# Patient Record
Sex: Female | Born: 1945
Health system: Southern US, Community
[De-identification: ages and names within clinical notes are randomized; demographics above are authoritative.]

## PROBLEM LIST (undated history)

## (undated) DIAGNOSIS — G8929 Other chronic pain: Secondary | ICD-10-CM

## (undated) DIAGNOSIS — R351 Nocturia: Secondary | ICD-10-CM

## (undated) DIAGNOSIS — J189 Pneumonia, unspecified organism: Secondary | ICD-10-CM

## (undated) DIAGNOSIS — I10 Essential (primary) hypertension: Secondary | ICD-10-CM

## (undated) DIAGNOSIS — Z862 Personal history of diseases of the blood and blood-forming organs and certain disorders involving the immune mechanism: Secondary | ICD-10-CM

## (undated) DIAGNOSIS — M549 Dorsalgia, unspecified: Secondary | ICD-10-CM

## (undated) DIAGNOSIS — R238 Other skin changes: Secondary | ICD-10-CM

## (undated) DIAGNOSIS — R011 Cardiac murmur, unspecified: Secondary | ICD-10-CM

## (undated) DIAGNOSIS — Z9889 Other specified postprocedural states: Secondary | ICD-10-CM

## (undated) DIAGNOSIS — R233 Spontaneous ecchymoses: Secondary | ICD-10-CM

## (undated) DIAGNOSIS — G4700 Insomnia, unspecified: Secondary | ICD-10-CM

## (undated) DIAGNOSIS — C801 Malignant (primary) neoplasm, unspecified: Secondary | ICD-10-CM

## (undated) DIAGNOSIS — F32A Depression, unspecified: Secondary | ICD-10-CM

## (undated) DIAGNOSIS — N189 Chronic kidney disease, unspecified: Secondary | ICD-10-CM

## (undated) DIAGNOSIS — R350 Frequency of micturition: Secondary | ICD-10-CM

## (undated) DIAGNOSIS — J329 Chronic sinusitis, unspecified: Secondary | ICD-10-CM

## (undated) DIAGNOSIS — E039 Hypothyroidism, unspecified: Secondary | ICD-10-CM

## (undated) DIAGNOSIS — M2540 Effusion, unspecified joint: Secondary | ICD-10-CM

## (undated) DIAGNOSIS — R112 Nausea with vomiting, unspecified: Secondary | ICD-10-CM

## (undated) DIAGNOSIS — E785 Hyperlipidemia, unspecified: Secondary | ICD-10-CM

## (undated) DIAGNOSIS — M1990 Unspecified osteoarthritis, unspecified site: Secondary | ICD-10-CM

## (undated) DIAGNOSIS — I959 Hypotension, unspecified: Secondary | ICD-10-CM

## (undated) DIAGNOSIS — M2550 Pain in unspecified joint: Secondary | ICD-10-CM

## (undated) HISTORY — DX: Hypotension, unspecified: I95.9

## (undated) HISTORY — PX: ABDOMINAL HYSTERECTOMY: SHX81

## (undated) HISTORY — PX: KIDNEY TRANSPLANT: SHX239

## (undated) HISTORY — PX: COLONOSCOPY: SHX174

## (undated) HISTORY — DX: Malignant (primary) neoplasm, unspecified: C80.1

## (undated) HISTORY — PX: BREAST BIOPSY: SHX20

## (undated) HISTORY — PX: OTHER SURGICAL HISTORY: SHX169

## (undated) HISTORY — PX: BACK SURGERY: SHX140

---

## 1998-10-02 ENCOUNTER — Encounter: Admission: RE | Admit: 1998-10-02 | Discharge: 1998-12-31 | Payer: Self-pay

## 1999-05-30 ENCOUNTER — Other Ambulatory Visit: Admission: RE | Admit: 1999-05-30 | Discharge: 1999-05-30 | Payer: Self-pay | Admitting: Family Medicine

## 2001-03-17 ENCOUNTER — Other Ambulatory Visit: Admission: RE | Admit: 2001-03-17 | Discharge: 2001-03-17 | Payer: Self-pay | Admitting: Family Medicine

## 2002-11-14 ENCOUNTER — Encounter: Admission: RE | Admit: 2002-11-14 | Discharge: 2002-11-14 | Payer: Self-pay | Admitting: Rheumatology

## 2002-11-14 ENCOUNTER — Encounter: Payer: Self-pay | Admitting: Rheumatology

## 2003-01-06 ENCOUNTER — Encounter: Admission: RE | Admit: 2003-01-06 | Discharge: 2003-04-06 | Payer: Self-pay | Admitting: Rheumatology

## 2003-04-03 ENCOUNTER — Other Ambulatory Visit: Admission: RE | Admit: 2003-04-03 | Discharge: 2003-04-03 | Payer: Self-pay | Admitting: *Deleted

## 2003-06-02 ENCOUNTER — Encounter: Admission: RE | Admit: 2003-06-02 | Discharge: 2003-06-02 | Payer: Self-pay | Admitting: Neurosurgery

## 2003-06-02 ENCOUNTER — Encounter: Payer: Self-pay | Admitting: Diagnostic Radiology

## 2003-06-02 ENCOUNTER — Encounter: Payer: Self-pay | Admitting: Neurosurgery

## 2003-06-20 ENCOUNTER — Encounter: Admission: RE | Admit: 2003-06-20 | Discharge: 2003-06-20 | Payer: Self-pay | Admitting: Neurosurgery

## 2003-06-20 ENCOUNTER — Encounter: Payer: Self-pay | Admitting: Neurosurgery

## 2003-07-07 ENCOUNTER — Encounter: Admission: RE | Admit: 2003-07-07 | Discharge: 2003-07-07 | Payer: Self-pay | Admitting: Neurosurgery

## 2003-07-07 ENCOUNTER — Encounter: Payer: Self-pay | Admitting: Neurosurgery

## 2003-11-17 ENCOUNTER — Inpatient Hospital Stay (HOSPITAL_COMMUNITY): Admission: RE | Admit: 2003-11-17 | Discharge: 2003-11-20 | Payer: Self-pay | Admitting: Neurosurgery

## 2004-07-02 ENCOUNTER — Other Ambulatory Visit: Admission: RE | Admit: 2004-07-02 | Discharge: 2004-07-02 | Payer: Self-pay | Admitting: Family Medicine

## 2005-05-20 ENCOUNTER — Other Ambulatory Visit: Admission: RE | Admit: 2005-05-20 | Discharge: 2005-05-20 | Payer: Self-pay | Admitting: Family Medicine

## 2006-04-22 ENCOUNTER — Inpatient Hospital Stay (HOSPITAL_COMMUNITY): Admission: RE | Admit: 2006-04-22 | Discharge: 2006-04-25 | Payer: Self-pay | Admitting: Orthopedic Surgery

## 2006-04-23 ENCOUNTER — Ambulatory Visit: Payer: Self-pay | Admitting: Physical Medicine & Rehabilitation

## 2006-04-25 ENCOUNTER — Inpatient Hospital Stay (HOSPITAL_COMMUNITY)
Admission: AD | Admit: 2006-04-25 | Discharge: 2006-04-28 | Payer: Self-pay | Admitting: Physical Medicine & Rehabilitation

## 2006-05-18 ENCOUNTER — Encounter: Admission: RE | Admit: 2006-05-18 | Discharge: 2006-07-02 | Payer: Self-pay | Admitting: Orthopedic Surgery

## 2006-07-03 ENCOUNTER — Other Ambulatory Visit: Admission: RE | Admit: 2006-07-03 | Discharge: 2006-07-03 | Payer: Self-pay | Admitting: Family Medicine

## 2006-10-17 ENCOUNTER — Emergency Department (HOSPITAL_COMMUNITY): Admission: EM | Admit: 2006-10-17 | Discharge: 2006-10-17 | Payer: Self-pay | Admitting: Emergency Medicine

## 2012-06-11 ENCOUNTER — Other Ambulatory Visit: Payer: Self-pay | Admitting: Family Medicine

## 2012-06-11 DIAGNOSIS — G629 Polyneuropathy, unspecified: Secondary | ICD-10-CM

## 2012-06-16 ENCOUNTER — Other Ambulatory Visit: Payer: Self-pay

## 2012-06-16 ENCOUNTER — Ambulatory Visit
Admission: RE | Admit: 2012-06-16 | Discharge: 2012-06-16 | Disposition: A | Discharge status: Home / Self Care | Payer: Medicare HMO | Source: Ambulatory Visit | Attending: Family Medicine | Admitting: Family Medicine

## 2012-06-16 DIAGNOSIS — G629 Polyneuropathy, unspecified: Secondary | ICD-10-CM

## 2012-08-04 ENCOUNTER — Other Ambulatory Visit: Payer: Self-pay | Admitting: Neurosurgery

## 2012-08-05 ENCOUNTER — Encounter (HOSPITAL_COMMUNITY): Payer: Self-pay

## 2012-08-09 NOTE — Pre-Procedure Instructions (Signed)
[place]  08/09/2012   Your procedure is scheduled on:  Fri, Nov 22 @ 3:39 PM  Report to [facility] at 12:30 PM.  Call this number if you have problems the morning of surgery: [phone]   Remember:   Do not eat food:After Midnight.    Take these medicines the morning of surgery with A SIP OF WATER: Levothyroxine(Synthroid),Metoprolol(Toprol),and Eye Drops(if needed)   Do not wear jewelry, make-up or nail polish.  Do not wear lotions, powders, or perfumes. You may wear deodorant.  Do not shave 48 hours prior to surgery.  Do not bring valuables to the hospital.  Contacts, dentures or bridgework may not be worn into surgery.  Leave suitcase in the car. After surgery it may be brought to your room.  For patients admitted to the hospital, checkout time is 11:00 AM the day of discharge.   Patients discharged the day of surgery will not be allowed to drive home.    Special Instructions: Shower using CHG 2 nights before surgery and the night before surgery.  If you shower the day of surgery use CHG.  Use special wash - you have one bottle of CHG for all showers.  You should use approximately 1/3 of the bottle for each shower.   Please read over the following fact sheets that you were given: Pain Booklet, Coughing and Deep Breathing, MRSA Information and Surgical Site Infection Prevention

## 2012-08-10 ENCOUNTER — Encounter (HOSPITAL_COMMUNITY)
Admission: RE | Admit: 2012-08-10 | Discharge: 2012-08-10 | Disposition: A | Discharge status: Home / Self Care | Payer: Medicare PPO | Source: Ambulatory Visit | Attending: Neurosurgery | Admitting: Neurosurgery

## 2012-08-10 ENCOUNTER — Encounter (HOSPITAL_COMMUNITY)
Admission: RE | Admit: 2012-08-10 | Discharge: 2012-08-10 | Disposition: A | Discharge status: Home / Self Care | Payer: Medicare PPO | Source: Ambulatory Visit | Attending: Anesthesiology | Admitting: Anesthesiology

## 2012-08-10 ENCOUNTER — Encounter (HOSPITAL_COMMUNITY): Payer: Self-pay

## 2012-08-10 HISTORY — DX: Frequency of micturition: R35.0

## 2012-08-10 HISTORY — DX: Chronic sinusitis, unspecified: J32.9

## 2012-08-10 HISTORY — DX: Essential (primary) hypertension: I10

## 2012-08-10 HISTORY — DX: Hyperlipidemia, unspecified: E78.5

## 2012-08-10 HISTORY — DX: Pain in unspecified joint: M25.50

## 2012-08-10 HISTORY — DX: Unspecified osteoarthritis, unspecified site: M19.90

## 2012-08-10 HISTORY — DX: Hypothyroidism, unspecified: E03.9

## 2012-08-10 HISTORY — DX: Other chronic pain: G89.29

## 2012-08-10 HISTORY — DX: Nausea with vomiting, unspecified: R11.2

## 2012-08-10 HISTORY — DX: Nocturia: R35.1

## 2012-08-10 HISTORY — DX: Dorsalgia, unspecified: M54.9

## 2012-08-10 HISTORY — DX: Other specified postprocedural states: Z98.890

## 2012-08-10 HISTORY — DX: Spontaneous ecchymoses: R23.3

## 2012-08-10 HISTORY — DX: Other skin changes: R23.8

## 2012-08-10 HISTORY — DX: Effusion, unspecified joint: M25.40

## 2012-08-10 HISTORY — DX: Insomnia, unspecified: G47.00

## 2012-08-10 LAB — BASIC METABOLIC PANEL
CO2: 30 mEq/L (ref 19–32)
Glucose, Bld: 89 mg/dL (ref 70–99)
Potassium: 3.9 mEq/L (ref 3.5–5.1)
Sodium: 134 mEq/L — ABNORMAL LOW (ref 135–145)

## 2012-08-10 LAB — CBC
Hemoglobin: 13.9 g/dL (ref 12.0–15.0)
MCH: 29.8 pg (ref 26.0–34.0)
MCV: 86.3 fL (ref 78.0–100.0)
RBC: 4.67 MIL/uL (ref 3.87–5.11)

## 2012-08-10 NOTE — Progress Notes (Signed)
Pt doesn't have a cardiologist  Denies ever having an echo/heart cath/stress test  MEdical MD is with [place] NP   Denies CXR within past yr  Unsure if ekg done in dec12-to request from [employer]

## 2012-08-12 MED ORDER — CEFAZOLIN SODIUM-DEXTROSE 2-3 GM-% IV SOLR
2.0000 g | INTRAVENOUS | Status: DC
  Filled 2012-08-12: qty 50

## 2012-08-12 NOTE — H&P (Signed)
NEUROSURGICAL CONSULTATION   Jourden Hopkins  [id number]  DOB:  May 10, 1946  August 04, 2012   HISTORY:     I previously did surgery on Mrs. Pribble who underwent L3-4 and L4-5 TLIF fusion in February of 2005.  She now comes in retired but with a severe low back pain and left hip pain. She notes left lower quadrant pain radiating to her left buttock.  She says this began 04/24/2012.  She says it initially was severe and after two weeks of rest and now is a bit better.  She is walking at the [facility].  She said she picked something up and felt a "wham".  She does feel she is a bit better although she does note some weakness.  She has a history of hypertension, hypothyroidism, elevated cholesterol, history of skin cancer.    REVIEW OF SYSTEMS:   A detailed Review of Systems sheet was reviewed with the patient.  Pertinent positives include: Ears, nose, throat, mouth-she notes ringing in her hears, nasal congestion/drainage, sinus problems, sinus headaches; Cardiovascular-high blood pressure, high cholesterol;  Musculoskeletal-back pain; Integumentary-skin cancer; Endocrine-thyroid disease.  All other systems are negative; this includes Constitutional symptoms, Respiratory, Gastrointestinal, Genitourinary, Musculoskeletal, Breast, Neurologic, Psychiatric, Hematologic/Lymphatic, and Allergic/Immunologic.    PAST MEDICAL HISTORY:          Current Medical Conditions:    As previously described.           Prior Operations and Hospitalizations:   As previously described.        Medications and Allergies:   She is intolerant of strong pain medicine which she says she does not tolerate well.  She is taking Losartan/Hydrochlorothiazide 100-12.5 q.d., Metoprolol 100 mg. q.d., Synthroid 88 mg. q.d., Pravastatin 40 mg. q.d. and Amitriptyline 50 mg. q.h.s.         Height and Weight:     5' 6.5" tall, 155 pounds.    FAMILY HISTORY:    Mother is 1 with high blood pressure.       SOCIAL HISTORY:    She denies smoking,  alcoholic beverages or substance abuse.    DIAGNOSTIC STUDIES:   An MRI was done of her lumbar spine which was done through [facility] on 06/16/2012 which shows a prominent impingement of the L1-2, moderate impingement at L2-3, and mild impingement of L5-S1 levels.  At the L1-2 level, there is severe left  subarticular lateral recess stenosis along with a paracentral disc protrusion with facet hypertrophy causing significant left-sided nerve root compression.    PHYSICAL EXAMINATION:          General Appearance:   On examination today, Mrs. Asebedo is a pleasant and cooperative woman in no acute distress.           Blood Pressure, Pulse:     142/82, heart rate 70 and regular, respirations 18.        HEENT - normocephalic, atraumatic.  The pupils are equal, round and reactive to light.  The extraocular muscles are intact.  Sclerae - white.  Conjunctiva - pink.  Oropharynx benign.  Uvula midline.        Neck - there are no masses, meningismus, deformities, tracheal deviation, jugular vein distention or carotid bruits.  There is normal cervical range of motion.  Spurlings' test is negative without reproducible radicular pain turning the patient's head to either side.  Lhermitte's sign is not present with axial compression.         Respiratory - there is normal respiratory  effort with good intercostal function.  Lungs are clear to auscultation.  There are no rales, rhonchi or wheezes.         Cardiovascular - the heart has regular rate and rhythm to auscultation.  No murmurs are appreciated.  There is no extremity edema, cyanosis or clubbing.  There are palpable pedal pulses.         Abdomen - soft, nontender, no hepatosplenomegaly appreciated or masses.  There are active bowel sounds.  No guarding or rebound.         Musculoskeletal Examination - she has left sciatic notch discomfort to palpation.  She has positive reverse straight leg raising on the left.  She is able to touch her  toes and stand on her heels and toes.  She has a decreased ability to squat on the left lower extremity.    NEUROLOGICAL EXAMINATION: The patient is oriented to time, person and place and has good recall of both recent and remote memory with normal attention span and concentration.  The patient speaks with clear and fluent speech and exhibits normal language function and appropriate fund of knowledge.         Cranial Nerve Examination - pupils are equal, round and reactive to light.  Extraocular movements are full.  Visual fields are full to confrontational testing.  Facial sensation and facial movement are symmetric and intact.  Hearing is intact to finger rub.  Palate is upgoing.  Shoulder shrug is symmetric.  Tongue protrudes in the midline.         Motor Examination - motor strength is 5/5 in the bilateral deltoids, biceps, triceps, handgrips, wrist extensors, interosseous.  In the lower extremities motor strength is 5/5 in hip flexion, extension,   Gowri Goodin  [id number]  DOB:  [birth year]   August 04, 2012 Page 3   quadriceps, hamstrings, plantar flexion, dorsiflexion and extensor hallucis longus. She has decreased hip flexion at 4/5 on the left.         Sensory Examination - she has decreased pin sensation left L1 distribution.         Deep Tendon Reflexes - 2 in the biceps, triceps, and brachioradialis, 2 in the right knee and at the left knee, 2 in the ankles.  The great toes are downgoing to plantar stimulation.  No pathologic reflexes.          Cerebellar Examination - normal coordination in upper and lower extremities and normal rapid alternating movements.  Romberg test is negative.    IMPRESSION AND RECOMMENDATIONS: Lynnmarie Razzano is a 66 year old woman with low back and left leg pain with left L2 radiculopathy with significant hip flexor weakness.  She has a large disc herniation at L1-2 on the left.  I have recommended she undergo surgery. She has done well with her lumbar  fusion and appears to be well healed from that surgery.  Radiographs show well positioned interbody grafts and pedicle screw fixation without complicating features.    Overall I think she is doing well from the standpoint of her lumbar fusion but she has significant problem with L2 radiculopathy on the left.  I have recommended she undergo left L1-2 microdiskectomy.  We will set this up on an expedited basis.  Risks and benefits were discussed with the patient.  She wishes to proceed.  I reviewed the studies with the patient and went over her physical examination.  I reviewed surgical models and discussed the typical hospital course and operative and postoperative course  and the potential risks and benefits of surgery.  The risks of surgery were discussed in detail and include, but are not limited to, the risks of anesthesia, blood loss and the possibility of hemorrhage, infection, damage to nerves, damage to blood vessels, injury to the lumbar nerve root causing either temporary or permanent leg pain, numbness, weakness.  There is potential for spinal fluid leak from dural tear.  There is the potential for post-laminectomy spondylolisthesis, recurrent disc ruptured quoted at approximately 10%, failure to relieve pain, worsening of pain, need for further surgery.      [facility]       Marchia Meiers. Vertell Limber, M.D.   JDS:gde   cc: Mercie Eon, NP

## 2012-08-13 ENCOUNTER — Inpatient Hospital Stay (HOSPITAL_COMMUNITY): Payer: Medicare PPO

## 2012-08-13 ENCOUNTER — Encounter (HOSPITAL_COMMUNITY): Payer: Self-pay | Admitting: Anesthesiology

## 2012-08-13 ENCOUNTER — Inpatient Hospital Stay (HOSPITAL_COMMUNITY): Payer: Medicare PPO | Admitting: Anesthesiology

## 2012-08-13 ENCOUNTER — Observation Stay (HOSPITAL_COMMUNITY)
Admission: RE | Admit: 2012-08-13 | Discharge: 2012-08-14 | Disposition: A | Discharge status: Home / Self Care | Payer: Medicare PPO | Source: Ambulatory Visit | Attending: Neurosurgery | Admitting: Neurosurgery

## 2012-08-13 ENCOUNTER — Encounter (HOSPITAL_COMMUNITY): Payer: Self-pay | Admitting: *Deleted

## 2012-08-13 ENCOUNTER — Encounter (HOSPITAL_COMMUNITY)
Admission: RE | Disposition: A | Discharge status: Home / Self Care | Payer: Self-pay | Source: Ambulatory Visit | Attending: Neurosurgery

## 2012-08-13 DIAGNOSIS — M5137 Other intervertebral disc degeneration, lumbosacral region: Secondary | ICD-10-CM | POA: Insufficient documentation

## 2012-08-13 DIAGNOSIS — M51379 Other intervertebral disc degeneration, lumbosacral region without mention of lumbar back pain or lower extremity pain: Secondary | ICD-10-CM | POA: Insufficient documentation

## 2012-08-13 DIAGNOSIS — I10 Essential (primary) hypertension: Secondary | ICD-10-CM | POA: Insufficient documentation

## 2012-08-13 DIAGNOSIS — E039 Hypothyroidism, unspecified: Secondary | ICD-10-CM | POA: Insufficient documentation

## 2012-08-13 DIAGNOSIS — M5126 Other intervertebral disc displacement, lumbar region: Principal | ICD-10-CM | POA: Insufficient documentation

## 2012-08-13 DIAGNOSIS — Z9889 Other specified postprocedural states: Secondary | ICD-10-CM

## 2012-08-13 HISTORY — PX: LUMBAR LAMINECTOMY/DECOMPRESSION MICRODISCECTOMY: SHX5026

## 2012-08-13 SURGERY — LUMBAR LAMINECTOMY/DECOMPRESSION MICRODISCECTOMY 1 LEVEL
Anesthesia: General | Site: Back | Laterality: Left | Wound class: Clean

## 2012-08-13 MED ORDER — HEMOSTATIC AGENTS (NO CHARGE) OPTIME
TOPICAL | Status: DC | PRN
  Administered 2012-08-13: 1 via TOPICAL

## 2012-08-13 MED ORDER — LIDOCAINE-EPINEPHRINE 1 %-1:100000 IJ SOLN
INTRAMUSCULAR | Status: DC | PRN
  Administered 2012-08-13: 3 mL

## 2012-08-13 MED ORDER — SODIUM CHLORIDE 0.9 % IJ SOLN: 3.0000 mL | INTRAMUSCULAR | Status: DC | PRN

## 2012-08-13 MED ORDER — PROMETHAZINE HCL 25 MG/ML IJ SOLN: 6.2500 mg | INTRAMUSCULAR | Status: DC | PRN

## 2012-08-13 MED ORDER — FENTANYL CITRATE 0.05 MG/ML IJ SOLN
INTRAMUSCULAR | Status: AC
  Filled 2012-08-13: qty 2

## 2012-08-13 MED ORDER — LOSARTAN POTASSIUM 50 MG PO TABS
100.0000 mg | ORAL_TABLET | Freq: Every day | ORAL | Status: DC
  Administered 2012-08-13: 100 mg via ORAL
  Filled 2012-08-13 (×2): qty 2

## 2012-08-13 MED ORDER — SALINE SPRAY 0.65 % NA SOLN
1.0000 | Freq: Every day | NASAL | Status: DC | PRN
  Filled 2012-08-13: qty 44

## 2012-08-13 MED ORDER — POLYETHYLENE GLYCOL 3350 17 G PO PACK
17.0000 g | PACK | Freq: Every day | ORAL | Status: DC | PRN
  Filled 2012-08-13: qty 1

## 2012-08-13 MED ORDER — DIAZEPAM 5 MG PO TABS: 5.0000 mg | ORAL_TABLET | Freq: Four times a day (QID) | ORAL | Status: DC | PRN

## 2012-08-13 MED ORDER — LUTEIN 20 MG PO CAPS: 1.0000 | ORAL_CAPSULE | Freq: Every day | ORAL | Status: DC

## 2012-08-13 MED ORDER — HYDROCODONE-ACETAMINOPHEN 5-325 MG PO TABS
1.0000 | ORAL_TABLET | ORAL | Status: DC | PRN
  Administered 2012-08-13 – 2012-08-14 (×2): 1 via ORAL
  Filled 2012-08-13 (×2): qty 1

## 2012-08-13 MED ORDER — SODIUM CHLORIDE 0.9 % IV SOLN
INTRAVENOUS | Status: AC
  Filled 2012-08-13: qty 500

## 2012-08-13 MED ORDER — CEFAZOLIN SODIUM-DEXTROSE 2-3 GM-% IV SOLR
INTRAVENOUS | Status: DC | PRN
  Administered 2012-08-13: 2 g via INTRAVENOUS

## 2012-08-13 MED ORDER — DOCUSATE SODIUM 100 MG PO CAPS
100.0000 mg | ORAL_CAPSULE | Freq: Two times a day (BID) | ORAL | Status: DC
  Administered 2012-08-13: 100 mg via ORAL
  Filled 2012-08-13: qty 1

## 2012-08-13 MED ORDER — SALINE 0.9 % NA AERS: 1.0000 | INHALATION_SPRAY | Freq: Every day | NASAL | Status: DC | PRN

## 2012-08-13 MED ORDER — FENTANYL CITRATE 0.05 MG/ML IJ SOLN
INTRAMUSCULAR | Status: DC | PRN
  Administered 2012-08-13: 50 ug via INTRAVENOUS
  Administered 2012-08-13 (×2): 100 ug via INTRAVENOUS

## 2012-08-13 MED ORDER — HYDROCHLOROTHIAZIDE 12.5 MG PO CAPS
12.5000 mg | ORAL_CAPSULE | Freq: Every day | ORAL | Status: DC
  Administered 2012-08-13: 12.5 mg via ORAL
  Filled 2012-08-13 (×2): qty 1

## 2012-08-13 MED ORDER — LACTATED RINGERS IV SOLN
INTRAVENOUS | Status: DC | PRN
  Administered 2012-08-13 (×3): via INTRAVENOUS

## 2012-08-13 MED ORDER — PROPOFOL 10 MG/ML IV BOLUS
INTRAVENOUS | Status: DC | PRN
  Administered 2012-08-13: 150 mg via INTRAVENOUS

## 2012-08-13 MED ORDER — SIMVASTATIN 20 MG PO TABS
20.0000 mg | ORAL_TABLET | Freq: Every day | ORAL | Status: DC
  Filled 2012-08-13: qty 1

## 2012-08-13 MED ORDER — ZOLPIDEM TARTRATE 5 MG PO TABS: 5.0000 mg | ORAL_TABLET | Freq: Every evening | ORAL | Status: DC | PRN

## 2012-08-13 MED ORDER — NAPROXEN SODIUM 220 MG PO TABS: 220.0000 mg | ORAL_TABLET | Freq: Two times a day (BID) | ORAL | Status: DC | PRN

## 2012-08-13 MED ORDER — BISACODYL 10 MG RE SUPP: 10.0000 mg | Freq: Every day | RECTAL | Status: DC | PRN

## 2012-08-13 MED ORDER — SODIUM CHLORIDE 0.9 % IR SOLN
Status: DC | PRN
  Administered 2012-08-13: 16:00:00

## 2012-08-13 MED ORDER — MORPHINE SULFATE 2 MG/ML IJ SOLN: 1.0000 mg | INTRAMUSCULAR | Status: DC | PRN

## 2012-08-13 MED ORDER — METHYLPREDNISOLONE ACETATE 80 MG/ML IJ SUSP
INTRAMUSCULAR | Status: DC | PRN
  Administered 2012-08-13: 80 mg

## 2012-08-13 MED ORDER — MIDAZOLAM HCL 5 MG/5ML IJ SOLN
INTRAMUSCULAR | Status: DC | PRN
  Administered 2012-08-13: 2 mg via INTRAVENOUS

## 2012-08-13 MED ORDER — ONDANSETRON HCL 4 MG/2ML IJ SOLN: 4.0000 mg | INTRAMUSCULAR | Status: DC | PRN

## 2012-08-13 MED ORDER — FLEET ENEMA 7-19 GM/118ML RE ENEM
1.0000 | ENEMA | Freq: Once | RECTAL | Status: AC | PRN
  Filled 2012-08-13: qty 1

## 2012-08-13 MED ORDER — HYDROMORPHONE HCL PF 1 MG/ML IJ SOLN: 0.2500 mg | INTRAMUSCULAR | Status: DC | PRN

## 2012-08-13 MED ORDER — FENTANYL CITRATE 0.05 MG/ML IJ SOLN
INTRAMUSCULAR | Status: DC | PRN
  Administered 2012-08-13: 100 ug via INTRAVENOUS

## 2012-08-13 MED ORDER — GARLIC 2000 MG PO CAPS: 1.0000 | ORAL_CAPSULE | Freq: Every day | ORAL | Status: DC

## 2012-08-13 MED ORDER — SENNA 8.6 MG PO TABS
1.0000 | ORAL_TABLET | Freq: Two times a day (BID) | ORAL | Status: DC
  Administered 2012-08-13: 8.6 mg via ORAL
  Filled 2012-08-13 (×3): qty 1

## 2012-08-13 MED ORDER — MUPIROCIN 2 % EX OINT
TOPICAL_OINTMENT | Freq: Two times a day (BID) | CUTANEOUS | Status: DC
  Administered 2012-08-13: 22:00:00 via NASAL

## 2012-08-13 MED ORDER — ONDANSETRON HCL 4 MG/2ML IJ SOLN
INTRAMUSCULAR | Status: DC | PRN
  Administered 2012-08-13: 4 mg via INTRAVENOUS

## 2012-08-13 MED ORDER — LIDOCAINE HCL (CARDIAC) 20 MG/ML IV SOLN
INTRAVENOUS | Status: DC | PRN
  Administered 2012-08-13: 40 mg via INTRAVENOUS

## 2012-08-13 MED ORDER — LYSINE 500 MG PO CAPS: 1.0000 | ORAL_CAPSULE | Freq: Every day | ORAL | Status: DC

## 2012-08-13 MED ORDER — ROCURONIUM BROMIDE 100 MG/10ML IV SOLN
INTRAVENOUS | Status: DC | PRN
  Administered 2012-08-13: 50 mg via INTRAVENOUS

## 2012-08-13 MED ORDER — CEFAZOLIN SODIUM 1-5 GM-% IV SOLN
1.0000 g | Freq: Three times a day (TID) | INTRAVENOUS | Status: AC
  Administered 2012-08-13 – 2012-08-14 (×2): 1 g via INTRAVENOUS
  Filled 2012-08-13 (×2): qty 50

## 2012-08-13 MED ORDER — ASPIRIN EC 81 MG PO TBEC
81.0000 mg | DELAYED_RELEASE_TABLET | Freq: Every day | ORAL | Status: DC
  Administered 2012-08-13: 81 mg via ORAL
  Filled 2012-08-13 (×2): qty 1

## 2012-08-13 MED ORDER — LEVOTHYROXINE SODIUM 88 MCG PO TABS
88.0000 ug | ORAL_TABLET | Freq: Every day | ORAL | Status: DC
  Administered 2012-08-14: 88 ug via ORAL
  Filled 2012-08-13 (×2): qty 1

## 2012-08-13 MED ORDER — OXYCODONE HCL 5 MG/5ML PO SOLN: 5.0000 mg | Freq: Once | ORAL | Status: DC | PRN

## 2012-08-13 MED ORDER — THROMBIN 5000 UNITS EX KIT
PACK | CUTANEOUS | Status: DC | PRN
  Administered 2012-08-13 (×2): 5000 [IU] via TOPICAL

## 2012-08-13 MED ORDER — AMITRIPTYLINE HCL 50 MG PO TABS
50.0000 mg | ORAL_TABLET | Freq: Every day | ORAL | Status: DC
  Administered 2012-08-13: 50 mg via ORAL
  Filled 2012-08-13 (×2): qty 1

## 2012-08-13 MED ORDER — ACETAMINOPHEN 650 MG RE SUPP: 650.0000 mg | RECTAL | Status: DC | PRN

## 2012-08-13 MED ORDER — MEPERIDINE HCL 25 MG/ML IJ SOLN: 6.2500 mg | INTRAMUSCULAR | Status: DC | PRN

## 2012-08-13 MED ORDER — KCL IN DEXTROSE-NACL 20-5-0.45 MEQ/L-%-% IV SOLN
INTRAVENOUS | Status: DC
  Filled 2012-08-13 (×3): qty 1000

## 2012-08-13 MED ORDER — ACETAMINOPHEN 325 MG PO TABS: 650.0000 mg | ORAL_TABLET | ORAL | Status: DC | PRN

## 2012-08-13 MED ORDER — NAPROXEN 250 MG PO TABS
250.0000 mg | ORAL_TABLET | Freq: Two times a day (BID) | ORAL | Status: DC | PRN
  Filled 2012-08-13: qty 2

## 2012-08-13 MED ORDER — SODIUM CHLORIDE 0.9 % IJ SOLN
3.0000 mL | Freq: Two times a day (BID) | INTRAMUSCULAR | Status: DC
  Administered 2012-08-13: 3 mL via INTRAVENOUS

## 2012-08-13 MED ORDER — METOPROLOL SUCCINATE ER 100 MG PO TB24
100.0000 mg | ORAL_TABLET | Freq: Every day | ORAL | Status: DC
  Filled 2012-08-13: qty 1

## 2012-08-13 MED ORDER — BACITRACIN 50000 UNITS IM SOLR
INTRAMUSCULAR | Status: AC
  Filled 2012-08-13: qty 1

## 2012-08-13 MED ORDER — OXYCODONE HCL 5 MG PO TABS: 5.0000 mg | ORAL_TABLET | Freq: Once | ORAL | Status: DC | PRN

## 2012-08-13 MED ORDER — MIDAZOLAM HCL 2 MG/2ML IJ SOLN: 0.5000 mg | Freq: Once | INTRAMUSCULAR | Status: DC | PRN

## 2012-08-13 MED ORDER — BUPIVACAINE HCL (PF) 0.5 % IJ SOLN
INTRAMUSCULAR | Status: DC | PRN
  Administered 2012-08-13: 3 mL

## 2012-08-13 MED ORDER — DEXAMETHASONE SODIUM PHOSPHATE 4 MG/ML IJ SOLN
INTRAMUSCULAR | Status: DC | PRN
  Administered 2012-08-13: 4 mg via INTRAVENOUS

## 2012-08-13 MED ORDER — MENTHOL 3 MG MT LOZG: 1.0000 | LOZENGE | OROMUCOSAL | Status: DC | PRN

## 2012-08-13 MED ORDER — PHENOL 1.4 % MT LIQD: 1.0000 | OROMUCOSAL | Status: DC | PRN

## 2012-08-13 MED ORDER — CO Q-10 200 MG PO CAPS: 1.0000 | ORAL_CAPSULE | Freq: Every day | ORAL | Status: DC

## 2012-08-13 MED ORDER — LOSARTAN POTASSIUM-HCTZ 100-12.5 MG PO TABS: 1.0000 | ORAL_TABLET | Freq: Every day | ORAL | Status: DC

## 2012-08-13 SURGICAL SUPPLY — 53 items
BAG DECANTER FOR FLEXI CONT (MISCELLANEOUS) ×2 IMPLANT
BENZOIN TINCTURE PRP APPL 2/3 (GAUZE/BANDAGES/DRESSINGS) ×2 IMPLANT
BIT DRILL NEURO 2X3.1 SFT TUCH (MISCELLANEOUS) ×1 IMPLANT
BLADE SURG ROTATE 9660 (MISCELLANEOUS) IMPLANT
BUR ROUND FLUTED 5 RND (BURR) ×2 IMPLANT
CANISTER SUCTION 2500CC (MISCELLANEOUS) ×2 IMPLANT
CLOTH BEACON ORANGE TIMEOUT ST (SAFETY) ×2 IMPLANT
CONT SPEC 4OZ CLIKSEAL STRL BL (MISCELLANEOUS) ×2 IMPLANT
DERMABOND ADVANCED (GAUZE/BANDAGES/DRESSINGS) ×1
DERMABOND ADVANCED .7 DNX12 (GAUZE/BANDAGES/DRESSINGS) ×1 IMPLANT
DRAPE LAPAROTOMY 100X72X124 (DRAPES) ×2 IMPLANT
DRAPE MICROSCOPE LEICA (MISCELLANEOUS) ×2 IMPLANT
DRAPE POUCH INSTRU U-SHP 10X18 (DRAPES) ×2 IMPLANT
DRAPE SURG 17X23 STRL (DRAPES) ×2 IMPLANT
DRESSING TELFA 8X3 (GAUZE/BANDAGES/DRESSINGS) IMPLANT
DRILL NEURO 2X3.1 SOFT TOUCH (MISCELLANEOUS) ×2
DURAPREP 26ML APPLICATOR (WOUND CARE) ×2 IMPLANT
ELECT REM PT RETURN 9FT ADLT (ELECTROSURGICAL) ×2
ELECTRODE REM PT RTRN 9FT ADLT (ELECTROSURGICAL) ×1 IMPLANT
GAUZE SPONGE 4X4 16PLY XRAY LF (GAUZE/BANDAGES/DRESSINGS) IMPLANT
GLOVE BIO SURGEON STRL SZ8 (GLOVE) ×4 IMPLANT
GLOVE BIOGEL PI IND STRL 8 (GLOVE) ×1 IMPLANT
GLOVE BIOGEL PI IND STRL 8.5 (GLOVE) ×1 IMPLANT
GLOVE BIOGEL PI INDICATOR 8 (GLOVE) ×1
GLOVE BIOGEL PI INDICATOR 8.5 (GLOVE) ×1
GLOVE ECLIPSE 8.0 STRL XLNG CF (GLOVE) ×2 IMPLANT
GLOVE EXAM NITRILE LRG STRL (GLOVE) IMPLANT
GLOVE EXAM NITRILE MD LF STRL (GLOVE) IMPLANT
GLOVE EXAM NITRILE XL STR (GLOVE) IMPLANT
GLOVE EXAM NITRILE XS STR PU (GLOVE) IMPLANT
GOWN BRE IMP SLV AUR LG STRL (GOWN DISPOSABLE) ×2 IMPLANT
GOWN BRE IMP SLV AUR XL STRL (GOWN DISPOSABLE) ×4 IMPLANT
GOWN STRL REIN 2XL LVL4 (GOWN DISPOSABLE) ×2 IMPLANT
KIT BASIN OR (CUSTOM PROCEDURE TRAY) ×2 IMPLANT
KIT ROOM TURNOVER OR (KITS) ×2 IMPLANT
NEEDLE HYPO 18GX1.5 BLUNT FILL (NEEDLE) ×2 IMPLANT
NEEDLE HYPO 25X1 1.5 SAFETY (NEEDLE) ×2 IMPLANT
NS IRRIG 1000ML POUR BTL (IV SOLUTION) ×2 IMPLANT
PACK LAMINECTOMY NEURO (CUSTOM PROCEDURE TRAY) ×2 IMPLANT
PAD ARMBOARD 7.5X6 YLW CONV (MISCELLANEOUS) ×6 IMPLANT
RUBBERBAND STERILE (MISCELLANEOUS) ×4 IMPLANT
SPONGE GAUZE 4X4 12PLY (GAUZE/BANDAGES/DRESSINGS) IMPLANT
SPONGE SURGIFOAM ABS GEL SZ50 (HEMOSTASIS) ×2 IMPLANT
STRIP CLOSURE SKIN 1/2X4 (GAUZE/BANDAGES/DRESSINGS) ×2 IMPLANT
SUT VIC AB 0 CT1 18XCR BRD8 (SUTURE) ×1 IMPLANT
SUT VIC AB 0 CT1 8-18 (SUTURE) ×1
SUT VIC AB 2-0 CT1 18 (SUTURE) ×2 IMPLANT
SUT VIC AB 3-0 SH 8-18 (SUTURE) ×2 IMPLANT
SYR 20ML ECCENTRIC (SYRINGE) ×2 IMPLANT
SYR 5ML LL (SYRINGE) ×2 IMPLANT
TOWEL OR 17X24 6PK STRL BLUE (TOWEL DISPOSABLE) ×2 IMPLANT
TOWEL OR 17X26 10 PK STRL BLUE (TOWEL DISPOSABLE) ×2 IMPLANT
WATER STERILE IRR 1000ML POUR (IV SOLUTION) ×2 IMPLANT

## 2012-08-13 NOTE — Anesthesia Postprocedure Evaluation (Signed)
   Anesthesia Post-op Note  Patient: Jasmine White  Procedure(s) Performed: Procedure(s) (LRB) with comments: LUMBAR LAMINECTOMY/DECOMPRESSION MICRODISCECTOMY 1 LEVEL (Left) - Left lumbar one-two Microdiskectomy  Patient Location: PACU  Anesthesia Type:General  Level of Consciousness: awake, alert , oriented and patient cooperative  Airway and Oxygen Therapy: Patient Spontanous Breathing and Patient connected to nasal cannula oxygen  Post-op Pain: none  Post-op Assessment: Post-op Vital signs reviewed, Patient's Cardiovascular Status Stable, Respiratory Function Stable, Patent Airway, No signs of Nausea or vomiting and Pain level controlled  Post-op Vital Signs: Reviewed and stable  Complications: No apparent anesthesia complications

## 2012-08-13 NOTE — Transfer of Care (Signed)
Immediate Anesthesia Transfer of Care Note  Patient: Jasmine White  Procedure(s) Performed: Procedure(s) (LRB) with comments: LUMBAR LAMINECTOMY/DECOMPRESSION MICRODISCECTOMY 1 LEVEL (Left) - Left lumbar one-two Microdiskectomy  Patient Location: PACU  Anesthesia Type:General  Level of Consciousness: sedated  Airway & Oxygen Therapy: Patient Spontanous Breathing and Patient connected to nasal cannula oxygen  Post-op Assessment: Report given to PACU RN and Post -op Vital signs reviewed and stable  Post vital signs: Reviewed and stable  Complications: No apparent anesthesia complications

## 2012-08-13 NOTE — Brief Op Note (Signed)
08/13/2012  4:43 PM  PATIENT:  Jasmine White  66 y.o. female  PRE-OPERATIVE DIAGNOSIS:  Lumbar herniated nucleus pulosus without myelopathy L 1/2 Left, Lumbar spondylosis, Lumbar stenosis, Lumbar radiculopathy  POST-OPERATIVE DIAGNOSIS:  Lumbar herniated nucleus pulosus without myelopathy L 1/2 Left, Lumbar spondylosis, Lumbar stenosis, Lumbar radiculopathy  PROCEDURE:  Procedure(s) (LRB) with comments: LUMBAR LAMINECTOMY/DECOMPRESSION MICRODISCECTOMY 1 LEVEL (Left) - Left lumbar one-two Microdiskectomy with microdissection  SURGEON:  Surgeon(s) and Role:    * Erline Levine, MD - Primary    * Eustace Moore, MD - Assisting  PHYSICIAN ASSISTANT:   ASSISTANTS: Poteat, RN   ANESTHESIA:   general  EBL:  Total I/O In: 2000 [I.V.:2000] Out: 30 [Blood:30]  BLOOD ADMINISTERED:none  DRAINS: none   LOCAL MEDICATIONS USED:  LIDOCAINE   SPECIMEN:  No Specimen  DISPOSITION OF SPECIMEN:  N/A  COUNTS:  YES  TOURNIQUET:  * No tourniquets in log *  DICTATION: Patient has a large L1/2 disc rupture on the left with significant left leg weakness. It was elected to take her to surgery for left L 1/2 microdiscectomy.  Procedure: Patient was brought to the operating room and following the smooth and uncomplicated induction of general endotracheal anesthesia she was placed in a prone position on the Wilson frame. Low back was prepped and draped in the usual sterile fashion with DuraPrep. An 18 gauge needle was placed and level confirmed on preoperative X ray. Area of planned incision was infiltrated with local lidocaine. Incision was made in the midline and carried to the lumbodorsal fascia which was incised on the left side of midline. Subperiosteal dissection was performed exposing what was felt to be L12 level. Intraoperative x-ray demonstrated marker probe at L12.  A hemi-semi-laminectomy of L1 was performed a high-speed drill and completed with Kerrison rongeurs and a generous foraminotomy  was performed overlying the superior aspect of the L2 lamina. Ligamentum flavum was detached and removed in a piecemeal fashion and the L2 nerve root was decompressed laterally with removal of the superior aspect of the facet and ligamentum causing nerve root compression. This was done with the operating microscope. The thecal sac was mobilized medially, exposing a free fragment of herniated disc material. This exposed a large amount of soft disc material and a free fragment of herniated disc material. Multiple fragments were removed and these extended into the interspace which appeared to be quite soft with a disrupted annulus overlying the interspace. As a result it was elected to further decompress the interspace and remove loose disc material and this was done with a variety Epstein curettes and pituitary rongeurs. The redundant annulus was also removed with 2 mm Kerrison rongeur. At this point it was felt that all neural elements were well decompressed and there was no evidence of residual loose disc material within the interspace. Hemostasis was assured with bipolar electrocautery and the interspace was irrigated with Depo-Medrol and fentanyl. The lumbodorsal fascia was closed with 0 Vicryl sutures the subcutaneous tissues reapproximated 2-0 Vicryl inverted sutures and the skin edges were reapproximated with 3-0 Vicryl subcuticular stitch. The wound was dressed with Dermabond. Patient was extubated in the operating room and taken to recovery in stable and satisfactory condition having tolerated his operation well counts were correct at the end of the case.  PLAN OF CARE: Admit for overnight observation  PATIENT DISPOSITION:  PACU - hemodynamically stable.   Delay start of Pharmacological VTE agent (>24hrs) due to surgical blood loss or risk of bleeding: yes

## 2012-08-13 NOTE — Op Note (Signed)
08/13/2012  4:43 PM  PATIENT:  Jasmine White  66 y.o. female  PRE-OPERATIVE DIAGNOSIS:  Lumbar herniated nucleus pulosus without myelopathy L 1/2 Left, Lumbar spondylosis, Lumbar stenosis, Lumbar radiculopathy  POST-OPERATIVE DIAGNOSIS:  Lumbar herniated nucleus pulosus without myelopathy L 1/2 Left, Lumbar spondylosis, Lumbar stenosis, Lumbar radiculopathy  PROCEDURE:  Procedure(s) (LRB) with comments: LUMBAR LAMINECTOMY/DECOMPRESSION MICRODISCECTOMY 1 LEVEL (Left) - Left lumbar one-two Microdiskectomy with microdissection  SURGEON:  Surgeon(s) and Role:    * Erline Levine, MD - Primary    * Eustace Moore, MD - Assisting  PHYSICIAN ASSISTANT:   ASSISTANTS: Poteat, RN   ANESTHESIA:   general  EBL:  Total I/O In: 2000 [I.V.:2000] Out: 30 [Blood:30]  BLOOD ADMINISTERED:none  DRAINS: none   LOCAL MEDICATIONS USED:  LIDOCAINE   SPECIMEN:  No Specimen  DISPOSITION OF SPECIMEN:  N/A  COUNTS:  YES  TOURNIQUET:  * No tourniquets in log *  DICTATION: Patient has a large L1/2 disc rupture on the left with significant left leg weakness. It was elected to take her to surgery for left L 1/2 microdiscectomy.  Procedure: Patient was brought to the operating room and following the smooth and uncomplicated induction of general endotracheal anesthesia she was placed in a prone position on the Wilson frame. Low back was prepped and draped in the usual sterile fashion with DuraPrep. An 18 gauge needle was placed and level confirmed on preoperative X ray. Area of planned incision was infiltrated with local lidocaine. Incision was made in the midline and carried to the lumbodorsal fascia which was incised on the left side of midline. Subperiosteal dissection was performed exposing what was felt to be L12 level. Intraoperative x-ray demonstrated marker probe at L12.  A hemi-semi-laminectomy of L1 was performed a high-speed drill and completed with Kerrison rongeurs and a generous foraminotomy  was performed overlying the superior aspect of the L2 lamina. Ligamentum flavum was detached and removed in a piecemeal fashion and the L2 nerve root was decompressed laterally with removal of the superior aspect of the facet and ligamentum causing nerve root compression. This was done with the operating microscope. The thecal sac was mobilized medially, exposing a free fragment of herniated disc material. This exposed a large amount of soft disc material and a free fragment of herniated disc material. Multiple fragments were removed and these extended into the interspace which appeared to be quite soft with a disrupted annulus overlying the interspace. As a result it was elected to further decompress the interspace and remove loose disc material and this was done with a variety Epstein curettes and pituitary rongeurs. The redundant annulus was also removed with 2 mm Kerrison rongeur. At this point it was felt that all neural elements were well decompressed and there was no evidence of residual loose disc material within the interspace. Hemostasis was assured with bipolar electrocautery and the interspace was irrigated with Depo-Medrol and fentanyl. The lumbodorsal fascia was closed with 0 Vicryl sutures the subcutaneous tissues reapproximated 2-0 Vicryl inverted sutures and the skin edges were reapproximated with 3-0 Vicryl subcuticular stitch. The wound was dressed with Dermabond. Patient was extubated in the operating room and taken to recovery in stable and satisfactory condition having tolerated his operation well counts were correct at the end of the case.  PLAN OF CARE: Admit for overnight observation  PATIENT DISPOSITION:  PACU - hemodynamically stable.   Delay start of Pharmacological VTE agent (>24hrs) due to surgical blood loss or risk of bleeding: yes

## 2012-08-13 NOTE — Progress Notes (Signed)
Notified Dr. Glennon Mac of pt's chest xray. She stated she will let Dr.Stern know.

## 2012-08-13 NOTE — Anesthesia Procedure Notes (Signed)
Procedure Name: Intubation Date/Time: 08/13/2012 3:12 PM Performed by: Marinda Elk A Pre-anesthesia Checklist: Patient identified, Timeout performed, Emergency Drugs available, Suction available and Patient being monitored Patient Re-evaluated:Patient Re-evaluated prior to inductionOxygen Delivery Method: Circle system utilized Preoxygenation: Pre-oxygenation with 100% oxygen Intubation Type: IV induction Ventilation: Mask ventilation without difficulty Laryngoscope Size: Mac and 3 Grade View: Grade II Tube type: Oral Tube size: 7.5 mm Number of attempts: 1 Airway Equipment and Method: Stylet Placement Confirmation: ETT inserted through vocal cords under direct vision,  positive ETCO2 and breath sounds checked- equal and bilateral Secured at: 21 cm Tube secured with: Tape Dental Injury: Teeth and Oropharynx as per pre-operative assessment

## 2012-08-13 NOTE — Anesthesia Preprocedure Evaluation (Addendum)
Anesthesia Evaluation  Patient identified by MRN, date of birth, ID band Patient awake    Reviewed: Allergy & Precautions, H&P , NPO status , Patient's Chart, lab work & pertinent test results, reviewed documented beta blocker date and time   History of Anesthesia Complications (+) PONV  Airway Mallampati: II TM Distance: >3 FB Neck ROM: Full    Dental No notable dental hx. (+) Teeth Intact and Dental Advisory Given   Pulmonary Recent URI , Resolved,  breath sounds clear to auscultation  Pulmonary exam normal       Cardiovascular hypertension, Pt. on medications and Pt. on home beta blockers Rhythm:Regular Rate:Normal     Neuro/Psych Chronic back pain    GI/Hepatic negative GI ROS, Neg liver ROS,   Endo/Other  Hypothyroidism (on replacement)   Renal/GU negative Renal ROS     Musculoskeletal   Abdominal   Peds  Hematology   Anesthesia Other Findings   Reproductive/Obstetrics                           Anesthesia Physical Anesthesia Plan  ASA: II  Anesthesia Plan: General   Post-op Pain Management:    Induction: Intravenous  Airway Management Planned: Oral ETT  Additional Equipment:   Intra-op Plan:   Post-operative Plan: Extubation in OR  Informed Consent: I have reviewed the patients History and Physical, chart, labs and discussed the procedure including the risks, benefits and alternatives for the proposed anesthesia with the patient or authorized representative who has indicated his/her understanding and acceptance.   Dental advisory given  Plan Discussed with: CRNA and Surgeon  Anesthesia Plan Comments: (Plan routine monitors, GETA)        Anesthesia Quick Evaluation

## 2012-08-13 NOTE — Progress Notes (Signed)
Awake, alert, conversant.  Full strength in HF's.  No numbness.  Doing well.

## 2012-08-13 NOTE — Plan of Care (Signed)
Problem: Consults Goal: Diagnosis - Spinal Surgery Outcome: Completed/Met Date Met:  08/13/12 Microdiscectomy

## 2012-08-14 MED ORDER — HYDROCODONE-ACETAMINOPHEN 5-325 MG PO TABS: 1.0000 | ORAL_TABLET | ORAL | Status: DC | PRN

## 2012-08-14 NOTE — Discharge Summary (Signed)
Physician Discharge Summary  Patient ID: Jasmine White MRN: [id number] DOB/AGE: Dec 29, 1945 66 y.o.  Admit date: 08/13/2012 Discharge date: 08/14/2012  Admission Diagnoses: Lumbar HNP    Discharge Diagnoses: same   Discharged Condition: good  Hospital Course: The patient was admitted on 08/13/2012 and taken to the operating room where the patient underwent microdiskectomy. The patient tolerated the procedure well and was taken to the recovery room and then to the floor in stable condition. The hospital course was routine. There were no complications. The wound remained clean dry and intact. Pt had appropriate back soreness. No complaints of leg pain or new N/T/W. The patient remained afebrile with stable vital signs, and tolerated a regular diet. The patient continued to increase activities, and pain was well controlled with oral pain medications.   Consults: None  Significant Diagnostic Studies:  Results for orders placed during the hospital encounter of [id number]  BASIC METABOLIC PANEL      Component Value Range   Sodium 134 (*) 135 - 145 mEq/L   Potassium 3.9  3.5 - 5.1 mEq/L   Chloride 95 (*) 96 - 112 mEq/L   CO2 30  19 - 32 mEq/L   Glucose, Bld 89  70 - 99 mg/dL   BUN 11  6 - 23 mg/dL   Creatinine, Ser 0.65  0.50 - 1.10 mg/dL   Calcium 10.1  8.4 - 10.5 mg/dL   GFR calc non Af Amer 90 (*) >90 mL/min   GFR calc Af Amer >90  >90 mL/min  CBC      Component Value Range   WBC 5.7  4.0 - 10.5 K/uL   RBC 4.67  3.87 - 5.11 MIL/uL   Hemoglobin 13.9  12.0 - 15.0 g/dL   HCT 40.3  36.0 - 46.0 %   MCV 86.3  78.0 - 100.0 fL   MCH 29.8  26.0 - 34.0 pg   MCHC 34.5  30.0 - 36.0 g/dL   RDW 13.0  11.5 - 15.5 %   Platelets 259  150 - 400 K/uL  SURGICAL PCR SCREEN      Component Value Range   MRSA, PCR NEGATIVE  NEGATIVE   Staphylococcus aureus POSITIVE (*) NEGATIVE    Dg Chest 2 View  08/10/2012  *RADIOLOGY REPORT*  Clinical Data: Hypertension.  CHEST - 2 VIEW  Comparison: None.   Findings: Cardiomediastinal silhouette appears normal.  No acute pulmonary disease is noted. Probable nodular density seen in lateral portion of right lung apex.  Bony thorax is intact. Probable biopsy clip seen in right breast.  IMPRESSION: Nodular density seen laterally in right lung apex; further evaluation with apical lordotic view of the chest or chest CT is recommended.   Original Report Authenticated By: Marijo Conception.,  M.D.    Dg Lumbar Spine 2-3 Views  08/13/2012  *RADIOLOGY REPORT*  Clinical Data: L1-L2 microdiskectomy.  LUMBAR SPINE - 2-3 VIEW  Comparison: 08/04/2012.  Findings: 2 the lateral views.  The first, labeled 1530 hours, demonstrates prior L3-L5 trans pedicle screw fixation.  The second image, labeled 1540 hours, demonstrates a surgical device projecting posterior to the superior endplate of L2.  There is mild L1 vertebral body height loss.  This is unchanged since 08/04/2012.  IMPRESSION: Intraoperative localization of the superior endplate of L2.   Original Report Authenticated By: Abigail Miyamoto, M.D.     Antibiotics:  Anti-infectives     Start     Dose/Rate Route Frequency Ordered Stop   08/13/12 2300  ceFAZolin (ANCEF) IVPB 1 g/50 mL premix        1 g 100 mL/hr over 30 Minutes Intravenous Every 8 hours 08/13/12 1724 08/14/12 0712   08/13/12 1546   bacitracin 50,000 Units in sodium chloride irrigation 0.9 % 500 mL irrigation  Status:  Discontinued          As needed 08/13/12 1547 08/13/12 1644   08/13/12 1424   bacitracin 50000 UNITS injection     Comments: FREEZE, PATRICIA: cabinet override         08/13/12 1424 08/14/12 0229   08/13/12 0600   ceFAZolin (ANCEF) IVPB 2 g/50 mL premix  Status:  Discontinued        2 g 100 mL/hr over 30 Minutes Intravenous On call to O.R. 08/12/12 1433 08/13/12 1739          Discharge Exam: Blood pressure 124/70, pulse 64, temperature 97.5 F (36.4 C), temperature source Oral, resp. rate 18, SpO2 96.00%. Neurologic: Grossly  normal Incision ok  Discharge Medications:     Medication List     As of 08/14/2012  8:30 AM    TAKE these medications         ALEVE 220 MG tablet   Generic drug: naproxen sodium   Take 220-440 mg by mouth 2 (two) times daily as needed. For pain      amitriptyline 50 MG tablet   Commonly known as: ELAVIL   Take 50 mg by mouth at bedtime.      aspirin EC 81 MG tablet   Take 81 mg by mouth daily.      Co Q-10 200 MG Caps   Take 1 capsule by mouth daily.      Fish Oil 1200 MG Caps   Take by mouth.      Garlic [id number] MG Caps   Take 1 capsule by mouth daily.      HYDROcodone-acetaminophen 5-325 MG per tablet   Commonly known as: NORCO/VICODIN   Take 1-2 tablets by mouth every 4 (four) hours as needed for pain.      levothyroxine 88 MCG tablet   Commonly known as: SYNTHROID, LEVOTHROID   Take 88 mcg by mouth daily.      losartan-hydrochlorothiazide 100-12.5 MG per tablet   Commonly known as: HYZAAR   Take 1 tablet by mouth daily.      Lutein 20 MG Caps   Take 1 capsule by mouth daily.      Lysine 500 MG Caps   Take 1 capsule by mouth daily.      metoprolol succinate 100 MG 24 hr tablet   Commonly known as: TOPROL-XL   Take 100 mg by mouth daily. Take with or immediately following a meal.      pravastatin 40 MG tablet   Commonly known as: PRAVACHOL   Take 40 mg by mouth daily.      SIMPLY SALINE 0.9 % Aers   Generic drug: Saline   Place 1 spray into the nose daily as needed. For sinuses        Disposition: home  Final Dx: microdiskectomy      Discharge Orders    Future Orders Please Complete By Expires   Diet - low sodium heart healthy      Increase activity slowly      Discharge instructions      Comments:   No bending, twisting, or lifting   Call MD for:  temperature >100.4      Call MD for:  persistant nausea and vomiting      Call MD for:  severe uncontrolled pain      Call MD for:  redness, tenderness, or signs of infection (pain, swelling,  redness, odor or green/yellow discharge around incision site)      Call MD for:  difficulty breathing, headache or visual disturbances      Call MD for:  hives      Remove dressing in 48 hours      Driving Restrictions      Comments:   1 week      Follow-up Information    Follow up with STERN,JOSEPH D, MD. Schedule an appointment as soon as possible for a visit in 2 weeks.   Contact information:   [address] [place] [address] [place] [address] [phone]           Signed: Eustace Moore 08/14/2012, 8:30 AM

## 2012-08-16 ENCOUNTER — Encounter (HOSPITAL_COMMUNITY): Payer: Self-pay | Admitting: Neurosurgery

## 2012-08-17 ENCOUNTER — Encounter (HOSPITAL_COMMUNITY): Payer: Self-pay

## 2012-12-21 ENCOUNTER — Encounter: Payer: Self-pay | Admitting: Nurse Practitioner

## 2012-12-21 ENCOUNTER — Ambulatory Visit (INDEPENDENT_AMBULATORY_CARE_PROVIDER_SITE_OTHER): Payer: Medicare PPO | Admitting: Nurse Practitioner

## 2012-12-21 ENCOUNTER — Other Ambulatory Visit: Payer: Self-pay | Admitting: Family Medicine

## 2012-12-21 ENCOUNTER — Encounter: Payer: Self-pay | Admitting: Family Medicine

## 2012-12-21 VITALS — BP 161/93 | HR 67 | Temp 97.0°F | Ht 66.5 in | Wt 164.0 lb

## 2012-12-21 DIAGNOSIS — E039 Hypothyroidism, unspecified: Secondary | ICD-10-CM | POA: Insufficient documentation

## 2012-12-21 DIAGNOSIS — F32A Depression, unspecified: Secondary | ICD-10-CM

## 2012-12-21 DIAGNOSIS — G4700 Insomnia, unspecified: Secondary | ICD-10-CM

## 2012-12-21 DIAGNOSIS — Z01419 Encounter for gynecological examination (general) (routine) without abnormal findings: Secondary | ICD-10-CM

## 2012-12-21 DIAGNOSIS — I10 Essential (primary) hypertension: Secondary | ICD-10-CM

## 2012-12-21 DIAGNOSIS — E785 Hyperlipidemia, unspecified: Secondary | ICD-10-CM

## 2012-12-21 DIAGNOSIS — Z0000 Encounter for general adult medical examination without abnormal findings: Secondary | ICD-10-CM

## 2012-12-21 DIAGNOSIS — F329 Major depressive disorder, single episode, unspecified: Secondary | ICD-10-CM

## 2012-12-21 DIAGNOSIS — Z23 Encounter for immunization: Secondary | ICD-10-CM

## 2012-12-21 DIAGNOSIS — F3289 Other specified depressive episodes: Secondary | ICD-10-CM

## 2012-12-21 LAB — POCT CBC
Granulocyte percent: 75.9 %G (ref 37–80)
HCT, POC: 37.8 % (ref 37.7–47.9)
Hemoglobin: 13.7 g/dL (ref 12.2–16.2)
MCV: 87.6 fL (ref 80–97)
POC Granulocyte: 4.8 (ref 2–6.9)

## 2012-12-21 LAB — POCT URINALYSIS DIPSTICK
Blood, UA: NEGATIVE
Ketones, UA: NEGATIVE
Protein, UA: NEGATIVE
Spec Grav, UA: <=1.005

## 2012-12-21 LAB — POCT UA - MICROSCOPIC ONLY
Crystals, Ur, HPF, POC: NEGATIVE
Mucus, UA: NEGATIVE
RBC, urine, microscopic: NEGATIVE
WBC, Ur, HPF, POC: NEGATIVE
Yeast, UA: NEGATIVE

## 2012-12-21 MED ORDER — AMITRIPTYLINE HCL 50 MG PO TABS: 50.0000 mg | ORAL_TABLET | Freq: Every day | ORAL | Status: DC

## 2012-12-21 MED ORDER — PRAVASTATIN SODIUM 40 MG PO TABS: 40.0000 mg | ORAL_TABLET | Freq: Every day | ORAL | Status: DC

## 2012-12-21 MED ORDER — LOSARTAN POTASSIUM-HCTZ 100-12.5 MG PO TABS: 1.0000 | ORAL_TABLET | Freq: Every day | ORAL | Status: DC

## 2012-12-21 MED ORDER — SYNTHROID 88 MCG PO TABS: 88.0000 ug | ORAL_TABLET | Freq: Every day | ORAL | Status: DC

## 2012-12-21 MED ORDER — METOPROLOL SUCCINATE ER 100 MG PO TB24: 100.0000 mg | ORAL_TABLET | Freq: Every day | ORAL | Status: DC

## 2012-12-21 NOTE — Progress Notes (Signed)
Subjective:    Patient ID: Jasmine White, female    DOB: 08-03-1946, 67 y.o.   MRN: [id number]  Hypertension This is a chronic problem. The current episode started more than 1 year ago. The problem is unchanged (always high when comes to dr. office. Always good at home.). The problem is controlled. Pertinent negatives include no chest pain, malaise/fatigue, palpitations, peripheral edema or shortness of breath. There are no associated agents to hypertension. Risk factors for coronary artery disease include dyslipidemia. Past treatments include angiotensin blockers and beta blockers. The current treatment provides significant improvement. There are no compliance problems.  Hypertensive end-organ damage includes a thyroid problem.  Hyperlipidemia This is a chronic problem. The current episode started more than 1 year ago. The problem is controlled. Recent lipid tests were reviewed and are low. There are no known factors aggravating her hyperlipidemia. Pertinent negatives include no chest pain, focal weakness, leg pain, myalgias or shortness of breath. Current antihyperlipidemic treatment includes statins. The current treatment provides significant improvement of lipids. There are no compliance problems.  Risk factors for coronary artery disease include hypertension and post-menopausal.  Hypothyroidism This is a chronic problem. Doing well. No complaint of fatigue Depression This is a chronic problem. Doing well with current meds. No Suicidal thoughts.  Review of Systems  Constitutional: Negative for malaise/fatigue.  Respiratory: Negative for shortness of breath.   Cardiovascular: Negative for chest pain and palpitations.  Musculoskeletal: Negative for myalgias.  Neurological: Negative for focal weakness.   Allergies  Allergen Reactions  . Percocet (Oxycodone-Acetaminophen) Nausea Only    Outpatient Encounter Prescriptions as of 12/21/2012  Medication Sig Dispense Refill  . amitriptyline  (ELAVIL) 50 MG tablet Take 50 mg by mouth at bedtime.      Marland Kitchen aspirin EC 81 MG tablet Take 81 mg by mouth daily.      . Coenzyme Q10 (CO Q-10) 200 MG CAPS Take 1 capsule by mouth daily.      . Garlic [id number] MG CAPS Take 1 capsule by mouth daily.      Marland Kitchen HYDROcodone-acetaminophen (NORCO/VICODIN) 5-325 MG per tablet Take 1-2 tablets by mouth every 4 (four) hours as needed for pain.  60 tablet  1  . levothyroxine (SYNTHROID, LEVOTHROID) 88 MCG tablet Take 88 mcg by mouth daily.      Marland Kitchen losartan-hydrochlorothiazide (HYZAAR) 100-12.5 MG per tablet Take 1 tablet by mouth daily.      . Lutein 20 MG CAPS Take 1 capsule by mouth daily.      Marland Kitchen Lysine 500 MG CAPS Take 1 capsule by mouth daily.      . metoprolol succinate (TOPROL-XL) 100 MG 24 hr tablet Take 100 mg by mouth daily. Take with or immediately following a meal.      . naproxen sodium (ALEVE) 220 MG tablet Take 220-440 mg by mouth 2 (two) times daily as needed. For pain      . Omega-3 Fatty Acids (FISH OIL) 1200 MG CAPS Take by mouth.      . pravastatin (PRAVACHOL) 40 MG tablet Take 40 mg by mouth daily.      . Saline (SIMPLY SALINE) 0.9 % AERS Place 1 spray into the nose daily as needed. For sinuses       No facility-administered encounter medications on file as of 12/21/2012.    Past Medical History  Diagnosis Date  . PONV (postoperative nausea and vomiting)     with knee replacement b/p dropped  . Hypertension     takes Hyzaar  and toprol daily  . Hyperlipidemia     takes Pravastatin daily  . Sinusitis     finished zpak yesterday  . Arthritis   . Joint pain   . Joint swelling   . Chronic back pain     HNP/stenosis and radiculopathy  . Bruises easily   . Urinary frequency   . Nocturia   . Hypothyroidism     takes Synthroid daily  . Insomnia     takes Elevil nightly    Past Surgical History  Procedure Laterality Date  . Abdominal hysterectomy    . Back surgery    . Bil knee replacements    . Basal cell skin cancer    . Breast  biopsy    . Lumbar laminectomy/decompression microdiscectomy  08/13/2012    Procedure: LUMBAR LAMINECTOMY/DECOMPRESSION MICRODISCECTOMY 1 LEVEL;  Surgeon: Erline Levine, MD;  Location: [facility];  Service: Neurosurgery;  Laterality: Left;  Left lumbar one-two Microdiskectomy    History   Social History  . Marital Status: Married    Spouse Name: N/A    Number of Children: N/A  . Years of Education: N/A   Occupational History  . Not on file.   Social History Main Topics  . Smoking status: Never Smoker   . Smokeless tobacco: Not on file  . Alcohol Use: No  . Drug Use: No  . Sexually Active: Not Currently   Other Topics Concern  . Not on file   Social History Narrative  . No narrative on file         Objective:   Physical Exam  Constitutional: She is oriented to person, place, and time. She appears well-developed and well-nourished.  HENT:  Head: Normocephalic.  Right Ear: Hearing, tympanic membrane, external ear and ear canal normal.  Left Ear: Hearing, tympanic membrane, external ear and ear canal normal.  Nose: Nose normal.  Mouth/Throat: Uvula is midline and oropharynx is clear and moist.  Eyes: Conjunctivae and EOM are normal. Pupils are equal, White, and reactive to light.  Neck: Normal range of motion and full passive range of motion without pain. Neck supple. No JVD present. Carotid bruit is not present. No mass and no thyromegaly present.  Cardiovascular: Normal rate, normal heart sounds and intact distal pulses.   No murmur heard. Pulmonary/Chest: Effort normal and breath sounds normal. Right breast exhibits no inverted nipple, no mass, no nipple discharge, no skin change and no tenderness. Left breast exhibits no inverted nipple, no mass, no nipple discharge, no skin change and no tenderness.  Abdominal: Soft. Bowel sounds are normal. She exhibits no mass. There is no tenderness.  Genitourinary: Vagina normal. No breast swelling, tenderness, discharge or  bleeding.  bimanual exam-No adnexal masses or tenderness.  Small cystocele and rectocele  Musculoskeletal: Normal range of motion.  Lymphadenopathy:    She has no cervical adenopathy.  Neurological: She is alert and oriented to person, place, and time.  Skin: Skin is warm and dry.  Psychiatric: She has a normal mood and affect. Her behavior is normal. Judgment and thought content normal.   BP 161/93  Pulse 67  Temp(Src) 97 F (36.1 C) (Oral)  Ht 5' 6.5" (1.689 m)  Wt 164 lb (74.39 kg)  BMI 26.08 kg/m2         Assessment & Plan:  1. Annual physical exam -POCT urinalysis dipstick - POCT UA - Microscopic Only - POCT CBC - Pap IG w/ reflex to HPV when ASC-U  2. Essential hypertension, benign  Low Na+ diet - COMPLETE METABOLIC PANEL WITH GFR - metoprolol succinate (TOPROL-XL) 100 MG 24 hr tablet; Take 1 tablet (100 mg total) by mouth daily. Take with or immediately following a meal.  Dispense: 90 tablet; Refill: 1 - losartan-hydrochlorothiazide (HYZAAR) 100-12.5 MG per tablet; Take 1 tablet by mouth daily.  Dispense: 90 tablet; Refill: 1  3. Hyperlipidemia LDL goal < 130 Low fat diet and exercise encouraged - NMR Lipoprofile with Lipids - pravastatin (PRAVACHOL) 40 MG tablet; Take 1 tablet (40 mg total) by mouth daily.  Dispense: 90 tablet; Refill: 1  4. Unspecified hypothyroidism - Thyroid Panel With TSH - SYNTHROID 88 MCG tablet; Take 1 tablet (88 mcg total) by mouth daily before breakfast.  Dispense: 901 tablet; Refill: 1  5. Depression Stress management  6. Insomnia Bedtime ritual - amitriptyline (ELAVIL) 50 MG tablet; Take 1 tablet (50 mg total) by mouth at bedtime.  Dispense: 90 tablet; Refill: Millen, FNP

## 2012-12-21 NOTE — Patient Instructions (Signed)

## 2012-12-21 NOTE — Addendum Note (Signed)
Addended by: Chevis Pretty on: 12/21/2012 03:26 PM   Modules accepted: Orders

## 2012-12-22 LAB — COMPLETE METABOLIC PANEL WITH GFR
Albumin: 4.7 g/dL (ref 3.5–5.2)
CO2: 32 mEq/L (ref 19–32)
Calcium: 9.9 mg/dL (ref 8.4–10.5)
GFR, Est African American: 83 mL/min
GFR, Est Non African American: 72 mL/min
Glucose, Bld: 98 mg/dL (ref 70–99)
Potassium: 4 mEq/L (ref 3.5–5.3)
Sodium: 131 mEq/L — ABNORMAL LOW (ref 135–145)
Total Protein: 7.5 g/dL (ref 6.0–8.3)

## 2012-12-22 LAB — NMR LIPOPROFILE WITH LIPIDS
HDL Size: 9.7 nm (ref >=9.2)
LDL Size: 20.3 nm — ABNORMAL LOW (ref >20.5)
Large HDL-P: 9.5 umol/L (ref >=4.8)
Large VLDL-P: 1.5 nmol/L (ref <=2.7)
Small LDL Particle Number: 568 nmol/L — ABNORMAL HIGH (ref <=527)

## 2012-12-22 LAB — THYROID PANEL WITH TSH: T3 Uptake: 29 % (ref 22.5–37.0)

## 2012-12-24 LAB — PAP IG W/ RFLX HPV ASCU

## 2012-12-28 ENCOUNTER — Telehealth: Payer: Self-pay | Admitting: *Deleted

## 2012-12-28 NOTE — Telephone Encounter (Signed)
Message copied by Zannie Cove on Tue Dec 28, 2012  2:05 PM ------      Message from: Chevis Pretty      Created: Tue Dec 28, 2012  8:47 AM       All labs look good. Continue all meds recheck in 3-6 months.      Dont have PAP results yet, I am checking on that. ------

## 2012-12-29 ENCOUNTER — Telehealth: Payer: Self-pay | Admitting: Nurse Practitioner

## 2012-12-29 NOTE — Telephone Encounter (Signed)
Pt aware of results 

## 2013-05-06 ENCOUNTER — Telehealth: Payer: Self-pay | Admitting: Nurse Practitioner

## 2013-05-18 ENCOUNTER — Encounter: Payer: Self-pay | Admitting: *Deleted

## 2013-06-29 ENCOUNTER — Ambulatory Visit (INDEPENDENT_AMBULATORY_CARE_PROVIDER_SITE_OTHER): Payer: Medicare PPO | Admitting: Nurse Practitioner

## 2013-06-29 ENCOUNTER — Other Ambulatory Visit: Payer: Medicare PPO

## 2013-06-29 ENCOUNTER — Encounter: Payer: Self-pay | Admitting: Nurse Practitioner

## 2013-06-29 ENCOUNTER — Encounter (INDEPENDENT_AMBULATORY_CARE_PROVIDER_SITE_OTHER): Payer: Self-pay

## 2013-06-29 VITALS — BP 140/89 | HR 86 | Temp 97.2°F | Ht 66.5 in | Wt 155.5 lb

## 2013-06-29 DIAGNOSIS — Z23 Encounter for immunization: Secondary | ICD-10-CM

## 2013-06-29 DIAGNOSIS — G4700 Insomnia, unspecified: Secondary | ICD-10-CM

## 2013-06-29 DIAGNOSIS — I10 Essential (primary) hypertension: Secondary | ICD-10-CM

## 2013-06-29 DIAGNOSIS — E785 Hyperlipidemia, unspecified: Secondary | ICD-10-CM

## 2013-06-29 DIAGNOSIS — E039 Hypothyroidism, unspecified: Secondary | ICD-10-CM

## 2013-06-29 MED ORDER — AMITRIPTYLINE HCL 50 MG PO TABS: 50.0000 mg | ORAL_TABLET | Freq: Every day | ORAL | Status: DC

## 2013-06-29 MED ORDER — METOPROLOL SUCCINATE ER 100 MG PO TB24: 100.0000 mg | ORAL_TABLET | Freq: Every day | ORAL | Status: DC

## 2013-06-29 MED ORDER — LOSARTAN POTASSIUM-HCTZ 100-12.5 MG PO TABS: 1.0000 | ORAL_TABLET | Freq: Every day | ORAL | Status: DC

## 2013-06-29 MED ORDER — SYNTHROID 88 MCG PO TABS: 88.0000 ug | ORAL_TABLET | Freq: Every day | ORAL | Status: DC

## 2013-06-29 MED ORDER — PRAVASTATIN SODIUM 40 MG PO TABS: 40.0000 mg | ORAL_TABLET | Freq: Every day | ORAL | Status: DC

## 2013-06-29 NOTE — Progress Notes (Signed)
Subjective:    Patient ID: Jasmine White, female    DOB: 02/09/1946, 67 y.o.   MRN: [id number]  Hypertension This is a chronic problem. The current episode started more than 1 year ago. The problem is unchanged (always high when comes to dr. office. Always good at home.). The problem is controlled. Pertinent negatives include no chest pain, malaise/fatigue, palpitations, peripheral edema or shortness of breath. There are no associated agents to hypertension. Risk factors for coronary artery disease include dyslipidemia. Past treatments include angiotensin blockers and beta blockers. The current treatment provides significant improvement. There are no compliance problems.  Hypertensive end-organ damage includes a thyroid problem.  Hyperlipidemia This is a chronic problem. The current episode started more than 1 year ago. The problem is controlled. Recent lipid tests were reviewed and are low. There are no known factors aggravating her hyperlipidemia. Pertinent negatives include no chest pain, focal weakness, leg pain, myalgias or shortness of breath. Current antihyperlipidemic treatment includes statins. The current treatment provides significant improvement of lipids. There are no compliance problems.  Risk factors for coronary artery disease include hypertension and post-menopausal.  Hypothyroidism This is a chronic problem. Doing well. No complaint of fatigue Depression This is a chronic problem. Doing well with current meds. No Suicidal thoughts.  Review of Systems  Constitutional: Negative for malaise/fatigue.  Respiratory: Negative for shortness of breath.   Cardiovascular: Negative for chest pain and palpitations.  Musculoskeletal: Negative for myalgias.  Neurological: Negative for focal weakness.       Objective:   Physical Exam  Constitutional: She is oriented to person, place, and time. She appears well-developed and well-nourished.  HENT:  Head: Normocephalic.  Right Ear:  Hearing, tympanic membrane, external ear and ear canal normal.  Left Ear: Hearing, tympanic membrane, external ear and ear canal normal.  Nose: Nose normal.  Mouth/Throat: Uvula is midline and oropharynx is clear and moist.  Eyes: Conjunctivae and EOM are normal. Pupils are equal, White, and reactive to light.  Neck: Trachea normal, normal range of motion and full passive range of motion without pain. Neck supple. No JVD present. Carotid bruit is not present. No mass and no thyromegaly present.  Cardiovascular: Normal rate, regular rhythm, normal heart sounds and intact distal pulses.  Exam reveals no gallop and no friction rub.   No murmur heard. Pulmonary/Chest: Effort normal and breath sounds normal. Right breast exhibits no inverted nipple, no mass, no nipple discharge, no skin change and no tenderness. Left breast exhibits no inverted nipple, no mass, no nipple discharge, no skin change and no tenderness.  Abdominal: Soft. Bowel sounds are normal. She exhibits no distension and no mass. There is no tenderness.  Genitourinary: No breast swelling, tenderness, discharge or bleeding.  Musculoskeletal: Normal range of motion.  Lymphadenopathy:    She has no cervical adenopathy.  Neurological: She is alert and oriented to person, place, and time. She has normal reflexes.  Skin: Skin is warm and dry.  Psychiatric: She has a normal mood and affect. Her behavior is normal. Judgment and thought content normal.    BP 140/89  Pulse 86  Temp(Src) 97.2 F (36.2 C) (Oral)  Ht 5' 6.5" (1.689 m)  Wt 155 lb 8 oz (70.534 kg)  BMI 24.73 kg/m2        Assessment & Plan:   1. Unspecified hypothyroidism   2. Insomnia   3. Hyperlipidemia LDL goal < 130   4. Essential hypertension, benign    Orders Placed This Encounter  Procedures  .  CMP14+EGFR  . NMR, lipoprofile  . Thyroid Panel With TSH   Meds ordered this encounter  Medications  . SYNTHROID 88 MCG tablet    Sig: Take 1 tablet (88 mcg  total) by mouth daily before breakfast.    Dispense:  90 tablet    Refill:  1    Order Specific Question:  Supervising Provider    Answer:  Chipper Herb [[id number]]  . amitriptyline (ELAVIL) 50 MG tablet    Sig: Take 1 tablet (50 mg total) by mouth at bedtime.    Dispense:  90 tablet    Refill:  1    Order Specific Question:  Supervising Provider    Answer:  Chipper Herb [[id number]]  . pravastatin (PRAVACHOL) 40 MG tablet    Sig: Take 1 tablet (40 mg total) by mouth daily.    Dispense:  90 tablet    Refill:  1    Order Specific Question:  Supervising Provider    Answer:  Chipper Herb [[id number]]  . metoprolol succinate (TOPROL-XL) 100 MG 24 hr tablet    Sig: Take 1 tablet (100 mg total) by mouth daily. Take with or immediately following a meal.    Dispense:  90 tablet    Refill:  1    Order Specific Question:  Supervising Provider    Answer:  Chipper Herb [[id number]]  . losartan-hydrochlorothiazide (HYZAAR) 100-12.5 MG per tablet    Sig: Take 1 tablet by mouth daily.    Dispense:  90 tablet    Refill:  1    Order Specific Question:  Supervising Provider    Answer:  Jasmine White    Continue all meds Labs pending Diet and exercise encouraged Health maintenance reviewed Follow up in 3 months Refuses colonoscopy hemocult cards given to patient  Jasmine Hassell Done, FNP

## 2013-06-29 NOTE — Patient Instructions (Signed)

## 2013-07-01 ENCOUNTER — Other Ambulatory Visit: Payer: Medicare PPO

## 2013-07-01 ENCOUNTER — Ambulatory Visit: Payer: Medicare PPO | Admitting: Nurse Practitioner

## 2013-07-01 LAB — THYROID PANEL WITH TSH
T3 Uptake Ratio: 28 % (ref 24–39)
T4, Total: 11.1 ug/dL (ref 4.5–12.0)

## 2013-07-01 LAB — NMR, LIPOPROFILE
LDL Particle Number: 1173 nmol/L — ABNORMAL HIGH (ref <1000)
LDL Size: 21 nm (ref >20.5)
LP-IR Score: <25 (ref <=45)
Triglycerides by NMR: 79 mg/dL (ref <150)

## 2013-07-01 LAB — CMP14+EGFR
ALT: 21 IU/L (ref 0–32)
Alkaline Phosphatase: 76 IU/L (ref 39–117)
Chloride: 94 mmol/L — ABNORMAL LOW (ref 97–108)
GFR calc Af Amer: 81 mL/min/{1.73_m2} (ref >59)
Glucose: 107 mg/dL — ABNORMAL HIGH (ref 65–99)
Potassium: 4.8 mmol/L (ref 3.5–5.2)
Total Bilirubin: 0.6 mg/dL (ref 0.0–1.2)
Total Protein: 7.7 g/dL (ref 6.0–8.5)

## 2013-07-15 ENCOUNTER — Ambulatory Visit: Payer: Medicare PPO | Admitting: Nurse Practitioner

## 2013-12-30 ENCOUNTER — Encounter: Payer: Self-pay | Admitting: Nurse Practitioner

## 2013-12-30 ENCOUNTER — Ambulatory Visit (INDEPENDENT_AMBULATORY_CARE_PROVIDER_SITE_OTHER): Payer: Medicare PPO | Admitting: Nurse Practitioner

## 2013-12-30 VITALS — BP 159/92 | HR 75 | Temp 97.5°F | Ht 66.5 in | Wt 163.4 lb

## 2013-12-30 DIAGNOSIS — G4700 Insomnia, unspecified: Secondary | ICD-10-CM

## 2013-12-30 DIAGNOSIS — F32A Depression, unspecified: Secondary | ICD-10-CM

## 2013-12-30 DIAGNOSIS — M79641 Pain in right hand: Secondary | ICD-10-CM

## 2013-12-30 DIAGNOSIS — E785 Hyperlipidemia, unspecified: Secondary | ICD-10-CM

## 2013-12-30 DIAGNOSIS — E039 Hypothyroidism, unspecified: Secondary | ICD-10-CM

## 2013-12-30 DIAGNOSIS — F329 Major depressive disorder, single episode, unspecified: Secondary | ICD-10-CM

## 2013-12-30 DIAGNOSIS — M79609 Pain in unspecified limb: Secondary | ICD-10-CM

## 2013-12-30 DIAGNOSIS — F3289 Other specified depressive episodes: Secondary | ICD-10-CM

## 2013-12-30 DIAGNOSIS — I10 Essential (primary) hypertension: Secondary | ICD-10-CM

## 2013-12-30 MED ORDER — LOSARTAN POTASSIUM-HCTZ 100-12.5 MG PO TABS: 1.0000 | ORAL_TABLET | Freq: Every day | ORAL | Status: DC

## 2013-12-30 MED ORDER — SYNTHROID 88 MCG PO TABS: 88.0000 ug | ORAL_TABLET | Freq: Every day | ORAL | Status: DC

## 2013-12-30 MED ORDER — AMITRIPTYLINE HCL 50 MG PO TABS: 50.0000 mg | ORAL_TABLET | Freq: Every day | ORAL | Status: DC

## 2013-12-30 MED ORDER — METOPROLOL SUCCINATE ER 100 MG PO TB24: 100.0000 mg | ORAL_TABLET | Freq: Every day | ORAL | Status: DC

## 2013-12-30 MED ORDER — PRAVASTATIN SODIUM 40 MG PO TABS: 40.0000 mg | ORAL_TABLET | Freq: Every day | ORAL | Status: DC

## 2013-12-30 NOTE — Patient Instructions (Signed)

## 2013-12-30 NOTE — Progress Notes (Signed)
Subjective:    Patient ID: Jasmine White, female    DOB: 18-Nov-1945, 68 y.o.   MRN: [id number]  Patient here today for follow up of chronic medical problems.  Hypertension This is a chronic problem. The current episode started more than 1 year ago. The problem is unchanged (always high when comes to dr. office. Always good at home.). The problem is controlled. Pertinent negatives include no chest pain, malaise/fatigue, palpitations, peripheral edema or shortness of breath. There are no associated agents to hypertension. Risk factors for coronary artery disease include dyslipidemia. Past treatments include angiotensin blockers and beta blockers. The current treatment provides significant improvement. There are no compliance problems.  Hypertensive end-organ damage includes a thyroid problem.  Hyperlipidemia This is a chronic problem. The current episode started more than 1 year ago. The problem is controlled. Recent lipid tests were reviewed and are low. There are no known factors aggravating her hyperlipidemia. Pertinent negatives include no chest pain, focal weakness, leg pain, myalgias or shortness of breath. Current antihyperlipidemic treatment includes statins. The current treatment provides significant improvement of lipids. There are no compliance problems.  Risk factors for coronary artery disease include hypertension and post-menopausal.  Hypothyroidism This is a chronic problem. Doing well. No complaint of fatigue Depression This is a chronic problem. Doing well with current meds. No Suicidal thoughts.  * knots in palm of right hand that have gotten bigger and are causing problems with her fingers   Review of Systems  Constitutional: Negative for malaise/fatigue.  Respiratory: Negative for shortness of breath.   Cardiovascular: Negative for chest pain and palpitations.  Musculoskeletal: Negative for myalgias.  Neurological: Negative for focal weakness.       Objective:   Physical Exam  Constitutional: She is oriented to person, place, and time. She appears well-developed and well-nourished.  HENT:  Head: Normocephalic.  Right Ear: Hearing, tympanic membrane, external ear and ear canal normal.  Left Ear: Hearing, tympanic membrane, external ear and ear canal normal.  Nose: Nose normal.  Mouth/Throat: Uvula is midline and oropharynx is clear and moist.  Eyes: Conjunctivae and EOM are normal. Pupils are equal, White, and reactive to light.  Neck: Trachea normal, normal range of motion and full passive range of motion without pain. Neck supple. No JVD present. Carotid bruit is not present. No mass and no thyromegaly present.  Cardiovascular: Normal rate, regular rhythm, normal heart sounds and intact distal pulses.  Exam reveals no gallop and no friction rub.   No murmur heard. Pulmonary/Chest: Effort normal and breath sounds normal. Right breast exhibits no inverted nipple, no mass, no nipple discharge, no skin change and no tenderness. Left breast exhibits no inverted nipple, no mass, no nipple discharge, no skin change and no tenderness.  Abdominal: Soft. Bowel sounds are normal. She exhibits no distension and no mass. There is no tenderness.  Genitourinary: No breast swelling, tenderness, discharge or bleeding.  Musculoskeletal: Normal range of motion.  3 2cm nontender nodules in palm of right hand  Lymphadenopathy:    She has no cervical adenopathy.  Neurological: She is alert and oriented to person, place, and time. She has normal reflexes.  Skin: Skin is warm and dry.  Psychiatric: She has a normal mood and affect. Her behavior is normal. Judgment and thought content normal.    BP 159/92  Pulse 75  Temp(Src) 97.5 F (36.4 C) (Oral)  Ht 5' 6.5" (1.689 m)  Wt 163 lb 6.4 oz (74.118 kg)  BMI 25.98 kg/m2  Assessment & Plan:   1. Unspecified hypothyroidism   2. Hyperlipidemia LDL goal < 130   3. Essential hypertension, benign   4.  Depression   5. Insomnia   6. nodules palm right hand  Orders Placed This Encounter  Procedures  . CMP14+EGFR  . NMR, lipoprofile  . Thyroid Panel With TSH  . Ambulatory referral to Orthopedic Surgery    Referral Priority:  Routine    Referral Type:  Surgical    Referral Reason:  Specialty Services Required    Requested Specialty:  Orthopedic Surgery    Number of Visits Requested:  1    Meds ordered this encounter  Medications  . losartan-hydrochlorothiazide (HYZAAR) 100-12.5 MG per tablet    Sig: Take 1 tablet by mouth daily.    Dispense:  90 tablet    Refill:  1    Order Specific Question:  Supervising Provider    Answer:  Chipper Herb [[id number]]  . metoprolol succinate (TOPROL-XL) 100 MG 24 hr tablet    Sig: Take 1 tablet (100 mg total) by mouth daily. Take with or immediately following a meal.    Dispense:  90 tablet    Refill:  1    Order Specific Question:  Supervising Provider    Answer:  Chipper Herb [[id number]]  . pravastatin (PRAVACHOL) 40 MG tablet    Sig: Take 1 tablet (40 mg total) by mouth daily.    Dispense:  90 tablet    Refill:  1    Order Specific Question:  Supervising Provider    Answer:  Chipper Herb [[id number]]  . amitriptyline (ELAVIL) 50 MG tablet    Sig: Take 1 tablet (50 mg total) by mouth at bedtime.    Dispense:  90 tablet    Refill:  1    Order Specific Question:  Supervising Provider    Answer:  Chipper Herb [[id number]]  . SYNTHROID 88 MCG tablet    Sig: Take 1 tablet (88 mcg total) by mouth daily before breakfast.    Dispense:  90 tablet    Refill:  1    Order Specific Question:  Supervising Provider    Answer:  Chipper Herb [[id number]]  hemoccult cards given to patient with directions Refuses colonoscoy Labs pending Health maintenance reviewed Diet and exercise encouraged Continue all meds Follow up  In 6 months   [place], FNP

## 2014-01-01 LAB — CMP14+EGFR
A/G RATIO: 2 (ref 1.1–2.5)
ALBUMIN: 4.7 g/dL (ref 3.6–4.8)
ALT: 15 IU/L (ref 0–32)
AST: 38 IU/L (ref 0–40)
Alkaline Phosphatase: 83 IU/L (ref 39–117)
BILIRUBIN TOTAL: 0.6 mg/dL (ref 0.0–1.2)
BUN/Creatinine Ratio: 19 (ref 11–26)
BUN: 14 mg/dL (ref 8–27)
CALCIUM: 9.9 mg/dL (ref 8.7–10.3)
CO2: 27 mmol/L (ref 18–29)
CREATININE: 0.73 mg/dL (ref 0.57–1.00)
Chloride: 94 mmol/L — ABNORMAL LOW (ref 97–108)
GFR, EST AFRICAN AMERICAN: 98 mL/min/{1.73_m2} (ref >59)
GFR, EST NON AFRICAN AMERICAN: 85 mL/min/{1.73_m2} (ref >59)
GLOBULIN, TOTAL: 2.4 g/dL (ref 1.5–4.5)
GLUCOSE: 99 mg/dL (ref 65–99)
Potassium: 4.1 mmol/L (ref 3.5–5.2)
Sodium: 138 mmol/L (ref 134–144)
TOTAL PROTEIN: 7.1 g/dL (ref 6.0–8.5)

## 2014-01-01 LAB — NMR, LIPOPROFILE
CHOLESTEROL: 181 mg/dL (ref <200)
HDL CHOLESTEROL BY NMR: 71 mg/dL (ref >=40)
HDL PARTICLE NUMBER: 39.7 umol/L (ref >=30.5)
LDL Particle Number: 1447 nmol/L — ABNORMAL HIGH (ref <1000)
LDL SIZE: 20.9 nm (ref >20.5)
LDLC SERPL CALC-MCNC: 93 mg/dL (ref <100)
LP-IR Score: <25 (ref <=45)
SMALL LDL PARTICLE NUMBER: 460 nmol/L (ref <=527)
TRIGLYCERIDES BY NMR: 83 mg/dL (ref <150)

## 2014-01-01 LAB — THYROID PANEL WITH TSH
Free Thyroxine Index: 3.4 (ref 1.2–4.9)
T3 Uptake Ratio: 29 % (ref 24–39)
T4 TOTAL: 11.7 ug/dL (ref 4.5–12.0)
TSH: 0.275 u[IU]/mL — ABNORMAL LOW (ref 0.450–4.500)

## 2014-01-23 ENCOUNTER — Encounter: Payer: Self-pay | Admitting: *Deleted

## 2014-07-06 ENCOUNTER — Ambulatory Visit (INDEPENDENT_AMBULATORY_CARE_PROVIDER_SITE_OTHER): Payer: Medicare PPO | Admitting: Nurse Practitioner

## 2014-07-06 ENCOUNTER — Encounter (INDEPENDENT_AMBULATORY_CARE_PROVIDER_SITE_OTHER): Payer: Self-pay

## 2014-07-06 ENCOUNTER — Encounter: Payer: Self-pay | Admitting: Nurse Practitioner

## 2014-07-06 VITALS — BP 136/87 | HR 85 | Temp 97.4°F | Ht 66.5 in | Wt 164.2 lb

## 2014-07-06 DIAGNOSIS — E785 Hyperlipidemia, unspecified: Secondary | ICD-10-CM

## 2014-07-06 DIAGNOSIS — E034 Atrophy of thyroid (acquired): Secondary | ICD-10-CM

## 2014-07-06 DIAGNOSIS — G4700 Insomnia, unspecified: Secondary | ICD-10-CM

## 2014-07-06 DIAGNOSIS — Z23 Encounter for immunization: Secondary | ICD-10-CM

## 2014-07-06 DIAGNOSIS — E038 Other specified hypothyroidism: Secondary | ICD-10-CM

## 2014-07-06 DIAGNOSIS — I10 Essential (primary) hypertension: Secondary | ICD-10-CM

## 2014-07-06 DIAGNOSIS — M549 Dorsalgia, unspecified: Secondary | ICD-10-CM

## 2014-07-06 DIAGNOSIS — F32A Depression, unspecified: Secondary | ICD-10-CM

## 2014-07-06 DIAGNOSIS — F329 Major depressive disorder, single episode, unspecified: Secondary | ICD-10-CM

## 2014-07-06 DIAGNOSIS — G8929 Other chronic pain: Secondary | ICD-10-CM

## 2014-07-06 DIAGNOSIS — Z13820 Encounter for screening for osteoporosis: Secondary | ICD-10-CM

## 2014-07-06 MED ORDER — AMITRIPTYLINE HCL 50 MG PO TABS: 50.0000 mg | ORAL_TABLET | Freq: Every day | ORAL | Status: DC

## 2014-07-06 MED ORDER — PRAVASTATIN SODIUM 40 MG PO TABS: 40.0000 mg | ORAL_TABLET | Freq: Every day | ORAL | Status: DC

## 2014-07-06 MED ORDER — LOSARTAN POTASSIUM-HCTZ 100-12.5 MG PO TABS: 1.0000 | ORAL_TABLET | Freq: Every day | ORAL | Status: DC

## 2014-07-06 MED ORDER — HYDROCODONE-ACETAMINOPHEN 5-325 MG PO TABS: 1.0000 | ORAL_TABLET | Freq: Four times a day (QID) | ORAL | Status: DC | PRN

## 2014-07-06 MED ORDER — SYNTHROID 88 MCG PO TABS: 88.0000 ug | ORAL_TABLET | Freq: Every day | ORAL | Status: DC

## 2014-07-06 MED ORDER — METOPROLOL SUCCINATE ER 100 MG PO TB24: 100.0000 mg | ORAL_TABLET | Freq: Every day | ORAL | Status: DC

## 2014-07-06 NOTE — Patient Instructions (Signed)

## 2014-07-06 NOTE — Progress Notes (Signed)
Subjective:    Patient ID: Jasmine White, female    DOB: Jun 25, 1946, 68 y.o.   MRN: [id number]  Patient here today for follow up of chronic medical problems.  Hypertension This is a chronic problem. The current episode started more than 1 year ago. The problem is unchanged (always high when comes to dr. office. Always good at home.). The problem is controlled. Pertinent negatives include no chest pain, malaise/fatigue, palpitations, peripheral edema or shortness of breath. There are no associated agents to hypertension. Risk factors for coronary artery disease include dyslipidemia. Past treatments include angiotensin blockers and beta blockers. The current treatment provides significant improvement. There are no compliance problems.  Hypertensive end-organ damage includes a thyroid problem.  Hyperlipidemia This is a chronic problem. The current episode started more than 1 year ago. The problem is controlled. Recent lipid tests were reviewed and are low. There are no known factors aggravating her hyperlipidemia. Pertinent negatives include no chest pain, focal weakness, leg pain, myalgias or shortness of breath. Current antihyperlipidemic treatment includes statins. The current treatment provides significant improvement of lipids. There are no compliance problems.  Risk factors for coronary artery disease include hypertension and post-menopausal.  Hypothyroidism This is a chronic problem. Doing well. No complaint of fatigue Depression This is a chronic problem. Doing well with current meds. No Suicidal thoughts. Chronic back pain  Has had 2 back surgeries by dr. Vertell Limber- she takes hydrocodone on occasion when pain is bad. She is about to run out of hydrocodone.  Review of Systems  Constitutional: Negative for malaise/fatigue.  Respiratory: Negative for shortness of breath.   Cardiovascular: Negative for chest pain and palpitations.  Musculoskeletal: Negative for myalgias.  Neurological: Negative  for focal weakness.       Objective:   Physical Exam  Constitutional: She is oriented to person, place, and time. She appears well-developed and well-nourished.  HENT:  Head: Normocephalic.  Right Ear: Hearing, tympanic membrane, external ear and ear canal normal.  Left Ear: Hearing, tympanic membrane, external ear and ear canal normal.  Nose: Nose normal.  Mouth/Throat: Uvula is midline and oropharynx is clear and moist.  Eyes: Conjunctivae and EOM are normal. Pupils are equal, White, and reactive to light.  Neck: Trachea normal, normal range of motion and full passive range of motion without pain. Neck supple. No JVD present. Carotid bruit is not present. No mass and no thyromegaly present.  Cardiovascular: Normal rate, regular rhythm, normal heart sounds and intact distal pulses.  Exam reveals no gallop and no friction rub.   No murmur heard. Pulmonary/Chest: Effort normal and breath sounds normal. Right breast exhibits no inverted nipple, no mass, no nipple discharge, no skin change and no tenderness. Left breast exhibits no inverted nipple, no mass, no nipple discharge, no skin change and no tenderness.  Abdominal: Soft. Bowel sounds are normal. She exhibits no distension and no mass. There is no tenderness.  Genitourinary: No breast swelling, tenderness, discharge or bleeding.  Musculoskeletal: Normal range of motion.  3 2cm nontender nodules in palm of right hand  Lymphadenopathy:    She has no cervical adenopathy.  Neurological: She is alert and oriented to person, place, and time. She has normal reflexes.  Skin: Skin is warm and dry.  Psychiatric: She has a normal mood and affect. Her behavior is normal. Judgment and thought content normal.    BP 136/87  Pulse 85  Temp(Src) 97.4 F (36.3 C) (Oral)  Ht 5' 6.5" (1.689 m)  Wt 164 lb  3.2 oz (74.481 kg)  BMI 26.11 kg/m2         Assessment & Plan:    1. Hypothyroidism due to acquired atrophy of thyroid - SYNTHROID 88  MCG tablet; Take 1 tablet (88 mcg total) by mouth daily before breakfast.  Dispense: 90 tablet; Refill: 1 - Thyroid Panel With TSH  2. Hyperlipidemia with target LDL less than 130 - pravastatin (PRAVACHOL) 40 MG tablet; Take 1 tablet (40 mg total) by mouth daily.  Dispense: 90 tablet; Refill: 1 - NMR, lipoprofile  3. Essential hypertension, benign - metoprolol succinate (TOPROL-XL) 100 MG 24 hr tablet; Take 1 tablet (100 mg total) by mouth daily. Take with or immediately following a meal.  Dispense: 90 tablet; Refill: 1 - losartan-hydrochlorothiazide (HYZAAR) 100-12.5 MG per tablet; Take 1 tablet by mouth daily.  Dispense: 90 tablet; Refill: 1 - CMP14+EGFR  4. Depression Stress management  5. Screening for osteoporosis - DG Bone Density; Future  6. Insomnia - amitriptyline (ELAVIL) 50 MG tablet; Take 1 tablet (50 mg total) by mouth at bedtime.  Dispense: 90 tablet; Refill: 1  7. Chronic back pain - HYDROcodone-acetaminophen (NORCO/VICODIN) 5-325 MG per tablet; Take 1 tablet by mouth every 6 (six) hours as needed for moderate pain.  Dispense: 40 tablet; Refill: 0   Encouraged to do hemocult cards Labs pending Health maintenance reviewed Diet and exercise encouraged Continue all meds Follow up  In 3 months   [place], FNP  \

## 2014-07-07 ENCOUNTER — Ambulatory Visit: Payer: Medicare PPO | Admitting: Nurse Practitioner

## 2014-07-07 LAB — NMR, LIPOPROFILE
CHOLESTEROL: 198 mg/dL (ref 100–199)
HDL Cholesterol by NMR: 70 mg/dL (ref >39)
HDL PARTICLE NUMBER: 45.4 umol/L (ref >=30.5)
LDL PARTICLE NUMBER: 1371 nmol/L — AB (ref <1000)
LDL Size: 20.7 nm (ref >20.5)
LDLC SERPL CALC-MCNC: 102 mg/dL — AB (ref 0–99)
LP-IR Score: 41 (ref <=45)
Small LDL Particle Number: 403 nmol/L (ref <=527)
TRIGLYCERIDES BY NMR: 130 mg/dL (ref 0–149)

## 2014-07-07 LAB — CMP14+EGFR
ALBUMIN: 5 g/dL — AB (ref 3.6–4.8)
ALT: 16 IU/L (ref 0–32)
AST: 35 IU/L (ref 0–40)
Albumin/Globulin Ratio: 2 (ref 1.1–2.5)
Alkaline Phosphatase: 86 IU/L (ref 39–117)
BILIRUBIN TOTAL: 0.5 mg/dL (ref 0.0–1.2)
BUN/Creatinine Ratio: 19 (ref 11–26)
BUN: 15 mg/dL (ref 8–27)
CHLORIDE: 95 mmol/L — AB (ref 97–108)
CO2: 25 mmol/L (ref 18–29)
CREATININE: 0.78 mg/dL (ref 0.57–1.00)
Calcium: 10 mg/dL (ref 8.7–10.3)
GFR calc non Af Amer: 78 mL/min/{1.73_m2} (ref >59)
GFR, EST AFRICAN AMERICAN: 90 mL/min/{1.73_m2} (ref >59)
GLOBULIN, TOTAL: 2.5 g/dL (ref 1.5–4.5)
GLUCOSE: 112 mg/dL — AB (ref 65–99)
Potassium: 4.4 mmol/L (ref 3.5–5.2)
Sodium: 136 mmol/L (ref 134–144)
TOTAL PROTEIN: 7.5 g/dL (ref 6.0–8.5)

## 2014-07-07 LAB — THYROID PANEL WITH TSH
Free Thyroxine Index: 3.1 (ref 1.2–4.9)
T3 UPTAKE RATIO: 29 % (ref 24–39)
T4 TOTAL: 10.7 ug/dL (ref 4.5–12.0)
TSH: 0.546 u[IU]/mL (ref 0.450–4.500)

## 2014-10-04 ENCOUNTER — Other Ambulatory Visit: Payer: Medicare PPO

## 2014-10-04 ENCOUNTER — Ambulatory Visit: Payer: Medicare PPO

## 2014-10-17 ENCOUNTER — Encounter: Payer: Self-pay | Admitting: *Deleted

## 2014-11-01 ENCOUNTER — Ambulatory Visit (INDEPENDENT_AMBULATORY_CARE_PROVIDER_SITE_OTHER): Payer: Medicare PPO | Admitting: Pharmacist

## 2014-11-01 ENCOUNTER — Encounter: Payer: Self-pay | Admitting: Pharmacist

## 2014-11-01 ENCOUNTER — Ambulatory Visit (INDEPENDENT_AMBULATORY_CARE_PROVIDER_SITE_OTHER): Payer: Medicare PPO

## 2014-11-01 VITALS — Ht 67.0 in | Wt 162.0 lb

## 2014-11-01 DIAGNOSIS — E034 Atrophy of thyroid (acquired): Secondary | ICD-10-CM

## 2014-11-01 DIAGNOSIS — Z13820 Encounter for screening for osteoporosis: Secondary | ICD-10-CM

## 2014-11-01 DIAGNOSIS — E038 Other specified hypothyroidism: Secondary | ICD-10-CM

## 2014-11-01 NOTE — Patient Instructions (Signed)
Exercise for Strong Bones  Exercise is important to build and maintain strong bones / bone density.  There are 2 types of exercises that are important to building and maintaining strong bones:  Weight- bearing and muscle-stregthening.  Weight-bearing Exercises  These exercises include activities that make you move against gravity while staying upright. Weight-bearing exercises can be high-impact or low-impact.  High-impact weight-bearing exercises help build bones and keep them strong. If you have broken a bone due to osteoporosis or are at risk of breaking a bone, you may need to avoid high-impact exercises. If you're not sure, you should check with your healthcare provider.  Examples of high-impact weight-bearing exercises are: Dancing  Doing high-impact aerobics  Hiking  Jogging/running  Jumping Rope  Stair climbing  Tennis  Low-impact weight-bearing exercises can also help keep bones strong and are a safe alternative if you cannot do high-impact exercises.   Examples of low-impact weight-bearing exercises are: Using elliptical training machines  Doing low-impact aerobics  Using stair-step machines  Fast walking on a treadmill or outside   Muscle-Strengthening Exercises These exercises include activities where you move your body, a weight or some other resistance against gravity. They are also known as resistance exercises and include: Lifting weights  Using elastic exercise bands  Using weight machines  Lifting your own body weight  Functional movements, such as standing and rising up on your toes  Yoga and Pilates can also improve strength, balance and flexibility. However, certain positions may not be safe for people with osteoporosis or those at increased risk of broken bones. For example, exercises that have you bend forward may increase the chance of breaking a bone in the spine.   Non-Impact Exercises There are other types of exercises that can help  prevent falls.  Non-impact exercises can help you to improve balance, posture and how well you move in everyday activities. Some of these exercises include: Balance exercises that strengthen your legs and test your balance, such as Tai Chi, can decrease your risk of falls.  Posture exercises that improve your posture and reduce rounded or "sloping" shoulders can help you decrease the chance of breaking a bone, especially in the spine.  Functional exercises that improve how well you move can help you with everyday activities and decrease your chance of falling and breaking a bone. For example, if you have trouble getting up from a chair or climbing stairs, you should do these activities as exercises.   **A physical therapist can teach you balance, posture and functional exercises. He/she can also help you learn which exercises are safe and appropriate for you.  [facility] has a physical therapy office in [place] in front of our office and referrals can be made for assessments and treatment as needed and strength and balance training.  If you would like to have an assessment with [facility] and our physical therapy team please let a nurse or provider know.

## 2014-11-01 NOTE — Progress Notes (Signed)
Patient ID: Jasmine White, female   DOB: [birth year], 69 y.o.   MRN: [id number]  [facility] Current Height:        Max Lifetime Height:  5\' 7"  Current Weight:         Ethnicity:Caucasian    HPI: Does pt already have a diagnosis of:  Osteopenia?  No Osteoporosis?  No  Back Pain?  Yes - history of back surgery  Kyphosis?  No Prior fracture?  No Med(s) for Osteoporosis/Osteopenia:  none Med(s) previously tried for Osteoporosis/Osteopenia:  none                                                             PMH: HRT? No Steroid Use?  No Thyroid med?  Yes History of cancer?  No History of digestive disorders (ie Crohn's)?  No Current or previous eating disorders?  No Last Vitamin D Result:  No checked in last 3 years Last GFR Result:  78 (07/06/2014)   FH/SH: Family history of osteoporosis?  No Parent with history of hip fracture?  No Family history of breast cancer?  No Exercise?  Yes   Smoking?  No Alcohol?  no    Calcium Assessment Calcium Intake  # of servings/day  Calcium mg  Milk (8 oz) 0  x  300  = 0  Yogurt (4 oz) 1 x  200 = 200mg   Cheese (1 oz) 1 x  200 = 200mg   Other Calcium sources   250mg   Ca supplement 0 = 0   Estimated calcium intake per day 650mg     DEXA Results Date of Test T-Score for AP Spine L1-L4 T-Score for Total Left Hip T-Score for Total Right Hip  11/01/2014 2.5 0.3 0.5  09/27/2008 2.3 0.5 0.6  07/27/2006 0.6 0.3 0.4  05/01/2003 1.3 1.1 --    Assessment: Normal BMD - risk factors for checking BMD include therapy for hypothyroidism  Recommendations: 1.  Reviewed BMD results and discussed estimated fracture risk 2.  recommend calcium 1200mg  daily through supplementation or diet.  3.  continue weight bearing exercise - 30 minutes at least 4 days per week.   4.  Counseled and educated about fall risk and prevention.  Recheck DEXA:  2 years  Time spent counseling patient:  20 minutes   Cherre Robins, PharmD,  CPP

## 2014-11-02 ENCOUNTER — Telehealth: Payer: Self-pay

## 2014-11-02 DIAGNOSIS — R590 Localized enlarged lymph nodes: Secondary | ICD-10-CM

## 2014-11-02 NOTE — Telephone Encounter (Signed)
Patient said you were suppose to put in for knot on knot  Ultrasound

## 2014-11-14 ENCOUNTER — Ambulatory Visit (HOSPITAL_COMMUNITY): Payer: Medicare PPO

## 2014-11-15 ENCOUNTER — Ambulatory Visit (HOSPITAL_COMMUNITY)
Admission: RE | Admit: 2014-11-15 | Discharge: 2014-11-15 | Disposition: A | Discharge status: Home / Self Care | Payer: Medicare PPO | Source: Ambulatory Visit | Attending: Nurse Practitioner | Admitting: Nurse Practitioner

## 2014-11-15 DIAGNOSIS — R590 Localized enlarged lymph nodes: Secondary | ICD-10-CM

## 2014-11-15 DIAGNOSIS — R599 Enlarged lymph nodes, unspecified: Secondary | ICD-10-CM | POA: Insufficient documentation

## 2014-12-26 ENCOUNTER — Ambulatory Visit (INDEPENDENT_AMBULATORY_CARE_PROVIDER_SITE_OTHER): Payer: Medicare PPO | Admitting: Nurse Practitioner

## 2014-12-26 ENCOUNTER — Ambulatory Visit (INDEPENDENT_AMBULATORY_CARE_PROVIDER_SITE_OTHER): Payer: Medicare PPO

## 2014-12-26 ENCOUNTER — Encounter: Payer: Self-pay | Admitting: Nurse Practitioner

## 2014-12-26 VITALS — BP 144/82 | HR 87 | Temp 97.9°F | Ht 67.0 in | Wt 168.8 lb

## 2014-12-26 DIAGNOSIS — E039 Hypothyroidism, unspecified: Secondary | ICD-10-CM | POA: Diagnosis not present

## 2014-12-26 DIAGNOSIS — F329 Major depressive disorder, single episode, unspecified: Secondary | ICD-10-CM

## 2014-12-26 DIAGNOSIS — E038 Other specified hypothyroidism: Secondary | ICD-10-CM

## 2014-12-26 DIAGNOSIS — G4700 Insomnia, unspecified: Secondary | ICD-10-CM | POA: Diagnosis not present

## 2014-12-26 DIAGNOSIS — Z01419 Encounter for gynecological examination (general) (routine) without abnormal findings: Secondary | ICD-10-CM | POA: Diagnosis not present

## 2014-12-26 DIAGNOSIS — G8929 Other chronic pain: Secondary | ICD-10-CM

## 2014-12-26 DIAGNOSIS — Z1211 Encounter for screening for malignant neoplasm of colon: Secondary | ICD-10-CM

## 2014-12-26 DIAGNOSIS — E034 Atrophy of thyroid (acquired): Secondary | ICD-10-CM

## 2014-12-26 DIAGNOSIS — I10 Essential (primary) hypertension: Secondary | ICD-10-CM | POA: Diagnosis not present

## 2014-12-26 DIAGNOSIS — M549 Dorsalgia, unspecified: Secondary | ICD-10-CM | POA: Diagnosis not present

## 2014-12-26 DIAGNOSIS — E0789 Other specified disorders of thyroid: Secondary | ICD-10-CM | POA: Diagnosis not present

## 2014-12-26 DIAGNOSIS — E559 Vitamin D deficiency, unspecified: Secondary | ICD-10-CM | POA: Diagnosis not present

## 2014-12-26 DIAGNOSIS — Z0000 Encounter for general adult medical examination without abnormal findings: Secondary | ICD-10-CM | POA: Diagnosis not present

## 2014-12-26 DIAGNOSIS — E785 Hyperlipidemia, unspecified: Secondary | ICD-10-CM | POA: Diagnosis not present

## 2014-12-26 DIAGNOSIS — F32A Depression, unspecified: Secondary | ICD-10-CM

## 2014-12-26 MED ORDER — LOSARTAN POTASSIUM-HCTZ 100-12.5 MG PO TABS: 1.0000 | ORAL_TABLET | Freq: Every day | ORAL | Status: DC

## 2014-12-26 MED ORDER — HYDROCODONE-ACETAMINOPHEN 5-325 MG PO TABS: 1.0000 | ORAL_TABLET | Freq: Four times a day (QID) | ORAL | Status: DC | PRN

## 2014-12-26 MED ORDER — METOPROLOL SUCCINATE ER 100 MG PO TB24: 100.0000 mg | ORAL_TABLET | Freq: Every day | ORAL | Status: DC

## 2014-12-26 MED ORDER — PRAVASTATIN SODIUM 40 MG PO TABS: 40.0000 mg | ORAL_TABLET | Freq: Every day | ORAL | Status: DC

## 2014-12-26 MED ORDER — AMITRIPTYLINE HCL 50 MG PO TABS: 50.0000 mg | ORAL_TABLET | Freq: Every day | ORAL | Status: DC

## 2014-12-26 MED ORDER — SYNTHROID 88 MCG PO TABS: 88.0000 ug | ORAL_TABLET | Freq: Every day | ORAL | Status: DC

## 2014-12-26 NOTE — Patient Instructions (Addendum)
Pap Test Information  A Pap test is a medical test done to evaluate cells that are on the surface of the cervix. The cervix is the lower portion of the uterus and upper portion of the vagina. This test is done in a clinic office and can detect certain infections, abnormal cervical cells, or cervical cancer. For some women, the cervical region has the potential to form cancer. With consistent evaluations, cervical cancer is a rare and preventable kind of cancer.   DIFFERENT WAYS TO PERFORM A PAP TEST  There are 2 ways a Pap test may be done. The main difference between the 2 ways has to do with what instruments are used to get a cell sample and how the cells are tested. In both tests, a warm metal or plastic instrument (speculum) is placed in the vagina. The speculum allows the caregiver to see the inside of the vagina and to look at the cervix. The caregiver will use either a wooden spatula or plastic brush to collect cervical cells. If a wooden spatula was used, the cervical cells are placed on a glass slide and sent to the lab to be examined. If a plastic brush was used, the cervical cells are retrieved and both the plastic brush and cells are placed in a container with fluid, and the cells are examined. In addition, the fluid in the container can also be checked for human papillomavirus (HPV) and other infections, such as gonorrhea and chlamydia.  RISK FACTORS FOR CERVICAL CANCER   Risk factors for cervical cancer are things that increase your chance of getting cervical cancer. These things include:  · Having had an abnormal Pap test or cancer of the vagina or vulva and avoiding proper medical follow up and care.  · Becoming sexually active before age 18.  · Smoking.  · Having a weakened immune system. HIV or other immunodeficiency disorders weaken the immune system.    · Having had a sexually transmitted infection (STI), such as chlamydia, gonorrhea, or HPV.    · Having a sexual partner who has a history of  direct or indirect contact with cervical cancer.    · Not using condoms with new sexual partners.    · Being the daughter of a woman who took diethylstilbestrol (DES) during pregnancy.  HOW TO PREPARE FOR A PAP TEST  · Schedule your Pap test for a time when you are not likely to be on your period, or ask if it is okay to be on your period during the Pap test. Depending on the method of Pap test collection, your caregiver may not be able to perform the test if you are on your period or if your period is very heavy.  · Avoid douching before the Pap test. This may lessen the number of cells that are being evaluated.  · Refrain from intercourse the day before your Pap test, or as directed by your caregiver.  If you have never had a Pap test, ask your caregiver how it is done. He or she may take extra time to show and explain the speculum and collecting system used during the test.   HOW OFTEN SHOULD YOU HAVE PAP TESTS?  How often you should get a Pap test depends on your age and health history. Talk to your caregiver about how often you should get tested.   PAP RECOMMENDATIONS BASED ON AGE  The recommendations below are the same for women who have or have not gotten the vaccine for HPV. Some women may need   screenings more often if they are at high risk for cervical cancer.   · The first Pap test should be done at age 21.  · Between ages 21 and 29, have a Pap test every 2 or 3 years.  · If you are 70 years old and your last 3 Pap tests have been normal, Pap tests are needed no more often than every 3 years. This should be approved by your caregiver.  · If you are 65 years old or older, ask your caregiver if you can stop having Pap tests.  PAP TEST RECOMMENDATIONS BASED ON HEALTH HISTORY  Confirm the recommendations below with your caregiver. It is recommended that:  · You have your first Pap test before the age of 21 if you are sexually active or are immunocompromised (HIV, lupus, or taking transplant medicines).  · You  have more frequent Pap tests if you have a weakened immune system due to HIV, lupus, or you are taking transplant medicines; if your mother was exposed to DES while pregnant with you; or if you have cervical intraepithelial neoplasia (CIN) 2 or 3.  · You have a Pap test and screenings for cancer for at least 20 years after treatment for cervical cancer or a condition that could lead to cancer. Talk to your caregiver if a new problem develops soon after your last Pap test.  YOU DO NOT NEED REGULAR PAP TESTS IF:   · You had a hysterectomy for a problem that was not a cancer or a condition that could lead to cancer. However, even if you no longer need a Pap test, a regular exam is a good idea to make sure other serious problems are not starting.  · If you are between ages 65 and 70, and you have had normal Pap tests for 10 years, you no longer need Pap tests. However, even if you no longer need a Pap test, a regular exam is a good idea to make sure other serious problems are not starting.  If Pap tests have been discontinued, risk factors (such as a new sexual partner) need to be re-evaluated to determine if screening should be resumed.  RESULTS OF YOUR PAP TEST  A healthy Pap test shows no abnormal cells or evidence of infection. However, inflammation of the cervix (cervicitis) is a common Pap test result but is of no significance.   The presence of abnormally growing cells on the surface of the cervix may be reported as an abnormal Pap test. If a Pap test is abnormal, it is most often a result of a previous exposure to HPV. HPV may infect the cells of the cervix and cause dysplasia. Dysplasia is where the cells no longer look normal. If a person has been diagnosed with high-grade dysplasia or severe dysplasia, they are at higher risk of developing cervical cancer. A woman diagnosed with low-grade dysplasia should still be seen by her caregiver. Low-grade dysplasia still has a small risk of developing into cancer.  Your caregiver will determine what follow-up care is needed or when you should have your next Pap test.    If you have had an abnormal Pap test:    · You may be asked to have a colposcopy. This is a test in which the cervix is viewed with a lighted microscope.    · A cervical tissue sample (biopsy) may also be needed. This involves taking a small tissue sample from the cervix.  Document Released: 12/01/2011 Document Reviewed: 12/01/2011  [employer]® Patient Information ©2015 [employer].

## 2014-12-26 NOTE — Progress Notes (Signed)
Subjective:    Patient ID: Jasmine White, female    DOB: 01-09-1946, 69 y.o.   MRN: [id number]  Patient here today for CPE with pap. No acute complaints.   Hypertension This is a chronic problem. The current episode started more than 1 year ago. The problem is controlled. Pertinent negatives include no anxiety, chest pain, palpitations or shortness of breath. There are no associated agents to hypertension. Risk factors for coronary artery disease include dyslipidemia and post-menopausal state. Past treatments include diuretics and beta blockers. The current treatment provides moderate improvement. There are no compliance problems.   Hyperlipidemia This is a chronic problem. The current episode started more than 1 year ago. The problem is controlled. There are no known factors aggravating her hyperlipidemia. Pertinent negatives include no chest pain, myalgias or shortness of breath. Current antihyperlipidemic treatment includes statins. The current treatment provides no improvement of lipids. There are no compliance problems.  Risk factors for coronary artery disease include dyslipidemia, hypertension and post-menopausal.  Hypothyroidism This is a chronic problem. Doing well. No complaint of fatigue Depression This is a chronic problem. Doing well with current meds. No Suicidal thoughts. Chronic back pain  Has had 2 back surgeries by dr. Vertell Limber- she takes hydrocodone on occasion when pain is bad. She is about to run out of hydrocodone.  Review of Systems  Respiratory: Negative for shortness of breath.   Cardiovascular: Negative for chest pain and palpitations.  Musculoskeletal: Negative for myalgias.       Objective:   Physical Exam  Constitutional: She is oriented to person, place, and time. She appears well-developed and well-nourished.  HENT:  Head: Normocephalic.  Right Ear: Hearing, tympanic membrane, external ear and ear canal normal.  Left Ear: Hearing, tympanic membrane,  external ear and ear canal normal.  Nose: Nose normal.  Mouth/Throat: Uvula is midline and oropharynx is clear and moist.  Eyes: Conjunctivae and EOM are normal. Pupils are equal, White, and reactive to light.  Neck: Trachea normal, normal range of motion and full passive range of motion without pain. Neck supple. No JVD present. Carotid bruit is not present. No thyroid mass and no thyromegaly present.  Cardiovascular: Normal rate, regular rhythm, normal heart sounds and intact distal pulses.  Exam reveals no gallop and no friction rub.   No murmur heard. Pulmonary/Chest: Effort normal and breath sounds normal. Right breast exhibits no inverted nipple, no mass, no nipple discharge, no skin change and no tenderness. Left breast exhibits no inverted nipple, no mass, no nipple discharge, no skin change and no tenderness.  Abdominal: Soft. Bowel sounds are normal. She exhibits no distension and no mass. There is no tenderness.  Genitourinary: Vagina normal and uterus normal. No breast swelling, tenderness, discharge or bleeding.  bimanual exam-No adnexal masses or tenderness. Vaginal cuff intact  Musculoskeletal: Normal range of motion.  Lymphadenopathy:    She has no cervical adenopathy.  Neurological: She is alert and oriented to person, place, and time. She has normal reflexes.  Skin: Skin is warm and dry.  Psychiatric: She has a normal mood and affect. Her behavior is normal. Judgment and thought content normal.    BP 144/82 mmHg  Pulse 87  Temp(Src) 97.9 F (36.6 C) (Oral)  Ht '5\' 7"'  (1.702 m)  Wt 168 lb 12.8 oz (76.567 kg)  BMI 26.43 kg/m2  EKG- NSR-[name] Hassell Done, FNP   Chest x ray- prominent lung markings-Preliminary reading by Ronnald Collum, FNP  [facility]  Assessment & Plan:    1. Encounter for routine gynecological examination - Pap IG (Image Guided)  2. Encounter for annual physical exam - CBC with Differential/Platelet - Vit D  25 hydroxy (rtn osteoporosis  monitoring)  3. Essential hypertension, benign Do not add salt to diet - NMR, lipoprofile - losartan-hydrochlorothiazide (HYZAAR) 100-12.5 MG per tablet; Take 1 tablet by mouth daily.  Dispense: 90 tablet; Refill: 1 - metoprolol succinate (TOPROL-XL) 100 MG 24 hr tablet; Take 1 tablet (100 mg total) by mouth daily. Take with or immediately following a meal.  Dispense: 90 tablet; Refill: 1 - DG Chest 2 View; Future - EKG 12-Lead  4. Depression Stress management  5. Chronic back pain - HYDROcodone-acetaminophen (NORCO/VICODIN) 5-325 MG per tablet; Take 1 tablet by mouth every 6 (six) hours as needed for moderate pain.  Dispense: 40 tablet; Refill: 0  6. Hyperlipidemia with target LDL less than 130 Low fat diet - CMP14+EGFR - pravastatin (PRAVACHOL) 40 MG tablet; Take 1 tablet (40 mg total) by mouth daily.  Dispense: 90 tablet; Refill: 1  7. Hypothyroidism due to acquired atrophy of thyroid - Thyroid Panel With TSH - SYNTHROID 88 MCG tablet; Take 1 tablet (88 mcg total) by mouth daily before breakfast.  Dispense: 90 tablet; Refill: 1  8. Encounter for screening colonoscopy - Ambulatory referral to Gastroenterology  9. Insomnia Bedtime ritual - amitriptyline (ELAVIL) 50 MG tablet; Take 1 tablet (50 mg total) by mouth at bedtime.  Dispense: 90 tablet; Refill: 1    Labs pending Health maintenance reviewed Diet and exercise encouraged Continue all meds Follow up  In 6 months   [place], FNP

## 2014-12-27 ENCOUNTER — Encounter: Payer: Self-pay | Admitting: Internal Medicine

## 2014-12-27 LAB — THYROID PANEL WITH TSH
FREE THYROXINE INDEX: 2.6 (ref 1.2–4.9)
T3 Uptake Ratio: 28 % (ref 24–39)
T4, Total: 9.4 ug/dL (ref 4.5–12.0)
TSH: 0.799 u[IU]/mL (ref 0.450–4.500)

## 2014-12-27 LAB — NMR, LIPOPROFILE
CHOLESTEROL: 190 mg/dL (ref 100–199)
HDL CHOLESTEROL BY NMR: 75 mg/dL (ref >39)
HDL Particle Number: 45.9 umol/L (ref >=30.5)
LDL Particle Number: 1022 nmol/L — ABNORMAL HIGH (ref <1000)
LDL SIZE: 20.5 nm (ref >20.5)
LDL-C: 96 mg/dL (ref 0–99)
LP-IR Score: 33 (ref <=45)
SMALL LDL PARTICLE NUMBER: 377 nmol/L (ref <=527)
Triglycerides by NMR: 97 mg/dL (ref 0–149)

## 2014-12-27 LAB — CMP14+EGFR
A/G RATIO: 2 (ref 1.1–2.5)
ALBUMIN: 4.8 g/dL (ref 3.6–4.8)
ALT: 13 IU/L (ref 0–32)
AST: 37 IU/L (ref 0–40)
Alkaline Phosphatase: 100 IU/L (ref 39–117)
BILIRUBIN TOTAL: 0.4 mg/dL (ref 0.0–1.2)
BUN / CREAT RATIO: 21 (ref 11–26)
BUN: 15 mg/dL (ref 8–27)
CO2: 28 mmol/L (ref 18–29)
Calcium: 10 mg/dL (ref 8.7–10.3)
Chloride: 95 mmol/L — ABNORMAL LOW (ref 97–108)
Creatinine, Ser: 0.71 mg/dL (ref 0.57–1.00)
GFR, EST AFRICAN AMERICAN: 100 mL/min/{1.73_m2} (ref >59)
GFR, EST NON AFRICAN AMERICAN: 87 mL/min/{1.73_m2} (ref >59)
GLOBULIN, TOTAL: 2.4 g/dL (ref 1.5–4.5)
Glucose: 103 mg/dL — ABNORMAL HIGH (ref 65–99)
Potassium: 4.7 mmol/L (ref 3.5–5.2)
Sodium: 138 mmol/L (ref 134–144)
Total Protein: 7.2 g/dL (ref 6.0–8.5)

## 2014-12-27 LAB — CBC WITH DIFFERENTIAL/PLATELET
BASOS ABS: 0.1 10*3/uL (ref 0.0–0.2)
Basos: 1 %
Eos: 4 %
Eosinophils Absolute: 0.2 10*3/uL (ref 0.0–0.4)
HEMATOCRIT: 40.4 % (ref 34.0–46.6)
Hemoglobin: 13.5 g/dL (ref 11.1–15.9)
Immature Grans (Abs): 0 10*3/uL (ref 0.0–0.1)
Immature Granulocytes: 0 %
LYMPHS ABS: 1.5 10*3/uL (ref 0.7–3.1)
Lymphs: 23 %
MCH: 29.3 pg (ref 26.6–33.0)
MCHC: 33.4 g/dL (ref 31.5–35.7)
MCV: 88 fL (ref 79–97)
MONOS ABS: 0.5 10*3/uL (ref 0.1–0.9)
Monocytes: 8 %
NEUTROS ABS: 4 10*3/uL (ref 1.4–7.0)
Neutrophils Relative %: 64 %
Platelets: 267 10*3/uL (ref 150–379)
RBC: 4.6 x10E6/uL (ref 3.77–5.28)
RDW: 13.3 % (ref 12.3–15.4)
WBC: 6.3 10*3/uL (ref 3.4–10.8)

## 2014-12-27 LAB — VITAMIN D 25 HYDROXY (VIT D DEFICIENCY, FRACTURES): VIT D 25 HYDROXY: 47.1 ng/mL (ref 30.0–100.0)

## 2014-12-28 LAB — PAP IG (IMAGE GUIDED): PAP Smear Comment: 0

## 2015-02-02 ENCOUNTER — Encounter: Payer: Self-pay | Admitting: Internal Medicine

## 2015-03-06 ENCOUNTER — Encounter: Payer: Medicare PPO | Admitting: Internal Medicine

## 2015-04-03 ENCOUNTER — Ambulatory Visit (AMBULATORY_SURGERY_CENTER): Payer: Medicare PPO

## 2015-04-03 VITALS — Ht 66.0 in | Wt 172.8 lb

## 2015-04-03 DIAGNOSIS — Z1211 Encounter for screening for malignant neoplasm of colon: Secondary | ICD-10-CM

## 2015-04-03 NOTE — Progress Notes (Signed)
Per pt, no allergies to soy or egg products.Pt not taking any weight loss meds or using  O2 at home. 

## 2015-04-17 ENCOUNTER — Encounter: Payer: Self-pay | Admitting: Internal Medicine

## 2015-04-17 ENCOUNTER — Ambulatory Visit (AMBULATORY_SURGERY_CENTER): Payer: Medicare PPO | Admitting: Internal Medicine

## 2015-04-17 VITALS — BP 139/85 | HR 64 | Temp 96.6°F | Resp 19 | Ht 66.0 in | Wt 172.0 lb

## 2015-04-17 DIAGNOSIS — I10 Essential (primary) hypertension: Secondary | ICD-10-CM | POA: Diagnosis not present

## 2015-04-17 DIAGNOSIS — Z1211 Encounter for screening for malignant neoplasm of colon: Secondary | ICD-10-CM

## 2015-04-17 MED ORDER — SODIUM CHLORIDE 0.9 % IV SOLN: 500.0000 mL | INTRAVENOUS | Status: DC

## 2015-04-17 NOTE — Op Note (Signed)
[place]  [address] [place], [id number]   COLONOSCOPY PROCEDURE REPORT  PATIENT: Jasmine, White  MR#: [id number] BIRTHDATE: [birth year] , 26  yrs. old GENDER: female ENDOSCOPIST: Gatha Mayer, MD, [facility] PROCEDURE DATE:  04/17/2015 PROCEDURE:   Colonoscopy, screening First Screening Colonoscopy - Avg.  risk and is 50 yrs.  old or older Yes.  Prior Negative Screening - Now for repeat screening. N/A  History of Adenoma - Now for follow-up colonoscopy & has been > or = to 3 yrs.  N/A  Polyps removed today? No Recommend repeat exam, <10 yrs? No ASA CLASS:   Class II INDICATIONS:Screening for colonic neoplasia and Colorectal Neoplasm Risk Assessment for this procedure is average risk. MEDICATIONS: Propofol 200 mg IV and Monitored anesthesia care  DESCRIPTION OF PROCEDURE:   After the risks benefits and alternatives of the procedure were thoroughly explained, informed consent was obtained.  The digital rectal exam revealed no abnormalities of the rectum.   The LB [id number]  endoscope was introduced through the anus and advanced to the cecum, which was identified by both the appendix and ileocecal valve. No adverse events experienced.   The quality of the prep was good.  (MiraLax was used)  The instrument was then slowly withdrawn as the colon was fully examined. Estimated blood loss is zero unless otherwise noted in this procedure report.      COLON FINDINGS: A normal appearing cecum, ileocecal valve, and appendiceal orifice were identified.  The ascending, transverse, descending, sigmoid colon, and rectum appeared unremarkable. Retroflexed views revealed no abnormalities. The time to cecum = 3.9 Withdrawal time = 11.4   The scope was withdrawn and the procedure completed. COMPLICATIONS: There were no immediate complications.  ENDOSCOPIC IMPRESSION: Normal colonoscopy  RECOMMENDATIONS: Routine repeat colonoscopy screening not necessary.  See  me/GI as needed.  eSigned:  Gatha Mayer, MD, [facility] 04/17/2015 10:14 AM   cc: Chevis Pretty, NP and The Patient

## 2015-04-17 NOTE — Patient Instructions (Addendum)
Your colonoscopy was normal with a good prep!  I do not think you need another routine colonoscopy.  I appreciate the opportunity to care for you. Gatha Mayer, MD, FACG  YOU HAD AN ENDOSCOPIC PROCEDURE TODAY AT [facility]:   Refer to the procedure report that was given to you for any specific questions about what was found during the examination.  If the procedure report does not answer your questions, please call your gastroenterologist to clarify.  If you requested that your care partner not be given the details of your procedure findings, then the procedure report has been included in a sealed envelope for you to review at your convenience later.  YOU SHOULD EXPECT: Some feelings of bloating in the abdomen. Passage of more gas than usual.  Walking can help get rid of the air that was put into your GI tract during the procedure and reduce the bloating. If you had a lower endoscopy (such as a colonoscopy or flexible sigmoidoscopy) you may notice spotting of blood in your stool or on the toilet paper. If you underwent a bowel prep for your procedure, you may not have a normal bowel movement for a few days.  Please Note:  You might notice some irritation and congestion in your nose or some drainage.  This is from the oxygen used during your procedure.  There is no need for concern and it should clear up in a day or so.  SYMPTOMS TO REPORT IMMEDIATELY:   Following lower endoscopy (colonoscopy or flexible sigmoidoscopy):  Excessive amounts of blood in the stool  Significant tenderness or worsening of abdominal pains  Swelling of the abdomen that is new, acute  Fever of 100F or higher  For urgent or emergent issues, a gastroenterologist can be reached at any hour by calling [phone].   DIET: Your first meal following the procedure should be a small meal and then it is ok to progress to your normal diet. Heavy or fried foods are harder to digest and may make  you feel nauseous or bloated.  Likewise, meals heavy in dairy and vegetables can increase bloating.  Drink plenty of fluids but you should avoid alcoholic beverages for 24 hours.  ACTIVITY:  You should plan to take it easy for the rest of today and you should NOT DRIVE or use heavy machinery until tomorrow (because of the sedation medicines used during the test).    FOLLOW UP: Our staff will call the number listed on your records the next business day following your procedure to check on you and address any questions or concerns that you may have regarding the information given to you following your procedure. If we do not reach you, we will leave a message.  However, if you are feeling well and you are not experiencing any problems, there is no need to return our call.  We will assume that you have returned to your regular daily activities without incident.  If any biopsies were taken you will be contacted by phone or by letter within the next 1-3 weeks.  Please call us at [phone] if you have not heard about the biopsies in 3 weeks.    SIGNATURES/CONFIDENTIALITY: You and/or your care partner have signed paperwork which will be entered into your electronic medical record.  These signatures attest to the fact that that the information above on your After Visit Summary has been reviewed and is understood.  Full responsibility of the confidentiality of this  discharge information lies with you and/or your care-partner.

## 2015-04-17 NOTE — Progress Notes (Signed)
Transferred to recovery room. A/O x3, pleased with MAC.  VSS.  Report to April, RN. 

## 2015-04-18 ENCOUNTER — Telehealth: Payer: Self-pay | Admitting: *Deleted

## 2015-04-18 NOTE — Telephone Encounter (Signed)
  Follow up Call-  Call back number 04/17/2015  Post procedure Call Back phone  # [phone]  Permission to leave phone message Yes     Patient questions:  Do you have a fever, pain , or abdominal swelling? No. Pain Score  0 *  Have you tolerated food without any problems? Yes.    Have you been able to return to your normal activities? Yes.    Do you have any questions about your discharge instructions: Diet   No. Medications  No. Follow up visit  No.  Do you have questions or concerns about your Care? No.  Actions: * If pain score is 4 or above: No action needed, pain <4.

## 2015-06-29 ENCOUNTER — Encounter: Payer: Self-pay | Admitting: Nurse Practitioner

## 2015-06-29 ENCOUNTER — Ambulatory Visit (INDEPENDENT_AMBULATORY_CARE_PROVIDER_SITE_OTHER): Payer: Medicare PPO | Admitting: Nurse Practitioner

## 2015-06-29 VITALS — BP 132/79 | HR 80 | Temp 97.3°F | Ht 66.0 in | Wt 172.0 lb

## 2015-06-29 DIAGNOSIS — G4700 Insomnia, unspecified: Secondary | ICD-10-CM | POA: Diagnosis not present

## 2015-06-29 DIAGNOSIS — F329 Major depressive disorder, single episode, unspecified: Secondary | ICD-10-CM

## 2015-06-29 DIAGNOSIS — M549 Dorsalgia, unspecified: Secondary | ICD-10-CM

## 2015-06-29 DIAGNOSIS — F32A Depression, unspecified: Secondary | ICD-10-CM

## 2015-06-29 DIAGNOSIS — E785 Hyperlipidemia, unspecified: Secondary | ICD-10-CM

## 2015-06-29 DIAGNOSIS — E038 Other specified hypothyroidism: Secondary | ICD-10-CM | POA: Diagnosis not present

## 2015-06-29 DIAGNOSIS — G8929 Other chronic pain: Secondary | ICD-10-CM

## 2015-06-29 DIAGNOSIS — Z6827 Body mass index (BMI) 27.0-27.9, adult: Secondary | ICD-10-CM

## 2015-06-29 DIAGNOSIS — Z23 Encounter for immunization: Secondary | ICD-10-CM | POA: Diagnosis not present

## 2015-06-29 DIAGNOSIS — I10 Essential (primary) hypertension: Secondary | ICD-10-CM | POA: Diagnosis not present

## 2015-06-29 DIAGNOSIS — E034 Atrophy of thyroid (acquired): Secondary | ICD-10-CM | POA: Diagnosis not present

## 2015-06-29 MED ORDER — PRAVASTATIN SODIUM 40 MG PO TABS: 40.0000 mg | ORAL_TABLET | Freq: Every day | ORAL | Status: DC

## 2015-06-29 MED ORDER — AMITRIPTYLINE HCL 50 MG PO TABS: 50.0000 mg | ORAL_TABLET | Freq: Every day | ORAL | Status: DC

## 2015-06-29 MED ORDER — LOSARTAN POTASSIUM-HCTZ 100-12.5 MG PO TABS: 1.0000 | ORAL_TABLET | Freq: Every day | ORAL | Status: DC

## 2015-06-29 MED ORDER — HYDROCODONE-ACETAMINOPHEN 5-325 MG PO TABS: 1.0000 | ORAL_TABLET | Freq: Four times a day (QID) | ORAL | Status: DC | PRN

## 2015-06-29 MED ORDER — SYNTHROID 88 MCG PO TABS: 88.0000 ug | ORAL_TABLET | Freq: Every day | ORAL | Status: DC

## 2015-06-29 MED ORDER — METOPROLOL SUCCINATE ER 100 MG PO TB24: 100.0000 mg | ORAL_TABLET | Freq: Every day | ORAL | Status: DC

## 2015-06-29 NOTE — Progress Notes (Signed)
Subjective:    Patient ID: Jasmine White, female    DOB: 31-Jan-1946, 69 y.o.   MRN: [id number]  Patient here today for follow up of chronic medical problems.   Hypertension This is a chronic problem. The current episode started more than 1 year ago. The problem is controlled. Pertinent negatives include no anxiety, chest pain, palpitations or shortness of breath. There are no associated agents to hypertension. Risk factors for coronary artery disease include dyslipidemia and post-menopausal state. Past treatments include diuretics and beta blockers. The current treatment provides moderate improvement. There are no compliance problems.   Hyperlipidemia This is a chronic problem. The current episode started more than 1 year ago. The problem is controlled. There are no known factors aggravating her hyperlipidemia. Pertinent negatives include no chest pain, myalgias or shortness of breath. Current antihyperlipidemic treatment includes statins. The current treatment provides no improvement of lipids. There are no compliance problems.  Risk factors for coronary artery disease include dyslipidemia, hypertension and post-menopausal.  Hypothyroidism This is a chronic problem. Doing well. No complaint of fatigue Depression This is a chronic problem. Doing well with current meds. No Suicidal thoughts. Chronic back pain  Has had 2 back surgeries by dr. Vertell Limber- she takes hydrocodone on occasion when pain is bad. She is about to run out of hydrocodone.  Review of Systems  Respiratory: Negative for shortness of breath.   Cardiovascular: Negative for chest pain and palpitations.  Musculoskeletal: Negative for myalgias.       Objective:   Physical Exam  Constitutional: She is oriented to person, place, and time. She appears well-developed and well-nourished.  HENT:  Head: Normocephalic.  Right Ear: Hearing, tympanic membrane, external ear and ear canal normal.  Left Ear: Hearing, tympanic membrane,  external ear and ear canal normal.  Nose: Nose normal.  Mouth/Throat: Uvula is midline and oropharynx is clear and moist.  Eyes: Conjunctivae and EOM are normal. Pupils are equal, White, and reactive to light.  Neck: Trachea normal, normal range of motion and full passive range of motion without pain. Neck supple. No JVD present. Carotid bruit is not present. No thyroid mass and no thyromegaly present.  Cardiovascular: Normal rate, regular rhythm, normal heart sounds and intact distal pulses.  Exam reveals no gallop and no friction rub.   No murmur heard. Pulmonary/Chest: Effort normal and breath sounds normal. Right breast exhibits no inverted nipple, no mass, no nipple discharge, no skin change and no tenderness. Left breast exhibits no inverted nipple, no mass, no nipple discharge, no skin change and no tenderness.  Abdominal: Soft. Bowel sounds are normal. She exhibits no distension and no mass. There is no tenderness.  Genitourinary: No breast swelling, tenderness, discharge or bleeding.  Musculoskeletal: Normal range of motion.  Lymphadenopathy:    She has no cervical adenopathy.  Neurological: She is alert and oriented to person, place, and time. She has normal reflexes.  Skin: Skin is warm and dry.  Psychiatric: She has a normal mood and affect. Her behavior is normal. Judgment and thought content normal.    BP 132/79 mmHg  Pulse 80  Temp(Src) 97.3 F (36.3 C) (Oral)  Ht '5\' 6"'  (1.676 m)  Wt 172 lb (78.019 kg)  BMI 27.77 kg/m2        Assessment & Plan:   1. Essential hypertension, benign Do not add salt to diet - metoprolol succinate (TOPROL-XL) 100 MG 24 hr tablet; Take 1 tablet (100 mg total) by mouth daily. Take with or immediately following  a meal.  Dispense: 90 tablet; Refill: 1 - losartan-hydrochlorothiazide (HYZAAR) 100-12.5 MG tablet; Take 1 tablet by mouth daily.  Dispense: 90 tablet; Refill: 1 - CMP14+EGFR  2. Hypothyroidism due to acquired atrophy of thyroid -  SYNTHROID 88 MCG tablet; Take 1 tablet (88 mcg total) by mouth daily before breakfast.  Dispense: 90 tablet; Refill: 1 - Thyroid Panel With TSH  3. Depression Stress management  4. Hyperlipidemia with target LDL less than 130 Low fat diet - pravastatin (PRAVACHOL) 40 MG tablet; Take 1 tablet (40 mg total) by mouth daily.  Dispense: 90 tablet; Refill: 1 - Lipid panel  5. Chronic back pain Moist heat Continue to exercise  6. BMI 27.0-27.9,adult Discussed diet and exercise for person with BMI >25 Will recheck weight in 3-6 months  7. Insomnia Bedtime ritual - amitriptyline (ELAVIL) 50 MG tablet; Take 1 tablet (50 mg total) by mouth at bedtime.  Dispense: 90 tablet; Refill: 1   prevnar 13 today Labs pending Health maintenance reviewed Diet and exercise encouraged Continue all meds Follow up  In 3 months   [place], FNP

## 2015-06-29 NOTE — Addendum Note (Signed)
Addended by: Rolena Infante on: 06/29/2015 10:36 AM   Modules accepted: Orders

## 2015-06-29 NOTE — Patient Instructions (Signed)
Health Maintenance, Female Adopting a healthy lifestyle and getting preventive care can go a long way to promote health and wellness. Talk with your health care provider about what schedule of regular examinations is right for you. This is a good chance for you to check in with your provider about disease prevention and staying healthy. In between checkups, there are plenty of things you can do on your own. Experts have done a lot of research about which lifestyle changes and preventive measures are most likely to keep you healthy. Ask your health care provider for more information. WEIGHT AND DIET  Eat a healthy diet  Be sure to include plenty of vegetables, fruits, low-fat dairy products, and lean protein.  Do not eat a lot of foods high in solid fats, added sugars, or salt.  Get regular exercise. This is one of the most important things you can do for your health.  Most adults should exercise for at least 150 minutes each week. The exercise should increase your heart rate and make you sweat (moderate-intensity exercise).  Most adults should also do strengthening exercises at least twice a week. This is in addition to the moderate-intensity exercise.  Maintain a healthy weight  Body mass index (BMI) is a measurement that can be used to identify possible weight problems. It estimates body fat based on height and weight. Your health care provider can help determine your BMI and help you achieve or maintain a healthy weight.  For females 20 years of age and older:   A BMI below 18.5 is considered underweight.  A BMI of 18.5 to 24.9 is normal.  A BMI of 25 to 29.9 is considered overweight.  A BMI of 30 and above is considered obese.  Watch levels of cholesterol and blood lipids  You should start having your blood tested for lipids and cholesterol at 70 years of age, then have this test every 5 years.  You may need to have your cholesterol levels checked more often if:  Your lipid  or cholesterol levels are high.  You are older than 70 years of age.  You are at high risk for heart disease.  CANCER SCREENING   Lung Cancer  Lung cancer screening is recommended for adults 55-80 years old who are at high risk for lung cancer because of a history of smoking.  A yearly low-dose CT scan of the lungs is recommended for people who:  Currently smoke.  Have quit within the past 15 years.  Have at least a 30-pack-year history of smoking. A pack year is smoking an average of one pack of cigarettes a day for 1 year.  Yearly screening should continue until it has been 15 years since you quit.  Yearly screening should stop if you develop a health problem that would prevent you from having lung cancer treatment.  Breast Cancer  Practice breast self-awareness. This means understanding how your breasts normally appear and feel.  It also means doing regular breast self-exams. Let your health care provider know about any changes, no matter how small.  If you are in your 20s or 30s, you should have a clinical breast exam (CBE) by a health care provider every 1-3 years as part of a regular health exam.  If you are 40 or older, have a CBE every year. Also consider having a breast X-ray (mammogram) every year.  If you have a family history of breast cancer, talk to your health care provider about genetic screening.  If you   are at high risk for breast cancer, talk to your health care provider about having an MRI and a mammogram every year.  Breast cancer gene (BRCA) assessment is recommended for women who have family members with BRCA-related cancers. BRCA-related cancers include:  Breast.  Ovarian.  Tubal.  Peritoneal cancers.  Results of the assessment will determine the need for genetic counseling and BRCA1 and BRCA2 testing. Cervical Cancer Your health care provider may recommend that you be screened regularly for cancer of the pelvic organs (ovaries, uterus, and  vagina). This screening involves a pelvic examination, including checking for microscopic changes to the surface of your cervix (Pap test). You may be encouraged to have this screening done every 3 years, beginning at age 21.  For women ages 30-65, health care providers may recommend pelvic exams and Pap testing every 3 years, or they may recommend the Pap and pelvic exam, combined with testing for human papilloma virus (HPV), every 5 years. Some types of HPV increase your risk of cervical cancer. Testing for HPV may also be done on women of any age with unclear Pap test results.  Other health care providers may not recommend any screening for nonpregnant women who are considered low risk for pelvic cancer and who do not have symptoms. Ask your health care provider if a screening pelvic exam is right for you.  If you have had past treatment for cervical cancer or a condition that could lead to cancer, you need Pap tests and screening for cancer for at least 20 years after your treatment. If Pap tests have been discontinued, your risk factors (such as having a new sexual partner) need to be reassessed to determine if screening should resume. Some women have medical problems that increase the chance of getting cervical cancer. In these cases, your health care provider may recommend more frequent screening and Pap tests. Colorectal Cancer  This type of cancer can be detected and often prevented.  Routine colorectal cancer screening usually begins at 70 years of age and continues through 70 years of age.  Your health care provider may recommend screening at an earlier age if you have risk factors for colon cancer.  Your health care provider may also recommend using home test kits to check for hidden blood in the stool.  A small camera at the end of a tube can be used to examine your colon directly (sigmoidoscopy or colonoscopy). This is done to check for the earliest forms of colorectal  cancer.  Routine screening usually begins at age 50.  Direct examination of the colon should be repeated every 5-10 years through 70 years of age. However, you may need to be screened more often if early forms of precancerous polyps or small growths are found. Skin Cancer  Check your skin from head to toe regularly.  Tell your health care provider about any new moles or changes in moles, especially if there is a change in a mole's shape or color.  Also tell your health care provider if you have a mole that is larger than the size of a pencil eraser.  Always use sunscreen. Apply sunscreen liberally and repeatedly throughout the day.  Protect yourself by wearing long sleeves, pants, a wide-brimmed hat, and sunglasses whenever you are outside. HEART DISEASE, DIABETES, AND HIGH BLOOD PRESSURE   High blood pressure causes heart disease and increases the risk of stroke. High blood pressure is more likely to develop in:  People who have blood pressure in the high end   of the normal range (130-139/85-89 mm Hg).  People who are overweight or obese.  People who are African American.  If you are 38-23 years of age, have your blood pressure checked every 3-5 years. If you are 61 years of age or older, have your blood pressure checked every year. You should have your blood pressure measured twice--once when you are at a hospital or clinic, and once when you are not at a hospital or clinic. Record the average of the two measurements. To check your blood pressure when you are not at a hospital or clinic, you can use:  An automated blood pressure machine at a pharmacy.  A home blood pressure monitor.  If you are between 45 years and 39 years old, ask your health care provider if you should take aspirin to prevent strokes.  Have regular diabetes screenings. This involves taking a blood sample to check your fasting blood sugar level.  If you are at a normal weight and have a low risk for diabetes,  have this test once every three years after 70 years of age.  If you are overweight and have a high risk for diabetes, consider being tested at a younger age or more often. PREVENTING INFECTION  Hepatitis B  If you have a higher risk for hepatitis B, you should be screened for this virus. You are considered at high risk for hepatitis B if:  You were born in a country where hepatitis B is common. Ask your health care provider which countries are considered high risk.  Your parents were born in a high-risk country, and you have not been immunized against hepatitis B (hepatitis B vaccine).  You have HIV or AIDS.  You use needles to inject street drugs.  You live with someone who has hepatitis B.  You have had sex with someone who has hepatitis B.  You get hemodialysis treatment.  You take certain medicines for conditions, including cancer, organ transplantation, and autoimmune conditions. Hepatitis C  Blood testing is recommended for:  Everyone born from [birth year] through 1966.  Anyone with known risk factors for hepatitis C. Sexually transmitted infections (STIs)  You should be screened for sexually transmitted infections (STIs) including gonorrhea and chlamydia if:  You are sexually active and are younger than 70 years of age.  You are older than 70 years of age and your health care provider tells you that you are at risk for this type of infection.  Your sexual activity has changed since you were last screened and you are at an increased risk for chlamydia or gonorrhea. Ask your health care provider if you are at risk.  If you do not have HIV, but are at risk, it may be recommended that you take a prescription medicine daily to prevent HIV infection. This is called pre-exposure prophylaxis (PrEP). You are considered at risk if:  You are sexually active and do not regularly use condoms or know the HIV status of your partner(s).  You take drugs by injection.  You are sexually  active with a partner who has HIV. Talk with your health care provider about whether you are at high risk of being infected with HIV. If you choose to begin PrEP, you should first be tested for HIV. You should then be tested every 3 months for as long as you are taking PrEP.  PREGNANCY   If you are premenopausal and you may become pregnant, ask your health care provider about preconception counseling.  If you may  become pregnant, take 400 to 800 micrograms (mcg) of folic acid every day.  If you want to prevent pregnancy, talk to your health care provider about birth control (contraception). OSTEOPOROSIS AND MENOPAUSE   Osteoporosis is a disease in which the bones lose minerals and strength with aging. This can result in serious bone fractures. Your risk for osteoporosis can be identified using a bone density scan.  If you are 61 years of age or older, or if you are at risk for osteoporosis and fractures, ask your health care provider if you should be screened.  Ask your health care provider whether you should take a calcium or vitamin D supplement to lower your risk for osteoporosis.  Menopause may have certain physical symptoms and risks.  Hormone replacement therapy may reduce some of these symptoms and risks. Talk to your health care provider about whether hormone replacement therapy is right for you.  HOME CARE INSTRUCTIONS   Schedule regular health, dental, and eye exams.  Stay current with your immunizations.   Do not use any tobacco products including cigarettes, chewing tobacco, or electronic cigarettes.  If you are pregnant, do not drink alcohol.  If you are breastfeeding, limit how much and how often you drink alcohol.  Limit alcohol intake to no more than 1 drink per day for nonpregnant women. One drink equals 12 ounces of beer, 5 ounces of wine, or 1 ounces of hard liquor.  Do not use street drugs.  Do not share needles.  Ask your health care provider for help if  you need support or information about quitting drugs.  Tell your health care provider if you often feel depressed.  Tell your health care provider if you have ever been abused or do not feel safe at home.   This information is not intended to replace advice given to you by your health care provider. Make sure you discuss any questions you have with your health care provider.   Document Released: 03/24/2011 Document Revised: 09/29/2014 Document Reviewed: 08/10/2013 Elsevier Interactive Patient Education [employer].

## 2015-06-29 NOTE — Addendum Note (Signed)
Addended by: Chevis Pretty on: 06/29/2015 10:27 AM   Modules accepted: Orders

## 2015-06-30 LAB — CMP14+EGFR
ALBUMIN: 4.9 g/dL — AB (ref 3.5–4.8)
ALK PHOS: 88 IU/L (ref 39–117)
ALT: 19 IU/L (ref 0–32)
AST: 34 IU/L (ref 0–40)
Albumin/Globulin Ratio: 1.8 (ref 1.1–2.5)
BUN / CREAT RATIO: 18 (ref 11–26)
BUN: 14 mg/dL (ref 8–27)
Bilirubin Total: 0.7 mg/dL (ref 0.0–1.2)
CALCIUM: 10.1 mg/dL (ref 8.7–10.3)
CO2: 27 mmol/L (ref 18–29)
CREATININE: 0.77 mg/dL (ref 0.57–1.00)
Chloride: 93 mmol/L — ABNORMAL LOW (ref 97–108)
GFR calc Af Amer: 90 mL/min/{1.73_m2} (ref >59)
GFR, EST NON AFRICAN AMERICAN: 78 mL/min/{1.73_m2} (ref >59)
GLOBULIN, TOTAL: 2.8 g/dL (ref 1.5–4.5)
GLUCOSE: 107 mg/dL — AB (ref 65–99)
Potassium: 4.5 mmol/L (ref 3.5–5.2)
SODIUM: 136 mmol/L (ref 134–144)
TOTAL PROTEIN: 7.7 g/dL (ref 6.0–8.5)

## 2015-06-30 LAB — LIPID PANEL
CHOL/HDL RATIO: 3.1 ratio (ref 0.0–4.4)
CHOLESTEROL TOTAL: 197 mg/dL (ref 100–199)
HDL: 63 mg/dL (ref >39)
LDL CALC: 106 mg/dL — AB (ref 0–99)
TRIGLYCERIDES: 140 mg/dL (ref 0–149)
VLDL CHOLESTEROL CAL: 28 mg/dL (ref 5–40)

## 2015-06-30 LAB — THYROID PANEL WITH TSH
Free Thyroxine Index: 2.9 (ref 1.2–4.9)
T3 UPTAKE RATIO: 27 % (ref 24–39)
T4 TOTAL: 10.6 ug/dL (ref 4.5–12.0)
TSH: 0.65 u[IU]/mL (ref 0.450–4.500)

## 2015-07-13 ENCOUNTER — Encounter: Payer: Self-pay | Admitting: Pharmacist

## 2015-07-13 ENCOUNTER — Ambulatory Visit (INDEPENDENT_AMBULATORY_CARE_PROVIDER_SITE_OTHER): Payer: Medicare PPO | Admitting: Pharmacist

## 2015-07-13 VITALS — BP 138/74 | HR 76 | Ht 66.0 in | Wt 174.8 lb

## 2015-07-13 DIAGNOSIS — Z0000 Encounter for general adult medical examination without abnormal findings: Secondary | ICD-10-CM

## 2015-07-13 NOTE — Patient Instructions (Signed)
Jasmine White , Thank you for taking time to come for your Medicare Wellness Visit. I appreciate your ongoing commitment to your health goals. Please review the following plan we discussed and let me know if I can assist you in the future.   These are the goals we discussed: Goals    None      This is a list of the screening recommended for you and due dates:  Health Maintenance  Topic Date Due  .  Hepatitis C: One time screening is recommended by Center for Disease Control  (CDC) for  adults born from [birth year] through 1966.   12/28/2015*  . Stool Blood Test  04/16/2016  . Mammogram  04/17/2016  . Flu Shot  04/22/2016  . Pap Smear  12/25/2016  . Tetanus Vaccine  12/02/2022  . Colon Cancer Screening  04/16/2025  . DEXA scan (bone density measurement)  Completed  . Shingles Vaccine  Completed  . Pneumonia vaccines  Completed  *Topic was postponed. The date shown is not the original due date.      Kegel Exercises The goal of Kegel exercises is to isolate and exercise your pelvic floor muscles. These muscles act as a hammock that supports the rectum, vagina, small intestine, and uterus. As the muscles weaken, the hammock sags and these organs are displaced from their normal positions. Kegel exercises can strengthen your pelvic floor muscles and help you to improve bladder and bowel control, improve sexual response, and help reduce many problems and some discomfort during pregnancy. Kegel exercises can be done anywhere and at any time. HOW TO PERFORM [place] your pelvic floor muscles. To do this, squeeze (contract) the muscles that you use when you try to stop the flow of urine. You will feel a tightness in the vaginal area (women) and a tight lift in the rectal area (men and women).  When you begin, contract your pelvic muscles tight for 2-5 seconds, then relax them for 2-5 seconds. This is one set. Do 4-5 sets with a short pause in between.  Contract your pelvic muscles for  8-10 seconds, then relax them for 8-10 seconds. Do 4-5 sets. If you cannot contract your pelvic muscles for 8-10 seconds, try 5-7 seconds and work your way up to 8-10 seconds. Your goal is 4-5 sets of 10 contractions each day. Keep your stomach, buttocks, and legs relaxed during the exercises. Perform sets of both short and long contractions. Vary your positions. Perform these contractions 3-4 times per day. Perform sets while you are:   Lying in bed in the morning.  Standing at lunch.  Sitting in the late afternoon.  Lying in bed at night. You should do 40-50 contractions per day. Do not perform more Kegel exercises per day than recommended. Overexercising can cause muscle fatigue. Continue these exercises for for at least 15-20 weeks or as directed by your caregiver.   This information is not intended to replace advice given to you by your health care provider. Make sure you discuss any questions you have with your health care provider.   Document Released: 08/25/2012 Document Revised: 09/29/2014 Document Reviewed: 08/25/2012 Elsevier Interactive Patient Education 2016 [place] Maintenance, Female Adopting a healthy lifestyle and getting preventive care can go a long way to promote health and wellness. Talk with your health care provider about what schedule of regular examinations is right for you. This is a good chance for you to check in with your provider about disease prevention  and staying healthy. In between checkups, there are plenty of things you can do on your own. Experts have done a lot of research about which lifestyle changes and preventive measures are most likely to keep you healthy. Ask your health care provider for more information. WEIGHT AND DIET  Eat a healthy diet  Be sure to include plenty of vegetables, fruits, low-fat dairy products, and lean protein.  Do not eat a lot of foods high in solid fats, added sugars, or salt.  Get regular exercise. This  is one of the most important things you can do for your health.  Most adults should exercise for at least 150 minutes each week. The exercise should increase your heart rate and make you sweat (moderate-intensity exercise).  Most adults should also do strengthening exercises at least twice a week. This is in addition to the moderate-intensity exercise.  Maintain a healthy weight  Body mass index (BMI) is a measurement that can be used to identify possible weight problems. It estimates body fat based on height and weight. Your health care provider can help determine your BMI and help you achieve or maintain a healthy weight.  For females 43 years of age and older:   A BMI below 18.5 is considered underweight.  A BMI of 18.5 to 24.9 is normal.  A BMI of 25 to 29.9 is considered overweight.  A BMI of 30 and above is considered obese.  Watch levels of cholesterol and blood lipids  You should start having your blood tested for lipids and cholesterol at 70 years of age, then have this test every 5 years.  You may need to have your cholesterol levels checked more often if:  Your lipid or cholesterol levels are high.  You are older than 70 years of age.  You are at high risk for heart disease.  CANCER SCREENING   Lung Cancer  Lung cancer screening is recommended for adults 57-47 years old who are at high risk for lung cancer because of a history of smoking.  A yearly low-dose CT scan of the lungs is recommended for people who:  Currently smoke.  Have quit within the past 15 years.  Have at least a 30-pack-year history of smoking. A pack year is smoking an average of one pack of cigarettes a day for 1 year.  Yearly screening should continue until it has been 15 years since you quit.  Yearly screening should stop if you develop a health problem that would prevent you from having lung cancer treatment.  Breast Cancer  Practice breast self-awareness. This means understanding  how your breasts normally appear and feel.  It also means doing regular breast self-exams. Let your health care provider know about any changes, no matter how small.  If you are in your 20s or 30s, you should have a clinical breast exam (CBE) by a health care provider every 1-3 years as part of a regular health exam.  If you are 7 or older, have a CBE every year. Also consider having a breast X-ray (mammogram) every year.  If you have a family history of breast cancer, talk to your health care provider about genetic screening.  If you are at high risk for breast cancer, talk to your health care provider about having an MRI and a mammogram every year.  Breast cancer gene (BRCA) assessment is recommended for women who have family members with BRCA-related cancers. BRCA-related cancers include:  Breast.  Ovarian.  Tubal.  Peritoneal cancers.  Results of the assessment will determine the need for genetic counseling and BRCA1 and BRCA2 testing. Cervical Cancer Your health care provider may recommend that you be screened regularly for cancer of the pelvic organs (ovaries, uterus, and vagina). This screening involves a pelvic examination, including checking for microscopic changes to the surface of your cervix (Pap test). You may be encouraged to have this screening done every 3 years, beginning at age 94.  For women ages 21-65, health care providers may recommend pelvic exams and Pap testing every 3 years, or they may recommend the Pap and pelvic exam, combined with testing for human papilloma virus (HPV), every 5 years. Some types of HPV increase your risk of cervical cancer. Testing for HPV may also be done on women of any age with unclear Pap test results.  Other health care providers may not recommend any screening for nonpregnant women who are considered low risk for pelvic cancer and who do not have symptoms. Ask your health care provider if a screening pelvic exam is right for  you.  If you have had past treatment for cervical cancer or a condition that could lead to cancer, you need Pap tests and screening for cancer for at least 20 years after your treatment. If Pap tests have been discontinued, your risk factors (such as having a new sexual partner) need to be reassessed to determine if screening should resume. Some women have medical problems that increase the chance of getting cervical cancer. In these cases, your health care provider may recommend more frequent screening and Pap tests. Colorectal Cancer  This type of cancer can be detected and often prevented.  Routine colorectal cancer screening usually begins at 70 years of age and continues through 70 years of age.  Your health care provider may recommend screening at an earlier age if you have risk factors for colon cancer.  Your health care provider may also recommend using home test kits to check for hidden blood in the stool.  A small camera at the end of a tube can be used to examine your colon directly (sigmoidoscopy or colonoscopy). This is done to check for the earliest forms of colorectal cancer.  Routine screening usually begins at age 54.  Direct examination of the colon should be repeated every 5-10 years through 70 years of age. However, you may need to be screened more often if early forms of precancerous polyps or small growths are found. Skin Cancer  Check your skin from head to toe regularly.  Tell your health care provider about any new moles or changes in moles, especially if there is a change in a mole's shape or color.  Also tell your health care provider if you have a mole that is larger than the size of a pencil eraser.  Always use sunscreen. Apply sunscreen liberally and repeatedly throughout the day.  Protect yourself by wearing long sleeves, pants, a wide-brimmed hat, and sunglasses whenever you are outside. HEART DISEASE, DIABETES, AND HIGH BLOOD PRESSURE   High blood  pressure causes heart disease and increases the risk of stroke. High blood pressure is more likely to develop in:  People who have blood pressure in the high end of the normal range (130-139/85-89 mm Hg).  People who are overweight or obese.  People who are African American.  If you are 52-71 years of age, have your blood pressure checked every 3-5 years. If you are 57 years of age or older, have your blood pressure checked every year.  You should have your blood pressure measured twice--once when you are at a hospital or clinic, and once when you are not at a hospital or clinic. Record the average of the two measurements. To check your blood pressure when you are not at a hospital or clinic, you can use:  An automated blood pressure machine at a pharmacy.  A home blood pressure monitor.  If you are between 33 years and 59 years old, ask your health care provider if you should take aspirin to prevent strokes.  Have regular diabetes screenings. This involves taking a blood sample to check your fasting blood sugar level.  If you are at a normal weight and have a low risk for diabetes, have this test once every three years after 70 years of age.  If you are overweight and have a high risk for diabetes, consider being tested at a younger age or more often. PREVENTING INFECTION  Hepatitis B  If you have a higher risk for hepatitis B, you should be screened for this virus. You are considered at high risk for hepatitis B if:  You were born in a country where hepatitis B is common. Ask your health care provider which countries are considered high risk.  Your parents were born in a high-risk country, and you have not been immunized against hepatitis B (hepatitis B vaccine).  You have HIV or AIDS.  You use needles to inject street drugs.  You live with someone who has hepatitis B.  You have had sex with someone who has hepatitis B.  You get hemodialysis treatment.  You take certain  medicines for conditions, including cancer, organ transplantation, and autoimmune conditions. Hepatitis C  Blood testing is recommended for:  Everyone born from [birth year] through 1966.  Anyone with known risk factors for hepatitis C. Sexually transmitted infections (STIs)  You should be screened for sexually transmitted infections (STIs) including gonorrhea and chlamydia if:  You are sexually active and are younger than 70 years of age.  You are older than 70 years of age and your health care provider tells you that you are at risk for this type of infection.  Your sexual activity has changed since you were last screened and you are at an increased risk for chlamydia or gonorrhea. Ask your health care provider if you are at risk.  If you do not have HIV, but are at risk, it may be recommended that you take a prescription medicine daily to prevent HIV infection. This is called pre-exposure prophylaxis (PrEP). You are considered at risk if:  You are sexually active and do not regularly use condoms or know the HIV status of your partner(s).  You take drugs by injection.  You are sexually active with a partner who has HIV. Talk with your health care provider about whether you are at high risk of being infected with HIV. If you choose to begin PrEP, you should first be tested for HIV. You should then be tested every 3 months for as long as you are taking PrEP.  PREGNANCY   If you are premenopausal and you may become pregnant, ask your health care provider about preconception counseling.  If you may become pregnant, take 400 to 800 micrograms (mcg) of folic acid every day.  If you want to prevent pregnancy, talk to your health care provider about birth control (contraception). OSTEOPOROSIS AND MENOPAUSE   Osteoporosis is a disease in which the bones lose minerals and strength with aging. This can result in  serious bone fractures. Your risk for osteoporosis can be identified using a bone  density scan.  If you are 16 years of age or older, or if you are at risk for osteoporosis and fractures, ask your health care provider if you should be screened.  Ask your health care provider whether you should take a calcium or vitamin D supplement to lower your risk for osteoporosis.  Menopause may have certain physical symptoms and risks.  Hormone replacement therapy may reduce some of these symptoms and risks. Talk to your health care provider about whether hormone replacement therapy is right for you.  HOME CARE INSTRUCTIONS   Schedule regular health, dental, and eye exams.  Stay current with your immunizations.   Do not use any tobacco products including cigarettes, chewing tobacco, or electronic cigarettes.  If you are pregnant, do not drink alcohol.  If you are breastfeeding, limit how much and how often you drink alcohol.  Limit alcohol intake to no more than 1 drink per day for nonpregnant women. One drink equals 12 ounces of beer, 5 ounces of wine, or 1 ounces of hard liquor.  Do not use street drugs.  Do not share needles.  Ask your health care provider for help if you need support or information about quitting drugs.  Tell your health care provider if you often feel depressed.  Tell your health care provider if you have ever been abused or do not feel safe at home.   This information is not intended to replace advice given to you by your health care provider. Make sure you discuss any questions you have with your health care provider.   Document Released: 03/24/2011 Document Revised: 09/29/2014 Document Reviewed: 08/10/2013 Elsevier Interactive Patient Education [employer].

## 2015-07-13 NOTE — Progress Notes (Signed)
Subjective:   Jasmine White is a 69 y.o. white, female who presents for an Initial Medicare Annual Wellness Visit.  Jasmine White is married.  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   She remains very active physically and socially and reports no health concerns today.  Current Medications (verified) Outpatient Encounter Prescriptions as of 07/13/2015  Medication Sig  . amitriptyline (ELAVIL) 50 MG tablet Take 1 tablet (50 mg total) by mouth at bedtime.  Marland Kitchen aspirin EC 81 MG tablet Take 81 mg by mouth daily.  . Coenzyme Q10 (CO Q-10) 200 MG CAPS Take 1 capsule by mouth daily.  . Garlic [id number] MG CAPS Take 1 capsule by mouth daily. Take 1000 mg daily  . HYDROcodone-acetaminophen (NORCO/VICODIN) 5-325 MG tablet Take 1 tablet by mouth every 6 (six) hours as needed for moderate pain.  Marland Kitchen losartan-hydrochlorothiazide (HYZAAR) 100-12.5 MG tablet Take 1 tablet by mouth daily.  . Lutein 20 MG CAPS Take 1 capsule by mouth daily.  . metoprolol succinate (TOPROL-XL) 100 MG 24 hr tablet Take 1 tablet (100 mg total) by mouth daily. Take with or immediately following a meal.  . Omega-3 Fatty Acids (FISH OIL) 1200 MG CAPS Take by mouth.  . pravastatin (PRAVACHOL) 40 MG tablet Take 1 tablet (40 mg total) by mouth daily.  . Saline (SIMPLY SALINE) 0.9 % AERS Place 1 spray into the nose daily as needed. For sinuses  . SYNTHROID 88 MCG tablet Take 1 tablet (88 mcg total) by mouth daily before breakfast.   No facility-administered encounter medications on file as of 07/13/2015.    Allergies (verified) Percocet and Ultram   History: Past Medical History  Diagnosis Date  . PONV (postoperative nausea and vomiting)     with knee replacement b/p dropped  . Hypertension     takes Hyzaar and toprol daily  . Hyperlipidemia     takes Pravastatin daily  . Sinusitis     finished zpak yesterday  . Arthritis   . Joint pain   . Joint swelling   . Chronic back  pain     HNP/stenosis and radiculopathy  . Bruises easily   . Urinary frequency   . Nocturia   . Hypothyroidism     takes Synthroid daily  . Insomnia     takes Elevil nightly  . Low BP     past some sedation  . Cancer ([place])     basal skin cancer   Past Surgical History  Procedure Laterality Date  . Back surgery    . Bil knee replacements    . Basal cell skin cancer      on face and forehead  . Breast biopsy      left breast/benign  . Lumbar laminectomy/decompression microdiscectomy  08/13/2012    Procedure: LUMBAR LAMINECTOMY/DECOMPRESSION MICRODISCECTOMY 1 LEVEL;  Surgeon: Erline Levine, MD;  Location: [facility];  Service: Neurosurgery;  Laterality: Left;  Left lumbar one-two Microdiskectomy  . Abdominal hysterectomy     Family History  Problem Relation Age of Onset  . Hypertension Mother   . Heart disease Mother   . Stroke Father   . Heart disease Father   . Alcohol abuse Father   . Arthritis Brother   . Cancer Brother 1    prostate  . Arthritis Brother    Social History   Occupational History  . Not on file.   Social History Main Topics  . Smoking  status: Never Smoker   . Smokeless tobacco: Never Used  . Alcohol Use: No  . Drug Use: No  . Sexual Activity: Not Currently    Do you feel safe at home?  Yes  Dietary issues and exercise activities:  Exercise:  Patient exercises 3 times per week at [facility] for 60 minutes each times - total of 18minutes per week.   Current Dietary habits:  Follows fat limiting diet.  Eats a variety of foods - fruits and vegetables.   Objective:    Today's Vitals   07/13/15 1034  BP: 138/74  Pulse: 76  Height: 5\' 6"  (1.676 m)  Weight: 174 lb 12 oz (79.266 kg)  PainSc: 2   PainLoc: Back   Body mass index is 28.22 kg/(m^2).  Activities of Daily Living In your present state of health, do you have any difficulty performing the following activities: 07/13/2015  Hearing? Y  Vision? N  Difficulty concentrating or making  decisions? N  Walking or climbing stairs? N  Dressing or bathing? N  Doing errands, shopping? N   Patient denies constipation or loose stools.  She has occasional bladder leakage when doing exercise but not daily.  Are there smokers in your home (other than you)? No     Depression Screen PHQ 2/9 Scores 07/13/2015 06/29/2015 12/26/2014 07/06/2014  PHQ - 2 Score 0 0 0 0  PHQ- 9 Score - - - -    Fall Risk Fall Risk  07/13/2015 06/29/2015 07/06/2014 12/30/2013 06/29/2013  Falls in the past year? No No No No No    Cognitive Function: MMSE - Mini Mental State Exam 07/13/2015  Orientation to time 5  Orientation to Place 5  Registration 3  Attention/ Calculation 5  Recall 3  Language- name 2 objects 2  Language- repeat 1  Language- follow 3 step command 3  Language- read & follow direction 1  Write a sentence 1  Copy design 1  Total score 30    Immunizations and Health Maintenance Immunization History  Administered Date(s) Administered  . Influenza,inj,Quad PF,36+ Mos 06/29/2013, 07/06/2014, 06/22/2015  . Pneumococcal Conjugate-13 06/29/2015  . Pneumococcal Polysaccharide-23 12/21/2012  . Tdap 12/19/2010   There are no preventive care reminders to display for this patient.  Patient Care Team: Chevis Pretty, FNP as PCP - General (Nurse Practitioner)  Indicate any recent Medical Services you may have received from other than [facility] providers in the past year (date may be approximate).    Assessment:    Annual Wellness Visit    Screening Tests Health Maintenance  Topic Date Due  . Hepatitis C Screening  12/28/2015 (Originally 07/06/1946)  . COLON CANCER SCREENING ANNUAL FOBT  04/16/2016  . MAMMOGRAM  04/17/2016  . INFLUENZA VACCINE  04/22/2016  . PAP SMEAR  12/25/2016  . TETANUS/TDAP  12/02/2022  . COLONOSCOPY  04/16/2025  . DEXA SCAN  Completed  . ZOSTAVAX  Completed  . PNA vac Low Risk Adult  Completed        Plan:   During the course of the visit  Angenette was educated and counseled about the following appropriate screening and preventive services:   Vaccines UTD  Colorectal cancer screening - UTD  Cardiovascular disease screening - LDL and Tg at goal when last checked.  BP at goal  Diabetes screening - UTD  Bone Denisty / Osteoporosis Screening - UTD  Mammogram - has appt November 2016  PAP - UTD  Glaucoma screening / Eye Exam - UTD  Nutrition counseling- continue  to eat a variety of foods and limit fat intake  Advanced Directives - information given in office today  Discussed [place] exercises and information given to patient  Continue with regular physical activity  Patient Instructions (the written plan) were given to the patient.   Cherre Robins, [facility]   07/13/2015

## 2015-07-26 DIAGNOSIS — G44219 Episodic tension-type headache, not intractable: Secondary | ICD-10-CM | POA: Diagnosis not present

## 2015-09-25 DIAGNOSIS — Z1231 Encounter for screening mammogram for malignant neoplasm of breast: Secondary | ICD-10-CM | POA: Diagnosis not present

## 2015-09-25 LAB — HM MAMMOGRAPHY

## 2015-09-28 ENCOUNTER — Encounter: Payer: Self-pay | Admitting: *Deleted

## 2015-12-28 ENCOUNTER — Ambulatory Visit (INDEPENDENT_AMBULATORY_CARE_PROVIDER_SITE_OTHER): Payer: Medicare PPO | Admitting: Nurse Practitioner

## 2015-12-28 ENCOUNTER — Encounter: Payer: Self-pay | Admitting: Nurse Practitioner

## 2015-12-28 ENCOUNTER — Encounter (INDEPENDENT_AMBULATORY_CARE_PROVIDER_SITE_OTHER): Payer: Self-pay

## 2015-12-28 ENCOUNTER — Encounter: Payer: Self-pay | Admitting: *Deleted

## 2015-12-28 VITALS — BP 141/91 | HR 79 | Temp 97.0°F | Ht 66.0 in | Wt 171.0 lb

## 2015-12-28 DIAGNOSIS — Z0000 Encounter for general adult medical examination without abnormal findings: Secondary | ICD-10-CM | POA: Diagnosis not present

## 2015-12-28 DIAGNOSIS — F32A Depression, unspecified: Secondary | ICD-10-CM

## 2015-12-28 DIAGNOSIS — M549 Dorsalgia, unspecified: Secondary | ICD-10-CM

## 2015-12-28 DIAGNOSIS — G4700 Insomnia, unspecified: Secondary | ICD-10-CM

## 2015-12-28 DIAGNOSIS — Z01419 Encounter for gynecological examination (general) (routine) without abnormal findings: Secondary | ICD-10-CM | POA: Diagnosis not present

## 2015-12-28 DIAGNOSIS — E034 Atrophy of thyroid (acquired): Secondary | ICD-10-CM

## 2015-12-28 DIAGNOSIS — G8929 Other chronic pain: Secondary | ICD-10-CM

## 2015-12-28 DIAGNOSIS — Z6827 Body mass index (BMI) 27.0-27.9, adult: Secondary | ICD-10-CM

## 2015-12-28 DIAGNOSIS — Z1159 Encounter for screening for other viral diseases: Secondary | ICD-10-CM

## 2015-12-28 DIAGNOSIS — I10 Essential (primary) hypertension: Secondary | ICD-10-CM | POA: Diagnosis not present

## 2015-12-28 DIAGNOSIS — F329 Major depressive disorder, single episode, unspecified: Secondary | ICD-10-CM | POA: Diagnosis not present

## 2015-12-28 DIAGNOSIS — E038 Other specified hypothyroidism: Secondary | ICD-10-CM | POA: Diagnosis not present

## 2015-12-28 DIAGNOSIS — E785 Hyperlipidemia, unspecified: Secondary | ICD-10-CM | POA: Diagnosis not present

## 2015-12-28 LAB — URINALYSIS, COMPLETE
Bilirubin, UA: NEGATIVE
Glucose, UA: NEGATIVE
KETONES UA: NEGATIVE
Nitrite, UA: NEGATIVE
Protein, UA: NEGATIVE
SPEC GRAV UA: 1.015 (ref 1.005–1.030)
Urobilinogen, Ur: 1 mg/dL (ref 0.2–1.0)
pH, UA: 7 (ref 5.0–7.5)

## 2015-12-28 LAB — MICROSCOPIC EXAMINATION: Epithelial Cells (non renal): >10 /hpf — AB (ref 0–10)

## 2015-12-28 MED ORDER — SYNTHROID 88 MCG PO TABS: 88.0000 ug | ORAL_TABLET | Freq: Every day | ORAL | Status: DC

## 2015-12-28 MED ORDER — HYDROCODONE-ACETAMINOPHEN 5-325 MG PO TABS: 1.0000 | ORAL_TABLET | Freq: Four times a day (QID) | ORAL | Status: DC | PRN

## 2015-12-28 MED ORDER — METOPROLOL SUCCINATE ER 100 MG PO TB24: 100.0000 mg | ORAL_TABLET | Freq: Every day | ORAL | Status: DC

## 2015-12-28 MED ORDER — PRAVASTATIN SODIUM 40 MG PO TABS: 40.0000 mg | ORAL_TABLET | Freq: Every day | ORAL | Status: DC

## 2015-12-28 MED ORDER — LOSARTAN POTASSIUM-HCTZ 100-12.5 MG PO TABS: 1.0000 | ORAL_TABLET | Freq: Every day | ORAL | Status: DC

## 2015-12-28 MED ORDER — AMITRIPTYLINE HCL 50 MG PO TABS: 50.0000 mg | ORAL_TABLET | Freq: Every day | ORAL | Status: DC

## 2015-12-28 NOTE — Progress Notes (Signed)
Subjective:    Patient ID: Jasmine White, female    DOB: 25-Aug-1946, 70 y.o.   MRN: [id number]  Patient here today for PAP and  follow up of chronic medical problems.doing well today without complaints- no changes since last visit.  Outpatient Encounter Prescriptions as of 12/28/2015  Medication Sig  . amitriptyline (ELAVIL) 50 MG tablet Take 1 tablet (50 mg total) by mouth at bedtime.  Marland Kitchen aspirin EC 81 MG tablet Take 81 mg by mouth daily.  . Coenzyme Q10 (CO Q-10) 200 MG CAPS Take 1 capsule by mouth daily.  . Garlic 3009 MG CAPS Take 1 capsule by mouth daily. Take 1000 mg daily  . HYDROcodone-acetaminophen (NORCO/VICODIN) 5-325 MG tablet Take 1 tablet by mouth every 6 (six) hours as needed for moderate pain.  Marland Kitchen losartan-hydrochlorothiazide (HYZAAR) 100-12.5 MG tablet Take 1 tablet by mouth daily.  . Lutein 20 MG CAPS Take 1 capsule by mouth daily.  . metoprolol succinate (TOPROL-XL) 100 MG 24 hr tablet Take 1 tablet (100 mg total) by mouth daily. Take with or immediately following a meal.  . Omega-3 Fatty Acids (FISH OIL) 1200 MG CAPS Take by mouth.  . pravastatin (PRAVACHOL) 40 MG tablet Take 1 tablet (40 mg total) by mouth daily.  . Saline (SIMPLY SALINE) 0.9 % AERS Place 1 spray into the nose daily as needed. For sinuses  . SYNTHROID 88 MCG tablet Take 1 tablet (88 mcg total) by mouth daily before breakfast.   No facility-administered encounter medications on file as of 12/28/2015.     Hypertension This is a chronic problem. The current episode started more than 1 year ago. The problem is controlled. Pertinent negatives include no anxiety, chest pain, palpitations or shortness of breath. There are no associated agents to hypertension. Risk factors for coronary artery disease include dyslipidemia and post-menopausal state. Past treatments include diuretics and beta blockers. The current treatment provides moderate improvement. There are no compliance problems.   Hyperlipidemia This is a  chronic problem. The current episode started more than 1 year ago. The problem is controlled. There are no known factors aggravating her hyperlipidemia. Pertinent negatives include no chest pain, myalgias or shortness of breath. Current antihyperlipidemic treatment includes statins. The current treatment provides no improvement of lipids. There are no compliance problems.  Risk factors for coronary artery disease include dyslipidemia, hypertension and post-menopausal.  Hypothyroidism This is a chronic problem. Doing well. No complaint of fatigue Depression This is a chronic problem. Doing well with current meds. No Suicidal thoughts. Chronic back pain  Has had 2 back surgeries by dr. Vertell Limber- she takes hydrocodone on occasion when pain is bad. She is about to run out of hydrocodone.  Review of Systems  Respiratory: Negative for shortness of breath.   Cardiovascular: Negative for chest pain and palpitations.  Musculoskeletal: Negative for myalgias.       Objective:   Physical Exam  Constitutional: She is oriented to person, place, and time. She appears well-developed and well-nourished.  HENT:  Head: Normocephalic.  Right Ear: Hearing, tympanic membrane, external ear and ear canal normal.  Left Ear: Hearing, tympanic membrane, external ear and ear canal normal.  Nose: Nose normal.  Mouth/Throat: Uvula is midline and oropharynx is clear and moist.  Eyes: Conjunctivae and EOM are normal. Pupils are equal, White, and reactive to light.  Neck: Trachea normal, normal range of motion and full passive range of motion without pain. Neck supple. No JVD present. Carotid bruit is not present. No thyroid mass  and no thyromegaly present.  Cardiovascular: Normal rate, regular rhythm, normal heart sounds and intact distal pulses.  Exam reveals no gallop and no friction rub.   No murmur heard. Pulmonary/Chest: Effort normal and breath sounds normal. Right breast exhibits no inverted nipple, no mass, no  nipple discharge, no skin change and no tenderness. Left breast exhibits no inverted nipple, no mass, no nipple discharge, no skin change and no tenderness.  Abdominal: Soft. Bowel sounds are normal. She exhibits no distension and no mass. There is no tenderness.  Genitourinary: No breast swelling, tenderness, discharge or bleeding.  Musculoskeletal: Normal range of motion.  Lymphadenopathy:    She has no cervical adenopathy.  Neurological: She is alert and oriented to person, place, and time. She has normal reflexes.  Skin: Skin is warm and dry.  Psychiatric: She has a normal mood and affect. Her behavior is normal. Judgment and thought content normal.    BP 141/91 mmHg  Pulse 79  Temp(Src) 97 F (36.1 C) (Oral)  Ht '5\' 6"'  (1.676 m)  Wt 171 lb (77.565 kg)  BMI 27.61 kg/m2        Assessment & Plan:  1. Annual physical exam - Urinalysis, Complete - Pap IG (Image Guided)  2. Essential hypertension, benign Do not add salt to diet - CMP14+EGFR - losartan-hydrochlorothiazide (HYZAAR) 100-12.5 MG tablet; Take 1 tablet by mouth daily.  Dispense: 90 tablet; Refill: 1 - metoprolol succinate (TOPROL-XL) 100 MG 24 hr tablet; Take 1 tablet (100 mg total) by mouth daily. Take with or immediately following a meal.  Dispense: 90 tablet; Refill: 1  3. Hypothyroidism due to acquired atrophy of thyroid - Thyroid Panel With TSH - SYNTHROID 88 MCG tablet; Take 1 tablet (88 mcg total) by mouth daily before breakfast.  Dispense: 90 tablet; Refill: 1  4. Depression Stress management  5. Hyperlipidemia with target LDL less than 130 Low fat diet - Lipid panel - pravastatin (PRAVACHOL) 40 MG tablet; Take 1 tablet (40 mg total) by mouth daily.  Dispense: 90 tablet; Refill: 1  6. Chronic back pain Back exercises  7. BMI 27.0-27.9,adult Discussed diet and exercise for person with BMI >25 Will recheck weight in 3-6 months  8. Need for hepatitis C screening test - Hepatitis C antibody  9.  Insomnia Bedtime ritual - amitriptyline (ELAVIL) 50 MG tablet; Take 1 tablet (50 mg total) by mouth at bedtime.  Dispense: 90 tablet; Refill: 1    Labs pending Health maintenance reviewed Diet and exercise encouraged Continue all meds Follow up  In 6 months   [place], FNP

## 2015-12-28 NOTE — Patient Instructions (Signed)

## 2015-12-28 NOTE — Addendum Note (Signed)
Addended by: Chevis Pretty on: 12/28/2015 09:44 AM   Modules accepted: Orders, SmartSet

## 2015-12-29 LAB — CMP14+EGFR
A/G RATIO: 1.9 (ref 1.2–2.2)
ALBUMIN: 4.8 g/dL (ref 3.5–4.8)
ALT: 18 IU/L (ref 0–32)
AST: 36 IU/L (ref 0–40)
Alkaline Phosphatase: 91 IU/L (ref 39–117)
BUN / CREAT RATIO: 21 (ref 12–28)
BUN: 16 mg/dL (ref 8–27)
Bilirubin Total: 0.5 mg/dL (ref 0.0–1.2)
CALCIUM: 10 mg/dL (ref 8.7–10.3)
CO2: 25 mmol/L (ref 18–29)
Chloride: 94 mmol/L — ABNORMAL LOW (ref 96–106)
Creatinine, Ser: 0.78 mg/dL (ref 0.57–1.00)
GFR, EST AFRICAN AMERICAN: 89 mL/min/{1.73_m2} (ref >59)
GFR, EST NON AFRICAN AMERICAN: 77 mL/min/{1.73_m2} (ref >59)
GLOBULIN, TOTAL: 2.5 g/dL (ref 1.5–4.5)
Glucose: 103 mg/dL — ABNORMAL HIGH (ref 65–99)
POTASSIUM: 4.2 mmol/L (ref 3.5–5.2)
SODIUM: 138 mmol/L (ref 134–144)
Total Protein: 7.3 g/dL (ref 6.0–8.5)

## 2015-12-29 LAB — LIPID PANEL
CHOL/HDL RATIO: 3.1 ratio (ref 0.0–4.4)
Cholesterol, Total: 205 mg/dL — ABNORMAL HIGH (ref 100–199)
HDL: 67 mg/dL (ref >39)
LDL CALC: 106 mg/dL — AB (ref 0–99)
TRIGLYCERIDES: 158 mg/dL — AB (ref 0–149)
VLDL Cholesterol Cal: 32 mg/dL (ref 5–40)

## 2015-12-29 LAB — THYROID PANEL WITH TSH
Free Thyroxine Index: 3.1 (ref 1.2–4.9)
T3 UPTAKE RATIO: 28 % (ref 24–39)
T4 TOTAL: 11.1 ug/dL (ref 4.5–12.0)
TSH: 0.836 u[IU]/mL (ref 0.450–4.500)

## 2015-12-29 LAB — HEPATITIS C ANTIBODY: Hep C Virus Ab: <0.1 s/co ratio (ref 0.0–0.9)

## 2016-01-02 LAB — PAP IG (IMAGE GUIDED): PAP SMEAR COMMENT: 0

## 2016-04-30 DIAGNOSIS — D225 Melanocytic nevi of trunk: Secondary | ICD-10-CM | POA: Diagnosis not present

## 2016-04-30 DIAGNOSIS — L723 Sebaceous cyst: Secondary | ICD-10-CM | POA: Diagnosis not present

## 2016-04-30 DIAGNOSIS — D2239 Melanocytic nevi of other parts of face: Secondary | ICD-10-CM | POA: Diagnosis not present

## 2016-04-30 DIAGNOSIS — D485 Neoplasm of uncertain behavior of skin: Secondary | ICD-10-CM | POA: Diagnosis not present

## 2016-04-30 DIAGNOSIS — Z85828 Personal history of other malignant neoplasm of skin: Secondary | ICD-10-CM | POA: Diagnosis not present

## 2016-04-30 DIAGNOSIS — C44719 Basal cell carcinoma of skin of left lower limb, including hip: Secondary | ICD-10-CM | POA: Diagnosis not present

## 2016-04-30 DIAGNOSIS — L565 Disseminated superficial actinic porokeratosis (DSAP): Secondary | ICD-10-CM | POA: Diagnosis not present

## 2016-04-30 DIAGNOSIS — L570 Actinic keratosis: Secondary | ICD-10-CM | POA: Diagnosis not present

## 2016-07-11 ENCOUNTER — Ambulatory Visit (INDEPENDENT_AMBULATORY_CARE_PROVIDER_SITE_OTHER): Payer: Medicare PPO | Admitting: Nurse Practitioner

## 2016-07-11 ENCOUNTER — Encounter: Payer: Self-pay | Admitting: Nurse Practitioner

## 2016-07-11 VITALS — BP 132/81 | HR 77 | Temp 97.0°F | Ht 66.0 in | Wt 171.0 lb

## 2016-07-11 DIAGNOSIS — E785 Hyperlipidemia, unspecified: Secondary | ICD-10-CM

## 2016-07-11 DIAGNOSIS — M545 Low back pain, unspecified: Secondary | ICD-10-CM

## 2016-07-11 DIAGNOSIS — E034 Atrophy of thyroid (acquired): Secondary | ICD-10-CM | POA: Diagnosis not present

## 2016-07-11 DIAGNOSIS — F3342 Major depressive disorder, recurrent, in full remission: Secondary | ICD-10-CM | POA: Diagnosis not present

## 2016-07-11 DIAGNOSIS — G8929 Other chronic pain: Secondary | ICD-10-CM | POA: Diagnosis not present

## 2016-07-11 DIAGNOSIS — Z6827 Body mass index (BMI) 27.0-27.9, adult: Secondary | ICD-10-CM | POA: Diagnosis not present

## 2016-07-11 DIAGNOSIS — F5101 Primary insomnia: Secondary | ICD-10-CM | POA: Diagnosis not present

## 2016-07-11 DIAGNOSIS — I10 Essential (primary) hypertension: Secondary | ICD-10-CM | POA: Diagnosis not present

## 2016-07-11 DIAGNOSIS — Z1212 Encounter for screening for malignant neoplasm of rectum: Secondary | ICD-10-CM | POA: Diagnosis not present

## 2016-07-11 MED ORDER — SYNTHROID 88 MCG PO TABS: 88.0000 ug | ORAL_TABLET | Freq: Every day | ORAL | 1 refills | Status: DC

## 2016-07-11 MED ORDER — HYDROCODONE-ACETAMINOPHEN 5-325 MG PO TABS: 1.0000 | ORAL_TABLET | Freq: Four times a day (QID) | ORAL | 0 refills | Status: DC | PRN

## 2016-07-11 MED ORDER — PRAVASTATIN SODIUM 40 MG PO TABS: 40.0000 mg | ORAL_TABLET | Freq: Every day | ORAL | 1 refills | Status: DC

## 2016-07-11 MED ORDER — METOPROLOL SUCCINATE ER 100 MG PO TB24: 100.0000 mg | ORAL_TABLET | Freq: Every day | ORAL | 1 refills | Status: DC

## 2016-07-11 MED ORDER — AMITRIPTYLINE HCL 50 MG PO TABS: 50.0000 mg | ORAL_TABLET | Freq: Every day | ORAL | 1 refills | Status: DC

## 2016-07-11 MED ORDER — LOSARTAN POTASSIUM-HCTZ 100-12.5 MG PO TABS: 1.0000 | ORAL_TABLET | Freq: Every day | ORAL | 1 refills | Status: DC

## 2016-07-11 NOTE — Patient Instructions (Signed)
Stress and Stress Management Stress is a normal reaction to life events. It is what you feel when life demands more than you are used to or more than you can handle. Some stress can be useful. For example, the stress reaction can help you catch the last bus of the day, study for a test, or meet a deadline at work. But stress that occurs too often or for too long can cause problems. It can affect your emotional health and interfere with relationships and normal daily activities. Too much stress can weaken your immune system and increase your risk for physical illness. If you already have a medical problem, stress can make it worse. CAUSES  All sorts of life events may cause stress. An event that causes stress for one person may not be stressful for another person. Major life events commonly cause stress. These may be positive or negative. Examples include losing your job, moving into a new home, getting married, having a baby, or losing a loved one. Less obvious life events may also cause stress, especially if they occur day after day or in combination. Examples include working long hours, driving in traffic, caring for children, being in debt, or being in a difficult relationship. SIGNS AND SYMPTOMS Stress may cause emotional symptoms including, the following:  Anxiety. This is feeling worried, afraid, on edge, overwhelmed, or out of control.  Anger. This is feeling irritated or impatient.  Depression. This is feeling sad, down, helpless, or guilty.  Difficulty focusing, remembering, or making decisions. Stress may cause physical symptoms, including the following:   Aches and pains. These may affect your head, neck, back, stomach, or other areas of your body.  Tight muscles or clenched jaw.  Low energy or trouble sleeping. Stress may cause unhealthy behaviors, including the following:   Eating to feel better (overeating) or skipping meals.  Sleeping too little, too much, or both.  Working  too much or putting off tasks (procrastination).  Smoking, drinking alcohol, or using drugs to feel better. DIAGNOSIS  Stress is diagnosed through an assessment by your health care provider. Your health care provider will ask questions about your symptoms and any stressful life events.Your health care provider will also ask about your medical history and may order blood tests or other tests. Certain medical conditions and medicine can cause physical symptoms similar to stress. Mental illness can cause emotional symptoms and unhealthy behaviors similar to stress. Your health care provider may refer you to a mental health professional for further evaluation.  TREATMENT  Stress management is the recommended treatment for stress.The goals of stress management are reducing stressful life events and coping with stress in healthy ways.  Techniques for reducing stressful life events include the following:  Stress identification. Self-monitor for stress and identify what causes stress for you. These skills may help you to avoid some stressful events.  Time management. Set your priorities, keep a calendar of events, and learn to say "no." These tools can help you avoid making too many commitments. Techniques for coping with stress include the following:  Rethinking the problem. Try to think realistically about stressful events rather than ignoring them or overreacting. Try to find the positives in a stressful situation rather than focusing on the negatives.  Exercise. Physical exercise can release both physical and emotional tension. The key is to find a form of exercise you enjoy and do it regularly.  Relaxation techniques. These relax the body and mind. Examples include yoga, meditation, tai chi, biofeedback, deep  breathing, progressive muscle relaxation, listening to music, being out in nature, journaling, and other hobbies. Again, the key is to find one or more that you enjoy and can do  regularly.  Healthy lifestyle. Eat a balanced diet, get plenty of sleep, and do not smoke. Avoid using alcohol or drugs to relax.  Strong support network. Spend time with family, friends, or other people you enjoy being around.Express your feelings and talk things over with someone you trust. Counseling or talktherapy with a mental health professional may be helpful if you are having difficulty managing stress on your own. Medicine is typically not recommended for the treatment of stress.Talk to your health care provider if you think you need medicine for symptoms of stress. HOME CARE INSTRUCTIONS  Keep all follow-up visits as directed by your health care provider.  Take all medicines as directed by your health care provider. SEEK MEDICAL CARE IF:  Your symptoms get worse or you start having new symptoms.  You feel overwhelmed by your problems and can no longer manage them on your own. SEEK IMMEDIATE MEDICAL CARE IF:  You feel like hurting yourself or someone else.   This information is not intended to replace advice given to you by your health care provider. Make sure you discuss any questions you have with your health care provider.   Document Released: 03/04/2001 Document Revised: 09/29/2014 Document Reviewed: 05/03/2013 Elsevier Interactive Patient Education 2016 [employer]  

## 2016-07-11 NOTE — Progress Notes (Signed)
Subjective:    Patient ID: Jasmine White, female    DOB: Aug 10, 1946, 70 y.o.   MRN: [id number]  Patient here today for follow up of chronic medical problems. No chnages since last visit. No complaints today.  Outpatient Encounter Prescriptions as of 07/11/2016  Medication Sig  . amitriptyline (ELAVIL) 50 MG tablet Take 1 tablet (50 mg total) by mouth at bedtime.  Marland Kitchen aspirin EC 81 MG tablet Take 81 mg by mouth daily.  . Coenzyme Q10 (CO Q-10) 200 MG CAPS Take 1 capsule by mouth daily.  . Garlic 9179 MG CAPS Take 1 capsule by mouth daily. Take 1000 mg daily  . HYDROcodone-acetaminophen (NORCO/VICODIN) 5-325 MG tablet Take 1 tablet by mouth every 6 (six) hours as needed for moderate pain.  Marland Kitchen losartan-hydrochlorothiazide (HYZAAR) 100-12.5 MG tablet Take 1 tablet by mouth daily.  . Lutein 20 MG CAPS Take 1 capsule by mouth daily.  . metoprolol succinate (TOPROL-XL) 100 MG 24 hr tablet Take 1 tablet (100 mg total) by mouth daily. Take with or immediately following a meal.  . Omega-3 Fatty Acids (FISH OIL) 1200 MG CAPS Take by mouth.  . pravastatin (PRAVACHOL) 40 MG tablet Take 1 tablet (40 mg total) by mouth daily.  . Saline (SIMPLY SALINE) 0.9 % AERS Place 1 spray into the nose daily as needed. For sinuses  . SYNTHROID 88 MCG tablet Take 1 tablet (88 mcg total) by mouth daily before breakfast.   No facility-administered encounter medications on file as of 07/11/2016.     Hypertension  This is a chronic problem. The current episode started more than 1 year ago. The problem is controlled. Pertinent negatives include no anxiety, chest pain, palpitations or shortness of breath. There are no associated agents to hypertension. Risk factors for coronary artery disease include dyslipidemia and post-menopausal state. Past treatments include diuretics and beta blockers. The current treatment provides moderate improvement. There are no compliance problems.   Hyperlipidemia  This is a chronic problem. The  current episode started more than 1 year ago. The problem is controlled. There are no known factors aggravating her hyperlipidemia. Pertinent negatives include no chest pain, myalgias or shortness of breath. Current antihyperlipidemic treatment includes statins. The current treatment provides no improvement of lipids. There are no compliance problems.  Risk factors for coronary artery disease include dyslipidemia, hypertension and post-menopausal.  Hypothyroidism This is a chronic problem. Doing well. No complaint of fatigue Depression This is a chronic problem. Doing well with current meds. No Suicidal thoughts. Chronic back pain  Has had 2 back surgeries by dr. Vertell Limber- she takes hydrocodone on occasion when pain is bad. She is out of hydrocodone.  Review of Systems  Respiratory: Negative for shortness of breath.   Cardiovascular: Negative for chest pain and palpitations.  Musculoskeletal: Negative for myalgias.       Objective:   Physical Exam  Constitutional: She is oriented to person, place, and time. She appears well-developed and well-nourished.  HENT:  Head: Normocephalic.  Right Ear: Hearing, tympanic membrane, external ear and ear canal normal.  Left Ear: Hearing, tympanic membrane, external ear and ear canal normal.  Nose: Nose normal.  Mouth/Throat: Uvula is midline and oropharynx is clear and moist.  Eyes: Conjunctivae and EOM are normal. Pupils are equal, White, and reactive to light.  Neck: Trachea normal, normal range of motion and full passive range of motion without pain. Neck supple. No JVD present. Carotid bruit is not present. No thyroid mass and no thyromegaly present.  Cardiovascular: Normal rate, regular rhythm, normal heart sounds and intact distal pulses.  Exam reveals no gallop and no friction rub.   No murmur heard. Pulmonary/Chest: Effort normal and breath sounds normal. Right breast exhibits no inverted nipple, no mass, no nipple discharge, no skin change and  no tenderness. Left breast exhibits no inverted nipple, no mass, no nipple discharge, no skin change and no tenderness.  Abdominal: Soft. Bowel sounds are normal. She exhibits no distension and no mass. There is no tenderness.  Genitourinary: No breast swelling, tenderness, discharge or bleeding.  Musculoskeletal: Normal range of motion.  Lymphadenopathy:    She has no cervical adenopathy.  Neurological: She is alert and oriented to person, place, and time. She has normal reflexes.  Skin: Skin is warm and dry.  Psychiatric: She has a normal mood and affect. Her behavior is normal. Judgment and thought content normal.    BP 132/81   Pulse 77   Temp 97 F (36.1 C) (Oral)   Ht '5\' 6"'  (1.676 m)   Wt 171 lb (77.6 kg)   BMI 27.60 kg/m         Assessment & Plan:  1. Essential hypertension, benign Do not add salt to diet - metoprolol succinate (TOPROL-XL) 100 MG 24 hr tablet; Take 1 tablet (100 mg total) by mouth daily. Take with or immediately following a meal.  Dispense: 90 tablet; Refill: 1 - losartan-hydrochlorothiazide (HYZAAR) 100-12.5 MG tablet; Take 1 tablet by mouth daily.  Dispense: 90 tablet; Refill: 1 - CMP14+EGFR  2. Hypothyroidism due to acquired atrophy of thyroid - SYNTHROID 88 MCG tablet; Take 1 tablet (88 mcg total) by mouth daily before breakfast.  Dispense: 90 tablet; Refill: 1 - Thyroid Panel With TSH  3. BMI 27.0-27.9,adult Discussed diet and exercise for person with BMI >25 Will recheck weight in 3-6 months   4. Chronic midline low back pain without sciatica Only takes pain meds rarely - HYDROcodone-acetaminophen (NORCO/VICODIN) 5-325 MG tablet; Take 1 tablet by mouth every 6 (six) hours as needed for moderate pain.  Dispense: 40 tablet; Refill: 0  5. Recurrent major depressive disorder, in full remission [facility]) Stress management  6. Hyperlipidemia with target LDL less than 130 Low fat diet - pravastatin (PRAVACHOL) 40 MG tablet; Take 1 tablet (40 mg  total) by mouth daily.  Dispense: 90 tablet; Refill: 1 - Lipid panel  7. Primary insomnia Bedtime routine - amitriptyline (ELAVIL) 50 MG tablet; Take 1 tablet (50 mg total) by mouth at bedtime.  Dispense: 90 tablet; Refill: 1  8. Screening for malignant neoplasm of the rectum - Fecal occult blood, imunochemical; Future    Labs pending Health maintenance reviewed Diet and exercise encouraged Continue all meds Follow up  In 6 months   [place], FNP

## 2016-07-12 LAB — CMP14+EGFR
A/G RATIO: 1.8 (ref 1.2–2.2)
ALT: 16 IU/L (ref 0–32)
AST: 37 IU/L (ref 0–40)
Albumin: 4.9 g/dL — ABNORMAL HIGH (ref 3.5–4.8)
Alkaline Phosphatase: 80 IU/L (ref 39–117)
BILIRUBIN TOTAL: 0.6 mg/dL (ref 0.0–1.2)
BUN/Creatinine Ratio: 28 (ref 12–28)
BUN: 22 mg/dL (ref 8–27)
CALCIUM: 10.3 mg/dL (ref 8.7–10.3)
CHLORIDE: 91 mmol/L — AB (ref 96–106)
CO2: 28 mmol/L (ref 18–29)
Creatinine, Ser: 0.79 mg/dL (ref 0.57–1.00)
GFR, EST AFRICAN AMERICAN: 87 mL/min/{1.73_m2} (ref >59)
GFR, EST NON AFRICAN AMERICAN: 76 mL/min/{1.73_m2} (ref >59)
GLOBULIN, TOTAL: 2.7 g/dL (ref 1.5–4.5)
Glucose: 104 mg/dL — ABNORMAL HIGH (ref 65–99)
POTASSIUM: 4.1 mmol/L (ref 3.5–5.2)
SODIUM: 137 mmol/L (ref 134–144)
TOTAL PROTEIN: 7.6 g/dL (ref 6.0–8.5)

## 2016-07-12 LAB — LIPID PANEL
CHOL/HDL RATIO: 3.2 ratio (ref 0.0–4.4)
Cholesterol, Total: 194 mg/dL (ref 100–199)
HDL: 61 mg/dL (ref >39)
LDL Calculated: 105 mg/dL — ABNORMAL HIGH (ref 0–99)
Triglycerides: 138 mg/dL (ref 0–149)
VLDL Cholesterol Cal: 28 mg/dL (ref 5–40)

## 2016-07-12 LAB — THYROID PANEL WITH TSH
FREE THYROXINE INDEX: 3.2 (ref 1.2–4.9)
T3 UPTAKE RATIO: 29 % (ref 24–39)
T4, Total: 11.2 ug/dL (ref 4.5–12.0)
TSH: 1.36 u[IU]/mL (ref 0.450–4.500)

## 2016-07-24 DIAGNOSIS — G44219 Episodic tension-type headache, not intractable: Secondary | ICD-10-CM | POA: Diagnosis not present

## 2016-11-18 ENCOUNTER — Telehealth: Payer: Self-pay | Admitting: Nurse Practitioner

## 2016-11-21 ENCOUNTER — Other Ambulatory Visit: Payer: Self-pay | Admitting: *Deleted

## 2016-11-21 DIAGNOSIS — F5101 Primary insomnia: Secondary | ICD-10-CM

## 2016-11-21 DIAGNOSIS — E034 Atrophy of thyroid (acquired): Secondary | ICD-10-CM

## 2016-11-21 MED ORDER — AMITRIPTYLINE HCL 50 MG PO TABS: 50.0000 mg | ORAL_TABLET | Freq: Every day | ORAL | 0 refills | Status: DC

## 2016-11-21 MED ORDER — SYNTHROID 88 MCG PO TABS: 88.0000 ug | ORAL_TABLET | Freq: Every day | ORAL | 1 refills | Status: DC

## 2016-12-01 ENCOUNTER — Encounter: Payer: Medicare PPO | Admitting: *Deleted

## 2016-12-11 DIAGNOSIS — Z1231 Encounter for screening mammogram for malignant neoplasm of breast: Secondary | ICD-10-CM | POA: Diagnosis not present

## 2017-01-09 ENCOUNTER — Encounter: Payer: Self-pay | Admitting: Nurse Practitioner

## 2017-01-09 ENCOUNTER — Other Ambulatory Visit: Payer: Self-pay | Admitting: Nurse Practitioner

## 2017-01-09 ENCOUNTER — Ambulatory Visit (INDEPENDENT_AMBULATORY_CARE_PROVIDER_SITE_OTHER): Payer: Medicare PPO | Admitting: Nurse Practitioner

## 2017-01-09 VITALS — BP 148/84 | HR 64 | Temp 96.9°F | Ht 66.0 in | Wt 166.0 lb

## 2017-01-09 DIAGNOSIS — F5101 Primary insomnia: Secondary | ICD-10-CM

## 2017-01-09 DIAGNOSIS — Z0000 Encounter for general adult medical examination without abnormal findings: Secondary | ICD-10-CM | POA: Diagnosis not present

## 2017-01-09 DIAGNOSIS — M545 Low back pain, unspecified: Secondary | ICD-10-CM

## 2017-01-09 DIAGNOSIS — I10 Essential (primary) hypertension: Secondary | ICD-10-CM

## 2017-01-09 DIAGNOSIS — Z01419 Encounter for gynecological examination (general) (routine) without abnormal findings: Secondary | ICD-10-CM

## 2017-01-09 DIAGNOSIS — E785 Hyperlipidemia, unspecified: Secondary | ICD-10-CM | POA: Diagnosis not present

## 2017-01-09 DIAGNOSIS — E034 Atrophy of thyroid (acquired): Secondary | ICD-10-CM

## 2017-01-09 DIAGNOSIS — Z6827 Body mass index (BMI) 27.0-27.9, adult: Secondary | ICD-10-CM

## 2017-01-09 DIAGNOSIS — F3342 Major depressive disorder, recurrent, in full remission: Secondary | ICD-10-CM

## 2017-01-09 DIAGNOSIS — G8929 Other chronic pain: Secondary | ICD-10-CM

## 2017-01-09 LAB — MICROSCOPIC EXAMINATION

## 2017-01-09 LAB — URINALYSIS, COMPLETE
BILIRUBIN UA: NEGATIVE
Glucose, UA: NEGATIVE
KETONES UA: NEGATIVE
Nitrite, UA: NEGATIVE
PH UA: 7.5 (ref 5.0–7.5)
Protein, UA: NEGATIVE
RBC UA: NEGATIVE
SPEC GRAV UA: 1.01 (ref 1.005–1.030)
UUROB: 0.2 mg/dL (ref 0.2–1.0)

## 2017-01-09 MED ORDER — PRAVASTATIN SODIUM 40 MG PO TABS: 40.0000 mg | ORAL_TABLET | Freq: Every day | ORAL | 1 refills | Status: DC

## 2017-01-09 MED ORDER — METOPROLOL SUCCINATE ER 100 MG PO TB24: 100.0000 mg | ORAL_TABLET | Freq: Every day | ORAL | 1 refills | Status: DC

## 2017-01-09 MED ORDER — LOSARTAN POTASSIUM-HCTZ 100-12.5 MG PO TABS: 1.0000 | ORAL_TABLET | Freq: Every day | ORAL | 1 refills | Status: DC

## 2017-01-09 MED ORDER — AMITRIPTYLINE HCL 50 MG PO TABS: 50.0000 mg | ORAL_TABLET | Freq: Every day | ORAL | 0 refills | Status: DC

## 2017-01-09 MED ORDER — HYDROCODONE-ACETAMINOPHEN 5-325 MG PO TABS: 1.0000 | ORAL_TABLET | Freq: Four times a day (QID) | ORAL | 0 refills | Status: DC | PRN

## 2017-01-09 MED ORDER — SYNTHROID 88 MCG PO TABS: 88.0000 ug | ORAL_TABLET | Freq: Every day | ORAL | 1 refills | Status: DC

## 2017-01-09 NOTE — Progress Notes (Signed)
 Subjective:    Patient ID: Jasmine White, female    DOB: 06/02/1946, 71 y.o.   MRN: [id number]  HPI  Graylyn M Maul is here today for annual physical exam, PAP and follow up of chronic medical problem.  Outpatient Encounter Prescriptions as of 01/09/2017  Medication Sig  . amitriptyline (ELAVIL) 50 MG tablet Take 1 tablet (50 mg total) by mouth at bedtime.  . aspirin EC 81 MG tablet Take 81 mg by mouth daily.  . Coenzyme Q10 (CO Q-10) 200 MG CAPS Take 1 capsule by mouth daily.  . Garlic 2000 MG CAPS Take 1 capsule by mouth daily. Take 1000 mg daily  . HYDROcodone-acetaminophen (NORCO/VICODIN) 5-325 MG tablet Take 1 tablet by mouth every 6 (six) hours as needed for moderate pain.  . losartan-hydrochlorothiazide (HYZAAR) 100-12.5 MG tablet Take 1 tablet by mouth daily.  . Lutein 20 MG CAPS Take 1 capsule by mouth daily.  . metoprolol succinate (TOPROL-XL) 100 MG 24 hr tablet Take 1 tablet (100 mg total) by mouth daily. Take with or immediately following a meal.  . Omega-3 Fatty Acids (FISH OIL) 1200 MG CAPS Take by mouth.  . pravastatin (PRAVACHOL) 40 MG tablet Take 1 tablet (40 mg total) by mouth daily.  . Saline (SIMPLY SALINE) 0.9 % AERS Place 1 spray into the nose daily as needed. For sinuses  . SYNTHROID 88 MCG tablet Take 1 tablet (88 mcg total) by mouth daily before breakfast.   No facility-administered encounter medications on file as of 01/09/2017.     1. Annual physical exam  Currently has no complaints  2. Hypothyroidism due to acquired atrophy of thyroid  No problems that she is aware of  3. Essential hypertension, benign  NO c/o chest pain, SOB or HA- does not check blood pressures at home  4. BMI 27.0-27.9,adult  No recent weight gain or weight loss  5. Recurrent major depressive disorder, in full remission (HCC)  Seems to be doing well right now. Depression screen PHQ 2/9 01/09/2017 07/11/2016 12/28/2015 07/13/2015 06/29/2015  Decreased Interest 0 0 0 0 0  Down,  Depressed, Hopeless 0 0 0 0 0  PHQ - 2 Score 0 0 0 0 0  Altered sleeping - - - - -  Tired, decreased energy - - - - -  Change in appetite - - - - -  Feeling bad or failure about yourself  - - - - -  Trouble concentrating - - - - -  Moving slowly or fidgety/restless - - - - -  Suicidal thoughts - - - - -  PHQ-9 Score - - - - -     6. Hyperlipidemia with target LDL less than 130  Not very good at watching diet- currently on statin without c/o myalgia    New complaints: None today     Review of Systems  Constitutional: Negative for diaphoresis.  Eyes: Negative for pain.  Respiratory: Negative for shortness of breath.   Cardiovascular: Negative for chest pain, palpitations and leg swelling.  Gastrointestinal: Negative for abdominal pain.  Endocrine: Negative for polydipsia.  Skin: Negative for rash.  Neurological: Negative for dizziness, weakness and headaches.  Hematological: Does not bruise/bleed easily.  All other systems reviewed and are negative.      Objective:   Physical Exam  Constitutional: She is oriented to person, place, and time. She appears well-developed and well-nourished.  HENT:  Head: Normocephalic.  Right Ear: Hearing, tympanic membrane, external ear and ear canal normal.    Left Ear: Hearing, tympanic membrane, external ear and ear canal normal.  Nose: Nose normal.  Mouth/Throat: Uvula is midline and oropharynx is clear and moist.  Eyes: Conjunctivae and EOM are normal. Pupils are equal, round, and reactive to light.  Neck: Normal range of motion and full passive range of motion without pain. Neck supple. No JVD present. Carotid bruit is not present. No thyroid mass and no thyromegaly present.  Cardiovascular: Normal rate, normal heart sounds and intact distal pulses.   No murmur heard. Pulmonary/Chest: Effort normal and breath sounds normal. Right breast exhibits no inverted nipple, no mass, no nipple discharge, no skin change and no tenderness. Left  breast exhibits no inverted nipple, no mass, no nipple discharge, no skin change and no tenderness.  Abdominal: Soft. Bowel sounds are normal. She exhibits no mass. There is no tenderness.  Genitourinary: Vagina normal and uterus normal. No breast swelling, tenderness, discharge or bleeding.  Genitourinary Comments: bimanual exam-No adnexal masses or tenderness. Vaginal cuff intact Large cystocle and rectocele  Musculoskeletal: Normal range of motion.  Lymphadenopathy:    She has no cervical adenopathy.  Neurological: She is alert and oriented to person, place, and time.  Skin: Skin is warm and dry.  Psychiatric: She has a normal mood and affect. Her behavior is normal. Judgment and thought content normal.   BP (!) 148/84   Pulse 64   Temp (!) 96.9 F (36.1 C) (Oral)   Ht 5' 6" (1.676 m)   Wt 166 lb (75.3 kg)   BMI 26.79 kg/m        Assessment & Plan:  1. Annual physical exam - Urinalysis, Complete - CBC with Differential/Platelet  2. Hypothyroidism due to acquired atrophy of thyroid - SYNTHROID 88 MCG tablet; Take 1 tablet (88 mcg total) by mouth daily before breakfast.  Dispense: 90 tablet; Refill: 1 - Thyroid Panel With TSH  3. Essential hypertension, benign Low sodium diet - metoprolol succinate (TOPROL-XL) 100 MG 24 hr tablet; Take 1 tablet (100 mg total) by mouth daily. Take with or immediately following a meal.  Dispense: 90 tablet; Refill: 1 - losartan-hydrochlorothiazide (HYZAAR) 100-12.5 MG tablet; Take 1 tablet by mouth daily.  Dispense: 90 tablet; Refill: 1 - CMP14+EGFR  4. BMI 27.0-27.9,adult Discussed diet and exercise for person with BMI >25 Will recheck weight in 3-6 months  5. Recurrent major depressive disorder, in full remission (HCC) Stress management  6. Hyperlipidemia with target LDL less than 130 Low fat diet - pravastatin (PRAVACHOL) 40 MG tablet; Take 1 tablet (40 mg total) by mouth daily.  Dispense: 90 tablet; Refill: 1 - Lipid panel  7.  Primary insomnia Bedtime routine - amitriptyline (ELAVIL) 50 MG tablet; Take 1 tablet (50 mg total) by mouth at bedtime.  Dispense: 90 tablet; Refill: 0  8. Gynecologic exam normal - IGP, Aptima HPV, rfx 16/18,45    Labs pending Health maintenance reviewed Diet and exercise encouraged Continue all meds Follow up  In 6 months   [name] Martin, FNP   

## 2017-01-09 NOTE — Patient Instructions (Signed)
Bone Health Bones protect organs, store calcium, and anchor muscles. Good health habits, such as eating nutritious foods and exercising regularly, are important for maintaining healthy bones. They can also help to prevent a condition that causes bones to lose density and become weak and brittle (osteoporosis). Why is bone mass important? Bone mass refers to the amount of bone tissue that you have. The higher your bone mass, the stronger your bones. An important step toward having healthy bones throughout life is to have strong and dense bones during childhood. A young adult who has a high bone mass is more likely to have a high bone mass later in life. Bone mass at its greatest it is called peak bone mass. A large decline in bone mass occurs in older adults. In women, it occurs about the time of menopause. During this time, it is important to practice good health habits, because if more bone is lost than what is replaced, the bones will become less healthy and more likely to break (fracture). If you find that you have a low bone mass, you may be able to prevent osteoporosis or further bone loss by changing your diet and lifestyle. How can I find out if my bone mass is low? Bone mass can be measured with an X-ray test that is called a bone mineral density (BMD) test. This test is recommended for all women who are age 65 or older. It may also be recommended for men who are age 70 or older, or for people who are more likely to develop osteoporosis due to:  Having bones that break easily.  Having a long-term disease that weakens bones, such as kidney disease or rheumatoid arthritis.  Having menopause earlier than normal.  Taking medicine that weakens bones, such as steroids, thyroid hormones, or hormone treatment for breast cancer or prostate cancer.  Smoking.  Drinking three or more alcoholic drinks each day. What are the nutritional recommendations for healthy bones? To have healthy bones, you need  to get enough of the right minerals and vitamins. Most nutrition experts recommend getting these nutrients from the foods that you eat. Nutritional recommendations vary from person to person. Ask your health care provider what is healthy for you. Here are some general guidelines. Calcium Recommendations  Calcium is the most important (essential) mineral for bone health. Most people can get enough calcium from their diet, but supplements may be recommended for people who are at risk for osteoporosis. Good sources of calcium include:  Dairy products, such as low-fat or nonfat milk, cheese, and yogurt.  Dark green leafy vegetables, such as bok choy and broccoli.  Calcium-fortified foods, such as orange juice, cereal, bread, soy beverages, and tofu products.  Nuts, such as almonds. Follow these recommended amounts for daily calcium intake:  Children, age 1?3: 700 mg.  Children, age 4?8: 1,000 mg.  Children, age 9?13: 1,300 mg.  Teens, age 14?18: 1,300 mg.  Adults, age 19?50: 1,000 mg.  Adults, age 51?70:  Men: 1,000 mg.  Women: 1,200 mg.  Adults, age 71 or older: 1,200 mg.  Pregnant and breastfeeding females:  Teens: 1,300 mg.  Adults: 1,000 mg. Vitamin D Recommendations  Vitamin D is the most essential vitamin for bone health. It helps the body to absorb calcium. Sunlight stimulates the skin to make vitamin D, so be sure to get enough sunlight. If you live in a cold climate or you do not get outside often, your health care provider may recommend that you take vitamin D   supplements. Good sources of vitamin D in your diet include:  Egg yolks.  Saltwater fish.  Milk and cereal fortified with vitamin D. Follow these recommended amounts for daily vitamin D intake:  Children and teens, age 1?18: 600 international units.  Adults, age 50 or younger: 400-800 international units.  Adults, age 51 or older: 800-1,000 international units. Other Nutrients  Other nutrients for bone  health include:  Phosphorus. This mineral is found in meat, poultry, dairy foods, nuts, and legumes. The recommended daily intake for adult men and adult women is 700 mg.  Magnesium. This mineral is found in seeds, nuts, dark green vegetables, and legumes. The recommended daily intake for adult men is 400?420 mg. For adult women, it is 310?320 mg.  Vitamin K. This vitamin is found in green leafy vegetables. The recommended daily intake is 120 mg for adult men and 90 mg for adult women. What type of physical activity is best for building and maintaining healthy bones? Weight-bearing and strength-building activities are important for building and maintaining peak bone mass. Weight-bearing activities cause muscles and bones to work against gravity. Strength-building activities increases muscle strength that supports bones. Weight-bearing and muscle-building activities include:  Walking and hiking.  Jogging and running.  Dancing.  Gym exercises.  Lifting weights.  Tennis and racquetball.  Climbing stairs.  Aerobics. Adults should get at least 30 minutes of moderate physical activity on most days. Children should get at least 60 minutes of moderate physical activity on most days. Ask your health care provide what type of exercise is best for you. Where can I find more information? For more information, check out the following websites:  National Osteoporosis Foundation: http://nof.org/learn/basics  National Institutes of Health: http://www.niams.nih.gov/Health_Info/Bone/Bone_Health/bone_health_for_life.asp This information is not intended to replace advice given to you by your health care provider. Make sure you discuss any questions you have with your health care provider. Document Released: 11/29/2003 Document Revised: 03/28/2016 Document Reviewed: 09/13/2014 Elsevier Interactive Patient Education  2017 [employer]vier Inc.  

## 2017-01-10 LAB — LIPID PANEL
Chol/HDL Ratio: 2.6 ratio (ref 0.0–4.4)
Cholesterol, Total: 187 mg/dL (ref 100–199)
HDL: 72 mg/dL (ref >39)
LDL Calculated: 96 mg/dL (ref 0–99)
TRIGLYCERIDES: 95 mg/dL (ref 0–149)
VLDL CHOLESTEROL CAL: 19 mg/dL (ref 5–40)

## 2017-01-10 LAB — CMP14+EGFR
A/G RATIO: 1.6 (ref 1.2–2.2)
ALT: 20 IU/L (ref 0–32)
AST: 44 IU/L — ABNORMAL HIGH (ref 0–40)
Albumin: 4.6 g/dL (ref 3.5–4.8)
Alkaline Phosphatase: 106 IU/L (ref 39–117)
BILIRUBIN TOTAL: 0.5 mg/dL (ref 0.0–1.2)
BUN/Creatinine Ratio: 15 (ref 12–28)
BUN: 11 mg/dL (ref 8–27)
CALCIUM: 9.9 mg/dL (ref 8.7–10.3)
CHLORIDE: 91 mmol/L — AB (ref 96–106)
CO2: 27 mmol/L (ref 18–29)
Creatinine, Ser: 0.74 mg/dL (ref 0.57–1.00)
GFR calc Af Amer: 94 mL/min/{1.73_m2} (ref >59)
GFR calc non Af Amer: 82 mL/min/{1.73_m2} (ref >59)
GLUCOSE: 99 mg/dL (ref 65–99)
Globulin, Total: 2.9 g/dL (ref 1.5–4.5)
POTASSIUM: 4.2 mmol/L (ref 3.5–5.2)
Sodium: 133 mmol/L — ABNORMAL LOW (ref 134–144)
Total Protein: 7.5 g/dL (ref 6.0–8.5)

## 2017-01-10 LAB — CBC WITH DIFFERENTIAL/PLATELET
BASOS ABS: 0.1 10*3/uL (ref 0.0–0.2)
Basos: 1 %
EOS (ABSOLUTE): 0.2 10*3/uL (ref 0.0–0.4)
Eos: 3 %
Hematocrit: 42.5 % (ref 34.0–46.6)
Hemoglobin: 14.4 g/dL (ref 11.1–15.9)
IMMATURE GRANULOCYTES: 0 %
Immature Grans (Abs): 0 10*3/uL (ref 0.0–0.1)
Lymphocytes Absolute: 1.8 10*3/uL (ref 0.7–3.1)
Lymphs: 25 %
MCH: 29.1 pg (ref 26.6–33.0)
MCHC: 33.9 g/dL (ref 31.5–35.7)
MCV: 86 fL (ref 79–97)
MONOS ABS: 0.6 10*3/uL (ref 0.1–0.9)
Monocytes: 8 %
NEUTROS PCT: 63 %
Neutrophils Absolute: 4.7 10*3/uL (ref 1.4–7.0)
PLATELETS: 260 10*3/uL (ref 150–379)
RBC: 4.95 x10E6/uL (ref 3.77–5.28)
RDW: 13.4 % (ref 12.3–15.4)
WBC: 7.4 10*3/uL (ref 3.4–10.8)

## 2017-01-10 LAB — THYROID PANEL WITH TSH
Free Thyroxine Index: 2.6 (ref 1.2–4.9)
T3 UPTAKE RATIO: 27 % (ref 24–39)
T4, Total: 9.5 ug/dL (ref 4.5–12.0)
TSH: 1.24 u[IU]/mL (ref 0.450–4.500)

## 2017-01-13 LAB — IGP, APTIMA HPV, RFX 16/18,45
HPV APTIMA: NEGATIVE
PAP SMEAR COMMENT: 0

## 2017-01-15 NOTE — Progress Notes (Signed)
Patient aware to return stool card

## 2017-04-30 DIAGNOSIS — D485 Neoplasm of uncertain behavior of skin: Secondary | ICD-10-CM | POA: Diagnosis not present

## 2017-04-30 DIAGNOSIS — L814 Other melanin hyperpigmentation: Secondary | ICD-10-CM | POA: Diagnosis not present

## 2017-04-30 DIAGNOSIS — L570 Actinic keratosis: Secondary | ICD-10-CM | POA: Diagnosis not present

## 2017-04-30 DIAGNOSIS — L821 Other seborrheic keratosis: Secondary | ICD-10-CM | POA: Diagnosis not present

## 2017-04-30 DIAGNOSIS — C44719 Basal cell carcinoma of skin of left lower limb, including hip: Secondary | ICD-10-CM | POA: Diagnosis not present

## 2017-04-30 DIAGNOSIS — Z85828 Personal history of other malignant neoplasm of skin: Secondary | ICD-10-CM | POA: Diagnosis not present

## 2017-04-30 DIAGNOSIS — D225 Melanocytic nevi of trunk: Secondary | ICD-10-CM | POA: Diagnosis not present

## 2017-04-30 DIAGNOSIS — L720 Epidermal cyst: Secondary | ICD-10-CM | POA: Diagnosis not present

## 2017-07-17 ENCOUNTER — Ambulatory Visit (INDEPENDENT_AMBULATORY_CARE_PROVIDER_SITE_OTHER): Payer: Medicare PPO | Admitting: Nurse Practitioner

## 2017-07-17 ENCOUNTER — Encounter: Payer: Self-pay | Admitting: Nurse Practitioner

## 2017-07-17 VITALS — BP 127/77 | HR 77 | Temp 97.1°F | Ht 66.0 in | Wt 170.0 lb

## 2017-07-17 DIAGNOSIS — M545 Low back pain, unspecified: Secondary | ICD-10-CM

## 2017-07-17 DIAGNOSIS — F5101 Primary insomnia: Secondary | ICD-10-CM

## 2017-07-17 DIAGNOSIS — E034 Atrophy of thyroid (acquired): Secondary | ICD-10-CM | POA: Diagnosis not present

## 2017-07-17 DIAGNOSIS — F3342 Major depressive disorder, recurrent, in full remission: Secondary | ICD-10-CM | POA: Diagnosis not present

## 2017-07-17 DIAGNOSIS — G8929 Other chronic pain: Secondary | ICD-10-CM | POA: Diagnosis not present

## 2017-07-17 DIAGNOSIS — I10 Essential (primary) hypertension: Secondary | ICD-10-CM

## 2017-07-17 DIAGNOSIS — E785 Hyperlipidemia, unspecified: Secondary | ICD-10-CM

## 2017-07-17 DIAGNOSIS — Z6827 Body mass index (BMI) 27.0-27.9, adult: Secondary | ICD-10-CM | POA: Diagnosis not present

## 2017-07-17 MED ORDER — SYNTHROID 88 MCG PO TABS: 88.0000 ug | ORAL_TABLET | Freq: Every day | ORAL | 1 refills | Status: DC

## 2017-07-17 MED ORDER — METOPROLOL SUCCINATE ER 100 MG PO TB24: 100.0000 mg | ORAL_TABLET | Freq: Every day | ORAL | 1 refills | Status: DC

## 2017-07-17 MED ORDER — AMITRIPTYLINE HCL 50 MG PO TABS: 50.0000 mg | ORAL_TABLET | Freq: Every day | ORAL | 1 refills | Status: DC

## 2017-07-17 MED ORDER — HYDROCODONE-ACETAMINOPHEN 5-325 MG PO TABS: 1.0000 | ORAL_TABLET | Freq: Four times a day (QID) | ORAL | 0 refills | Status: DC | PRN

## 2017-07-17 MED ORDER — LOSARTAN POTASSIUM-HCTZ 100-12.5 MG PO TABS: 1.0000 | ORAL_TABLET | Freq: Every day | ORAL | 1 refills | Status: DC

## 2017-07-17 MED ORDER — PRAVASTATIN SODIUM 40 MG PO TABS: 40.0000 mg | ORAL_TABLET | Freq: Every day | ORAL | 1 refills | Status: DC

## 2017-07-17 NOTE — Progress Notes (Addendum)
Subjective:    Patient ID: Jasmine White, female    DOB: 1946/01/11, 71 y.o.   MRN: [id number]  HPI  Jasmine White is here today for follow up of chronic medical problem.  Outpatient Encounter Prescriptions as of 07/17/2017  Medication Sig  . amitriptyline (ELAVIL) 50 MG tablet Take 1 tablet (50 mg total) by mouth at bedtime.  Marland Kitchen aspirin EC 81 MG tablet Take 81 mg by mouth daily.  . Coenzyme Q10 (CO Q-10) 200 MG CAPS Take 1 capsule by mouth daily.  . Garlic 2103 MG CAPS Take 1 capsule by mouth daily. Take 1000 mg daily  . HYDROcodone-acetaminophen (NORCO/VICODIN) 5-325 MG tablet Take 1 tablet by mouth every 6 (six) hours as needed for moderate pain.  Marland Kitchen losartan-hydrochlorothiazide (HYZAAR) 100-12.5 MG tablet Take 1 tablet by mouth daily.  . Lutein 20 MG CAPS Take 1 capsule by mouth daily.  . metoprolol succinate (TOPROL-XL) 100 MG 24 hr tablet Take 1 tablet (100 mg total) by mouth daily. Take with or immediately following a meal.  . Omega-3 Fatty Acids (FISH OIL) 1200 MG CAPS Take by mouth.  . pravastatin (PRAVACHOL) 40 MG tablet Take 1 tablet (40 mg total) by mouth daily.  . Saline (SIMPLY SALINE) 0.9 % AERS Place 1 spray into the nose daily as needed. For sinuses  . SYNTHROID 88 MCG tablet Take 1 tablet (88 mcg total) by mouth daily before breakfast.   No facility-administered encounter medications on file as of 07/17/2017.     1. Essential hypertension, benign  No c/o chest pain , sob or headache. She occasionally checks her blood pressure at home and it always is running around 128'F systolic. BP Readings from Last 3 Encounters:  01/09/17 (!) 148/84  07/11/16 132/81  12/28/15 (!) 141/91     2. Hypothyroidism due to acquired atrophy of thyroid  No problems that she is aware of  3. Hyperlipidemia with target LDL less than 130  She tries to watch fats in diet- does avoid fried foods  4. Recurrent major depressive disorder, in full remission [facility])  she is doing well right  now and is not taking any medication. Depression screen [facility] 2/9 07/17/2017 01/09/2017 07/11/2016 12/28/2015 07/13/2015  Decreased Interest 0 0 0 0 0  Down, Depressed, Hopeless 0 0 0 0 0  PHQ - 2 Score 0 0 0 0 0  Altered sleeping - - - - -  Tired, decreased energy - - - - -  Change in appetite - - - - -  Feeling bad or failure about yourself  - - - - -  Trouble concentrating - - - - -  Moving slowly or fidgety/restless - - - - -  Suicidal thoughts - - - - -  PHQ-9 Score - - - - -     5. Chronic midline low back pain without sciatica  Has pain daily but only takes pain meds when it is unbearable.Marland Kitchen Has not had norco filled since April of 2018.  6. BMI 27.0-27.9,adult  No recent weight changes  7.     insomnia         amitriptylline works well to help hr sleep   New complaints: None today  Social history: Takes care of her ,other    Review of Systems  Constitutional: Negative for activity change and appetite change.  HENT: Negative.   Eyes: Negative for pain.  Respiratory: Negative for shortness of breath.   Cardiovascular: Negative for chest pain, palpitations and  leg swelling.  Gastrointestinal: Negative for abdominal pain.  Endocrine: Negative for polydipsia.  Genitourinary: Negative.   Skin: Negative for rash.  Neurological: Negative for dizziness, weakness and headaches.  Hematological: Does not bruise/bleed easily.  Psychiatric/Behavioral: Negative.   All other systems reviewed and are negative.      Objective:   Physical Exam  Constitutional: She is oriented to person, place, and time. She appears well-developed and well-nourished.  HENT:  Nose: Nose normal.  Mouth/Throat: Oropharynx is clear and moist.  Eyes: EOM are normal.  Neck: Trachea normal, normal range of motion and full passive range of motion without pain. Neck supple. No JVD present. Carotid bruit is not present. No thyromegaly present.  Cardiovascular: Normal rate, regular rhythm, normal heart  sounds and intact distal pulses.  Exam reveals no gallop and no friction rub.   No murmur heard. Pulmonary/Chest: Effort normal and breath sounds normal.  Abdominal: Soft. Bowel sounds are normal. She exhibits no distension and no mass. There is no tenderness.  Musculoskeletal: Normal range of motion.  Lymphadenopathy:    She has no cervical adenopathy.  Neurological: She is alert and oriented to person, place, and time. She has normal reflexes.  Skin: Skin is warm and dry.  Psychiatric: She has a normal mood and affect. Her behavior is normal. Judgment and thought content normal.    BP 127/77   Pulse 77   Temp (!) 97.1 F (36.2 C) (Oral)   Ht '5\' 6"'  (1.676 m)   Wt 170 lb (77.1 kg)   BMI 27.44 kg/m       Assessment & Plan:  1. Essential hypertension, benign Low sodium diet - metoprolol succinate (TOPROL-XL) 100 MG 24 hr tablet; Take 1 tablet (100 mg total) by mouth daily. Take with or immediately following a meal.  Dispense: 90 tablet; Refill: 1 - losartan-hydrochlorothiazide (HYZAAR) 100-12.5 MG tablet; Take 1 tablet by mouth daily.  Dispense: 90 tablet; Refill: 1  2. Hypothyroidism due to acquired atrophy of thyroid - SYNTHROID 88 MCG tablet; Take 1 tablet (88 mcg total) by mouth daily before breakfast.  Dispense: 90 tablet; Refill: 1  3. Hyperlipidemia with target LDL less than 130 Low fat diet - pravastatin (PRAVACHOL) 40 MG tablet; Take 1 tablet (40 mg total) by mouth daily.  Dispense: 90 tablet; Refill: 1  4. Recurrent major depressive disorder, in full remission ([place]) Stress management  5. Chronic midline low back pain without sciatica Back stretches - HYDROcodone-acetaminophen (NORCO/VICODIN) 5-325 MG tablet; Take 1 tablet by mouth every 6 (six) hours as needed for moderate pain.  Dispense: 40 tablet; Refill: 0  6. BMI 27.0-27.9,adult Discussed diet and exercise for person with BMI >25 Will recheck weight in 3-6 months   7. Primary insomnia Bedtime routine -  amitriptyline (ELAVIL) 50 MG tablet; Take 1 tablet (50 mg total) by mouth at bedtime.  Dispense: 90 tablet; Refill: 1  Orders Placed This Encounter  Procedures  . CMP14+EGFR  . Lipid panel     Labs pending Health maintenance reviewed Diet and exercise encouraged Continue all meds Follow up  In 6 months   Harper, FNP

## 2017-07-17 NOTE — Patient Instructions (Signed)
DASH Eating Plan DASH stands for "Dietary Approaches to Stop Hypertension." The DASH eating plan is a healthy eating plan that has been shown to reduce high blood pressure (hypertension). It may also reduce your risk for type 2 diabetes, heart disease, and stroke. The DASH eating plan may also help with weight loss. What are tips for following this plan? General guidelines  Avoid eating more than 2,300 mg (milligrams) of salt (sodium) a day. If you have hypertension, you may need to reduce your sodium intake to 1,500 mg a day.  Limit alcohol intake to no more than 1 drink a day for nonpregnant women and 2 drinks a day for men. One drink equals 12 oz of beer, 5 oz of wine, or 1 oz of hard liquor.  Work with your health care provider to maintain a healthy body weight or to lose weight. Ask what an ideal weight is for you.  Get at least 30 minutes of exercise that causes your heart to beat faster (aerobic exercise) most days of the week. Activities may include walking, swimming, or biking.  Work with your health care provider or diet and nutrition specialist (dietitian) to adjust your eating plan to your individual calorie needs. Reading food labels  Check food labels for the amount of sodium per serving. Choose foods with less than 5 percent of the Daily Value of sodium. Generally, foods with less than 300 mg of sodium per serving fit into this eating plan.  To find whole grains, look for the word "whole" as the first word in the ingredient list. Shopping  Buy products labeled as "low-sodium" or "no salt added."  Buy fresh foods. Avoid canned foods and premade or frozen meals. Cooking  Avoid adding salt when cooking. Use salt-free seasonings or herbs instead of table salt or sea salt. Check with your health care provider or pharmacist before using salt substitutes.  Do not fry foods. Cook foods using healthy methods such as baking, boiling, grilling, and broiling instead.  Cook with  heart-healthy oils, such as olive, canola, soybean, or sunflower oil. Meal planning   Eat a balanced diet that includes: ? 5 or more servings of fruits and vegetables each day. At each meal, try to fill half of your plate with fruits and vegetables. ? Up to 6-8 servings of whole grains each day. ? Less than 6 oz of lean meat, poultry, or fish each day. A 3-oz serving of meat is about the same size as a deck of cards. One egg equals 1 oz. ? 2 servings of low-fat dairy each day. ? A serving of nuts, seeds, or beans 5 times each week. ? Heart-healthy fats. Healthy fats called Omega-3 fatty acids are found in foods such as flaxseeds and coldwater fish, like sardines, salmon, and mackerel.  Limit how much you eat of the following: ? Canned or prepackaged foods. ? Food that is high in trans fat, such as fried foods. ? Food that is high in saturated fat, such as fatty meat. ? Sweets, desserts, sugary drinks, and other foods with added sugar. ? Full-fat dairy products.  Do not salt foods before eating.  Try to eat at least 2 vegetarian meals each week.  Eat more home-cooked food and less restaurant, buffet, and fast food.  When eating at a restaurant, ask that your food be prepared with less salt or no salt, if possible. What foods are recommended? The items listed may not be a complete list. Talk with your dietitian about what   dietary choices are best for you. Grains Whole-grain or whole-wheat bread. Whole-grain or whole-wheat pasta. Brown rice. Oatmeal. Quinoa. Bulgur. Whole-grain and low-sodium cereals. Pita bread. Low-fat, low-sodium crackers. Whole-wheat flour tortillas. Vegetables Fresh or frozen vegetables (raw, steamed, roasted, or grilled). Low-sodium or reduced-sodium tomato and vegetable juice. Low-sodium or reduced-sodium tomato sauce and tomato paste. Low-sodium or reduced-sodium canned vegetables. Fruits All fresh, dried, or frozen fruit. Canned fruit in natural juice (without  added sugar). Meat and other protein foods Skinless chicken or turkey. Ground chicken or turkey. Pork with fat trimmed off. Fish and seafood. Egg whites. Dried beans, peas, or lentils. Unsalted nuts, nut butters, and seeds. Unsalted canned beans. Lean cuts of beef with fat trimmed off. Low-sodium, lean deli meat. Dairy Low-fat (1%) or fat-free (skim) milk. Fat-free, low-fat, or reduced-fat cheeses. Nonfat, low-sodium ricotta or cottage cheese. Low-fat or nonfat yogurt. Low-fat, low-sodium cheese. Fats and oils Soft margarine without trans fats. Vegetable oil. Low-fat, reduced-fat, or light mayonnaise and salad dressings (reduced-sodium). Canola, safflower, olive, soybean, and sunflower oils. Avocado. Seasoning and other foods Herbs. Spices. Seasoning mixes without salt. Unsalted popcorn and pretzels. Fat-free sweets. What foods are not recommended? The items listed may not be a complete list. Talk with your dietitian about what dietary choices are best for you. Grains Baked goods made with fat, such as croissants, muffins, or some breads. Dry pasta or rice meal packs. Vegetables Creamed or fried vegetables. Vegetables in a cheese sauce. Regular canned vegetables (not low-sodium or reduced-sodium). Regular canned tomato sauce and paste (not low-sodium or reduced-sodium). Regular tomato and vegetable juice (not low-sodium or reduced-sodium). Pickles. Olives. Fruits Canned fruit in a light or heavy syrup. Fried fruit. Fruit in cream or butter sauce. Meat and other protein foods Fatty cuts of meat. Ribs. Fried meat. Bacon. Sausage. Bologna and other processed lunch meats. Salami. Fatback. Hotdogs. Bratwurst. Salted nuts and seeds. Canned beans with added salt. Canned or smoked fish. Whole eggs or egg yolks. Chicken or turkey with skin. Dairy Whole or 2% milk, cream, and half-and-half. Whole or full-fat cream cheese. Whole-fat or sweetened yogurt. Full-fat cheese. Nondairy creamers. Whipped toppings.  Processed cheese and cheese spreads. Fats and oils Butter. Stick margarine. Lard. Shortening. Ghee. Bacon fat. Tropical oils, such as coconut, palm kernel, or palm oil. Seasoning and other foods Salted popcorn and pretzels. Onion salt, garlic salt, seasoned salt, table salt, and sea salt. Worcestershire sauce. Tartar sauce. Barbecue sauce. Teriyaki sauce. Soy sauce, including reduced-sodium. Steak sauce. Canned and packaged gravies. Fish sauce. Oyster sauce. Cocktail sauce. Horseradish that you find on the shelf. Ketchup. Mustard. Meat flavorings and tenderizers. Bouillon cubes. Hot sauce and Tabasco sauce. Premade or packaged marinades. Premade or packaged taco seasonings. Relishes. Regular salad dressings. Where to find more information:  National Heart, Lung, and Blood Institute: www.nhlbi.nih.gov  American Heart Association: www.heart.org Summary  The DASH eating plan is a healthy eating plan that has been shown to reduce high blood pressure (hypertension). It may also reduce your risk for type 2 diabetes, heart disease, and stroke.  With the DASH eating plan, you should limit salt (sodium) intake to 2,300 mg a day. If you have hypertension, you may need to reduce your sodium intake to 1,500 mg a day.  When on the DASH eating plan, aim to eat more fresh fruits and vegetables, whole grains, lean proteins, low-fat dairy, and heart-healthy fats.  Work with your health care provider or diet and nutrition specialist (dietitian) to adjust your eating plan to your individual   calorie needs. This information is not intended to replace advice given to you by your health care provider. Make sure you discuss any questions you have with your health care provider. Document Released: 08/28/2011 Document Revised: 09/01/2016 Document Reviewed: 09/01/2016 Elsevier Interactive Patient Education  2017 [employer]vier Inc.  

## 2017-07-17 NOTE — Addendum Note (Signed)
Addended by: Chevis Pretty on: 07/17/2017 08:30 AM   Modules accepted: Orders

## 2017-07-18 LAB — CMP14+EGFR
ALBUMIN: 4.6 g/dL (ref 3.5–4.8)
ALK PHOS: 90 IU/L (ref 39–117)
ALT: 15 IU/L (ref 0–32)
AST: 33 IU/L (ref 0–40)
Albumin/Globulin Ratio: 1.8 (ref 1.2–2.2)
BILIRUBIN TOTAL: 0.4 mg/dL (ref 0.0–1.2)
BUN / CREAT RATIO: 32 — AB (ref 12–28)
BUN: 26 mg/dL (ref 8–27)
CHLORIDE: 92 mmol/L — AB (ref 96–106)
CO2: 24 mmol/L (ref 20–29)
CREATININE: 0.81 mg/dL (ref 0.57–1.00)
Calcium: 9.7 mg/dL (ref 8.7–10.3)
GFR calc Af Amer: 84 mL/min/{1.73_m2} (ref >59)
GFR calc non Af Amer: 73 mL/min/{1.73_m2} (ref >59)
GLOBULIN, TOTAL: 2.6 g/dL (ref 1.5–4.5)
Glucose: 100 mg/dL — ABNORMAL HIGH (ref 65–99)
Potassium: 3.8 mmol/L (ref 3.5–5.2)
SODIUM: 134 mmol/L (ref 134–144)
Total Protein: 7.2 g/dL (ref 6.0–8.5)

## 2017-07-18 LAB — LIPID PANEL
CHOL/HDL RATIO: 3.1 ratio (ref 0.0–4.4)
Cholesterol, Total: 173 mg/dL (ref 100–199)
HDL: 55 mg/dL (ref >39)
LDL CALC: 97 mg/dL (ref 0–99)
Triglycerides: 106 mg/dL (ref 0–149)
VLDL CHOLESTEROL CAL: 21 mg/dL (ref 5–40)

## 2017-07-21 ENCOUNTER — Ambulatory Visit (INDEPENDENT_AMBULATORY_CARE_PROVIDER_SITE_OTHER): Payer: Medicare PPO | Admitting: *Deleted

## 2017-07-21 VITALS — BP 129/78 | HR 74 | Temp 97.8°F | Ht 66.0 in | Wt 171.2 lb

## 2017-07-21 DIAGNOSIS — Z0000 Encounter for general adult medical examination without abnormal findings: Secondary | ICD-10-CM

## 2017-07-21 NOTE — Progress Notes (Signed)
Subjective:   Jasmine White is a 71 y.o. female who presents for Medicare Annual (Subsequent) preventive examination.  Jasmine [facility] is retired from [employer] of [employer] years.  She lives at home with her husband.  Jasmine White takes care of her grandchildren and mother.  Jasmine White currently exercises 3-4 times weekly for 2 hours.  She only eats two meals daily.  Overall Jasmine White feels that her health is about the same as it was a year ago       Objective:     Vitals: BP 129/78   Pulse 74   Temp 97.8 F (36.6 C) (Oral)   Ht 5\' 6"  (1.676 m)   Wt 171 lb 3.2 oz (77.7 kg)   BMI 27.63 kg/m   Body mass index is 27.63 kg/m.   Tobacco History  Smoking Status  . Never Smoker  Smokeless Tobacco  . Never Used     No Tobacco Use  Past Medical History:  Diagnosis Date  . Arthritis   . Bruises easily   . Cancer ([place])    basal skin cancer  . Chronic back pain    HNP/stenosis and radiculopathy  . Hyperlipidemia    takes Pravastatin daily  . Hypertension    takes Hyzaar and toprol daily  . Hypothyroidism    takes Synthroid daily  . Insomnia    takes Elevil nightly  . Joint pain   . Joint swelling   . Low BP    past some sedation  . Nocturia   . PONV (postoperative nausea and vomiting)    with knee replacement b/p dropped  . Sinusitis    finished zpak yesterday  . Urinary frequency    Past Surgical History:  Procedure Laterality Date  . ABDOMINAL HYSTERECTOMY    . BACK SURGERY    . basal cell skin cancer     on face and forehead  . bil knee replacements    . BREAST BIOPSY     left breast/benign  . LUMBAR LAMINECTOMY/DECOMPRESSION MICRODISCECTOMY  08/13/2012   Procedure: LUMBAR LAMINECTOMY/DECOMPRESSION MICRODISCECTOMY 1 LEVEL;  Surgeon: Jasmine Levine, MD;  Location: [facility];  Service: Neurosurgery;  Laterality: Left;  Left lumbar one-two Microdiskectomy   Family History  Problem Relation Age of Onset  . Hypertension Mother   . Heart disease Mother    . Stroke Father   . Heart disease Father   . Alcohol abuse Father   . Arthritis Brother   . Cancer Brother 37       prostate  . Arthritis Brother    History  Sexual Activity  . Sexual activity: Not Currently    Outpatient Encounter Prescriptions as of 07/21/2017  Medication Sig  . amitriptyline (ELAVIL) 50 MG tablet Take 1 tablet (50 mg total) by mouth at bedtime.  . Coenzyme Q10 (CO Q-10) 200 MG CAPS Take 1 capsule by mouth daily.  . Garlic 8182 MG CAPS Take 1 capsule by mouth daily. Take 1000 mg daily  . HYDROcodone-acetaminophen (NORCO/VICODIN) 5-325 MG tablet Take 1 tablet by mouth every 6 (six) hours as needed for moderate pain.  Marland Kitchen losartan-hydrochlorothiazide (HYZAAR) 100-12.5 MG tablet Take 1 tablet by mouth daily.  . Lutein 20 MG CAPS Take 1 capsule by mouth daily.  . metoprolol succinate (TOPROL-XL) 100 MG 24 hr tablet Take 1 tablet (100 mg total) by mouth daily. Take with or immediately following a meal.  . Omega-3 Fatty Acids (FISH OIL) 1200 MG CAPS Take by mouth.  Marland Kitchen  pravastatin (PRAVACHOL) 40 MG tablet Take 1 tablet (40 mg total) by mouth daily.  . Saline (SIMPLY SALINE) 0.9 % AERS Place 1 spray into the nose daily as needed. For sinuses  . SYNTHROID 88 MCG tablet Take 1 tablet (88 mcg total) by mouth daily before breakfast.  . FLUZONE HIGH-DOSE 0.5 ML injection TO BE ADMINISTERED BY PHARMACIST FOR IMMUNIZATION   No facility-administered encounter medications on file as of 07/21/2017.     Activities of Daily Living In your present state of health, do you have any difficulty performing the following activities: 07/21/2017  Hearing? N  Vision? N  Difficulty concentrating or making decisions? N  Walking or climbing stairs? N  Dressing or bathing? N  Doing errands, shopping? N  Some recent data might be hidden  No difficulties with ADLs  Patient Care Team: [facility], FNP as PCP - General (Nurse Practitioner) Jasmine Arabian, MD as Consulting Physician  (Orthopedic Surgery) Jasmine Levine, MD as Consulting Physician (Neurosurgery)    Assessment:   Exercise Activities and Dietary recommendations Current Exercise Habits: Structured exercise class, Type of exercise: treadmill;walking, Time (Minutes): > 60, Frequency (Times/Week): 4, Weekly Exercise (Minutes/Week): 0, Intensity: Mild  Goals    . Plan meals          Continue to eat healthy meals daily Increase fruits and veggies Increase fluid intake      Fall Risk Fall Risk  07/21/2017 07/17/2017 01/09/2017 07/11/2016 12/28/2015  Falls in the past year? No No No No No   Depression Screen PHQ 2/9 Scores 07/21/2017 07/17/2017 01/09/2017 07/11/2016  PHQ - 2 Score 0 0 0 0  PHQ- 9 Score - - - -     Cognitive Function MMSE - Mini Mental State Exam 07/21/2017 07/13/2015  Orientation to time 5 5  Orientation to Place 5 5  Registration 3 3  Attention/ Calculation 5 5  Recall 3 3  Language- name 2 objects 2 2  Language- repeat 1 1  Language- follow 3 step command 3 3  Language- read & follow direction 1 1  Write a sentence 1 1  Copy design 1 1  Total score 30 30        Immunization History  Administered Date(s) Administered  . Influenza, High Dose Seasonal PF 06/26/2017  . Influenza,inj,Quad PF,6+ Mos 06/29/2013, 07/06/2014, 06/22/2015  . Pneumococcal Conjugate-13 06/29/2015  . Pneumococcal Polysaccharide-23 12/21/2012  . Tdap 12/19/2010   Screening Tests Health Maintenance  Topic Date Due  . MAMMOGRAM  12/12/2018  . PAP SMEAR  01/10/2019  . TETANUS/TDAP  12/02/2022  . COLONOSCOPY  04/16/2025  . INFLUENZA VACCINE  Completed  . DEXA SCAN  Completed  . Hepatitis C Screening  Completed  . PNA vac Low Risk Adult  Completed      Plan:   Eat 3 meals daily that consist of lean proteins, fruits and vegetables.  Continue to exercise weekly.  Declined shingrix at this time.  Keep Follow up appointment with [facility] Done, FNP.   I have personally reviewed and noted the  following in the patient's chart:   . Medical and social history . Use of alcohol, tobacco or illicit drugs  . Current medications and supplements . Functional ability and status . Nutritional status . Physical activity . Advanced directives . List of other physicians . Hospitalizations, surgeries, and ER visits in previous 12 months . Vitals . Screenings to include cognitive, depression, and falls . Referrals and appointments  In addition, I have reviewed and discussed with  patient certain preventive protocols, quality metrics, and best practice recommendations. A written personalized care plan for preventive services as well as general preventive health recommendations were provided to patient.     Wardell Heath, LPN  [id number]  I have reviewed and agree with the above AWV documentation.   [facility] Done, FNP

## 2017-07-21 NOTE — Patient Instructions (Signed)
Keep Follow up appt with MMM on 01/18/2017   Ms. Sarate , Thank you for taking time to come for your Medicare Wellness Visit. I appreciate your ongoing commitment to your health goals. Please review the following plan we discussed and let me know if I can assist you in the future.   These are the goals we discussed: Goals    . Plan meals          Continue to eat healthy meals daily Increase fruits and veggies Increase fluid intake       This is a list of the screening recommended for you and due dates:  Health Maintenance  Topic Date Due  . Mammogram  12/12/2018  . Pap Smear  01/10/2019  . Tetanus Vaccine  12/02/2022  . Colon Cancer Screening  04/16/2025  . Flu Shot  Completed  . DEXA scan (bone density measurement)  Completed  .  Hepatitis C: One time screening is recommended by Center for Disease Control  (CDC) for  adults born from [birth year] through 1966.   Completed  . Pneumonia vaccines  Completed

## 2017-07-23 DIAGNOSIS — H40033 Anatomical narrow angle, bilateral: Secondary | ICD-10-CM | POA: Diagnosis not present

## 2017-07-23 DIAGNOSIS — H2513 Age-related nuclear cataract, bilateral: Secondary | ICD-10-CM | POA: Diagnosis not present

## 2017-10-06 ENCOUNTER — Telehealth: Payer: Self-pay | Admitting: Nurse Practitioner

## 2017-10-06 NOTE — Telephone Encounter (Signed)
Have patient call her pharmacy and make sure hers has been recalled- is only one particular brand that has been recalled

## 2017-10-06 NOTE — Telephone Encounter (Signed)
Spoke with patient, since calling here she had contacted her pharmacy.   Her Losartan is not under a recall so she is fine with continuing this medication.

## 2017-12-21 ENCOUNTER — Encounter: Payer: Self-pay | Admitting: Nurse Practitioner

## 2017-12-21 DIAGNOSIS — Z1231 Encounter for screening mammogram for malignant neoplasm of breast: Secondary | ICD-10-CM | POA: Diagnosis not present

## 2018-01-01 DIAGNOSIS — Z96653 Presence of artificial knee joint, bilateral: Secondary | ICD-10-CM | POA: Diagnosis not present

## 2018-01-01 DIAGNOSIS — Z471 Aftercare following joint replacement surgery: Secondary | ICD-10-CM | POA: Diagnosis not present

## 2018-01-01 DIAGNOSIS — M170 Bilateral primary osteoarthritis of knee: Secondary | ICD-10-CM | POA: Diagnosis not present

## 2018-01-18 ENCOUNTER — Encounter: Payer: Self-pay | Admitting: Nurse Practitioner

## 2018-01-18 ENCOUNTER — Ambulatory Visit (INDEPENDENT_AMBULATORY_CARE_PROVIDER_SITE_OTHER): Payer: Medicare PPO | Admitting: Nurse Practitioner

## 2018-01-18 VITALS — BP 133/82 | HR 75 | Temp 97.3°F | Ht 66.0 in | Wt 165.0 lb

## 2018-01-18 DIAGNOSIS — I10 Essential (primary) hypertension: Secondary | ICD-10-CM | POA: Diagnosis not present

## 2018-01-18 DIAGNOSIS — M545 Low back pain, unspecified: Secondary | ICD-10-CM

## 2018-01-18 DIAGNOSIS — Z0000 Encounter for general adult medical examination without abnormal findings: Secondary | ICD-10-CM

## 2018-01-18 DIAGNOSIS — E034 Atrophy of thyroid (acquired): Secondary | ICD-10-CM

## 2018-01-18 DIAGNOSIS — E785 Hyperlipidemia, unspecified: Secondary | ICD-10-CM

## 2018-01-18 DIAGNOSIS — G8929 Other chronic pain: Secondary | ICD-10-CM

## 2018-01-18 DIAGNOSIS — Z01419 Encounter for gynecological examination (general) (routine) without abnormal findings: Secondary | ICD-10-CM

## 2018-01-18 DIAGNOSIS — F5101 Primary insomnia: Secondary | ICD-10-CM

## 2018-01-18 DIAGNOSIS — F3342 Major depressive disorder, recurrent, in full remission: Secondary | ICD-10-CM

## 2018-01-18 DIAGNOSIS — N3000 Acute cystitis without hematuria: Secondary | ICD-10-CM

## 2018-01-18 DIAGNOSIS — Z6827 Body mass index (BMI) 27.0-27.9, adult: Secondary | ICD-10-CM

## 2018-01-18 LAB — MICROSCOPIC EXAMINATION: RENAL EPITHEL UA: NONE SEEN /HPF

## 2018-01-18 LAB — URINALYSIS, COMPLETE
BILIRUBIN UA: NEGATIVE
Glucose, UA: NEGATIVE
KETONES UA: NEGATIVE
Nitrite, UA: POSITIVE — AB
PH UA: 7 (ref 5.0–7.5)
PROTEIN UA: NEGATIVE
SPEC GRAV UA: 1.01 (ref 1.005–1.030)
UUROB: 0.2 mg/dL (ref 0.2–1.0)

## 2018-01-18 MED ORDER — AMITRIPTYLINE HCL 50 MG PO TABS: 50.0000 mg | ORAL_TABLET | Freq: Every day | ORAL | 1 refills | Status: DC

## 2018-01-18 MED ORDER — LOSARTAN POTASSIUM-HCTZ 100-12.5 MG PO TABS: 1.0000 | ORAL_TABLET | Freq: Every day | ORAL | 1 refills | Status: DC

## 2018-01-18 MED ORDER — HYDROCODONE-ACETAMINOPHEN 5-325 MG PO TABS: 1.0000 | ORAL_TABLET | Freq: Four times a day (QID) | ORAL | 0 refills | Status: DC | PRN

## 2018-01-18 MED ORDER — CIPROFLOXACIN HCL 500 MG PO TABS: 500.0000 mg | ORAL_TABLET | Freq: Two times a day (BID) | ORAL | 0 refills | Status: DC

## 2018-01-18 MED ORDER — METOPROLOL SUCCINATE ER 100 MG PO TB24: 100.0000 mg | ORAL_TABLET | Freq: Every day | ORAL | 1 refills | Status: DC

## 2018-01-18 MED ORDER — PRAVASTATIN SODIUM 40 MG PO TABS: 40.0000 mg | ORAL_TABLET | Freq: Every day | ORAL | 1 refills | Status: DC

## 2018-01-18 MED ORDER — SYNTHROID 88 MCG PO TABS: 88.0000 ug | ORAL_TABLET | Freq: Every day | ORAL | 1 refills | Status: DC

## 2018-01-18 NOTE — Addendum Note (Signed)
Addended by: Chevis Pretty on: 01/18/2018 04:11 PM   Modules accepted: Orders

## 2018-01-18 NOTE — Progress Notes (Addendum)
Subjective:    Patient ID: Jasmine White, female    DOB: Jun 08, 1946, 72 y.o.   MRN: [id number]  HPI  [facility] is here today for annual physical exam, PAP and follow up of chronic medical problem.  Outpatient Encounter Medications as of 01/18/2018  Medication Sig  . amitriptyline (ELAVIL) 50 MG tablet Take 1 tablet (50 mg total) by mouth at bedtime.  . Coenzyme Q10 (CO Q-10) 200 MG CAPS Take 1 capsule by mouth daily.  . Garlic 4920 MG CAPS Take 1 capsule by mouth daily. Take 1000 mg daily  . HYDROcodone-acetaminophen (NORCO/VICODIN) 5-325 MG tablet Take 1 tablet by mouth every 6 (six) hours as needed for moderate pain.  Marland Kitchen losartan-hydrochlorothiazide (HYZAAR) 100-12.5 MG tablet Take 1 tablet by mouth daily.  . Lutein 20 MG CAPS Take 1 capsule by mouth daily.  . metoprolol succinate (TOPROL-XL) 100 MG 24 hr tablet Take 1 tablet (100 mg total) by mouth daily. Take with or immediately following a meal.  . Omega-3 Fatty Acids (FISH OIL) 1200 MG CAPS Take by mouth.  . pravastatin (PRAVACHOL) 40 MG tablet Take 1 tablet (40 mg total) by mouth daily.  . Saline (SIMPLY SALINE) 0.9 % AERS Place 1 spray into the nose daily as needed. For sinuses  . SYNTHROID 88 MCG tablet Take 1 tablet (88 mcg total) by mouth daily before breakfast.     1. Annual physical exam   2. Essential hypertension, benign  No c/o chest pain, sob or headache. Does not check blood pressure at home. BP Readings from Last 3 Encounters:  07/21/17 129/78  07/17/17 127/77  01/09/17 (!) 148/84     3. Hypothyroidism due to acquired atrophy of thyroid  No problems that she is aware of.  4. Hyperlipidemia with target LDL less than 130  Watches diet and exercises 2-3 x a week  5. Recurrent major depressive disorder, in full remission ([place])  Says that she is doing well. Is currently on no medication. Depression screen [facility] 2/9 01/18/2018 07/21/2017 07/17/2017  Decreased Interest 0 0 0  Down, Depressed, Hopeless 0 0 0    PHQ - 2 Score 0 0 0  Altered sleeping - - -  Tired, decreased energy - - -  Change in appetite - - -  Feeling bad or failure about yourself  - - -  Trouble concentrating - - -  Moving slowly or fidgety/restless - - -  Suicidal thoughts - - -  PHQ-9 Score - - -     6. Chronic midline low back pain without sciatica  Takes an occasional. Takes hydrocodone occasionally.  7. BMI 27.0-27.9,adult  No recent weight chnages    New complaints: None today  Social history: Is retired. Takes care of her grand babies alot    Review of Systems  Constitutional: Negative for activity change and appetite change.  HENT: Negative.   Eyes: Negative for pain.  Respiratory: Negative for shortness of breath.   Cardiovascular: Negative for chest pain, palpitations and leg swelling.  Gastrointestinal: Negative for abdominal pain.  Endocrine: Negative for polydipsia.  Genitourinary: Negative.   Skin: Negative for rash.  Neurological: Negative for dizziness, weakness and headaches.  Hematological: Does not bruise/bleed easily.  Psychiatric/Behavioral: Negative.   All other systems reviewed and are negative.      Objective:   Physical Exam  Constitutional: She is oriented to person, place, and time.  HENT:  Head: Normocephalic.  Nose: Nose normal.  Mouth/Throat: Oropharynx is clear and  moist.  Eyes: Pupils are equal, White, and reactive to light. EOM are normal.  Neck: Normal range of motion. Neck supple. No JVD present. Carotid bruit is not present.  Cardiovascular: Normal rate, regular rhythm, normal heart sounds and intact distal pulses.  Pulmonary/Chest: Effort normal and breath sounds normal. No respiratory distress. She has no wheezes. She has no rales. She exhibits no tenderness.  Abdominal: Soft. Normal appearance, normal aorta and bowel sounds are normal. She exhibits no distension, no abdominal bruit, no pulsatile midline mass and no mass. There is no splenomegaly or hepatomegaly.  There is no tenderness.  Genitourinary: No vaginal discharge found.  Genitourinary Comments: Vaginal cuff intact No palpable adnexal tenderness or masses  Musculoskeletal: Normal range of motion. She exhibits no edema.  Lymphadenopathy:    She has no cervical adenopathy.  Neurological: She is alert and oriented to person, place, and time. She has normal reflexes.  Skin: Skin is warm and dry.  Psychiatric: Judgment normal.   BP 133/82   Pulse 75   Temp (!) 97.3 F (36.3 C) (Oral)   Ht '5\' 6"'  (1.676 m)   Wt 165 lb (74.8 kg)   BMI 26.63 kg/m       Assessment & Plan:  1. Annual physical exam - Urinalysis, Complete - CBC with Differential/Platelet  2. Essential hypertension, benign Low sodium diet - CMP14+EGFR - metoprolol succinate (TOPROL-XL) 100 MG 24 hr tablet; Take 1 tablet (100 mg total) by mouth daily. Take with or immediately following a meal.  Dispense: 90 tablet; Refill: 1 - losartan-hydrochlorothiazide (HYZAAR) 100-12.5 MG tablet; Take 1 tablet by mouth daily.  Dispense: 90 tablet; Refill: 1  3. Hypothyroidism due to acquired atrophy of thyroid - Thyroid Panel With TSH - SYNTHROID 88 MCG tablet; Take 1 tablet (88 mcg total) by mouth daily before breakfast.  Dispense: 90 tablet; Refill: 1  4. Hyperlipidemia with target LDL less than 130 Low fat diet - Lipid panel - pravastatin (PRAVACHOL) 40 MG tablet; Take 1 tablet (40 mg total) by mouth daily.  Dispense: 90 tablet; Refill: 1  5. Recurrent major depressive disorder, in full remission [facility]) Stress management  6. Chronic midline low back pain without sciatica Back stretches  7. BMI 27.0-27.9,adult Discussed diet and exercise for person with BMI >25 Will recheck weight in 3-6 months  8. Gynecologic exam normal - Pap IG (Image Guided)  9. Primary insomnia Bedtime routine - amitriptyline (ELAVIL) 50 MG tablet; Take 1 tablet (50 mg total) by mouth at bedtime.  Dispense: 90 tablet; Refill: 1  10. UTI -cipro  523m 1 po BID  Labs pending Health maintenance reviewed Diet and exercise encouraged Continue all meds Follow up  In 6 months   MGoodrich FNP

## 2018-01-18 NOTE — Patient Instructions (Signed)

## 2018-01-19 LAB — LIPID PANEL
CHOLESTEROL TOTAL: 216 mg/dL — AB (ref 100–199)
Chol/HDL Ratio: 3.3 ratio (ref 0.0–4.4)
HDL: 65 mg/dL (ref >39)
LDL Calculated: 131 mg/dL — ABNORMAL HIGH (ref 0–99)
TRIGLYCERIDES: 101 mg/dL (ref 0–149)
VLDL CHOLESTEROL CAL: 20 mg/dL (ref 5–40)

## 2018-01-19 LAB — CMP14+EGFR
A/G RATIO: 1.7 (ref 1.2–2.2)
ALT: 15 IU/L (ref 0–32)
AST: 33 IU/L (ref 0–40)
Albumin: 4.7 g/dL (ref 3.5–4.8)
Alkaline Phosphatase: 90 IU/L (ref 39–117)
BUN/Creatinine Ratio: 17 (ref 12–28)
BUN: 13 mg/dL (ref 8–27)
Bilirubin Total: 0.5 mg/dL (ref 0.0–1.2)
CHLORIDE: 94 mmol/L — AB (ref 96–106)
CO2: 26 mmol/L (ref 20–29)
Calcium: 9.9 mg/dL (ref 8.7–10.3)
Creatinine, Ser: 0.76 mg/dL (ref 0.57–1.00)
GFR calc non Af Amer: 79 mL/min/{1.73_m2} (ref >59)
GFR, EST AFRICAN AMERICAN: 91 mL/min/{1.73_m2} (ref >59)
GLUCOSE: 104 mg/dL — AB (ref 65–99)
Globulin, Total: 2.8 g/dL (ref 1.5–4.5)
POTASSIUM: 4.2 mmol/L (ref 3.5–5.2)
Sodium: 135 mmol/L (ref 134–144)
TOTAL PROTEIN: 7.5 g/dL (ref 6.0–8.5)

## 2018-01-19 LAB — THYROID PANEL WITH TSH
Free Thyroxine Index: 2.6 (ref 1.2–4.9)
T3 Uptake Ratio: 27 % (ref 24–39)
T4 TOTAL: 9.7 ug/dL (ref 4.5–12.0)
TSH: 0.859 u[IU]/mL (ref 0.450–4.500)

## 2018-01-19 LAB — CBC WITH DIFFERENTIAL/PLATELET
BASOS ABS: 0.1 10*3/uL (ref 0.0–0.2)
Basos: 1 %
EOS (ABSOLUTE): 0.1 10*3/uL (ref 0.0–0.4)
Eos: 1 %
Hematocrit: 42.4 % (ref 34.0–46.6)
Hemoglobin: 14.1 g/dL (ref 11.1–15.9)
IMMATURE GRANS (ABS): 0 10*3/uL (ref 0.0–0.1)
IMMATURE GRANULOCYTES: 0 %
LYMPHS: 22 %
Lymphocytes Absolute: 1.8 10*3/uL (ref 0.7–3.1)
MCH: 28.9 pg (ref 26.6–33.0)
MCHC: 33.3 g/dL (ref 31.5–35.7)
MCV: 87 fL (ref 79–97)
MONOCYTES: 7 %
Monocytes Absolute: 0.6 10*3/uL (ref 0.1–0.9)
NEUTROS ABS: 5.6 10*3/uL (ref 1.4–7.0)
NEUTROS PCT: 69 %
PLATELETS: 295 10*3/uL (ref 150–379)
RBC: 4.88 x10E6/uL (ref 3.77–5.28)
RDW: 13.9 % (ref 12.3–15.4)
WBC: 8.1 10*3/uL (ref 3.4–10.8)

## 2018-01-19 LAB — PAP IG (IMAGE GUIDED): PAP Smear Comment: 0

## 2018-01-20 LAB — URINE CULTURE

## 2018-05-03 DIAGNOSIS — D0462 Carcinoma in situ of skin of left upper limb, including shoulder: Secondary | ICD-10-CM | POA: Diagnosis not present

## 2018-05-03 DIAGNOSIS — D485 Neoplasm of uncertain behavior of skin: Secondary | ICD-10-CM | POA: Diagnosis not present

## 2018-05-03 DIAGNOSIS — L821 Other seborrheic keratosis: Secondary | ICD-10-CM | POA: Diagnosis not present

## 2018-05-03 DIAGNOSIS — Z85828 Personal history of other malignant neoplasm of skin: Secondary | ICD-10-CM | POA: Diagnosis not present

## 2018-05-03 DIAGNOSIS — D225 Melanocytic nevi of trunk: Secondary | ICD-10-CM | POA: Diagnosis not present

## 2018-05-03 DIAGNOSIS — L720 Epidermal cyst: Secondary | ICD-10-CM | POA: Diagnosis not present

## 2018-05-03 DIAGNOSIS — D2239 Melanocytic nevi of other parts of face: Secondary | ICD-10-CM | POA: Diagnosis not present

## 2018-05-03 DIAGNOSIS — H61002 Unspecified perichondritis of left external ear: Secondary | ICD-10-CM | POA: Diagnosis not present

## 2018-05-03 DIAGNOSIS — L570 Actinic keratosis: Secondary | ICD-10-CM | POA: Diagnosis not present

## 2018-05-26 DIAGNOSIS — L720 Epidermal cyst: Secondary | ICD-10-CM | POA: Diagnosis not present

## 2018-05-26 DIAGNOSIS — Z85828 Personal history of other malignant neoplasm of skin: Secondary | ICD-10-CM | POA: Diagnosis not present

## 2018-06-14 DIAGNOSIS — Z85828 Personal history of other malignant neoplasm of skin: Secondary | ICD-10-CM | POA: Diagnosis not present

## 2018-06-14 DIAGNOSIS — L720 Epidermal cyst: Secondary | ICD-10-CM | POA: Diagnosis not present

## 2018-06-30 DIAGNOSIS — Z23 Encounter for immunization: Secondary | ICD-10-CM | POA: Diagnosis not present

## 2018-07-20 ENCOUNTER — Ambulatory Visit: Payer: Medicare PPO | Admitting: Nurse Practitioner

## 2018-07-20 ENCOUNTER — Encounter: Payer: Self-pay | Admitting: Nurse Practitioner

## 2018-07-20 VITALS — BP 147/80 | HR 75 | Temp 97.3°F | Ht 66.0 in | Wt 165.0 lb

## 2018-07-20 DIAGNOSIS — Z6827 Body mass index (BMI) 27.0-27.9, adult: Secondary | ICD-10-CM

## 2018-07-20 DIAGNOSIS — G8929 Other chronic pain: Secondary | ICD-10-CM

## 2018-07-20 DIAGNOSIS — F5101 Primary insomnia: Secondary | ICD-10-CM | POA: Diagnosis not present

## 2018-07-20 DIAGNOSIS — F3342 Major depressive disorder, recurrent, in full remission: Secondary | ICD-10-CM

## 2018-07-20 DIAGNOSIS — E034 Atrophy of thyroid (acquired): Secondary | ICD-10-CM

## 2018-07-20 DIAGNOSIS — M545 Low back pain, unspecified: Secondary | ICD-10-CM

## 2018-07-20 DIAGNOSIS — I10 Essential (primary) hypertension: Secondary | ICD-10-CM | POA: Diagnosis not present

## 2018-07-20 DIAGNOSIS — E785 Hyperlipidemia, unspecified: Secondary | ICD-10-CM

## 2018-07-20 MED ORDER — AMITRIPTYLINE HCL 50 MG PO TABS: 50.0000 mg | ORAL_TABLET | Freq: Every day | ORAL | 1 refills | Status: DC

## 2018-07-20 MED ORDER — LOSARTAN POTASSIUM-HCTZ 100-12.5 MG PO TABS: 1.0000 | ORAL_TABLET | Freq: Every day | ORAL | 1 refills | Status: DC

## 2018-07-20 MED ORDER — HYDROCODONE-ACETAMINOPHEN 5-325 MG PO TABS: 1.0000 | ORAL_TABLET | Freq: Four times a day (QID) | ORAL | 0 refills | Status: DC | PRN

## 2018-07-20 MED ORDER — METOPROLOL SUCCINATE ER 100 MG PO TB24: 100.0000 mg | ORAL_TABLET | Freq: Every day | ORAL | 1 refills | Status: DC

## 2018-07-20 MED ORDER — PRAVASTATIN SODIUM 40 MG PO TABS: 40.0000 mg | ORAL_TABLET | Freq: Every day | ORAL | 1 refills | Status: DC

## 2018-07-20 MED ORDER — SYNTHROID 88 MCG PO TABS: 88.0000 ug | ORAL_TABLET | Freq: Every day | ORAL | 1 refills | Status: DC

## 2018-07-20 NOTE — Progress Notes (Addendum)
Subjective:    Patient ID: [facility], female    DOB: April 14, 1946, 72 y.o.   MRN: [id number]   Chief Complaint: Medical management of chronic issues  HPI:  1. Essential hypertension, benign  -does not check BP at home -does not watch salt intake in diet -no c/o HA/CP/SOB BP Readings from Last 3 Encounters:  07/20/18 (!) 147/80  01/18/18 133/82  07/21/17 129/78     2. Hypothyroidism due to acquired atrophy of thyroid  -no issues she is aware of  3. Recurrent major depressive disorder, in full remission ([place])  -feels her depressive symptoms are controlled, not currently taking medication -Elavil initially for sleep/back pain and has helped her depression   Office Visit from 12/30/2013 in [place]  PHQ-9 Total Score  2      4. Hyperlipidemia with target LDL less than 130  -tries to watch her fat intake in diet -every once in awhile has myalgias in legs  5. Chronic midline low back pain without sciatica  -pain rating is 5/10 at worst -worse w/ activity and traveling in car -last Norco Rx filled 01/18/18 -pain is daily but only takes pain meds when it is unbearable  6. BMI 27.0-27.9,adult  -does exercise frequently (Zumba, elliptical and weights) -no recent weight changes, but down 5lbs from last visit in 06/2017    Outpatient Encounter Medications as of 07/20/2018  Medication Sig  . amitriptyline (ELAVIL) 50 MG tablet Take 1 tablet (50 mg total) by mouth at bedtime.  . ciprofloxacin (CIPRO) 500 MG tablet Take 1 tablet (500 mg total) by mouth 2 (two) times daily.  . Coenzyme Q10 (CO Q-10) 200 MG CAPS Take 1 capsule by mouth daily.  . Garlic 2876 MG CAPS Take 1 capsule by mouth daily. Take 1000 mg daily  . HYDROcodone-acetaminophen (NORCO/VICODIN) 5-325 MG tablet Take 1 tablet by mouth every 6 (six) hours as needed for moderate pain.  Marland Kitchen losartan-hydrochlorothiazide (HYZAAR) 100-12.5 MG tablet Take 1 tablet by mouth daily.  . Lutein 20 MG CAPS  Take 1 capsule by mouth daily.  . metoprolol succinate (TOPROL-XL) 100 MG 24 hr tablet Take 1 tablet (100 mg total) by mouth daily. Take with or immediately following a meal.  . Omega-3 Fatty Acids (FISH OIL) 1200 MG CAPS Take by mouth.  . pravastatin (PRAVACHOL) 40 MG tablet Take 1 tablet (40 mg total) by mouth daily.  . Saline (SIMPLY SALINE) 0.9 % AERS Place 1 spray into the nose daily as needed. For sinuses  . SYNTHROID 88 MCG tablet Take 1 tablet (88 mcg total) by mouth daily before breakfast.   No facility-administered encounter medications on file as of 07/20/2018.      New complaints: None today, had a recent cyst rupture and excision on left neck by dermatologist-no issues since  Social history: Retired from [employer], married (husband #3), children (2), 4 grandkids   Review of Systems  Constitutional: Negative for activity change, appetite change, chills, fatigue, fever and unexpected weight change.  HENT: Negative for congestion, ear pain, rhinorrhea, sinus pressure, sinus pain and sore throat.   Eyes: Negative for pain, redness and visual disturbance.  Respiratory: Negative for cough, chest tightness, shortness of breath and wheezing.   Cardiovascular: Negative for chest pain, palpitations and leg swelling.  Gastrointestinal: Negative for abdominal pain, constipation, diarrhea, nausea and vomiting.  Endocrine: Negative for cold intolerance, heat intolerance, polydipsia, polyphagia and polyuria.  Genitourinary: Negative for difficulty urinating, dysuria and urgency.  Musculoskeletal:  Negative for arthralgias, gait problem, joint swelling and myalgias.  Skin: Negative for rash and wound.  Allergic/Immunologic: Negative for environmental allergies and food allergies.  Neurological: Negative for dizziness, tremors, weakness and numbness.  Hematological: Does not bruise/bleed easily.  Psychiatric/Behavioral: Negative for behavioral problems, confusion, decreased  concentration, sleep disturbance and suicidal ideas. The patient is not nervous/anxious.        Objective:   Physical Exam  Constitutional: She is oriented to person, place, and time. She appears well-developed and well-nourished.  HENT:  Head: Normocephalic and atraumatic.  Right Ear: External ear normal.  Left Ear: External ear normal.  Nose: Nose normal.  Mouth/Throat: Oropharynx is clear and moist. No oropharyngeal exudate.  Eyes: Pupils are equal, White, and reactive to light. Conjunctivae and EOM are normal.  Neck: Normal range of motion. Neck supple. No thyromegaly present.  Cardiovascular: Normal rate, regular rhythm, normal heart sounds and intact distal pulses.  Pulmonary/Chest: Effort normal and breath sounds normal.  Abdominal: Soft. Bowel sounds are normal.  Musculoskeletal: Normal range of motion.  Neurological: She is alert and oriented to person, place, and time. She displays normal reflexes. No cranial nerve deficit.  Skin: Skin is warm and dry.  Psychiatric: She has a normal mood and affect. Her behavior is normal. Judgment and thought content normal.  Nursing note and vitals reviewed.    BP (!) 147/80   Pulse 75   Temp (!) 97.3 F (36.3 C) (Oral)   Ht '5\' 6"'  (1.676 m)   Wt 165 lb (74.8 kg)   BMI 26.63 kg/m      Assessment & Plan:  [facility] comes in today with chief complaint of Medical Management of Chronic Issues   Diagnosis and orders addressed:  1. Essential hypertension, benign Low sodium diet - CMP14+EGFR - metoprolol succinate (TOPROL-XL) 100 MG 24 hr tablet; Take 1 tablet (100 mg total) by mouth daily. Take with or immediately following a meal.  Dispense: 90 tablet; Refill: 1 - losartan-hydrochlorothiazide (HYZAAR) 100-12.5 MG tablet; Take 1 tablet by mouth daily.  Dispense: 90 tablet; Refill: 1  2. Hypothyroidism due to acquired atrophy of thyroid - Thyroid Panel With TSH - SYNTHROID 88 MCG tablet; Take 1 tablet (88 mcg total) by  mouth daily before breakfast.  Dispense: 90 tablet; Refill: 1  3. Recurrent major depressive disorder, in full remission ([place]) Stress management  4. Hyperlipidemia with target LDL less than 130 Low fat diet - Lipid panel - pravastatin (PRAVACHOL) 40 MG tablet; Take 1 tablet (40 mg total) by mouth daily.  Dispense: 90 tablet; Refill: 1  5. Chronic midline low back pain without sciatica Back stretches - HYDROcodone-acetaminophen (NORCO/VICODIN) 5-325 MG tablet; Take 1 tablet by mouth every 6 (six) hours as needed for moderate pain.  Dispense: 40 tablet; Refill: 0  6. BMI 27.0-27.9,adult Discussed diet and exercise for person with BMI >25 Will recheck weight in 3-6 months  7. Primary insomnia Bedtime routine - amitriptyline (ELAVIL) 50 MG tablet; Take 1 tablet (50 mg total) by mouth at bedtime.  Dispense: 90 tablet; Refill: 1   Labs pending Health Maintenance reviewed Diet and exercise encouraged  Follow up plan: 6 months   [name] Hassell Done, FNP

## 2018-07-20 NOTE — Patient Instructions (Signed)

## 2018-07-20 NOTE — Addendum Note (Signed)
Addended by: Chevis Pretty on: 07/20/2018 08:44 AM   Modules accepted: Orders

## 2018-07-21 LAB — CMP14+EGFR
A/G RATIO: 1.5 (ref 1.2–2.2)
ALK PHOS: 92 IU/L (ref 39–117)
ALT: 17 IU/L (ref 0–32)
AST: 35 IU/L (ref 0–40)
Albumin: 4.5 g/dL (ref 3.5–4.8)
BILIRUBIN TOTAL: 0.5 mg/dL (ref 0.0–1.2)
BUN/Creatinine Ratio: 17 (ref 12–28)
BUN: 14 mg/dL (ref 8–27)
CHLORIDE: 95 mmol/L — AB (ref 96–106)
CO2: 25 mmol/L (ref 20–29)
Calcium: 10 mg/dL (ref 8.7–10.3)
Creatinine, Ser: 0.82 mg/dL (ref 0.57–1.00)
GFR calc Af Amer: 82 mL/min/{1.73_m2} (ref >59)
GFR calc non Af Amer: 71 mL/min/{1.73_m2} (ref >59)
GLUCOSE: 96 mg/dL (ref 65–99)
Globulin, Total: 3 g/dL (ref 1.5–4.5)
POTASSIUM: 4.1 mmol/L (ref 3.5–5.2)
Sodium: 138 mmol/L (ref 134–144)
Total Protein: 7.5 g/dL (ref 6.0–8.5)

## 2018-07-21 LAB — LIPID PANEL
CHOLESTEROL TOTAL: 251 mg/dL — AB (ref 100–199)
Chol/HDL Ratio: 3.9 ratio (ref 0.0–4.4)
HDL: 65 mg/dL (ref >39)
LDL Calculated: 157 mg/dL — ABNORMAL HIGH (ref 0–99)
TRIGLYCERIDES: 147 mg/dL (ref 0–149)
VLDL CHOLESTEROL CAL: 29 mg/dL (ref 5–40)

## 2018-07-21 LAB — THYROID PANEL WITH TSH
Free Thyroxine Index: 2.5 (ref 1.2–4.9)
T3 Uptake Ratio: 27 % (ref 24–39)
T4, Total: 9.3 ug/dL (ref 4.5–12.0)
TSH: 1.04 u[IU]/mL (ref 0.450–4.500)

## 2018-07-22 DIAGNOSIS — H2513 Age-related nuclear cataract, bilateral: Secondary | ICD-10-CM | POA: Diagnosis not present

## 2018-07-22 DIAGNOSIS — H40033 Anatomical narrow angle, bilateral: Secondary | ICD-10-CM | POA: Diagnosis not present

## 2018-07-22 MED ORDER — SIMVASTATIN 40 MG PO TABS: 40.0000 mg | ORAL_TABLET | Freq: Every day | ORAL | 1 refills | Status: DC

## 2018-07-22 NOTE — Addendum Note (Signed)
Addended by: Chevis Pretty on: 07/22/2018 01:15 PM   Modules accepted: Orders

## 2018-11-03 DIAGNOSIS — L821 Other seborrheic keratosis: Secondary | ICD-10-CM | POA: Diagnosis not present

## 2018-11-03 DIAGNOSIS — Z85828 Personal history of other malignant neoplasm of skin: Secondary | ICD-10-CM | POA: Diagnosis not present

## 2018-11-03 DIAGNOSIS — L720 Epidermal cyst: Secondary | ICD-10-CM | POA: Diagnosis not present

## 2018-11-03 DIAGNOSIS — D225 Melanocytic nevi of trunk: Secondary | ICD-10-CM | POA: Diagnosis not present

## 2018-11-03 DIAGNOSIS — L570 Actinic keratosis: Secondary | ICD-10-CM | POA: Diagnosis not present

## 2019-01-12 ENCOUNTER — Other Ambulatory Visit: Payer: Self-pay | Admitting: Nurse Practitioner

## 2019-01-12 DIAGNOSIS — E034 Atrophy of thyroid (acquired): Secondary | ICD-10-CM

## 2019-01-20 ENCOUNTER — Encounter: Payer: Self-pay | Admitting: Nurse Practitioner

## 2019-01-20 ENCOUNTER — Ambulatory Visit (INDEPENDENT_AMBULATORY_CARE_PROVIDER_SITE_OTHER): Payer: Medicare PPO | Admitting: Nurse Practitioner

## 2019-01-20 ENCOUNTER — Other Ambulatory Visit: Payer: Self-pay

## 2019-01-20 DIAGNOSIS — E034 Atrophy of thyroid (acquired): Secondary | ICD-10-CM | POA: Diagnosis not present

## 2019-01-20 DIAGNOSIS — E785 Hyperlipidemia, unspecified: Secondary | ICD-10-CM

## 2019-01-20 DIAGNOSIS — F5101 Primary insomnia: Secondary | ICD-10-CM | POA: Insufficient documentation

## 2019-01-20 DIAGNOSIS — M545 Low back pain, unspecified: Secondary | ICD-10-CM

## 2019-01-20 DIAGNOSIS — Z6827 Body mass index (BMI) 27.0-27.9, adult: Secondary | ICD-10-CM | POA: Diagnosis not present

## 2019-01-20 DIAGNOSIS — I10 Essential (primary) hypertension: Secondary | ICD-10-CM | POA: Diagnosis not present

## 2019-01-20 DIAGNOSIS — F3342 Major depressive disorder, recurrent, in full remission: Secondary | ICD-10-CM | POA: Diagnosis not present

## 2019-01-20 DIAGNOSIS — G8929 Other chronic pain: Secondary | ICD-10-CM

## 2019-01-20 MED ORDER — HYDROCODONE-ACETAMINOPHEN 5-325 MG PO TABS: 1.0000 | ORAL_TABLET | Freq: Four times a day (QID) | ORAL | 0 refills | Status: DC | PRN

## 2019-01-20 MED ORDER — LOSARTAN POTASSIUM-HCTZ 100-12.5 MG PO TABS: 1.0000 | ORAL_TABLET | Freq: Every day | ORAL | 1 refills | Status: DC

## 2019-01-20 MED ORDER — SYNTHROID 88 MCG PO TABS: ORAL_TABLET | ORAL | 1 refills | Status: DC

## 2019-01-20 MED ORDER — METOPROLOL SUCCINATE ER 100 MG PO TB24: 100.0000 mg | ORAL_TABLET | Freq: Every day | ORAL | 1 refills | Status: DC

## 2019-01-20 MED ORDER — PRAVASTATIN SODIUM 40 MG PO TABS: 40.0000 mg | ORAL_TABLET | Freq: Every day | ORAL | 1 refills | Status: DC

## 2019-01-20 MED ORDER — SIMVASTATIN 40 MG PO TABS: 40.0000 mg | ORAL_TABLET | Freq: Every day | ORAL | 1 refills | Status: DC

## 2019-01-20 MED ORDER — AMITRIPTYLINE HCL 50 MG PO TABS: 50.0000 mg | ORAL_TABLET | Freq: Every day | ORAL | 1 refills | Status: DC

## 2019-01-20 NOTE — Progress Notes (Signed)
Patient ID: Jasmine White, female   DOB: 08-20-1946, 73 y.o.   MRN: [id number]     Virtual Visit via telephone Note  I connected with Jasmine White on 01/20/19 at 9:25 AM by telephone and verified that I am speaking with the correct person using two identifiers. Jasmine White is currently located at home and her husband is currently with her during visit. The provider, [name] Hassell Done, FNP is located in their office at time of visit.  I discussed the limitations, risks, security and privacy concerns of performing an evaluation and management service by telephone and the availability of in person appointments. I also discussed with the patient that there may be a patient responsible charge related to this service. The patient expressed understanding and agreed to proceed.   History and Present Illness:   Chief Complaint: medical management of chronic issues   HPI:  1. Essential hypertension, benign No c/o chest pain, sob or headache. Does not check blood pressure at home. BP Readings from Last 3 Encounters:  07/20/18 (!) 147/80  01/18/18 133/82  07/21/17 129/78     2. Hyperlipidemia with target LDL less than 130 Watches diet and exercises.  3. Hypothyroidism due to acquired atrophy of thyroid No problems that she is aware of.  4. Recurrent major depressive disorder, in full remission [facility]) Says she is doing okay. When she gets down she just goes outside and walks around.  5. BMI 27.0-27.9,adult No recent weight changes  6. insomnia Takes elavil and works well  Outpatient Encounter Medications as of 01/20/2019  Medication Sig  . amitriptyline (ELAVIL) 50 MG tablet Take 1 tablet (50 mg total) by mouth at bedtime.  . Coenzyme Q10 (CO Q-10) 200 MG CAPS Take 1 capsule by mouth daily.  . Garlic 8466 MG CAPS Take 1 capsule by mouth daily. Take 1000 mg daily  . HYDROcodone-acetaminophen (NORCO/VICODIN) 5-325 MG tablet Take 1 tablet by mouth every 6 (six) hours as needed  for moderate pain.  Marland Kitchen losartan-hydrochlorothiazide (HYZAAR) 100-12.5 MG tablet Take 1 tablet by mouth daily.  . Lutein 20 MG CAPS Take 1 capsule by mouth daily.  . metoprolol succinate (TOPROL-XL) 100 MG 24 hr tablet Take 1 tablet (100 mg total) by mouth daily. Take with or immediately following a meal.  . Omega-3 Fatty Acids (FISH OIL) 1200 MG CAPS Take by mouth.  . pravastatin (PRAVACHOL) 40 MG tablet Take 1 tablet (40 mg total) by mouth daily.  . Saline (SIMPLY SALINE) 0.9 % AERS Place 1 spray into the nose daily as needed. For sinuses  . simvastatin (ZOCOR) 40 MG tablet Take 1 tablet (40 mg total) by mouth at bedtime.  Marland Kitchen SYNTHROID 88 MCG tablet TAKE 1 TABLET BY MOUTH ONCE DAILY BEFORE BREAKFAST      New complaints: None today  Social history: Lives with husband- she has not been out to town in over a month.       Review of Systems  Constitutional: Negative for diaphoresis and weight loss.  Eyes: Negative for blurred vision, double vision and pain.  Respiratory: Negative for shortness of breath.   Cardiovascular: Negative for chest pain, palpitations, orthopnea and leg swelling.  Gastrointestinal: Negative for abdominal pain.  Skin: Negative for rash.  Neurological: Negative for dizziness, sensory change, loss of consciousness, weakness and headaches.  Endo/Heme/Allergies: Negative for polydipsia. Does not bruise/bleed easily.  Psychiatric/Behavioral: Negative for memory loss. The patient does not have insomnia.   All other systems reviewed and are negative.  Observations/Objective: Alert and oriented No distress noted  Assessment and Plan: Jasmine White comes in today with chief complaint of Medical Management of Chronic Issues   Diagnosis and orders addressed:  1. Essential hypertension, benign Low sodium diet - metoprolol succinate (TOPROL-XL) 100 MG 24 hr tablet; Take 1 tablet (100 mg total) by mouth daily. Take with or immediately following a meal.   Dispense: 90 tablet; Refill: 1 - losartan-hydrochlorothiazide (HYZAAR) 100-12.5 MG tablet; Take 1 tablet by mouth daily.  Dispense: 90 tablet; Refill: 1  2. Hyperlipidemia with target LDL less than 130 Low fat diet - simvastatin (ZOCOR) 40 MG tablet; Take 1 tablet (40 mg total) by mouth at bedtime.  Dispense: 90 tablet; Refill: 1 - pravastatin (PRAVACHOL) 40 MG tablet; Take 1 tablet (40 mg total) by mouth daily.  Dispense: 90 tablet; Refill: 1  3. Hypothyroidism due to acquired atrophy of thyroid - SYNTHROID 88 MCG tablet; TAKE 1 TABLET BY MOUTH ONCE DAILY BEFORE BREAKFAST  Dispense: 90 tablet; Refill: 1  4. Recurrent major depressive disorder, in full remission Jasmine White) Stress management discussed  5. BMI 27.0-27.9,adult Discussed diet and exercise for person with BMI >25 Will recheck weight in 3-6 months  6. Chronic midline low back pain without sciatica - HYDROcodone-acetaminophen (NORCO/VICODIN) 5-325 MG tablet; Take 1 tablet by mouth every 6 (six) hours as needed for moderate pain.  Dispense: 40 tablet; Refill: 0  7. Primary insomnia Bedtime routine - amitriptyline (ELAVIL) 50 MG tablet; Take 1 tablet (50 mg total) by mouth at bedtime.  Dispense: 90 tablet; Refill: 1   Previous labs results reviewed Health Maintenance reviewed Diet and exercise encouraged  Follow up plan: Has physical scheduled for JUne     I discussed the assessment and treatment plan with the patient. The patient was provided an opportunity to ask questions and all were answered. The patient agreed with the plan and demonstrated an understanding of the instructions.   The patient was advised to call back or seek an in-person evaluation if the symptoms worsen or if the condition fails to improve as anticipated.  The above assessment and management plan was discussed with the patient. The patient verbalized understanding of and has agreed to the management plan. Patient is aware to call the clinic if symptoms  persist or worsen. Patient is aware when to return to the clinic for a follow-up visit. Patient educated on when it is appropriate to go to the emergency department.    I provided 15 minutes of non-face-to-face time during this encounter.    [name] Hassell Done, FNP

## 2019-01-31 ENCOUNTER — Telehealth: Payer: Self-pay | Admitting: Nurse Practitioner

## 2019-01-31 MED ORDER — ROSUVASTATIN CALCIUM 10 MG PO TABS: 10.0000 mg | ORAL_TABLET | Freq: Every day | ORAL | 1 refills | Status: DC

## 2019-01-31 NOTE — Telephone Encounter (Signed)
Per pt, she can not take Simvastatin or Pravastatin Simvastatin causes abd pain, Pravastatin causes muscle pain Crestor sent into [facility] per MMM Pt and pharmacy notified of change

## 2019-02-25 ENCOUNTER — Encounter: Payer: Medicare PPO | Admitting: Nurse Practitioner

## 2019-03-03 ENCOUNTER — Other Ambulatory Visit: Payer: Medicare PPO

## 2019-03-03 ENCOUNTER — Ambulatory Visit (INDEPENDENT_AMBULATORY_CARE_PROVIDER_SITE_OTHER): Payer: Medicare PPO | Admitting: Nurse Practitioner

## 2019-03-03 ENCOUNTER — Encounter: Payer: Self-pay | Admitting: Nurse Practitioner

## 2019-03-03 ENCOUNTER — Other Ambulatory Visit: Payer: Self-pay

## 2019-03-03 VITALS — BP 138/79 | HR 63 | Temp 96.9°F | Ht 66.0 in | Wt 149.0 lb

## 2019-03-03 DIAGNOSIS — F5101 Primary insomnia: Secondary | ICD-10-CM | POA: Diagnosis not present

## 2019-03-03 DIAGNOSIS — Z0001 Encounter for general adult medical examination with abnormal findings: Secondary | ICD-10-CM | POA: Diagnosis not present

## 2019-03-03 DIAGNOSIS — Z0000 Encounter for general adult medical examination without abnormal findings: Secondary | ICD-10-CM

## 2019-03-03 DIAGNOSIS — E785 Hyperlipidemia, unspecified: Secondary | ICD-10-CM

## 2019-03-03 DIAGNOSIS — E034 Atrophy of thyroid (acquired): Secondary | ICD-10-CM | POA: Diagnosis not present

## 2019-03-03 DIAGNOSIS — Z6827 Body mass index (BMI) 27.0-27.9, adult: Secondary | ICD-10-CM

## 2019-03-03 DIAGNOSIS — F3342 Major depressive disorder, recurrent, in full remission: Secondary | ICD-10-CM | POA: Diagnosis not present

## 2019-03-03 DIAGNOSIS — I10 Essential (primary) hypertension: Secondary | ICD-10-CM | POA: Diagnosis not present

## 2019-03-03 LAB — URINALYSIS, COMPLETE
Bilirubin, UA: NEGATIVE
Glucose, UA: NEGATIVE
Ketones, UA: NEGATIVE
Nitrite, UA: NEGATIVE
Protein,UA: NEGATIVE
Specific Gravity, UA: 1.015 (ref 1.005–1.030)
Urobilinogen, Ur: 0.2 mg/dL (ref 0.2–1.0)
pH, UA: 7.5 (ref 5.0–7.5)

## 2019-03-03 LAB — MICROSCOPIC EXAMINATION: Renal Epithel, UA: NONE SEEN /hpf

## 2019-03-03 LAB — LIPID PANEL

## 2019-03-03 NOTE — Progress Notes (Signed)
Subjective:    Patient ID: Jasmine White, female    DOB: [birth year], 73 y.o.   MRN: [id number]   Chief Complaint: Annual Exam    HPI:  1. Annual physical exam Last pap was 01/18/18 and was normal. Patient likes to have done yearly.  2. Essential hypertension, benign No c/o chest pain, sob or headache. Does not check blood pressure at home. BP Readings from Last 3 Encounters:  03/03/19 138/79  07/20/18 (!) 147/80  01/18/18 133/82     3. Hyperlipidemia with target LDL less than 130 Does watch diet and exercise.  4. Hypothyroidism due to acquired atrophy of thyroid No problems that aware of.  5. Recurrent major depressive disorder, in full remission ([facility]) Is on no depression meds. Says she is doing well. Depression screen [facility] 2/9 03/03/2019 01/20/2019 07/20/2018  Decreased Interest 0 0 0  Down, Depressed, Hopeless 0 0 0  PHQ - 2 Score 0 0 0  Altered sleeping - - -  Tired, decreased energy - - -  Change in appetite - - -  Feeling bad or failure about yourself  - - -  Trouble concentrating - - -  Moving slowly or fidgety/restless - - -  Suicidal thoughts - - -  PHQ-9 Score - - -     6. Primary insomnia Takes elavil to sleep at night and works well for her.  7. BMI 27.0-27.9,adult No recent weight changes    Outpatient Encounter Medications as of 03/03/2019  Medication Sig  . amitriptyline (ELAVIL) 50 MG tablet Take 1 tablet (50 mg total) by mouth at bedtime.  . Coenzyme Q10 (CO Q-10) 200 MG CAPS Take 1 capsule by mouth daily.  . Garlic 2376 MG CAPS Take 1 capsule by mouth daily. Take 1000 mg daily  . HYDROcodone-acetaminophen (NORCO/VICODIN) 5-325 MG tablet Take 1 tablet by mouth every 6 (six) hours as needed for moderate pain.  Marland Kitchen losartan-hydrochlorothiazide (HYZAAR) 100-12.5 MG tablet Take 1 tablet by mouth daily.  . Lutein 20 MG CAPS Take 1 capsule by mouth daily.  . metoprolol succinate (TOPROL-XL) 100 MG 24 hr tablet Take 1 tablet (100 mg total) by mouth  daily. Take with or immediately following a meal.  . Omega-3 Fatty Acids (FISH OIL) 1200 MG CAPS Take by mouth.  . pravastatin (PRAVACHOL) 40 MG tablet Take 1 tablet (40 mg total) by mouth daily.  . rosuvastatin (CRESTOR) 10 MG tablet Take 1 tablet (10 mg total) by mouth daily.  . Saline (SIMPLY SALINE) 0.9 % AERS Place 1 spray into the nose daily as needed. For sinuses  . simvastatin (ZOCOR) 40 MG tablet Take 1 tablet (40 mg total) by mouth at bedtime.  Marland Kitchen SYNTHROID 88 MCG tablet TAKE 1 TABLET BY MOUTH ONCE DAILY BEFORE BREAKFAST     Past Surgical History:  Procedure Laterality Date  . ABDOMINAL HYSTERECTOMY    . BACK SURGERY    . basal cell skin cancer     on face and forehead  . bil knee replacements    . BREAST BIOPSY     left breast/benign  . LUMBAR LAMINECTOMY/DECOMPRESSION MICRODISCECTOMY  08/13/2012   Procedure: LUMBAR LAMINECTOMY/DECOMPRESSION MICRODISCECTOMY 1 LEVEL;  Surgeon: Erline Levine, MD;  Location: [place] [facility];  Service: Neurosurgery;  Laterality: Left;  Left lumbar one-two Microdiskectomy    Family History  Problem Relation Age of Onset  . Hypertension Mother   . Heart disease Mother   . Stroke Father   . Heart disease Father   .  Alcohol abuse Father   . Arthritis Brother   . Cancer Brother 74       prostate  . Arthritis Brother     New complaints: None today  Social history: Helps take care of her mom and also takes care of her grandchildren.     Review of Systems  Constitutional: Negative for activity change and appetite change.  HENT: Negative.   Eyes: Negative for pain.  Respiratory: Negative for shortness of breath.   Cardiovascular: Negative for chest pain, palpitations and leg swelling.  Gastrointestinal: Negative for abdominal pain.  Endocrine: Negative for polydipsia.  Genitourinary: Negative.   Skin: Negative for rash.  Neurological: Negative for dizziness, weakness and headaches.  Hematological: Does not bruise/bleed easily.   Psychiatric/Behavioral: Negative.   All other systems reviewed and are negative.      Objective:   Physical Exam Vitals signs and nursing note reviewed.  Constitutional:      General: She is not in acute distress.    Appearance: Normal appearance. She is well-developed.  HENT:     Head: Normocephalic.     Nose: Nose normal.  Eyes:     Pupils: Pupils are equal, White, and reactive to light.  Neck:     Musculoskeletal: Normal range of motion and neck supple.     Vascular: No carotid bruit or JVD.  Cardiovascular:     Rate and Rhythm: Normal rate and regular rhythm.     Heart sounds: Normal heart sounds.  Pulmonary:     Effort: Pulmonary effort is normal. No respiratory distress.     Breath sounds: Normal breath sounds. No wheezing or rales.  Chest:     Chest wall: No tenderness.  Abdominal:     General: Bowel sounds are normal. There is no distension or abdominal bruit.     Palpations: Abdomen is soft. There is no hepatomegaly, splenomegaly, mass or pulsatile mass.     Tenderness: There is no abdominal tenderness.  Genitourinary:    General: Normal vulva.     Vagina: No vaginal discharge.     Rectum: Normal.     Comments: Vaginal cuff intact No adnexal mass or tenderness. Musculoskeletal: Normal range of motion.  Lymphadenopathy:     Cervical: No cervical adenopathy.  Skin:    General: Skin is warm and dry.  Neurological:     Mental Status: She is alert and oriented to person, place, and time.     Deep Tendon Reflexes: Reflexes are normal and symmetric.  Psychiatric:        Behavior: Behavior normal.        Thought Content: Thought content normal.        Judgment: Judgment normal.    BP 138/79   Pulse 63   Temp (!) 96.9 F (36.1 C) (Oral)   Ht _0  (1.676 m)   Wt 149 lb (67.6 kg)   BMI 24.05 kg/m   EKG- NSR      Assessment & Plan:  KEYSHIA ORWICK comes in today with chief complaint of Annual Exam   Diagnosis and orders addressed:  1. Annual  physical exam Told patient does not need pap for 3 years - Urinalysis, Complete - CBC with Differential/Platelet  2. Essential hypertension, benign Low sodium diet - CMP14+EGFR - EKG 12-Lead  3. Hyperlipidemia with target LDL less than 130 Low fat diet - Lipid panel  4. Hypothyroidism due to acquired atrophy of thyroid - Thyroid Panel With TSH  5. Recurrent major depressive disorder,  in full remission [facility]) Stress management reviewed  6. Primary insomnia Bedtime routine  7. BMI 27.0-27.9,adult Discussed diet and exercise for person with BMI >25 Will recheck weight in 3-6 months   Labs pending Health Maintenance reviewed Diet and exercise encouraged  Follow up plan: 6 months   [name] Hassell Done, FNP

## 2019-03-03 NOTE — Patient Instructions (Signed)
Fat and Cholesterol Restricted Eating Plan Eating a diet that limits fat and cholesterol may help lower your risk for heart disease and other conditions. Your body needs fat and cholesterol for basic functions, but eating too much of these things can be harmful to your health. Your health care provider may order lab tests to check your blood fat (lipid) and cholesterol levels. This helps your health care provider understand your risk for certain conditions and whether you need to make diet changes. Work with your health care provider or dietitian to make an eating plan that is right for you. Your plan includes:  Limit your fat intake to ______% or less of your total calories a day.  Limit your saturated fat intake to ______% or less of your total calories a day.  Limit the amount of cholesterol in your diet to less than _________mg a day.  Eat ___________ g of fiber a day. What are tips for following this plan? General guidelines   If you are overweight, work with your health care provider to lose weight safely. Losing just 5-10% of your body weight can improve your overall health and help prevent diseases such as diabetes and heart disease.  Avoid: ? Foods with added sugar. ? Fried foods. ? Foods that contain partially hydrogenated oils, including stick margarine, some tub margarines, cookies, crackers, and other baked goods.  Limit alcohol intake to no more than 1 drink a day for nonpregnant women and 2 drinks a day for men. One drink equals 12 oz of beer, 5 oz of wine, or 1 oz of hard liquor. Reading food labels  Check food labels for: ? Trans fats, partially hydrogenated oils, or high amounts of saturated fat. Avoid foods that contain saturated fat and trans fat. ? The amount of cholesterol in each serving. Try to eat no more than 200 mg of cholesterol each day. ? The amount of fiber in each serving. Try to eat at least 20-30 g of fiber each day.  Choose foods with healthy fats,  such as: ? Monounsaturated and polyunsaturated fats. These include olive and canola oil, flaxseeds, walnuts, almonds, and seeds. ? Omega-3 fats. These are found in foods such as salmon, mackerel, sardines, tuna, flaxseed oil, and ground flaxseeds.  Choose grain products that have whole grains. Look for the word "whole" as the first word in the ingredient list. Cooking  Cook foods using methods other than frying. Baking, boiling, grilling, and broiling are some healthy options.  Eat more home-cooked food and less restaurant, buffet, and fast food.  Avoid cooking using saturated fats. ? Animal sources of saturated fats include meats, butter, and cream. ? Plant sources of saturated fats include palm oil, palm kernel oil, and coconut oil. Meal planning   At meals, imagine dividing your plate into fourths: ? Fill one-half of your plate with vegetables and green salads. ? Fill one-fourth of your plate with whole grains. ? Fill one-fourth of your plate with lean protein foods.  Eat fish that is high in omega-3 fats at least two times a week.  Eat more foods that contain fiber, such as whole grains, beans, apples, broccoli, carrots, peas, and barley. These foods help promote healthy cholesterol levels in the blood. Recommended foods Grains  Whole grains, such as whole wheat or whole grain breads, crackers, cereals, and pasta. Unsweetened oatmeal, bulgur, barley, quinoa, or brown rice. Corn or whole wheat flour tortillas. Vegetables  Fresh or frozen vegetables (raw, steamed, roasted, or grilled). Green salads. Fruits  All fresh, canned (in natural juice), or frozen fruits. Meats and other protein foods  Ground beef (85% or leaner), grass-fed beef, or beef trimmed of fat. Skinless chicken or [place]. Ground chicken or [place]. Pork trimmed of fat. All fish and seafood. Egg whites. Dried beans, peas, or lentils. Unsalted nuts or seeds. Unsalted canned beans. Natural nut butters without added  sugar and oil. Dairy  Low-fat or nonfat dairy products, such as skim or 1% milk, 2% or reduced-fat cheeses, low-fat and fat-free ricotta or cottage cheese, or plain low-fat and nonfat yogurt. Fats and oils  Tub margarine without trans fats. Light or reduced-fat mayonnaise and salad dressings. Avocado. Olive, canola, sesame, or safflower oils. The items listed above may not be a complete list of recommended foods or beverages. Contact your dietitian for more options. Foods to avoid Grains  White bread. White pasta. White rice. Cornbread. Bagels, pastries, and croissants. Crackers and snack foods that contain trans fat and hydrogenated oils. Vegetables  Vegetables cooked in cheese, cream, or butter sauce. Fried vegetables. Fruits  Canned fruit in heavy syrup. Fruit in cream or butter sauce. Fried fruit. Meats and other protein foods  Fatty cuts of meat. Ribs, chicken wings, bacon, sausage, bologna, salami, chitterlings, fatback, hot dogs, bratwurst, and packaged lunch meats. Liver and organ meats. Whole eggs and egg yolks. Chicken and [place] with skin. Fried meat. Dairy  Whole or 2% milk, cream, half-and-half, and cream cheese. Whole milk cheeses. Whole-fat or sweetened yogurt. Full-fat cheeses. Nondairy creamers and whipped toppings. Processed cheese, cheese spreads, and cheese curds. Beverages  Alcohol. Sugar-sweetened drinks such as sodas, lemonade, and fruit drinks. Fats and oils  Butter, stick margarine, lard, shortening, ghee, or bacon fat. Coconut, palm kernel, and palm oils. Sweets and desserts  Corn syrup, sugars, honey, and molasses. Candy. Jam and jelly. Syrup. Sweetened cereals. Cookies, pies, cakes, donuts, muffins, and ice cream. The items listed above may not be a complete list of foods and beverages to avoid. Contact your dietitian for more information. Summary  Your body needs fat and cholesterol for basic functions. However, eating too much of these things can be  harmful to your health.  Work with your health care provider and dietitian to follow a diet low in fat and cholesterol. Doing this may help lower your risk for heart disease and other conditions.  Choose healthy fats, such as monounsaturated and polyunsaturated fats, and foods high in omega-3 fatty acids.  Eat fiber-rich foods, such as whole grains, beans, peas, fruits, and vegetables.  Limit or avoid alcohol, fried foods, and foods high in saturated fats, partially hydrogenated oils, and sugar. This information is not intended to replace advice given to you by your health care provider. Make sure you discuss any questions you have with your health care provider. Document Released: 09/08/2005 Document Revised: 05/26/2017 Document Reviewed: 05/26/2017 Elsevier Interactive Patient Education  2019 [employer].

## 2019-03-04 DIAGNOSIS — Z0000 Encounter for general adult medical examination without abnormal findings: Secondary | ICD-10-CM | POA: Diagnosis not present

## 2019-03-04 LAB — CBC WITH DIFFERENTIAL/PLATELET
Basophils Absolute: 0.1 10*3/uL (ref 0.0–0.2)
Basos: 1 %
EOS (ABSOLUTE): 0.2 10*3/uL (ref 0.0–0.4)
Eos: 3 %
Hematocrit: 39.6 % (ref 34.0–46.6)
Hemoglobin: 13.2 g/dL (ref 11.1–15.9)
Immature Grans (Abs): 0 10*3/uL (ref 0.0–0.1)
Immature Granulocytes: 0 %
Lymphocytes Absolute: 1.6 10*3/uL (ref 0.7–3.1)
Lymphs: 22 %
MCH: 29.5 pg (ref 26.6–33.0)
MCHC: 33.3 g/dL (ref 31.5–35.7)
MCV: 88 fL (ref 79–97)
Monocytes Absolute: 0.5 10*3/uL (ref 0.1–0.9)
Monocytes: 7 %
Neutrophils Absolute: 4.7 10*3/uL (ref 1.4–7.0)
Neutrophils: 67 %
Platelets: 257 10*3/uL (ref 150–450)
RBC: 4.48 x10E6/uL (ref 3.77–5.28)
RDW: 13 % (ref 11.7–15.4)
WBC: 7 10*3/uL (ref 3.4–10.8)

## 2019-03-04 LAB — CMP14+EGFR
ALT: 21 IU/L (ref 0–32)
AST: 45 IU/L — ABNORMAL HIGH (ref 0–40)
Albumin/Globulin Ratio: 2 (ref 1.2–2.2)
Albumin: 4.6 g/dL (ref 3.7–4.7)
Alkaline Phosphatase: 78 IU/L (ref 39–117)
BUN/Creatinine Ratio: 17 (ref 12–28)
BUN: 12 mg/dL (ref 8–27)
Bilirubin Total: 0.6 mg/dL (ref 0.0–1.2)
CO2: 26 mmol/L (ref 20–29)
Calcium: 9.6 mg/dL (ref 8.7–10.3)
Chloride: 92 mmol/L — ABNORMAL LOW (ref 96–106)
Creatinine, Ser: 0.7 mg/dL (ref 0.57–1.00)
GFR calc Af Amer: 99 mL/min/{1.73_m2} (ref >59)
GFR calc non Af Amer: 86 mL/min/{1.73_m2} (ref >59)
Globulin, Total: 2.3 g/dL (ref 1.5–4.5)
Glucose: 108 mg/dL — ABNORMAL HIGH (ref 65–99)
Potassium: 3.7 mmol/L (ref 3.5–5.2)
Sodium: 131 mmol/L — ABNORMAL LOW (ref 134–144)
Total Protein: 6.9 g/dL (ref 6.0–8.5)

## 2019-03-04 LAB — LIPID PANEL
Chol/HDL Ratio: 3.1 ratio (ref 0.0–4.4)
Cholesterol, Total: 185 mg/dL (ref 100–199)
HDL: 60 mg/dL (ref >39)
LDL Calculated: 106 mg/dL — ABNORMAL HIGH (ref 0–99)
Triglycerides: 97 mg/dL (ref 0–149)
VLDL Cholesterol Cal: 19 mg/dL (ref 5–40)

## 2019-03-04 LAB — THYROID PANEL WITH TSH
Free Thyroxine Index: 2.9 (ref 1.2–4.9)
T3 Uptake Ratio: 28 % (ref 24–39)
T4, Total: 10.3 ug/dL (ref 4.5–12.0)
TSH: 0.531 u[IU]/mL (ref 0.450–4.500)

## 2019-03-09 LAB — PAP IG (IMAGE GUIDED)

## 2019-03-22 ENCOUNTER — Other Ambulatory Visit: Payer: Self-pay | Admitting: *Deleted

## 2019-03-22 MED ORDER — ROSUVASTATIN CALCIUM 10 MG PO TABS: 10.0000 mg | ORAL_TABLET | Freq: Every day | ORAL | 3 refills | Status: DC

## 2019-03-28 ENCOUNTER — Telehealth: Payer: Self-pay

## 2019-03-28 NOTE — Telephone Encounter (Signed)
Covid-19 screening questions   Do you now or have you had a fever in the last 14 days? no  Do you have any respiratory symptoms of shortness of breath or cough now or in the last 14 days? no  Do you have any family members or close contacts with diagnosed or suspected Covid-19 in the past 14 days? no  Have you been tested for Covid-19 and found to be positive? no   These answers are for both the patient and also her daughter [name] who will be bringing her.

## 2019-03-29 ENCOUNTER — Ambulatory Visit: Payer: Medicare PPO | Admitting: Internal Medicine

## 2019-03-29 ENCOUNTER — Encounter: Payer: Self-pay | Admitting: Internal Medicine

## 2019-03-29 ENCOUNTER — Other Ambulatory Visit: Payer: Self-pay

## 2019-03-29 VITALS — BP 118/78 | HR 96 | Temp 97.8°F | Ht 66.0 in | Wt 146.5 lb

## 2019-03-29 DIAGNOSIS — R198 Other specified symptoms and signs involving the digestive system and abdomen: Secondary | ICD-10-CM | POA: Diagnosis not present

## 2019-03-29 DIAGNOSIS — K921 Melena: Secondary | ICD-10-CM | POA: Diagnosis not present

## 2019-03-29 DIAGNOSIS — R1032 Left lower quadrant pain: Secondary | ICD-10-CM | POA: Diagnosis not present

## 2019-03-29 DIAGNOSIS — R194 Change in bowel habit: Secondary | ICD-10-CM

## 2019-03-29 NOTE — Progress Notes (Signed)
Jasmine White 73 y.o. [birth year] [id number]  Assessment & Plan:   Encounter Diagnoses  Name Primary?   Blood in stool Yes   Change in bowel habits    LLQ pain    Rectal symptom     Main differential here I think is ischemic colitis versus inflammatory bowel disease.  She did not have obvious diverticulosis on colonoscopy in 2016 but that could be a problem.  Colorectal neoplasm/malignancy in the differential but seems less likely.  Evaluate with colonoscopy.  Continue daily MiraLAX.  Consider rectal exam while awake because of the rectal symptoms of vague numbness she has, that may be related to her exercise change.  The risks and benefits as well as alternatives of endoscopic procedure(s) have been discussed and reviewed. All questions answered. The patient agrees to proceed. She understands the possibility of contracting COVID-19 illness through healthcare exposure.  I appreciate the opportunity to care for this patient. GE:XBMWUX, [name], FNP  Subjective:   Chief Complaint: Blood in stools  HPI This 73 year old married white woman is here with her daughter because of blood in stools.  Around Mother's Day she awakened at 3 AM with severe left lower quadrant pain after a period of constipation have been ongoing, and the pain "rolled across my abdomen like to cats fighting in there".  She has been having this off and on and she has been having some mucus and blood mixed in the stools.  She saw Chevis Pretty, FNP on June 11 where a CBC was normal she was recommended to use MiraLAX to try to regulate alternating bowel habits.  She said her bowels went from rocks to pebbles to cream potato consistency now.  The last bleeding which is not much in bright red but with some mucus and it was about a week ago.  She is not had problems like this before.  She had a negative screening colonoscopy in June 2016.  She is noticed some deadness or numbness like feeling in her  anal and rectal area at times when she first sits but her exercise regimen changed quite a bit to where she is sitting and using a "imaginary bicycle", it is a pedal machine where she sits on the floor, and she has been doing different stretches.  [facility] says she could not do her regular exercise.  CBC and TSH were normal on June 11, her sodium was slightly low at 131 and chloride 92   Allergies  Allergen Reactions   Percocet [Oxycodone-Acetaminophen] Nausea Only   Ultram [Tramadol]     Makes you crazy and nauseated   Current Meds  Medication Sig   amitriptyline (ELAVIL) 50 MG tablet Take 1 tablet (50 mg total) by mouth at bedtime.   Coenzyme Q10 (CO Q-10) 200 MG CAPS Take 1 capsule by mouth daily.   Garlic 3244 MG CAPS Take 1 capsule by mouth daily. Take 1000 mg daily   HYDROcodone-acetaminophen (NORCO/VICODIN) 5-325 MG tablet Take 1 tablet by mouth every 6 (six) hours as needed for moderate pain.   losartan-hydrochlorothiazide (HYZAAR) 100-12.5 MG tablet Take 1 tablet by mouth daily.   Lutein 20 MG CAPS Take 1 capsule by mouth daily.   metoprolol succinate (TOPROL-XL) 100 MG 24 hr tablet Take 1 tablet (100 mg total) by mouth daily. Take with or immediately following a meal.   Omega-3 Fatty Acids (FISH OIL) 1200 MG CAPS Take by mouth.   Saline (SIMPLY SALINE) 0.9 %  AERS Place 1 spray into the nose daily as needed. For sinuses   SYNTHROID 88 MCG tablet TAKE 1 TABLET BY MOUTH ONCE DAILY BEFORE BREAKFAST   Past Medical History:  Diagnosis Date   Arthritis    Bruises easily    Cancer ([place])    basal skin cancer   Chronic back pain    HNP/stenosis and radiculopathy   Hyperlipidemia    takes Pravastatin daily   Hypertension    takes Hyzaar and toprol daily   Hypothyroidism    takes Synthroid daily   Insomnia    takes Elevil nightly   Joint pain    Joint swelling    Low BP    past some sedation   Nocturia    PONV  (postoperative nausea and vomiting)    with knee replacement b/p dropped   Sinusitis    finished zpak yesterday   Urinary frequency    Past Surgical History:  Procedure Laterality Date   ABDOMINAL HYSTERECTOMY     BACK SURGERY     basal cell skin cancer     on face and forehead   bil knee replacements     BREAST BIOPSY     left breast/benign   LUMBAR LAMINECTOMY/DECOMPRESSION MICRODISCECTOMY  08/13/2012   Procedure: LUMBAR LAMINECTOMY/DECOMPRESSION MICRODISCECTOMY 1 LEVEL;  Surgeon: Erline Levine, MD;  Location: [facility];  Service: Neurosurgery;  Laterality: Left;  Left lumbar one-two Microdiskectomy   Social History   Social History Narrative   Not on file   family history includes Alcohol abuse in her father; Arthritis in her brother and brother; Cancer (age of onset: 72) in her brother; Heart disease in her father and mother; Hypertension in her mother; Stroke in her father.   Review of Systems All other review of systems negative  Objective:   Physical Exam @BP  118/78 (BP Location: Right Arm, Patient Position: Sitting, Cuff Size: Normal)    Pulse 96    Temp 97.8 F (36.6 C) (Other (Comment)) Comment (Src): thermascan   Ht 5\' 6"  (1.676 m)    Wt 146 lb 8 oz (66.5 kg)    SpO2 97%    BMI 23.65 kg/m @  General:  Vibrant elderly woman looking younger than stated age, well-developed, well-nourished and in no acute distress Eyes:  anicteric.  Lungs: Clear to auscultation bilaterally. Heart:   S1S2, no rubs, murmurs, gallops. Abdomen:  soft, non-tender, no hepatosplenomegaly, hernia, or mass and BS+.  Rectal: Deferred until colonoscopy Lymph:  no cervical or supraclavicular adenopathy. Extremities:   no edema, cyanosis or clubbing Skin   no rash. Neuro:  A&O x 3.  Psych:  appropriate mood and  Affect.   Data Reviewed: Primary care notes previous colonoscopy including images, labs in the EMR from this year

## 2019-03-29 NOTE — Patient Instructions (Signed)
You have been scheduled for a colonoscopy. Please follow written instructions given to you at your visit today.  Please pick up your prep supplies at the pharmacy within the next 1-3 days. If you use inhalers (even only as needed), please bring them with you on the day of your procedure.   I appreciate the opportunity to care for you. Carl Gessner, MD, FACG 

## 2019-04-01 ENCOUNTER — Telehealth: Payer: Self-pay | Admitting: Internal Medicine

## 2019-04-01 NOTE — Telephone Encounter (Signed)

## 2019-04-01 NOTE — Telephone Encounter (Signed)
Pt responded "no" to all screening questions °

## 2019-04-02 ENCOUNTER — Encounter: Payer: Self-pay | Admitting: Internal Medicine

## 2019-04-02 ENCOUNTER — Ambulatory Visit (AMBULATORY_SURGERY_CENTER): Payer: Medicare PPO | Admitting: Internal Medicine

## 2019-04-02 ENCOUNTER — Other Ambulatory Visit: Payer: Self-pay

## 2019-04-02 VITALS — BP 123/72 | HR 61 | Temp 98.8°F | Resp 20 | Ht 66.0 in | Wt 146.0 lb

## 2019-04-02 DIAGNOSIS — K648 Other hemorrhoids: Secondary | ICD-10-CM | POA: Diagnosis not present

## 2019-04-02 DIAGNOSIS — D123 Benign neoplasm of transverse colon: Secondary | ICD-10-CM

## 2019-04-02 DIAGNOSIS — K635 Polyp of colon: Secondary | ICD-10-CM

## 2019-04-02 DIAGNOSIS — R194 Change in bowel habit: Secondary | ICD-10-CM | POA: Diagnosis not present

## 2019-04-02 DIAGNOSIS — K921 Melena: Secondary | ICD-10-CM | POA: Diagnosis not present

## 2019-04-02 MED ORDER — SODIUM CHLORIDE 0.9 % IV SOLN: 500.0000 mL | Freq: Once | INTRAVENOUS | Status: DC

## 2019-04-02 MED ORDER — FLEET ENEMA 7-19 GM/118ML RE ENEM
1.0000 | ENEMA | Freq: Once | RECTAL | Status: AC
  Administered 2019-04-02: 1 via RECTAL

## 2019-04-02 NOTE — Patient Instructions (Addendum)
I found and removed one polyp. I think you most likely bled from enlarged hemorrhoids. There was an area of possible colitis (inflammation in colon) but that may have just been irritation from the preparation. You also have a condition called diverticulosis - common and not usually a problem but can be related to changes in bowels. Please read the handout provided.  I will let you know pathology results and plans soon.  I appreciate the opportunity to care for you. Gatha Mayer, MD, [facility]   Discharge instructions given. Handouts on polyps and hemorrhoids. Resume previous medications. YOU HAD AN ENDOSCOPIC PROCEDURE TODAY AT [facility]:   Refer to the procedure report that was given to you for any specific questions about what was found during the examination.  If the procedure report does not answer your questions, please call your gastroenterologist to clarify.  If you requested that your care partner not be given the details of your procedure findings, then the procedure report has been included in a sealed envelope for you to review at your convenience later.  YOU SHOULD EXPECT: Some feelings of bloating in the abdomen. Passage of more gas than usual.  Walking can help get rid of the air that was put into your GI tract during the procedure and reduce the bloating. If you had a lower endoscopy (such as a colonoscopy or flexible sigmoidoscopy) you may notice spotting of blood in your stool or on the toilet paper. If you underwent a bowel prep for your procedure, you may not have a normal bowel movement for a few days.  Please Note:  You might notice some irritation and congestion in your nose or some drainage.  This is from the oxygen used during your procedure.  There is no need for concern and it should clear up in a day or so.  SYMPTOMS TO REPORT IMMEDIATELY:    Following upper endoscopy (EGD)  Vomiting of blood or coffee ground material  New chest pain or  pain under the shoulder blades  Painful or persistently difficult swallowing  New shortness of breath  Fever of 100F or higher  Black, tarry-looking stools  For urgent or emergent issues, a gastroenterologist can be reached at any hour by calling [phone].   DIET:  We do recommend a small meal at first, but then you may proceed to your regular diet.  Drink plenty of fluids but you should avoid alcoholic beverages for 24 hours.  ACTIVITY:  You should plan to take it easy for the rest of today and you should NOT DRIVE or use heavy machinery until tomorrow (because of the sedation medicines used during the test).    FOLLOW UP: Our staff will call the number listed on your records 48-72 hours following your procedure to check on you and address any questions or concerns that you may have regarding the information given to you following your procedure. If we do not reach you, we will leave a message.  We will attempt to reach you two times.  During this call, we will ask if you have developed any symptoms of COVID 19. If you develop any symptoms (ie: fever, flu-like symptoms, shortness of breath, cough etc.) before then, please call [phone].  If you test positive for Covid 19 in the 2 weeks post procedure, please call and report this information to [place].    If any biopsies were taken you will be contacted by phone or by letter within the next 1-3  weeks.  Please call us at [phone] if you have not heard about the biopsies in 3 weeks.    SIGNATURES/CONFIDENTIALITY: You and/or your care partner have signed paperwork which will be entered into your electronic medical record.  These signatures attest to the fact that that the information above on your After Visit Summary has been reviewed and is understood.  Full responsibility of the confidentiality of this discharge information lies with you and/or your care-partner.

## 2019-04-02 NOTE — Progress Notes (Signed)
To PACU, VSS. Report to [email or web address] 

## 2019-04-02 NOTE — Progress Notes (Signed)
Pt's states no medical or surgical changes since previsit or office visit.  Vitals by [place]

## 2019-04-02 NOTE — Progress Notes (Signed)
Called to room to assist during endoscopic procedure.  Patient ID and intended procedure confirmed with present staff. Received instructions for my participation in the procedure from the performing physician.  

## 2019-04-02 NOTE — Op Note (Signed)
[facility] Patient Name: Jasmine White Procedure Date: 04/02/2019 8:26 AM MRN: [id number] Endoscopist: Gatha Mayer , MD Age: 73 Referring MD:  Date of Birth: 11-16-1945 Gender: Female Account #: [id number] Procedure:                Colonoscopy Indications:              Hematochezia, Change in bowel habits Medicines:                Propofol per Anesthesia Procedure:                Pre-Anesthesia Assessment:                           - Prior to the procedure, a History and Physical                            was performed, and patient medications and                            allergies were reviewed. The patient's tolerance of                            previous anesthesia was also reviewed. The risks                            and benefits of the procedure and the sedation                            options and risks were discussed with the patient.                            All questions were answered, and informed consent                            was obtained. Prior Anticoagulants: The patient has                            taken no previous anticoagulant or antiplatelet                            agents. ASA Grade Assessment: II - A patient with                            mild systemic disease. After reviewing the risks                            and benefits, the patient was deemed in                            satisfactory condition to undergo the procedure.                           After obtaining informed consent, the colonoscope  was passed under direct vision. Throughout the                            procedure, the patient's blood pressure, pulse, and                            oxygen saturations were monitored continuously. The                            Colonoscope was introduced through the anus and                            advanced to the the cecum, identified by                            appendiceal orifice and ileocecal  valve. The                            colonoscopy was performed without difficulty. The                            patient tolerated the procedure well. The quality                            of the bowel preparation was adequate. The bowel                            preparation used was Miralax via split dose                            instruction. The ileocecal valve, appendiceal                            orifice, and rectum were photographed. Scope In: 8:40:59 AM Scope Out: 9:12:52 AM Scope Withdrawal Time: 0 hours 26 minutes 26 seconds  Total Procedure Duration: 0 hours 31 minutes 53 seconds  Findings:                 The perianal and digital rectal examinations were                            normal.                           A 7 mm polyp was found in the transverse colon. The                            polyp was flat. The polyp was removed with a cold                            snare. Resection and retrieval were complete.                            Verification of patient identification for the  specimen was done. Estimated blood loss was minimal.                           A patchy area of mildly erythematous, eroded and                            inflamed mucosa was found in the distal sigmoid                            colon. Biopsies were taken with a cold forceps for                            histology. Verification of patient identification                            for the specimen was done. Estimated blood loss was                            minimal.                           Internal hemorrhoids were found during retroflexion.                           The exam was otherwise without abnormality on                            direct and retroflexion views. Complications:            No immediate complications. Estimated Blood Loss:     Estimated blood loss was minimal. Impression:               BLEEDING COULD HAVE BEEN FROM HEMORRHOIDS -                             ALTHOUGH SIGMOID CHANGES COULD BE RESOLVING                            ISCHEMIA SUSPECT PREP EFFECTS MORE LIKELY                           - One 7 mm polyp in the transverse colon, removed                            with a cold snare. Resected and retrieved.                           - Erythematous, eroded and inflamed mucosa in the                            distal sigmoid colon. Biopsied.                           - Internal hemorrhoids.                           -  The examination was otherwise normal on direct                            and retroflexion views. Recommendation:           - Patient has a contact number available for                            emergencies. The signs and symptoms of potential                            delayed complications were discussed with the                            patient. Return to normal activities tomorrow.                            Written discharge instructions were provided to the                            patient.                           - Resume previous diet.                           - Continue present medications. miralax daily                           - Await pathology results.                           - No recommendation at this time regarding repeat                            colonoscopy due to age. Gatha Mayer, MD 04/02/2019 9:24:16 AM This report has been signed electronically.

## 2019-04-05 ENCOUNTER — Telehealth: Payer: Self-pay

## 2019-04-05 NOTE — Telephone Encounter (Signed)
  Follow up Call-  Call back number 04/02/2019  Post procedure Call Back phone  # [phone]  Permission to leave phone message Yes  Some recent data might be hidden     Patient questions:  Do you have a fever, pain , or abdominal swelling? No. Pain Score  0 *  Have you tolerated food without any problems? Yes.    Have you been able to return to your normal activities? Yes.    Do you have any questions about your discharge instructions: Diet   No. Medications  No. Follow up visit  No.  Do you have questions or concerns about your Care? No.  Actions: * If pain score is 4 or above: No action needed, pain <4.  1. Have you developed a fever since your procedure? no  2.   Have you had an respiratory symptoms (SOB or cough) since your procedure? no  3.   Have you tested positive for COVID 19 since your procedure no  4.   Have you had any family members/close contacts diagnosed with the COVID 19 since your procedure?  no   If yes to any of these questions please route to Joylene John, RN and Alphonsa Gin, Therapist, sports.

## 2019-04-11 ENCOUNTER — Encounter: Payer: Self-pay | Admitting: Internal Medicine

## 2019-04-11 NOTE — Progress Notes (Signed)
My Chart letter No recall bxs ok, polyp was not retrieved

## 2019-04-29 ENCOUNTER — Other Ambulatory Visit: Payer: Self-pay | Admitting: Nurse Practitioner

## 2019-04-29 DIAGNOSIS — Z1231 Encounter for screening mammogram for malignant neoplasm of breast: Secondary | ICD-10-CM

## 2019-06-14 ENCOUNTER — Other Ambulatory Visit: Payer: Self-pay

## 2019-06-14 ENCOUNTER — Ambulatory Visit
Admission: RE | Admit: 2019-06-14 | Discharge: 2019-06-14 | Disposition: A | Discharge status: Home / Self Care | Payer: Medicare PPO | Source: Ambulatory Visit | Attending: Nurse Practitioner | Admitting: Nurse Practitioner

## 2019-06-14 DIAGNOSIS — Z1231 Encounter for screening mammogram for malignant neoplasm of breast: Secondary | ICD-10-CM

## 2019-07-21 DIAGNOSIS — H40033 Anatomical narrow angle, bilateral: Secondary | ICD-10-CM | POA: Diagnosis not present

## 2019-07-21 DIAGNOSIS — H43393 Other vitreous opacities, bilateral: Secondary | ICD-10-CM | POA: Diagnosis not present

## 2019-09-01 ENCOUNTER — Other Ambulatory Visit: Payer: Self-pay

## 2019-09-02 ENCOUNTER — Ambulatory Visit (INDEPENDENT_AMBULATORY_CARE_PROVIDER_SITE_OTHER): Payer: Medicare PPO | Admitting: Nurse Practitioner

## 2019-09-02 ENCOUNTER — Encounter: Payer: Self-pay | Admitting: Nurse Practitioner

## 2019-09-02 VITALS — BP 120/72 | HR 78 | Temp 97.1°F | Resp 20 | Ht 66.0 in | Wt 133.0 lb

## 2019-09-02 DIAGNOSIS — E034 Atrophy of thyroid (acquired): Secondary | ICD-10-CM

## 2019-09-02 DIAGNOSIS — Z6827 Body mass index (BMI) 27.0-27.9, adult: Secondary | ICD-10-CM | POA: Diagnosis not present

## 2019-09-02 DIAGNOSIS — E785 Hyperlipidemia, unspecified: Secondary | ICD-10-CM

## 2019-09-02 DIAGNOSIS — F5101 Primary insomnia: Secondary | ICD-10-CM

## 2019-09-02 DIAGNOSIS — I10 Essential (primary) hypertension: Secondary | ICD-10-CM

## 2019-09-02 DIAGNOSIS — F3342 Major depressive disorder, recurrent, in full remission: Secondary | ICD-10-CM | POA: Diagnosis not present

## 2019-09-02 DIAGNOSIS — R5383 Other fatigue: Secondary | ICD-10-CM | POA: Diagnosis not present

## 2019-09-02 MED ORDER — SYNTHROID 88 MCG PO TABS: ORAL_TABLET | ORAL | 1 refills | Status: DC

## 2019-09-02 MED ORDER — AMITRIPTYLINE HCL 50 MG PO TABS: 50.0000 mg | ORAL_TABLET | Freq: Every day | ORAL | 1 refills | Status: DC

## 2019-09-02 MED ORDER — METOPROLOL SUCCINATE ER 100 MG PO TB24: 100.0000 mg | ORAL_TABLET | Freq: Every day | ORAL | 1 refills | Status: DC

## 2019-09-02 MED ORDER — LOSARTAN POTASSIUM-HCTZ 100-12.5 MG PO TABS: 1.0000 | ORAL_TABLET | Freq: Every day | ORAL | 1 refills | Status: DC

## 2019-09-02 NOTE — Patient Instructions (Signed)

## 2019-09-02 NOTE — Progress Notes (Signed)
 Subjective:    Patient ID: Abra M Shearman, female    DOB: 02/12/1946, 73 y.o.   MRN: [id number]   Chief Complaint: Medical Management of Chronic Issues    HPI:  1. Essential hypertension, benign No c/o chest pain, sob or headache. Does not check blood pressure at home. BP Readings from Last 3 Encounters:  09/02/19 120/72  04/02/19 123/72  03/29/19 118/78     2. Hyperlipidemia with target LDL less than 130 Watches diet daily and stays very active. Lab Results  Component Value Date   CHOL 185 03/03/2019   HDL 60 03/03/2019   LDLCALC 106 (H) 03/03/2019   TRIG 97 03/03/2019   CHOLHDL 3.1 03/03/2019     3. Hypothyroidism due to acquired atrophy of thyroid No problems that aware of Lab Results  Component Value Date   TSH 0.531 03/03/2019     4. Recurrent major depressive disorder, in full remission (HCC) Is dong well. Is on no medications. Depression screen PHQ 2/9 09/02/2019 03/03/2019 01/20/2019  Decreased Interest 0 0 0  Down, Depressed, Hopeless 0 0 0  PHQ - 2 Score 0 0 0  Altered sleeping - - -  Tired, decreased energy - - -  Change in appetite - - -  Feeling bad or failure about yourself  - - -  Trouble concentrating - - -  Moving slowly or fidgety/restless - - -  Suicidal thoughts - - -  PHQ-9 Score - - -     5. Primary insomnia Takes elavil at night to sleep. says that she rests well.  6. BMI 27.0-27.9,adult Weight is down 13lbs Wt Readings from Last 3 Encounters:  09/02/19 133 lb (60.3 kg)  04/02/19 146 lb (66.2 kg)  03/29/19 146 lb 8 oz (66.5 kg)   BMI Readings from Last 3 Encounters:  09/02/19 21.47 kg/m  04/02/19 23.57 kg/m  03/29/19 23.65 kg/m      Outpatient Encounter Medications as of 09/02/2019  Medication Sig  . amitriptyline (ELAVIL) 50 MG tablet Take 1 tablet (50 mg total) by mouth at bedtime.  . Coenzyme Q10 (CO Q-10) 200 MG CAPS Take 1 capsule by mouth daily.  . Garlic 2000 MG CAPS Take 1 capsule by mouth daily. Take  1000 mg daily  . losartan-hydrochlorothiazide (HYZAAR) 100-12.5 MG tablet Take 1 tablet by mouth daily.  . Lutein 20 MG CAPS Take 1 capsule by mouth daily.  . metoprolol succinate (TOPROL-XL) 100 MG 24 hr tablet Take 1 tablet (100 mg total) by mouth daily. Take with or immediately following a meal.  . Omega-3 Fatty Acids (FISH OIL) 1200 MG CAPS Take by mouth.  . Saline (SIMPLY SALINE) 0.9 % AERS Place 1 spray into the nose daily as needed. For sinuses  . SYNTHROID 88 MCG tablet TAKE 1 TABLET BY MOUTH ONCE DAILY BEFORE BREAKFAST  . HYDROcodone-acetaminophen (NORCO/VICODIN) 5-325 MG tablet Take 1 tablet by mouth every 6 (six) hours as needed for moderate pain. (Patient not taking: Reported on 09/02/2019)     Past Surgical History:  Procedure Laterality Date  . ABDOMINAL HYSTERECTOMY    . BACK SURGERY    . basal cell skin cancer     on face and forehead  . bil knee replacements    . BREAST BIOPSY     left breast/benign  . COLONOSCOPY    . LUMBAR LAMINECTOMY/DECOMPRESSION MICRODISCECTOMY  08/13/2012   Procedure: LUMBAR LAMINECTOMY/DECOMPRESSION MICRODISCECTOMY 1 LEVEL;  Surgeon: Joseph Stern, MD;  Location: [facility];  Service: Neurosurgery;    Laterality: Left;  Left lumbar one-two Microdiskectomy    Family History  Problem Relation Age of Onset  . Hypertension Mother   . Heart disease Mother   . Stroke Father   . Heart disease Father   . Alcohol abuse Father   . Arthritis Brother   . Cancer Brother 68       prostate  . Arthritis Brother   . Colon cancer Neg Hx   . Colon polyps Neg Hx   . Stomach cancer Neg Hx   . Rectal cancer Neg Hx     New complaints: Patient had sinus issues in October and body aches. She was not tested for covid. Has had lots of fatigue since then and while she was sick she lost 13lbs. She says she still has no energy.  Social history: Lives with her husband who has RA  Controlled substance contract: n/a    Review of Systems  Constitutional:  Negative for diaphoresis.  Eyes: Negative for pain.  Respiratory: Negative for shortness of breath.   Cardiovascular: Negative for chest pain, palpitations and leg swelling.  Gastrointestinal: Negative for abdominal pain.  Endocrine: Negative for polydipsia.  Skin: Negative for rash.  Neurological: Negative for dizziness, weakness and headaches.  Hematological: Does not bruise/bleed easily.  All other systems reviewed and are negative.      Objective:   Physical Exam Vitals and nursing note reviewed.  Constitutional:      General: She is not in acute distress.    Appearance: Normal appearance. She is well-developed.  HENT:     Head: Normocephalic.     Nose: Nose normal.  Eyes:     Pupils: Pupils are equal, round, and reactive to light.  Neck:     Vascular: No carotid bruit or JVD.  Cardiovascular:     Rate and Rhythm: Normal rate and regular rhythm.     Heart sounds: Normal heart sounds.  Pulmonary:     Effort: Pulmonary effort is normal. No respiratory distress.     Breath sounds: Normal breath sounds. No wheezing or rales.  Chest:     Chest wall: No tenderness.  Abdominal:     General: Bowel sounds are normal. There is no distension or abdominal bruit.     Palpations: Abdomen is soft. There is no hepatomegaly, splenomegaly, mass or pulsatile mass.     Tenderness: There is no abdominal tenderness.  Musculoskeletal:        General: Normal range of motion.     Cervical back: Normal range of motion and neck supple.  Lymphadenopathy:     Cervical: No cervical adenopathy.  Skin:    General: Skin is warm and dry.  Neurological:     Mental Status: She is alert and oriented to person, place, and time.     Deep Tendon Reflexes: Reflexes are normal and symmetric.  Psychiatric:        Behavior: Behavior normal.        Thought Content: Thought content normal.        Judgment: Judgment normal.     BP 120/72   Pulse 78   Temp (!) 97.1 F (36.2 C) (Temporal)   Resp 20    Ht 5' 6" (1.676 m)   Wt 133 lb (60.3 kg)   SpO2 99%   BMI 21.47 kg/m        Assessment & Plan:  Maryn M Whiston comes in today with chief complaint of Medical Management of Chronic Issues   Diagnosis and   orders addressed:  1. Essential hypertension, benign Low sodium diet - metoprolol succinate (TOPROL-XL) 100 MG 24 hr tablet; Take 1 tablet (100 mg total) by mouth daily. Take with or immediately following a meal.  Dispense: 90 tablet; Refill: 1 - losartan-hydrochlorothiazide (HYZAAR) 100-12.5 MG tablet; Take 1 tablet by mouth daily.  Dispense: 90 tablet; Refill: 1 - CMP14+EGFR  2. Hyperlipidemia with target LDL less than 130 Low fat diet - Lipid panel  3. Hypothyroidism due to acquired atrophy of thyroid - SYNTHROID 88 MCG tablet; TAKE 1 TABLET BY MOUTH ONCE DAILY BEFORE BREAKFAST  Dispense: 90 tablet; Refill: 1  4. Recurrent major depressive disorder, in full remission ([place]) Stress management  5. Primary insomnia beditme routine - amitriptyline (ELAVIL) 50 MG tablet; Take 1 tablet (50 mg total) by mouth at bedtime.  Dispense: 90 tablet; Refill: 1  6. BMI 27.0-27.9,adult Discussed diet and exercise for person with BMI >25 Will recheck weight in 3-6 months  7. Fatigue, unspecified type labs pending - CBC with Differential/Platelet - Thyroid Panel With TSH   Labs pending Health Maintenance reviewed Diet and exercise encouraged  Follow up plan: 6 months   [place], FNP

## 2019-09-03 LAB — CBC WITH DIFFERENTIAL/PLATELET
Basophils Absolute: 0.1 10*3/uL (ref 0.0–0.2)
Basos: 1 %
EOS (ABSOLUTE): 0.1 10*3/uL (ref 0.0–0.4)
Eos: 1 %
Hematocrit: 32.4 % — ABNORMAL LOW (ref 34.0–46.6)
Hemoglobin: 10.9 g/dL — ABNORMAL LOW (ref 11.1–15.9)
Immature Grans (Abs): 0.1 10*3/uL (ref 0.0–0.1)
Immature Granulocytes: 1 %
Lymphocytes Absolute: 1.2 10*3/uL (ref 0.7–3.1)
Lymphs: 9 %
MCH: 27.4 pg (ref 26.6–33.0)
MCHC: 33.6 g/dL (ref 31.5–35.7)
MCV: 81 fL (ref 79–97)
Monocytes Absolute: 0.7 10*3/uL (ref 0.1–0.9)
Monocytes: 5 %
Neutrophils Absolute: 10.6 10*3/uL — ABNORMAL HIGH (ref 1.4–7.0)
Neutrophils: 83 %
Platelets: 487 10*3/uL — ABNORMAL HIGH (ref 150–450)
RBC: 3.98 x10E6/uL (ref 3.77–5.28)
RDW: 12.2 % (ref 11.7–15.4)
WBC: 12.8 10*3/uL — ABNORMAL HIGH (ref 3.4–10.8)

## 2019-09-03 LAB — CMP14+EGFR
ALT: 11 IU/L (ref 0–32)
AST: 27 IU/L (ref 0–40)
Albumin/Globulin Ratio: 1.2 (ref 1.2–2.2)
Albumin: 3.7 g/dL (ref 3.7–4.7)
Alkaline Phosphatase: 95 IU/L (ref 39–117)
BUN/Creatinine Ratio: 28 (ref 12–28)
BUN: 22 mg/dL (ref 8–27)
Bilirubin Total: 0.5 mg/dL (ref 0.0–1.2)
CO2: 28 mmol/L (ref 20–29)
Calcium: 9.7 mg/dL (ref 8.7–10.3)
Chloride: 89 mmol/L — ABNORMAL LOW (ref 96–106)
Creatinine, Ser: 0.78 mg/dL (ref 0.57–1.00)
GFR calc Af Amer: 87 mL/min/{1.73_m2} (ref >59)
GFR calc non Af Amer: 75 mL/min/{1.73_m2} (ref >59)
Globulin, Total: 3.2 g/dL (ref 1.5–4.5)
Glucose: 116 mg/dL — ABNORMAL HIGH (ref 65–99)
Potassium: 3.6 mmol/L (ref 3.5–5.2)
Sodium: 132 mmol/L — ABNORMAL LOW (ref 134–144)
Total Protein: 6.9 g/dL (ref 6.0–8.5)

## 2019-09-03 LAB — LIPID PANEL
Chol/HDL Ratio: 3 ratio (ref 0.0–4.4)
Cholesterol, Total: 159 mg/dL (ref 100–199)
HDL: 53 mg/dL (ref >39)
LDL Chol Calc (NIH): 91 mg/dL (ref 0–99)
Triglycerides: 80 mg/dL (ref 0–149)
VLDL Cholesterol Cal: 15 mg/dL (ref 5–40)

## 2019-09-03 LAB — THYROID PANEL WITH TSH
Free Thyroxine Index: 3.1 (ref 1.2–4.9)
T3 Uptake Ratio: 32 % (ref 24–39)
T4, Total: 9.8 ug/dL (ref 4.5–12.0)
TSH: 0.811 u[IU]/mL (ref 0.450–4.500)

## 2019-09-05 ENCOUNTER — Telehealth: Payer: Self-pay | Admitting: Nurse Practitioner

## 2019-09-05 NOTE — Telephone Encounter (Signed)
Refer to lab encounter

## 2019-09-06 ENCOUNTER — Telehealth: Payer: Self-pay | Admitting: *Deleted

## 2019-09-06 NOTE — Telephone Encounter (Addendum)
Prior Auth for Amitriptyline HCL 50mg  tab-APPROVED til 09/21/20  Key: [id number] -   PA Case ID: [id number]  Pharmacy notified

## 2019-12-27 ENCOUNTER — Encounter: Payer: Self-pay | Admitting: *Deleted

## 2020-03-01 ENCOUNTER — Other Ambulatory Visit: Payer: Self-pay

## 2020-03-01 ENCOUNTER — Ambulatory Visit: Payer: Medicare PPO | Admitting: Nurse Practitioner

## 2020-03-01 ENCOUNTER — Encounter: Payer: Self-pay | Admitting: Nurse Practitioner

## 2020-03-01 VITALS — BP 133/76 | HR 70 | Temp 97.1°F | Resp 20 | Ht 66.0 in | Wt 136.0 lb

## 2020-03-01 DIAGNOSIS — M545 Low back pain, unspecified: Secondary | ICD-10-CM

## 2020-03-01 DIAGNOSIS — E785 Hyperlipidemia, unspecified: Secondary | ICD-10-CM | POA: Diagnosis not present

## 2020-03-01 DIAGNOSIS — F3342 Major depressive disorder, recurrent, in full remission: Secondary | ICD-10-CM | POA: Diagnosis not present

## 2020-03-01 DIAGNOSIS — G8929 Other chronic pain: Secondary | ICD-10-CM

## 2020-03-01 DIAGNOSIS — F5101 Primary insomnia: Secondary | ICD-10-CM

## 2020-03-01 DIAGNOSIS — E034 Atrophy of thyroid (acquired): Secondary | ICD-10-CM | POA: Diagnosis not present

## 2020-03-01 DIAGNOSIS — Z6827 Body mass index (BMI) 27.0-27.9, adult: Secondary | ICD-10-CM | POA: Diagnosis not present

## 2020-03-01 DIAGNOSIS — I10 Essential (primary) hypertension: Secondary | ICD-10-CM | POA: Diagnosis not present

## 2020-03-01 MED ORDER — HYDROCODONE-ACETAMINOPHEN 5-325 MG PO TABS: 1.0000 | ORAL_TABLET | Freq: Four times a day (QID) | ORAL | 0 refills | Status: DC | PRN

## 2020-03-01 MED ORDER — METOPROLOL SUCCINATE ER 100 MG PO TB24: 100.0000 mg | ORAL_TABLET | Freq: Every day | ORAL | 1 refills | Status: DC

## 2020-03-01 MED ORDER — SYNTHROID 88 MCG PO TABS: ORAL_TABLET | ORAL | 1 refills | Status: AC

## 2020-03-01 MED ORDER — LOSARTAN POTASSIUM-HCTZ 100-12.5 MG PO TABS: 1.0000 | ORAL_TABLET | Freq: Every day | ORAL | 1 refills | Status: DC

## 2020-03-01 NOTE — Patient Instructions (Signed)
Stress, Adult Stress is a normal reaction to life events. Stress is what you feel when life demands more than you are used to, or more than you think you can handle. Some stress can be useful, such as studying for a test or meeting a deadline at work. Stress that occurs too often or for too long can cause problems. It can affect your emotional health and interfere with relationships and normal daily activities. Too much stress can weaken your body's defense system (immune system) and increase your risk for physical illness. If you already have a medical problem, stress can make it worse. What are the causes? All sorts of life events can cause stress. An event that causes stress for one person may not be stressful for another person. Major life events, whether positive or negative, commonly cause stress. Examples include:  Losing a job or starting a new job.  Losing a loved one.  Moving to a new town or home.  Getting married or divorced.  Having a baby.  Getting injured or sick. Less obvious life events can also cause stress, especially if they occur day after day or in combination with each other. Examples include:  Working long hours.  Driving in traffic.  Caring for children.  Being in debt.  Being in a difficult relationship. What are the signs or symptoms? Stress can cause emotional symptoms, including:  Anxiety. This is feeling worried, afraid, on edge, overwhelmed, or out of control.  Anger, including irritation or impatience.  Depression. This is feeling sad, down, helpless, or guilty.  Trouble focusing, remembering, or making decisions. Stress can cause physical symptoms, including:  Aches and pains. These may affect your head, neck, back, stomach, or other areas of your body.  Tight muscles or a clenched jaw.  Low energy.  Trouble sleeping. Stress can cause unhealthy behaviors, including:  Eating to feel better (overeating) or skipping meals.  Working too  much or putting off tasks.  Smoking, drinking alcohol, or using drugs to feel better. How is this diagnosed? Stress is diagnosed through an assessment by your health care provider. He or she may diagnose this condition based on:  Your symptoms and any stressful life events.  Your medical history.  Tests to rule out other causes of your symptoms. Depending on your condition, your health care provider may refer you to a specialist for further evaluation. How is this treated?  Stress management techniques are the recommended treatment for stress. Medicine is not typically recommended for the treatment of stress. Techniques to reduce your reaction to stressful life events include:  Stress identification. Monitor yourself for symptoms of stress and identify what causes stress for you. These skills may help you to avoid or prepare for stressful events.  Time management. Set your priorities, keep a calendar of events, and learn to say no. Taking these actions can help you avoid making too many commitments. Techniques for coping with stress include:  Rethinking the problem. Try to think realistically about stressful events rather than ignoring them or overreacting. Try to find the positives in a stressful situation rather than focusing on the negatives.  Exercise. Physical exercise can release both physical and emotional tension. The key is to find a form of exercise that you enjoy and do it regularly.  Relaxation techniques. These relax the body and mind. The key is to find one or more that you enjoy and use the techniques regularly. Examples include: ? Meditation, deep breathing, or progressive relaxation techniques. ? Yoga or   tai chi. ? Biofeedback, mindfulness techniques, or journaling. ? Listening to music, being out in nature, or participating in other hobbies.  Practicing a healthy lifestyle. Eat a balanced diet, drink plenty of water, limit or avoid caffeine, and get plenty of  sleep.  Having a strong support network. Spend time with family, friends, or other people you enjoy being around. Express your feelings and talk things over with someone you trust. Counseling or talk therapy with a mental health professional may be helpful if you are having trouble managing stress on your own. Follow these instructions at home: Lifestyle   Avoid drugs.  Do not use any products that contain nicotine or tobacco, such as cigarettes, e-cigarettes, and chewing tobacco. If you need help quitting, ask your health care provider.  Limit alcohol intake to no more than 1 drink a day for nonpregnant women and 2 drinks a day for men. One drink equals 12 oz of beer, 5 oz of wine, or 1 oz of hard liquor  Do not use alcohol or drugs to relax.  Eat a balanced diet that includes fresh fruits and vegetables, whole grains, lean meats, fish, eggs, and beans, and low-fat dairy. Avoid processed foods and foods high in added fat, sugar, and salt.  Exercise at least 30 minutes on 5 or more days each week.  Get 7-8 hours of sleep each night. General instructions   Practice stress management techniques as discussed with your health care provider.  Drink enough fluid to keep your urine clear or pale yellow.  Take over-the-counter and prescription medicines only as told by your health care provider.  Keep all follow-up visits as told by your health care provider. This is important. Contact a health care provider if:  Your symptoms get worse.  You have new symptoms.  You feel overwhelmed by your problems and can no longer manage them on your own. Get help right away if:  You have thoughts of hurting yourself or others. If you ever feel like you may hurt yourself or others, or have thoughts about taking your own life, get help right away. You can go to your nearest emergency department or call:  Your local emergency services (911 in the [place]  A suicide crisis helpline, such as the  [place] at (316) 250-6172. This is open 24 hours a day. Summary  Stress is a normal reaction to life events. It can cause problems if it happens too often or for too long.  Practicing stress management techniques is the best way to treat stress.  Counseling or talk therapy with a mental health professional may be helpful if you are having trouble managing stress on your own. This information is not intended to replace advice given to you by your health care provider. Make sure you discuss any questions you have with your health care provider. Document Revised: 04/08/2019 Document Reviewed: 10/29/2016 Elsevier Patient Education  [place].

## 2020-03-01 NOTE — Progress Notes (Signed)
Subjective:    Patient ID: Jasmine White, female    DOB: 1945-10-20, 74 y.o.   MRN: [id number]   Chief Complaint: Medical Management of Chronic Issues    HPI:  1. Essential hypertension, benign No c/o chest pain, sob or headache. Does not check blood pressure at home. BP Readings from Last 3 Encounters:  03/01/20 133/76  09/02/19 120/72  04/02/19 123/72     2. Hyperlipidemia with target LDL less than 130 Doe watch diet and stays very active. Lab Results  Component Value Date   CHOL 159 09/02/2019   HDL 53 09/02/2019   LDLCALC 91 09/02/2019   TRIG 80 09/02/2019   CHOLHDL 3.0 09/02/2019     3. Hypothyroidism due to acquired atrophy of thyroid No problem that aware of  4. Primary insomnia Take elavil which work well to help her sleep  5. Recurrent major depressive disorder, in full remission [facility]) is going through a rough time taking care of her husband and her mother. Depression screen [facility] 2/9 03/01/2020 09/02/2019 03/03/2019  Decreased Interest 1 0 0  Down, Depressed, Hopeless 1 0 0  PHQ - 2 Score 2 0 0  Altered sleeping 2 - -  Tired, decreased energy 2 - -  Change in appetite 1 - -  Feeling bad or failure about yourself  1 - -  Trouble concentrating 0 - -  Moving slowly or fidgety/restless 0 - -  Suicidal thoughts 1 - -  PHQ-9 Score 9 - -     6. Chronic midline low back pain without sciatica Pain assessment: Cause of pain- DDD Pain location- low back Pain on scale of 1-10- 5/10 currently Frequency- not daily What increases pain-to much activity What makes pain Better-ret Effects on ADL - none Any change in general medical condition-none  Current opioids rx- norco 5/325 # meds rx- 40 Effectiveness of current meds-help when needed Adverse reactions form pain meds-none Morphine equivalent- 20MEDD  Pill count performed-No Last drug screen - none ( high risk q46m moderate risk q637mlow risk yearly ) Urine drug screen today- No- patient only take  prn [place]  If yes were their any concerning findings? - no   Overdose risk: 1    Pain contract signed on: not signed only take prn   7. BMI 27.0-27.9,adult No recent weight chnages Wt Readings from Last 3 Encounters:  03/01/20 136 lb (61.7 kg)  09/02/19 133 lb (60.3 kg)  04/02/19 146 lb (66.2 kg)   BMI Readings from Last 3 Encounters:  03/01/20 21.95 kg/m  09/02/19 21.47 kg/m  04/02/19 23.57 kg/m       Outpatient Encounter Medications as of 03/01/2020  Medication Sig  . amitriptyline (ELAVIL) 50 MG tablet Take 1 tablet (50 mg total) by mouth at bedtime.  . Coenzyme Q10 (CO Q-10) 200 MG CAPS Take 1 capsule by mouth daily.  . Garlic 204268G CAPS Take 1 capsule by mouth daily. Take 1000 mg daily  . HYDROcodone-acetaminophen (NORCO/VICODIN) 5-325 MG tablet Take 1 tablet by mouth every 6 (six) hours as needed for moderate pain.  . Marland Kitchenosartan-hydrochlorothiazide (HYZAAR) 100-12.5 MG tablet Take 1 tablet by mouth daily.  . Lutein 20 MG CAPS Take 1 capsule by mouth daily.  . metoprolol succinate (TOPROL-XL) 100 MG 24 hr tablet Take 1 tablet (100 mg total) by mouth daily. Take with or immediately following a meal.  . Omega-3 Fatty Acids (FISH OIL) 1200 MG CAPS Take by mouth.  . Saline (  SIMPLY SALINE) 0.9 % AERS Place 1 spray into the nose daily as needed. For sinuses  . SYNTHROID 88 MCG tablet TAKE 1 TABLET BY MOUTH ONCE DAILY BEFORE BREAKFAST    Past Surgical History:  Procedure Laterality Date  . ABDOMINAL HYSTERECTOMY    . BACK SURGERY    . basal cell skin cancer     on face and forehead  . bil knee replacements    . BREAST BIOPSY     left breast/benign  . COLONOSCOPY    . LUMBAR LAMINECTOMY/DECOMPRESSION MICRODISCECTOMY  08/13/2012   Procedure: LUMBAR LAMINECTOMY/DECOMPRESSION MICRODISCECTOMY 1 LEVEL;  Surgeon: Erline Levine, MD;  Location: [facility];  Service: Neurosurgery;  Laterality: Left;  Left lumbar one-two Microdiskectomy    Family  History  Problem Relation Age of Onset  . Hypertension Mother   . Heart disease Mother   . Stroke Father   . Heart disease Father   . Alcohol abuse Father   . Arthritis Brother   . Cancer Brother 9       prostate  . Arthritis Brother   . Colon cancer Neg Hx   . Colon polyps Neg Hx   . Stomach cancer Neg Hx   . Rectal cancer Neg Hx     New complaints: None today  Social history: Trying to put her mom in assisted living     Review of Systems  Constitutional: Negative for diaphoresis.  Eyes: Negative for pain.  Respiratory: Negative for shortness of breath.   Cardiovascular: Negative for chest pain, palpitations and leg swelling.  Gastrointestinal: Negative for abdominal pain.  Endocrine: Negative for polydipsia.  Musculoskeletal: Positive for back pain (intermittent).  Skin: Negative for rash.  Neurological: Negative for dizziness, weakness and headaches.  Hematological: Does not bruise/bleed easily.  All other systems reviewed and are negative.      Objective:   Physical Exam Vitals and nursing note reviewed.  Constitutional:      General: She is not in acute distress.    Appearance: Normal appearance. She is well-developed.  HENT:     Head: Normocephalic.     Nose: Nose normal.  Eyes:     Pupils: Pupils are equal, White, and reactive to light.  Neck:     Vascular: No carotid bruit or JVD.  Cardiovascular:     Rate and Rhythm: Normal rate and regular rhythm.     Heart sounds: Normal heart sounds.  Pulmonary:     Effort: Pulmonary effort is normal. No respiratory distress.     Breath sounds: Normal breath sounds. No wheezing or rales.  Chest:     Chest wall: No tenderness.  Abdominal:     General: Bowel sounds are normal. There is no distension or abdominal bruit.     Palpations: Abdomen is soft. There is no hepatomegaly, splenomegaly, mass or pulsatile mass.     Tenderness: There is no abdominal tenderness.  Musculoskeletal:        General: Normal  range of motion.     Cervical back: Normal range of motion and neck supple.  Lymphadenopathy:     Cervical: No cervical adenopathy.  Skin:    General: Skin is warm and dry.  Neurological:     Mental Status: She is alert and oriented to person, place, and time.     Deep Tendon Reflexes: Reflexes are normal and symmetric.  Psychiatric:        Behavior: Behavior normal.        Thought Content: Thought content  normal.        Judgment: Judgment normal.    BP 133/76   Pulse 70   Temp (!) 97.1 F (36.2 C) (Temporal)   Resp 20   Ht _0  (1.676 m)   Wt 136 lb (61.7 kg)   SpO2 100%   BMI 21.95 kg/m         Assessment & Plan:  JISEL FLEET comes in today with chief complaint of Medical Management of Chronic Issues   Diagnosis and orders addressed:  1. Essential hypertension, benign Low sodium diet - metoprolol succinate (TOPROL-XL) 100 MG 24 hr tablet; Take 1 tablet (100 mg total) by mouth daily. Take with or immediately following a meal.  Dispense: 90 tablet; Refill: 1 - losartan-hydrochlorothiazide (HYZAAR) 100-12.5 MG tablet; Take 1 tablet by mouth daily.  Dispense: 90 tablet; Refill: 1  2. Hyperlipidemia with target LDL less than 130 Low fat diet  3. Hypothyroidism due to acquired atrophy of thyroid Labs pending - SYNTHROID 88 MCG tablet; TAKE 1 TABLET BY MOUTH ONCE DAILY BEFORE BREAKFAST  Dispense: 90 tablet; Refill: 1  4. Primary insomnia Bedtime routine  5. Recurrent major depressive disorder, in full remission ([place]) stress management  6. Chronic midline low back pain without sciatica Back stretches - HYDROcodone-acetaminophen (NORCO/VICODIN) 5-325 MG tablet; Take 1 tablet by mouth every 6 (six) hours as needed for moderate pain.  Dispense: 40 tablet; Refill: 0  7. BMI 27.0-27.9,adult Discussed diet and exercise for person with BMI >25 Will recheck weight in 3-6 months    Labs pending Health Maintenance reviewed Diet and exercise encouraged  Follow  up plan: 6 month   [place], FNP

## 2020-03-01 NOTE — Addendum Note (Signed)
Addended by: Chevis Pretty on: 03/01/2020 09:23 AM   Modules accepted: Orders

## 2020-03-02 ENCOUNTER — Ambulatory Visit: Payer: Self-pay | Admitting: Nurse Practitioner

## 2020-03-02 LAB — LIPID PANEL
Chol/HDL Ratio: 2.6 ratio (ref 0.0–4.4)
Cholesterol, Total: 173 mg/dL (ref 100–199)
HDL: 67 mg/dL (ref >39)
LDL Chol Calc (NIH): 94 mg/dL (ref 0–99)
Triglycerides: 59 mg/dL (ref 0–149)
VLDL Cholesterol Cal: 12 mg/dL (ref 5–40)

## 2020-03-02 LAB — CBC WITH DIFFERENTIAL/PLATELET
Basophils Absolute: 0.1 10*3/uL (ref 0.0–0.2)
Basos: 1 %
EOS (ABSOLUTE): 0.2 10*3/uL (ref 0.0–0.4)
Eos: 2 %
Hematocrit: 28.8 % — ABNORMAL LOW (ref 34.0–46.6)
Hemoglobin: 9.7 g/dL — ABNORMAL LOW (ref 11.1–15.9)
Immature Grans (Abs): 0 10*3/uL (ref 0.0–0.1)
Immature Granulocytes: 0 %
Lymphocytes Absolute: 1.3 10*3/uL (ref 0.7–3.1)
Lymphs: 10 %
MCH: 26.7 pg (ref 26.6–33.0)
MCHC: 33.7 g/dL (ref 31.5–35.7)
MCV: 79 fL (ref 79–97)
Monocytes Absolute: 0.9 10*3/uL (ref 0.1–0.9)
Monocytes: 7 %
Neutrophils Absolute: 10 10*3/uL — ABNORMAL HIGH (ref 1.4–7.0)
Neutrophils: 80 %
Platelets: 397 10*3/uL (ref 150–450)
RBC: 3.63 x10E6/uL — ABNORMAL LOW (ref 3.77–5.28)
RDW: 14.7 % (ref 11.7–15.4)
WBC: 12.6 10*3/uL — ABNORMAL HIGH (ref 3.4–10.8)

## 2020-03-02 LAB — CMP14+EGFR
ALT: 10 IU/L (ref 0–32)
AST: 25 IU/L (ref 0–40)
Albumin/Globulin Ratio: 1.1 — ABNORMAL LOW (ref 1.2–2.2)
Albumin: 3.9 g/dL (ref 3.7–4.7)
Alkaline Phosphatase: 90 IU/L (ref 48–121)
BUN/Creatinine Ratio: 29 — ABNORMAL HIGH (ref 12–28)
BUN: 67 mg/dL — ABNORMAL HIGH (ref 8–27)
Bilirubin Total: 0.3 mg/dL (ref 0.0–1.2)
CO2: 24 mmol/L (ref 20–29)
Calcium: 9.6 mg/dL (ref 8.7–10.3)
Chloride: 85 mmol/L — ABNORMAL LOW (ref 96–106)
Creatinine, Ser: 2.28 mg/dL — ABNORMAL HIGH (ref 0.57–1.00)
GFR calc Af Amer: 24 mL/min/{1.73_m2} — ABNORMAL LOW (ref >59)
GFR calc non Af Amer: 21 mL/min/{1.73_m2} — ABNORMAL LOW (ref >59)
Globulin, Total: 3.5 g/dL (ref 1.5–4.5)
Glucose: 90 mg/dL (ref 65–99)
Potassium: 3.7 mmol/L (ref 3.5–5.2)
Sodium: 128 mmol/L — ABNORMAL LOW (ref 134–144)
Total Protein: 7.4 g/dL (ref 6.0–8.5)

## 2020-03-02 LAB — THYROID PANEL WITH TSH
Free Thyroxine Index: 3.9 (ref 1.2–4.9)
T3 Uptake Ratio: 36 % (ref 24–39)
T4, Total: 10.9 ug/dL (ref 4.5–12.0)
TSH: 0.573 u[IU]/mL (ref 0.450–4.500)

## 2020-03-05 ENCOUNTER — Telehealth: Payer: Self-pay | Admitting: Nurse Practitioner

## 2020-03-05 DIAGNOSIS — R7989 Other specified abnormal findings of blood chemistry: Secondary | ICD-10-CM

## 2020-03-05 DIAGNOSIS — E871 Hypo-osmolality and hyponatremia: Secondary | ICD-10-CM

## 2020-03-05 NOTE — Telephone Encounter (Signed)
Aware of results.  Urgent referral placed to nephrology

## 2020-03-08 ENCOUNTER — Telehealth: Payer: Self-pay | Admitting: Nurse Practitioner

## 2020-03-08 NOTE — Telephone Encounter (Signed)
Pt aware of lab work & referral to Nephrology

## 2020-03-17 ENCOUNTER — Encounter (HOSPITAL_BASED_OUTPATIENT_CLINIC_OR_DEPARTMENT_OTHER): Payer: Self-pay | Admitting: Emergency Medicine

## 2020-03-17 ENCOUNTER — Other Ambulatory Visit: Payer: Self-pay

## 2020-03-17 ENCOUNTER — Emergency Department (HOSPITAL_BASED_OUTPATIENT_CLINIC_OR_DEPARTMENT_OTHER): Payer: Medicare PPO

## 2020-03-17 ENCOUNTER — Inpatient Hospital Stay (HOSPITAL_BASED_OUTPATIENT_CLINIC_OR_DEPARTMENT_OTHER)
Admission: EM | Admit: 2020-03-17 | Discharge: 2020-04-10 | DRG: 673 | Disposition: A | Discharge status: Home Health | Payer: Medicare PPO | Attending: Critical Care Medicine | Admitting: Critical Care Medicine

## 2020-03-17 DIAGNOSIS — N390 Urinary tract infection, site not specified: Secondary | ICD-10-CM | POA: Diagnosis present

## 2020-03-17 DIAGNOSIS — R1110 Vomiting, unspecified: Secondary | ICD-10-CM

## 2020-03-17 DIAGNOSIS — K2970 Gastritis, unspecified, without bleeding: Secondary | ICD-10-CM | POA: Diagnosis present

## 2020-03-17 DIAGNOSIS — D631 Anemia in chronic kidney disease: Secondary | ICD-10-CM | POA: Diagnosis present

## 2020-03-17 DIAGNOSIS — Z9289 Personal history of other medical treatment: Secondary | ICD-10-CM

## 2020-03-17 DIAGNOSIS — Z85828 Personal history of other malignant neoplasm of skin: Secondary | ICD-10-CM

## 2020-03-17 DIAGNOSIS — Z0189 Encounter for other specified special examinations: Secondary | ICD-10-CM | POA: Diagnosis not present

## 2020-03-17 DIAGNOSIS — R0602 Shortness of breath: Secondary | ICD-10-CM

## 2020-03-17 DIAGNOSIS — Z931 Gastrostomy status: Secondary | ICD-10-CM | POA: Diagnosis not present

## 2020-03-17 DIAGNOSIS — K5900 Constipation, unspecified: Secondary | ICD-10-CM | POA: Diagnosis present

## 2020-03-17 DIAGNOSIS — E039 Hypothyroidism, unspecified: Secondary | ICD-10-CM | POA: Diagnosis present

## 2020-03-17 DIAGNOSIS — K828 Other specified diseases of gallbladder: Secondary | ICD-10-CM | POA: Diagnosis not present

## 2020-03-17 DIAGNOSIS — R918 Other nonspecific abnormal finding of lung field: Secondary | ICD-10-CM | POA: Diagnosis not present

## 2020-03-17 DIAGNOSIS — I776 Arteritis, unspecified: Secondary | ICD-10-CM | POA: Diagnosis not present

## 2020-03-17 DIAGNOSIS — M549 Dorsalgia, unspecified: Secondary | ICD-10-CM | POA: Diagnosis present

## 2020-03-17 DIAGNOSIS — Z885 Allergy status to narcotic agent status: Secondary | ICD-10-CM

## 2020-03-17 DIAGNOSIS — D696 Thrombocytopenia, unspecified: Secondary | ICD-10-CM | POA: Diagnosis not present

## 2020-03-17 DIAGNOSIS — N179 Acute kidney failure, unspecified: Secondary | ICD-10-CM

## 2020-03-17 DIAGNOSIS — I288 Other diseases of pulmonary vessels: Secondary | ICD-10-CM | POA: Diagnosis present

## 2020-03-17 DIAGNOSIS — N170 Acute kidney failure with tubular necrosis: Secondary | ICD-10-CM | POA: Diagnosis not present

## 2020-03-17 DIAGNOSIS — G92 Toxic encephalopathy: Secondary | ICD-10-CM | POA: Diagnosis not present

## 2020-03-17 DIAGNOSIS — K8020 Calculus of gallbladder without cholecystitis without obstruction: Secondary | ICD-10-CM | POA: Diagnosis not present

## 2020-03-17 DIAGNOSIS — D849 Immunodeficiency, unspecified: Secondary | ICD-10-CM | POA: Diagnosis not present

## 2020-03-17 DIAGNOSIS — Z5181 Encounter for therapeutic drug level monitoring: Secondary | ICD-10-CM | POA: Diagnosis not present

## 2020-03-17 DIAGNOSIS — E785 Hyperlipidemia, unspecified: Secondary | ICD-10-CM | POA: Diagnosis present

## 2020-03-17 DIAGNOSIS — Z90710 Acquired absence of both cervix and uterus: Secondary | ICD-10-CM

## 2020-03-17 DIAGNOSIS — R7989 Other specified abnormal findings of blood chemistry: Secondary | ICD-10-CM

## 2020-03-17 DIAGNOSIS — J9690 Respiratory failure, unspecified, unspecified whether with hypoxia or hypercapnia: Secondary | ICD-10-CM

## 2020-03-17 DIAGNOSIS — R739 Hyperglycemia, unspecified: Secondary | ICD-10-CM | POA: Diagnosis not present

## 2020-03-17 DIAGNOSIS — N3289 Other specified disorders of bladder: Secondary | ICD-10-CM | POA: Diagnosis not present

## 2020-03-17 DIAGNOSIS — E861 Hypovolemia: Secondary | ICD-10-CM | POA: Diagnosis present

## 2020-03-17 DIAGNOSIS — I120 Hypertensive chronic kidney disease with stage 5 chronic kidney disease or end stage renal disease: Secondary | ICD-10-CM | POA: Diagnosis present

## 2020-03-17 DIAGNOSIS — E872 Acidosis: Secondary | ICD-10-CM | POA: Diagnosis not present

## 2020-03-17 DIAGNOSIS — J9601 Acute respiratory failure with hypoxia: Secondary | ICD-10-CM | POA: Diagnosis not present

## 2020-03-17 DIAGNOSIS — Z79890 Hormone replacement therapy (postmenopausal): Secondary | ICD-10-CM

## 2020-03-17 DIAGNOSIS — N019 Rapidly progressive nephritic syndrome with unspecified morphologic changes: Secondary | ICD-10-CM | POA: Diagnosis not present

## 2020-03-17 DIAGNOSIS — D509 Iron deficiency anemia, unspecified: Secondary | ICD-10-CM | POA: Diagnosis present

## 2020-03-17 DIAGNOSIS — R0489 Hemorrhage from other sites in respiratory passages: Secondary | ICD-10-CM | POA: Diagnosis not present

## 2020-03-17 DIAGNOSIS — Z992 Dependence on renal dialysis: Secondary | ICD-10-CM

## 2020-03-17 DIAGNOSIS — Z8249 Family history of ischemic heart disease and other diseases of the circulatory system: Secondary | ICD-10-CM

## 2020-03-17 DIAGNOSIS — G4700 Insomnia, unspecified: Secondary | ICD-10-CM | POA: Diagnosis present

## 2020-03-17 DIAGNOSIS — R351 Nocturia: Secondary | ICD-10-CM | POA: Diagnosis present

## 2020-03-17 DIAGNOSIS — R112 Nausea with vomiting, unspecified: Secondary | ICD-10-CM

## 2020-03-17 DIAGNOSIS — J811 Chronic pulmonary edema: Secondary | ICD-10-CM | POA: Diagnosis not present

## 2020-03-17 DIAGNOSIS — Z20822 Contact with and (suspected) exposure to covid-19: Secondary | ICD-10-CM | POA: Diagnosis not present

## 2020-03-17 DIAGNOSIS — Z8616 Personal history of COVID-19: Secondary | ICD-10-CM | POA: Diagnosis not present

## 2020-03-17 DIAGNOSIS — Z8261 Family history of arthritis: Secondary | ICD-10-CM

## 2020-03-17 DIAGNOSIS — N186 End stage renal disease: Secondary | ICD-10-CM | POA: Diagnosis not present

## 2020-03-17 DIAGNOSIS — D649 Anemia, unspecified: Secondary | ICD-10-CM | POA: Diagnosis not present

## 2020-03-17 DIAGNOSIS — N171 Acute kidney failure with acute cortical necrosis: Secondary | ICD-10-CM | POA: Diagnosis not present

## 2020-03-17 DIAGNOSIS — R001 Bradycardia, unspecified: Secondary | ICD-10-CM | POA: Diagnosis not present

## 2020-03-17 DIAGNOSIS — R188 Other ascites: Secondary | ICD-10-CM | POA: Diagnosis not present

## 2020-03-17 DIAGNOSIS — M1990 Unspecified osteoarthritis, unspecified site: Secondary | ICD-10-CM | POA: Diagnosis present

## 2020-03-17 DIAGNOSIS — D6489 Other specified anemias: Secondary | ICD-10-CM | POA: Diagnosis present

## 2020-03-17 DIAGNOSIS — Z4901 Encounter for fitting and adjustment of extracorporeal dialysis catheter: Secondary | ICD-10-CM | POA: Diagnosis not present

## 2020-03-17 DIAGNOSIS — K429 Umbilical hernia without obstruction or gangrene: Secondary | ICD-10-CM | POA: Diagnosis not present

## 2020-03-17 DIAGNOSIS — J90 Pleural effusion, not elsewhere classified: Secondary | ICD-10-CM

## 2020-03-17 DIAGNOSIS — Z79899 Other long term (current) drug therapy: Secondary | ICD-10-CM

## 2020-03-17 DIAGNOSIS — I700 Atherosclerosis of aorta: Secondary | ICD-10-CM | POA: Diagnosis not present

## 2020-03-17 DIAGNOSIS — I5023 Acute on chronic systolic (congestive) heart failure: Secondary | ICD-10-CM | POA: Diagnosis not present

## 2020-03-17 DIAGNOSIS — J439 Emphysema, unspecified: Secondary | ICD-10-CM | POA: Diagnosis not present

## 2020-03-17 DIAGNOSIS — E222 Syndrome of inappropriate secretion of antidiuretic hormone: Secondary | ICD-10-CM | POA: Diagnosis not present

## 2020-03-17 DIAGNOSIS — G8929 Other chronic pain: Secondary | ICD-10-CM | POA: Diagnosis present

## 2020-03-17 DIAGNOSIS — E871 Hypo-osmolality and hyponatremia: Secondary | ICD-10-CM

## 2020-03-17 DIAGNOSIS — R350 Frequency of micturition: Secondary | ICD-10-CM | POA: Diagnosis present

## 2020-03-17 DIAGNOSIS — Z01810 Encounter for preprocedural cardiovascular examination: Secondary | ICD-10-CM | POA: Diagnosis not present

## 2020-03-17 DIAGNOSIS — T380X5A Adverse effect of glucocorticoids and synthetic analogues, initial encounter: Secondary | ICD-10-CM | POA: Diagnosis not present

## 2020-03-17 DIAGNOSIS — N289 Disorder of kidney and ureter, unspecified: Secondary | ICD-10-CM | POA: Diagnosis not present

## 2020-03-17 DIAGNOSIS — N19 Unspecified kidney failure: Secondary | ICD-10-CM

## 2020-03-17 DIAGNOSIS — N184 Chronic kidney disease, stage 4 (severe): Secondary | ICD-10-CM | POA: Diagnosis not present

## 2020-03-17 DIAGNOSIS — N185 Chronic kidney disease, stage 5: Secondary | ICD-10-CM | POA: Diagnosis not present

## 2020-03-17 DIAGNOSIS — Z978 Presence of other specified devices: Secondary | ICD-10-CM

## 2020-03-17 DIAGNOSIS — J9811 Atelectasis: Secondary | ICD-10-CM | POA: Diagnosis not present

## 2020-03-17 DIAGNOSIS — E860 Dehydration: Secondary | ICD-10-CM | POA: Diagnosis present

## 2020-03-17 DIAGNOSIS — I129 Hypertensive chronic kidney disease with stage 1 through stage 4 chronic kidney disease, or unspecified chronic kidney disease: Secondary | ICD-10-CM | POA: Diagnosis not present

## 2020-03-17 DIAGNOSIS — I7789 Other specified disorders of arteries and arterioles: Secondary | ICD-10-CM | POA: Diagnosis present

## 2020-03-17 DIAGNOSIS — Z96653 Presence of artificial knee joint, bilateral: Secondary | ICD-10-CM | POA: Diagnosis present

## 2020-03-17 DIAGNOSIS — R911 Solitary pulmonary nodule: Secondary | ICD-10-CM | POA: Diagnosis not present

## 2020-03-17 DIAGNOSIS — R1310 Dysphagia, unspecified: Secondary | ICD-10-CM | POA: Diagnosis present

## 2020-03-17 LAB — COMPREHENSIVE METABOLIC PANEL
ALT: 13 U/L (ref 0–44)
AST: 19 U/L (ref 15–41)
Albumin: 3.2 g/dL — ABNORMAL LOW (ref 3.5–5.0)
Alkaline Phosphatase: 71 U/L (ref 38–126)
Anion gap: 20 — ABNORMAL HIGH (ref 5–15)
BUN: 118 mg/dL — ABNORMAL HIGH (ref 8–23)
CO2: 21 mmol/L — ABNORMAL LOW (ref 22–32)
Calcium: 8.7 mg/dL — ABNORMAL LOW (ref 8.9–10.3)
Chloride: 68 mmol/L — ABNORMAL LOW (ref 98–111)
Creatinine, Ser: 7.12 mg/dL — ABNORMAL HIGH (ref 0.44–1.00)
GFR calc Af Amer: 6 mL/min — ABNORMAL LOW (ref >60)
GFR calc non Af Amer: 5 mL/min — ABNORMAL LOW (ref >60)
Glucose, Bld: 102 mg/dL — ABNORMAL HIGH (ref 70–99)
Potassium: 3.5 mmol/L (ref 3.5–5.1)
Sodium: 109 mmol/L — CL (ref 135–145)
Total Bilirubin: 0.7 mg/dL (ref 0.3–1.2)
Total Protein: 7.8 g/dL (ref 6.5–8.1)

## 2020-03-17 LAB — URINALYSIS, ROUTINE W REFLEX MICROSCOPIC
Bilirubin Urine: NEGATIVE
Glucose, UA: NEGATIVE mg/dL
Ketones, ur: NEGATIVE mg/dL
Nitrite: NEGATIVE
Protein, ur: 100 mg/dL — AB
Specific Gravity, Urine: 1.015 (ref 1.005–1.030)
pH: 5.5 (ref 5.0–8.0)

## 2020-03-17 LAB — BASIC METABOLIC PANEL
Anion gap: 19 — ABNORMAL HIGH (ref 5–15)
BUN: 116 mg/dL — ABNORMAL HIGH (ref 8–23)
CO2: 20 mmol/L — ABNORMAL LOW (ref 22–32)
Calcium: 8.1 mg/dL — ABNORMAL LOW (ref 8.9–10.3)
Chloride: 73 mmol/L — ABNORMAL LOW (ref 98–111)
Creatinine, Ser: 7.25 mg/dL — ABNORMAL HIGH (ref 0.44–1.00)
GFR calc Af Amer: 6 mL/min — ABNORMAL LOW (ref >60)
GFR calc non Af Amer: 5 mL/min — ABNORMAL LOW (ref >60)
Glucose, Bld: 95 mg/dL (ref 70–99)
Potassium: 3.4 mmol/L — ABNORMAL LOW (ref 3.5–5.1)
Sodium: 112 mmol/L — CL (ref 135–145)

## 2020-03-17 LAB — URINALYSIS, MICROSCOPIC (REFLEX)

## 2020-03-17 LAB — CBC
HCT: 26.8 % — ABNORMAL LOW (ref 36.0–46.0)
Hemoglobin: 9.5 g/dL — ABNORMAL LOW (ref 12.0–15.0)
MCH: 26.8 pg (ref 26.0–34.0)
MCHC: 35.4 g/dL (ref 30.0–36.0)
MCV: 75.7 fL — ABNORMAL LOW (ref 80.0–100.0)
Platelets: 429 K/uL — ABNORMAL HIGH (ref 150–400)
RBC: 3.54 MIL/uL — ABNORMAL LOW (ref 3.87–5.11)
RDW: 13.1 % (ref 11.5–15.5)
WBC: 12.5 K/uL — ABNORMAL HIGH (ref 4.0–10.5)
nRBC: 0 % (ref 0.0–0.2)

## 2020-03-17 LAB — TSH: TSH: 1.209 u[IU]/mL (ref 0.350–4.500)

## 2020-03-17 LAB — LIPASE, BLOOD: Lipase: 94 U/L — ABNORMAL HIGH (ref 11–51)

## 2020-03-17 LAB — OSMOLALITY, URINE: Osmolality, Ur: 273 mosm/kg — ABNORMAL LOW (ref 300–900)

## 2020-03-17 LAB — CREATININE, URINE, RANDOM: Creatinine, Urine: 44.13 mg/dL

## 2020-03-17 LAB — OSMOLALITY: Osmolality: 275 mOsm/kg (ref 275–295)

## 2020-03-17 LAB — SARS CORONAVIRUS 2 BY RT PCR (HOSPITAL ORDER, PERFORMED IN ~~LOC~~ HOSPITAL LAB): SARS Coronavirus 2: NEGATIVE

## 2020-03-17 LAB — SODIUM, URINE, RANDOM: Sodium, Ur: 24 mmol/L

## 2020-03-17 MED ORDER — SODIUM CHLORIDE 0.9 % IV BOLUS
1000.0000 mL | Freq: Once | INTRAVENOUS | Status: AC
  Administered 2020-03-17: 1000 mL via INTRAVENOUS

## 2020-03-17 MED ORDER — SODIUM CHLORIDE 0.9 % IV SOLN: INTRAVENOUS | Status: DC

## 2020-03-17 MED ORDER — ONDANSETRON 4 MG PO TBDP
4.0000 mg | ORAL_TABLET | Freq: Once | ORAL | Status: AC | PRN
  Administered 2020-03-17: 4 mg via ORAL
  Filled 2020-03-17: qty 1

## 2020-03-17 NOTE — ED Triage Notes (Addendum)
Vomiting x 1 week. Denies pain. Also had some lab work done 1 week ago found to have low sodium, elevated BUN and CRT

## 2020-03-17 NOTE — ED Provider Notes (Signed)
[facility] EMERGENCY DEPARTMENT Provider Note   CSN: [id number] Arrival date & time: 03/17/20  1803     History Chief Complaint  Patient presents with  . Emesis    Jasmine White is a 74 y.o. female.  74 year old female with past medical history below including chronic back pain, hypertension, hyperlipidemia, hypothyroidism who presents with vomiting.  Earlier this month, patient had lab work that showed elevated BUN and creatinine.  She was told that she would need referral to a nephrologist but she has not had any follow-up yet.  9 days ago, she began having intermittent vomiting that has worsened to the point that she cannot keep anything down including liquids.  She denies any associated abdominal pain.  The last bowel movement was yesterday.  She is passing flatus.  She denies any headache or vision changes.  She reports severe fatigue and dizziness described as lightheadedness.  She denies any fevers or URI symptoms.  No chest pain or shortness of breath.  The history is provided by the patient.  Emesis      Past Medical History:  Diagnosis Date  . Arthritis   . Bruises easily   . Cancer ([place])    basal skin cancer  . Chronic back pain    HNP/stenosis and radiculopathy  . Hyperlipidemia    takes Pravastatin daily  . Hypertension    takes Hyzaar and toprol daily  . Hypothyroidism    takes Synthroid daily  . Insomnia    takes Elevil nightly  . Joint pain   . Joint swelling   . Low BP    past some sedation  . Nocturia   . PONV (postoperative nausea and vomiting)    with knee replacement b/p dropped  . Sinusitis    finished zpak yesterday  . Urinary frequency     Patient Active Problem List   Diagnosis Date Noted  . Primary insomnia 01/20/2019  . BMI 27.0-27.9,adult 06/29/2015  . Chronic back pain 07/06/2014  . Essential hypertension, benign 12/21/2012  . Hyperlipidemia with target LDL less than 130 12/21/2012  . Hypothyroidism 12/21/2012  .  Depression 12/21/2012    Past Surgical History:  Procedure Laterality Date  . ABDOMINAL HYSTERECTOMY    . BACK SURGERY    . basal cell skin cancer     on face and forehead  . bil knee replacements    . BREAST BIOPSY     left breast/benign  . COLONOSCOPY    . LUMBAR LAMINECTOMY/DECOMPRESSION MICRODISCECTOMY  08/13/2012   Procedure: LUMBAR LAMINECTOMY/DECOMPRESSION MICRODISCECTOMY 1 LEVEL;  Surgeon: Erline Levine, MD;  Location: [facility];  Service: Neurosurgery;  Laterality: Left;  Left lumbar one-two Microdiskectomy     OB History   No obstetric history on file.     Family History  Problem Relation Age of Onset  . Hypertension Mother   . Heart disease Mother   . Stroke Father   . Heart disease Father   . Alcohol abuse Father   . Arthritis Brother   . Cancer Brother 33       prostate  . Arthritis Brother   . Colon cancer Neg Hx   . Colon polyps Neg Hx   . Stomach cancer Neg Hx   . Rectal cancer Neg Hx     Social History   Tobacco Use  . Smoking status: Never Smoker  . Smokeless tobacco: Never Used  Vaping Use  . Vaping Use: Never used  Substance Use Topics  .  Alcohol use: No    Alcohol/week: 0.0 standard drinks  . Drug use: No    Home Medications Prior to Admission medications   Medication Sig Start Date End Date Taking? Authorizing Provider  amitriptyline (ELAVIL) 50 MG tablet Take 1 tablet (50 mg total) by mouth at bedtime. 09/02/19   Hassell Done, [name], FNP  Coenzyme Q10 (CO Q-10) 200 MG CAPS Take 1 capsule by mouth daily.    [provider]  Garlic 9163 MG CAPS Take 1 capsule by mouth daily. Take 1000 mg daily    [provider]  HYDROcodone-acetaminophen (NORCO/VICODIN) 5-325 MG tablet Take 1 tablet by mouth every 6 (six) hours as needed for moderate pain. 03/01/20   Hassell Done, [name], FNP  losartan-hydrochlorothiazide (HYZAAR) 100-12.5 MG tablet Take 1 tablet by mouth daily. 03/01/20   Hassell Done, [name], FNP  Lutein 20 MG  CAPS Take 1 capsule by mouth daily.    [provider]  metoprolol succinate (TOPROL-XL) 100 MG 24 hr tablet Take 1 tablet (100 mg total) by mouth daily. Take with or immediately following a meal. 03/01/20   Hassell Done, [name], FNP  Omega-3 Fatty Acids (FISH OIL) 1200 MG CAPS Take by mouth.    [provider]  Saline (SIMPLY SALINE) 0.9 % AERS Place 1 spray into the nose daily as needed. For sinuses    [provider]  SYNTHROID 88 MCG tablet TAKE 1 TABLET BY MOUTH ONCE DAILY BEFORE BREAKFAST 03/01/20   Chevis Pretty, FNP    Allergies    Percocet [oxycodone-acetaminophen] and Ultram [tramadol]  Review of Systems   Review of Systems  Gastrointestinal: Positive for vomiting.   All other systems reviewed and are negative except that which was mentioned in HPI  Physical Exam Updated Vital Signs BP 137/90 (BP Location: Right Arm)   Pulse 96   Temp 98.4 F (36.9 C) (Oral)   Resp 14   Ht 5\' 4"  (1.626 m)   Wt 61.2 kg   SpO2 100%   BMI 23.17 kg/m   Physical Exam Vitals and nursing note reviewed.  Constitutional:      General: She is not in acute distress.    Appearance: She is well-developed.  HENT:     Head: Normocephalic and atraumatic.     Mouth/Throat:     Mouth: Mucous membranes are dry.  Eyes:     Conjunctiva/sclera: Conjunctivae normal.     Pupils: Pupils are equal, round, and reactive to light.  Cardiovascular:     Rate and Rhythm: Regular rhythm. Bradycardia present.     Heart sounds: Normal heart sounds. No murmur heard.   Pulmonary:     Effort: Pulmonary effort is normal.     Breath sounds: Normal breath sounds.  Abdominal:     General: Bowel sounds are normal. There is no distension.     Palpations: Abdomen is soft.     Tenderness: There is no abdominal tenderness.  Musculoskeletal:     Cervical back: Neck supple.  Skin:    General: Skin is warm and dry.  Neurological:     Mental Status: She is alert and oriented to  person, place, and time.     Comments: Fluent speech  Psychiatric:        Judgment: Judgment normal.     ED Results / Procedures / Treatments   Labs (all labs ordered are listed, but only abnormal results are displayed) Labs Reviewed  LIPASE, BLOOD - Abnormal; Notable for the following components:      Result  Value   Lipase 94 (*)    All other components within normal limits  COMPREHENSIVE METABOLIC PANEL - Abnormal; Notable for the following components:   Sodium 109 (*)    Chloride 68 (*)    CO2 21 (*)    Glucose, Bld 102 (*)    BUN 118 (*)    Creatinine, Ser 7.12 (*)    Calcium 8.7 (*)    Albumin 3.2 (*)    GFR calc non Af Amer 5 (*)    GFR calc Af Amer 6 (*)    Anion gap 20 (*)    All other components within normal limits  CBC - Abnormal; Notable for the following components:   WBC 12.5 (*)    RBC 3.54 (*)    Hemoglobin 9.5 (*)    HCT 26.8 (*)    MCV 75.7 (*)    Platelets 429 (*)    All other components within normal limits  SARS CORONAVIRUS 2 BY RT PCR (HOSPITAL ORDER, [facility])  URINALYSIS, ROUTINE W REFLEX MICROSCOPIC  SODIUM, URINE, RANDOM  CREATININE, URINE, RANDOM  OSMOLALITY  TSH  OSMOLALITY, URINE    EKG EKG Interpretation  Date/Time:  Saturday March 17 2020 20:01:04 EDT Ventricular Rate:  66 PR Interval:    QRS Duration: 97 QT Interval:  434 QTC Calculation: 455 R Axis:   -9 Text Interpretation: Sinus rhythm Prolonged PR interval Low voltage, precordial leads Nonspecific T abnrm, anterolateral leads No significant change since last tracing Confirmed by Theotis Burrow [phone]) on 03/17/2020 8:06:29 PM   Radiology CT Abdomen Pelvis Wo Contrast  Result Date: 03/17/2020 CLINICAL DATA:  Vomiting for 1 week, low sella diam, elevated BUN and creatinine EXAM: CT ABDOMEN AND PELVIS WITHOUT CONTRAST TECHNIQUE: Multidetector CT imaging of the abdomen and pelvis was performed following the standard protocol without IV contrast.  COMPARISON:  Chest radiograph 12/26/2014, lumbar radiograph 08/13/2012, MR lumbar spine 06/16/2012 FINDINGS: Lower chest: Scarring and reticular changes noted in the lung bases, left greater than right. No consolidative opacity or pleural effusion. Normal heart size. No pericardial effusion. Tiny benign appearing macrocalcification versus fiducial in the right breast. Hepatobiliary: No visible focal liver lesions. Smooth liver surface contour. Normal hepatic attenuation. Gallbladder is moderately distended. Some focal thickening is noted towards the gallbladder fundus, possible adenomyomatosis. A partially calcified gallstone noted layering dependently as well. No pericholecystic fluid or inflammation. Normal caliber biliary tree without visible calcified intraductal gallstones. Pancreas: Moderate pancreatic atrophy. No inflammation or ductal dilatation. No discernible pancreatic lesions. Spleen: Normal in size without focal abnormality. Adrenals/Urinary Tract: No concerning adrenal lesions. No visible or contour deforming renal lesions. No urolithiasis or hydronephrosis. Urinary bladder is moderately distended with layering air-fluid level. No significant pericholecystic inflammation or urinary bladder wall thickening. Stomach/Bowel: Question some mild thickening versus underdistention of the stomach. Duodenum takes a normal course across the midline abdomen. No small bowel thickening or dilatation. Large volume of inspissated fecal material noted throughout the colon. Question some mild distal colonic thickening versus underdistention of the sigmoid. No adjacent inflammation, free fluid or evidence of perforation. No high-grade bowel obstruction. Vascular/Lymphatic: Atherosclerotic calcifications throughout the abdominal aorta and branch vessels. No aneurysm or ectasia. No enlarged abdominopelvic lymph nodes. Reproductive: Uterus is surgically absent. No concerning adnexal lesions. Other: No abdominopelvic free  fluid or free gas. No bowel containing hernias. Small fat containing umbilical hernia. Musculoskeletal: Prior L3-L5 posterior spinal fusion. No evidence of hardware failure or acute complication is seen. Mild anterior  wedging and Schmorl's node at the L1 superior endplate is similar to comparison radiographs. Diffuse discogenic and facet degenerative changes noted throughout the lumbar spine at both instrumented in non instrumented levels. Additional degenerative changes in the hips and pelvis. The osseous structures appear diffusely demineralized which may limit detection of small or nondisplaced fractures. No acute or worrisome osseous lesions. IMPRESSION: 1. Large volume of inspissated fecal material noted throughout the colon. Mild distal colonic thickening versus underdistention of the sigmoid without evidence of inflammation. Correlate for features of constipation. Furthermore, could consider correlation with most recent colonoscopy or outpatient visualization if not recently performed. 2. Similar thickening versus underdistention of the stomach, could reflect gastritis or secondary change given patient's prolonged emesis. 3. Moderately distended urinary bladder with layering air-fluid level. Could reflect recent instrumentation/catheterization, however if urinary symptoms are present, recommend further evaluation with urinalysis to exclude cystitis. 4. Cholelithiasis without evidence of acute cholecystitis. Mild focal thickening at the gallbladder fundus is suspected to reflect adenomyomatosis but could correlate with outpatient right upper quadrant ultrasound. Could perform on acute basis if biliary symptoms are present. 5. Aortic Atherosclerosis (ICD10-I70.0). Electronically Signed   By: Lovena Le M.D.   On: 03/17/2020 19:37    Procedures .Critical Care Performed by: Sharlett Iles, MD Authorized by: Sharlett Iles, MD   Critical care provider statement:    Critical care time  (minutes):  30   Critical care was necessary to treat or prevent imminent or life-threatening deterioration of the following conditions:  Renal failure   Critical care was time spent personally by me on the following activities:  Development of treatment plan with patient or surrogate, discussions with consultants, evaluation of patient's response to treatment, examination of patient, obtaining history from patient or surrogate, ordering and performing treatments and interventions, ordering and review of laboratory studies, ordering and review of radiographic studies, re-evaluation of patient's condition and review of old charts   (including critical care time)  Medications Ordered in ED Medications  ondansetron (ZOFRAN-ODT) disintegrating tablet 4 mg (4 mg Oral Given 03/17/20 1814)  sodium chloride 0.9 % bolus 1,000 mL (1,000 mLs Intravenous New Bag/Given 03/17/20 1917)    ED Course  I have reviewed the triage vital signs and the nursing notes.  Pertinent labs & imaging results that were available during my care of the patient were reviewed by me and considered in my medical decision making (see chart for details).    MDM Rules/Calculators/A&P                          Patient alert, oriented, nontoxic on exam. initial vital signs notable for hypertension.  No focal abdominal tenderness.  Ordered 1 L IV fluid bolus.  Lab work notable for sodium 109, potassium 3.5, chloride 68, CO2 21, glucose 102, BUN 118, creatinine 7.12, calcium 8.7, normal LFTs, anion gap 20, lipase 94, WBC 12.5, hemoglobin 9.5.  Obtain CT of abdomen pelvis which shows constipation, possible gastritis, bladder distention, cholelithiasis.  Patient was not able to urinate therefore placed Foley.   Patient's AKI seems multifactorial, with some component of volume depletion due to all the vomiting and another component of injury from continuing her antihypertensive medication.  I discussed with nephrologist on-call, Dr. Hollie Salk;  they will see the patient in consultation when she arrives at [facility].  Will hold at 1 L fluid bolus for now to avoid over rapid correction of sodium.  Discussed admission with [facility] hospitalist, Dr.  Mansy. Pt will be transferred to [facility].  Final Clinical Impression(s) / ED Diagnoses Final diagnoses:  Hyponatremia  Acute renal failure, unspecified acute renal failure type (HCC)  Intractable vomiting with nausea, unspecified vomiting type    Rx / DC Orders ED Discharge Orders    None       Ricahrd Schwager, Wenda Overland, MD 03/17/20 2026

## 2020-03-17 NOTE — ED Notes (Signed)
Foley inserted by adrienne peel, RN. I assisted with insertion

## 2020-03-17 NOTE — ED Notes (Signed)
Consult called to [facility]

## 2020-03-17 NOTE — ED Notes (Signed)
Report attempted x 1. Left message and phone number with NS.

## 2020-03-17 NOTE — ED Notes (Signed)
[facility] on site to transport.

## 2020-03-18 ENCOUNTER — Inpatient Hospital Stay (HOSPITAL_COMMUNITY): Payer: Medicare PPO

## 2020-03-18 DIAGNOSIS — N184 Chronic kidney disease, stage 4 (severe): Secondary | ICD-10-CM

## 2020-03-18 DIAGNOSIS — N390 Urinary tract infection, site not specified: Secondary | ICD-10-CM

## 2020-03-18 DIAGNOSIS — R112 Nausea with vomiting, unspecified: Secondary | ICD-10-CM

## 2020-03-18 DIAGNOSIS — R0602 Shortness of breath: Secondary | ICD-10-CM

## 2020-03-18 LAB — RENAL FUNCTION PANEL
Albumin: 2.7 g/dL — ABNORMAL LOW (ref 3.5–5.0)
Albumin: 2.4 g/dL — ABNORMAL LOW (ref 3.5–5.0)
Anion gap: 21 — ABNORMAL HIGH (ref 5–15)
Anion gap: 18 — ABNORMAL HIGH (ref 5–15)
BUN: 121 mg/dL — ABNORMAL HIGH (ref 8–23)
BUN: 121 mg/dL — ABNORMAL HIGH (ref 8–23)
CO2: 18 mmol/L — ABNORMAL LOW (ref 22–32)
CO2: 19 mmol/L — ABNORMAL LOW (ref 22–32)
Calcium: 8.6 mg/dL — ABNORMAL LOW (ref 8.9–10.3)
Calcium: 8.2 mg/dL — ABNORMAL LOW (ref 8.9–10.3)
Chloride: 74 mmol/L — ABNORMAL LOW (ref 98–111)
Chloride: 75 mmol/L — ABNORMAL LOW (ref 98–111)
Creatinine, Ser: 7.81 mg/dL — ABNORMAL HIGH (ref 0.44–1.00)
Creatinine, Ser: 7.87 mg/dL — ABNORMAL HIGH (ref 0.44–1.00)
GFR calc Af Amer: 5 mL/min — ABNORMAL LOW (ref >60)
GFR calc Af Amer: 5 mL/min — ABNORMAL LOW (ref >60)
GFR calc non Af Amer: 5 mL/min — ABNORMAL LOW (ref >60)
GFR calc non Af Amer: 5 mL/min — ABNORMAL LOW (ref >60)
Glucose, Bld: 87 mg/dL (ref 70–99)
Glucose, Bld: 125 mg/dL — ABNORMAL HIGH (ref 70–99)
Phosphorus: 10.6 mg/dL — ABNORMAL HIGH (ref 2.5–4.6)
Phosphorus: 10.7 mg/dL — ABNORMAL HIGH (ref 2.5–4.6)
Potassium: 4.4 mmol/L (ref 3.5–5.1)
Potassium: 3.3 mmol/L — ABNORMAL LOW (ref 3.5–5.1)
Sodium: 113 mmol/L — CL (ref 135–145)
Sodium: 112 mmol/L — CL (ref 135–145)

## 2020-03-18 LAB — URINALYSIS, ROUTINE W REFLEX MICROSCOPIC
Bilirubin Urine: NEGATIVE
Glucose, UA: NEGATIVE mg/dL
Ketones, ur: NEGATIVE mg/dL
Nitrite: NEGATIVE
Protein, ur: 100 mg/dL — AB
RBC / HPF: >50 RBC/hpf — ABNORMAL HIGH (ref 0–5)
Specific Gravity, Urine: 1.008 (ref 1.005–1.030)
pH: 6 (ref 5.0–8.0)

## 2020-03-18 LAB — IRON AND TIBC
Iron: 87 ug/dL (ref 28–170)
Saturation Ratios: 34 % — ABNORMAL HIGH (ref 10.4–31.8)
TIBC: 256 ug/dL (ref 250–450)
UIBC: 169 ug/dL

## 2020-03-18 LAB — BASIC METABOLIC PANEL
Anion gap: 17 — ABNORMAL HIGH (ref 5–15)
Anion gap: 18 — ABNORMAL HIGH (ref 5–15)
Anion gap: 19 — ABNORMAL HIGH (ref 5–15)
BUN: 117 mg/dL — ABNORMAL HIGH (ref 8–23)
BUN: 120 mg/dL — ABNORMAL HIGH (ref 8–23)
BUN: 123 mg/dL — ABNORMAL HIGH (ref 8–23)
CO2: 21 mmol/L — ABNORMAL LOW (ref 22–32)
CO2: 19 mmol/L — ABNORMAL LOW (ref 22–32)
CO2: 18 mmol/L — ABNORMAL LOW (ref 22–32)
Calcium: 8.4 mg/dL — ABNORMAL LOW (ref 8.9–10.3)
Calcium: 8.3 mg/dL — ABNORMAL LOW (ref 8.9–10.3)
Calcium: 8.4 mg/dL — ABNORMAL LOW (ref 8.9–10.3)
Chloride: 74 mmol/L — ABNORMAL LOW (ref 98–111)
Chloride: 75 mmol/L — ABNORMAL LOW (ref 98–111)
Chloride: 76 mmol/L — ABNORMAL LOW (ref 98–111)
Creatinine, Ser: 7.59 mg/dL — ABNORMAL HIGH (ref 0.44–1.00)
Creatinine, Ser: 7.61 mg/dL — ABNORMAL HIGH (ref 0.44–1.00)
Creatinine, Ser: 8.1 mg/dL — ABNORMAL HIGH (ref 0.44–1.00)
GFR calc Af Amer: 6 mL/min — ABNORMAL LOW (ref >60)
GFR calc Af Amer: 6 mL/min — ABNORMAL LOW (ref >60)
GFR calc Af Amer: 5 mL/min — ABNORMAL LOW (ref >60)
GFR calc non Af Amer: 5 mL/min — ABNORMAL LOW (ref >60)
GFR calc non Af Amer: 5 mL/min — ABNORMAL LOW (ref >60)
GFR calc non Af Amer: 4 mL/min — ABNORMAL LOW (ref >60)
Glucose, Bld: 89 mg/dL (ref 70–99)
Glucose, Bld: 88 mg/dL (ref 70–99)
Glucose, Bld: 111 mg/dL — ABNORMAL HIGH (ref 70–99)
Potassium: 3.3 mmol/L — ABNORMAL LOW (ref 3.5–5.1)
Potassium: 3.5 mmol/L (ref 3.5–5.1)
Potassium: 3.5 mmol/L (ref 3.5–5.1)
Sodium: 112 mmol/L — CL (ref 135–145)
Sodium: 112 mmol/L — CL (ref 135–145)
Sodium: 113 mmol/L — CL (ref 135–145)

## 2020-03-18 LAB — OSMOLALITY: Osmolality: 281 mOsm/kg (ref 275–295)

## 2020-03-18 LAB — OSMOLALITY, URINE: Osmolality, Ur: 254 mOsm/kg — ABNORMAL LOW (ref 300–900)

## 2020-03-18 LAB — CBC
HCT: 24.4 % — ABNORMAL LOW (ref 36.0–46.0)
HCT: 23.5 % — ABNORMAL LOW (ref 36.0–46.0)
Hemoglobin: 8.8 g/dL — ABNORMAL LOW (ref 12.0–15.0)
Hemoglobin: 8.4 g/dL — ABNORMAL LOW (ref 12.0–15.0)
MCH: 27.2 pg (ref 26.0–34.0)
MCH: 27 pg (ref 26.0–34.0)
MCHC: 36.1 g/dL — ABNORMAL HIGH (ref 30.0–36.0)
MCHC: 35.7 g/dL (ref 30.0–36.0)
MCV: 75.5 fL — ABNORMAL LOW (ref 80.0–100.0)
MCV: 75.6 fL — ABNORMAL LOW (ref 80.0–100.0)
Platelets: 358 10*3/uL (ref 150–400)
Platelets: 292 10*3/uL (ref 150–400)
RBC: 3.23 MIL/uL — ABNORMAL LOW (ref 3.87–5.11)
RBC: 3.11 MIL/uL — ABNORMAL LOW (ref 3.87–5.11)
RDW: 13.2 % (ref 11.5–15.5)
RDW: 13.1 % (ref 11.5–15.5)
WBC: 12.4 10*3/uL — ABNORMAL HIGH (ref 4.0–10.5)
WBC: 12 10*3/uL — ABNORMAL HIGH (ref 4.0–10.5)
nRBC: 0 % (ref 0.0–0.2)
nRBC: 0 % (ref 0.0–0.2)

## 2020-03-18 LAB — FERRITIN: Ferritin: 634 ng/mL — ABNORMAL HIGH (ref 11–307)

## 2020-03-18 LAB — PROCALCITONIN: Procalcitonin: 0.17 ng/mL

## 2020-03-18 LAB — HIV ANTIBODY (ROUTINE TESTING W REFLEX): HIV Screen 4th Generation wRfx: NONREACTIVE

## 2020-03-18 LAB — D-DIMER, QUANTITATIVE: D-Dimer, Quant: 3.07 ug/mL-FEU — ABNORMAL HIGH (ref 0.00–0.50)

## 2020-03-18 LAB — TSH: TSH: 1.093 u[IU]/mL (ref 0.350–4.500)

## 2020-03-18 LAB — RETICULOCYTES
Immature Retic Fract: 3.9 % (ref 2.3–15.9)
RBC.: 3.34 MIL/uL — ABNORMAL LOW (ref 3.87–5.11)
Retic Count, Absolute: 31.7 10*3/uL (ref 19.0–186.0)
Retic Ct Pct: 1 % (ref 0.4–3.1)

## 2020-03-18 LAB — VITAMIN B12: Vitamin B-12: 2209 pg/mL — ABNORMAL HIGH (ref 180–914)

## 2020-03-18 LAB — C-REACTIVE PROTEIN: CRP: 9 mg/dL — ABNORMAL HIGH (ref <1.0)

## 2020-03-18 LAB — SEDIMENTATION RATE: Sed Rate: 135 mm/hr — ABNORMAL HIGH (ref 0–22)

## 2020-03-18 LAB — SODIUM, URINE, RANDOM: Sodium, Ur: 35 mmol/L

## 2020-03-18 LAB — HEPATITIS B SURFACE ANTIGEN: Hepatitis B Surface Ag: NONREACTIVE

## 2020-03-18 LAB — MRSA PCR SCREENING: MRSA by PCR: NEGATIVE

## 2020-03-18 LAB — FOLATE: Folate: 60 ng/mL (ref >5.9)

## 2020-03-18 LAB — HEPATITIS C ANTIBODY: HCV Ab: NONREACTIVE

## 2020-03-18 LAB — BRAIN NATRIURETIC PEPTIDE: B Natriuretic Peptide: 530.8 pg/mL — ABNORMAL HIGH (ref 0.0–100.0)

## 2020-03-18 LAB — URIC ACID: Uric Acid, Serum: 8.9 mg/dL — ABNORMAL HIGH (ref 2.5–7.1)

## 2020-03-18 MED ORDER — SODIUM CHLORIDE 0.9 % IV SOLN: INTRAVENOUS | Status: DC

## 2020-03-18 MED ORDER — ACETAMINOPHEN 650 MG RE SUPP: 650.0000 mg | Freq: Four times a day (QID) | RECTAL | Status: DC | PRN

## 2020-03-18 MED ORDER — ALBUTEROL SULFATE (2.5 MG/3ML) 0.083% IN NEBU
2.5000 mg | INHALATION_SOLUTION | RESPIRATORY_TRACT | Status: DC | PRN
  Administered 2020-03-20 – 2020-04-05 (×3): 2.5 mg via RESPIRATORY_TRACT
  Filled 2020-03-18 (×3): qty 3

## 2020-03-18 MED ORDER — HYDRALAZINE HCL 25 MG PO TABS
25.0000 mg | ORAL_TABLET | Freq: Three times a day (TID) | ORAL | Status: DC
  Administered 2020-03-18 – 2020-03-19 (×5): 25 mg via ORAL
  Filled 2020-03-18 (×4): qty 1

## 2020-03-18 MED ORDER — PANTOPRAZOLE SODIUM 40 MG PO TBEC
40.0000 mg | DELAYED_RELEASE_TABLET | Freq: Every day | ORAL | Status: DC
  Administered 2020-03-18 – 2020-04-10 (×21): 40 mg via ORAL
  Filled 2020-03-18 (×21): qty 1

## 2020-03-18 MED ORDER — SENNOSIDES-DOCUSATE SODIUM 8.6-50 MG PO TABS: 1.0000 | ORAL_TABLET | Freq: Every evening | ORAL | Status: DC | PRN

## 2020-03-18 MED ORDER — POTASSIUM CHLORIDE CRYS ER 20 MEQ PO TBCR
20.0000 meq | EXTENDED_RELEASE_TABLET | Freq: Once | ORAL | Status: AC
  Administered 2020-03-18: 20 meq via ORAL
  Filled 2020-03-18: qty 1

## 2020-03-18 MED ORDER — SENNOSIDES-DOCUSATE SODIUM 8.6-50 MG PO TABS
1.0000 | ORAL_TABLET | Freq: Two times a day (BID) | ORAL | Status: AC
  Administered 2020-03-18 (×2): 1 via ORAL
  Filled 2020-03-18 (×2): qty 1

## 2020-03-18 MED ORDER — HEPARIN SODIUM (PORCINE) 5000 UNIT/ML IJ SOLN
5000.0000 [IU] | Freq: Three times a day (TID) | INTRAMUSCULAR | Status: DC
  Administered 2020-03-18 – 2020-03-20 (×7): 5000 [IU] via SUBCUTANEOUS
  Filled 2020-03-18 (×7): qty 1

## 2020-03-18 MED ORDER — DOCUSATE SODIUM 100 MG PO CAPS
200.0000 mg | ORAL_CAPSULE | Freq: Two times a day (BID) | ORAL | Status: DC
  Administered 2020-03-18 – 2020-04-05 (×25): 200 mg via ORAL
  Filled 2020-03-18 (×30): qty 2

## 2020-03-18 MED ORDER — POLYETHYLENE GLYCOL 3350 17 G PO PACK
17.0000 g | PACK | Freq: Two times a day (BID) | ORAL | Status: DC
  Administered 2020-03-18 – 2020-04-07 (×21): 17 g via ORAL
  Filled 2020-03-18 (×29): qty 1

## 2020-03-18 MED ORDER — METOPROLOL TARTRATE 50 MG PO TABS
50.0000 mg | ORAL_TABLET | Freq: Two times a day (BID) | ORAL | Status: DC
  Administered 2020-03-18 – 2020-03-21 (×7): 50 mg via ORAL
  Filled 2020-03-18 (×7): qty 1

## 2020-03-18 MED ORDER — ACETAMINOPHEN 325 MG PO TABS
650.0000 mg | ORAL_TABLET | Freq: Four times a day (QID) | ORAL | Status: DC | PRN
  Administered 2020-03-18 – 2020-03-19 (×2): 650 mg via ORAL
  Filled 2020-03-18 (×2): qty 2

## 2020-03-18 MED ORDER — ONDANSETRON HCL 4 MG/2ML IJ SOLN
4.0000 mg | Freq: Four times a day (QID) | INTRAMUSCULAR | Status: DC | PRN
  Administered 2020-03-18 – 2020-03-19 (×2): 4 mg via INTRAVENOUS
  Filled 2020-03-18 (×2): qty 2

## 2020-03-18 MED ORDER — CHLORHEXIDINE GLUCONATE CLOTH 2 % EX PADS
6.0000 | MEDICATED_PAD | Freq: Every day | CUTANEOUS | Status: DC
  Administered 2020-03-18 – 2020-03-20 (×3): 6 via TOPICAL

## 2020-03-18 MED ORDER — SODIUM CHLORIDE 0.9 % IV SOLN
1.0000 g | Freq: Once | INTRAVENOUS | Status: AC
  Administered 2020-03-18: 1 g via INTRAVENOUS
  Filled 2020-03-18: qty 10

## 2020-03-18 MED ORDER — SODIUM CHLORIDE 0.9 % IV SOLN
1.0000 g | INTRAVENOUS | Status: AC
  Administered 2020-03-19 – 2020-03-20 (×3): 1 g via INTRAVENOUS
  Filled 2020-03-18 (×3): qty 10

## 2020-03-18 MED ORDER — MAGNESIUM HYDROXIDE 400 MG/5ML PO SUSP
30.0000 mL | Freq: Two times a day (BID) | ORAL | Status: DC
  Filled 2020-03-18: qty 30

## 2020-03-18 MED ORDER — ONDANSETRON HCL 4 MG PO TABS: 4.0000 mg | ORAL_TABLET | Freq: Four times a day (QID) | ORAL | Status: DC | PRN

## 2020-03-18 MED ORDER — LEVOTHYROXINE SODIUM 88 MCG PO TABS
88.0000 ug | ORAL_TABLET | Freq: Every day | ORAL | Status: DC
  Administered 2020-03-18 – 2020-04-07 (×19): 88 ug via ORAL
  Filled 2020-03-18 (×19): qty 1

## 2020-03-18 NOTE — Plan of Care (Signed)
  Problem: Clinical Measurements: Goal: Diagnostic test results will improve Outcome: Progressing   

## 2020-03-18 NOTE — Consult Note (Signed)
[facility]  HISTORY AND PHYSICAL  Jasmine White is an 74 y.o. female.    Chief Complaint:  N/v, renal failure, and hyponatremia  HPI: Pt is a 62F with a PMH sig for HTN, HLD, hypothyroidism, COVID in November who is now seen in consultation at the request of Dr. Candiss Norse for eval and recs re: AKI, hyponatremia.  Pt has had COVID in November and since then hasn't felt right.  Having burning every night in her legs for which she was taking Advil nightly.  Takes hyzaar for HTN.  Previously Cr was 0.78 in Dec 2020.  Went to PCP 03/01/20 and was 2.34 then.  Has had one week of n/v.  Has still been taking her Advil and her hyzaar.  Went to [facility] last night where she was found to have Cr of > 7, BUN 119, and severe hyponatremia at 109. CT scan showed large amount of inspissated stool and possibly thickened sigmoid colon and stomach/ GB.  Was given 1L NS, started on 50 mL/ hr NS, and transferred to [facility] for [facility].    Pt states she's very thirsty right now.  No dizziness/ falls/ seizure like activity.  She has been hypertensive here.  Urine lytes show isosthenuria with urine Na 24.  (? If before or after IV NS bolus).  Na 112 this AM, no real movement in BUN/ Cr.  No HA, f/c, SOB, rashes, nosebleeds, red/ itchy eyes, nasal crusting, coughing up blood, blood in urine/ stool.  Says some foamy urine.     PMH: Past Medical History:  Diagnosis Date  . Arthritis   . Bruises easily   . Cancer ([place])    basal skin cancer  . Chronic back pain    HNP/stenosis and radiculopathy  . Hyperlipidemia    takes Pravastatin daily  . Hypertension    takes Hyzaar and toprol daily  . Hypothyroidism    takes Synthroid daily  . Insomnia    takes Elevil nightly  . Joint pain   . Joint swelling   . Low BP    past some sedation  . Nocturia   . PONV (postoperative nausea and vomiting)    with knee replacement b/p dropped  . Sinusitis    finished zpak yesterday  . Urinary frequency     PSH: Past Surgical History:  Procedure Laterality Date  . ABDOMINAL HYSTERECTOMY    . BACK SURGERY    . basal cell skin cancer     on face and forehead  . bil knee replacements    . BREAST BIOPSY     left breast/benign  . COLONOSCOPY    . LUMBAR LAMINECTOMY/DECOMPRESSION MICRODISCECTOMY  08/13/2012   Procedure: LUMBAR LAMINECTOMY/DECOMPRESSION MICRODISCECTOMY 1 LEVEL;  Surgeon: Erline Levine, MD;  Location: [facility];  Service: Neurosurgery;  Laterality: Left;  Left lumbar one-two Microdiskectomy    Past Medical History:  Diagnosis Date  . Arthritis   . Bruises easily   . Cancer ([place])    basal skin cancer  . Chronic back pain    HNP/stenosis and radiculopathy  . Hyperlipidemia    takes Pravastatin daily  . Hypertension    takes Hyzaar and toprol daily  . Hypothyroidism    takes Synthroid daily  . Insomnia    takes Elevil nightly  . Joint pain   . Joint swelling   . Low BP    past some sedation  . Nocturia   . PONV (postoperative nausea and vomiting)  with knee replacement b/p dropped  . Sinusitis    finished zpak yesterday  . Urinary frequency     Medications:   Scheduled: . Chlorhexidine Gluconate Cloth  6 each Topical Daily  . docusate sodium  200 mg Oral BID  . heparin  5,000 Units Subcutaneous Q8H  . hydrALAZINE  25 mg Oral TID  . levothyroxine  88 mcg Oral Q0600  . metoprolol tartrate  50 mg Oral BID  . pantoprazole  40 mg Oral Daily  . polyethylene glycol  17 g Oral BID  . senna-docusate  1 tablet Oral BID    Medications Prior to Admission  Medication Sig Dispense Refill  . amitriptyline (ELAVIL) 50 MG tablet Take 1 tablet (50 mg total) by mouth at bedtime. 90 tablet 1  . Coenzyme Q10 (CO Q-10) 200 MG CAPS Take 1 capsule by mouth daily.    . Garlic 7425 MG CAPS Take 1 capsule by mouth daily. Take 1000 mg daily    . HYDROcodone-acetaminophen (NORCO/VICODIN) 5-325 MG tablet Take 1 tablet by mouth every 6 (six) hours as needed for moderate  pain. 40 tablet 0  . losartan-hydrochlorothiazide (HYZAAR) 100-12.5 MG tablet Take 1 tablet by mouth daily. 90 tablet 1  . Lutein 20 MG CAPS Take 1 capsule by mouth daily.    . metoprolol succinate (TOPROL-XL) 100 MG 24 hr tablet Take 1 tablet (100 mg total) by mouth daily. Take with or immediately following a meal. 90 tablet 1  . Omega-3 Fatty Acids (FISH OIL) 1200 MG CAPS Take by mouth.    . Saline (SIMPLY SALINE) 0.9 % AERS Place 1 spray into the nose daily as needed. For sinuses    . SYNTHROID 88 MCG tablet TAKE 1 TABLET BY MOUTH ONCE DAILY BEFORE BREAKFAST 90 tablet 1    ALLERGIES:   Allergies  Allergen Reactions  . Percocet [Oxycodone-Acetaminophen] Nausea Only  . Ultram [Tramadol]     Makes you crazy and nauseated    FAM HX: Family History  Problem Relation Age of Onset  . Hypertension Mother   . Heart disease Mother   . Stroke Father   . Heart disease Father   . Alcohol abuse Father   . Arthritis Brother   . Cancer Brother 93       prostate  . Arthritis Brother   . Colon cancer Neg Hx   . Colon polyps Neg Hx   . Stomach cancer Neg Hx   . Rectal cancer Neg Hx     Social History:   reports that she has never smoked. She has never used smokeless tobacco. She reports that she does not drink alcohol and does not use drugs.  ROS: ROS: all other systems reviewed and are negative except as per HPI  Blood pressure (!) 162/70, pulse 67, temperature 98 F (36.7 C), temperature source Oral, resp. rate 18, height 5\' 6"  (1.676 m), weight 63.5 kg, SpO2 99 %. PHYSICAL EXAM: Physical Exam  GEN NAD, sitting in bed, AAO x 3 HEENT EOMI PERRL NECK flat neck veins PULM clear bilaterally CV RRR ABD soft, nontender EXT no LE edema NEURO AAO x 3 no asterixis SKIN poor turgor   Results for orders placed or performed during the hospital encounter of 03/17/20 (from the past 48 hour(s))  Lipase, blood     Status: Abnormal   Collection Time: 03/17/20  6:21 PM  Result Value Ref  Range   Lipase 94 (H) 11 - 51 U/L    Comment: Performed at  [facility], [address] [place], [place] [address]  Comprehensive metabolic panel     Status: Abnormal   Collection Time: 03/17/20  6:21 PM  Result Value Ref Range   Sodium 109 (LL) 135 - 145 mmol/L    Comment: CRITICAL RESULT CALLED TO, READ BACK BY AND VERIFIED WITH: Danford Bad RN @1941  03/17/2020 OLSONM    Potassium 3.5 3.5 - 5.1 mmol/L   Chloride 68 (L) 98 - 111 mmol/L   CO2 21 (L) 22 - 32 mmol/L   Glucose, Bld 102 (H) 70 - 99 mg/dL    Comment: Glucose reference range applies only to samples taken after fasting for at least 8 hours.   BUN 118 (H) 8 - 23 mg/dL    Comment: RESULTS CONFIRMED BY MANUAL DILUTION   Creatinine, Ser 7.12 (H) 0.44 - 1.00 mg/dL   Calcium 8.7 (L) 8.9 - 10.3 mg/dL   Total Protein 7.8 6.5 - 8.1 g/dL   Albumin 3.2 (L) 3.5 - 5.0 g/dL   AST 19 15 - 41 U/L   ALT 13 0 - 44 U/L   Alkaline Phosphatase 71 38 - 126 U/L   Total Bilirubin 0.7 0.3 - 1.2 mg/dL   GFR calc non Af Amer 5 (L) >60 mL/min   GFR calc Af Amer 6 (L) >60 mL/min   Anion gap 20 (H) 5 - 15    Comment: Performed at [facility], [address] [place], [place] [address]  CBC     Status: Abnormal   Collection Time: 03/17/20  6:21 PM  Result Value Ref Range   WBC 12.5 (H) 4.0 - 10.5 K/uL   RBC 3.54 (L) 3.87 - 5.11 MIL/uL   Hemoglobin 9.5 (L) 12.0 - 15.0 g/dL   HCT 26.8 (L) 36 - 46 %   MCV 75.7 (L) 80.0 - 100.0 fL   MCH 26.8 26.0 - 34.0 pg   MCHC 35.4 30.0 - 36.0 g/dL   RDW 13.1 11.5 - 15.5 %   Platelets 429 (H) 150 - 400 K/uL   nRBC 0.0 0.0 - 0.2 %    Comment: Performed at [facility], [address] [place], [place] [address]  SARS Coronavirus 2 by RT PCR (hospital order, performed in [facility] hospital lab) Nasopharyngeal Nasopharyngeal Swab     Status: None   Collection Time: 03/17/20  7:18 PM   Specimen: Nasopharyngeal Swab  Result Value Ref Range   SARS Coronavirus 2 NEGATIVE  NEGATIVE    Comment: (NOTE) SARS-CoV-2 target nucleic acids are NOT DETECTED.  The SARS-CoV-2 RNA is generally detectable in upper and lower respiratory specimens during the acute phase of infection. The lowest concentration of SARS-CoV-2 viral copies this assay can detect is 250 copies / mL. A negative result does not preclude SARS-CoV-2 infection and should not be used as the sole basis for treatment or other patient management decisions.  A negative result may occur with improper specimen collection / handling, submission of specimen other than nasopharyngeal swab, presence of viral mutation(s) within the areas targeted by this assay, and inadequate number of viral copies (<250 copies / mL). A negative result must be combined with clinical observations, patient history, and epidemiological information.  Fact Sheet for Patients:   StrictlyIdeas.no  Fact Sheet for Healthcare Providers: BankingDealers.co.za  This test is not yet approved or  cleared by the [place] FDA and has been authorized for detection and/or diagnosis of SARS-CoV-2 by FDA under  an Emergency Use Authorization (EUA).  This EUA will remain in effect (meaning this test can be used) for the duration of the COVID-19 declaration under Section 564(b)(1) of the Act, 21 U.S.C. section 360bbb-3(b)(1), unless the authorization is terminated or revoked sooner.  Performed at [facility], [address] [place], [place] [address]   Urinalysis, Routine w reflex microscopic     Status: Abnormal   Collection Time: 03/17/20  8:10 PM  Result Value Ref Range   Color, Urine YELLOW YELLOW   APPearance CLOUDY (A) CLEAR   Specific Gravity, Urine 1.015 1.005 - 1.030   pH 5.5 5.0 - 8.0   Glucose, UA NEGATIVE NEGATIVE mg/dL   Hgb urine dipstick LARGE (A) NEGATIVE   Bilirubin Urine NEGATIVE NEGATIVE   Ketones, ur NEGATIVE NEGATIVE mg/dL   Protein, ur 100 (A) NEGATIVE  mg/dL   Nitrite NEGATIVE NEGATIVE   Leukocytes,Ua LARGE (A) NEGATIVE    Comment: Performed at [facility], [address] [place], [place] [address]  Sodium, urine, random     Status: None   Collection Time: 03/17/20  8:10 PM  Result Value Ref Range   Sodium, Ur 24 mmol/L    Comment: Performed at [facility] [address], [place], [place] [address]  Creatinine, urine, random     Status: None   Collection Time: 03/17/20  8:10 PM  Result Value Ref Range   Creatinine, Urine 44.13 mg/dL    Comment: Performed at [facility] [address] [place], [place] [address]  Osmolality, urine     Status: Abnormal   Collection Time: 03/17/20  8:10 PM  Result Value Ref Range   Osmolality, Ur 273 (L) 300 - 900 mOsm/kg    Comment: PERFORMED AT [facility] Performed at [facility], [place] [address] [place], [place] [address]   Urinalysis, Microscopic (reflex)     Status: Abnormal   Collection Time: 03/17/20  8:10 PM  Result Value Ref Range   RBC / HPF 21-50 0 - 5 RBC/hpf   WBC, UA 21-50 0 - 5 WBC/hpf   Bacteria, UA MANY (A) NONE SEEN   Squamous Epithelial / LPF 0-5 0 - 5    Comment: Performed at [facility], [address] [place], [place] [address]  Osmolality     Status: None   Collection Time: 03/17/20  8:43 PM  Result Value Ref Range   Osmolality 275 275 - 295 mOsm/kg    Comment: PERFORMED AT [facility] Performed at [facility], [place] [address], [place], [place] [address]   TSH     Status: None   Collection Time: 03/17/20  8:43 PM  Result Value Ref Range   TSH 1.209 0.350 - 4.500 uIU/mL    Comment: Performed by a 3rd Generation assay with a functional sensitivity of <=0.01 uIU/mL. Performed at [facility], [place] [address], [place],  [address]   Basic metabolic panel     Status: Abnormal   Collection Time: 03/17/20  8:43 PM  Result Value Ref Range   Sodium 112 (LL) 135 - 145 mmol/L    Comment:  CRITICAL RESULT CALLED TO, READ BACK BY AND VERIFIED WITH: Nadara Eaton, RN @ 2153 03/17/20 BY GWYN,P    Potassium 3.4 (L) 3.5 - 5.1 mmol/L   Chloride 73 (L) 98 - 111 mmol/L   CO2 20 (L) 22 - 32 mmol/L  Glucose, Bld 95 70 - 99 mg/dL    Comment: Glucose reference range applies only to samples taken after fasting for at least 8 hours.   BUN 116 (H) 8 - 23 mg/dL    Comment: RESULTS CONFIRMED BY MANUAL DILUTION   Creatinine, Ser 7.25 (H) 0.44 - 1.00 mg/dL   Calcium 8.1 (L) 8.9 - 10.3 mg/dL   GFR calc non Af Amer 5 (L) >60 mL/min   GFR calc Af Amer 6 (L) >60 mL/min   Anion gap 19 (H) 5 - 15    Comment: Performed at [facility], [address] [place], [place] [address]  MRSA PCR Screening     Status: None   Collection Time: 03/17/20 11:47 PM   Specimen: Nasal Mucosa; Nasopharyngeal  Result Value Ref Range   MRSA by PCR NEGATIVE NEGATIVE    Comment:        The GeneXpert MRSA Assay (FDA approved for NASAL specimens only), is one component of a comprehensive MRSA colonization surveillance program. It is not intended to diagnose MRSA infection nor to guide or monitor treatment for MRSA infections. Performed at [facility], [place] [address] [place], [place] [address]   CBC     Status: Abnormal   Collection Time: 03/18/20  1:16 AM  Result Value Ref Range   WBC 12.4 (H) 4.0 - 10.5 K/uL   RBC 3.23 (L) 3.87 - 5.11 MIL/uL   Hemoglobin 8.8 (L) 12.0 - 15.0 g/dL    Comment: Reticulocyte Hemoglobin testing may be clinically indicated, consider ordering this additional test [id number]    HCT 24.4 (L) 36 - 46 %   MCV 75.5 (L) 80.0 - 100.0 fL   MCH 27.2 26.0 - 34.0 pg   MCHC 36.1 (H) 30.0 - 36.0 g/dL   RDW 13.2 11.5 - 15.5 %   Platelets 358 150 - 400 K/uL   nRBC 0.0 0.0 - 0.2 %    Comment: Performed at [facility], [place] [address], [place], [place] [address]  Basic metabolic panel     Status: Abnormal   Collection Time: 03/18/20  1:16 AM  Result Value Ref Range    Sodium 112 (LL) 135 - 145 mmol/L    Comment: CRITICAL RESULT CALLED TO, READ BACK BY AND VERIFIED WITH: Lerry Paterson RN [phone] M GARRETT    Potassium 3.3 (L) 3.5 - 5.1 mmol/L   Chloride 74 (L) 98 - 111 mmol/L   CO2 21 (L) 22 - 32 mmol/L   Glucose, Bld 89 70 - 99 mg/dL    Comment: Glucose reference range applies only to samples taken after fasting for at least 8 hours.   BUN 117 (H) 8 - 23 mg/dL   Creatinine, Ser 7.59 (H) 0.44 - 1.00 mg/dL   Calcium 8.4 (L) 8.9 - 10.3 mg/dL   GFR calc non Af Amer 5 (L) >60 mL/min   GFR calc Af Amer 6 (L) >60 mL/min   Anion gap 17 (H) 5 - 15    Comment: Performed at [facility] [address] [place], [place] [address]  TSH     Status: None   Collection Time: 03/18/20  1:16 AM  Result Value Ref Range   TSH 1.093 0.350 - 4.500 uIU/mL    Comment: Performed by a 3rd Generation assay with a functional sensitivity of <=0.01 uIU/mL. Performed at [facility], [place] [address], [place], [place] [address]   Basic metabolic panel  Status: Abnormal   Collection Time: 03/18/20  6:05 AM  Result Value Ref Range   Sodium 112 (LL) 135 - 145 mmol/L    Comment: CRITICAL RESULT CALLED TO, READ BACK BY AND VERIFIED WITH: Vear Clock RN [phone] M GARRETT    Potassium 3.5 3.5 - 5.1 mmol/L   Chloride 75 (L) 98 - 111 mmol/L   CO2 19 (L) 22 - 32 mmol/L   Glucose, Bld 88 70 - 99 mg/dL    Comment: Glucose reference range applies only to samples taken after fasting for at least 8 hours.   BUN 120 (H) 8 - 23 mg/dL   Creatinine, Ser 7.61 (H) 0.44 - 1.00 mg/dL   Calcium 8.3 (L) 8.9 - 10.3 mg/dL   GFR calc non Af Amer 5 (L) >60 mL/min   GFR calc Af Amer 6 (L) >60 mL/min   Anion gap 18 (H) 5 - 15    Comment: Performed at [facility] [address], [place], [place] [address]  CBC     Status: Abnormal   Collection Time: 03/18/20  6:05 AM  Result Value Ref Range   WBC 12.0 (H) 4.0 - 10.5 K/uL   RBC 3.11 (L) 3.87 - 5.11 MIL/uL    Hemoglobin 8.4 (L) 12.0 - 15.0 g/dL    Comment: Reticulocyte Hemoglobin testing may be clinically indicated, consider ordering this additional test [id number]    HCT 23.5 (L) 36 - 46 %   MCV 75.6 (L) 80.0 - 100.0 fL   MCH 27.0 26.0 - 34.0 pg   MCHC 35.7 30.0 - 36.0 g/dL   RDW 13.1 11.5 - 15.5 %   Platelets 292 150 - 400 K/uL   nRBC 0.0 0.0 - 0.2 %    Comment: Performed at [facility], [place] [address] [place], [place] [address]  Uric acid     Status: Abnormal   Collection Time: 03/18/20  7:45 AM  Result Value Ref Range   Uric Acid, Serum 8.9 (H) 2.5 - 7.1 mg/dL    Comment: Performed at [facility] [address], [place], [place] [address]  Procalcitonin - Baseline     Status: None   Collection Time: 03/18/20  7:45 AM  Result Value Ref Range   Procalcitonin 0.17 ng/mL    Comment:        Interpretation: PCT (Procalcitonin) <= 0.5 ng/mL: Systemic infection (sepsis) is not likely. Local bacterial infection is possible. (NOTE)       Sepsis PCT Algorithm           Lower Respiratory Tract                                      Infection PCT Algorithm    ----------------------------     ----------------------------         PCT < 0.25 ng/mL                PCT < 0.10 ng/mL          Strongly encourage             Strongly discourage   discontinuation of antibiotics    initiation of antibiotics    ----------------------------     -----------------------------       PCT 0.25 - 0.50 ng/mL            PCT 0.10 - 0.25 ng/mL  OR       >80% decrease in PCT            Discourage initiation of                                            antibiotics      Encourage discontinuation           of antibiotics    ----------------------------     -----------------------------         PCT >= 0.50 ng/mL              PCT 0.26 - 0.50 ng/mL               AND        <80% decrease in PCT             Encourage initiation of                                             antibiotics        Encourage continuation           of antibiotics    ----------------------------     -----------------------------        PCT >= 0.50 ng/mL                  PCT > 0.50 ng/mL               AND         increase in PCT                  Strongly encourage                                      initiation of antibiotics    Strongly encourage escalation           of antibiotics                                     -----------------------------                                           PCT <= 0.25 ng/mL                                                 OR                                        > 80% decrease in PCT                                      Discontinue / Do not initiate  antibiotics  Performed at [facility], [place] [address] [place], [place] [address]   C-reactive protein     Status: Abnormal   Collection Time: 03/18/20  7:45 AM  Result Value Ref Range   CRP 9.0 (H) <1.0 mg/dL    Comment: Performed at [facility] [address], [place], [place] [address]  Brain natriuretic peptide     Status: Abnormal   Collection Time: 03/18/20  7:48 AM  Result Value Ref Range   B Natriuretic Peptide 530.8 (H) 0.0 - 100.0 pg/mL    Comment: Performed at [facility] [address] [place], [place] [address]    CT Abdomen Pelvis Wo Contrast  Result Date: 03/17/2020 CLINICAL DATA:  Vomiting for 1 week, low sella diam, elevated BUN and creatinine EXAM: CT ABDOMEN AND PELVIS WITHOUT CONTRAST TECHNIQUE: Multidetector CT imaging of the abdomen and pelvis was performed following the standard protocol without IV contrast. COMPARISON:  Chest radiograph 12/26/2014, lumbar radiograph 08/13/2012, MR lumbar spine 06/16/2012 FINDINGS: Lower chest: Scarring and reticular changes noted in the lung bases, left greater than right. No consolidative opacity or pleural effusion. Normal heart size. No pericardial effusion. Tiny benign appearing  macrocalcification versus fiducial in the right breast. Hepatobiliary: No visible focal liver lesions. Smooth liver surface contour. Normal hepatic attenuation. Gallbladder is moderately distended. Some focal thickening is noted towards the gallbladder fundus, possible adenomyomatosis. A partially calcified gallstone noted layering dependently as well. No pericholecystic fluid or inflammation. Normal caliber biliary tree without visible calcified intraductal gallstones. Pancreas: Moderate pancreatic atrophy. No inflammation or ductal dilatation. No discernible pancreatic lesions. Spleen: Normal in size without focal abnormality. Adrenals/Urinary Tract: No concerning adrenal lesions. No visible or contour deforming renal lesions. No urolithiasis or hydronephrosis. Urinary bladder is moderately distended with layering air-fluid level. No significant pericholecystic inflammation or urinary bladder wall thickening. Stomach/Bowel: Question some mild thickening versus underdistention of the stomach. Duodenum takes a normal course across the midline abdomen. No small bowel thickening or dilatation. Large volume of inspissated fecal material noted throughout the colon. Question some mild distal colonic thickening versus underdistention of the sigmoid. No adjacent inflammation, free fluid or evidence of perforation. No high-grade bowel obstruction. Vascular/Lymphatic: Atherosclerotic calcifications throughout the abdominal aorta and branch vessels. No aneurysm or ectasia. No enlarged abdominopelvic lymph nodes. Reproductive: Uterus is surgically absent. No concerning adnexal lesions. Other: No abdominopelvic free fluid or free gas. No bowel containing hernias. Small fat containing umbilical hernia. Musculoskeletal: Prior L3-L5 posterior spinal fusion. No evidence of hardware failure or acute complication is seen. Mild anterior wedging and Schmorl's node at the L1 superior endplate is similar to comparison radiographs.  Diffuse discogenic and facet degenerative changes noted throughout the lumbar spine at both instrumented in non instrumented levels. Additional degenerative changes in the hips and pelvis. The osseous structures appear diffusely demineralized which may limit detection of small or nondisplaced fractures. No acute or worrisome osseous lesions. IMPRESSION: 1. Large volume of inspissated fecal material noted throughout the colon. Mild distal colonic thickening versus underdistention of the sigmoid without evidence of inflammation. Correlate for features of constipation. Furthermore, could consider correlation with most recent colonoscopy or outpatient visualization if not recently performed. 2. Similar thickening versus underdistention of the stomach, could reflect gastritis or secondary change given patient's prolonged emesis. 3. Moderately distended urinary bladder with layering air-fluid level. Could reflect recent instrumentation/catheterization, however if urinary symptoms are present, recommend further evaluation with urinalysis to exclude cystitis. 4. Cholelithiasis without evidence of  acute cholecystitis. Mild focal thickening at the gallbladder fundus is suspected to reflect adenomyomatosis but could correlate with outpatient right upper quadrant ultrasound. Could perform on acute basis if biliary symptoms are present. 5. Aortic Atherosclerosis (ICD10-I70.0). Electronically Signed   By: Lovena Le M.D.   On: 03/17/2020 19:37   X-ray chest PA and lateral  Result Date: 03/18/2020 CLINICAL DATA:  Acute renal insufficiency EXAM: CHEST - 2 VIEW COMPARISON:  December 26, 2014 FINDINGS: No pneumothorax. The cardiomediastinal silhouette is normal. No pulmonary nodules or masses. Mild bibasilar opacities, likely atelectasis. No overt edema. IMPRESSION: Probable mild bibasilar atelectasis. Recommend follow-up to resolution. No other acute abnormalities. Electronically Signed   By: Dorise Bullion III M.D   On:  03/18/2020 08:56    Assessment/Plan 1.  AKI: baseline Cr 0.78.  Had Cr of 2.3 2 weeks ago and now is > 7.0 in the setting of 1 week of n/v, Advil, and Hyzaar.  Suspect at least some ATN component.  UA + blood and protein with WBCs too, many bactereria.  No emergent indications for dialysis but need to follow closely, fortunately not uremic.  Not sure why she had Cr bump from 0.78--> 2.34 pre- n/v unless wasn't eating/drinking due to constipation that was already developing.  Full serologic workup in progress.  Discussed with pt the possibility of needing a biopsy and/ or dialysis and she is willing to pursue both if determined to be necessary.  2.  Severe hypovolemic hyponatremia: in setting of n/v and HCTZ use.  Need to correct judiciously to avoid adverse outcome.  No altered sensorium (surprisingly) so will continue with NS for now.  Increased NS to 75 mL/ hr and serial Na and neuro checks.  Urine lytes showed isosthenuria and Na of 24 which can be seen in ATN as well.  N/v can cause SIADH so will need to watch closely.    3.  Constipation, severe: please avoid all Mg containing laxatives/ enemas and fleets enemas.  Rest per primary  4.  HTN: Not hypotensive here as one would expect with that degree of vol depletion, holding hyzaar, takes toprol at home too which is on board  5.  Possible UTI- culture in progress, on CTX  6.  Abnormal CT scan- RUQ [place] ordered.  7.  Dispo: admitted  [place], Benjamine Mola 03/18/2020, 9:59 AM

## 2020-03-18 NOTE — Progress Notes (Signed)
Patient back on floor.

## 2020-03-18 NOTE — Progress Notes (Addendum)
Patient off the floor, went to ultra sound

## 2020-03-18 NOTE — Progress Notes (Signed)
PROGRESS NOTE                                                                                                                                                                                                             Patient Demographics:    Jasmine White, is a 74 y.o. female, DOB - Dec 21, 1945, LYY:[id number]  Admit date - 03/17/2020   Admitting Physician Clance Boll, MD  Outpatient Primary MD for the patient is Chevis Pretty, [place]  LOS - 1  Chief Complaint  Patient presents with  . Emesis       Brief Narrative -  Jasmine White is a 74 y.o. female with medical history significant of  Chronic back pain HTN, hypothyroidism, recently diagnosed CKD, HLD, Hx of COVID infection and recovery with persistent fatigue who presents with vomiting. Per patient states she has had intermittent n/v that started 9 days ago, in the ER she had changes suggestive of severe constipation and stool burden on CT scan, her blood numbers were suggestive of UTI, severe dehydration, hypernatremia and AKI.  She was admitted for further treatment.   Subjective:    Jasmine White today has, No headache, No chest pain, No abdominal pain - No Nausea, No new weakness tingling or numbness, No Cough - SOB.  Feels weak all over and is severely thirsty.   Assessment  & Plan :     1.  AKI with severe dehydration and hypernatremia.  Likely brought on by HCTZ, UTI and superimposed nausea vomiting, function about 1 year ago appears stable, CT scan does not show any acute obstruction.  Hydrate and monitor.  Nephrology on board.  FeNa > 1 but this could be due to HCTZ.  She has not made any urine so far and I think she is extremely dry, will defer further management to nephrology.  2.  TIA.  On Rocephin.  3.  Hypothyroidism.  Continue home dose Synthroid TSH is stable.  4.  Essential hypertension.  On beta-blocker, hydralazine added.  Will monitor and adjust.  5.  Chronic microcytic anemia.  Check  anemia panel.  Was recently placed on iron supplementation by PCP, if iron deficient will defer to PCP on iron deficiency anemia work-up.  For now placed on PPI.  6.  Severe stool burden and constipation.  Placed on aggressive bowel regimen.  Abdominal exam benign.  7.  Weakness and deconditioning.  PT OT.    Condition - Extremely Guarded  Family Communication  : Daughter  [name] [phone] on 03/18/2020  Code Status :  Full  Consults  :  None  Procedures  :   CT - 1. Large volume of inspissated fecal material noted throughout the colon. Mild distal colonic thickening versus underdistention of the sigmoid without evidence of inflammation. Correlate for features of constipation. Furthermore, could consider correlation with most recent colonoscopy or outpatient visualization if not recently performed. 2. Similar thickening versus underdistention of the stomach, could reflect gastritis or secondary change given patient's prolonged emesis. 3. Moderately distended urinary bladder with layering air-fluid level. Could reflect recent instrumentation/catheterization, however if urinary symptoms are present, recommend further evaluation with urinalysis to exclude cystitis. 4. Cholelithiasis without evidence of acute cholecystitis. Mild focal thickening at the gallbladder fundus is suspected to reflect adenomyomatosis but could correlate with outpatient right upper quadrant ultrasound. Could perform on acute basis if biliary symptoms are present. 5. Aortic Atherosclerosis (ICD10-I70.0).  PUD Prophylaxis : PPI  Disposition Plan  :    Status is: Inpatient  Remains inpatient appropriate because:IV treatments appropriate due to intensity of illness or inability to take PO   Dispo: The patient is from: Home              Anticipated d/c is to: SNF              Anticipated d/c date is: 3 days              Patient currently is not medically stable to d/c.   DVT Prophylaxis  :   Heparin    Lab  Results  Component Value Date   PLT 292 03/18/2020    Diet :  Diet Order            DIET SOFT Room service appropriate? Yes; Fluid consistency: Thin  Diet effective now                  Inpatient Medications Scheduled Meds: . docusate sodium  200 mg Oral BID  . heparin  5,000 Units Subcutaneous Q8H  . hydrALAZINE  25 mg Oral TID  . levothyroxine  88 mcg Oral Q0600  . metoprolol tartrate  50 mg Oral BID  . polyethylene glycol  17 g Oral BID  . senna-docusate  1 tablet Oral BID   Continuous Infusions: . sodium chloride 75 mL/hr at 03/18/20 0856  . [START ON 03/19/2020] cefTRIAXone (ROCEPHIN)  IV     PRN Meds:.acetaminophen **OR** [DISCONTINUED] acetaminophen, albuterol, [DISCONTINUED] ondansetron **OR** ondansetron (ZOFRAN) IV  Antibiotics  :   Anti-infectives (From admission, onward)   Start     Dose/Rate Route Frequency Ordered Stop   03/19/20 0000  cefTRIAXone (ROCEPHIN) 1 g in sodium chloride 0.9 % 100 mL IVPB     Discontinue    Note to Pharmacy: 1st dose now   1 g 200 mL/hr over 30 Minutes Intravenous Every 24 hours 03/18/20 0127     03/18/20 0145  cefTRIAXone (ROCEPHIN) 1 g in sodium chloride 0.9 % 100 mL IVPB       Note to Pharmacy: 1st dose now   1 g 200 mL/hr over 30 Minutes Intravenous  Once 03/18/20 0135 03/18/20 0252          Objective:   Vitals:   03/17/20 2200 03/17/20 2321 03/18/20 0445 03/18/20 0754  BP: (!) 156/67 (!) 155/85 (!) 162/72 (!) 162/70  Pulse: 66 68 67 67  Resp:  18 15 18   Temp:  98.4 F (36.9 C) 98.6 F (37 C) 98 F (36.7  C)  TempSrc:  Oral Axillary Oral  SpO2: 100% 98% 98% 99%  Weight:  63.5 kg    Height:  5\' 6"  (1.676 m)      SpO2: 99 %  Wt Readings from Last 3 Encounters:  03/17/20 63.5 kg  03/01/20 61.7 kg  09/02/19 60.3 kg     Intake/Output Summary (Last 24 hours) at 03/18/2020 5409 Last data filed at 03/18/2020 0322 Gross per 24 hour  Intake 1370.77 ml  Output 350 ml  Net 1020.77 ml     Physical  Exam  Awake Alert, No new F.N deficits, appears quite dehydrated .AT,PERRAL Supple Neck,No JVD, No cervical lymphadenopathy appriciated.  Symmetrical Chest wall movement, Good air movement bilaterally, CTAB RRR,No Gallops,Rubs or new Murmurs, No Parasternal Heave +ve B.Sounds, Abd Soft, No tenderness, No organomegaly appriciated, No rebound - guarding or rigidity. No Cyanosis, Clubbing or edema, No new Rash or bruise    Data Review:    Recent Labs  Lab 03/17/20 1821 03/18/20 0116 03/18/20 0605  WBC 12.5* 12.4* 12.0*  HGB 9.5* 8.8* 8.4*  HCT 26.8* 24.4* 23.5*  PLT 429* 358 292  MCV 75.7* 75.5* 75.6*  MCH 26.8 27.2 27.0  MCHC 35.4 36.1* 35.7  RDW 13.1 13.2 13.1    Recent Labs  Lab 03/17/20 1821 03/17/20 2043 03/18/20 0116 03/18/20 0605 03/18/20 0745 03/18/20 0748  NA 109* 112* 112* 112*  --   --   K 3.5 3.4* 3.3* 3.5  --   --   CL 68* 73* 74* 75*  --   --   CO2 21* 20* 21* 19*  --   --   GLUCOSE 102* 95 89 88  --   --   BUN 118* 116* 117* 120*  --   --   CREATININE 7.12* 7.25* 7.59* 7.61*  --   --   CALCIUM 8.7* 8.1* 8.4* 8.3*  --   --   AST 19  --   --   --   --   --   ALT 13  --   --   --   --   --   ALKPHOS 71  --   --   --   --   --   BILITOT 0.7  --   --   --   --   --   ALBUMIN 3.2*  --   --   --   --   --   CRP  --   --   --   --  9.0*  --   TSH  --  1.209 1.093  --   --   --   BNP  --   --   --   --   --  530.8*    Recent Labs  Lab 03/17/20 1918 03/18/20 0745 03/18/20 0748  CRP  --  9.0*  --   BNP  --   --  530.8*  SARSCOV2NAA NEGATIVE  --   --     ------------------------------------------------------------------------------------------------------------------ No results for input(s): CHOL, HDL, LDLCALC, TRIG, CHOLHDL, LDLDIRECT in the last 72 hours.  No results found for: HGBA1C ------------------------------------------------------------------------------------------------------------------ Recent Labs    03/18/20 0116  TSH 1.093    ------------------------------------------------------------------------------------------------------------------ No results for input(s): VITAMINB12, FOLATE, FERRITIN, TIBC, IRON, RETICCTPCT in the last 72 hours.  Coagulation profile No results for input(s): INR, PROTIME in the last 168 hours.  No results for input(s): DDIMER in the last 72 hours.  Cardiac Enzymes No results for input(s): CKMB, TROPONINI,  MYOGLOBIN in the last 168 hours.  Invalid input(s): CK ------------------------------------------------------------------------------------------------------------------    Component Value Date/Time   BNP 530.8 (H) 03/18/2020 1610    Micro Results Recent Results (from the past 240 hour(s))  SARS Coronavirus 2 by RT PCR (hospital order, performed in [facility] hospital lab) Nasopharyngeal Nasopharyngeal Swab     Status: None   Collection Time: 03/17/20  7:18 PM   Specimen: Nasopharyngeal Swab  Result Value Ref Range Status   SARS Coronavirus 2 NEGATIVE NEGATIVE Final    Comment: (NOTE) SARS-CoV-2 target nucleic acids are NOT DETECTED.  The SARS-CoV-2 RNA is generally detectable in upper and lower respiratory specimens during the acute phase of infection. The lowest concentration of SARS-CoV-2 viral copies this assay can detect is 250 copies / mL. A negative result does not preclude SARS-CoV-2 infection and should not be used as the sole basis for treatment or other patient management decisions.  A negative result may occur with improper specimen collection / handling, submission of specimen other than nasopharyngeal swab, presence of viral mutation(s) within the areas targeted by this assay, and inadequate number of viral copies (<250 copies / mL). A negative result must be combined with clinical observations, patient history, and epidemiological information.  Fact Sheet for Patients:   StrictlyIdeas.no  Fact Sheet for Healthcare  Providers: BankingDealers.co.za  This test is not yet approved or  cleared by the [place] FDA and has been authorized for detection and/or diagnosis of SARS-CoV-2 by FDA under an Emergency Use Authorization (EUA).  This EUA will remain in effect (meaning this test can be used) for the duration of the COVID-19 declaration under Section 564(b)(1) of the Act, 21 U.S.C. section 360bbb-3(b)(1), unless the authorization is terminated or revoked sooner.  Performed at [facility], [address] [place], [place] [address]   MRSA PCR Screening     Status: None   Collection Time: 03/17/20 11:47 PM   Specimen: Nasal Mucosa; Nasopharyngeal  Result Value Ref Range Status   MRSA by PCR NEGATIVE NEGATIVE Final    Comment:        The GeneXpert MRSA Assay (FDA approved for NASAL specimens only), is one component of a comprehensive MRSA colonization surveillance program. It is not intended to diagnose MRSA infection nor to guide or monitor treatment for MRSA infections. Performed at [facility], [place] [address], [place], [place] [address]     Radiology Reports CT Abdomen Pelvis Wo Contrast  Result Date: 03/17/2020 CLINICAL DATA:  Vomiting for 1 week, low sella diam, elevated BUN and creatinine EXAM: CT ABDOMEN AND PELVIS WITHOUT CONTRAST TECHNIQUE: Multidetector CT imaging of the abdomen and pelvis was performed following the standard protocol without IV contrast. COMPARISON:  Chest radiograph 12/26/2014, lumbar radiograph 08/13/2012, MR lumbar spine 06/16/2012 FINDINGS: Lower chest: Scarring and reticular changes noted in the lung bases, left greater than right. No consolidative opacity or pleural effusion. Normal heart size. No pericardial effusion. Tiny benign appearing macrocalcification versus fiducial in the right breast. Hepatobiliary: No visible focal liver lesions. Smooth liver surface contour. Normal hepatic attenuation. Gallbladder is  moderately distended. Some focal thickening is noted towards the gallbladder fundus, possible adenomyomatosis. A partially calcified gallstone noted layering dependently as well. No pericholecystic fluid or inflammation. Normal caliber biliary tree without visible calcified intraductal gallstones. Pancreas: Moderate pancreatic atrophy. No inflammation or ductal dilatation. No discernible pancreatic lesions. Spleen: Normal in size without focal abnormality. Adrenals/Urinary Tract: No concerning adrenal lesions. No visible or contour deforming  renal lesions. No urolithiasis or hydronephrosis. Urinary bladder is moderately distended with layering air-fluid level. No significant pericholecystic inflammation or urinary bladder wall thickening. Stomach/Bowel: Question some mild thickening versus underdistention of the stomach. Duodenum takes a normal course across the midline abdomen. No small bowel thickening or dilatation. Large volume of inspissated fecal material noted throughout the colon. Question some mild distal colonic thickening versus underdistention of the sigmoid. No adjacent inflammation, free fluid or evidence of perforation. No high-grade bowel obstruction. Vascular/Lymphatic: Atherosclerotic calcifications throughout the abdominal aorta and branch vessels. No aneurysm or ectasia. No enlarged abdominopelvic lymph nodes. Reproductive: Uterus is surgically absent. No concerning adnexal lesions. Other: No abdominopelvic free fluid or free gas. No bowel containing hernias. Small fat containing umbilical hernia. Musculoskeletal: Prior L3-L5 posterior spinal fusion. No evidence of hardware failure or acute complication is seen. Mild anterior wedging and Schmorl's node at the L1 superior endplate is similar to comparison radiographs. Diffuse discogenic and facet degenerative changes noted throughout the lumbar spine at both instrumented in non instrumented levels. Additional degenerative changes in the hips and  pelvis. The osseous structures appear diffusely demineralized which may limit detection of small or nondisplaced fractures. No acute or worrisome osseous lesions. IMPRESSION: 1. Large volume of inspissated fecal material noted throughout the colon. Mild distal colonic thickening versus underdistention of the sigmoid without evidence of inflammation. Correlate for features of constipation. Furthermore, could consider correlation with most recent colonoscopy or outpatient visualization if not recently performed. 2. Similar thickening versus underdistention of the stomach, could reflect gastritis or secondary change given patient's prolonged emesis. 3. Moderately distended urinary bladder with layering air-fluid level. Could reflect recent instrumentation/catheterization, however if urinary symptoms are present, recommend further evaluation with urinalysis to exclude cystitis. 4. Cholelithiasis without evidence of acute cholecystitis. Mild focal thickening at the gallbladder fundus is suspected to reflect adenomyomatosis but could correlate with outpatient right upper quadrant ultrasound. Could perform on acute basis if biliary symptoms are present. 5. Aortic Atherosclerosis (ICD10-I70.0). Electronically Signed   By: Lovena Le M.D.   On: 03/17/2020 19:37   X-ray chest PA and lateral  Result Date: 03/18/2020 CLINICAL DATA:  Acute renal insufficiency EXAM: CHEST - 2 VIEW COMPARISON:  December 26, 2014 FINDINGS: No pneumothorax. The cardiomediastinal silhouette is normal. No pulmonary nodules or masses. Mild bibasilar opacities, likely atelectasis. No overt edema. IMPRESSION: Probable mild bibasilar atelectasis. Recommend follow-up to resolution. No other acute abnormalities. Electronically Signed   By: Dorise Bullion III M.D   On: 03/18/2020 08:56    Time Spent in minutes  30   Lala Lund M.D on 03/18/2020 at 9:22 AM  To page go to [email or web address] - password [facility]

## 2020-03-18 NOTE — Progress Notes (Addendum)
Provider asked why patient was not on IV fluids as of yet today.   I advised that patient was out for a test early and came back just prior to shift change and was not given that information in report from previous RN staff.  According to notes patient was saline lock at 23:32 no indication that patient was on continuous fluid infusion.  Provider asked to have patient placed back on the IV fluids ASAP. I also advised that I was in doing assessment on other patients and was coming into her room at this time. Started her IV Fluids at ordered rates.  Gave patient all her PO medications also.

## 2020-03-18 NOTE — Progress Notes (Signed)
Patient ambulated to bathroom with my assistance to have bowel movement. Patient now sitting in chair at bedside.  Patient was complaining of Nausea gave her zofran IV.  Patient is resting in chair at this time and in no acute distress.

## 2020-03-18 NOTE — Progress Notes (Signed)
Patients daughter was here earlier and then just called to get an update on patient.  I gave her an update and advised patient is resting in room in no acute distress.  Daughter questioned why patient was being made NPO after midnight.  I advised I cannot find anything on file from the Nephologist notes and/or provider notes.  Will attempt to find out and let her know but at this time I'm not finding anything on fiile.

## 2020-03-18 NOTE — Progress Notes (Signed)
Patient back in bed and resting at this time in no acute distress.

## 2020-03-18 NOTE — H&P (Signed)
History and Physical    Jasmine White LOV:[id number] DOB: 1945-10-20 DOA: 03/17/2020  PCP: Chevis Pretty, FNP  Patient coming from: home  I have personally briefly reviewed patient's old medical records in [place]  Chief Complaint: n/v  HPI: Jasmine White is a 74 y.o. female with medical history significant of  Chronic back pain HTN, hypothyroidism, recently diagnosed CKD, HLD, Hx of COVID infection and recovery with persistent fatigue who presents with vomiting. Per patient states she has had intermittent n/v that started 9 days ago and has progressed to the point of where she is unable to tolerate po. She notes no associated abdominal pain, with these symptoms, or diarrhea. She states she was recently diagnosed with renal failure and was referred to nephrologist by pcp but has not had appointment as of yet.  She notes no abnormal taste, no confusion , no presyncope or near syncope, dizziness, muscle weakness, cramps or HA. She however has noted  Fatigue,dry cough, lower extremity paresthesias that have been on going s/p her recovery from [place] . On further ros she does not increase urinary frequency but no dysuria,she also noted frontal HA  Related to her history of post COVID syndrome.   ED Course: Afeb,  bp 181/73, hr 64, rr 18, sat 98% Lab: wbc 12.5/hgb 9.5, plt 429, lipase 94 NA:109 repeat 112 base 130's , cr 7.12 up from 2.28 up from 0.7 6 mo's ago CT scan:  -constipation ,mild colonic thickening :rec out pt cscope -gastric thickening /gastrtitis  -urinary bladder findings ? Cystitis  -cholelithiaisis no acute cholecystitis, mild focal thickening of gallbladder fundus? Adenomyomatosis-rec RUQ for further evaluation  Urine na 24,urine cr 44 use osmo 273,serum osmo 275 UA+: +protein,wbc 21-50, +bacteria, large LE tsh 1.209  Tx ed [place] of ns ,zofran   Dr Hollie Salk was consulted and recommended 50 cc ivf of NS  Review of Systems: As per HPI otherwise 10 point review  of systems negative.   Past Medical History:  Diagnosis Date  . Arthritis   . Bruises easily   . Cancer ([place])    basal skin cancer  . Chronic back pain    HNP/stenosis and radiculopathy  . Hyperlipidemia    takes Pravastatin daily  . Hypertension    takes Hyzaar and toprol daily  . Hypothyroidism    takes Synthroid daily  . Insomnia    takes Elevil nightly  . Joint pain   . Joint swelling   . Low BP    past some sedation  . Nocturia   . PONV (postoperative nausea and vomiting)    with knee replacement b/p dropped  . Sinusitis    finished zpak yesterday  . Urinary frequency     Past Surgical History:  Procedure Laterality Date  . ABDOMINAL HYSTERECTOMY    . BACK SURGERY    . basal cell skin cancer     on face and forehead  . bil knee replacements    . BREAST BIOPSY     left breast/benign  . COLONOSCOPY    . LUMBAR LAMINECTOMY/DECOMPRESSION MICRODISCECTOMY  08/13/2012   Procedure: LUMBAR LAMINECTOMY/DECOMPRESSION MICRODISCECTOMY 1 LEVEL;  Surgeon: Erline Levine, MD;  Location: [place] [facility];  Service: Neurosurgery;  Laterality: Left;  Left lumbar one-two Microdiskectomy     reports that she has never smoked. She has never used smokeless tobacco. She reports that she does not drink alcohol and does not use drugs.  Allergies  Allergen Reactions  . Percocet [Oxycodone-Acetaminophen] Nausea Only  .  Ultram [Tramadol]     Makes you crazy and nauseated    Family History  Problem Relation Age of Onset  . Hypertension Mother   . Heart disease Mother   . Stroke Father   . Heart disease Father   . Alcohol abuse Father   . Arthritis Brother   . Cancer Brother 76       prostate  . Arthritis Brother   . Colon cancer Neg Hx   . Colon polyps Neg Hx   . Stomach cancer Neg Hx   . Rectal cancer Neg Hx     Prior to Admission medications   Medication Sig Start Date End Date Taking? Authorizing Provider  amitriptyline (ELAVIL) 50 MG tablet Take 1 tablet (50 mg total) by  mouth at bedtime. 09/02/19   Hassell Done, [name], FNP  Coenzyme Q10 (CO Q-10) 200 MG CAPS Take 1 capsule by mouth daily.    [provider]  Garlic 8828 MG CAPS Take 1 capsule by mouth daily. Take 1000 mg daily    [provider]  HYDROcodone-acetaminophen (NORCO/VICODIN) 5-325 MG tablet Take 1 tablet by mouth every 6 (six) hours as needed for moderate pain. 03/01/20   Hassell Done, [name], FNP  losartan-hydrochlorothiazide (HYZAAR) 100-12.5 MG tablet Take 1 tablet by mouth daily. 03/01/20   Hassell Done, [name], FNP  Lutein 20 MG CAPS Take 1 capsule by mouth daily.    [provider]  metoprolol succinate (TOPROL-XL) 100 MG 24 hr tablet Take 1 tablet (100 mg total) by mouth daily. Take with or immediately following a meal. 03/01/20   Hassell Done, [name], FNP  Omega-3 Fatty Acids (FISH OIL) 1200 MG CAPS Take by mouth.    [provider]  Saline (SIMPLY SALINE) 0.9 % AERS Place 1 spray into the nose daily as needed. For sinuses    [provider]  SYNTHROID 88 MCG tablet TAKE 1 TABLET BY MOUTH ONCE DAILY BEFORE BREAKFAST 03/01/20   Chevis Pretty, FNP    Physical Exam: Vitals:   03/17/20 2030 03/17/20 2100 03/17/20 2200 03/17/20 2321  BP: (!) 134/107 (!) 158/63 (!) 156/67 (!) 155/85  Pulse: 73 70 66 68  Resp: (!) 24 (!) 22  18  Temp:    98.4 F (36.9 C)  TempSrc:    Oral  SpO2: 100% 100% 100% 98%  Weight:    63.5 kg  Height:    5\' 6"  (1.676 m)    Constitutional: NAD, calm, comfortable Vitals:   03/17/20 2030 03/17/20 2100 03/17/20 2200 03/17/20 2321  BP: (!) 134/107 (!) 158/63 (!) 156/67 (!) 155/85  Pulse: 73 70 66 68  Resp: (!) 24 (!) 22  18  Temp:    98.4 F (36.9 C)  TempSrc:    Oral  SpO2: 100% 100% 100% 98%  Weight:    63.5 kg  Height:    5\' 6"  (1.676 m)   Eyes: PERRL, lids and conjunctivae normal ENMT: Mucous membranes are moist. Posterior pharynx clear of any exudate or lesions.Normal dentition.  Neck: normal,  supple, no masses, no thyromegaly Respiratory: clear to auscultation bilaterally, no wheezing, no crackles. Normal respiratory effort. No accessory muscle use.  Cardiovascular: Regular rate and rhythm, no murmurs / rubs / gallops. No extremity edema. 2+ pedal pulses. No carotid bruits.  Abdomen: no tenderness, no masses palpated. No hepatosplenomegaly. Bowel sounds positive.  Musculoskeletal: no clubbing / cyanosis. No joint deformity upper and lower extremities. Good ROM, no contractures. Normal muscle tone.  Skin: no rashes, lesions, ulcers. No induration  Neurologic: CN 2-12 grossly intact. Sensation intact, DTR normal. Strength 5/5 in all 4.  Psychiatric: Normal judgment and insight. Alert and oriented x 3. Normal mood.    Labs on Admission: I have personally reviewed following labs and imaging studies  CBC: Recent Labs  Lab 03/17/20 1821  WBC 12.5*  HGB 9.5*  HCT 26.8*  MCV 75.7*  PLT 330*   Basic Metabolic Panel: Recent Labs  Lab 03/17/20 1821 03/17/20 2043  NA 109* 112*  K 3.5 3.4*  CL 68* 73*  CO2 21* 20*  GLUCOSE 102* 95  BUN 118* 116*  CREATININE 7.12* 7.25*  CALCIUM 8.7* 8.1*   GFR: Estimated Creatinine Clearance: 6.4 mL/min (A) (by C-G formula based on SCr of 7.25 mg/dL (H)). Liver Function Tests: Recent Labs  Lab 03/17/20 1821  AST 19  ALT 13  ALKPHOS 71  BILITOT 0.7  PROT 7.8  ALBUMIN 3.2*   Recent Labs  Lab 03/17/20 1821  LIPASE 94*   No results for input(s): AMMONIA in the last 168 hours. Coagulation Profile: No results for input(s): INR, PROTIME in the last 168 hours. Cardiac Enzymes: No results for input(s): CKTOTAL, CKMB, CKMBINDEX, TROPONINI in the last 168 hours. BNP (last 3 results) No results for input(s): PROBNP in the last 8760 hours. HbA1C: No results for input(s): HGBA1C in the last 72 hours. CBG: No results for input(s): GLUCAP in the last 168 hours. Lipid Profile: No results for input(s): CHOL, HDL, LDLCALC, TRIG, CHOLHDL,  LDLDIRECT in the last 72 hours. Thyroid Function Tests: Recent Labs    03/17/20 2043  TSH 1.209   Anemia Panel: No results for input(s): VITAMINB12, FOLATE, FERRITIN, TIBC, IRON, RETICCTPCT in the last 72 hours. Urine analysis:    Component Value Date/Time   COLORURINE YELLOW 03/17/2020 2010   APPEARANCEUR CLOUDY (A) 03/17/2020 2010   APPEARANCEUR Clear 03/03/2019 0920   LABSPEC 1.015 03/17/2020 2010   PHURINE 5.5 03/17/2020 2010   GLUCOSEU NEGATIVE 03/17/2020 2010   HGBUR LARGE (A) 03/17/2020 2010   BILIRUBINUR NEGATIVE 03/17/2020 2010   BILIRUBINUR Negative 03/03/2019 0920   KETONESUR NEGATIVE 03/17/2020 2010   PROTEINUR 100 (A) 03/17/2020 2010   UROBILINOGEN negative 12/21/2012 1509   NITRITE NEGATIVE 03/17/2020 2010   LEUKOCYTESUR LARGE (A) 03/17/2020 2010    Radiological Exams on Admission: CT Abdomen Pelvis Wo Contrast  Result Date: 03/17/2020 CLINICAL DATA:  Vomiting for 1 week, low sella diam, elevated BUN and creatinine EXAM: CT ABDOMEN AND PELVIS WITHOUT CONTRAST TECHNIQUE: Multidetector CT imaging of the abdomen and pelvis was performed following the standard protocol without IV contrast. COMPARISON:  Chest radiograph 12/26/2014, lumbar radiograph 08/13/2012, MR lumbar spine 06/16/2012 FINDINGS: Lower chest: Scarring and reticular changes noted in the lung bases, left greater than right. No consolidative opacity or pleural effusion. Normal heart size. No pericardial effusion. Tiny benign appearing macrocalcification versus fiducial in the right breast. Hepatobiliary: No visible focal liver lesions. Smooth liver surface contour. Normal hepatic attenuation. Gallbladder is moderately distended. Some focal thickening is noted towards the gallbladder fundus, possible adenomyomatosis. A partially calcified gallstone noted layering dependently as well. No pericholecystic fluid or inflammation. Normal caliber biliary tree without visible calcified intraductal gallstones. Pancreas:  Moderate pancreatic atrophy. No inflammation or ductal dilatation. No discernible pancreatic lesions. Spleen: Normal in size without focal abnormality. Adrenals/Urinary Tract: No concerning adrenal lesions. No visible or contour deforming renal lesions. No urolithiasis or hydronephrosis. Urinary bladder is moderately distended with layering air-fluid level. No significant pericholecystic inflammation or urinary bladder wall  thickening. Stomach/Bowel: Question some mild thickening versus underdistention of the stomach. Duodenum takes a normal course across the midline abdomen. No small bowel thickening or dilatation. Large volume of inspissated fecal material noted throughout the colon. Question some mild distal colonic thickening versus underdistention of the sigmoid. No adjacent inflammation, free fluid or evidence of perforation. No high-grade bowel obstruction. Vascular/Lymphatic: Atherosclerotic calcifications throughout the abdominal aorta and branch vessels. No aneurysm or ectasia. No enlarged abdominopelvic lymph nodes. Reproductive: Uterus is surgically absent. No concerning adnexal lesions. Other: No abdominopelvic free fluid or free gas. No bowel containing hernias. Small fat containing umbilical hernia. Musculoskeletal: Prior L3-L5 posterior spinal fusion. No evidence of hardware failure or acute complication is seen. Mild anterior wedging and Schmorl's node at the L1 superior endplate is similar to comparison radiographs. Diffuse discogenic and facet degenerative changes noted throughout the lumbar spine at both instrumented in non instrumented levels. Additional degenerative changes in the hips and pelvis. The osseous structures appear diffusely demineralized which may limit detection of small or nondisplaced fractures. No acute or worrisome osseous lesions. IMPRESSION: 1. Large volume of inspissated fecal material noted throughout the colon. Mild distal colonic thickening versus underdistention of the  sigmoid without evidence of inflammation. Correlate for features of constipation. Furthermore, could consider correlation with most recent colonoscopy or outpatient visualization if not recently performed. 2. Similar thickening versus underdistention of the stomach, could reflect gastritis or secondary change given patient's prolonged emesis. 3. Moderately distended urinary bladder with layering air-fluid level. Could reflect recent instrumentation/catheterization, however if urinary symptoms are present, recommend further evaluation with urinalysis to exclude cystitis. 4. Cholelithiasis without evidence of acute cholecystitis. Mild focal thickening at the gallbladder fundus is suspected to reflect adenomyomatosis but could correlate with outpatient right upper quadrant ultrasound. Could perform on acute basis if biliary symptoms are present. 5. Aortic Atherosclerosis (ICD10-I70.0). Electronically Signed   By: Lovena Le M.D.   On: 03/17/2020 19:37    EKG: Independently reviewed.see above  Assessment/Plan Severe hypovolemic hyponatremia  -multifactorial due to severe dehydration in setting of n/v and use of HCTZ  -no other symptom other than n/v , no neuro symptoms  -ns at 50 cc/hr per renal  - NA check q6 hours  -neuro checks   AKI on recently diagnosed CKD -due to severe dehydration/infection and medication side-effect  -strict I/o , UOP 350 in ed  -await final renal recs   UTI -ctx  -f/u on culture data     HTN  -uncontrolled in ed  -improved on floor  - hold nephrotoxic for now  - hydralazine tid for now   Anemia  -noted low mcv - check anemia panel   Constipation  -place on bowel regimen  Colon thickening on CT  -will need out patient c-scope  -pcp to follow on discharge    Gastritis  -due to hx of n/v  -supportive care with ppi   -mild focal thickening of gallbladder fundus? Adenomyomatosis -rec RUQ for further evaluation  FEN  Potassium stable  Check mag and  phos     DVT prophylaxis: Heparin  Code Status:Full  Family Communication: N/A Disposition Plan: 3-4 days  Consults called: DR Hollie Salk  Admission status: SDU   Clance Boll MD [facility]  If 7PM-7AM, please contact night-coverage [email or web address] Password [email or web address]  03/18/2020, 1:03 AM

## 2020-03-18 NOTE — Progress Notes (Signed)
CRITICAL VALUE ALERT  Critical Value:  Sodium 112  Date & Time Notied:  03/18/2020 0248  Provider Notified: Mansy  Orders Received/Actions taken: awaiting orders

## 2020-03-18 NOTE — Progress Notes (Signed)
Nephrology provider  in room at this time.

## 2020-03-18 NOTE — Progress Notes (Addendum)
Patient tech placed patient on bed pan per request.   I removed patient from bed pan and gave CHG bath for patient. Assisted patient to position of comfort, with pillow between her legs per her request.

## 2020-03-18 NOTE — Progress Notes (Addendum)
-   reviewed delayed serum Na draw - Na 112 at 1645 - pt is mildly hypertensive - concern re: mixed picture of hypovolemic hyponatremia and SIADH - Repeat serum osms, urine Na and urine osms - recheck Na at 2000 - NS down to 50 mL/ hr - 20 mEq K for low K - close monitoring - if no improvement, may need to consider 3%  Madelon Lips MD [facility]

## 2020-03-18 NOTE — Progress Notes (Signed)
Lab called stated they did not or cannot find a blood tube for the patients ordered D dimer. Lab stated that she will put another order in stat to have this completed. Lab stated they looked everywhere for the tube but cannot find it.

## 2020-03-19 ENCOUNTER — Inpatient Hospital Stay (HOSPITAL_COMMUNITY): Payer: Medicare PPO

## 2020-03-19 LAB — CBC WITH DIFFERENTIAL/PLATELET
Abs Immature Granulocytes: 0.06 10*3/uL (ref 0.00–0.07)
Basophils Absolute: 0 10*3/uL (ref 0.0–0.1)
Basophils Relative: 0 %
Eosinophils Absolute: 0.1 10*3/uL (ref 0.0–0.5)
Eosinophils Relative: 0 %
HCT: 23.6 % — ABNORMAL LOW (ref 36.0–46.0)
Hemoglobin: 8.4 g/dL — ABNORMAL LOW (ref 12.0–15.0)
Immature Granulocytes: 1 %
Lymphocytes Relative: 7 %
Lymphs Abs: 0.8 10*3/uL (ref 0.7–4.0)
MCH: 27 pg (ref 26.0–34.0)
MCHC: 35.6 g/dL (ref 30.0–36.0)
MCV: 75.9 fL — ABNORMAL LOW (ref 80.0–100.0)
Monocytes Absolute: 0.6 10*3/uL (ref 0.1–1.0)
Monocytes Relative: 5 %
Neutro Abs: 10.3 10*3/uL — ABNORMAL HIGH (ref 1.7–7.7)
Neutrophils Relative %: 87 %
Platelets: 331 10*3/uL (ref 150–400)
RBC: 3.11 MIL/uL — ABNORMAL LOW (ref 3.87–5.11)
RDW: 13.2 % (ref 11.5–15.5)
WBC: 11.8 10*3/uL — ABNORMAL HIGH (ref 4.0–10.5)
nRBC: 0 % (ref 0.0–0.2)

## 2020-03-19 LAB — MPO/PR-3 (ANCA) ANTIBODIES
ANCA Proteinase 3: <3.5 U/mL (ref 0.0–3.5)
Myeloperoxidase Abs: 10.3 U/mL — ABNORMAL HIGH (ref 0.0–9.0)

## 2020-03-19 LAB — BRAIN NATRIURETIC PEPTIDE: B Natriuretic Peptide: 1430.6 pg/mL — ABNORMAL HIGH (ref 0.0–100.0)

## 2020-03-19 LAB — COMPREHENSIVE METABOLIC PANEL
ALT: 11 U/L (ref 0–44)
AST: 18 U/L (ref 15–41)
Albumin: 2.6 g/dL — ABNORMAL LOW (ref 3.5–5.0)
Alkaline Phosphatase: 60 U/L (ref 38–126)
Anion gap: 19 — ABNORMAL HIGH (ref 5–15)
BUN: 121 mg/dL — ABNORMAL HIGH (ref 8–23)
CO2: 18 mmol/L — ABNORMAL LOW (ref 22–32)
Calcium: 8.4 mg/dL — ABNORMAL LOW (ref 8.9–10.3)
Chloride: 77 mmol/L — ABNORMAL LOW (ref 98–111)
Creatinine, Ser: 8.5 mg/dL — ABNORMAL HIGH (ref 0.44–1.00)
GFR calc Af Amer: 5 mL/min — ABNORMAL LOW (ref >60)
GFR calc non Af Amer: 4 mL/min — ABNORMAL LOW (ref >60)
Glucose, Bld: 91 mg/dL (ref 70–99)
Potassium: 4.2 mmol/L (ref 3.5–5.1)
Sodium: 114 mmol/L — CL (ref 135–145)
Total Bilirubin: 0.5 mg/dL (ref 0.3–1.2)
Total Protein: 6.6 g/dL (ref 6.5–8.1)

## 2020-03-19 LAB — TYPE AND SCREEN
ABO/RH(D): O POS
Antibody Screen: NEGATIVE

## 2020-03-19 LAB — KAPPA/LAMBDA LIGHT CHAINS
Kappa free light chain: 142.4 mg/L — ABNORMAL HIGH (ref 3.3–19.4)
Kappa, lambda light chain ratio: 1.37 (ref 0.26–1.65)
Lambda free light chains: 104.1 mg/L — ABNORMAL HIGH (ref 5.7–26.3)

## 2020-03-19 LAB — BASIC METABOLIC PANEL
Anion gap: 19 — ABNORMAL HIGH (ref 5–15)
BUN: 120 mg/dL — ABNORMAL HIGH (ref 8–23)
CO2: 17 mmol/L — ABNORMAL LOW (ref 22–32)
Calcium: 8.3 mg/dL — ABNORMAL LOW (ref 8.9–10.3)
Chloride: 81 mmol/L — ABNORMAL LOW (ref 98–111)
Creatinine, Ser: 8.76 mg/dL — ABNORMAL HIGH (ref 0.44–1.00)
GFR calc Af Amer: 5 mL/min — ABNORMAL LOW (ref >60)
GFR calc non Af Amer: 4 mL/min — ABNORMAL LOW (ref >60)
Glucose, Bld: 98 mg/dL (ref 70–99)
Potassium: 4.2 mmol/L (ref 3.5–5.1)
Sodium: 117 mmol/L — CL (ref 135–145)

## 2020-03-19 LAB — FOLATE: Folate: 55.5 ng/mL (ref >5.9)

## 2020-03-19 LAB — ABO/RH: ABO/RH(D): O POS

## 2020-03-19 LAB — PROTEIN ELECTROPHORESIS, SERUM
A/G Ratio: 0.7 (ref 0.7–1.7)
Albumin ELP: 2.8 g/dL — ABNORMAL LOW (ref 2.9–4.4)
Alpha-1-Globulin: 0.4 g/dL (ref 0.0–0.4)
Alpha-2-Globulin: 1.1 g/dL — ABNORMAL HIGH (ref 0.4–1.0)
Beta Globulin: 1 g/dL (ref 0.7–1.3)
Gamma Globulin: 1.5 g/dL (ref 0.4–1.8)
Globulin, Total: 3.9 g/dL (ref 2.2–3.9)
Total Protein ELP: 6.7 g/dL (ref 6.0–8.5)

## 2020-03-19 LAB — SODIUM
Sodium: 117 mmol/L — CL (ref 135–145)
Sodium: 116 mmol/L — CL (ref 135–145)

## 2020-03-19 LAB — RETICULOCYTES
Immature Retic Fract: 2.9 % (ref 2.3–15.9)
RBC.: 3.18 MIL/uL — ABNORMAL LOW (ref 3.87–5.11)
Retic Count, Absolute: 29.6 10*3/uL (ref 19.0–186.0)
Retic Ct Pct: 0.9 % (ref 0.4–3.1)

## 2020-03-19 LAB — C3 COMPLEMENT: C3 Complement: 123 mg/dL (ref 82–167)

## 2020-03-19 LAB — IRON AND TIBC
Iron: 98 ug/dL (ref 28–170)
Saturation Ratios: 42 % — ABNORMAL HIGH (ref 10.4–31.8)
TIBC: 235 ug/dL — ABNORMAL LOW (ref 250–450)
UIBC: 137 ug/dL

## 2020-03-19 LAB — C-REACTIVE PROTEIN: CRP: 11.4 mg/dL — ABNORMAL HIGH (ref <1.0)

## 2020-03-19 LAB — HEPATITIS B SURFACE ANTIBODY, QUANTITATIVE: Hep B S AB Quant (Post): <3.1 m[IU]/mL — ABNORMAL LOW (ref >9.9)

## 2020-03-19 LAB — VITAMIN B12: Vitamin B-12: 2263 pg/mL — ABNORMAL HIGH (ref 180–914)

## 2020-03-19 LAB — MAGNESIUM: Magnesium: 2.8 mg/dL — ABNORMAL HIGH (ref 1.7–2.4)

## 2020-03-19 LAB — ANTISTREPTOLYSIN O TITER: ASO: 35 IU/mL (ref 0.0–200.0)

## 2020-03-19 LAB — C4 COMPLEMENT: Complement C4, Body Fluid: 32 mg/dL (ref 12–38)

## 2020-03-19 LAB — GLOMERULAR BASEMENT MEMBRANE ANTIBODIES: GBM Ab: 137 units — ABNORMAL HIGH (ref 0–20)

## 2020-03-19 LAB — D-DIMER, QUANTITATIVE: D-Dimer, Quant: 3.3 ug/mL-FEU — ABNORMAL HIGH (ref 0.00–0.50)

## 2020-03-19 LAB — PROCALCITONIN: Procalcitonin: 0.22 ng/mL

## 2020-03-19 LAB — FERRITIN: Ferritin: 565 ng/mL — ABNORMAL HIGH (ref 11–307)

## 2020-03-19 MED ORDER — SEVELAMER CARBONATE 800 MG PO TABS
800.0000 mg | ORAL_TABLET | Freq: Three times a day (TID) | ORAL | Status: DC
  Administered 2020-03-19 (×2): 800 mg via ORAL
  Filled 2020-03-19: qty 1

## 2020-03-19 MED ORDER — SODIUM BICARBONATE 650 MG PO TABS
650.0000 mg | ORAL_TABLET | Freq: Two times a day (BID) | ORAL | Status: DC
  Administered 2020-03-19 – 2020-03-20 (×2): 650 mg via ORAL
  Filled 2020-03-19 (×2): qty 1

## 2020-03-19 MED ORDER — SODIUM CHLORIDE 0.9 % IV SOLN: INTRAVENOUS | Status: DC

## 2020-03-19 MED ORDER — LACTATED RINGERS IV SOLN: INTRAVENOUS | Status: DC

## 2020-03-19 MED ORDER — SODIUM CHLORIDE 0.9 % IV BOLUS
250.0000 mL | Freq: Once | INTRAVENOUS | Status: AC
  Administered 2020-03-19: 250 mL via INTRAVENOUS

## 2020-03-19 MED ORDER — SODIUM BICARBONATE 650 MG PO TABS: 1300.0000 mg | ORAL_TABLET | Freq: Two times a day (BID) | ORAL | Status: DC

## 2020-03-19 NOTE — Progress Notes (Signed)
Patient sitting upright in chair.

## 2020-03-19 NOTE — Progress Notes (Signed)
Orders  Dr. Harrie Jeans  Please notify nephrology on call if sodium rises to 123 or above before 6/29 at 7am. Please also notify nephrology on call if her sodium falls (last was 116).    I advised will relay information in report to the next shift.

## 2020-03-19 NOTE — Progress Notes (Signed)
New orders received from Dr. Harrie Jeans  If sodium rises to 123 or higher before 7am on 6/29 please stop normal saline and contact nephrology on call.. Will pass information along to next shift in report also.

## 2020-03-19 NOTE — Evaluation (Signed)
Physical Therapy Evaluation Patient Details Name: Jasmine White MRN: [id number] DOB: 1946-05-13 Today's Date: 03/19/2020   History of Present Illness  74 y.o. female with medical history significant of chronic back pain, HTN, hypothyroidism, recently diagnosed CKD, HLD, Hx of COVID infection and recovery with persistent fatigue who presented 03/18/20 with nausea/vomiting x 9 days. Severe hypovolemic hyponatremia, AKI, UTI, HTN  Clinical Impression   Pt admitted with above diagnosis. Patient reports near falls at home with use of RW. Reports her husband is independent with his wheelchair (has RA). She was mildly confused with varying answeres to home set up and prior function. She was primarily limited by nausea and dry heaves, however also noted global weakness and unsteadiness. Patient continues to undergo medical workup. Likely will need to consult CIR for additional therapies prior to home, but will continue to assess. Pt currently with functional limitations due to the deficits listed below (see PT Problem List). Pt will benefit from skilled PT to increase their independence and safety with mobility to allow discharge to the venue listed below.       Follow Up Recommendations Other (comment) (to be assessed as medical workup completed; may need CIR)    Equipment Recommendations  None recommended by PT    Recommendations for Other Services OT consult     Precautions / Restrictions Precautions Precautions: Fall      Mobility  Bed Mobility Overal bed mobility: Needs Assistance Bed Mobility: Supine to Sit;Sit to Supine     Supine to sit: Supervision Sit to supine: Modified independent (Device/Increase time)   General bed mobility comments: supervision due to mild dizziness; return to bed with HOB slightly elevated due to nausea  Transfers Overall transfer level: Needs assistance Equipment used: Rolling walker (2 wheeled) Transfers: Sit to/from Stand Sit to Stand: Min guard          General transfer comment: vc for hand placement from bed and from toilet with grab bar  Ambulation/Gait Ambulation/Gait assistance: Min guard Gait Distance (Feet): 15 Feet (x2) Assistive device: Rolling walker (2 wheeled) Gait Pattern/deviations: Step-through pattern;Decreased stride length;Trunk flexed     General Gait Details: pushes RW too far ahead with flexed torso  Stairs            Wheelchair Mobility    Modified Rankin (Stroke Patients Only)       Balance Overall balance assessment: Mild deficits observed, not formally tested                                           Pertinent Vitals/Pain Pain Assessment: No/denies pain    Home Living Family/patient expects to be discharged to:: Private residence Living Arrangements: Spouse/significant other Available Help at Discharge: Family;Other (Comment) (spouse in wheelchair due to RA; modified indep ) Type of Home: House Home Access: Ramped entrance     Home Layout: Two level;Able to live on main level with bedroom/bathroom Home Equipment: Grab bars - tub/shower;Shower seat - built in;Walker - 2 wheels;Wheelchair - manual      Prior Function Level of Independence: Independent with assistive device(s)         Comments: uses RW due to "don't know where my feet are sometimes" drives; uses drive-up/pick up for groceries     Hand Dominance        Extremity/Trunk Assessment   Upper Extremity Assessment Upper Extremity Assessment: Generalized weakness  Lower Extremity Assessment Lower Extremity Assessment: Generalized weakness    Cervical / Trunk Assessment Cervical / Trunk Assessment: Normal  Communication   Communication: No difficulties  Cognition Arousal/Alertness: Awake/alert Behavior During Therapy: Flat affect Overall Cognitive Status: No family/caregiver present to determine baseline cognitive functioning                                 General  Comments: requires follow-up questions to clarify many of her answers (initially stating she helps her husband, when asked what she helps him with she could not say, when asked specifically bathing?dressing? up/down ramp? she stated he does those things without assist. She then stated he does everything for himself at wheelchair level)      General Comments General comments (skin integrity, edema, etc.): HR and sats stable during mobility. Urgently going to bathroom and unable to get orthostatic BPs to assess BP response    Exercises     Assessment/Plan    PT Assessment Patient needs continued PT services  PT Problem List Decreased strength;Decreased activity tolerance;Decreased balance;Decreased mobility;Decreased cognition;Decreased knowledge of use of DME;Decreased safety awareness (?impaired LE sensation based on her comments)       PT Treatment Interventions DME instruction;Gait training;Functional mobility training;Therapeutic activities;Therapeutic exercise;Balance training;Cognitive remediation;Patient/family education    PT Goals (Current goals can be found in the Care Plan section)  Acute Rehab PT Goals Patient Stated Goal: get strong enough to go home Time For Goal Achievement: 04/02/20 Potential to Achieve Goals: Fair    Frequency Min 3X/week   Barriers to discharge        Co-evaluation               AM-PAC PT "6 Clicks" Mobility  Outcome Measure Help needed turning from your back to your side while in a flat bed without using bedrails?: None Help needed moving from lying on your back to sitting on the side of a flat bed without using bedrails?: A Little Help needed moving to and from a bed to a chair (including a wheelchair)?: A Little Help needed standing up from a chair using your arms (e.g., wheelchair or bedside chair)?: A Little Help needed to walk in hospital room?: A Little Help needed climbing 3-5 steps with a railing? : A Little 6 Click Score:  19    End of Session Equipment Utilized During Treatment: Gait belt Activity Tolerance: Treatment limited secondary to medical complications (Comment) (nausea with dry heaves) Patient left: in bed;with call bell/phone within reach;with bed alarm set Nurse Communication: Mobility status;Other (comment) (dry heaves) PT Visit Diagnosis: Muscle weakness (generalized) (M62.81);Difficulty in walking, not elsewhere classified (R26.2);Dizziness and giddiness (R42)    Time: 4401-0272 PT Time Calculation (min) (ACUTE ONLY): 31 min   Charges:   PT Evaluation $PT Eval Low Complexity: 1 Low PT Treatments $Gait Training: 8-22 mins         Arby Barrette, PT Pager [phone]   Rexanne Mano 03/19/2020, 11:17 AM

## 2020-03-19 NOTE — Plan of Care (Addendum)
I have re-entered NPO order - NPO to better control her Na.  Per nursing she's had a diet for part of the day and has had water.    Variable IV fluids today with multiple teams responding to critical Na levels which has complicated management.  Still does appear clinically dehydrated - defer 3% or samsca 2/2 same.    Ordered to please stop normal saline and notify nephrology on call if sodium rises to 123 or above before 6/29 at 7am.  Please also notify nephrology on call if her sodium falls (last was 116).  Claudia Desanctis 03/19/2020 5:17 PM

## 2020-03-19 NOTE — Progress Notes (Signed)
Occupational Therapy Evaluation Patient Details Name: Jasmine White MRN: [id number] DOB: July 12, 1946 Today's Date: 03/19/2020    History of Present Illness 74 y.o. female with medical history significant of chronic back pain, HTN, hypothyroidism, recently diagnosed CKD, HLD, Hx of COVID infection and recovery with persistent fatigue who presented 03/18/20 with nausea/vomiting x 9 days. Severe hypovolemic hyponatremia, AKI, UTI, HTN   Clinical Impression   Patient lives at home with spouse where she is typically independent with devices.  Today patient presenting with weakness, decreased cognition, and poor balance affecting safety with ADLs and mobility.  She was able to stand with RW and walk to sink with min guard.  Patient requiring cueing for initiation and sequencing.  Appears very flat and dazed.  Patient nauseous throughout session and fatigued.  After 3 min stand patient needing to return to seated and rest.  Required min guard with standing UB ADLs and min assist with LB.  Will continue to follow with OT acutely to address the deficits listed below.      Follow Up Recommendations  CIR    Equipment Recommendations  None recommended by OT    Recommendations for Other Services       Precautions / Restrictions Precautions Precautions: Fall Restrictions Weight Bearing Restrictions: No      Mobility Bed Mobility Overal bed mobility: Needs Assistance Bed Mobility: Supine to Sit;Sit to Supine     Supine to sit: Supervision Sit to supine: Modified independent (Device/Increase time)      Transfers Overall transfer level: Needs assistance Equipment used: Rolling walker (2 wheeled) Transfers: Sit to/from Stand Sit to Stand: Min guard              Balance Overall balance assessment: Mild deficits observed, not formally tested                                         ADL either performed or assessed with clinical judgement   ADL Overall ADL's :  Needs assistance/impaired Eating/Feeding: Independent;Sitting   Grooming: Min guard;Cueing for sequencing;Standing   Upper Body Bathing: Min guard;Standing   Lower Body Bathing: Minimal assistance;Sit to/from stand   Upper Body Dressing : Min guard;Standing   Lower Body Dressing: Minimal assistance;Sit to/from stand   Toilet Transfer: Secretary/administrator mobility during ADLs: Banker      Pertinent Vitals/Pain Pain Assessment: No/denies pain     Hand Dominance Right   Extremity/Trunk Assessment Upper Extremity Assessment Upper Extremity Assessment: Generalized weakness   Lower Extremity Assessment Lower Extremity Assessment: Defer to PT evaluation   Cervical / Trunk Assessment Cervical / Trunk Assessment: Normal   Communication Communication Communication: No difficulties   Cognition Arousal/Alertness: Awake/alert Behavior During Therapy: Flat affect Overall Cognitive Status: No family/caregiver present to determine baseline cognitive functioning                                 General Comments: Poor initiation and processing time.  Required increased cues to begin taks.  Delayed responses.  Unsure if she is just "foggy" due to feeling sick/nauseous.     General Comments  Patient nauseous during session  Exercises     Shoulder Instructions      Home Living Family/patient expects to be discharged to:: Private residence Living Arrangements: Spouse/significant other Available Help at Discharge: Family;Other (Comment) Type of Home: House Home Access: Ramped entrance     Home Layout: Two level;Able to live on main level with bedroom/bathroom     Bathroom Shower/Tub: Walk-in shower     Bathroom Accessibility: Yes   Home Equipment: Grab bars - tub/shower;Shower seat - built in;Walker - 2 wheels;Wheelchair - manual          Prior  Functioning/Environment Level of Independence: Independent with assistive device(s)        Comments: Uses RW sometimes        OT Problem List: Decreased strength;Decreased activity tolerance;Impaired balance (sitting and/or standing);Decreased cognition;Decreased safety awareness      OT Treatment/Interventions: Self-care/ADL training;Therapeutic exercise;Energy conservation;Therapeutic activities;Patient/family education;Cognitive remediation/compensation;Balance training    OT Goals(Current goals can be found in the care plan section) Acute Rehab OT Goals Patient Stated Goal: get strong enough to go home OT Goal Formulation: With patient Time For Goal Achievement: 04/02/20 Potential to Achieve Goals: Good  OT Frequency: Min 2X/week   Barriers to D/C:            Co-evaluation              AM-PAC OT "6 Clicks" Daily Activity     Outcome Measure Help from another person eating meals?: None Help from another person taking care of personal grooming?: A Little Help from another person toileting, which includes using toliet, bedpan, or urinal?: A Little Help from another person bathing (including washing, rinsing, drying)?: A Little Help from another person to put on and taking off regular upper body clothing?: A Little Help from another person to put on and taking off regular lower body clothing?: A Little 6 Click Score: 19   End of Session Equipment Utilized During Treatment: Gait belt;Rolling walker Nurse Communication: Mobility status  Activity Tolerance: Patient tolerated treatment well;Treatment limited secondary to medical complications (Comment) (nauseous) Patient left: in bed;with call bell/phone within reach  OT Visit Diagnosis: Unsteadiness on feet (R26.81);Muscle weakness (generalized) (M62.81);Other symptoms and signs involving cognitive function                Time: 1400-1418 OT Time Calculation (min): 18 min Charges:  OT General Charges $OT Visit: 1  Visit OT Evaluation $OT Eval Moderate Complexity: 1 [address], OTR/L   Phylliss Bob 03/19/2020, 3:46 PM

## 2020-03-19 NOTE — Progress Notes (Addendum)
PROGRESS NOTE                                                                                                                                                                                                             Patient Demographics:    Jasmine White, is a 74 y.o. female, DOB - Jan 30, 1946, PZW:[id number]  Admit date - 03/17/2020   Admitting Physician Clance Boll, MD  Outpatient Primary MD for the patient is Chevis Pretty, Aristes  LOS - 2  Chief Complaint  Patient presents with  . Emesis       Brief Narrative -  Jasmine White is a 74 y.o. female with medical history significant of  Chronic back pain HTN, hypothyroidism, recently diagnosed CKD, HLD, Hx of COVID infection and recovery with persistent fatigue who presents with vomiting. Per patient states she has had intermittent n/v that started 9 days ago, in the ER she had changes suggestive of severe constipation and stool burden on CT scan, her blood numbers were suggestive of UTI, severe dehydration, hypernatremia and AKI.  She was admitted for further treatment.   Subjective:   Patient in bed, appears comfortable, denies any headache, no fever, no chest pain or pressure, no shortness of breath , no abdominal pain. No focal weakness, but feels weak all over.   Assessment  & Plan :     1.  AKI with severe dehydration and hyponatremia.  Likely brought on by HCTZ, UTI and superimposed nausea vomiting, function about 1 year ago appears stable, CT scan does not show any acute obstruction.  Her Roney Mans was greater than 1 but she was also using HCTZ, nephrology is on board and she is being hydrated, most of her symptoms could be explained by extreme dehydration and HCTZ use however intrinsic renal disease cannot be ruled out, continue to hydrate, monitor sodium closely, nephrology managing fluid status, if no improvement may require renal biopsy and HD.  2.  TIA.  On Rocephin.  3.  Hypothyroidism.  Continue home  dose Synthroid, TSH is stable.  4.  Essential hypertension.  On beta-blocker, hydralazine added.  Will monitor and adjust.  5.  Chronic microcytic anemia.  Check anemia panel.  Was recently placed on iron supplementation by PCP, if iron deficient will defer to PCP on iron deficiency anemia work-up.  For now placed on PPI.  6.  Severe stool burden and constipation.  Placed on aggressive bowel regimen.  Abdominal exam benign.  7.  Weakness and  deconditioning.  PT OT.    Condition - Extremely Guarded  Family Communication  : Daughter Jasmine White [phone] on 03/18/2020  Code Status :  Full  Consults  :  None  Procedures  :   CT - 1. Large volume of inspissated fecal material noted throughout the colon. Mild distal colonic thickening versus underdistention of the sigmoid without evidence of inflammation. Correlate for features of constipation. Furthermore, could consider correlation with most recent colonoscopy or outpatient visualization if not recently performed. 2. Similar thickening versus underdistention of the stomach, could reflect gastritis or secondary change given patient's prolonged emesis. 3. Moderately distended urinary bladder with layering air-fluid level. Could reflect recent instrumentation/catheterization, however if urinary symptoms are present, recommend further evaluation with urinalysis to exclude cystitis. 4. Cholelithiasis without evidence of acute cholecystitis. Mild focal thickening at the gallbladder fundus is suspected to reflect adenomyomatosis but could correlate with outpatient right upper quadrant ultrasound. Could perform on acute basis if biliary symptoms are present. 5. Aortic Atherosclerosis (ICD10-I70.0).  PUD Prophylaxis : PPI  Disposition Plan  :    Status is: Inpatient  Remains inpatient appropriate because:IV treatments appropriate due to intensity of illness or inability to take PO   Dispo: The patient is from: Home              Anticipated d/c is  to: SNF              Anticipated d/c date is: 3 days              Patient currently is not medically stable to d/c.   DVT Prophylaxis  :   Heparin    Lab Results  Component Value Date   PLT 331 03/19/2020    Diet :  Diet Order            Diet NPO time specified  Diet effective now                  Inpatient Medications Scheduled Meds: . Chlorhexidine Gluconate Cloth  6 each Topical Daily  . docusate sodium  200 mg Oral BID  . heparin  5,000 Units Subcutaneous Q8H  . hydrALAZINE  25 mg Oral TID  . levothyroxine  88 mcg Oral Q0600  . metoprolol tartrate  50 mg Oral BID  . pantoprazole  40 mg Oral Daily  . polyethylene glycol  17 g Oral BID  . sodium bicarbonate  650 mg Oral BID   Continuous Infusions: . sodium chloride 100 mL/hr at 03/19/20 1348  . cefTRIAXone (ROCEPHIN)  IV 1 g (03/19/20 0013)   PRN Meds:.acetaminophen **OR** [DISCONTINUED] acetaminophen, albuterol, [DISCONTINUED] ondansetron **OR** ondansetron (ZOFRAN) IV  Antibiotics  :   Anti-infectives (From admission, onward)   Start     Dose/Rate Route Frequency Ordered Stop   03/19/20 0000  cefTRIAXone (ROCEPHIN) 1 g in sodium chloride 0.9 % 100 mL IVPB     Discontinue    Note to Pharmacy: 1st dose now   1 g 200 mL/hr over 30 Minutes Intravenous Every 24 hours 03/18/20 0127     03/18/20 0145  cefTRIAXone (ROCEPHIN) 1 g in sodium chloride 0.9 % 100 mL IVPB       Note to Pharmacy: 1st dose now   1 g 200 mL/hr over 30 Minutes Intravenous  Once 03/18/20 0135 03/18/20 0252          Objective:   Vitals:   03/19/20 0800 03/19/20 1111 03/19/20 1148 03/19/20 1200  BP:  (!) 163/72  Pulse: 66 63    Resp: 20 20  18   Temp: 98.8 F (37.1 C) 98.8 F (37.1 C) 97.8 F (36.6 C) 97.8 F (36.6 C)  TempSrc: Oral Oral Oral   SpO2:  98%    Weight:      Height:        SpO2: 98 %  Wt Readings from Last 3 Encounters:  03/18/20 66.3 kg  03/01/20 61.7 kg  09/02/19 60.3 kg    No intake or output data in  the 24 hours ending 03/19/20 1511   Physical Exam  Awake Alert, No new F.N deficits, Normal affect [place].AT,PERRAL, dry oral mucosa Supple Neck,No JVD, No cervical lymphadenopathy appriciated.  Symmetrical Chest wall movement, Good air movement bilaterally, CTAB RRR,No Gallops, Rubs or new Murmurs, No Parasternal Heave +ve B.Sounds, Abd Soft, No tenderness, No organomegaly appriciated, No rebound - guarding or rigidity. No Cyanosis, Clubbing or edema, No new Rash or bruise    Data Review:    Recent Labs  Lab 03/17/20 1821 03/18/20 0116 03/18/20 0605 03/19/20 0256  WBC 12.5* 12.4* 12.0* 11.8*  HGB 9.5* 8.8* 8.4* 8.4*  HCT 26.8* 24.4* 23.5* 23.6*  PLT 429* 358 292 331  MCV 75.7* 75.5* 75.6* 75.9*  MCH 26.8 27.2 27.0 27.0  MCHC 35.4 36.1* 35.7 35.6  RDW 13.1 13.2 13.1 13.2  LYMPHSABS  --   --   --  0.8  MONOABS  --   --   --  0.6  EOSABS  --   --   --  0.1  BASOSABS  --   --   --  0.0    Recent Labs  Lab 03/17/20 1821 03/17/20 1821 03/17/20 2043 03/17/20 2043 03/18/20 0116 03/18/20 0605 03/18/20 0745 03/18/20 0748 03/18/20 0954 03/18/20 0954 03/18/20 1644 03/18/20 2048 03/19/20 0256 03/19/20 0806 03/19/20 1207  NA 109*   < > 112*   < > 112*   < >  --   --  113*   < > 112* 113* 114* 117* 117*  K 3.5   < > 3.4*   < > 3.3*   < >  --   --  4.4  --  3.3* 3.5 4.2  --  4.2  CL 68*   < > 73*   < > 74*   < >  --   --  74*  --  75* 76* 77*  --  81*  CO2 21*   < > 20*   < > 21*   < >  --   --  18*  --  19* 18* 18*  --  17*  GLUCOSE 102*   < > 95   < > 89   < >  --   --  87  --  125* 111* 91  --  98  BUN 118*   < > 116*   < > 117*   < >  --   --  121*  --  121* 123* 121*  --  120*  CREATININE 7.12*   < > 7.25*   < > 7.59*   < >  --   --  7.81*  --  7.87* 8.10* 8.50*  --  8.76*  CALCIUM 8.7*   < > 8.1*   < > 8.4*   < >  --   --  8.6*  --  8.2* 8.4* 8.4*  --  8.3*  AST 19  --   --   --   --   --   --   --   --   --   --   --  18  --   --   ALT 13  --   --   --   --   --    --   --   --   --   --   --  11  --   --   ALKPHOS 71  --   --   --   --   --   --   --   --   --   --   --  60  --   --   BILITOT 0.7  --   --   --   --   --   --   --   --   --   --   --  0.5  --   --   ALBUMIN 3.2*  --   --   --   --   --   --   --  2.7*  --  2.4*  --  2.6*  --   --   MG  --   --   --   --   --   --   --   --   --   --   --   --  2.8*  --   --   CRP  --   --   --   --   --   --  9.0*  --   --   --   --   --  11.4*  --   --   DDIMER  --   --   --   --   --   --   --   --  3.07*  --   --   --  3.30*  --   --   PROCALCITON  --   --   --   --   --   --  0.17  --   --   --   --   --  0.22  --   --   TSH  --   --  1.209  --  1.093  --   --   --   --   --   --   --   --   --   --   BNP  --   --   --   --   --   --   --  530.8*  --   --   --   --  1,430.6*  --   --    < > = values in this interval not displayed.    Recent Labs  Lab 03/17/20 1918 03/18/20 0745 03/18/20 0748 03/18/20 0954 03/19/20 0256  CRP  --  9.0*  --   --  11.4*  DDIMER  --   --   --  3.07* 3.30*  BNP  --   --  530.8*  --  1,430.6*  PROCALCITON  --  0.17  --   --  0.22  SARSCOV2NAA NEGATIVE  --   --   --   --     ------------------------------------------------------------------------------------------------------------------ No results for input(s): CHOL, HDL, LDLCALC, TRIG, CHOLHDL, LDLDIRECT in the last 72 hours.  No results found for: HGBA1C ------------------------------------------------------------------------------------------------------------------ Recent Labs    03/18/20 0116  TSH 1.093   ------------------------------------------------------------------------------------------------------------------ Recent Labs    03/18/20 0954 03/19/20 0806  VITAMINB12 2,209* 2,263*  FOLATE 60.0 55.5  FERRITIN 634* 565*  TIBC 256 235*  IRON 87 98  RETICCTPCT 1.0 0.9    Coagulation profile No results for input(s): INR, PROTIME in the last 168 hours.  Recent Labs    03/18/20 0954  03/19/20 0256  DDIMER 3.07* 3.30*    Cardiac Enzymes No results for input(s): CKMB, TROPONINI, MYOGLOBIN in the last 168 hours.  Invalid input(s): CK ------------------------------------------------------------------------------------------------------------------    Component Value Date/Time   BNP 1,430.6 (H) 03/19/2020 0256    Micro Results Recent Results (from the past 240 hour(s))  SARS Coronavirus 2 by RT PCR (hospital order, performed in [facility] hospital lab) Nasopharyngeal Nasopharyngeal Swab     Status: None   Collection Time: 03/17/20  7:18 PM   Specimen: Nasopharyngeal Swab  Result Value Ref Range Status   SARS Coronavirus 2 NEGATIVE NEGATIVE Final    Comment: (NOTE) SARS-CoV-2 target nucleic acids are NOT DETECTED.  The SARS-CoV-2 RNA is generally detectable in upper and lower respiratory specimens during the acute phase of infection. The lowest concentration of SARS-CoV-2 viral copies this assay can detect is 250 copies / mL. A negative result does not preclude SARS-CoV-2 infection and should not be used as the sole basis for treatment or other patient management decisions.  A negative result may occur with improper specimen collection / handling, submission of specimen other than nasopharyngeal swab, presence of viral mutation(s) within the areas targeted by this assay, and inadequate number of viral copies (<250 copies / mL). A negative result must be combined with clinical observations, patient history, and epidemiological information.  Fact Sheet for Patients:   StrictlyIdeas.no  Fact Sheet for Healthcare Providers: BankingDealers.co.za  This test is not yet approved or  cleared by the [place] FDA and has been authorized for detection and/or diagnosis of SARS-CoV-2 by FDA under an Emergency Use Authorization (EUA).  This EUA will remain in effect (meaning this test can be used) for the duration of  the COVID-19 declaration under Section 564(b)(1) of the Act, 21 U.S.C. section 360bbb-3(b)(1), unless the authorization is terminated or revoked sooner.  Performed at [facility], [address] [place], [place] [address]   MRSA PCR Screening     Status: None   Collection Time: 03/17/20 11:47 PM   Specimen: Nasal Mucosa; Nasopharyngeal  Result Value Ref Range Status   MRSA by PCR NEGATIVE NEGATIVE Final    Comment:        The GeneXpert MRSA Assay (FDA approved for NASAL specimens only), is one component of a comprehensive MRSA colonization surveillance program. It is not intended to diagnose MRSA infection nor to guide or monitor treatment for MRSA infections. Performed at [facility], [place] [address], [place], [place] [address]     Radiology Reports CT Abdomen Pelvis Wo Contrast  Result Date: 03/17/2020 CLINICAL DATA:  Vomiting for 1 week, low sella diam, elevated BUN and creatinine EXAM: CT ABDOMEN AND PELVIS WITHOUT CONTRAST TECHNIQUE: Multidetector CT imaging of the abdomen and pelvis was performed following the standard protocol without IV contrast. COMPARISON:  Chest radiograph 12/26/2014, lumbar radiograph 08/13/2012, MR lumbar spine 06/16/2012 FINDINGS: Lower chest: Scarring and reticular changes noted in the lung bases, left greater than right. No consolidative opacity or pleural effusion. Normal heart size. No pericardial effusion. Tiny benign appearing macrocalcification versus fiducial in the right breast. Hepatobiliary: No visible focal liver lesions. Smooth liver surface contour. Normal hepatic attenuation. Gallbladder is moderately distended. Some focal thickening is noted towards the gallbladder fundus, possible adenomyomatosis. A partially calcified gallstone noted layering dependently as well. No  pericholecystic fluid or inflammation. Normal caliber biliary tree without visible calcified intraductal gallstones. Pancreas: Moderate pancreatic atrophy.  No inflammation or ductal dilatation. No discernible pancreatic lesions. Spleen: Normal in size without focal abnormality. Adrenals/Urinary Tract: No concerning adrenal lesions. No visible or contour deforming renal lesions. No urolithiasis or hydronephrosis. Urinary bladder is moderately distended with layering air-fluid level. No significant pericholecystic inflammation or urinary bladder wall thickening. Stomach/Bowel: Question some mild thickening versus underdistention of the stomach. Duodenum takes a normal course across the midline abdomen. No small bowel thickening or dilatation. Large volume of inspissated fecal material noted throughout the colon. Question some mild distal colonic thickening versus underdistention of the sigmoid. No adjacent inflammation, free fluid or evidence of perforation. No high-grade bowel obstruction. Vascular/Lymphatic: Atherosclerotic calcifications throughout the abdominal aorta and branch vessels. No aneurysm or ectasia. No enlarged abdominopelvic lymph nodes. Reproductive: Uterus is surgically absent. No concerning adnexal lesions. Other: No abdominopelvic free fluid or free gas. No bowel containing hernias. Small fat containing umbilical hernia. Musculoskeletal: Prior L3-L5 posterior spinal fusion. No evidence of hardware failure or acute complication is seen. Mild anterior wedging and Schmorl's node at the L1 superior endplate is similar to comparison radiographs. Diffuse discogenic and facet degenerative changes noted throughout the lumbar spine at both instrumented in non instrumented levels. Additional degenerative changes in the hips and pelvis. The osseous structures appear diffusely demineralized which may limit detection of small or nondisplaced fractures. No acute or worrisome osseous lesions. IMPRESSION: 1. Large volume of inspissated fecal material noted throughout the colon. Mild distal colonic thickening versus underdistention of the sigmoid without evidence of  inflammation. Correlate for features of constipation. Furthermore, could consider correlation with most recent colonoscopy or outpatient visualization if not recently performed. 2. Similar thickening versus underdistention of the stomach, could reflect gastritis or secondary change given patient's prolonged emesis. 3. Moderately distended urinary bladder with layering air-fluid level. Could reflect recent instrumentation/catheterization, however if urinary symptoms are present, recommend further evaluation with urinalysis to exclude cystitis. 4. Cholelithiasis without evidence of acute cholecystitis. Mild focal thickening at the gallbladder fundus is suspected to reflect adenomyomatosis but could correlate with outpatient right upper quadrant ultrasound. Could perform on acute basis if biliary symptoms are present. 5. Aortic Atherosclerosis (ICD10-I70.0). Electronically Signed   By: Lovena Le M.D.   On: 03/17/2020 19:37   X-ray chest PA and lateral  Result Date: 03/18/2020 CLINICAL DATA:  Acute renal insufficiency EXAM: CHEST - 2 VIEW COMPARISON:  December 26, 2014 FINDINGS: No pneumothorax. The cardiomediastinal silhouette is normal. No pulmonary nodules or masses. Mild bibasilar opacities, likely atelectasis. No overt edema. IMPRESSION: Probable mild bibasilar atelectasis. Recommend follow-up to resolution. No other acute abnormalities. Electronically Signed   By: Dorise Bullion III M.D   On: 03/18/2020 08:56   DG Chest Port 1 View  Result Date: 03/19/2020 CLINICAL DATA:  Shortness of breath. EXAM: PORTABLE CHEST 1 VIEW COMPARISON:  03/18/2020 FINDINGS: The cardiac silhouette, mediastinal and hilar contours are within normal limits and stable. Stable underlying emphysematous changes and pulmonary scarring. Suspect streaky bibasilar infiltrates. No pleural effusions. No worrisome pulmonary lesions. IMPRESSION: 1. Underlying emphysematous changes and pulmonary scarring. 2. Suspect streaky bibasilar  infiltrates. Electronically Signed   By: Marijo Sanes M.D.   On: 03/19/2020 07:59   US Abdomen Limited RUQ  Result Date: 03/18/2020 CLINICAL DATA:  Nausea and vomiting EXAM: ULTRASOUND ABDOMEN LIMITED RIGHT UPPER QUADRANT COMPARISON:  CT abdomen and pelvis 03/17/2020 FINDINGS: Gallbladder: Large shadowing calculus within gallbladder 19 mm diameter.  No gallbladder wall thickening, pericholecystic fluid or sonographic Murphy sign. Common bile duct: Diameter: 3 mm, normal Liver: Normal appearance. No mass or nodularity. Portal vein is patent on color Doppler imaging with normal direction of blood flow towards the liver. Other: Trace perihepatic free fluid. IMPRESSION: Cholelithiasis without evidence of cholecystitis. Trace ascites adjacent to liver. Electronically Signed   By: Lavonia Dana M.D.   On: 03/18/2020 13:32    Time Spent in minutes  30   Lala Lund M.D on 03/19/2020 at 3:11 PM  To page go to [email or web address] - password [email or web address]

## 2020-03-19 NOTE — Progress Notes (Signed)
Patient arrived to the unit via stretcher from [employer].  Verified patient ID. Patient is alert and oriented.  IV intact to the left antecubital with normal saline infusing @50mlhr .  Foley Catheter clean and intact.  No skin issues noted. Educated the patient on how to reach the staff on the unit and importance of calling prior to getting out of bed.  Activated the bed alarm, lowered the bed and placed the call light within reach.  Notified  [employer] admissions of patient arrival.  Will continue to monitor the patient

## 2020-03-19 NOTE — Progress Notes (Addendum)
**   Semaya from the lab called and advised Sodium level 117 Relaying message to providers and will wait on any additional orders.   * Urine culture collected and sending to Lab.    ** Patient is up with physical therapy to the bathroom at this time for bowel movement.

## 2020-03-19 NOTE — Progress Notes (Signed)
Patient is resting comfortably at this time and in no acute distress. Patient denies any nausea or vomiting at this time. Patient was given her PO medications per orders with a small drink of water to get them down.  Patient went back to sleep and is snoring at this time.

## 2020-03-19 NOTE — Progress Notes (Signed)
Provider: Dr. Candiss Norse  Noted order   Plz start LR at 75cc for 12hrs   LR has been started at 75 cc/hr for 12 hrs.

## 2020-03-19 NOTE — Progress Notes (Signed)
Patient back to bed.   Patient did start to vomit and have dry heaves.  Orders to stop LR and start normal saline at 100 mL/hr.  Orders completed.  Patient resting at this time.

## 2020-03-19 NOTE — Progress Notes (Signed)
Per Dr. Candiss Norse: stop IVF will resume in 2hrsi  will order it, for now DC IVF BMP at 12 pm, plz txt me when back, thx   I advised will make sure he knows when BMP results are back.

## 2020-03-19 NOTE — Progress Notes (Addendum)
[facility] Progress Note  Name: Jasmine White MRN: [id number] DOB: 03-19-1946  Chief Complaint:  n/v  Subjective:  Unfortunately some difficulty with obtaining labs per checkout.  Na still critically low.  Had 500 mL UOP charted over 6/27.  Charted as being on NS at 50 ml/hr.  She confirms would want a biopsy if needed or HD if needed.  We discussed prioritizing sodium first  Review of systems:  Reports n/v - can't tolerate PO  Has been officially NPO after midnight per order as well  Denies shortness of breath and denies chest pain  States feels weak   -------------  Background on consult:  HPI: Pt is a 31F with a PMH sig for HTN, HLD, hypothyroidism, COVID in November who is now seen in consultation at the request of Dr. Candiss Norse for eval and recs re: AKI, hyponatremia.  Pt has had COVID in November and since then hasn't felt right.  Having burning every night in her legs for which she was taking Advil nightly.  Takes hyzaar for HTN.  Previously Cr was 0.78 in Dec 2020.  Went to PCP 03/01/20 and was 2.34 then.  Has had one week of n/v.  Has still been taking her Advil and her hyzaar.  Went to [facility] last night where she was found to have Cr of > 7, BUN 119, and severe hyponatremia at 109. CT scan showed large amount of inspissated stool and possibly thickened sigmoid colon and stomach/ GB.  Was given 1L NS, started on 50 mL/ hr NS, and transferred to [facility] for admission.  Pt states she's very thirsty right now.  No dizziness/ falls/ seizure like activity.  She has been hypertensive here.  Urine lytes show isosthenuria with urine Na 24.  (? If before or after IV NS bolus).  Na 112 this AM, no real movement in BUN/ Cr.  No HA, f/c, SOB, rashes, nosebleeds, red/ itchy eyes, nasal crusting, coughing up blood, blood in urine/ stool.  Says some foamy urine.    Intake/Output Summary (Last 24 hours) at 03/19/2020 0650 Last data filed at 03/18/2020 1400 Gross per 24 hour  Intake  484.68 ml  Output 500 ml  Net -15.32 ml    Vitals:  Vitals:   03/18/20 1933 03/18/20 2000 03/19/20 0000 03/19/20 0417  BP:  (!) 155/69 (!) 150/66 (!) 155/65  Pulse:  64 68 77  Resp:  18 (!) 23 (!) 22  Temp: 98.9 F (37.2 C) 98.9 F (37.2 C) 97.7 F (36.5 C) 98.7 F (37.1 C)  TempSrc:   Oral Oral  SpO2:  97% 96% 99%  Weight:      Height:         Physical Exam:  General adult female in bed in no acute distress HEENT normocephalic atraumatic extraocular movements intact sclera anicteric Neck supple trachea midline Lungs clear to auscultation bilaterally normal work of breathing at rest  Heart S1S2 no rub Abdomen soft nontender nondistended Extremities no pitting edema  appreciated Psych normal mood and affect Neuro alert and oriented x 3 provides hx and follows commands  GU - foley catheter in place with urine  Medications reviewed   Labs:  BMP Latest Ref Rng & Units 03/19/2020 03/18/2020 03/18/2020  Glucose 70 - 99 mg/dL 91 111(H) 125(H)  BUN 8 - 23 mg/dL 121(H) 123(H) 121(H)  Creatinine 0.44 - 1.00 mg/dL 8.50(H) 8.10(H) 7.87(H)  BUN/Creat Ratio 12 - 28 - - -  Sodium 135 - 145 mmol/L 114(LL)  113(LL) 112(LL)  Potassium 3.5 - 5.1 mmol/L 4.2 3.5 3.3(L)  Chloride 98 - 111 mmol/L 77(L) 76(L) 75(L)  CO2 22 - 32 mmol/L 18(L) 18(L) 19(L)  Calcium 8.9 - 10.3 mg/dL 8.4(L) 8.4(L) 8.2(L)     Assessment/Plan:   # Severe hypovolemic hyponatremia: in setting of n/v and HCTZ use as well as profound AKI.  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  Cautious correction to avoid adverse outcome.   - Keep NPO for now (for sodium, not for HD for now) - Check sodium every 4 hours  - 250 mL minibolus now - Increase NS to 100 ml/hr   - hypothyroidism per primary team    # AKI: baseline Cr 0.78.  Had Cr of 2.3 2 weeks ago and now is > 7.0 in the setting of 1 week of n/v, Advil, and Hyzaar.  Suspect at least some ATN component.  UA + blood and protein with WBCs too,  many bactereria.  No emergent indications for dialysis but need to follow closely, fortunately not uremic.  Work-up has included: Hep C nonreactive, hep B negative, HIV nonreactive.  CT with mod distended urinary bladder, no hydro  - anti-GBM pending, ANCA pending, complement pending, ASO pending, ANA pending, free light chains pending   - Still needs up/cr ratio collected - Discussed with pt the possibility of needing a biopsy and/ or dialysis and she is willing to pursue both if determined to be necessary - Continue foley catheter for now  - hope to avoid HD until Na is safely improved   # Proteinuria  - with concurrent hematuria - work-up as above is requested - needs up/cr ratio sent   # Constipation, severe: please avoid all Mg containing laxatives/ enemas and fleets enemas.  Per primary   # HTN: holding her home hyzaar with AKI  # Hyperphosphatemia - renal diet when taking PO and added renvela for same  # Possible UTI- ordered urine culture - not able to locate one; on ceftriaxone    # Abnormal CT scan- RUQ [place] with cholelithiasis without evidence of cholecystitis   # Microcytic anemia  - Primary has ordered iron studies, B12, and folate levels   # Dispo: continue inpatient monitoring   Claudia Desanctis, MD 03/19/2020 7:41 AM

## 2020-03-19 NOTE — Progress Notes (Signed)
CRITICAL VALUE ALERT  Critical Value: Sodium 114   Date & Time Notied:  03/19/20     Provider Notified: Reece Levy, MD  Orders Received/Actions taken: awaiting new orders

## 2020-03-19 NOTE — Progress Notes (Signed)
Lab called and patient sodium is 116 at this time. I advised Dr. Candiss Norse of sodium

## 2020-03-19 NOTE — Progress Notes (Signed)
CRITICAL VALUE ALERT  Critical Value:  Sodium 117  Date & Time Notied:  03/19/20 @ 1300  Provider Notified: Dr Candiss Norse notified  Orders Received/Actions taken: will await any new orders

## 2020-03-20 ENCOUNTER — Inpatient Hospital Stay (HOSPITAL_COMMUNITY): Payer: Medicare PPO

## 2020-03-20 HISTORY — PX: IR US GUIDE VASC ACCESS RIGHT: IMG2390

## 2020-03-20 HISTORY — PX: IR FLUORO GUIDE CV LINE RIGHT: IMG2283

## 2020-03-20 LAB — CBC WITH DIFFERENTIAL/PLATELET
Abs Immature Granulocytes: 0.08 10*3/uL — ABNORMAL HIGH (ref 0.00–0.07)
Basophils Absolute: 0 10*3/uL (ref 0.0–0.1)
Basophils Relative: 0 %
Eosinophils Absolute: 0 10*3/uL (ref 0.0–0.5)
Eosinophils Relative: 0 %
HCT: 23.2 % — ABNORMAL LOW (ref 36.0–46.0)
Hemoglobin: 8.1 g/dL — ABNORMAL LOW (ref 12.0–15.0)
Immature Granulocytes: 1 %
Lymphocytes Relative: 4 %
Lymphs Abs: 0.5 10*3/uL — ABNORMAL LOW (ref 0.7–4.0)
MCH: 26.8 pg (ref 26.0–34.0)
MCHC: 34.9 g/dL (ref 30.0–36.0)
MCV: 76.8 fL — ABNORMAL LOW (ref 80.0–100.0)
Monocytes Absolute: 0.4 10*3/uL (ref 0.1–1.0)
Monocytes Relative: 3 %
Neutro Abs: 10.5 10*3/uL — ABNORMAL HIGH (ref 1.7–7.7)
Neutrophils Relative %: 92 %
Platelets: 251 10*3/uL (ref 150–400)
RBC: 3.02 MIL/uL — ABNORMAL LOW (ref 3.87–5.11)
RDW: 13.3 % (ref 11.5–15.5)
WBC: 11.5 10*3/uL — ABNORMAL HIGH (ref 4.0–10.5)
nRBC: 0 % (ref 0.0–0.2)

## 2020-03-20 LAB — ANA W/REFLEX IF POSITIVE: Anti Nuclear Antibody (ANA): POSITIVE — AB

## 2020-03-20 LAB — BASIC METABOLIC PANEL
Anion gap: 22 — ABNORMAL HIGH (ref 5–15)
Anion gap: 20 — ABNORMAL HIGH (ref 5–15)
BUN: 120 mg/dL — ABNORMAL HIGH (ref 8–23)
BUN: 123 mg/dL — ABNORMAL HIGH (ref 8–23)
CO2: 14 mmol/L — ABNORMAL LOW (ref 22–32)
CO2: 16 mmol/L — ABNORMAL LOW (ref 22–32)
Calcium: 8.1 mg/dL — ABNORMAL LOW (ref 8.9–10.3)
Calcium: 8.1 mg/dL — ABNORMAL LOW (ref 8.9–10.3)
Chloride: 81 mmol/L — ABNORMAL LOW (ref 98–111)
Chloride: 85 mmol/L — ABNORMAL LOW (ref 98–111)
Creatinine, Ser: 8.93 mg/dL — ABNORMAL HIGH (ref 0.44–1.00)
Creatinine, Ser: 9.38 mg/dL — ABNORMAL HIGH (ref 0.44–1.00)
GFR calc Af Amer: 5 mL/min — ABNORMAL LOW (ref >60)
GFR calc Af Amer: 4 mL/min — ABNORMAL LOW (ref >60)
GFR calc non Af Amer: 4 mL/min — ABNORMAL LOW (ref >60)
GFR calc non Af Amer: 4 mL/min — ABNORMAL LOW (ref >60)
Glucose, Bld: 82 mg/dL (ref 70–99)
Glucose, Bld: 100 mg/dL — ABNORMAL HIGH (ref 70–99)
Potassium: 3.7 mmol/L (ref 3.5–5.1)
Potassium: 4 mmol/L (ref 3.5–5.1)
Sodium: 117 mmol/L — CL (ref 135–145)
Sodium: 121 mmol/L — ABNORMAL LOW (ref 135–145)

## 2020-03-20 LAB — SODIUM
Sodium: 118 mmol/L — CL (ref 135–145)
Sodium: 119 mmol/L — CL (ref 135–145)

## 2020-03-20 LAB — COMPREHENSIVE METABOLIC PANEL
ALT: 13 U/L (ref 0–44)
AST: 20 U/L (ref 15–41)
Albumin: 2.4 g/dL — ABNORMAL LOW (ref 3.5–5.0)
Alkaline Phosphatase: 62 U/L (ref 38–126)
Anion gap: 20 — ABNORMAL HIGH (ref 5–15)
BUN: 121 mg/dL — ABNORMAL HIGH (ref 8–23)
CO2: 15 mmol/L — ABNORMAL LOW (ref 22–32)
Calcium: 8.2 mg/dL — ABNORMAL LOW (ref 8.9–10.3)
Chloride: 83 mmol/L — ABNORMAL LOW (ref 98–111)
Creatinine, Ser: 8.93 mg/dL — ABNORMAL HIGH (ref 0.44–1.00)
GFR calc Af Amer: 5 mL/min — ABNORMAL LOW (ref >60)
GFR calc non Af Amer: 4 mL/min — ABNORMAL LOW (ref >60)
Glucose, Bld: 90 mg/dL (ref 70–99)
Potassium: 3.6 mmol/L (ref 3.5–5.1)
Sodium: 118 mmol/L — CL (ref 135–145)
Total Bilirubin: 0.4 mg/dL (ref 0.3–1.2)
Total Protein: 6.4 g/dL — ABNORMAL LOW (ref 6.5–8.1)

## 2020-03-20 LAB — PROTEIN / CREATININE RATIO, URINE
Creatinine, Urine: 40.13 mg/dL
Protein Creatinine Ratio: 7.43 mg/mg{Cre} — ABNORMAL HIGH (ref 0.00–0.15)
Total Protein, Urine: 298 mg/dL

## 2020-03-20 LAB — D-DIMER, QUANTITATIVE: D-Dimer, Quant: 3.65 ug/mL-FEU — ABNORMAL HIGH (ref 0.00–0.50)

## 2020-03-20 LAB — ENA+DNA/DS+ANTICH+CENTRO+JO...
Anti JO-1: <0.2 AI (ref 0.0–0.9)
Centromere Ab Screen: <0.2 AI (ref 0.0–0.9)
Chromatin Ab SerPl-aCnc: 0.3 AI (ref 0.0–0.9)
ENA SM Ab Ser-aCnc: <0.2 AI (ref 0.0–0.9)
Ribonucleic Protein: <0.2 AI (ref 0.0–0.9)
SSA (Ro) (ENA) Antibody, IgG: <0.2 AI (ref 0.0–0.9)
SSB (La) (ENA) Antibody, IgG: <0.2 AI (ref 0.0–0.9)
Scleroderma (Scl-70) (ENA) Antibody, IgG: <0.2 AI (ref 0.0–0.9)
ds DNA Ab: 50 IU/mL — ABNORMAL HIGH (ref 0–9)

## 2020-03-20 LAB — URINE CULTURE: Culture: NO GROWTH

## 2020-03-20 LAB — PROCALCITONIN: Procalcitonin: 0.29 ng/mL

## 2020-03-20 LAB — C-REACTIVE PROTEIN: CRP: 12.3 mg/dL — ABNORMAL HIGH (ref <1.0)

## 2020-03-20 LAB — BRAIN NATRIURETIC PEPTIDE: B Natriuretic Peptide: 2535.5 pg/mL — ABNORMAL HIGH (ref 0.0–100.0)

## 2020-03-20 LAB — GLUCOSE, CAPILLARY: Glucose-Capillary: 109 mg/dL — ABNORMAL HIGH (ref 70–99)

## 2020-03-20 LAB — MAGNESIUM: Magnesium: 2.7 mg/dL — ABNORMAL HIGH (ref 1.7–2.4)

## 2020-03-20 MED ORDER — HEPARIN SODIUM (PORCINE) 1000 UNIT/ML IJ SOLN
INTRAMUSCULAR | Status: AC
  Filled 2020-03-20: qty 4

## 2020-03-20 MED ORDER — CHLORHEXIDINE GLUCONATE CLOTH 2 % EX PADS: 6.0000 | MEDICATED_PAD | Freq: Every day | CUTANEOUS | Status: DC

## 2020-03-20 MED ORDER — FENTANYL CITRATE (PF) 100 MCG/2ML IJ SOLN
INTRAMUSCULAR | Status: AC | PRN
  Administered 2020-03-20: 12.5 ug via INTRAVENOUS

## 2020-03-20 MED ORDER — SODIUM CHLORIDE 3 % IV SOLN
INTRAVENOUS | Status: AC
  Filled 2020-03-20: qty 500

## 2020-03-20 MED ORDER — CEFAZOLIN SODIUM-DEXTROSE 2-4 GM/100ML-% IV SOLN
INTRAVENOUS | Status: AC
  Filled 2020-03-20: qty 100

## 2020-03-20 MED ORDER — HEPARIN SODIUM (PORCINE) 1000 UNIT/ML IJ SOLN
INTRAMUSCULAR | Status: AC
  Filled 2020-03-20: qty 1

## 2020-03-20 MED ORDER — FENTANYL CITRATE (PF) 100 MCG/2ML IJ SOLN
INTRAMUSCULAR | Status: AC
  Filled 2020-03-20: qty 2

## 2020-03-20 MED ORDER — METHOCARBAMOL 1000 MG/10ML IJ SOLN
500.0000 mg | Freq: Three times a day (TID) | INTRAVENOUS | Status: AC | PRN
  Administered 2020-03-20: 500 mg via INTRAVENOUS
  Filled 2020-03-20 (×2): qty 5

## 2020-03-20 MED ORDER — HEPARIN SODIUM (PORCINE) 1000 UNIT/ML IJ SOLN
INTRAMUSCULAR | Status: AC | PRN
  Administered 2020-03-20: 3200 [IU] via INTRAVENOUS

## 2020-03-20 MED ORDER — LIDOCAINE HCL 1 % IJ SOLN
INTRAMUSCULAR | Status: AC
  Filled 2020-03-20: qty 20

## 2020-03-20 MED ORDER — LABETALOL HCL 5 MG/ML IV SOLN
10.0000 mg | Freq: Once | INTRAVENOUS | Status: AC
  Administered 2020-03-20: 10 mg via INTRAVENOUS
  Filled 2020-03-20: qty 4

## 2020-03-20 MED ORDER — AMLODIPINE BESYLATE 10 MG PO TABS
10.0000 mg | ORAL_TABLET | Freq: Every day | ORAL | Status: DC
  Administered 2020-03-20 – 2020-04-05 (×13): 10 mg via ORAL
  Filled 2020-03-20 (×14): qty 1

## 2020-03-20 MED ORDER — MIDAZOLAM HCL 2 MG/2ML IJ SOLN
INTRAMUSCULAR | Status: AC
  Filled 2020-03-20: qty 2

## 2020-03-20 MED ORDER — MIDAZOLAM HCL 2 MG/2ML IJ SOLN
INTRAMUSCULAR | Status: AC | PRN
  Administered 2020-03-20: 0.5 mg via INTRAVENOUS

## 2020-03-20 MED ORDER — HYDRALAZINE HCL 50 MG PO TABS
50.0000 mg | ORAL_TABLET | Freq: Three times a day (TID) | ORAL | Status: DC
  Administered 2020-03-20 – 2020-03-23 (×10): 50 mg via ORAL
  Filled 2020-03-20 (×11): qty 1

## 2020-03-20 MED ORDER — CEFAZOLIN SODIUM-DEXTROSE 2-4 GM/100ML-% IV SOLN
2.0000 g | INTRAVENOUS | Status: AC
  Administered 2020-03-20: 2 g via INTRAVENOUS

## 2020-03-20 MED ORDER — LIDOCAINE HCL 1 % IJ SOLN
INTRAMUSCULAR | Status: AC | PRN
  Administered 2020-03-20: 5 mL

## 2020-03-20 MED ORDER — SODIUM CHLORIDE 0.9 % IV SOLN
1000.0000 mg | INTRAVENOUS | Status: AC
  Administered 2020-03-20 – 2020-03-22 (×3): 1000 mg via INTRAVENOUS
  Filled 2020-03-20 (×4): qty 8

## 2020-03-20 MED ORDER — GELATIN ABSORBABLE 12-7 MM EX MISC
CUTANEOUS | Status: AC
  Filled 2020-03-20: qty 1

## 2020-03-20 MED ORDER — CHLORHEXIDINE GLUCONATE CLOTH 2 % EX PADS
6.0000 | MEDICATED_PAD | Freq: Every day | CUTANEOUS | Status: DC
  Administered 2020-03-21 – 2020-03-24 (×3): 6 via TOPICAL

## 2020-03-20 NOTE — Progress Notes (Signed)
CRITICAL VALUE ALERT  Critical Value:  Sodium 117  Date & Time Notied:  03/20/20 at [place]  Provider Notified: M. Sharlet Salina  Orders Received/Actions taken: no new order receive.

## 2020-03-20 NOTE — Progress Notes (Signed)
CRITICAL VALUE ALERT  Critical Value:  Na 119  Date & Time Notied:  03/20/2020; 10:54  Provider Notified: Dr. Harrie Jeans  Orders Received/Actions taken:

## 2020-03-20 NOTE — Progress Notes (Signed)
Patient back from IR. Alert and oriented x 4, no acute distress noted, no complaints. Right IJ HD catheter in place; VS stable; family at bedside. Will cotinue to monitor.

## 2020-03-20 NOTE — Progress Notes (Addendum)
CRITICAL VALUE ALERT  Critical Value:  Na 118  Date & Time Notied:  03/20/2020; 08:28  Provider Notified: Dr. Candiss Norse; Harrie Jeans  Orders Received/Actions taken:

## 2020-03-20 NOTE — Progress Notes (Signed)
Patient was shaking her Bl hand but she was alert and oriented. Her vital signs was temp 98.3 oral, BP 182/76, pulse 72 , resp 24 and spo2 93 in room air. Notified on call provider M. Sharlet Salina and given Labetalol 10 mg iv.patient denies for pain. Recheck Blood pressure was 162/71. Will continue monitor.

## 2020-03-20 NOTE — Progress Notes (Signed)
IR notified to stop IV fluids

## 2020-03-20 NOTE — H&P (Signed)
Chief Complaint: Patient was seen in consultation today for  Chief Complaint  Patient presents with  . Emesis    Referring Physician(s): Dr. Royce Macadamia, Nephrology  Supervising Physician: Corrie Mckusick  Patient Status: [facility] - In-pt  History of Present Illness: Jasmine White is a 74 y.o. female with a medical history that includes chronic back pain, HTN, hypothyroidism, COVID (07/2019) and a recent diagnosis of CKD with a baseline creatinine of 0.78.  She presented to the ED 03/17/20 with nausea and vomiting. Lab work was significant for: sodium 109, chloride 68, BUN 118 and creatinine 7.12. Anion gap, 20. BUN/Cr levels two weeks ago (03/01/20) were 67 and 2.28.  She was admitted for severe hypovolemic hyponatremia in the setting of nausea/vomiting and HCTZ use and AKI.    Interventional Radiology has been asked to evaluate this patient for the placement of a tunneled catheter for hemodialysis and pheresis.   Past Medical History:  Diagnosis Date  . Arthritis   . Bruises easily   . Cancer ([place])    basal skin cancer  . Chronic back pain    HNP/stenosis and radiculopathy  . Hyperlipidemia    takes Pravastatin daily  . Hypertension    takes Hyzaar and toprol daily  . Hypothyroidism    takes Synthroid daily  . Insomnia    takes Elevil nightly  . Joint pain   . Joint swelling   . Low BP    past some sedation  . Nocturia   . PONV (postoperative nausea and vomiting)    with knee replacement b/p dropped  . Sinusitis    finished zpak yesterday  . Urinary frequency     Past Surgical History:  Procedure Laterality Date  . ABDOMINAL HYSTERECTOMY    . BACK SURGERY    . basal cell skin cancer     on face and forehead  . bil knee replacements    . BREAST BIOPSY     left breast/benign  . COLONOSCOPY    . LUMBAR LAMINECTOMY/DECOMPRESSION MICRODISCECTOMY  08/13/2012   Procedure: LUMBAR LAMINECTOMY/DECOMPRESSION MICRODISCECTOMY 1 LEVEL;  Surgeon: Erline Levine, MD;   Location: [facility];  Service: Neurosurgery;  Laterality: Left;  Left lumbar one-two Microdiskectomy    Allergies: Percocet [oxycodone-acetaminophen] and Ultram [tramadol]  Medications: Prior to Admission medications   Medication Sig Start Date End Date Taking? Authorizing Provider  amitriptyline (ELAVIL) 50 MG tablet Take 1 tablet (50 mg total) by mouth at bedtime. 09/02/19  Yes Martin, [name], FNP  Coenzyme Q10-Vitamin E (QUNOL ULTRA COQ10 PO) Take 1 capsule by mouth daily.   Yes [provider]  GARLIC PO Take 1 capsule by mouth daily.    Yes [provider]  HYDROcodone-acetaminophen (NORCO/VICODIN) 5-325 MG tablet Take 1 tablet by mouth every 6 (six) hours as needed for moderate pain. 03/01/20  Yes Martin, [name], FNP  losartan-hydrochlorothiazide (HYZAAR) 100-12.5 MG tablet Take 1 tablet by mouth daily. 03/01/20  Yes Martin, [name], FNP  LUTEIN PO Take 1 capsule by mouth daily.    Yes [provider]  metoprolol succinate (TOPROL-XL) 100 MG 24 hr tablet Take 1 tablet (100 mg total) by mouth daily. Take with or immediately following a meal. 03/01/20  Yes Hassell Done, [name], FNP  Multiple Vitamin (MULTIVITAMIN WITH MINERALS) TABS tablet Take 1 tablet by mouth daily.   Yes [provider]  Omega-3 Fatty Acids (FISH OIL PO) Take 1 capsule by mouth daily.    Yes [provider]  SYNTHROID 88 MCG tablet  TAKE 1 TABLET BY MOUTH ONCE DAILY BEFORE BREAKFAST Patient taking differently: Take 88 mcg by mouth daily before breakfast.  03/01/20  Yes Hassell Done [name], FNP     Family History  Problem Relation Age of Onset  . Hypertension Mother   . Heart disease Mother   . Stroke Father   . Heart disease Father   . Alcohol abuse Father   . Arthritis Brother   . Cancer Brother 75       prostate  . Arthritis Brother   . Colon cancer Neg Hx   . Colon polyps Neg Hx   . Stomach cancer Neg Hx   . Rectal cancer Neg Hx      Social History   Socioeconomic History  . Marital status: Married    Spouse name: Ray  . Number of children: 2  . Years of education: Not on file  . Highest education level: Not on file  Occupational History  . Occupation: retired  Tobacco Use  . Smoking status: Never Smoker  . Smokeless tobacco: Never Used  Vaping Use  . Vaping Use: Never used  Substance and Sexual Activity  . Alcohol use: No    Alcohol/week: 0.0 standard drinks  . Drug use: No  . Sexual activity: Not Currently  Other Topics Concern  . Not on file  Social History Narrative  . Not on file   Social Determinants of Health   Financial Resource Strain:   . Difficulty of Paying Living Expenses:   Food Insecurity:   . Worried About Charity fundraiser in the Last Year:   . Arboriculturist in the Last Year:   Transportation Needs:   . Film/video editor (Medical):   Marland Kitchen Lack of Transportation (Non-Medical):   Physical Activity:   . Days of Exercise per Week:   . Minutes of Exercise per Session:   Stress:   . Feeling of Stress :   Social Connections:   . Frequency of Communication with Friends and Family:   . Frequency of Social Gatherings with Friends and Family:   . Attends Religious Services:   . Active Member of Clubs or Organizations:   . Attends Archivist Meetings:   Marland Kitchen Marital Status:     Review of Systems: A 12 point ROS discussed and pertinent positives are indicated in the HPI above.  All other systems are negative.  Review of Systems  Constitutional: Positive for fatigue.  Respiratory: Positive for shortness of breath.   Cardiovascular: Negative for chest pain and leg swelling.  Gastrointestinal: Positive for nausea. Negative for abdominal distention, abdominal pain and diarrhea.  Musculoskeletal: Positive for back pain.  Neurological: Positive for headaches.    Vital Signs: BP (!) 171/80 (BP Location: Right Arm)   Pulse 74   Temp 98.7 F (37.1 C) (Oral)   Resp (!)  21   Ht 5\' 6"  (1.676 m)   Wt 146 lb 2.6 oz (66.3 kg)   SpO2 95%   BMI 23.59 kg/m   Physical Exam HENT:     Mouth/Throat:     Mouth: Mucous membranes are moist.  Cardiovascular:     Rate and Rhythm: Normal rate and regular rhythm.     Pulses: Normal pulses.     Heart sounds: Normal heart sounds.  Pulmonary:     Comments: Fine crackles throughout, patient is on O2 via nasal cannula.  Abdominal:     General: Bowel sounds are normal.     Palpations: Abdomen  is soft.  Skin:    General: Skin is warm and dry.  Neurological:     Mental Status: She is alert and oriented to person, place, and time.  Psychiatric:        Mood and Affect: Mood normal.        Behavior: Behavior normal.        Thought Content: Thought content normal.        Judgment: Judgment normal.     Imaging: CT Abdomen Pelvis Wo Contrast  Result Date: 03/17/2020 CLINICAL DATA:  Vomiting for 1 week, low sella diam, elevated BUN and creatinine EXAM: CT ABDOMEN AND PELVIS WITHOUT CONTRAST TECHNIQUE: Multidetector CT imaging of the abdomen and pelvis was performed following the standard protocol without IV contrast. COMPARISON:  Chest radiograph 12/26/2014, lumbar radiograph 08/13/2012, MR lumbar spine 06/16/2012 FINDINGS: Lower chest: Scarring and reticular changes noted in the lung bases, left greater than right. No consolidative opacity or pleural effusion. Normal heart size. No pericardial effusion. Tiny benign appearing macrocalcification versus fiducial in the right breast. Hepatobiliary: No visible focal liver lesions. Smooth liver surface contour. Normal hepatic attenuation. Gallbladder is moderately distended. Some focal thickening is noted towards the gallbladder fundus, possible adenomyomatosis. A partially calcified gallstone noted layering dependently as well. No pericholecystic fluid or inflammation. Normal caliber biliary tree without visible calcified intraductal gallstones. Pancreas: Moderate pancreatic atrophy.  No inflammation or ductal dilatation. No discernible pancreatic lesions. Spleen: Normal in size without focal abnormality. Adrenals/Urinary Tract: No concerning adrenal lesions. No visible or contour deforming renal lesions. No urolithiasis or hydronephrosis. Urinary bladder is moderately distended with layering air-fluid level. No significant pericholecystic inflammation or urinary bladder wall thickening. Stomach/Bowel: Question some mild thickening versus underdistention of the stomach. Duodenum takes a normal course across the midline abdomen. No small bowel thickening or dilatation. Large volume of inspissated fecal material noted throughout the colon. Question some mild distal colonic thickening versus underdistention of the sigmoid. No adjacent inflammation, free fluid or evidence of perforation. No high-grade bowel obstruction. Vascular/Lymphatic: Atherosclerotic calcifications throughout the abdominal aorta and branch vessels. No aneurysm or ectasia. No enlarged abdominopelvic lymph nodes. Reproductive: Uterus is surgically absent. No concerning adnexal lesions. Other: No abdominopelvic free fluid or free gas. No bowel containing hernias. Small fat containing umbilical hernia. Musculoskeletal: Prior L3-L5 posterior spinal fusion. No evidence of hardware failure or acute complication is seen. Mild anterior wedging and Schmorl's node at the L1 superior endplate is similar to comparison radiographs. Diffuse discogenic and facet degenerative changes noted throughout the lumbar spine at both instrumented in non instrumented levels. Additional degenerative changes in the hips and pelvis. The osseous structures appear diffusely demineralized which may limit detection of small or nondisplaced fractures. No acute or worrisome osseous lesions. IMPRESSION: 1. Large volume of inspissated fecal material noted throughout the colon. Mild distal colonic thickening versus underdistention of the sigmoid without evidence of  inflammation. Correlate for features of constipation. Furthermore, could consider correlation with most recent colonoscopy or outpatient visualization if not recently performed. 2. Similar thickening versus underdistention of the stomach, could reflect gastritis or secondary change given patient's prolonged emesis. 3. Moderately distended urinary bladder with layering air-fluid level. Could reflect recent instrumentation/catheterization, however if urinary symptoms are present, recommend further evaluation with urinalysis to exclude cystitis. 4. Cholelithiasis without evidence of acute cholecystitis. Mild focal thickening at the gallbladder fundus is suspected to reflect adenomyomatosis but could correlate with outpatient right upper quadrant ultrasound. Could perform on acute basis if biliary symptoms are present. 5.  Aortic Atherosclerosis (ICD10-I70.0). Electronically Signed   By: Lovena Le M.D.   On: 03/17/2020 19:37   X-ray chest PA and lateral  Result Date: 03/18/2020 CLINICAL DATA:  Acute renal insufficiency EXAM: CHEST - 2 VIEW COMPARISON:  December 26, 2014 FINDINGS: No pneumothorax. The cardiomediastinal silhouette is normal. No pulmonary nodules or masses. Mild bibasilar opacities, likely atelectasis. No overt edema. IMPRESSION: Probable mild bibasilar atelectasis. Recommend follow-up to resolution. No other acute abnormalities. Electronically Signed   By: Dorise Bullion III M.D   On: 03/18/2020 08:56   DG Chest Port 1 View  Result Date: 03/19/2020 CLINICAL DATA:  Shortness of breath. EXAM: PORTABLE CHEST 1 VIEW COMPARISON:  03/18/2020 FINDINGS: The cardiac silhouette, mediastinal and hilar contours are within normal limits and stable. Stable underlying emphysematous changes and pulmonary scarring. Suspect streaky bibasilar infiltrates. No pleural effusions. No worrisome pulmonary lesions. IMPRESSION: 1. Underlying emphysematous changes and pulmonary scarring. 2. Suspect streaky bibasilar  infiltrates. Electronically Signed   By: Marijo Sanes M.D.   On: 03/19/2020 07:59   US Abdomen Limited RUQ  Result Date: 03/18/2020 CLINICAL DATA:  Nausea and vomiting EXAM: ULTRASOUND ABDOMEN LIMITED RIGHT UPPER QUADRANT COMPARISON:  CT abdomen and pelvis 03/17/2020 FINDINGS: Gallbladder: Large shadowing calculus within gallbladder 19 mm diameter. No gallbladder wall thickening, pericholecystic fluid or sonographic Murphy sign. Common bile duct: Diameter: 3 mm, normal Liver: Normal appearance. No mass or nodularity. Portal vein is patent on color Doppler imaging with normal direction of blood flow towards the liver. Other: Trace perihepatic free fluid. IMPRESSION: Cholelithiasis without evidence of cholecystitis. Trace ascites adjacent to liver. Electronically Signed   By: Lavonia Dana M.D.   On: 03/18/2020 13:32    Labs:  CBC: Recent Labs    03/18/20 0116 03/18/20 0605 03/19/20 0256 03/20/20 0419  WBC 12.4* 12.0* 11.8* 11.5*  HGB 8.8* 8.4* 8.4* 8.1*  HCT 24.4* 23.5* 23.6* 23.2*  PLT 358 292 331 251    COAGS: No results for input(s): INR, APTT in the last 8760 hours.  BMP: Recent Labs    03/19/20 0256 03/19/20 0806 03/19/20 1207 03/19/20 1526 03/19/20 2347 03/20/20 0419  NA 114*   < > 117* 116* 117* 118*  K 4.2  --  4.2  --  3.7 3.6  CL 77*  --  81*  --  81* 83*  CO2 18*  --  17*  --  14* 15*  GLUCOSE 91  --  98  --  82 90  BUN 121*  --  120*  --  120* 121*  CALCIUM 8.4*  --  8.3*  --  8.1* 8.2*  CREATININE 8.50*  --  8.76*  --  8.93* 8.93*  GFRNONAA 4*  --  4*  --  4* 4*  GFRAA 5*  --  5*  --  5* 5*   < > = values in this interval not displayed.    LIVER FUNCTION TESTS: Recent Labs    03/01/20 0927 03/17/20 1821 03/17/20 1821 03/18/20 0954 03/18/20 1644 03/19/20 0256 03/20/20 0419  BILITOT 0.3 0.7  --   --   --  0.5 0.4  AST 25 19  --   --   --  18 20  ALT 10 13  --   --   --  11 13  ALKPHOS 90 71  --   --   --  60 62  PROT 7.4 7.8  --   --   --  6.6  6.4*  ALBUMIN 3.9  3.2*   < > 2.7* 2.4* 2.6* 2.4*   < > = values in this interval not displayed.    TUMOR MARKERS: No results for input(s): AFPTM, CEA, CA199, CHROMGRNA in the last 8760 hours.  Assessment and Plan:  Acute Kidney Injury; metabolic acidosis: Jasmine White, 74 year old female, is scheduled to receive a tunneled central venous catheter today in IR for hemodialysis and pheresis. Her BUN/Cr levels continue to climb (121/8.93 today) and her gap is still 20. Other pertinent lab work that has now resulted includes elevated anti-GBM and MPO levels. The nephrology team is preparing her for further treatment.  Risks and benefits discussed with the patient including, but not limited to bleeding, infection, vascular injury, pneumothorax which may require chest tube placement, air embolism or even death  All of the patient's questions were answered, patient is agreeable to proceed.  Consent signed and in chart.  The patient has been NPO. She is receiving SQ Heparin, last dose 03/20/20 at 0514.   Thank you for this interesting consult.  I greatly enjoyed meeting Jasmine White and look forward to participating in their care.  A copy of this report was sent to the requesting provider on this date.  Electronically Signed: Soyla Dryer, AGACNP-BC [phone] 03/20/2020, 8:25 AM   I spent a total of 20 Minutes  in face to face in clinical consultation, greater than 50% of which was counseling/coordinating care for tunneled central venous catheter.

## 2020-03-20 NOTE — Progress Notes (Signed)
Patient going to IR. No acute distress noted at this time, no complaints. Family at bedside.

## 2020-03-20 NOTE — Progress Notes (Signed)
Dialysis was called for report.

## 2020-03-20 NOTE — Plan of Care (Signed)
Spoke with patient who was accompanied at bedside by her husband and daughter. 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  FFP.  Will plan for every other day x 7 treatments.  - renal biopsy tentatively would be on 7/1 - optimizing with HD to reduce risk of bleed  # Hyponatremia  - she is on no continuous fluids presently - Discontinued every 4 hour sodium checks for now - lowest HD sodium setting tonight and tomorrow - early AM labs to guide if needs dextrose infusion  All questions answered.  Her family is very supportive.  We have discussed risks, benefits, and indications for dialysis, pheresis, and the renal biopsy and she consents to proceed with dialysis, pheresis, and the renal biopsy.    Claudia Desanctis, MD 5:16 PM 03/20/2020

## 2020-03-20 NOTE — Progress Notes (Signed)
Neuro checks was D/C per MD verbal order (Dr. Harrie Jeans).

## 2020-03-20 NOTE — Procedures (Signed)
Interventional Radiology Procedure Note  Procedure: Placement of a right IJ approach tunneled HD catheter, 19cm.  Tip is positioned at the superior cavoatrial junction and catheter is ready for immediate use.  Complications: None Recommendations:  - Ok to use - Do not submerge - Routine line care   Signed,  Dulcy Fanny. Earleen Newport, DO

## 2020-03-20 NOTE — Progress Notes (Signed)
PROGRESS NOTE                                                                                                                                                                                                             Patient Demographics:    Jasmine White, is a 74 y.o. female, DOB - Apr 14, 1946, TDV:[id number]  Admit date - 03/17/2020   Admitting Physician Clance Boll, MD  Outpatient Primary MD for the patient is Chevis Pretty, [place]  LOS - 3  Chief Complaint  Patient presents with  . Emesis       Brief Narrative -  Jasmine White is a 74 y.o. female with medical history significant of  Chronic back pain HTN, hypothyroidism, recently diagnosed CKD, HLD, Hx of COVID infection and recovery with persistent fatigue who presents with vomiting. Per patient states she has had intermittent n/v that started 9 days ago, in the ER she had changes suggestive of severe constipation and stool burden on CT scan, her blood numbers were suggestive of UTI, severe dehydration, hypernatremia and AKI.  She was admitted for further treatment.   Subjective:   Patient in bed, appears comfortable, denies any headache, no fever, no chest pain or pressure, no shortness of breath , no abdominal pain. No focal weakness.   Assessment  & Plan :     1.  AKI with severe dehydration and hyponatremia.  Likely brought on by HCTZ, UTI and superimposed nausea vomiting, function about 1 year ago appears stable, CT scan does not show any acute obstruction.  Her FEna was greater than 1 but she was also using HCTZ, nephrology is on board she was challenged with IV fluids however her sodium and renal function did not improve and serology was suggestive of anti-GBM antibodies and MPO positive suggesting some element of intrinsic renal injury, per nephrology she will require dialysis in the short-term and may require kidney biopsy as well with trial of 3% saline.  All renal issues will be deferred to  nephrology.  2. UTI.  On Rocephin x 3 .  3.  Hypothyroidism.  Continue home dose Synthroid, TSH is stable.  4.  Essential hypertension.  Already on beta-blocker along with hydralazine added Norvasc for better control.  5.  Chronic microcytic anemia.  Check anemia panel.  Was recently placed on iron supplementation by PCP, if iron deficient will defer to PCP on iron deficiency anemia work-up.  For now placed on PPI.  6.  Severe stool burden and  constipation.  Placed on aggressive bowel regimen.  Abdominal exam benign.  7.  Weakness and deconditioning.  PT OT.  8.  Acidosis due to renal insufficiency.  Bicarb per nephrology.    Condition - Extremely Guarded  Family Communication  : Daughter Jasmine White [phone] on 03/18/2020  Code Status :  Full  Consults  :  None  Procedures  :   CT - 1. Large volume of inspissated fecal material noted throughout the colon. Mild distal colonic thickening versus underdistention of the sigmoid without evidence of inflammation. Correlate for features of constipation. Furthermore, could consider correlation with most recent colonoscopy or outpatient visualization if not recently performed. 2. Similar thickening versus underdistention of the stomach, could reflect gastritis or secondary change given patient's prolonged emesis. 3. Moderately distended urinary bladder with layering air-fluid level. Could reflect recent instrumentation/catheterization, however if urinary symptoms are present, recommend further evaluation with urinalysis to exclude cystitis. 4. Cholelithiasis without evidence of acute cholecystitis. Mild focal thickening at the gallbladder fundus is suspected to reflect adenomyomatosis but could correlate with outpatient right upper quadrant ultrasound. Could perform on acute basis if biliary symptoms are present. 5. Aortic Atherosclerosis (ICD10-I70.0).  PUD Prophylaxis : PPI  Disposition Plan  :    Status is: Inpatient  Remains inpatient  appropriate because:IV treatments appropriate due to intensity of illness or inability to take PO   Dispo: The patient is from: Home              Anticipated d/c is to: SNF              Anticipated d/c date is: 3 days              Patient currently is not medically stable to d/c.   DVT Prophylaxis  :   Heparin    Lab Results  Component Value Date   PLT 251 03/20/2020    Diet :  Diet Order            Diet NPO time specified  Diet effective now                  Inpatient Medications Scheduled Meds: . amLODipine  10 mg Oral Daily  . Chlorhexidine Gluconate Cloth  6 each Topical Daily  . docusate sodium  200 mg Oral BID  . hydrALAZINE  50 mg Oral TID  . levothyroxine  88 mcg Oral Q0600  . metoprolol tartrate  50 mg Oral BID  . pantoprazole  40 mg Oral Daily  . polyethylene glycol  17 g Oral BID  . sodium bicarbonate  650 mg Oral BID   Continuous Infusions: . cefTRIAXone (ROCEPHIN)  IV 1 g (03/19/20 2304)  . sodium chloride (hypertonic) 30 mL/hr at 03/20/20 0807   PRN Meds:.acetaminophen **OR** [DISCONTINUED] acetaminophen, albuterol, [DISCONTINUED] ondansetron **OR** ondansetron (ZOFRAN) IV  Antibiotics  :   Anti-infectives (From admission, onward)   Start     Dose/Rate Route Frequency Ordered Stop   03/19/20 0000  cefTRIAXone (ROCEPHIN) 1 g in sodium chloride 0.9 % 100 mL IVPB     Discontinue    Note to Pharmacy: 1st dose now   1 g 200 mL/hr over 30 Minutes Intravenous Every 24 hours 03/18/20 0127     03/18/20 0145  cefTRIAXone (ROCEPHIN) 1 g in sodium chloride 0.9 % 100 mL IVPB       Note to Pharmacy: 1st dose now   1 g 200 mL/hr over 30 Minutes Intravenous  Once 03/18/20 0135 03/18/20 0252  Objective:   Vitals:   03/20/20 0323 03/20/20 0430 03/20/20 0629 03/20/20 0741  BP: (!) 182/76 (!) 162/71 (!) 163/80 (!) 171/80  Pulse:  70 76 74  Resp: (!) 24 17 (!) 23 (!) 21  Temp: 98.3 F (36.8 C) 98.2 F (36.8 C)  98.7 F (37.1 C)  TempSrc: Oral  Axillary  Oral  SpO2:  98% 97% 95%  Weight:      Height:        SpO2: 95 % O2 Flow Rate (L/min): 1 L/min  Wt Readings from Last 3 Encounters:  03/18/20 66.3 kg  03/01/20 61.7 kg  09/02/19 60.3 kg     Intake/Output Summary (Last 24 hours) at 03/20/2020 1024 Last data filed at 03/20/2020 0909 Gross per 24 hour  Intake 1640 ml  Output 400 ml  Net 1240 ml     Physical Exam  Awake Alert, No new F.N deficits, Normal affect [place].AT,PERRAL Supple Neck,No JVD, No cervical lymphadenopathy appriciated.  Symmetrical Chest wall movement, Good air movement bilaterally, few rales RRR,No Gallops, Rubs or new Murmurs, No Parasternal Heave +ve B.Sounds, Abd Soft, No tenderness, No organomegaly appriciated, No rebound - guarding or rigidity. No Cyanosis, Clubbing or edema, No new Rash or bruise    Data Review:    Recent Labs  Lab 03/17/20 1821 03/18/20 0116 03/18/20 0605 03/19/20 0256 03/20/20 0419  WBC 12.5* 12.4* 12.0* 11.8* 11.5*  HGB 9.5* 8.8* 8.4* 8.4* 8.1*  HCT 26.8* 24.4* 23.5* 23.6* 23.2*  PLT 429* 358 292 331 251  MCV 75.7* 75.5* 75.6* 75.9* 76.8*  MCH 26.8 27.2 27.0 27.0 26.8  MCHC 35.4 36.1* 35.7 35.6 34.9  RDW 13.1 13.2 13.1 13.2 13.3  LYMPHSABS  --   --   --  0.8 0.5*  MONOABS  --   --   --  0.6 0.4  EOSABS  --   --   --  0.1 0.0  BASOSABS  --   --   --  0.0 0.0    Recent Labs  Lab 03/17/20 1821 03/17/20 1821 03/17/20 2043 03/17/20 2043 03/18/20 0116 03/18/20 0605 03/18/20 0745 03/18/20 0748 03/18/20 0954 03/18/20 0954 03/18/20 1644 03/18/20 1644 03/18/20 2048 03/18/20 2048 03/19/20 0256 03/19/20 0806 03/19/20 1207 03/19/20 1526 03/19/20 2347 03/20/20 0419 03/20/20 0706  NA 109*   < > 112*   < > 112*   < >  --   --  113*   < > 112*   < > 113*   < > 114*   < > 117* 116* 117* 118* 118*  K 3.5   < > 3.4*   < > 3.3*   < >  --   --  4.4   < > 3.3*   < > 3.5  --  4.2  --  4.2  --  3.7 3.6  --   CL 68*   < > 73*   < > 74*   < >  --   --  74*   < >  75*   < > 76*  --  77*  --  81*  --  81* 83*  --   CO2 21*   < > 20*   < > 21*   < >  --   --  18*   < > 19*   < > 18*  --  18*  --  17*  --  14* 15*  --   GLUCOSE 102*   < > 95   < >  89   < >  --   --  87   < > 125*   < > 111*  --  91  --  98  --  82 90  --   BUN 118*   < > 116*   < > 117*   < >  --   --  121*   < > 121*   < > 123*  --  121*  --  120*  --  120* 121*  --   CREATININE 7.12*   < > 7.25*   < > 7.59*   < >  --   --  7.81*   < > 7.87*   < > 8.10*  --  8.50*  --  8.76*  --  8.93* 8.93*  --   CALCIUM 8.7*   < > 8.1*   < > 8.4*   < >  --   --  8.6*   < > 8.2*   < > 8.4*  --  8.4*  --  8.3*  --  8.1* 8.2*  --   AST 19  --   --   --   --   --   --   --   --   --   --   --   --   --  18  --   --   --   --  20  --   ALT 13  --   --   --   --   --   --   --   --   --   --   --   --   --  11  --   --   --   --  13  --   ALKPHOS 71  --   --   --   --   --   --   --   --   --   --   --   --   --  60  --   --   --   --  62  --   BILITOT 0.7  --   --   --   --   --   --   --   --   --   --   --   --   --  0.5  --   --   --   --  0.4  --   ALBUMIN 3.2*  --   --   --   --   --   --   --  2.7*  --  2.4*  --   --   --  2.6*  --   --   --   --  2.4*  --   MG  --   --   --   --   --   --   --   --   --   --   --   --   --   --  2.8*  --   --   --   --  2.7*  --   CRP  --   --   --   --   --   --  9.0*  --   --   --   --   --   --   --  11.4*  --   --   --   --  12.3*  --  DDIMER  --   --   --   --   --   --   --   --  3.07*  --   --   --   --   --  3.30*  --   --   --   --  3.65*  --   PROCALCITON  --   --   --   --   --   --  0.17  --   --   --   --   --   --   --  0.22  --   --   --   --  0.29  --   TSH  --   --  1.209  --  1.093  --   --   --   --   --   --   --   --   --   --   --   --   --   --   --   --   BNP  --   --   --   --   --   --   --  530.8*  --   --   --   --   --   --  1,430.6*  --   --   --   --  2,535.5*  --    < > = values in this interval not displayed.    Recent Labs  Lab  03/17/20 1918 03/18/20 0745 03/18/20 0748 03/18/20 0954 03/19/20 0256 03/20/20 0419  CRP  --  9.0*  --   --  11.4* 12.3*  DDIMER  --   --   --  3.07* 3.30* 3.65*  BNP  --   --  530.8*  --  1,430.6* 2,535.5*  PROCALCITON  --  0.17  --   --  0.22 0.29  SARSCOV2NAA NEGATIVE  --   --   --   --   --     ------------------------------------------------------------------------------------------------------------------ No results for input(s): CHOL, HDL, LDLCALC, TRIG, CHOLHDL, LDLDIRECT in the last 72 hours.  No results found for: HGBA1C ------------------------------------------------------------------------------------------------------------------ Recent Labs    03/18/20 0116  TSH 1.093   ------------------------------------------------------------------------------------------------------------------ Recent Labs    03/18/20 0954 03/19/20 0806  VITAMINB12 2,209* 2,263*  FOLATE 60.0 55.5  FERRITIN 634* 565*  TIBC 256 235*  IRON 87 98  RETICCTPCT 1.0 0.9    Coagulation profile No results for input(s): INR, PROTIME in the last 168 hours.  Recent Labs    03/19/20 0256 03/20/20 0419  DDIMER 3.30* 3.65*    Cardiac Enzymes No results for input(s): CKMB, TROPONINI, MYOGLOBIN in the last 168 hours.  Invalid input(s): CK ------------------------------------------------------------------------------------------------------------------    Component Value Date/Time   BNP 2,535.5 (H) 03/20/2020 9323    Micro Results Recent Results (from the past 240 hour(s))  SARS Coronavirus 2 by RT PCR (hospital order, performed in [facility] hospital lab) Nasopharyngeal Nasopharyngeal Swab     Status: None   Collection Time: 03/17/20  7:18 PM   Specimen: Nasopharyngeal Swab  Result Value Ref Range Status   SARS Coronavirus 2 NEGATIVE NEGATIVE Final    Comment: (NOTE) SARS-CoV-2 target nucleic acids are NOT DETECTED.  The SARS-CoV-2 RNA is generally detectable in upper and  lower respiratory specimens during the acute phase of infection. The lowest concentration of SARS-CoV-2 viral copies this assay can detect is 250 copies / mL. A negative result does not preclude SARS-CoV-2 infection and should  not be used as the sole basis for treatment or other patient management decisions.  A negative result may occur with improper specimen collection / handling, submission of specimen other than nasopharyngeal swab, presence of viral mutation(s) within the areas targeted by this assay, and inadequate number of viral copies (<250 copies / mL). A negative result must be combined with clinical observations, patient history, and epidemiological information.  Fact Sheet for Patients:   StrictlyIdeas.no  Fact Sheet for Healthcare Providers: BankingDealers.co.za  This test is not yet approved or  cleared by the [place] FDA and has been authorized for detection and/or diagnosis of SARS-CoV-2 by FDA under an Emergency Use Authorization (EUA).  This EUA will remain in effect (meaning this test can be used) for the duration of the COVID-19 declaration under Section 564(b)(1) of the Act, 21 U.S.C. section 360bbb-3(b)(1), unless the authorization is terminated or revoked sooner.  Performed at [facility], [address] [place], [place] [address]   MRSA PCR Screening     Status: None   Collection Time: 03/17/20 11:47 PM   Specimen: Nasal Mucosa; Nasopharyngeal  Result Value Ref Range Status   MRSA by PCR NEGATIVE NEGATIVE Final    Comment:        The GeneXpert MRSA Assay (FDA approved for NASAL specimens only), is one component of a comprehensive MRSA colonization surveillance program. It is not intended to diagnose MRSA infection nor to guide or monitor treatment for MRSA infections. Performed at [facility], [place] [address], [place], [place] [address]   Culture, Urine     Status: None    Collection Time: 03/19/20 10:43 AM   Specimen: Urine, Random  Result Value Ref Range Status   Specimen Description URINE, RANDOM  Final   Special Requests NONE  Final   Culture   Final    NO GROWTH Performed at [facility], [place] [address] [place], [place] [address]    Report Status 03/20/2020 FINAL  Final    Radiology Reports CT Abdomen Pelvis Wo Contrast  Result Date: 03/17/2020 CLINICAL DATA:  Vomiting for 1 week, low sella diam, elevated BUN and creatinine EXAM: CT ABDOMEN AND PELVIS WITHOUT CONTRAST TECHNIQUE: Multidetector CT imaging of the abdomen and pelvis was performed following the standard protocol without IV contrast. COMPARISON:  Chest radiograph 12/26/2014, lumbar radiograph 08/13/2012, MR lumbar spine 06/16/2012 FINDINGS: Lower chest: Scarring and reticular changes noted in the lung bases, left greater than right. No consolidative opacity or pleural effusion. Normal heart size. No pericardial effusion. Tiny benign appearing macrocalcification versus fiducial in the right breast. Hepatobiliary: No visible focal liver lesions. Smooth liver surface contour. Normal hepatic attenuation. Gallbladder is moderately distended. Some focal thickening is noted towards the gallbladder fundus, possible adenomyomatosis. A partially calcified gallstone noted layering dependently as well. No pericholecystic fluid or inflammation. Normal caliber biliary tree without visible calcified intraductal gallstones. Pancreas: Moderate pancreatic atrophy. No inflammation or ductal dilatation. No discernible pancreatic lesions. Spleen: Normal in size without focal abnormality. Adrenals/Urinary Tract: No concerning adrenal lesions. No visible or contour deforming renal lesions. No urolithiasis or hydronephrosis. Urinary bladder is moderately distended with layering air-fluid level. No significant pericholecystic inflammation or urinary bladder wall thickening. Stomach/Bowel: Question some mild thickening  versus underdistention of the stomach. Duodenum takes a normal course across the midline abdomen. No small bowel thickening or dilatation. Large volume of inspissated fecal material noted throughout the colon. Question some mild distal colonic thickening versus underdistention of the  sigmoid. No adjacent inflammation, free fluid or evidence of perforation. No high-grade bowel obstruction. Vascular/Lymphatic: Atherosclerotic calcifications throughout the abdominal aorta and branch vessels. No aneurysm or ectasia. No enlarged abdominopelvic lymph nodes. Reproductive: Uterus is surgically absent. No concerning adnexal lesions. Other: No abdominopelvic free fluid or free gas. No bowel containing hernias. Small fat containing umbilical hernia. Musculoskeletal: Prior L3-L5 posterior spinal fusion. No evidence of hardware failure or acute complication is seen. Mild anterior wedging and Schmorl's node at the L1 superior endplate is similar to comparison radiographs. Diffuse discogenic and facet degenerative changes noted throughout the lumbar spine at both instrumented in non instrumented levels. Additional degenerative changes in the hips and pelvis. The osseous structures appear diffusely demineralized which may limit detection of small or nondisplaced fractures. No acute or worrisome osseous lesions. IMPRESSION: 1. Large volume of inspissated fecal material noted throughout the colon. Mild distal colonic thickening versus underdistention of the sigmoid without evidence of inflammation. Correlate for features of constipation. Furthermore, could consider correlation with most recent colonoscopy or outpatient visualization if not recently performed. 2. Similar thickening versus underdistention of the stomach, could reflect gastritis or secondary change given patient's prolonged emesis. 3. Moderately distended urinary bladder with layering air-fluid level. Could reflect recent instrumentation/catheterization, however if  urinary symptoms are present, recommend further evaluation with urinalysis to exclude cystitis. 4. Cholelithiasis without evidence of acute cholecystitis. Mild focal thickening at the gallbladder fundus is suspected to reflect adenomyomatosis but could correlate with outpatient right upper quadrant ultrasound. Could perform on acute basis if biliary symptoms are present. 5. Aortic Atherosclerosis (ICD10-I70.0). Electronically Signed   By: Lovena Le M.D.   On: 03/17/2020 19:37   X-ray chest PA and lateral  Result Date: 03/18/2020 CLINICAL DATA:  Acute renal insufficiency EXAM: CHEST - 2 VIEW COMPARISON:  December 26, 2014 FINDINGS: No pneumothorax. The cardiomediastinal silhouette is normal. No pulmonary nodules or masses. Mild bibasilar opacities, likely atelectasis. No overt edema. IMPRESSION: Probable mild bibasilar atelectasis. Recommend follow-up to resolution. No other acute abnormalities. Electronically Signed   By: Dorise Bullion III M.D   On: 03/18/2020 08:56   DG Chest Port 1 View  Result Date: 03/19/2020 CLINICAL DATA:  Shortness of breath. EXAM: PORTABLE CHEST 1 VIEW COMPARISON:  03/18/2020 FINDINGS: The cardiac silhouette, mediastinal and hilar contours are within normal limits and stable. Stable underlying emphysematous changes and pulmonary scarring. Suspect streaky bibasilar infiltrates. No pleural effusions. No worrisome pulmonary lesions. IMPRESSION: 1. Underlying emphysematous changes and pulmonary scarring. 2. Suspect streaky bibasilar infiltrates. Electronically Signed   By: Marijo Sanes M.D.   On: 03/19/2020 07:59   US Abdomen Limited RUQ  Result Date: 03/18/2020 CLINICAL DATA:  Nausea and vomiting EXAM: ULTRASOUND ABDOMEN LIMITED RIGHT UPPER QUADRANT COMPARISON:  CT abdomen and pelvis 03/17/2020 FINDINGS: Gallbladder: Large shadowing calculus within gallbladder 19 mm diameter. No gallbladder wall thickening, pericholecystic fluid or sonographic Murphy sign. Common bile duct:  Diameter: 3 mm, normal Liver: Normal appearance. No mass or nodularity. Portal vein is patent on color Doppler imaging with normal direction of blood flow towards the liver. Other: Trace perihepatic free fluid. IMPRESSION: Cholelithiasis without evidence of cholecystitis. Trace ascites adjacent to liver. Electronically Signed   By: Lavonia Dana M.D.   On: 03/18/2020 13:32    Time Spent in minutes  30   Lala Lund M.D on 03/20/2020 at 10:24 AM  To page go to [email or web address] - password [email or web address]

## 2020-03-20 NOTE — Progress Notes (Addendum)
[facility] Progress Note  Name: Jasmine White MRN: [id number] DOB: 05/29/1946  Chief Complaint:  n/v  Subjective:  Labs back with GBM and MPO positive.  Had 400 mL UOP charted over 6/28.  Variable fluids during the day - NS was ordered at 100/hr and may have been off/on.  She was NPO for part of the day and later given a diet.  She confirms would want a biopsy if needed or HD if needed.  Got a dose of sodium bicarb PO yesterday.   Review of systems:    Nausea better  Has been NPO overnight  Reports some shortness of breath  denies chest pain  States feels weak  Denies constipation  -------------  Background on consult:  HPI: Pt is a 2F with a PMH sig for HTN, HLD, hypothyroidism, COVID in November who is now seen in consultation at the request of Dr. Candiss Norse for eval and recs re: AKI, hyponatremia.  Pt has had COVID in November and since then hasn't felt right.  Having burning every night in her legs for which she was taking Advil nightly.  Takes hyzaar for HTN.  Previously Cr was 0.78 in Dec 2020.  Went to PCP 03/01/20 and was 2.34 then.  Has had one week of n/v.  Has still been taking her Advil and her hyzaar.  Went to [facility] last night where she was found to have Cr of > 7, BUN 119, and severe hyponatremia at 109. CT scan showed large amount of inspissated stool and possibly thickened sigmoid colon and stomach/ GB.  Was given 1L NS, started on 50 mL/ hr NS, and transferred to [facility] for admission.  Pt states she's very thirsty right now.  No dizziness/ falls/ seizure like activity.  She has been hypertensive here.  Urine lytes show isosthenuria with urine Na 24.  (? If before or after IV NS bolus).  Na 112 this AM, no real movement in BUN/ Cr.  No HA, f/c, SOB, rashes, nosebleeds, red/ itchy eyes, nasal crusting, coughing up blood, blood in urine/ stool.  Says some foamy urine.    Intake/Output Summary (Last 24 hours) at 03/20/2020 0713 Last data filed at 03/20/2020  0532 Gross per 24 hour  Intake 1580 ml  Output 400 ml  Net 1180 ml    Vitals:  Vitals:   03/20/20 0133 03/20/20 0323 03/20/20 0430 03/20/20 0629  BP: (!) 150/64 (!) 182/76 (!) 162/71 (!) 163/80  Pulse: 68  70 76  Resp:  (!) 24 17 (!) 23  Temp:  98.3 F (36.8 C) 98.2 F (36.8 C)   TempSrc:  Oral Axillary   SpO2: 93%  98% 97%  Weight:      Height:         Physical Exam:   General adult female in bed in no acute distress HEENT normocephalic atraumatic extraocular movements intact sclera anicteric Neck supple trachea midline Lungs crackles on auscultation bilaterally normal work of breathing at rest  Heart S1S2 no rub Abdomen soft nontender nondistended Extremities no pitting edema  appreciated Psych normal mood and affect Neuro alert and oriented x 3 provides hx and follows commands  GU - foley catheter in place with urine  Medications reviewed   Labs:  BMP Latest Ref Rng & Units 03/20/2020 03/19/2020 03/19/2020  Glucose 70 - 99 mg/dL 90 82 -  BUN 8 - 23 mg/dL 121(H) 120(H) -  Creatinine 0.44 - 1.00 mg/dL 8.93(H) 8.93(H) -  BUN/Creat Ratio 12 -  28 - - -  Sodium 135 - 145 mmol/L 118(LL) 117(LL) 116(LL)  Potassium 3.5 - 5.1 mmol/L 3.6 3.7 -  Chloride 98 - 111 mmol/L 83(L) 81(L) -  CO2 22 - 32 mmol/L 15(L) 14(L) -  Calcium 8.9 - 10.3 mg/dL 8.2(L) 8.1(L) -     Assessment/Plan:   # AKI: baseline Cr 0.78.  Had Cr of 2.3 2 weeks ago and now is > 7.0 in the setting of 1 week of n/v, Advil, and Hyzaar.  Suspect at least some ATN component.  UA + blood and protein with WBCs too, many bactereria.  No emergent indications for dialysis but need to follow closely, fortunately not uremic.  Work-up has included: Hep C nonreactive, hep B negative, HIV nonreactive.  CT with mod distended urinary bladder, no hydro.  Anti-GBM elevated at 137, MPO elevated at 10.3.  PR3 neg. Complement normal. SPEP no M spike. free light chains ratio normal. ASO negative.  Discussed with pt the  recommendation for a renal biopsy and dialysis and she is willing to pursue both ------------- - Anti-GBM elevated at 137, MPO elevated at 10.3.  Needs renal biopsy  - Consult IR for tunnneled catheter for HD as well as pheresis  - HD after tunneled catheter and will contact HD unit re: pheresis timing - ANA still pending  - stopping fluids - Still needs up/cr ratio collected - reordered  - Continue foley catheter for now   - pause Kuna heparin - added scd's  # Severe hypovolemic hyponatremia: in setting of n/v and HCTZ use as well as profound AKI.  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  Cautious correction to avoid adverse outcome.   - Keep NPO for now for procedure  - short run of 3% saline at 30 ml/hr and titrate as needed (set at 6 hours) - check Na every 2 hours x 2 then per protocol - Stop normal saline   - hypothyroidism per primary team    # Proteinuria  - with concurrent hematuria - work-up as above is requested - needs up/cr ratio sent - I have reordered   # Metabolic acidosis - 2/2 AKI  - Continue sodium bicarbonate until she starts HD   # Constipation, severe: please avoid all Mg containing laxatives/ enemas and fleets enemas.  Per primary   # HTN: holding her home hyzaar with AKI.  Increase hydralazine to 50 mg TID  # Hyperphosphatemia - renal diet when taking PO and will need renvela when taking PO.  Check phos daily    # Possible UTI- ordered urine culture - not able to locate one; on ceftriaxone    # Abnormal CT scan- RUQ [place] with cholelithiasis without evidence of cholecystitis   # Microcytic anemia  - iron, B12, and folate acceptable   # Dispo: continue inpatient monitoring   Claudia Desanctis, MD 03/20/2020 7:50 AM

## 2020-03-21 DIAGNOSIS — N019 Rapidly progressive nephritic syndrome with unspecified morphologic changes: Secondary | ICD-10-CM

## 2020-03-21 LAB — CBC WITH DIFFERENTIAL/PLATELET
Abs Immature Granulocytes: 0.12 10*3/uL — ABNORMAL HIGH (ref 0.00–0.07)
Basophils Absolute: 0 10*3/uL (ref 0.0–0.1)
Basophils Relative: 0 %
Eosinophils Absolute: 0 10*3/uL (ref 0.0–0.5)
Eosinophils Relative: 0 %
HCT: 24.7 % — ABNORMAL LOW (ref 36.0–46.0)
Hemoglobin: 8.5 g/dL — ABNORMAL LOW (ref 12.0–15.0)
Immature Granulocytes: 1 %
Lymphocytes Relative: 2 %
Lymphs Abs: 0.2 10*3/uL — ABNORMAL LOW (ref 0.7–4.0)
MCH: 26.4 pg (ref 26.0–34.0)
MCHC: 34.4 g/dL (ref 30.0–36.0)
MCV: 76.7 fL — ABNORMAL LOW (ref 80.0–100.0)
Monocytes Absolute: 0 10*3/uL — ABNORMAL LOW (ref 0.1–1.0)
Monocytes Relative: 0 %
Neutro Abs: 10.3 10*3/uL — ABNORMAL HIGH (ref 1.7–7.7)
Neutrophils Relative %: 97 %
Platelets: 289 10*3/uL (ref 150–400)
RBC: 3.22 MIL/uL — ABNORMAL LOW (ref 3.87–5.11)
RDW: 13.6 % (ref 11.5–15.5)
WBC: 10.7 10*3/uL — ABNORMAL HIGH (ref 4.0–10.5)
nRBC: 0 % (ref 0.0–0.2)

## 2020-03-21 LAB — COMPREHENSIVE METABOLIC PANEL
ALT: 13 U/L (ref 0–44)
AST: 22 U/L (ref 15–41)
Albumin: 2.5 g/dL — ABNORMAL LOW (ref 3.5–5.0)
Alkaline Phosphatase: 70 U/L (ref 38–126)
Anion gap: 17 — ABNORMAL HIGH (ref 5–15)
BUN: 74 mg/dL — ABNORMAL HIGH (ref 8–23)
CO2: 18 mmol/L — ABNORMAL LOW (ref 22–32)
Calcium: 8.5 mg/dL — ABNORMAL LOW (ref 8.9–10.3)
Chloride: 93 mmol/L — ABNORMAL LOW (ref 98–111)
Creatinine, Ser: 6.82 mg/dL — ABNORMAL HIGH (ref 0.44–1.00)
GFR calc Af Amer: 6 mL/min — ABNORMAL LOW (ref >60)
GFR calc non Af Amer: 5 mL/min — ABNORMAL LOW (ref >60)
Glucose, Bld: 170 mg/dL — ABNORMAL HIGH (ref 70–99)
Potassium: 3.8 mmol/L (ref 3.5–5.1)
Sodium: 128 mmol/L — ABNORMAL LOW (ref 135–145)
Total Bilirubin: 1 mg/dL (ref 0.3–1.2)
Total Protein: 6.7 g/dL (ref 6.5–8.1)

## 2020-03-21 LAB — PHOSPHORUS: Phosphorus: 7.5 mg/dL — ABNORMAL HIGH (ref 2.5–4.6)

## 2020-03-21 LAB — C-REACTIVE PROTEIN: CRP: 19.2 mg/dL — ABNORMAL HIGH (ref <1.0)

## 2020-03-21 LAB — HEMOGLOBIN A1C
Hgb A1c MFr Bld: 5.9 % — ABNORMAL HIGH (ref 4.8–5.6)
Mean Plasma Glucose: 122.63 mg/dL

## 2020-03-21 LAB — MAGNESIUM: Magnesium: 2.6 mg/dL — ABNORMAL HIGH (ref 1.7–2.4)

## 2020-03-21 LAB — D-DIMER, QUANTITATIVE: D-Dimer, Quant: 7.21 ug/mL-FEU — ABNORMAL HIGH (ref 0.00–0.50)

## 2020-03-21 LAB — BRAIN NATRIURETIC PEPTIDE: B Natriuretic Peptide: 2263.4 pg/mL — ABNORMAL HIGH (ref 0.0–100.0)

## 2020-03-21 MED ORDER — ACETAMINOPHEN 325 MG PO TABS: 650.0000 mg | ORAL_TABLET | ORAL | Status: DC | PRN

## 2020-03-21 MED ORDER — CALCIUM GLUCONATE-NACL 2-0.675 GM/100ML-% IV SOLN
2.0000 g | Freq: Once | INTRAVENOUS | Status: AC
  Administered 2020-03-21: 2000 mg via INTRAVENOUS
  Filled 2020-03-21: qty 100

## 2020-03-21 MED ORDER — DIPHENHYDRAMINE HCL 25 MG PO CAPS: 25.0000 mg | ORAL_CAPSULE | Freq: Four times a day (QID) | ORAL | Status: DC | PRN

## 2020-03-21 MED ORDER — CALCIUM CARBONATE ANTACID 500 MG PO CHEW
2.0000 | CHEWABLE_TABLET | ORAL | Status: DC
  Administered 2020-03-21: 400 mg via ORAL

## 2020-03-21 MED ORDER — ANTICOAGULANT SODIUM CITRATE 4% (200MG/5ML) IV SOLN
5.0000 mL | Freq: Once | Status: AC
  Administered 2020-03-21: 5 mL
  Filled 2020-03-21: qty 5

## 2020-03-21 MED ORDER — ACD FORMULA A 0.73-2.45-2.2 GM/100ML VI SOLN
Status: AC
  Filled 2020-03-21: qty 500

## 2020-03-21 MED ORDER — ACD FORMULA A 0.73-2.45-2.2 GM/100ML VI SOLN
1000.0000 mL | Status: DC
  Administered 2020-03-21: 1000 mL

## 2020-03-21 MED ORDER — CALCIUM CARBONATE ANTACID 500 MG PO CHEW
CHEWABLE_TABLET | ORAL | Status: AC
  Filled 2020-03-21: qty 2

## 2020-03-21 MED ORDER — SEVELAMER CARBONATE 800 MG PO TABS
800.0000 mg | ORAL_TABLET | Freq: Three times a day (TID) | ORAL | Status: DC
  Administered 2020-03-21 – 2020-03-22 (×4): 800 mg via ORAL
  Filled 2020-03-21 (×4): qty 1

## 2020-03-21 NOTE — Progress Notes (Signed)
Physical Therapy Treatment Patient Details Name: Jasmine White MRN: [id number] DOB: 12-02-1945 Today's Date: 03/21/2020    History of Present Illness 74 y.o. female with medical history significant of chronic back pain, HTN, hypothyroidism, recently diagnosed CKD, HLD, Hx of COVID infection and recovery with persistent fatigue who presented 03/18/20 with nausea/vomiting x 9 days. Severe hypovolemic hyponatremia, AKI, UTI, HTN    PT Comments    Patient tolerating activity far better today. Remains globally weak and requiring education on safe use of RW. Of note, pt's husband uses a wheelchair full-time and patient needs to be independent prior to return home. Agree with recommending CIR for further therapy prior to discharge home.     Follow Up Recommendations  CIR     Equipment Recommendations  None recommended by PT (owns RW)    Recommendations for Other Services Rehab consult     Precautions / Restrictions Precautions Precautions: Fall    Mobility  Bed Mobility Overal bed mobility: Needs Assistance Bed Mobility: Supine to Sit     Supine to sit: Supervision     General bed mobility comments: supervision due to mild dizziness  Transfers Overall transfer level: Needs assistance Equipment used: Rolling walker (2 wheeled) Transfers: Sit to/from Stand Sit to Stand: Min guard         General transfer comment: vc for hand placement from bed   Ambulation/Gait Ambulation/Gait assistance: Min guard Gait Distance (Feet): 40 Feet Assistive device: Rolling walker (2 wheeled) Gait Pattern/deviations: Step-through pattern;Decreased stride length;Trunk flexed     General Gait Details: vc for upright posture   Stairs             Wheelchair Mobility    Modified Rankin (Stroke Patients Only)       Balance Overall balance assessment: Mild deficits observed, not formally tested                                          Cognition  Arousal/Alertness: Awake/alert Behavior During Therapy: WFL for tasks assessed/performed Overall Cognitive Status: Within Functional Limits for tasks assessed                                 General Comments: affect brighter and more briskly following commands      Exercises Low Level/ICU Exercises Ankle Circles/Pumps: AROM;Both;10 reps;Supine Quad Sets: Strengthening;Both;10 reps;Supine Stabilized Bridging: AROM;Both;5 reps;Supine (educated to lift 1" to protect back)    General Comments        Pertinent Vitals/Pain Pain Assessment: No/denies pain    Home Living                      Prior Function            PT Goals (current goals can now be found in the care plan section) Acute Rehab PT Goals Patient Stated Goal: get strong enough to go home Time For Goal Achievement: 04/02/20 Potential to Achieve Goals: Fair Progress towards PT goals: Progressing toward goals    Frequency    Min 3X/week      PT Plan Discharge plan needs to be updated    Co-evaluation              AM-PAC PT "6 Clicks" Mobility   Outcome Measure  Help needed turning from your back to your side  while in a flat bed without using bedrails?: None Help needed moving from lying on your back to sitting on the side of a flat bed without using bedrails?: A Little Help needed moving to and from a bed to a chair (including a wheelchair)?: A Little Help needed standing up from a chair using your arms (e.g., wheelchair or bedside chair)?: A Little Help needed to walk in hospital room?: A Little Help needed climbing 3-5 steps with a railing? : A Little 6 Click Score: 19    End of Session Equipment Utilized During Treatment: Gait belt Activity Tolerance: Patient tolerated treatment well Patient left: with call bell/phone within reach;in chair;with chair alarm set Nurse Communication: Mobility status PT Visit Diagnosis: Muscle weakness (generalized) (M62.81);Difficulty in  walking, not elsewhere classified (R26.2);Dizziness and giddiness (R42)     Time: 1696-7893 PT Time Calculation (min) (ACUTE ONLY): 34 min  Charges:  $Gait Training: 8-22 mins $Therapeutic Exercise: 8-22 mins                      Arby Barrette, PT Pager [phone]    Rexanne Mano 03/21/2020, 12:15 PM

## 2020-03-21 NOTE — Progress Notes (Signed)
PROGRESS NOTE                                                                                                                                                                                                             Patient Demographics:    Jasmine White, is a 74 y.o. female, DOB - March 19, 1946, TDH:[id number]  Admit date - 03/17/2020   Admitting Physician Clance Boll, MD  Outpatient Primary MD for the patient is Chevis Pretty, [place]  LOS - 4  Chief Complaint  Patient presents with  . Emesis       Brief Narrative -  Jasmine White is a 74 y.o. female with medical history significant of  Chronic back pain HTN, hypothyroidism, recently diagnosed CKD, HLD, Hx of COVID infection and recovery with persistent fatigue who presents with vomiting. Per patient states she has had intermittent n/v that started 9 days ago, in the ER she had changes suggestive of severe constipation and stool burden on CT scan, her blood numbers were suggestive of UTI, severe dehydration, hypernatremia and AKI.  She was admitted for further treatment.   Subjective:   Patient in bed, appears comfortable, denies any headache, no fever, no chest pain or pressure, no shortness of breath , no abdominal pain. No focal weakness.   Assessment  & Plan :     1.  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 -Anti-GBM elevated at 137, MPO elevated at 10.3.  Note ANA also positive and anti-dsDNA elevated -Management per nephrology, treatment scheduled IV Solu-Medrol 1 g daily x3 daily, start date 6/29, and plan to treat for total of 7 days off pheresis, she will need biopsy by IR as well. -Management per renal  2. UTI.  On Rocephin x 3 .  3.  Hyponatremia -Severe hypovolemic hyponatremia, in the setting of nausea and vomiting and hydrochlorothiazide use,  management per renal.  4.  Essential hypertension.  Already on beta-blocker along with hydralazine added Norvasc for better control.  5.  Chronic microcytic anemia.  Check anemia panel.  Was recently placed on iron supplementation by PCP, if iron deficient will defer to PCP on iron deficiency anemia work-up.  For now placed on PPI.  6.  Severe stool burden and constipation.  Placed on aggressive bowel regimen.  Abdominal exam benign.  7.  Weakness and deconditioning.  PT OT.  8.  Acidosis due to renal insufficiency.  Bicarb per nephrology.  9.  Hypothyroidism.  Continue home dose Synthroid, TSH is stable.  Condition - Extremely Guarded  Family Communication  : Discussed in detail with the patient  Code Status :  Full  Consults  :  None  Procedures  :   CT - 1. Large volume of inspissated fecal material noted throughout the colon. Mild distal colonic thickening versus underdistention of the sigmoid without evidence of inflammation. Correlate for features of constipation. Furthermore, could consider correlation with most recent colonoscopy or outpatient visualization if not recently performed. 2. Similar thickening versus underdistention of the stomach, could reflect gastritis or secondary change given patient's prolonged emesis. 3. Moderately distended urinary bladder with layering air-fluid level. Could reflect recent instrumentation/catheterization, however if urinary symptoms are present, recommend further evaluation with urinalysis to exclude cystitis. 4. Cholelithiasis without evidence of acute cholecystitis. Mild focal thickening at the gallbladder fundus is suspected to reflect adenomyomatosis but could correlate with outpatient right upper quadrant ultrasound. Could perform on acute basis if biliary symptoms are present. 5. Aortic Atherosclerosis (ICD10-I70.0).  PUD Prophylaxis : PPI  Disposition Plan  :    Status is: Inpatient  Remains inpatient appropriate because:IV  treatments appropriate due to intensity of illness or inability to take PO   Dispo: The patient is from: Home              Anticipated d/c is to: SNF              Anticipated d/c date is: 3 days              Patient currently is not medically stable to d/c.   DVT Prophylaxis  :   Heparin    Lab Results  Component Value Date   PLT 289 03/21/2020    Diet :  Diet Order            Diet NPO time specified Except for: Sips with Meds  Diet effective midnight           Diet renal with fluid restriction Fluid restriction: 1800 mL Fluid; Room service appropriate? No; Fluid consistency: Thin  Diet effective now                  Inpatient Medications Scheduled Meds: . amLODipine  10 mg Oral Daily  . calcium carbonate      . calcium carbonate  2 tablet Oral Q3H  . Chlorhexidine Gluconate Cloth  6 each Topical Q0600  . docusate sodium  200 mg Oral BID  . hydrALAZINE  50 mg Oral TID  . levothyroxine  88 mcg Oral Q0600  . metoprolol tartrate  50 mg Oral BID  . pantoprazole  40 mg Oral Daily  . polyethylene glycol  17 g Oral BID  . sevelamer carbonate  800 mg Oral TID WC   Continuous Infusions: . anticoagulant sodium citrate    . calcium gluconate 2,000 mg (03/21/20 1320)  . citrate dextrose    . citrate dextrose    . methylPREDNISolone (SOLU-MEDROL) injection 1,000 mg (03/20/20 1745)   PRN Meds:.acetaminophen **OR** [DISCONTINUED] acetaminophen, acetaminophen, albuterol, diphenhydrAMINE, [DISCONTINUED] ondansetron **OR** ondansetron (ZOFRAN) IV  Antibiotics  :   Anti-infectives (From admission, onward)   Start     Dose/Rate Route Frequency Ordered Stop   03/20/20 1319  ceFAZolin (ANCEF) 2-4 GM/100ML-% IVPB       Note to Pharmacy: Jasmine White   : cabinet override      03/20/20 1319  03/21/20 0129   03/20/20 1100  ceFAZolin (ANCEF) IVPB 2g/100 mL premix        2 g 200 mL/hr over 30 Minutes Intravenous On call 03/20/20 1040 03/20/20 1506   03/19/20 0000  cefTRIAXone  (ROCEPHIN) 1 g in sodium chloride 0.9 % 100 mL IVPB       Note to Pharmacy: 1st dose now   1 g 200 mL/hr over 30 Minutes Intravenous Every 24 hours 03/18/20 0127 03/21/20 0008   03/18/20 0145  cefTRIAXone (ROCEPHIN) 1 g in sodium chloride 0.9 % 100 mL IVPB       Note to Pharmacy: 1st dose now   1 g 200 mL/hr over 30 Minutes Intravenous  Once 03/18/20 0135 03/18/20 0252          Objective:   Vitals:   03/21/20 1320 03/21/20 1332 03/21/20 1345 03/21/20 1359  BP: (!) 151/80 (!) 150/81 (!) 150/62 (!) 144/67  Pulse: 85 87 89 89  Resp: (!) 23 (!) 23 (!) 22 20  Temp: 98.3 F (36.8 C) 98.2 F (36.8 C) 98.2 F (36.8 C) 98.1 F (36.7 C)  TempSrc: Oral Oral Oral Oral  SpO2:      Weight:      Height:        SpO2: 98 % O2 Flow Rate (L/min): 3 L/min  Wt Readings from Last 3 Encounters:  03/21/20 64 kg  03/01/20 61.7 kg  09/02/19 60.3 kg     Intake/Output Summary (Last 24 hours) at 03/21/2020 1414 Last data filed at 03/21/2020 1247 Gross per 24 hour  Intake 650 ml  Output 1025 ml  Net -375 ml     Physical Exam  Awake Alert, Oriented X 3, No new F.N deficits, Normal affect Symmetrical Chest wall movement, Good air movement bilaterally, CTAB RRR,No Gallops,Rubs or new Murmurs, No Parasternal Heave +ve B.Sounds, Abd Soft, No tenderness, No rebound - guarding or rigidity. No Cyanosis, Clubbing or edema, No new Rash or bruise      Data Review:    Recent Labs  Lab 03/18/20 0116 03/18/20 0605 03/19/20 0256 03/20/20 0419 03/21/20 0509  WBC 12.4* 12.0* 11.8* 11.5* 10.7*  HGB 8.8* 8.4* 8.4* 8.1* 8.5*  HCT 24.4* 23.5* 23.6* 23.2* 24.7*  PLT 358 292 331 251 289  MCV 75.5* 75.6* 75.9* 76.8* 76.7*  MCH 27.2 27.0 27.0 26.8 26.4  MCHC 36.1* 35.7 35.6 34.9 34.4  RDW 13.2 13.1 13.2 13.3 13.6  LYMPHSABS  --   --  0.8 0.5* 0.2*  MONOABS  --   --  0.6 0.4 0.0*  EOSABS  --   --  0.1 0.0 0.0  BASOSABS  --   --  0.0 0.0 0.0    Recent Labs  Lab 03/17/20 1821 03/17/20 1821  03/17/20 2043 03/17/20 2043 03/18/20 0116 03/18/20 0605 03/18/20 0745 03/18/20 0748 03/18/20 0954 03/18/20 0954 03/18/20 1644 03/18/20 2048 03/19/20 0256 03/19/20 0806 03/19/20 1207 03/19/20 1526 03/19/20 2347 03/19/20 2347 03/20/20 0419 03/20/20 0706 03/20/20 0953 03/20/20 1244 03/21/20 0509  NA 109*   < > 112*   < > 112*   < >  --   --  113*   < > 112*   < > 114*   < > 117*   < > 117*   < > 118* 118* 119* 121* 128*  K 3.5   < > 3.4*   < > 3.3*   < >  --   --  4.4   < > 3.3*   < >  4.2  --  4.2  --  3.7  --  3.6  --   --  4.0 3.8  CL 68*   < > 73*   < > 74*   < >  --   --  74*   < > 75*   < > 77*  --  81*  --  81*  --  83*  --   --  85* 93*  CO2 21*   < > 20*   < > 21*   < >  --   --  18*   < > 19*   < > 18*  --  17*  --  14*  --  15*  --   --  16* 18*  GLUCOSE 102*   < > 95   < > 89   < >  --   --  87   < > 125*   < > 91  --  98  --  82  --  90  --   --  100* 170*  BUN 118*   < > 116*   < > 117*   < >  --   --  121*   < > 121*   < > 121*  --  120*  --  120*  --  121*  --   --  123* 74*  CREATININE 7.12*   < > 7.25*   < > 7.59*   < >  --   --  7.81*   < > 7.87*   < > 8.50*  --  8.76*  --  8.93*  --  8.93*  --   --  9.38* 6.82*  CALCIUM 8.7*   < > 8.1*   < > 8.4*   < >  --   --  8.6*   < > 8.2*   < > 8.4*  --  8.3*  --  8.1*  --  8.2*  --   --  8.1* 8.5*  AST 19  --   --   --   --   --   --   --   --   --   --   --  18  --   --   --   --   --  20  --   --   --  22  ALT 13  --   --   --   --   --   --   --   --   --   --   --  11  --   --   --   --   --  13  --   --   --  13  ALKPHOS 71  --   --   --   --   --   --   --   --   --   --   --  60  --   --   --   --   --  62  --   --   --  70  BILITOT 0.7  --   --   --   --   --   --   --   --   --   --   --  0.5  --   --   --   --   --  0.4  --   --   --  1.0  ALBUMIN 3.2*   < >  --   --   --   --   --   --  2.7*  --  2.4*  --  2.6*  --   --   --   --   --  2.4*  --   --   --  2.5*  MG  --   --   --   --   --   --   --   --   --   --    --   --  2.8*  --   --   --   --   --  2.7*  --   --   --  2.6*  CRP  --   --   --   --   --   --  9.0*  --   --   --   --   --  11.4*  --   --   --   --   --  12.3*  --   --   --  19.2*  DDIMER  --   --   --   --   --   --   --   --  3.07*  --   --   --  3.30*  --   --   --   --   --  3.65*  --   --   --  7.21*  PROCALCITON  --   --   --   --   --   --  0.17  --   --   --   --   --  0.22  --   --   --   --   --  0.29  --   --   --   --   TSH  --   --  1.209  --  1.093  --   --   --   --   --   --   --   --   --   --   --   --   --   --   --   --   --   --   BNP  --   --   --   --   --   --   --  530.8*  --   --   --   --  1,430.6*  --   --   --   --   --  2,535.5*  --   --   --  2,263.4*   < > = values in this interval not displayed.    Recent Labs  Lab 03/17/20 1918 03/18/20 0745 03/18/20 0748 03/18/20 0954 03/19/20 0256 03/20/20 0419 03/21/20 0509  CRP  --  9.0*  --   --  11.4* 12.3* 19.2*  DDIMER  --   --   --  3.07* 3.30* 3.65* 7.21*  BNP  --   --  530.8*  --  1,430.6* 2,535.5* 2,263.4*  PROCALCITON  --  0.17  --   --  0.22 0.29  --   SARSCOV2NAA NEGATIVE  --   --   --   --   --   --     ------------------------------------------------------------------------------------------------------------------ No results for input(s): CHOL, HDL, LDLCALC, TRIG, CHOLHDL, LDLDIRECT in the last 72 hours.  No results found for: HGBA1C ------------------------------------------------------------------------------------------------------------------ No results for input(s): TSH, T4TOTAL, T3FREE, THYROIDAB in the last 72 hours.  Invalid input(s): FREET3 ------------------------------------------------------------------------------------------------------------------ Recent Labs    03/19/20 0806  VITAMINB12 2,263*  FOLATE 55.5  FERRITIN 565*  TIBC 235*  IRON 98  RETICCTPCT 0.9    Coagulation profile No results for input(s): INR, PROTIME in the last 168 hours.  Recent Labs     03/20/20 0419 03/21/20 0509  DDIMER 3.65* 7.21*    Cardiac Enzymes No results for input(s): CKMB, TROPONINI, MYOGLOBIN in the last 168 hours.  Invalid input(s): CK ------------------------------------------------------------------------------------------------------------------    Component Value Date/Time   BNP 2,263.4 (H) 03/21/2020 5102    Micro Results Recent Results (from the past 240 hour(s))  SARS Coronavirus 2 by RT PCR (hospital order, performed in [facility] hospital lab) Nasopharyngeal Nasopharyngeal Swab     Status: None   Collection Time: 03/17/20  7:18 PM   Specimen: Nasopharyngeal Swab  Result Value Ref Range Status   SARS Coronavirus 2 NEGATIVE NEGATIVE Final    Comment: (NOTE) SARS-CoV-2 target nucleic acids are NOT DETECTED.  The SARS-CoV-2 RNA is generally detectable in upper and lower respiratory specimens during the acute phase of infection. The lowest concentration of SARS-CoV-2 viral copies this assay can detect is 250 copies / mL. A negative result does not preclude SARS-CoV-2 infection and should not be used as the sole basis for treatment or other patient management decisions.  A negative result may occur with improper specimen collection / handling, submission of specimen other than nasopharyngeal swab, presence of viral mutation(s) within the areas targeted by this assay, and inadequate number of viral copies (<250 copies / mL). A negative result must be combined with clinical observations, patient history, and epidemiological information.  Fact Sheet for Patients:   StrictlyIdeas.no  Fact Sheet for Healthcare Providers: BankingDealers.co.za  This test is not yet approved or  cleared by the [place] FDA and has been authorized for detection and/or diagnosis of SARS-CoV-2 by FDA under an Emergency Use Authorization (EUA).  This EUA will remain in effect (meaning this test can be used) for the  duration of the COVID-19 declaration under Section 564(b)(1) of the Act, 21 U.S.C. section 360bbb-3(b)(1), unless the authorization is terminated or revoked sooner.  Performed at [facility], [address] [place], [place] [address]   MRSA PCR Screening     Status: None   Collection Time: 03/17/20 11:47 PM   Specimen: Nasal Mucosa; Nasopharyngeal  Result Value Ref Range Status   MRSA by PCR NEGATIVE NEGATIVE Final    Comment:        The GeneXpert MRSA Assay (FDA approved for NASAL specimens only), is one component of a comprehensive MRSA colonization surveillance program. It is not intended to diagnose MRSA infection nor to guide or monitor treatment for MRSA infections. Performed at [facility], [place] [address] [place], [place] [address]   Culture, Urine     Status: None   Collection Time: 03/19/20 10:43 AM   Specimen: Urine, Random  Result Value Ref Range Status   Specimen Description URINE, RANDOM  Final   Special Requests NONE  Final   Culture   Final    NO GROWTH Performed at [facility], [place] [address] [place],  [address]    Report Status 03/20/2020 FINAL  Final    Radiology Reports CT Abdomen Pelvis Wo Contrast  Result Date: 03/17/2020 CLINICAL DATA:  Vomiting for 1 week, low sella diam, elevated BUN and creatinine EXAM: CT ABDOMEN AND PELVIS WITHOUT CONTRAST TECHNIQUE: Multidetector CT imaging of the abdomen and pelvis was performed following the standard protocol without IV contrast. COMPARISON:  Chest radiograph 12/26/2014, lumbar radiograph 08/13/2012, MR lumbar spine  06/16/2012 FINDINGS: Lower chest: Scarring and reticular changes noted in the lung bases, left greater than right. No consolidative opacity or pleural effusion. Normal heart size. No pericardial effusion. Tiny benign appearing macrocalcification versus fiducial in the right breast. Hepatobiliary: No visible focal liver lesions. Smooth liver surface contour. Normal  hepatic attenuation. Gallbladder is moderately distended. Some focal thickening is noted towards the gallbladder fundus, possible adenomyomatosis. A partially calcified gallstone noted layering dependently as well. No pericholecystic fluid or inflammation. Normal caliber biliary tree without visible calcified intraductal gallstones. Pancreas: Moderate pancreatic atrophy. No inflammation or ductal dilatation. No discernible pancreatic lesions. Spleen: Normal in size without focal abnormality. Adrenals/Urinary Tract: No concerning adrenal lesions. No visible or contour deforming renal lesions. No urolithiasis or hydronephrosis. Urinary bladder is moderately distended with layering air-fluid level. No significant pericholecystic inflammation or urinary bladder wall thickening. Stomach/Bowel: Question some mild thickening versus underdistention of the stomach. Duodenum takes a normal course across the midline abdomen. No small bowel thickening or dilatation. Large volume of inspissated fecal material noted throughout the colon. Question some mild distal colonic thickening versus underdistention of the sigmoid. No adjacent inflammation, free fluid or evidence of perforation. No high-grade bowel obstruction. Vascular/Lymphatic: Atherosclerotic calcifications throughout the abdominal aorta and branch vessels. No aneurysm or ectasia. No enlarged abdominopelvic lymph nodes. Reproductive: Uterus is surgically absent. No concerning adnexal lesions. Other: No abdominopelvic free fluid or free gas. No bowel containing hernias. Small fat containing umbilical hernia. Musculoskeletal: Prior L3-L5 posterior spinal fusion. No evidence of hardware failure or acute complication is seen. Mild anterior wedging and Schmorl's node at the L1 superior endplate is similar to comparison radiographs. Diffuse discogenic and facet degenerative changes noted throughout the lumbar spine at both instrumented in non instrumented levels. Additional  degenerative changes in the hips and pelvis. The osseous structures appear diffusely demineralized which may limit detection of small or nondisplaced fractures. No acute or worrisome osseous lesions. IMPRESSION: 1. Large volume of inspissated fecal material noted throughout the colon. Mild distal colonic thickening versus underdistention of the sigmoid without evidence of inflammation. Correlate for features of constipation. Furthermore, could consider correlation with most recent colonoscopy or outpatient visualization if not recently performed. 2. Similar thickening versus underdistention of the stomach, could reflect gastritis or secondary change given patient's prolonged emesis. 3. Moderately distended urinary bladder with layering air-fluid level. Could reflect recent instrumentation/catheterization, however if urinary symptoms are present, recommend further evaluation with urinalysis to exclude cystitis. 4. Cholelithiasis without evidence of acute cholecystitis. Mild focal thickening at the gallbladder fundus is suspected to reflect adenomyomatosis but could correlate with outpatient right upper quadrant ultrasound. Could perform on acute basis if biliary symptoms are present. 5. Aortic Atherosclerosis (ICD10-I70.0). Electronically Signed   By: Lovena Le M.D.   On: 03/17/2020 19:37   X-ray chest PA and lateral  Result Date: 03/18/2020 CLINICAL DATA:  Acute renal insufficiency EXAM: CHEST - 2 VIEW COMPARISON:  December 26, 2014 FINDINGS: No pneumothorax. The cardiomediastinal silhouette is normal. No pulmonary nodules or masses. Mild bibasilar opacities, likely atelectasis. No overt edema. IMPRESSION: Probable mild bibasilar atelectasis. Recommend follow-up to resolution. No other acute abnormalities. Electronically Signed   By: Dorise Bullion III M.D   On: 03/18/2020 08:56   IR Fluoro Guide CV Line Right  Result Date: 03/20/2020 INDICATION: 74 year old female referred for hemodialysis catheter  placement EXAM: IMAGE GUIDED PLACEMENT OF TUNNELED HEMODIALYSIS CATHETER MEDICATIONS: 2 g Ancef. The antibiotic was given in an appropriate time interval prior to skin puncture. ANESTHESIA/SEDATION: Moderate (  conscious) sedation was employed during this procedure. A total of Versed 0.5 mg and Fentanyl 12.5 mcg was administered intravenously. Moderate Sedation Time: 19 minutes. The patient's level of consciousness and vital signs were monitored continuously by radiology nursing throughout the procedure under my direct supervision. FLUOROSCOPY TIME:  Fluoroscopy Time: 0 minutes 36 seconds (1 mGy). COMPLICATIONS: None PROCEDURE: Informed written consent was obtained from the patient after a discussion of the risks, benefits, and alternatives to treatment. Questions regarding the procedure were encouraged and answered. The right neck and chest were prepped with chlorhexidine in a sterile fashion, and a sterile drape was applied covering the operative field. Maximum barrier sterile technique with sterile gowns and gloves were used for the procedure. A timeout was performed prior to the initiation of the procedure. Ultrasound survey was performed. Micropuncture kit was utilized to access the right internal jugular vein under direct, real-time ultrasound guidance after the overlying soft tissues were anesthetized with 1% lidocaine with epinephrine. Stab incision was made with 11 blade scalpel. Microwire was passed centrally. The microwire was then marked to measure appropriate internal catheter length. External tunneled length was estimated. A total tip to cuff length of 19 cm was selected. 035 guidewire was advanced to the level of the IVC. Skin and subcutaneous tissues of chest wall below the clavicle were generously infiltrated with 1% lidocaine for local anesthesia. A small stab incision was made with 11 blade scalpel. The selected hemodialysis catheter was tunneled in a retrograde fashion from the anterior chest wall  to the venotomy incision. Serial dilation was performed and then a peel-away sheath was placed. The catheter was then placed through the peel-away sheath with tips ultimately positioned within the superior aspect of the right atrium. Final catheter positioning was confirmed and documented with a spot radiographic image. The catheter aspirates and flushes normally. The catheter was flushed with appropriate volume heparin dwells. The catheter exit site was secured with a 0-Prolene retention suture. Gel-Foam slurry was infused into the soft tissue tract. The venotomy incision was closed Derma bond and sterile dressing. Dressings were applied at the chest wall. Patient tolerated the procedure well and remained hemodynamically stable throughout. No complications were encountered and no significant blood loss encountered. IMPRESSION: Status post right IJ tunneled hemodialysis catheter placement. Catheter ready for use. Signed, Dulcy Fanny. Dellia Nims, RPVI [facility] Radiology Electronically Signed   By: Corrie Mckusick D.O.   On: 03/20/2020 15:27   IR US Guide Vasc Access Right  Result Date: 03/20/2020 INDICATION: 74 year old female referred for hemodialysis catheter placement EXAM: IMAGE GUIDED PLACEMENT OF TUNNELED HEMODIALYSIS CATHETER MEDICATIONS: 2 g Ancef. The antibiotic was given in an appropriate time interval prior to skin puncture. ANESTHESIA/SEDATION: Moderate (conscious) sedation was employed during this procedure. A total of Versed 0.5 mg and Fentanyl 12.5 mcg was administered intravenously. Moderate Sedation Time: 19 minutes. The patient's level of consciousness and vital signs were monitored continuously by radiology nursing throughout the procedure under my direct supervision. FLUOROSCOPY TIME:  Fluoroscopy Time: 0 minutes 36 seconds (1 mGy). COMPLICATIONS: None PROCEDURE: Informed written consent was obtained from the patient after a discussion of the  risks, benefits, and alternatives to treatment. Questions regarding the procedure were encouraged and answered. The right neck and chest were prepped with chlorhexidine in a sterile fashion, and a sterile drape was applied covering the operative field. Maximum barrier sterile technique with sterile gowns and gloves were used for the procedure. A timeout was performed prior to the  initiation of the procedure. Ultrasound survey was performed. Micropuncture kit was utilized to access the right internal jugular vein under direct, real-time ultrasound guidance after the overlying soft tissues were anesthetized with 1% lidocaine with epinephrine. Stab incision was made with 11 blade scalpel. Microwire was passed centrally. The microwire was then marked to measure appropriate internal catheter length. External tunneled length was estimated. A total tip to cuff length of 19 cm was selected. 035 guidewire was advanced to the level of the IVC. Skin and subcutaneous tissues of chest wall below the clavicle were generously infiltrated with 1% lidocaine for local anesthesia. A small stab incision was made with 11 blade scalpel. The selected hemodialysis catheter was tunneled in a retrograde fashion from the anterior chest wall to the venotomy incision. Serial dilation was performed and then a peel-away sheath was placed. The catheter was then placed through the peel-away sheath with tips ultimately positioned within the superior aspect of the right atrium. Final catheter positioning was confirmed and documented with a spot radiographic image. The catheter aspirates and flushes normally. The catheter was flushed with appropriate volume heparin dwells. The catheter exit site was secured with a 0-Prolene retention suture. Gel-Foam slurry was infused into the soft tissue tract. The venotomy incision was closed Derma bond and sterile dressing. Dressings were applied at the chest wall. Patient tolerated the procedure well and remained  hemodynamically stable throughout. No complications were encountered and no significant blood loss encountered. IMPRESSION: Status post right IJ tunneled hemodialysis catheter placement. Catheter ready for use. Signed, Dulcy Fanny. Dellia Nims, RPVI [facility] Radiology Electronically Signed   By: Corrie Mckusick D.O.   On: 03/20/2020 15:27   DG Chest Port 1 View  Result Date: 03/19/2020 CLINICAL DATA:  Shortness of breath. EXAM: PORTABLE CHEST 1 VIEW COMPARISON:  03/18/2020 FINDINGS: The cardiac silhouette, mediastinal and hilar contours are within normal limits and stable. Stable underlying emphysematous changes and pulmonary scarring. Suspect streaky bibasilar infiltrates. No pleural effusions. No worrisome pulmonary lesions. IMPRESSION: 1. Underlying emphysematous changes and pulmonary scarring. 2. Suspect streaky bibasilar infiltrates. Electronically Signed   By: Marijo Sanes M.D.   On: 03/19/2020 07:59   US Abdomen Limited RUQ  Result Date: 03/18/2020 CLINICAL DATA:  Nausea and vomiting EXAM: ULTRASOUND ABDOMEN LIMITED RIGHT UPPER QUADRANT COMPARISON:  CT abdomen and pelvis 03/17/2020 FINDINGS: Gallbladder: Large shadowing calculus within gallbladder 19 mm diameter. No gallbladder wall thickening, pericholecystic fluid or sonographic Murphy sign. Common bile duct: Diameter: 3 mm, normal Liver: Normal appearance. No mass or nodularity. Portal vein is patent on color Doppler imaging with normal direction of blood flow towards the liver. Other: Trace perihepatic free fluid. IMPRESSION: Cholelithiasis without evidence of cholecystitis. Trace ascites adjacent to liver. Electronically Signed   By: Lavonia Dana M.D.   On: 03/18/2020 13:32    Time Spent in minutes  30   Phillips Climes M.D on 03/21/2020 at 2:14 PM  To page go to [email or web address] - password [email or web address]

## 2020-03-21 NOTE — Consult Note (Signed)
Physical Medicine and Rehabilitation Consult Reason for Consult: Debility/multimedical Referring Physician: [facility]ad   HPI: Jasmine White is a 74 y.o. right-handed female with history of chronic back pain, hypertension, recently diagnosed CKD awaiting follow-up with renal service, hyperlipidemia, history of Covid infection recovery with persistent fatigue, dry cough and lower extremity weakness who presented 03/18/2020 with increasing vomiting and limited p.o. intake.  Denied any abdominal pain.  Per chart review patient lives with spouse.  Independent with assistive device.  Two-level home bed and bath on main level with ramped entrance.  Spouse is in a wheelchair due to RA.  CT of the abdomen showed large volume of fecal material noted throughout the colon.  Mild distal colonic thickening versus under distention of the sigmoid without evidence of inflammation.  Cholelithiasis without evidence of acute cholecystitis.  Ultrasound of the abdomen trace ascites adjacent to the liver.  Admission chemistries with sodium 112, potassium 3.4, chloride 73, BUN 116, creatinine 7.25, WBC 12,400, hemoglobin 8.8, SARS coronavirus negative.  Patient did undergo placement of a right IJ approach tunneled HD catheter and hemodialysis initiated.  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  Noted ANA also positive and anti-dsDNA elevated.  Hep C nonreactive.  Patient was placed on Solu-Medrol 1 g daily x3 days transition to oral prednisone.  Awaiting plan for renal biopsy.  Findings of UTI placed on Rocephin.  Her hyponatremia continues to improve with latest sodium 128 creatinine 6.82.  Therapy evaluations completed with recommendations of physical medicine rehab consult.   Review of Systems  Constitutional: Positive for malaise/fatigue. Negative for chills and fever.  HENT: Negative for hearing loss.   Eyes: Negative for blurred vision and double vision.   Respiratory: Positive for cough and shortness of breath.   Cardiovascular: Positive for leg swelling. Negative for chest pain and palpitations.  Gastrointestinal: Positive for constipation. Negative for heartburn, nausea and vomiting.  Genitourinary: Positive for frequency. Negative for dysuria, flank pain and hematuria.  Musculoskeletal: Positive for back pain, joint pain and myalgias.  Skin: Negative for rash.  Psychiatric/Behavioral: The patient has insomnia.   All other systems reviewed and are negative.  Past Medical History:  Diagnosis Date  . Arthritis   . Bruises easily   . Cancer ([place])    basal skin cancer  . Chronic back pain    HNP/stenosis and radiculopathy  . Hyperlipidemia    takes Pravastatin daily  . Hypertension    takes Hyzaar and toprol daily  . Hypothyroidism    takes Synthroid daily  . Insomnia    takes Elevil nightly  . Joint pain   . Joint swelling   . Low BP    past some sedation  . Nocturia   . PONV (postoperative nausea and vomiting)    with knee replacement b/p dropped  . Sinusitis    finished zpak yesterday  . Urinary frequency    Past Surgical History:  Procedure Laterality Date  . ABDOMINAL HYSTERECTOMY    . BACK SURGERY    . basal cell skin cancer     on face and forehead  . bil knee replacements    . BREAST BIOPSY     left breast/benign  . COLONOSCOPY    . IR FLUORO GUIDE CV LINE RIGHT  03/20/2020  . IR US GUIDE VASC ACCESS RIGHT  03/20/2020  . LUMBAR LAMINECTOMY/DECOMPRESSION MICRODISCECTOMY  08/13/2012   Procedure: LUMBAR LAMINECTOMY/DECOMPRESSION MICRODISCECTOMY 1 LEVEL;  Surgeon: Erline Levine, MD;  Location: [facility];  Service: Neurosurgery;  Laterality: Left;  Left lumbar one-two Microdiskectomy   Family History  Problem Relation Age of Onset  . Hypertension Mother   . Heart disease Mother   . Stroke Father   . Heart disease Father   . Alcohol abuse Father   . Arthritis Brother   . Cancer Brother 26       prostate    . Arthritis Brother   . Colon cancer Neg Hx   . Colon polyps Neg Hx   . Stomach cancer Neg Hx   . Rectal cancer Neg Hx    Social History:  reports that she has never smoked. She has never used smokeless tobacco. She reports that she does not drink alcohol and does not use drugs. Allergies:  Allergies  Allergen Reactions  . Hctz [Hydrochlorothiazide] Other (See Comments)    Hx of severe Hyponatremia while taking HCTZ - now contraindicated  . Percocet [Oxycodone-Acetaminophen] Nausea Only  . Ultram [Tramadol]     Makes her crazy and nauseated   Medications Prior to Admission  Medication Sig Dispense Refill  . amitriptyline (ELAVIL) 50 MG tablet Take 1 tablet (50 mg total) by mouth at bedtime. 90 tablet 1  . Coenzyme Q10-Vitamin E (QUNOL ULTRA COQ10 PO) Take 1 capsule by mouth daily.    Marland Kitchen GARLIC PO Take 1 capsule by mouth daily.     Marland Kitchen HYDROcodone-acetaminophen (NORCO/VICODIN) 5-325 MG tablet Take 1 tablet by mouth every 6 (six) hours as needed for moderate pain. 40 tablet 0  . losartan-hydrochlorothiazide (HYZAAR) 100-12.5 MG tablet Take 1 tablet by mouth daily. 90 tablet 1  . LUTEIN PO Take 1 capsule by mouth daily.     . metoprolol succinate (TOPROL-XL) 100 MG 24 hr tablet Take 1 tablet (100 mg total) by mouth daily. Take with or immediately following a meal. 90 tablet 1  . Multiple Vitamin (MULTIVITAMIN WITH MINERALS) TABS tablet Take 1 tablet by mouth daily.    . Omega-3 Fatty Acids (FISH OIL PO) Take 1 capsule by mouth daily.     Marland Kitchen SYNTHROID 88 MCG tablet TAKE 1 TABLET BY MOUTH ONCE DAILY BEFORE BREAKFAST (Patient taking differently: Take 88 mcg by mouth daily before breakfast. ) 90 tablet 1    Home: Home Living Family/patient expects to be discharged to:: Private residence Living Arrangements: Spouse/significant other Available Help at Discharge: Family, Other (Comment) Type of Home: House Home Access: Ramped entrance Home Layout: Two level, Able to live on main level with  bedroom/bathroom Bathroom Shower/Tub: Holiday representative Accessibility: Yes Home Equipment: Grab bars - tub/shower, Shower seat - built in, Environmental consultant - 2 wheels, Wheelchair - manual  Functional History: Prior Function Level of Independence: Independent with assistive device(s) Comments: Uses RW sometimes Functional Status:  Mobility: Bed Mobility Overal bed mobility: Needs Assistance Bed Mobility: Supine to Sit Supine to sit: Supervision Sit to supine: Modified independent (Device/Increase time) General bed mobility comments: supervision due to mild dizziness Transfers Overall transfer level: Needs assistance Equipment used: Rolling walker (2 wheeled) Transfers: Sit to/from Stand Sit to Stand: Min guard General transfer comment: vc for hand placement from bed  Ambulation/Gait Ambulation/Gait assistance: Min guard Gait Distance (Feet): 40 Feet Assistive device: Rolling walker (2 wheeled) Gait Pattern/deviations: Step-through pattern, Decreased stride length, Trunk flexed General Gait Details: vc for upright posture    ADL: ADL Overall ADL's : Needs assistance/impaired Eating/Feeding: Independent, Sitting Grooming: Min guard, Cueing for sequencing, Standing Upper Body  Bathing: Min guard, Standing Lower Body Bathing: Minimal assistance, Sit to/from stand Upper Body Dressing : Min guard, Standing Lower Body Dressing: Minimal assistance, Sit to/from stand Toilet Transfer: Min guard, RW, Regular Toilet Functional mobility during ADLs: Min guard, Rolling walker  Cognition: Cognition Overall Cognitive Status: Within Functional Limits for tasks assessed Orientation Level: Oriented X4 Cognition Arousal/Alertness: Awake/alert Behavior During Therapy: WFL for tasks assessed/performed Overall Cognitive Status: Within Functional Limits for tasks assessed General Comments: affect brighter and more briskly following commands  Blood pressure (!) 147/72, pulse 85, temperature 98.1  F (36.7 C), temperature source Oral, resp. rate 12, height '5\' 6"'  (1.676 m), weight 65.4 kg, SpO2 97 %.  Physical Exam General: Alert and oriented x 3, No apparent distress. In dialysis HEENT: Head is normocephalic, atraumatic, PERRLA, EOMI, sclera anicteric, oral mucosa pink and moist, dentition intact, ext ear canals clear,  Neck: Supple without JVD or lymphadenopathy Heart: Reg rate and rhythm. No murmurs rubs or gallops Chest: CTA bilaterally without wheezes, rales, or rhonchi; no distress Abdomen: Soft, non-tender, non-distended, bowel sounds positive. Extremities: No clubbing, cyanosis, or edema. Pulses are 2+ Skin: Clean and intact without signs of breakdown Neuro: Patient is alert in no acute distress.  Follows full commands.  Elias-Fela Solis. 5/5 strength throughout.  Psych: Pt's affect is appropriate. Pt is cooperative   Results for orders placed or performed during the hospital encounter of 03/17/20 (from the past 24 hour(s))  Brain natriuretic peptide     Status: Abnormal   Collection Time: 03/21/20  5:09 AM  Result Value Ref Range   B Natriuretic Peptide 2,263.4 (H) 0.0 - 100.0 pg/mL  Magnesium     Status: Abnormal   Collection Time: 03/21/20  5:09 AM  Result Value Ref Range   Magnesium 2.6 (H) 1.7 - 2.4 mg/dL  D-dimer, quantitative (not at [facility])     Status: Abnormal   Collection Time: 03/21/20  5:09 AM  Result Value Ref Range   D-Dimer, Quant 7.21 (H) 0.00 - 0.50 ug/mL-FEU  C-reactive protein     Status: Abnormal   Collection Time: 03/21/20  5:09 AM  Result Value Ref Range   CRP 19.2 (H) <1.0 mg/dL  Comprehensive metabolic panel     Status: Abnormal   Collection Time: 03/21/20  5:09 AM  Result Value Ref Range   Sodium 128 (L) 135 - 145 mmol/L   Potassium 3.8 3.5 - 5.1 mmol/L   Chloride 93 (L) 98 - 111 mmol/L   CO2 18 (L) 22 - 32 mmol/L   Glucose, Bld 170 (H) 70 - 99 mg/dL   BUN 74 (H) 8 - 23 mg/dL   Creatinine, Ser 6.82 (H) 0.44 - 1.00 mg/dL   Calcium  8.5 (L) 8.9 - 10.3 mg/dL   Total Protein 6.7 6.5 - 8.1 g/dL   Albumin 2.5 (L) 3.5 - 5.0 g/dL   AST 22 15 - 41 U/L   ALT 13 0 - 44 U/L   Alkaline Phosphatase 70 38 - 126 U/L   Total Bilirubin 1.0 0.3 - 1.2 mg/dL   GFR calc non Af Amer 5 (L) >60 mL/min   GFR calc Af Amer 6 (L) >60 mL/min   Anion gap 17 (H) 5 - 15  CBC with Differential/Platelet     Status: Abnormal   Collection Time: 03/21/20  5:09 AM  Result Value Ref Range   WBC 10.7 (H) 4.0 - 10.5 K/uL   RBC 3.22 (L) 3.87 - 5.11 MIL/uL   Hemoglobin 8.5 (  L) 12.0 - 15.0 g/dL   HCT 24.7 (L) 36 - 46 %   MCV 76.7 (L) 80.0 - 100.0 fL   MCH 26.4 26.0 - 34.0 pg   MCHC 34.4 30.0 - 36.0 g/dL   RDW 13.6 11.5 - 15.5 %   Platelets 289 150 - 400 K/uL   nRBC 0.0 0.0 - 0.2 %   Neutrophils Relative % 97 %   Neutro Abs 10.3 (H) 1.7 - 7.7 K/uL   Lymphocytes Relative 2 %   Lymphs Abs 0.2 (L) 0.7 - 4.0 K/uL   Monocytes Relative 0 %   Monocytes Absolute 0.0 (L) 0 - 1 K/uL   Eosinophils Relative 0 %   Eosinophils Absolute 0.0 0 - 0 K/uL   Basophils Relative 0 %   Basophils Absolute 0.0 0 - 0 K/uL   Immature Granulocytes 1 %   Abs Immature Granulocytes 0.12 (H) 0.00 - 0.07 K/uL  Phosphorus     Status: Abnormal   Collection Time: 03/21/20  5:09 AM  Result Value Ref Range   Phosphorus 7.5 (H) 2.5 - 4.6 mg/dL  Therapeutic plasma exchange (blood bank)     Status: None (Preliminary result)   Collection Time: 03/21/20 10:24 AM  Result Value Ref Range   Plasma Exchange 2956 ML ORDERED, 2959 ML THAWED    Plasma volume needed 2,956    Unit Number [id number]    Blood Component Type THAWED PLASMA    Unit division 00    Status of Unit ISSUED    Transfusion Status OK TO TRANSFUSE    Unit Number [id number]    Blood Component Type THAWED PLASMA    Unit division 00    Status of Unit ISSUED    Transfusion Status OK TO TRANSFUSE    Unit Number [id number]    Blood Component Type THW PLS APHR    Unit division B0    Status of Unit ISSUED     Transfusion Status OK TO TRANSFUSE    Unit Number [id number]    Blood Component Type THAWED PLASMA    Unit division 00    Status of Unit ISSUED    Transfusion Status OK TO TRANSFUSE    Unit Number [id number]    Blood Component Type THAWED PLASMA    Unit division 00    Status of Unit ISSUED    Transfusion Status OK TO TRANSFUSE    Unit Number [id number]    Blood Component Type THAWED PLASMA    Unit division 00    Status of Unit ISSUED    Transfusion Status OK TO TRANSFUSE    Unit Number [id number]    Blood Component Type THW PLS APHR    Unit division 00    Status of Unit ISSUED    Transfusion Status OK TO TRANSFUSE    Unit Number [id number]    Blood Component Type THAWED PLASMA    Unit division 00    Status of Unit ISSUED    Transfusion Status OK TO TRANSFUSE    Unit Number [id number]    Blood Component Type THAWED PLASMA    Unit division 00    Status of Unit ISSUED    Transfusion Status OK TO TRANSFUSE    Unit Number [id number]    Blood Component Type THAWED PLASMA    Unit division 00    Status of Unit ISSUED    Transfusion Status      OK TO TRANSFUSE Performed at [facility], [address],  [place], [place] [address]   Hemoglobin A1c     Status: Abnormal   Collection Time: 03/21/20  7:31 PM  Result Value Ref Range   Hgb A1c MFr Bld 5.9 (H) 4.8 - 5.6 %   Mean Plasma Glucose 122.63 mg/dL  Brain natriuretic peptide     Status: Abnormal   Collection Time: 03/22/20  1:03 AM  Result Value Ref Range   B Natriuretic Peptide 3,320.9 (H) 0.0 - 100.0 pg/mL  Magnesium     Status: None   Collection Time: 03/22/20  1:03 AM  Result Value Ref Range   Magnesium 2.2 1.7 - 2.4 mg/dL  D-dimer, quantitative (not at [facility])     Status: Abnormal   Collection Time: 03/22/20  1:03 AM  Result Value Ref Range   D-Dimer, Quant 3.62 (H) 0.00 - 0.50 ug/mL-FEU  C-reactive protein     Status: Abnormal   Collection Time: 03/22/20  1:03 AM  Result Value Ref Range     CRP 6.4 (H) <1.0 mg/dL  Comprehensive metabolic panel     Status: Abnormal   Collection Time: 03/22/20  1:03 AM  Result Value Ref Range   Sodium 135 135 - 145 mmol/L   Potassium 3.4 (L) 3.5 - 5.1 mmol/L   Chloride 95 (L) 98 - 111 mmol/L   CO2 28 22 - 32 mmol/L   Glucose, Bld 190 (H) 70 - 99 mg/dL   BUN 41 (H) 8 - 23 mg/dL   Creatinine, Ser 4.54 (H) 0.44 - 1.00 mg/dL   Calcium 8.8 (L) 8.9 - 10.3 mg/dL   Total Protein 6.3 (L) 6.5 - 8.1 g/dL   Albumin 3.0 (L) 3.5 - 5.0 g/dL   AST 23 15 - 41 U/L   ALT 14 0 - 44 U/L   Alkaline Phosphatase 52 38 - 126 U/L   Total Bilirubin 0.7 0.3 - 1.2 mg/dL   GFR calc non Af Amer 9 (L) >60 mL/min   GFR calc Af Amer 10 (L) >60 mL/min   Anion gap 12 5 - 15  CBC with Differential/Platelet     Status: Abnormal   Collection Time: 03/22/20  1:03 AM  Result Value Ref Range   WBC 14.0 (H) 4.0 - 10.5 K/uL   RBC 2.93 (L) 3.87 - 5.11 MIL/uL   Hemoglobin 8.0 (L) 12.0 - 15.0 g/dL   HCT 22.9 (L) 36 - 46 %   MCV 78.2 (L) 80.0 - 100.0 fL   MCH 27.3 26.0 - 34.0 pg   MCHC 34.9 30.0 - 36.0 g/dL   RDW 13.9 11.5 - 15.5 %   Platelets 281 150 - 400 K/uL   nRBC 0.0 0.0 - 0.2 %   Neutrophils Relative % 95 %   Neutro Abs 13.2 (H) 1.7 - 7.7 K/uL   Lymphocytes Relative 2 %   Lymphs Abs 0.3 (L) 0.7 - 4.0 K/uL   Monocytes Relative 2 %   Monocytes Absolute 0.3 0 - 1 K/uL   Eosinophils Relative 0 %   Eosinophils Absolute 0.0 0 - 0 K/uL   Basophils Relative 0 %   Basophils Absolute 0.0 0 - 0 K/uL   Immature Granulocytes 1 %   Abs Immature Granulocytes 0.12 (H) 0.00 - 0.07 K/uL  Phosphorus     Status: Abnormal   Collection Time: 03/22/20  1:03 AM  Result Value Ref Range   Phosphorus 5.3 (H) 2.5 - 4.6 mg/dL  Protime-INR     Status: None   Collection Time: 03/22/20  1:03 AM  Result Value Ref Range   Prothrombin Time 14.3 11.4 - 15.2 seconds   INR 1.2 0.8 - 1.2   IR Fluoro Guide CV Line Right  Result Date: 03/20/2020 INDICATION: 74 year old female referred for  hemodialysis catheter placement EXAM: IMAGE GUIDED PLACEMENT OF TUNNELED HEMODIALYSIS CATHETER MEDICATIONS: 2 g Ancef. The antibiotic was given in an appropriate time interval prior to skin puncture. ANESTHESIA/SEDATION: Moderate (conscious) sedation was employed during this procedure. A total of Versed 0.5 mg and Fentanyl 12.5 mcg was administered intravenously. Moderate Sedation Time: 19 minutes. The patient's level of consciousness and vital signs were monitored continuously by radiology nursing throughout the procedure under my direct supervision. FLUOROSCOPY TIME:  Fluoroscopy Time: 0 minutes 36 seconds (1 mGy). COMPLICATIONS: None PROCEDURE: Informed written consent was obtained from the patient after a discussion of the risks, benefits, and alternatives to treatment. Questions regarding the procedure were encouraged and answered. The right neck and chest were prepped with chlorhexidine in a sterile fashion, and a sterile drape was applied covering the operative field. Maximum barrier sterile technique with sterile gowns and gloves were used for the procedure. A timeout was performed prior to the initiation of the procedure. Ultrasound survey was performed. Micropuncture kit was utilized to access the right internal jugular vein under direct, real-time ultrasound guidance after the overlying soft tissues were anesthetized with 1% lidocaine with epinephrine. Stab incision was made with 11 blade scalpel. Microwire was passed centrally. The microwire was then marked to measure appropriate internal catheter length. External tunneled length was estimated. A total tip to cuff length of 19 cm was selected. 035 guidewire was advanced to the level of the IVC. Skin and subcutaneous tissues of chest wall below the clavicle were generously infiltrated with 1% lidocaine for local anesthesia. A small stab incision was made with 11 blade scalpel. The selected hemodialysis catheter was tunneled in a retrograde fashion from  the anterior chest wall to the venotomy incision. Serial dilation was performed and then a peel-away sheath was placed. The catheter was then placed through the peel-away sheath with tips ultimately positioned within the superior aspect of the right atrium. Final catheter positioning was confirmed and documented with a spot radiographic image. The catheter aspirates and flushes normally. The catheter was flushed with appropriate volume heparin dwells. The catheter exit site was secured with a 0-Prolene retention suture. Gel-Foam slurry was infused into the soft tissue tract. The venotomy incision was closed Derma bond and sterile dressing. Dressings were applied at the chest wall. Patient tolerated the procedure well and remained hemodynamically stable throughout. No complications were encountered and no significant blood loss encountered. IMPRESSION: Status post right IJ tunneled hemodialysis catheter placement. Catheter ready for use. Signed, Dulcy Fanny. Dellia Nims, RPVI [facility] Radiology Electronically Signed   By: Corrie Mckusick D.O.   On: 03/20/2020 15:27   IR US Guide Vasc Access Right  Result Date: 03/20/2020 INDICATION: 74 year old female referred for hemodialysis catheter placement EXAM: IMAGE GUIDED PLACEMENT OF TUNNELED HEMODIALYSIS CATHETER MEDICATIONS: 2 g Ancef. The antibiotic was given in an appropriate time interval prior to skin puncture. ANESTHESIA/SEDATION: Moderate (conscious) sedation was employed during this procedure. A total of Versed 0.5 mg and Fentanyl 12.5 mcg was administered intravenously. Moderate Sedation Time: 19 minutes. The patient's level of consciousness and vital signs were monitored continuously by radiology nursing throughout the procedure under my direct supervision. FLUOROSCOPY TIME:  Fluoroscopy Time: 0 minutes 36 seconds (1 mGy). COMPLICATIONS: None PROCEDURE: Informed written consent was  obtained from the patient after a  discussion of the risks, benefits, and alternatives to treatment. Questions regarding the procedure were encouraged and answered. The right neck and chest were prepped with chlorhexidine in a sterile fashion, and a sterile drape was applied covering the operative field. Maximum barrier sterile technique with sterile gowns and gloves were used for the procedure. A timeout was performed prior to the initiation of the procedure. Ultrasound survey was performed. Micropuncture kit was utilized to access the right internal jugular vein under direct, real-time ultrasound guidance after the overlying soft tissues were anesthetized with 1% lidocaine with epinephrine. Stab incision was made with 11 blade scalpel. Microwire was passed centrally. The microwire was then marked to measure appropriate internal catheter length. External tunneled length was estimated. A total tip to cuff length of 19 cm was selected. 035 guidewire was advanced to the level of the IVC. Skin and subcutaneous tissues of chest wall below the clavicle were generously infiltrated with 1% lidocaine for local anesthesia. A small stab incision was made with 11 blade scalpel. The selected hemodialysis catheter was tunneled in a retrograde fashion from the anterior chest wall to the venotomy incision. Serial dilation was performed and then a peel-away sheath was placed. The catheter was then placed through the peel-away sheath with tips ultimately positioned within the superior aspect of the right atrium. Final catheter positioning was confirmed and documented with a spot radiographic image. The catheter aspirates and flushes normally. The catheter was flushed with appropriate volume heparin dwells. The catheter exit site was secured with a 0-Prolene retention suture. Gel-Foam slurry was infused into the soft tissue tract. The venotomy incision was closed Derma bond and sterile dressing. Dressings were applied at the chest wall. Patient tolerated the procedure  well and remained hemodynamically stable throughout. No complications were encountered and no significant blood loss encountered. IMPRESSION: Status post right IJ tunneled hemodialysis catheter placement. Catheter ready for use. Signed, Dulcy Fanny. Dellia Nims, RPVI [facility] Radiology Electronically Signed   By: Corrie Mckusick D.O.   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 Does the need for close, 24 hr/day medical supervision in concert with the patient's rehab needs make it unreasonable for this patient to be served in a less intensive setting? Yes 2. Co-Morbidities requiring supervision/potential complications: severe hypovolemic hyponatremia, CKD on hemodialysis, chronic back pain, HLD, depression, proteinuria, constipation, metabolic acidosis, HTN, hyperphosphatemia, microcytic anemia 3. Due to bladder management, bowel management, safety, skin/wound care, disease management, medication administration, pain management and patient education, does the patient require 24 hr/day rehab nursing? Yes 4. Does the patient require coordinated care of a physician, rehab nurse, therapy disciplines of PT, OT to address physical and functional deficits in the context of the above medical diagnosis(es)? Yes Addressing deficits in the following areas: balance, endurance, locomotion, strength, transferring, bowel/bladder control, bathing, dressing, feeding, grooming, toileting and psychosocial support 5. Can the patient actively participate in an intensive therapy program of at least 3 hrs of therapy per day at least 5 days per week? Yes 6. The potential for patient to make measurable gains while on inpatient rehab is excellent 7. Anticipated functional outcomes upon discharge from inpatient rehab are modified independent  with PT, modified independent with OT, independent with  SLP. 8. Estimated rehab length of stay to reach the above functional goals is: 5-7 days 9. Anticipated discharge destination: Home 10. Overall Rehab/Functional Prognosis: excellent  RECOMMENDATIONS: This patient's condition is appropriate for continued rehabilitative care in the following setting: CIR Patient has agreed to participate in recommended program. Yes Note that insurance prior authorization may be required for reimbursement for recommended care.  Comment: Mrs. Kiester would be an excellent CIR candidate. She lives with her husband and is independent at baseline. Her daughter is at bedside and works full time but says additional family members will be able to support patient on discharge as her husband is getting a knee replacement next week. She has had debilitating fatigue since infection with COVID-19 in November. Prior to the pandemic, she used to exercise 5 days per week.  Thank you for this consult. We will continue to follow in Mrs. Perillo' care.  Lavon Paganini Angiulli, PA-C 03/22/2020   I have personally performed a face to face diagnostic evaluation, including, but not limited to relevant history and physical exam findings, of this patient and developed relevant assessment and plan.  Additionally, I have reviewed and concur with the physician assistant's documentation above.  Leeroy Cha, MD

## 2020-03-21 NOTE — Progress Notes (Addendum)
[facility] Progress Note  Name: Jasmine White MRN: [id number] DOB: 1946/07/13  Chief Complaint:  n/v  Subjective:  Had first HD on 6/29 after tunneled HD catheter placed with IR.  She had 400 mL uop over 6/29 documented.  She feels much better today than yesterday.  We discussed HD and pheresis today and recommendation for renal biopsy tomorrow (she has consented for these procedures - see prior plan of care note).    Review of systems:    Denies nausea  States had a coke  Denies any shortness of breath  denies chest pain  States feels much stronger Constipation better -------------  Background on consult:  HPI: Pt is a 78F with a PMH sig for HTN, HLD, hypothyroidism, COVID in November who is now seen in consultation at the request of Dr. Candiss Norse for eval and recs re: AKI, hyponatremia.  Pt has had COVID in November and since then hasn't felt right.  Having burning every night in her legs for which she was taking Advil nightly.  Takes hyzaar for HTN.  Previously Cr was 0.78 in Dec 2020.  Went to PCP 03/01/20 and was 2.34 then.  Has had one week of n/v.  Has still been taking her Advil and her hyzaar.  Went to [facility] last night where she was found to have Cr of > 7, BUN 119, and severe hyponatremia at 109. CT scan showed large amount of inspissated stool and possibly thickened sigmoid colon and stomach/ GB.  Was given 1L NS, started on 50 mL/ hr NS, and transferred to [facility] for admission.  Pt states she's very thirsty right now.  No dizziness/ falls/ seizure like activity.  She has been hypertensive here.  Urine lytes show isosthenuria with urine Na 24.  (? If before or after IV NS bolus).  Na 112 this AM, no real movement in BUN/ Cr.  No HA, f/c, SOB, rashes, nosebleeds, red/ itchy eyes, nasal crusting, coughing up blood, blood in urine/ stool.  Says some foamy urine.    Intake/Output Summary (Last 24 hours) at 03/21/2020 0700 Last data filed at 03/21/2020 0555 Gross per 24  hour  Intake 655.35 ml  Output 400 ml  Net 255.35 ml    Vitals:  Vitals:   03/20/20 2244 03/20/20 2356 03/21/20 0400 03/21/20 0651  BP: (!) 168/85 (!) 144/104 (!) 165/93   Pulse: 87 93 88   Resp: 17 17 (!) 23   Temp: 98.6 F (37 C) 98.3 F (36.8 C) 98.1 F (36.7 C)   TempSrc: Oral Oral Oral   SpO2: 99% 100% 99%   Weight: 63.5 kg   69.4 kg  Height:         Physical Exam:   General adult female in bed in no acute distress HEENT normocephalic atraumatic extraocular movements intact sclera anicteric Neck supple trachea midline Lungs crackles on auscultation bilaterally normal work of breathing at rest; on 3 liters oxygen Heart S1S2 no rub Abdomen soft nontender nondistended Extremities no pitting edema appreciated Psych normal mood and affect Neuro alert and oriented x 3 provides hx and follows commands  GU - foley catheter in place with urine Access: RIJ tunneled HD catheter in place   Medications reviewed   Labs:  BMP Latest Ref Rng & Units 03/21/2020 03/20/2020 03/20/2020  Glucose 70 - 99 mg/dL 170(H) 100(H) -  BUN 8 - 23 mg/dL 74(H) 123(H) -  Creatinine 0.44 - 1.00 mg/dL 6.82(H) 9.38(H) -  BUN/Creat Ratio 12 -  28 - - -  Sodium 135 - 145 mmol/L 128(L) 121(L) 119(LL)  Potassium 3.5 - 5.1 mmol/L 3.8 4.0 -  Chloride 98 - 111 mmol/L 93(L) 85(L) -  CO2 22 - 32 mmol/L 18(L) 16(L) -  Calcium 8.9 - 10.3 mg/dL 8.5(L) 8.1(L) -     Assessment/Plan:   # AKI 2/2 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  Had Cr of 2.3 two weeks prior to presentation then rose to > 7 - 8 in the setting of 1 week of n/v, Advil, and Hyzaar. Work-up has included: Anti-GBM elevated at 137, MPO elevated at 10.3.  Note ANA also positive and anti-dsDNA elevated.   up/c ratio 7430 mg/g.  Hep C nonreactive, hep B negative, HIV nonreactive.  CT with mod distended urinary bladder, no hydro.  PR3 neg. Complement normal. SPEP no M spike. free light chains  ratio normal. ASO negative.  HD started on 6/29 after tunneled catheter with IR -------- - HD today (6/30) and ressess needs daily  - Solumedrol 1 gram daily x 3 days (started on 6/29) then will transition to oral pred - Plan for Pheresis for 6/30 - discussed with HD charge RN.  FFP.  Will plan for every other day x 7 treatments.  - She needs a renal biopsy - will consult IR for biopsy for 7/1  - Continue foley catheter for now   - paused Mak[place] heparin - added scd's  # Severe hypovolemic hyponatremia: in setting of n/v and HCTZ use as well as profound AKI.  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  Cautious correction to avoid adverse outcome.   - improving - defer D5 infusion - follow for need - hypothyroidism per primary team    # Proteinuria  - up/c ratio 7430 mg/g with concurrent hematuria - work-up as above is requested - needs up/cr ratio sent - I have reordered   # Metabolic acidosis - 2/2 AKI  - managed with HD   # Constipation - improving; please avoid all Mg containing laxatives/ enemas and fleets enemas.  Per primary   # HTN: holding her home hyzaar with AKI.  Increased hydralazine to 50 mg TID on 6/29; UF today gently with HD   # Hyperphosphatemia - renal diet and renvela when taking PO.  Check phos daily    # Possible UTI- no growth on cx (appears obtained on ceftriaxone).  Defer abx to primary team  # Abnormal CT scan- RUQ [place] with cholelithiasis without evidence of cholecystitis   # Microcytic anemia   - iron, B12, and folate acceptable  - anticipate need for ESA   # Dispo: continue inpatient monitoring   Claudia Desanctis, MD 03/21/2020  7:31 AM  Addendum  Noted D dimer was checked and elevated. DVT ppx per primary team - Discussed result with them and they are getting dopplers.    Claudia Desanctis, MD 03/21/2020 4:37 PM

## 2020-03-21 NOTE — Procedures (Signed)
Seen and examined on pheresis.  Procedure supervised and she is tolerating well.  Blood pressure 144/67 and HR 89.  RIJ tunneled catheter in use.      Claudia Desanctis, MD 03/21/2020 2:07 PM

## 2020-03-21 NOTE — Progress Notes (Signed)
Referring Physician(s): Dr. Royce Macadamia, Nephrology  Supervising Physician: Sandi Mariscal  Patient Status:  [facility] - In-pt  Chief Complaint: Acute kidney injury  Subjective: Patient was seen in hemodialysis today. She is alert, no complaints at this time. She received a tunneled HD catheter 03/20/20 in IR by Dr. Earleen Newport. Interventional Radiology has been asked to perform a random renal biopsy tomorrow 03/22/20 for further work up and diagnosis of her acute kidney injury.   Allergies: Percocet [oxycodone-acetaminophen] and Ultram [tramadol]  Medications: Prior to Admission medications   Medication Sig Start Date End Date Taking? Authorizing Provider  amitriptyline (ELAVIL) 50 MG tablet Take 1 tablet (50 mg total) by mouth at bedtime. 09/02/19  Yes Martin, [name], FNP  Coenzyme Q10-Vitamin E (QUNOL ULTRA COQ10 PO) Take 1 capsule by mouth daily.   Yes [provider]  GARLIC PO Take 1 capsule by mouth daily.    Yes [provider]  HYDROcodone-acetaminophen (NORCO/VICODIN) 5-325 MG tablet Take 1 tablet by mouth every 6 (six) hours as needed for moderate pain. 03/01/20  Yes Martin, [name], FNP  losartan-hydrochlorothiazide (HYZAAR) 100-12.5 MG tablet Take 1 tablet by mouth daily. 03/01/20  Yes Martin, [name], FNP  LUTEIN PO Take 1 capsule by mouth daily.    Yes [provider]  metoprolol succinate (TOPROL-XL) 100 MG 24 hr tablet Take 1 tablet (100 mg total) by mouth daily. Take with or immediately following a meal. 03/01/20  Yes Hassell Done, [name], FNP  Multiple Vitamin (MULTIVITAMIN WITH MINERALS) TABS tablet Take 1 tablet by mouth daily.   Yes [provider]  Omega-3 Fatty Acids (FISH OIL PO) Take 1 capsule by mouth daily.    Yes [provider]  SYNTHROID 88 MCG tablet TAKE 1 TABLET BY MOUTH ONCE DAILY BEFORE BREAKFAST Patient taking differently: Take 88 mcg by mouth daily before breakfast.  03/01/20  Yes Hassell Done, [name], FNP       Vital Signs: BP (!) 173/87    Pulse 86    Temp 98 F (36.7 C) (Oral)    Resp 15    Ht 5' 6" (1.676 m)    Wt 153 lb (69.4 kg)    SpO2 100%    BMI 24.69 kg/m   Physical Exam Vitals reviewed.  Constitutional:      General: She is not in acute distress.    Appearance: Normal appearance.  HENT:     Mouth/Throat:     Mouth: Mucous membranes are moist.  Cardiovascular:     Rate and Rhythm: Normal rate and regular rhythm.     Comments: Right chest tunneled hemodialysis catheter. Dressing is clean and dry.  Pulmonary:     Effort: Pulmonary effort is normal.  Skin:    General: Skin is warm and dry.  Neurological:     Mental Status: She is alert and oriented to person, place, and time.     Imaging: CT Abdomen Pelvis Wo Contrast  Result Date: 03/17/2020 CLINICAL DATA:  Vomiting for 1 week, low sella diam, elevated BUN and creatinine EXAM: CT ABDOMEN AND PELVIS WITHOUT CONTRAST TECHNIQUE: Multidetector CT imaging of the abdomen and pelvis was performed following the standard protocol without IV contrast. COMPARISON:  Chest radiograph 12/26/2014, lumbar radiograph 08/13/2012, MR lumbar spine 06/16/2012 FINDINGS: Lower chest: Scarring and reticular changes noted in the lung bases, left greater than right. No consolidative opacity or pleural effusion. Normal heart size. No pericardial effusion. Tiny benign appearing macrocalcification versus fiducial in the right breast. Hepatobiliary: No visible focal liver  lesions. Smooth liver surface contour. Normal hepatic attenuation. Gallbladder is moderately distended. Some focal thickening is noted towards the gallbladder fundus, possible adenomyomatosis. A partially calcified gallstone noted layering dependently as well. No pericholecystic fluid or inflammation. Normal caliber biliary tree without visible calcified intraductal gallstones. Pancreas: Moderate pancreatic atrophy. No inflammation or ductal dilatation. No discernible pancreatic lesions.  Spleen: Normal in size without focal abnormality. Adrenals/Urinary Tract: No concerning adrenal lesions. No visible or contour deforming renal lesions. No urolithiasis or hydronephrosis. Urinary bladder is moderately distended with layering air-fluid level. No significant pericholecystic inflammation or urinary bladder wall thickening. Stomach/Bowel: Question some mild thickening versus underdistention of the stomach. Duodenum takes a normal course across the midline abdomen. No small bowel thickening or dilatation. Large volume of inspissated fecal material noted throughout the colon. Question some mild distal colonic thickening versus underdistention of the sigmoid. No adjacent inflammation, free fluid or evidence of perforation. No high-grade bowel obstruction. Vascular/Lymphatic: Atherosclerotic calcifications throughout the abdominal aorta and branch vessels. No aneurysm or ectasia. No enlarged abdominopelvic lymph nodes. Reproductive: Uterus is surgically absent. No concerning adnexal lesions. Other: No abdominopelvic free fluid or free gas. No bowel containing hernias. Small fat containing umbilical hernia. Musculoskeletal: Prior L3-L5 posterior spinal fusion. No evidence of hardware failure or acute complication is seen. Mild anterior wedging and Schmorl's node at the L1 superior endplate is similar to comparison radiographs. Diffuse discogenic and facet degenerative changes noted throughout the lumbar spine at both instrumented in non instrumented levels. Additional degenerative changes in the hips and pelvis. The osseous structures appear diffusely demineralized which may limit detection of small or nondisplaced fractures. No acute or worrisome osseous lesions. IMPRESSION: 1. Large volume of inspissated fecal material noted throughout the colon. Mild distal colonic thickening versus underdistention of the sigmoid without evidence of inflammation. Correlate for features of constipation. Furthermore, could  consider correlation with most recent colonoscopy or outpatient visualization if not recently performed. 2. Similar thickening versus underdistention of the stomach, could reflect gastritis or secondary change given patient's prolonged emesis. 3. Moderately distended urinary bladder with layering air-fluid level. Could reflect recent instrumentation/catheterization, however if urinary symptoms are present, recommend further evaluation with urinalysis to exclude cystitis. 4. Cholelithiasis without evidence of acute cholecystitis. Mild focal thickening at the gallbladder fundus is suspected to reflect adenomyomatosis but could correlate with outpatient right upper quadrant ultrasound. Could perform on acute basis if biliary symptoms are present. 5. Aortic Atherosclerosis (ICD10-I70.0). Electronically Signed   By: Lovena Le M.D.   On: 03/17/2020 19:37   X-ray chest PA and lateral  Result Date: 03/18/2020 CLINICAL DATA:  Acute renal insufficiency EXAM: CHEST - 2 VIEW COMPARISON:  December 26, 2014 FINDINGS: No pneumothorax. The cardiomediastinal silhouette is normal. No pulmonary nodules or masses. Mild bibasilar opacities, likely atelectasis. No overt edema. IMPRESSION: Probable mild bibasilar atelectasis. Recommend follow-up to resolution. No other acute abnormalities. Electronically Signed   By: Dorise Bullion III M.D   On: 03/18/2020 08:56   IR Fluoro Guide CV Line Right  Result Date: 03/20/2020 INDICATION: 74 year old female referred for hemodialysis catheter placement EXAM: IMAGE GUIDED PLACEMENT OF TUNNELED HEMODIALYSIS CATHETER MEDICATIONS: 2 g Ancef. The antibiotic was given in an appropriate time interval prior to skin puncture. ANESTHESIA/SEDATION: Moderate (conscious) sedation was employed during this procedure. A total of Versed 0.5 mg and Fentanyl 12.5 mcg was administered intravenously. Moderate Sedation Time: 19 minutes. The patient's level of consciousness and vital signs were monitored  continuously by radiology nursing throughout the procedure under my  supervision. FLUOROSCOPY TIME:  Fluoroscopy Time: 0 minutes 36 seconds (1 mGy). COMPLICATIONS: None PROCEDURE: Informed written consent was obtained from the patient after a discussion of the risks, benefits, and alternatives to treatment. Questions regarding the procedure were encouraged and answered. The right neck and chest were prepped with chlorhexidine in a sterile fashion, and a sterile drape was applied covering the operative field. Maximum barrier sterile technique with sterile gowns and gloves were used for the procedure. A timeout was performed prior to the initiation of the procedure. Ultrasound survey was performed. Micropuncture kit was utilized to access the right internal jugular vein under direct, real-time ultrasound guidance after the overlying soft tissues were anesthetized with 1% lidocaine with epinephrine. Stab incision was made with 11 blade scalpel. Microwire was passed centrally. The microwire was then marked to measure appropriate internal catheter length. External tunneled length was estimated. A total tip to cuff length of 19 cm was selected. 035 guidewire was advanced to the level of the IVC. Skin and subcutaneous tissues of chest wall below the clavicle were generously infiltrated with 1% lidocaine for local anesthesia. A small stab incision was made with 11 blade scalpel. The selected hemodialysis catheter was tunneled in a retrograde fashion from the anterior chest wall to the venotomy incision. Serial dilation was performed and then a peel-away sheath was placed. The catheter was then placed through the peel-away sheath with tips ultimately positioned within the superior aspect of the right atrium. Final catheter positioning was confirmed and documented with a spot radiographic image. The catheter aspirates and flushes normally. The catheter was flushed with appropriate volume heparin dwells. The catheter  exit site was secured with a 0-Prolene retention suture. Gel-Foam slurry was infused into the soft tissue tract. The venotomy incision was closed Derma bond and sterile dressing. Dressings were applied at the chest wall. Patient tolerated the procedure well and remained hemodynamically stable throughout. No complications were encountered and no significant blood loss encountered. IMPRESSION: Status post right IJ tunneled hemodialysis catheter placement. Catheter ready for use. Signed, Dulcy Fanny. Dellia Nims, RPVI [facility] Radiology Electronically Signed   By: Corrie Mckusick D.O.   On: 03/20/2020 15:27   IR US Guide Vasc Access Right  Result Date: 03/20/2020 INDICATION: 74 year old female referred for hemodialysis catheter placement EXAM: IMAGE GUIDED PLACEMENT OF TUNNELED HEMODIALYSIS CATHETER MEDICATIONS: 2 g Ancef. The antibiotic was given in an appropriate time interval prior to skin puncture. ANESTHESIA/SEDATION: Moderate (conscious) sedation was employed during this procedure. A total of Versed 0.5 mg and Fentanyl 12.5 mcg was administered intravenously. Moderate Sedation Time: 19 minutes. The patient's level of consciousness and vital signs were monitored continuously by radiology nursing throughout the procedure under my direct supervision. FLUOROSCOPY TIME:  Fluoroscopy Time: 0 minutes 36 seconds (1 mGy). COMPLICATIONS: None PROCEDURE: Informed written consent was obtained from the patient after a discussion of the risks, benefits, and alternatives to treatment. Questions regarding the procedure were encouraged and answered. The right neck and chest were prepped with chlorhexidine in a sterile fashion, and a sterile drape was applied covering the operative field. Maximum barrier sterile technique with sterile gowns and gloves were used for the procedure. A timeout was performed prior to the initiation of the procedure. Ultrasound survey was performed.  Micropuncture kit was utilized to access the right internal jugular vein under direct, real-time ultrasound guidance after the overlying soft tissues were anesthetized with 1% lidocaine with epinephrine. Stab incision was made with 11 blade scalpel. Microwire  was passed centrally. The microwire was then marked to measure appropriate internal catheter length. External tunneled length was estimated. A total tip to cuff length of 19 cm was selected. 035 guidewire was advanced to the level of the IVC. Skin and subcutaneous tissues of chest wall below the clavicle were generously infiltrated with 1% lidocaine for local anesthesia. A small stab incision was made with 11 blade scalpel. The selected hemodialysis catheter was tunneled in a retrograde fashion from the anterior chest wall to the venotomy incision. Serial dilation was performed and then a peel-away sheath was placed. The catheter was then placed through the peel-away sheath with tips ultimately positioned within the superior aspect of the right atrium. Final catheter positioning was confirmed and documented with a spot radiographic image. The catheter aspirates and flushes normally. The catheter was flushed with appropriate volume heparin dwells. The catheter exit site was secured with a 0-Prolene retention suture. Gel-Foam slurry was infused into the soft tissue tract. The venotomy incision was closed Derma bond and sterile dressing. Dressings were applied at the chest wall. Patient tolerated the procedure well and remained hemodynamically stable throughout. No complications were encountered and no significant blood loss encountered. IMPRESSION: Status post right IJ tunneled hemodialysis catheter placement. Catheter ready for use. Signed, Dulcy Fanny. Dellia Nims, RPVI [facility] Radiology Electronically Signed   By: Corrie Mckusick D.O.   On: 03/20/2020 15:27   DG Chest Port 1 View  Result Date:  03/19/2020 CLINICAL DATA:  Shortness of breath. EXAM: PORTABLE CHEST 1 VIEW COMPARISON:  03/18/2020 FINDINGS: The cardiac silhouette, mediastinal and hilar contours are within normal limits and stable. Stable underlying emphysematous changes and pulmonary scarring. Suspect streaky bibasilar infiltrates. No pleural effusions. No worrisome pulmonary lesions. IMPRESSION: 1. Underlying emphysematous changes and pulmonary scarring. 2. Suspect streaky bibasilar infiltrates. Electronically Signed   By: Marijo Sanes M.D.   On: 03/19/2020 07:59   US Abdomen Limited RUQ  Result Date: 03/18/2020 CLINICAL DATA:  Nausea and vomiting EXAM: ULTRASOUND ABDOMEN LIMITED RIGHT UPPER QUADRANT COMPARISON:  CT abdomen and pelvis 03/17/2020 FINDINGS: Gallbladder: Large shadowing calculus within gallbladder 19 mm diameter. No gallbladder wall thickening, pericholecystic fluid or sonographic Murphy sign. Common bile duct: Diameter: 3 mm, normal Liver: Normal appearance. No mass or nodularity. Portal vein is patent on color Doppler imaging with normal direction of blood flow towards the liver. Other: Trace perihepatic free fluid. IMPRESSION: Cholelithiasis without evidence of cholecystitis. Trace ascites adjacent to liver. Electronically Signed   By: Lavonia Dana M.D.   On: 03/18/2020 13:32    Labs:  CBC: Recent Labs    03/18/20 0605 03/19/20 0256 03/20/20 0419 03/21/20 0509  WBC 12.0* 11.8* 11.5* 10.7*  HGB 8.4* 8.4* 8.1* 8.5*  HCT 23.5* 23.6* 23.2* 24.7*  PLT 292 331 251 289    COAGS: No results for input(s): INR, APTT in the last 8760 hours.  BMP: Recent Labs    03/19/20 2347 03/19/20 2347 03/20/20 0419 03/20/20 0419 03/20/20 0706 03/20/20 0953 03/20/20 1244 03/21/20 0509  NA 117*   < > 118*   < > 118* 119* 121* 128*  K 3.7  --  3.6  --   --   --  4.0 3.8  CL 81*  --  83*  --   --   --  85* 93*  CO2 14*  --  15*  --   --   --  16* 18*  GLUCOSE 82  --  90  --   --   --  100* 170*  BUN 120*  --  121*   --   --   --  123* 74*  CALCIUM 8.1*  --  8.2*  --   --   --  8.1* 8.5*  CREATININE 8.93*  --  8.93*  --   --   --  9.38* 6.82*  GFRNONAA 4*  --  4*  --   --   --  4* 5*  GFRAA 5*  --  5*  --   --   --  4* 6*   < > = values in this interval not displayed.    LIVER FUNCTION TESTS: Recent Labs    03/17/20 1821 03/18/20 0954 03/18/20 1644 03/19/20 0256 03/20/20 0419 03/21/20 0509  BILITOT 0.7  --   --  0.5 0.4 1.0  AST 19  --   --  _0 ALT 13  --   --  _1 ALKPHOS 71  --   --  60 62 70  PROT 7.8  --   --  6.6 6.4* 6.7  ALBUMIN 3.2*   < > 2.4* 2.6* 2.4* 2.5*   < > = values in this interval not displayed.    Assessment and Plan:  Acute kidney injury: Jasmine White, 74 year old female, is s/p tunneled HD catheter and is now scheduled for a random renal biopsy on 03/22/20.   Risks and benefits of a random renal biopsy were discussed with the patient and/or patient's family including, but not limited to bleeding, infection, damage to adjacent structures or low yield requiring additional tests.  All of the questions were answered and there is agreement to proceed.  Consent signed and in chart.  Electronically Signed: Soyla Dryer, AGACNP-BC [phone] 03/21/2020, 10:56 AM   I spent a total of 15 Minutes at the the patient's bedside AND on the patient's hospital floor or unit, greater than 50% of which was counseling/coordinating care for random renal biopsy.

## 2020-03-21 NOTE — Progress Notes (Signed)
Patient back from dialysis. Alert and oriented x 4; no acute distress noted, no complaints. VS stable. Will continue to monitor.  

## 2020-03-21 NOTE — Progress Notes (Signed)
Rehab Admissions Coordinator Note:  Patient was screened by Cleatrice Burke for appropriateness for an Inpatient Acute Rehab Consult per PT recs.   At this time, we are recommending Inpatient Rehab consult. I will place order per protocol.  Cleatrice Burke RN MSN 03/21/2020, 1:03 PM  I can be reached at [phone].

## 2020-03-21 NOTE — Progress Notes (Signed)
Patient going to dialysis. Alert and oriented x 4; no acute distress noted, no complaints. VS stable.

## 2020-03-22 ENCOUNTER — Inpatient Hospital Stay (HOSPITAL_COMMUNITY): Payer: Medicare PPO

## 2020-03-22 DIAGNOSIS — R7989 Other specified abnormal findings of blood chemistry: Secondary | ICD-10-CM

## 2020-03-22 DIAGNOSIS — E871 Hypo-osmolality and hyponatremia: Secondary | ICD-10-CM

## 2020-03-22 LAB — PHOSPHORUS: Phosphorus: 5.3 mg/dL — ABNORMAL HIGH (ref 2.5–4.6)

## 2020-03-22 LAB — CBC WITH DIFFERENTIAL/PLATELET
Abs Immature Granulocytes: 0.12 10*3/uL — ABNORMAL HIGH (ref 0.00–0.07)
Basophils Absolute: 0 10*3/uL (ref 0.0–0.1)
Basophils Relative: 0 %
Eosinophils Absolute: 0 10*3/uL (ref 0.0–0.5)
Eosinophils Relative: 0 %
HCT: 22.9 % — ABNORMAL LOW (ref 36.0–46.0)
Hemoglobin: 8 g/dL — ABNORMAL LOW (ref 12.0–15.0)
Immature Granulocytes: 1 %
Lymphocytes Relative: 2 %
Lymphs Abs: 0.3 10*3/uL — ABNORMAL LOW (ref 0.7–4.0)
MCH: 27.3 pg (ref 26.0–34.0)
MCHC: 34.9 g/dL (ref 30.0–36.0)
MCV: 78.2 fL — ABNORMAL LOW (ref 80.0–100.0)
Monocytes Absolute: 0.3 10*3/uL (ref 0.1–1.0)
Monocytes Relative: 2 %
Neutro Abs: 13.2 10*3/uL — ABNORMAL HIGH (ref 1.7–7.7)
Neutrophils Relative %: 95 %
Platelets: 281 10*3/uL (ref 150–400)
RBC: 2.93 MIL/uL — ABNORMAL LOW (ref 3.87–5.11)
RDW: 13.9 % (ref 11.5–15.5)
WBC: 14 10*3/uL — ABNORMAL HIGH (ref 4.0–10.5)
nRBC: 0 % (ref 0.0–0.2)

## 2020-03-22 LAB — THERAPEUTIC PLASMA EXCHANGE (BLOOD BANK)
Plasma Exchange: 2956
Plasma volume needed: 2956
Unit division: 0
Unit division: 0
Unit division: 0
Unit division: 0
Unit division: 0
Unit division: 0
Unit division: 0
Unit division: 0
Unit division: 0

## 2020-03-22 LAB — BRAIN NATRIURETIC PEPTIDE: B Natriuretic Peptide: 3320.9 pg/mL — ABNORMAL HIGH (ref 0.0–100.0)

## 2020-03-22 LAB — COMPREHENSIVE METABOLIC PANEL
ALT: 14 U/L (ref 0–44)
AST: 23 U/L (ref 15–41)
Albumin: 3 g/dL — ABNORMAL LOW (ref 3.5–5.0)
Alkaline Phosphatase: 52 U/L (ref 38–126)
Anion gap: 12 (ref 5–15)
BUN: 41 mg/dL — ABNORMAL HIGH (ref 8–23)
CO2: 28 mmol/L (ref 22–32)
Calcium: 8.8 mg/dL — ABNORMAL LOW (ref 8.9–10.3)
Chloride: 95 mmol/L — ABNORMAL LOW (ref 98–111)
Creatinine, Ser: 4.54 mg/dL — ABNORMAL HIGH (ref 0.44–1.00)
GFR calc Af Amer: 10 mL/min — ABNORMAL LOW (ref >60)
GFR calc non Af Amer: 9 mL/min — ABNORMAL LOW (ref >60)
Glucose, Bld: 190 mg/dL — ABNORMAL HIGH (ref 70–99)
Potassium: 3.4 mmol/L — ABNORMAL LOW (ref 3.5–5.1)
Sodium: 135 mmol/L (ref 135–145)
Total Bilirubin: 0.7 mg/dL (ref 0.3–1.2)
Total Protein: 6.3 g/dL — ABNORMAL LOW (ref 6.5–8.1)

## 2020-03-22 LAB — C-REACTIVE PROTEIN: CRP: 6.4 mg/dL — ABNORMAL HIGH (ref <1.0)

## 2020-03-22 LAB — D-DIMER, QUANTITATIVE: D-Dimer, Quant: 3.62 ug/mL-FEU — ABNORMAL HIGH (ref 0.00–0.50)

## 2020-03-22 LAB — PROTIME-INR
INR: 1.2 (ref 0.8–1.2)
Prothrombin Time: 14.3 seconds (ref 11.4–15.2)

## 2020-03-22 LAB — MAGNESIUM: Magnesium: 2.2 mg/dL (ref 1.7–2.4)

## 2020-03-22 MED ORDER — DARBEPOETIN ALFA 40 MCG/0.4ML IJ SOSY
PREFILLED_SYRINGE | INTRAMUSCULAR | Status: AC
  Filled 2020-03-22: qty 0.4

## 2020-03-22 MED ORDER — TECHNETIUM TO 99M ALBUMIN AGGREGATED: 4.3000 | Freq: Once | INTRAVENOUS | Status: DC | PRN

## 2020-03-22 MED ORDER — DARBEPOETIN ALFA 40 MCG/0.4ML IJ SOSY
40.0000 ug | PREFILLED_SYRINGE | INTRAMUSCULAR | Status: DC
  Administered 2020-03-22: 40 ug via SUBCUTANEOUS
  Filled 2020-03-22: qty 0.4

## 2020-03-22 MED ORDER — CHLORHEXIDINE GLUCONATE CLOTH 2 % EX PADS
6.0000 | MEDICATED_PAD | Freq: Every day | CUTANEOUS | Status: DC
  Administered 2020-03-23: 6 via TOPICAL

## 2020-03-22 MED ORDER — METOPROLOL TARTRATE 12.5 MG HALF TABLET
100.0000 mg | ORAL_TABLET | Freq: Two times a day (BID) | ORAL | Status: DC
  Administered 2020-03-22 – 2020-04-07 (×28): 100 mg via ORAL
  Filled 2020-03-22 (×2): qty 1
  Filled 2020-03-22: qty 8
  Filled 2020-03-22 (×2): qty 1
  Filled 2020-03-22: qty 8
  Filled 2020-03-22 (×3): qty 1
  Filled 2020-03-22: qty 8
  Filled 2020-03-22 (×9): qty 1
  Filled 2020-03-22: qty 8
  Filled 2020-03-22 (×10): qty 1

## 2020-03-22 MED ORDER — MIDAZOLAM HCL 2 MG/2ML IJ SOLN
INTRAMUSCULAR | Status: AC
  Filled 2020-03-22: qty 2

## 2020-03-22 MED ORDER — LIDOCAINE HCL (PF) 1 % IJ SOLN
INTRAMUSCULAR | Status: AC
  Filled 2020-03-22: qty 30

## 2020-03-22 MED ORDER — FENTANYL CITRATE (PF) 100 MCG/2ML IJ SOLN
INTRAMUSCULAR | Status: AC | PRN
  Administered 2020-03-22: 50 ug via INTRAVENOUS

## 2020-03-22 MED ORDER — FENTANYL CITRATE (PF) 100 MCG/2ML IJ SOLN
INTRAMUSCULAR | Status: AC
  Filled 2020-03-22: qty 2

## 2020-03-22 MED ORDER — PREDNISONE 20 MG PO TABS
60.0000 mg | ORAL_TABLET | Freq: Every day | ORAL | Status: DC
  Administered 2020-03-23 – 2020-03-29 (×7): 60 mg via ORAL
  Filled 2020-03-22 (×8): qty 3

## 2020-03-22 MED ORDER — HEPARIN SODIUM (PORCINE) 1000 UNIT/ML IJ SOLN
INTRAMUSCULAR | Status: AC
  Administered 2020-03-22: 1000 [IU]
  Filled 2020-03-22: qty 4

## 2020-03-22 MED ORDER — MIDAZOLAM HCL 2 MG/2ML IJ SOLN
INTRAMUSCULAR | Status: AC | PRN
  Administered 2020-03-22: 1 mg via INTRAVENOUS

## 2020-03-22 NOTE — Progress Notes (Signed)
Patient is alert and oriented and in good spirits with husband at her bedside.  Gave patient ice chips per request.  I did advise patient she is on fluid restriction of 1200 ml.

## 2020-03-22 NOTE — TOC Initial Note (Addendum)
Transition of Care [facility]) - Initial/Assessment Note    Patient Details  Name: Jasmine White MRN: [id number] Date of Birth: [birth year]  Transition of Care [facility]) CM/SW Contact:    Verdell Carmine, RN Phone Number: 03/22/2020, 3:19 PM  Clinical Narrative:                 Admitted with vomiting, hx of COVID chronic back pain and fatigue.  Assessment was Severe dehydration and AKI.  Vasculitis, nephritis. Treatment is plasmapheresis,, 7 treatments every other day combined with dialysis in between.   Anticipation of discharge is CIR vs SNF had a renal biopsy this AM .  Still  Several days of plasmapheresis left to go. CSW and CM to continue to follow for needs   Expected Discharge Plan: [place] Barriers to Discharge: Continued Medical Work up CIR recommended by OT Patient Goals and CMS Choice  Could be going to CIR VS SNF      Expected Discharge Plan and Services Expected Discharge Plan: [place] arrangements for the past 2 months: Single Family Home                                      Prior Living Arrangements/Services Living arrangements for the past 2 months: Single Family Home Lives with:: Spouse          Need for Family Participation in Patient Care: Yes (Comment) Care giver support system in place?: Yes (comment)   Criminal Activity/Legal Involvement Pertinent to Current Situation/Hospitalization: No - Comment as needed  Activities of Daily Living Home Assistive Devices/Equipment: None ADL Screening (condition at time of admission) Patient's cognitive ability adequate to safely complete daily activities?: Yes Is the patient deaf or have difficulty hearing?: Yes Does the patient have difficulty seeing, even when wearing glasses/contacts?: No Does the patient have difficulty concentrating, remembering, or making decisions?: No Patient able to express need for assistance with ADLs?: Yes Does the  patient have difficulty dressing or bathing?: Yes Independently performs ADLs?: Yes (appropriate for developmental age) Does the patient have difficulty walking or climbing stairs?: No Weakness of Legs: None Weakness of Arms/Hands: None  Permission Sought/Granted                  Emotional Assessment Appearance:: Appears stated age     Orientation: : Oriented to Self, Oriented to Place, Oriented to  Time, Oriented to Situation Alcohol / Substance Use: Not Applicable Psych Involvement: No (comment)  Admission diagnosis:  Hyponatremia [E87.1] Acute renal failure, unspecified acute renal failure type ([place]) [N17.9] Intractable vomiting with nausea, unspecified vomiting type [R11.2] Patient Active Problem List   Diagnosis Date Noted  . Hyponatremia 03/17/2020  . Primary insomnia 01/20/2019  . BMI 27.0-27.9,adult 06/29/2015  . Chronic back pain 07/06/2014  . Essential hypertension, benign 12/21/2012  . Hyperlipidemia with target LDL less than 130 12/21/2012  . Hypothyroidism 12/21/2012  . Depression 12/21/2012   PCP:  Chevis Pretty, FNP Pharmacy:   [place], [address] [place] [address] Phone: [phone] Fax: [phone]  [facility] [place], [address] [place] [address] Phone: [phone] Fax: [phone]  [id number] - [place], [address] [place] [address]  Phone: [phone] Fax: [phone]     Social Determinants of Health (SDOH) Interventions    Readmission Risk Interventions No flowsheet data found.

## 2020-03-22 NOTE — Progress Notes (Signed)
Patient back from biopsy alert and oriented in no acute distress.   Bed rest for at least 4 hours. Will continue to monitor closely

## 2020-03-22 NOTE — Progress Notes (Signed)
Bilateral lower extremity venous duplex has been completed. Preliminary results can be found in CV Proc through chart review.   03/22/20 11:08 AM Carlos Levering RVT

## 2020-03-22 NOTE — Procedures (Signed)
Interventional Radiology Procedure:   Indications: AKI.  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  2 cores from left kidney lower pole, no immediate hematoma or bleeding.  Complications: None     EBL: less than 10 ml  Plan: Bedrest 4 hours   Jasmine White R. Anselm Pancoast, MD  Pager: [phone]

## 2020-03-22 NOTE — Progress Notes (Signed)
[facility] Progress Note  Name: Jasmine White MRN: [id number] DOB: September 29, 1945  Chief Complaint:  n/v  Subjective:   States "I feel like a million bucks compared to before".  Has her renal biopsy later today.  We discussed risks/benefits/indications for ESA therapy and she agrees to receive.   Review of systems:    Reports nausea after a pill - better now  Has been NPO for biopsy except for meds/sips Denies any shortness of breath  denies chest pain  States feels much stronger Constipation better -------------  Background on consult:  HPI: Pt is a 51F with a PMH sig for HTN, HLD, hypothyroidism, COVID in November who is now seen in consultation at the request of Dr. Candiss Norse for eval and recs re: AKI, hyponatremia.  Pt has had COVID in November and since then hasn't felt right.  Having burning every night in her legs for which she was taking Advil nightly.  Takes hyzaar for HTN.  Previously Cr was 0.78 in Dec 2020.  Went to PCP 03/01/20 and was 2.34 then.  Has had one week of n/v.  Has still been taking her Advil and her hyzaar.  Went to [facility] last night where she was found to have Cr of > 7, BUN 119, and severe hyponatremia at 109. CT scan showed large amount of inspissated stool and possibly thickened sigmoid colon and stomach/ GB.  Was given 1L NS, started on 50 mL/ hr NS, and transferred to [facility] for admission.  Pt states she's very thirsty right now.  No dizziness/ falls/ seizure like activity.  She has been hypertensive here.  Urine lytes show isosthenuria with urine Na 24.  (? If before or after IV NS bolus).  Na 112 this AM, no real movement in BUN/ Cr.  No HA, f/c, SOB, rashes, nosebleeds, red/ itchy eyes, nasal crusting, coughing up blood, blood in urine/ stool.  Says some foamy urine.    Intake/Output Summary (Last 24 hours) at 03/22/2020 0614 Last data filed at 03/22/2020 0300 Gross per 24 hour  Intake 280 ml  Output 825 ml  Net -545 ml    Vitals:  Vitals:    03/21/20 1623 03/21/20 2010 03/22/20 0000 03/22/20 0400  BP: (!) 159/73 (!) 156/76 (!) 147/73 (!) 147/72  Pulse: 93 96 82 85  Resp: 18 18 17 12   Temp: 98.7 F (37.1 C) 98.1 F (36.7 C) 98.1 F (36.7 C) 98.1 F (36.7 C)  TempSrc: Oral Oral Oral Oral  SpO2: 99% 97% 94% 97%  Weight:    65.4 kg  Height:         Physical Exam:   General adult female in bed in no acute distress HEENT normocephalic atraumatic extraocular movements intact sclera anicteric Neck supple trachea midline Lungs crackles on auscultation bilaterally normal work of breathing at rest; on 1 liter oxygen Heart S1S2 no rub Abdomen soft nontender nondistended Extremities no pitting edema appreciated Psych normal mood and affect Neuro alert and oriented x 3 provides hx and follows commands  GU - foley catheter in place with urine Access: RIJ tunneled HD catheter in place   Medications reviewed   Labs:  BMP Latest Ref Rng & Units 03/22/2020 03/21/2020 03/20/2020  Glucose 70 - 99 mg/dL 190(H) 170(H) 100(H)  BUN 8 - 23 mg/dL 41(H) 74(H) 123(H)  Creatinine 0.44 - 1.00 mg/dL 4.54(H) 6.82(H) 9.38(H)  BUN/Creat Ratio 12 - 28 - - -  Sodium 135 - 145 mmol/L 135 128(L) 121(L)  Potassium  3.5 - 5.1 mmol/L 3.4(L) 3.8 4.0  Chloride 98 - 111 mmol/L 95(L) 93(L) 85(L)  CO2 22 - 32 mmol/L 28 18(L) 16(L)  Calcium 8.9 - 10.3 mg/dL 8.8(L) 8.5(L) 8.1(L)     Assessment/Plan:   # AKI 2/2 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  Had Cr of 2.3 two weeks prior to presentation then rose to > 7 - 8 in the setting of 1 week of n/v, Advil, and Hyzaar. Work-up has included: Anti-GBM elevated at 137, MPO elevated at 10.3.  Note ANA also positive and anti-dsDNA elevated.   up/c ratio 7430 mg/g.  Hep C nonreactive, hep B negative, HIV nonreactive.  CT with mod distended urinary bladder, no hydro.  PR3 neg. Complement normal. SPEP no M spike. free light chains ratio normal. ASO negative.  HD  started on 6/29 after tunneled catheter with IR.  First pheresis on 6/30.  -------- - HD today after renal biopsy 7/1.  Then assess needs daily.  Goal to avoid HD and pheresis on same day for the patient if possible - Solumedrol 1 gram daily x 3 days (started on 6/29 and to end today) then will transition to oral pred 60 mg daily.   - Continue Pheresis with [facility]FP - for now plan for every other day x 7 treatments.  Pheresis #2 on 7/2. - Consulted IR for renal biopsy for 7/1 - appreciate their assistance  - Continue foley catheter for now - reassess possible removal on 7/2  # Severe hypovolemic hyponatremia: in setting of n/v and HCTZ use as well as profound AKI.  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  S/p Cautious correction to avoid adverse outcome.  - Lowest Na concentration again today   - hypothyroidism per primary team    # Proteinuria  - up/c ratio 7430 mg/g with concurrent hematuria - work-up as above    # Metabolic acidosis - 2/2 AKI  - managed with HD   # Constipation - improving; please avoid all Mg containing laxatives/ enemas and fleets enemas.  Per primary   # HTN: holding her home hyzaar with AKI.  Increased hydralazine to 50 mg TID on 6/29; UF today gently with HD.  Increased metoprolol tartrate to 100 mg BID (note home med is metop succinate 100 mg daily) on 7/1  # Hyperphosphatemia - renal diet and renvela when taking PO.  Check phos daily    # Possible UTI- though rare bact on the next day's UA and no growth on cx (appears obtained on ceftriaxone).  Defer abx to primary team  # Abnormal CT scan- RUQ [place] with cholelithiasis without evidence of cholecystitis   # Microcytic anemia  - iron, B12, and folate acceptable  - aranesp 40 mcg for 7/1  # Dispo: continue inpatient monitoring   Claudia Desanctis, MD 03/22/2020  6:35 AM

## 2020-03-22 NOTE — Progress Notes (Signed)
Report from  [place] ( 142/77)    Patient is asleep at this time and being brought back up to floor.   Left AC IV line taken out also because it wasn't working

## 2020-03-22 NOTE — Progress Notes (Signed)
PROGRESS NOTE                                                                                                                                                                                                             Patient Demographics:    Jasmine White, is a 74 y.o. female, DOB - 22-May-1946, IRJ:[id number]  Admit date - 03/17/2020   Admitting Physician Clance Boll, MD  Outpatient Primary MD for the patient is Chevis Pretty, [place]  LOS - 5  Chief Complaint  Patient presents with  . Emesis       Brief Narrative  -  Jasmine White is a 74 y.o. female with medical history significant of  Chronic back pain HTN, hypothyroidism, recently diagnosed CKD, HLD, Hx of COVID infection and recovery with persistent fatigue who presents with vomiting. Per patient states she has had intermittent n/v that started 9 days ago, in the ER she had changes suggestive of severe constipation and stool burden on CT scan, her blood numbers were suggestive of UTI, severe dehydration, hypernatremia and AKI.  She was admitted for further treatment.   Subjective:   Patient in bed, appears comfortable, denies any headache, no fever, no chest pain or pressure, no shortness of breath , no abdominal pain. No focal weakness.   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 -Anti-GBM elevated at 137, MPO elevated at 10.3.  Note ANA also positive and anti-dsDNA elevated -Management per nephrology, treatment scheduled IV Solu-Medrol 1 g daily x3 daily, start date 6/29, and plan to treat for total of 7 days of pheresis, which is of hemodialysis days started on 6/30, and she was started hemodialysis on 6/29 after tunneled catheter inserted by IR. -Status post renal biopsy 7/1. -Management per renal  UTI.  Treated with  Rocephin  Hyponatremia -Severe hypovolemic hyponatremia, in the setting of nausea and vomiting and hydrochlorothiazide use, management per renal.   Elevated D-dimers -This is most likely in the setting of renal failure, and systematic inflammatory response, venous Dopplers negative, I will obtain VQ scan to rule out PE. -But for now we will hold subcu heparin in the setting of her receiving renal biopsy this morning  Essential hypertension.  Already on beta-blocker along with hydralazine added Norvasc for better control.  Chronic microcytic anemia.  Check anemia panel.  Was  recently placed on iron supplementation by PCP, if iron deficient will defer to PCP on iron deficiency anemia work-up.  For now placed on PPI.  Severe stool burden and constipation.  Placed on aggressive bowel regimen.  Abdominal exam benign.  Weakness and deconditioning.  PT OT.  Acidosis due to renal insufficiency.  Bicarb per nephrology.  Hypothyroidism.  Continue home dose Synthroid, TSH is stable.    Condition - Extremely Guarded  Family Communication  : Discussed in detail with the patient  Code Status :  Full  Consults  :  None  Procedures  :   CT - 1. Large volume of inspissated fecal material noted throughout the colon. Mild distal colonic thickening versus underdistention of the sigmoid without evidence of inflammation. Correlate for features of constipation. Furthermore, could consider correlation with most recent colonoscopy or outpatient visualization if not recently performed. 2. Similar thickening versus underdistention of the stomach, could reflect gastritis or secondary change given patient's prolonged emesis. 3. Moderately distended urinary bladder with layering air-fluid level. Could reflect recent instrumentation/catheterization, however if urinary symptoms are present, recommend further evaluation with urinalysis to exclude cystitis. 4. Cholelithiasis without evidence of acute cholecystitis.  Mild focal thickening at the gallbladder fundus is suspected to reflect adenomyomatosis but could correlate with outpatient right upper quadrant ultrasound. Could perform on acute basis if biliary symptoms are present. 5. Aortic Atherosclerosis (ICD10-I70.0).  PUD Prophylaxis : PPI  Disposition Plan  :    Status is: Inpatient  Remains inpatient appropriate because:IV treatments appropriate due to intensity of illness or inability to take PO   Dispo: The patient is from: Home              Anticipated d/c is to: SNF              Anticipated d/c date is: 3 days              Patient currently is not medically stable to d/c.   DVT Prophylaxis  :   Heparin on hold given biopsy this morning  Lab Results  Component Value Date   PLT 281 03/22/2020    Diet :  Diet Order            Diet NPO time specified Except for: Sips with Meds  Diet effective midnight                  Inpatient Medications Scheduled Meds: . amLODipine  10 mg Oral Daily  . Chlorhexidine Gluconate Cloth  6 each Topical Q0600  . Chlorhexidine Gluconate Cloth  6 each Topical Q0600  . darbepoetin (ARANESP) injection - NON-DIALYSIS  40 mcg Subcutaneous Q Thu-1800  . docusate sodium  200 mg Oral BID  . hydrALAZINE  50 mg Oral TID  . levothyroxine  88 mcg Oral Q0600  . lidocaine (PF)      . metoprolol tartrate  100 mg Oral BID  . pantoprazole  40 mg Oral Daily  . polyethylene glycol  17 g Oral BID  . [START ON 03/23/2020] predniSONE  60 mg Oral Q breakfast  . sevelamer carbonate  800 mg Oral TID WC   Continuous Infusions: . methylPREDNISolone (SOLU-MEDROL) injection 1,000 mg (03/21/20 1628)   PRN Meds:.acetaminophen **OR** [DISCONTINUED] acetaminophen, albuterol, [DISCONTINUED] ondansetron **OR** ondansetron (ZOFRAN) IV  Antibiotics  :   Anti-infectives (From admission, onward)   Start     Dose/Rate Route Frequency Ordered Stop   03/20/20 1319  ceFAZolin (ANCEF) 2-4 GM/100ML-% IVPB  Note to Pharmacy:  Arlean Hopping   : cabinet override      03/20/20 1319 03/21/20 0129   03/20/20 1100  ceFAZolin (ANCEF) IVPB 2g/100 mL premix        2 g 200 mL/hr over 30 Minutes Intravenous On call 03/20/20 1040 03/20/20 1506   03/19/20 0000  cefTRIAXone (ROCEPHIN) 1 g in sodium chloride 0.9 % 100 mL IVPB       Note to Pharmacy: 1st dose now   1 g 200 mL/hr over 30 Minutes Intravenous Every 24 hours 03/18/20 0127 03/21/20 0008   03/18/20 0145  cefTRIAXone (ROCEPHIN) 1 g in sodium chloride 0.9 % 100 mL IVPB       Note to Pharmacy: 1st dose now   1 g 200 mL/hr over 30 Minutes Intravenous  Once 03/18/20 0135 03/18/20 0252          Objective:   Vitals:   03/22/20 1200 03/22/20 1230 03/22/20 1300 03/22/20 1330  BP: 126/66 112/61 119/62 129/68  Pulse: 76 75 77 78  Resp: _0 Temp:      TempSrc:      SpO2:      Weight:      Height:        SpO2: 94 % O2 Flow Rate (L/min): 1 L/min  Wt Readings from Last 3 Encounters:  03/22/20 66.1 kg  03/01/20 61.7 kg  09/02/19 60.3 kg     Intake/Output Summary (Last 24 hours) at 03/22/2020 1400 Last data filed at 03/22/2020 0300 Gross per 24 hour  Intake 50 ml  Output 200 ml  Net -150 ml     Physical Exam  Awake Alert, Oriented X 3, No new F.N deficits, Normal affect Symmetrical Chest wall movement, Good air movement bilaterally, CTAB RRR,No Gallops,Rubs or new Murmurs, No Parasternal Heave +ve B.Sounds, Abd Soft, No tenderness, No rebound - guarding or rigidity. No Cyanosis, Clubbing or edema, No new Rash or bruise       Data Review:    Recent Labs  Lab 03/18/20 0605 03/19/20 0256 03/20/20 0419 03/21/20 0509 03/22/20 0103  WBC 12.0* 11.8* 11.5* 10.7* 14.0*  HGB 8.4* 8.4* 8.1* 8.5* 8.0*  HCT 23.5* 23.6* 23.2* 24.7* 22.9*  PLT 292 331 251 289 281  MCV 75.6* 75.9* 76.8* 76.7* 78.2*  MCH 27.0 27.0 26.8 26.4 27.3  MCHC 35.7 35.6 34.9 34.4 34.9  RDW 13.1 13.2 13.3 13.6 13.9  LYMPHSABS  --  0.8 0.5* 0.2* 0.3*  MONOABS  --  0.6  0.4 0.0* 0.3  EOSABS  --  0.1 0.0 0.0 0.0  BASOSABS  --  0.0 0.0 0.0 0.0    Recent Labs  Lab 03/17/20 1821 03/17/20 1821 03/17/20 2043 03/17/20 2043 03/18/20 0116 03/18/20 0605 03/18/20 0745 03/18/20 0748 03/18/20 0954 03/18/20 0954 03/18/20 1644 03/18/20 2048 03/19/20 0256 03/19/20 0806 03/19/20 2347 03/19/20 2347 03/20/20 0419 03/20/20 0419 03/20/20 0706 03/20/20 0953 03/20/20 1244 03/21/20 0509 03/21/20 1931 03/22/20 0103  NA 109*   < > 112*   < > 112*   < >  --   --  113*   < > 112*   < > 114*   < > 117*   < > 118*   < > 118* 119* 121* 128*  --  135  K 3.5   < > 3.4*   < > 3.3*   < >  --   --  4.4   < > 3.3*   < > 4.2   < >  3.7  --  3.6  --   --   --  4.0 3.8  --  3.4*  CL 68*   < > 73*   < > 74*   < >  --   --  74*   < > 75*   < > 77*   < > 81*  --  83*  --   --   --  85* 93*  --  95*  CO2 21*   < > 20*   < > 21*   < >  --   --  18*   < > 19*   < > 18*   < > 14*  --  15*  --   --   --  16* 18*  --  28  GLUCOSE 102*   < > 95   < > 89   < >  --   --  87   < > 125*   < > 91   < > 82  --  90  --   --   --  100* 170*  --  190*  BUN 118*   < > 116*   < > 117*   < >  --   --  121*   < > 121*   < > 121*   < > 120*  --  121*  --   --   --  123* 74*  --  41*  CREATININE 7.12*   < > 7.25*   < > 7.59*   < >  --   --  7.81*   < > 7.87*   < > 8.50*   < > 8.93*  --  8.93*  --   --   --  9.38* 6.82*  --  4.54*  CALCIUM 8.7*   < > 8.1*   < > 8.4*   < >  --   --  8.6*   < > 8.2*   < > 8.4*   < > 8.1*  --  8.2*  --   --   --  8.1* 8.5*  --  8.8*  AST 19  --   --   --   --   --   --   --   --   --   --   --  18  --   --   --  20  --   --   --   --  22  --  23  ALT 13  --   --   --   --   --   --   --   --   --   --   --  11  --   --   --  13  --   --   --   --  13  --  14  ALKPHOS 71  --   --   --   --   --   --   --   --   --   --   --  60  --   --   --  62  --   --   --   --  70  --  52  BILITOT 0.7  --   --   --   --   --   --   --   --   --   --   --  0.5  --   --   --  0.4  --   --    --   --  1.0  --  0.7  ALBUMIN 3.2*   < >  --   --   --   --   --   --  2.7*   < > 2.4*  --  2.6*  --   --   --  2.4*  --   --   --   --  2.5*  --  3.0*  MG  --   --   --   --   --   --   --   --   --   --   --   --  2.8*  --   --   --  2.7*  --   --   --   --  2.6*  --  2.2  CRP  --   --   --   --   --   --  9.0*  --   --   --   --   --  11.4*  --   --   --  12.3*  --   --   --   --  19.2*  --  6.4*  DDIMER  --   --   --   --   --   --   --   --  3.07*  --   --   --  3.30*  --   --   --  3.65*  --   --   --   --  7.21*  --  3.62*  PROCALCITON  --   --   --   --   --   --  0.17  --   --   --   --   --  0.22  --   --   --  0.29  --   --   --   --   --   --   --   INR  --   --   --   --   --   --   --   --   --   --   --   --   --   --   --   --   --   --   --   --   --   --   --  1.2  TSH  --   --  1.209  --  1.093  --   --   --   --   --   --   --   --   --   --   --   --   --   --   --   --   --   --   --   HGBA1C  --   --   --   --   --   --   --   --   --   --   --   --   --   --   --   --   --   --   --   --   --   --  5.9*  --   BNP  --   --   --   --   --   --   --  530.8*  --   --   --   --  1,430.6*  --   --   --  2,535.5*  --   --   --   --  2,263.4*  --  3,320.9*   < > = values in this interval not displayed.    Recent Labs  Lab 03/17/20 1918 03/18/20 0745 03/18/20 0748 03/18/20 0954 03/19/20 0256 03/20/20 0419 03/21/20 0509 03/22/20 0103  CRP  --  9.0*  --   --  11.4* 12.3* 19.2* 6.4*  DDIMER  --   --   --  3.07* 3.30* 3.65* 7.21* 3.62*  BNP  --   --  530.8*  --  1,430.6* 2,535.5* 2,263.4* 3,320.9*  PROCALCITON  --  0.17  --   --  0.22 0.29  --   --   SARSCOV2NAA NEGATIVE  --   --   --   --   --   --   --     ------------------------------------------------------------------------------------------------------------------ No results for input(s): CHOL, HDL, LDLCALC, TRIG, CHOLHDL, LDLDIRECT in the last 72 hours.  Lab Results  Component Value Date   HGBA1C 5.9 (H)  03/21/2020   ------------------------------------------------------------------------------------------------------------------ No results for input(s): TSH, T4TOTAL, T3FREE, THYROIDAB in the last 72 hours.  Invalid input(s): FREET3 ------------------------------------------------------------------------------------------------------------------ No results for input(s): VITAMINB12, FOLATE, FERRITIN, TIBC, IRON, RETICCTPCT in the last 72 hours.  Coagulation profile Recent Labs  Lab 03/22/20 0103  INR 1.2    Recent Labs    03/21/20 0509 03/22/20 0103  DDIMER 7.21* 3.62*    Cardiac Enzymes No results for input(s): CKMB, TROPONINI, MYOGLOBIN in the last 168 hours.  Invalid input(s): CK ------------------------------------------------------------------------------------------------------------------    Component Value Date/Time   BNP 3,320.9 (H) 03/22/2020 0103    Micro Results Recent Results (from the past 240 hour(s))  SARS Coronavirus 2 by RT PCR (hospital order, performed in [facility] hospital lab) Nasopharyngeal Nasopharyngeal Swab     Status: None   Collection Time: 03/17/20  7:18 PM   Specimen: Nasopharyngeal Swab  Result Value Ref Range Status   SARS Coronavirus 2 NEGATIVE NEGATIVE Final    Comment: (NOTE) SARS-CoV-2 target nucleic acids are NOT DETECTED.  The SARS-CoV-2 RNA is generally detectable in upper and lower respiratory specimens during the acute phase of infection. The lowest concentration of SARS-CoV-2 viral copies this assay can detect is 250 copies / mL. A negative result does not preclude SARS-CoV-2 infection and should not be used as the sole basis for treatment or other patient management decisions.  A negative result may occur with improper specimen collection / handling, submission of specimen other than nasopharyngeal swab, presence of viral mutation(s) within the areas targeted by this assay, and inadequate number of viral copies (<250  copies / mL). A negative result must be combined with clinical observations, patient history, and epidemiological information.  Fact Sheet for Patients:   StrictlyIdeas.no  Fact Sheet for Healthcare Providers: BankingDealers.co.za  This test is not yet approved or  cleared by the [place] FDA and has been authorized for detection and/or diagnosis of SARS-CoV-2 by FDA under an Emergency Use Authorization (EUA).  This EUA will remain in effect (meaning this test can be used) for the duration of the COVID-19 declaration under Section 564(b)(1) of the Act, 21 U.S.C. section 360bbb-3(b)(1), unless the authorization is terminated or revoked sooner.  Performed at [facility], [address], [place], [place] [address]   MRSA PCR Screening     Status: None   Collection Time: 03/17/20 11:47 PM   Specimen: Nasal Mucosa; Nasopharyngeal  Result Value Ref Range Status   MRSA by  PCR NEGATIVE NEGATIVE Final    Comment:        The GeneXpert MRSA Assay (FDA approved for NASAL specimens only), is one component of a comprehensive MRSA colonization surveillance program. It is not intended to diagnose MRSA infection nor to guide or monitor treatment for MRSA infections. Performed at [facility], [place] [address] [place], [place] [address]   Culture, Urine     Status: None   Collection Time: 03/19/20 10:43 AM   Specimen: Urine, Random  Result Value Ref Range Status   Specimen Description URINE, RANDOM  Final   Special Requests NONE  Final   Culture   Final    NO GROWTH Performed at [facility], [place] [address], [place], [place] [address]    Report Status 03/20/2020 FINAL  Final    Radiology Reports CT Abdomen Pelvis Wo Contrast  Result Date: 03/17/2020 CLINICAL DATA:  Vomiting for 1 week, low sella diam, elevated BUN and creatinine EXAM: CT ABDOMEN AND PELVIS WITHOUT CONTRAST TECHNIQUE: Multidetector CT imaging  of the abdomen and pelvis was performed following the standard protocol without IV contrast. COMPARISON:  Chest radiograph 12/26/2014, lumbar radiograph 08/13/2012, MR lumbar spine 06/16/2012 FINDINGS: Lower chest: Scarring and reticular changes noted in the lung bases, left greater than right. No consolidative opacity or pleural effusion. Normal heart size. No pericardial effusion. Tiny benign appearing macrocalcification versus fiducial in the right breast. Hepatobiliary: No visible focal liver lesions. Smooth liver surface contour. Normal hepatic attenuation. Gallbladder is moderately distended. Some focal thickening is noted towards the gallbladder fundus, possible adenomyomatosis. A partially calcified gallstone noted layering dependently as well. No pericholecystic fluid or inflammation. Normal caliber biliary tree without visible calcified intraductal gallstones. Pancreas: Moderate pancreatic atrophy. No inflammation or ductal dilatation. No discernible pancreatic lesions. Spleen: Normal in size without focal abnormality. Adrenals/Urinary Tract: No concerning adrenal lesions. No visible or contour deforming renal lesions. No urolithiasis or hydronephrosis. Urinary bladder is moderately distended with layering air-fluid level. No significant pericholecystic inflammation or urinary bladder wall thickening. Stomach/Bowel: Question some mild thickening versus underdistention of the stomach. Duodenum takes a normal course across the midline abdomen. No small bowel thickening or dilatation. Large volume of inspissated fecal material noted throughout the colon. Question some mild distal colonic thickening versus underdistention of the sigmoid. No adjacent inflammation, free fluid or evidence of perforation. No high-grade bowel obstruction. Vascular/Lymphatic: Atherosclerotic calcifications throughout the abdominal aorta and branch vessels. No aneurysm or ectasia. No enlarged abdominopelvic lymph nodes. Reproductive:  Uterus is surgically absent. No concerning adnexal lesions. Other: No abdominopelvic free fluid or free gas. No bowel containing hernias. Small fat containing umbilical hernia. Musculoskeletal: Prior L3-L5 posterior spinal fusion. No evidence of hardware failure or acute complication is seen. Mild anterior wedging and Schmorl's node at the L1 superior endplate is similar to comparison radiographs. Diffuse discogenic and facet degenerative changes noted throughout the lumbar spine at both instrumented in non instrumented levels. Additional degenerative changes in the hips and pelvis. The osseous structures appear diffusely demineralized which may limit detection of small or nondisplaced fractures. No acute or worrisome osseous lesions. IMPRESSION: 1. Large volume of inspissated fecal material noted throughout the colon. Mild distal colonic thickening versus underdistention of the sigmoid without evidence of inflammation. Correlate for features of constipation. Furthermore, could consider correlation with most recent colonoscopy or outpatient visualization if not recently performed. 2. Similar thickening versus underdistention of the stomach, could reflect gastritis or secondary change given patient's prolonged emesis. 3. Moderately  distended urinary bladder with layering air-fluid level. Could reflect recent instrumentation/catheterization, however if urinary symptoms are present, recommend further evaluation with urinalysis to exclude cystitis. 4. Cholelithiasis without evidence of acute cholecystitis. Mild focal thickening at the gallbladder fundus is suspected to reflect adenomyomatosis but could correlate with outpatient right upper quadrant ultrasound. Could perform on acute basis if biliary symptoms are present. 5. Aortic Atherosclerosis (ICD10-I70.0). Electronically Signed   By: Lovena Le M.D.   On: 03/17/2020 19:37   X-ray chest PA and lateral  Result Date: 03/18/2020 CLINICAL DATA:  Acute renal  insufficiency EXAM: CHEST - 2 VIEW COMPARISON:  December 26, 2014 FINDINGS: No pneumothorax. The cardiomediastinal silhouette is normal. No pulmonary nodules or masses. Mild bibasilar opacities, likely atelectasis. No overt edema. IMPRESSION: Probable mild bibasilar atelectasis. Recommend follow-up to resolution. No other acute abnormalities. Electronically Signed   By: Dorise Bullion III M.D   On: 03/18/2020 08:56   IR Fluoro Guide CV Line Right  Result Date: 03/20/2020 INDICATION: 74 year old female referred for hemodialysis catheter placement EXAM: IMAGE GUIDED PLACEMENT OF TUNNELED HEMODIALYSIS CATHETER MEDICATIONS: 2 g Ancef. The antibiotic was given in an appropriate time interval prior to skin puncture. ANESTHESIA/SEDATION: Moderate (conscious) sedation was employed during this procedure. A total of Versed 0.5 mg and Fentanyl 12.5 mcg was administered intravenously. Moderate Sedation Time: 19 minutes. The patient's level of consciousness and vital signs were monitored continuously by radiology nursing throughout the procedure under my direct supervision. FLUOROSCOPY TIME:  Fluoroscopy Time: 0 minutes 36 seconds (1 mGy). COMPLICATIONS: None PROCEDURE: Informed written consent was obtained from the patient after a discussion of the risks, benefits, and alternatives to treatment. Questions regarding the procedure were encouraged and answered. The right neck and chest were prepped with chlorhexidine in a sterile fashion, and a sterile drape was applied covering the operative field. Maximum barrier sterile technique with sterile gowns and gloves were used for the procedure. A timeout was performed prior to the initiation of the procedure. Ultrasound survey was performed. Micropuncture kit was utilized to access the right internal jugular vein under direct, real-time ultrasound guidance after the overlying soft tissues were anesthetized with 1% lidocaine with epinephrine. Stab incision was made with 11 blade  scalpel. Microwire was passed centrally. The microwire was then marked to measure appropriate internal catheter length. External tunneled length was estimated. A total tip to cuff length of 19 cm was selected. 035 guidewire was advanced to the level of the IVC. Skin and subcutaneous tissues of chest wall below the clavicle were generously infiltrated with 1% lidocaine for local anesthesia. A small stab incision was made with 11 blade scalpel. The selected hemodialysis catheter was tunneled in a retrograde fashion from the anterior chest wall to the venotomy incision. Serial dilation was performed and then a peel-away sheath was placed. The catheter was then placed through the peel-away sheath with tips ultimately positioned within the superior aspect of the right atrium. Final catheter positioning was confirmed and documented with a spot radiographic image. The catheter aspirates and flushes normally. The catheter was flushed with appropriate volume heparin dwells. The catheter exit site was secured with a 0-Prolene retention suture. Gel-Foam slurry was infused into the soft tissue tract. The venotomy incision was closed Derma bond and sterile dressing. Dressings were applied at the chest wall. Patient tolerated the procedure well and remained hemodynamically stable throughout. No complications were encountered and no significant blood loss encountered. IMPRESSION: Status post right IJ tunneled hemodialysis catheter placement. Catheter ready for use. Signed,  Dulcy Fanny. Dellia Nims, RPVI [facility] Electronically Signed   By: Corrie Mckusick D.O.   On: 03/20/2020 15:27   IR US Guide Vasc Access Right  Result Date: 03/20/2020 INDICATION: 74 year old female referred for hemodialysis catheter placement EXAM: IMAGE GUIDED PLACEMENT OF TUNNELED HEMODIALYSIS CATHETER MEDICATIONS: 2 g Ancef. The antibiotic was given in an appropriate time interval prior to skin  puncture. ANESTHESIA/SEDATION: Moderate (conscious) sedation was employed during this procedure. A total of Versed 0.5 mg and Fentanyl 12.5 mcg was administered intravenously. Moderate Sedation Time: 19 minutes. The patient's level of consciousness and vital signs were monitored continuously by radiology nursing throughout the procedure under my direct supervision. FLUOROSCOPY TIME:  Fluoroscopy Time: 0 minutes 36 seconds (1 mGy). COMPLICATIONS: None PROCEDURE: Informed written consent was obtained from the patient after a discussion of the risks, benefits, and alternatives to treatment. Questions regarding the procedure were encouraged and answered. The right neck and chest were prepped with chlorhexidine in a sterile fashion, and a sterile drape was applied covering the operative field. Maximum barrier sterile technique with sterile gowns and gloves were used for the procedure. A timeout was performed prior to the initiation of the procedure. Ultrasound survey was performed. Micropuncture kit was utilized to access the right internal jugular vein under direct, real-time ultrasound guidance after the overlying soft tissues were anesthetized with 1% lidocaine with epinephrine. Stab incision was made with 11 blade scalpel. Microwire was passed centrally. The microwire was then marked to measure appropriate internal catheter length. External tunneled length was estimated. A total tip to cuff length of 19 cm was selected. 035 guidewire was advanced to the level of the IVC. Skin and subcutaneous tissues of chest wall below the clavicle were generously infiltrated with 1% lidocaine for local anesthesia. A small stab incision was made with 11 blade scalpel. The selected hemodialysis catheter was tunneled in a retrograde fashion from the anterior chest wall to the venotomy incision. Serial dilation was performed and then a peel-away sheath was placed. The catheter was then placed through the peel-away sheath with tips  ultimately positioned within the superior aspect of the right atrium. Final catheter positioning was confirmed and documented with a spot radiographic image. The catheter aspirates and flushes normally. The catheter was flushed with appropriate volume heparin dwells. The catheter exit site was secured with a 0-Prolene retention suture. Gel-Foam slurry was infused into the soft tissue tract. The venotomy incision was closed Derma bond and sterile dressing. Dressings were applied at the chest wall. Patient tolerated the procedure well and remained hemodynamically stable throughout. No complications were encountered and no significant blood loss encountered. IMPRESSION: Status post right IJ tunneled hemodialysis catheter placement. Catheter ready for use. Signed, Dulcy Fanny. Dellia Nims, RPVI [facility] Electronically Signed   By: Corrie Mckusick D.O.   On: 03/20/2020 15:27   DG Chest Port 1 View  Result Date: 03/19/2020 CLINICAL DATA:  Shortness of breath. EXAM: PORTABLE CHEST 1 VIEW COMPARISON:  03/18/2020 FINDINGS: The cardiac silhouette, mediastinal and hilar contours are within normal limits and stable. Stable underlying emphysematous changes and pulmonary scarring. Suspect streaky bibasilar infiltrates. No pleural effusions. No worrisome pulmonary lesions. IMPRESSION: 1. Underlying emphysematous changes and pulmonary scarring. 2. Suspect streaky bibasilar infiltrates. Electronically Signed   By: Marijo Sanes M.D.   On: 03/19/2020 07:59   US BIOPSY (KIDNEY)  Result Date: 03/22/2020 INDICATION: 1 year old with acute kidney injury.  Request for renal biopsy. EXAM:  ULTRASOUND-GUIDED RANDOM LEFT RENAL BIOPSY MEDICATIONS: None. ANESTHESIA/SEDATION: Moderate (conscious) sedation was employed during this procedure. A total of Versed 1.0 mg and Fentanyl 50 mcg was administered intravenously. Moderate Sedation Time: 14 minutes. The patient's level of  consciousness and vital signs were monitored continuously by radiology nursing throughout the procedure under my direct supervision. FLUOROSCOPY TIME:  None COMPLICATIONS: None immediate. PROCEDURE: Informed written consent was obtained from the patient after a thorough discussion of the procedural risks, benefits and alternatives. All questions were addressed. A timeout was performed prior to the initiation of the procedure. Patient was placed prone. Both kidneys were evaluated with ultrasound. Left kidney was selected for biopsy. Left flank was prepped with chlorhexidine and sterile field was created. Skin and soft tissues were anesthetized with 1% lidocaine. Small skin incision was made. Using ultrasound guidance, 16 gauge core biopsy needle was directed into the left kidney lower pole. Core biopsy was obtained and placed in saline. A second core biopsy was obtained using ultrasound guidance from the left kidney lower pole. Second core biopsy was adequate and placed in saline. Bandage placed over the puncture site. FINDINGS: Perinephric edema involving both kidneys prior to the procedure. No significant bleeding or hematoma formation from the left kidney following the core biopsies. Two adequate core biopsies were obtained. IMPRESSION: Ultrasound-guided core biopsy of left kidney lower pole. Electronically Signed   By: Markus Daft M.D.   On: 03/22/2020 10:31   VAS [place] LOWER EXTREMITY VENOUS (DVT)  Result Date: 03/22/2020  Lower Venous DVTStudy Indications: Elevated Ddimer.  Risk Factors: None identified. Comparison Study: No prior studies. Performing Technologist: Oliver Hum RVT  Examination Guidelines: A complete evaluation includes B-mode imaging, spectral Doppler, color Doppler, and power Doppler as needed of all accessible portions of each vessel. Bilateral testing is considered an integral part of a complete examination. Limited examinations for reoccurring indications may be performed as noted. The  reflux portion of the exam is performed with the patient in reverse Trendelenburg.  +---------+---------------+---------+-----------+----------+--------------+ RIGHT    CompressibilityPhasicitySpontaneityPropertiesThrombus Aging +---------+---------------+---------+-----------+----------+--------------+ CFV      Full           Yes      Yes                                 +---------+---------------+---------+-----------+----------+--------------+ SFJ      Full                                                        +---------+---------------+---------+-----------+----------+--------------+ FV Prox  Full                                                        +---------+---------------+---------+-----------+----------+--------------+ FV Mid   Full                                                        +---------+---------------+---------+-----------+----------+--------------+ FV DistalFull                                                        +---------+---------------+---------+-----------+----------+--------------+  PFV      Full                                                        +---------+---------------+---------+-----------+----------+--------------+ POP      Full           Yes      Yes                                 +---------+---------------+---------+-----------+----------+--------------+ PTV      Full                                                        +---------+---------------+---------+-----------+----------+--------------+ PERO     Full                                                        +---------+---------------+---------+-----------+----------+--------------+   +---------+---------------+---------+-----------+----------+--------------+ LEFT     CompressibilityPhasicitySpontaneityPropertiesThrombus Aging +---------+---------------+---------+-----------+----------+--------------+ CFV      Full           Yes       Yes                                 +---------+---------------+---------+-----------+----------+--------------+ SFJ      Full                                                        +---------+---------------+---------+-----------+----------+--------------+ FV Prox  Full                                                        +---------+---------------+---------+-----------+----------+--------------+ FV Mid   Full                                                        +---------+---------------+---------+-----------+----------+--------------+ FV DistalFull                                                        +---------+---------------+---------+-----------+----------+--------------+ PFV      Full                                                        +---------+---------------+---------+-----------+----------+--------------+  POP      Full           Yes      Yes                                 +---------+---------------+---------+-----------+----------+--------------+ PTV      Full                                                        +---------+---------------+---------+-----------+----------+--------------+ PERO     Full                                                        +---------+---------------+---------+-----------+----------+--------------+     Summary: RIGHT: - There is no evidence of deep vein thrombosis in the lower extremity.  - No cystic structure found in the popliteal fossa.  LEFT: - There is no evidence of deep vein thrombosis in the lower extremity.  - No cystic structure found in the popliteal fossa.  *See table(s) above for measurements and observations.    Preliminary    US Abdomen Limited RUQ  Result Date: 03/18/2020 CLINICAL DATA:  Nausea and vomiting EXAM: ULTRASOUND ABDOMEN LIMITED RIGHT UPPER QUADRANT COMPARISON:  CT abdomen and pelvis 03/17/2020 FINDINGS: Gallbladder: Large shadowing calculus within gallbladder 19 mm  diameter. No gallbladder wall thickening, pericholecystic fluid or sonographic Murphy sign. Common bile duct: Diameter: 3 mm, normal Liver: Normal appearance. No mass or nodularity. Portal vein is patent on color Doppler imaging with normal direction of blood flow towards the liver. Other: Trace perihepatic free fluid. IMPRESSION: Cholelithiasis without evidence of cholecystitis. Trace ascites adjacent to liver. Electronically Signed   By: Lavonia Dana M.D.   On: 03/18/2020 13:32    Time Spent in minutes  30   Phillips Climes M.D on 03/22/2020 at 2:00 PM  To page go to [email or web address] - password [facility]

## 2020-03-22 NOTE — Progress Notes (Signed)
OT Cancellation Note  Patient Details Name: Jasmine White MRN: [id number] DOB: 09-22-1946   Cancelled Treatment:    Reason Eval/Treat Not Completed: Active bedrest order;Fatigue/lethargy limiting ability to participate.  Patient just returning from biopsy.  Very confused and nonsensical talk.  Active bedrest orders for next 4 hours.  Will check back later today if time allows.  August Luz, OTR/L   Phylliss Bob 03/22/2020, 10:28 AM

## 2020-03-22 NOTE — Progress Notes (Signed)
Back from dialysis.  Patient alert and oriented in no acute distress.Marland KitchenMarland Kitchen

## 2020-03-22 NOTE — Progress Notes (Signed)
Patient now headed to dialysis.

## 2020-03-22 NOTE — Progress Notes (Signed)
PT Cancellation Note  Patient Details Name: Jasmine White MRN: [id number] DOB: 06-11-1946   Cancelled Treatment:    Reason Eval/Treat Not Completed: Patient at procedure or test/unavailable; patient just coming back from radiology and had HD and biopsy today.  Spouse reports hadn't eaten yet either.  Will cancel for today and attempt again another day.   Reginia Naas 03/22/2020, 5:06 PM  Magda Kiel, PT Acute Rehabilitation Services [phone] Office:[phone] 03/22/2020

## 2020-03-22 NOTE — Progress Notes (Signed)
Patient now being transported to nuclear med for lung scan.  Patient is back off floor.  Patient husband in room waiting her return.

## 2020-03-22 NOTE — Progress Notes (Signed)
Vascular in room at this time

## 2020-03-22 NOTE — Progress Notes (Signed)
Dialysis called Gae Bon) for report so she can go for treatment .   Gave report to Gae Bon and advised patient bp is 140/81  HR 91 RR 17 SPO2: 94 on 1 LPM Nasal Cannula  I advised patient went for her IR biopsy and has been alert and oriented without any complications.  Patient in no acute distress. Advised patient needs to go down to dialysis on tele and Oxygen.

## 2020-03-22 NOTE — Progress Notes (Signed)
Patient off the floor to IR for biopsy.

## 2020-03-23 DIAGNOSIS — R7989 Other specified abnormal findings of blood chemistry: Secondary | ICD-10-CM

## 2020-03-23 LAB — CBC
HCT: 22.9 % — ABNORMAL LOW (ref 36.0–46.0)
Hemoglobin: 7.5 g/dL — ABNORMAL LOW (ref 12.0–15.0)
MCH: 26.9 pg (ref 26.0–34.0)
MCHC: 32.8 g/dL (ref 30.0–36.0)
MCV: 82.1 fL (ref 80.0–100.0)
Platelets: 217 10*3/uL (ref 150–400)
RBC: 2.79 MIL/uL — ABNORMAL LOW (ref 3.87–5.11)
RDW: 14.4 % (ref 11.5–15.5)
WBC: 15.8 10*3/uL — ABNORMAL HIGH (ref 4.0–10.5)
nRBC: 0 % (ref 0.0–0.2)

## 2020-03-23 LAB — RENAL FUNCTION PANEL
Albumin: 3.1 g/dL — ABNORMAL LOW (ref 3.5–5.0)
Anion gap: 10 (ref 5–15)
BUN: 30 mg/dL — ABNORMAL HIGH (ref 8–23)
CO2: 28 mmol/L (ref 22–32)
Calcium: 8.7 mg/dL — ABNORMAL LOW (ref 8.9–10.3)
Chloride: 97 mmol/L — ABNORMAL LOW (ref 98–111)
Creatinine, Ser: 3.32 mg/dL — ABNORMAL HIGH (ref 0.44–1.00)
GFR calc Af Amer: 15 mL/min — ABNORMAL LOW (ref >60)
GFR calc non Af Amer: 13 mL/min — ABNORMAL LOW (ref >60)
Glucose, Bld: 147 mg/dL — ABNORMAL HIGH (ref 70–99)
Phosphorus: 3.7 mg/dL (ref 2.5–4.6)
Potassium: 4.6 mmol/L (ref 3.5–5.1)
Sodium: 135 mmol/L (ref 135–145)

## 2020-03-23 LAB — POCT I-STAT, CHEM 8
BUN: 37 mg/dL — ABNORMAL HIGH (ref 8–23)
Calcium, Ion: 1.18 mmol/L (ref 1.15–1.40)
Chloride: 95 mmol/L — ABNORMAL LOW (ref 98–111)
Creatinine, Ser: 4 mg/dL — ABNORMAL HIGH (ref 0.44–1.00)
Glucose, Bld: 148 mg/dL — ABNORMAL HIGH (ref 70–99)
HCT: 21 % — ABNORMAL LOW (ref 36.0–46.0)
Hemoglobin: 7.1 g/dL — ABNORMAL LOW (ref 12.0–15.0)
Potassium: 3.9 mmol/L (ref 3.5–5.1)
Sodium: 135 mmol/L (ref 135–145)
TCO2: 25 mmol/L (ref 22–32)

## 2020-03-23 LAB — BASIC METABOLIC PANEL
Anion gap: 14 (ref 5–15)
BUN: 35 mg/dL — ABNORMAL HIGH (ref 8–23)
CO2: 25 mmol/L (ref 22–32)
Calcium: 8.7 mg/dL — ABNORMAL LOW (ref 8.9–10.3)
Chloride: 96 mmol/L — ABNORMAL LOW (ref 98–111)
Creatinine, Ser: 3.85 mg/dL — ABNORMAL HIGH (ref 0.44–1.00)
GFR calc Af Amer: 13 mL/min — ABNORMAL LOW (ref >60)
GFR calc non Af Amer: 11 mL/min — ABNORMAL LOW (ref >60)
Glucose, Bld: 158 mg/dL — ABNORMAL HIGH (ref 70–99)
Potassium: 3.9 mmol/L (ref 3.5–5.1)
Sodium: 135 mmol/L (ref 135–145)

## 2020-03-23 MED ORDER — SODIUM CHLORIDE 0.9 % IV SOLN: 100.0000 mL | INTRAVENOUS | Status: DC | PRN

## 2020-03-23 MED ORDER — ACD FORMULA A 0.73-2.45-2.2 GM/100ML VI SOLN
1000.0000 mL | Status: DC
  Administered 2020-03-23: 1000 mL
  Filled 2020-03-23: qty 1000

## 2020-03-23 MED ORDER — ACETAMINOPHEN 325 MG PO TABS
650.0000 mg | ORAL_TABLET | Freq: Four times a day (QID) | ORAL | Status: DC | PRN
  Administered 2020-03-23 – 2020-04-03 (×14): 650 mg via ORAL
  Filled 2020-03-23 (×13): qty 2

## 2020-03-23 MED ORDER — CHLORHEXIDINE GLUCONATE CLOTH 2 % EX PADS
6.0000 | MEDICATED_PAD | Freq: Every day | CUTANEOUS | Status: DC
  Administered 2020-03-25 – 2020-04-10 (×16): 6 via TOPICAL

## 2020-03-23 MED ORDER — ALTEPLASE 2 MG IJ SOLR: 2.0000 mg | Freq: Once | INTRAMUSCULAR | Status: DC | PRN

## 2020-03-23 MED ORDER — DIPHENHYDRAMINE HCL 25 MG PO CAPS: 25.0000 mg | ORAL_CAPSULE | Freq: Four times a day (QID) | ORAL | Status: DC | PRN

## 2020-03-23 MED ORDER — LIDOCAINE-PRILOCAINE 2.5-2.5 % EX CREA: 1.0000 "application " | TOPICAL_CREAM | CUTANEOUS | Status: DC | PRN

## 2020-03-23 MED ORDER — VITAMIN D 25 MCG (1000 UNIT) PO TABS
1000.0000 [IU] | ORAL_TABLET | Freq: Every day | ORAL | Status: DC
  Administered 2020-03-23 – 2020-04-10 (×16): 1000 [IU] via ORAL
  Filled 2020-03-23 (×16): qty 1

## 2020-03-23 MED ORDER — CALCIUM CARBONATE ANTACID 500 MG PO CHEW
CHEWABLE_TABLET | ORAL | Status: AC
  Filled 2020-03-23: qty 2

## 2020-03-23 MED ORDER — CALCIUM CARBONATE ANTACID 500 MG PO CHEW
2.0000 | CHEWABLE_TABLET | ORAL | Status: AC
  Administered 2020-03-23 (×2): 400 mg via ORAL

## 2020-03-23 MED ORDER — FUROSEMIDE 10 MG/ML IJ SOLN
80.0000 mg | Freq: Once | INTRAMUSCULAR | Status: AC
  Administered 2020-03-23: 80 mg via INTRAVENOUS
  Filled 2020-03-23: qty 8

## 2020-03-23 MED ORDER — ACETAMINOPHEN 325 MG PO TABS: 650.0000 mg | ORAL_TABLET | ORAL | Status: DC | PRN

## 2020-03-23 MED ORDER — PENTAFLUOROPROP-TETRAFLUOROETH EX AERO: 1.0000 "application " | INHALATION_SPRAY | CUTANEOUS | Status: DC | PRN

## 2020-03-23 MED ORDER — ACD FORMULA A 0.73-2.45-2.2 GM/100ML VI SOLN
Status: AC
  Filled 2020-03-23: qty 500

## 2020-03-23 MED ORDER — CYCLOPHOSPHAMIDE 25 MG PO CAPS
50.0000 mg | ORAL_CAPSULE | Freq: Every day | ORAL | Status: DC
  Administered 2020-03-23 – 2020-04-10 (×14): 50 mg via ORAL
  Filled 2020-03-23 (×2): qty 2
  Filled 2020-03-23: qty 1
  Filled 2020-03-23 (×17): qty 2

## 2020-03-23 MED ORDER — CALCIUM GLUCONATE-NACL 2-0.675 GM/100ML-% IV SOLN
2.0000 g | Freq: Once | INTRAVENOUS | Status: AC
  Administered 2020-03-23: 2000 mg via INTRAVENOUS
  Filled 2020-03-23 (×2): qty 100

## 2020-03-23 MED ORDER — HYDRALAZINE HCL 50 MG PO TABS
75.0000 mg | ORAL_TABLET | Freq: Three times a day (TID) | ORAL | Status: DC
  Administered 2020-03-23 – 2020-03-24 (×5): 75 mg via ORAL
  Filled 2020-03-23 (×7): qty 1

## 2020-03-23 MED ORDER — ANTICOAGULANT SODIUM CITRATE 4% (200MG/5ML) IV SOLN
5.0000 mL | Freq: Once | Status: AC
  Administered 2020-03-23: 5 mL
  Filled 2020-03-23: qty 5

## 2020-03-23 MED ORDER — HEPARIN SODIUM (PORCINE) 1000 UNIT/ML DIALYSIS: 1000.0000 [IU] | INTRAMUSCULAR | Status: DC | PRN

## 2020-03-23 MED ORDER — HEPARIN SODIUM (PORCINE) 5000 UNIT/ML IJ SOLN
5000.0000 [IU] | Freq: Three times a day (TID) | INTRAMUSCULAR | Status: DC
  Administered 2020-03-23 – 2020-04-10 (×50): 5000 [IU] via SUBCUTANEOUS
  Filled 2020-03-23 (×50): qty 1

## 2020-03-23 MED ORDER — LIDOCAINE HCL (PF) 1 % IJ SOLN: 5.0000 mL | INTRAMUSCULAR | Status: DC | PRN

## 2020-03-23 NOTE — Progress Notes (Signed)
Physical Therapy Treatment Patient Details Name: Jasmine White MRN: [id number] DOB: June 05, 1946 Today's Date: 03/23/2020    History of Present Illness 74 y.o. female with medical history significant of chronic back pain, HTN, hypothyroidism, recently diagnosed CKD, HLD, Hx of COVID infection and recovery with persistent fatigue who presented 03/18/20 with nausea/vomiting x 9 days. Severe hypovolemic hyponatremia, AKI, UTI, HTN    PT Comments    Patient progressing with ambulation this session, but very symptomatic for about 5 minutes prior to improvement in nausea. Likely to have decompensated peripheral vestibular deficit, but needs further testing.  Felt better after resting, but did have some dry heaves.  Progressing with distance and independence.  Currently still recommending CIR due to medical picture and pt needs to be independent, but hopeful she may progress during acute stay to be able to go home.  PT to follow.   Follow Up Recommendations  CIR     Equipment Recommendations  None recommended by PT    Recommendations for Other Services       Precautions / Restrictions Precautions Precautions: Fall Precaution Comments: dizziness/nausea with activity    Mobility  Bed Mobility               General bed mobility comments: up in recliner  Transfers Overall transfer level: Needs assistance Equipment used: Rolling walker (2 wheeled) Transfers: Sit to/from Stand Sit to Stand: Supervision         General transfer comment: UE use pushing up from chair, S for safety  Ambulation/Gait Ambulation/Gait assistance: Min guard;Supervision Gait Distance (Feet): 150 Feet Assistive device: Rolling walker (2 wheeled) Gait Pattern/deviations: Step-through pattern;Decreased stride length     General Gait Details: mostly close S for ambulation due to PT pushing IV pole with bad wheels, but pt managed without LOB and with increased time and minguard on turns   Location manager    Modified Rankin (Stroke Patients Only)       Balance Overall balance assessment: Needs assistance   Sitting balance-Leahy Scale: Good       Standing balance-Leahy Scale: Fair Standing balance comment: static standing no UE support without LOB, needs UE suport for ambulation due to dizziness                            Cognition Arousal/Alertness: Awake/alert Behavior During Therapy: WFL for tasks assessed/performed Overall Cognitive Status: Within Functional Limits for tasks assessed                                        Exercises      General Comments General comments (skin integrity, edema, etc.): c/o dizziness and reports history of vertigo (inner ear) but usually just feeling that can't tell where her feet are, now having some "woozy headedness" BP WNL seated and standing, pt with nausea post ambulation felt due to dizziness symptoms. Could be decompensated peripheral vertigo      Pertinent Vitals/Pain Pain Assessment: No/denies pain    Home Living                      Prior Function            PT Goals (current goals can now be found in the care plan section) Progress towards PT  goals: Progressing toward goals    Frequency    Min 3X/week      PT Plan      Co-evaluation              AM-PAC PT "6 Clicks" Mobility   Outcome Measure  Help needed turning from your back to your side while in a flat bed without using bedrails?: None Help needed moving from lying on your back to sitting on the side of a flat bed without using bedrails?: A Little Help needed moving to and from a bed to a chair (including a wheelchair)?: A Little Help needed standing up from a chair using your arms (e.g., wheelchair or bedside chair)?: None Help needed to walk in hospital room?: A Little Help needed climbing 3-5 steps with a railing? : A Little 6 Click Score: 20    End of Session   Activity  Tolerance: Other (comment) (limited by nausea) Patient left: in chair;with call bell/phone within reach Nurse Communication: Other (comment) (may need nausea medication) PT Visit Diagnosis: Muscle weakness (generalized) (M62.81);Dizziness and giddiness (R42);Other abnormalities of gait and mobility (R26.89)     Time: 5913-6859 PT Time Calculation (min) (ACUTE ONLY): 23 min  Charges:  $Gait Training: 23-37 mins                     Magda Kiel, PT Acute Rehabilitation Services [phone] Office:[phone] 03/23/2020    Reginia Naas 03/23/2020, 1:20 PM

## 2020-03-23 NOTE — Progress Notes (Signed)
Report from [place] in dialysis. Patient finished all her Pheresis today and will be going for both Hemodyalisis and Pheresis tomorrow.  Patient handled very well.

## 2020-03-23 NOTE — Progress Notes (Signed)
PROGRESS NOTE                                                                                                                                                                                                             Patient Demographics:    Al Gagen, is a 74 y.o. female, DOB - 05/05/1946, PVX:[id number]  Admit date - 03/17/2020   Admitting Physician Clance Boll, MD  Outpatient Primary MD for the patient is Chevis Pretty, [place]  LOS - 6  Chief Complaint  Patient presents with  . Emesis       Brief Narrative  -  Jasmine White is a 74 y.o. female with medical history significant of  Chronic back pain HTN, hypothyroidism, recently diagnosed CKD, HLD, Hx of COVID infection and recovery with persistent fatigue who presents with vomiting. Per patient states she has had intermittent n/v that started 9 days ago, in the ER she had changes suggestive of severe constipation and stool burden on CT scan, her blood numbers were suggestive of UTI, severe dehydration, hypernatremia and AKI.  She was admitted for further treatment.   Subjective:   Patient in bed, appears comfortable, reports she is feeling better today, her appetite has improved, denies any abdominal pain, shortness of breath, fever or chills .   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 -Anti-GBM elevated at 137, MPO elevated at 10.3.  Note ANA also positive and anti-dsDNA elevated. -Treated with IV Solu-Medrol 1 g daily x3 daily, she is currently on prednisone . -Started on plasmapheresis, start date 6/29, plan for total of 7 days (trying to alternate with HD dialysis days ) -hemodialysis  started on 6/30, and she was started hemodialysis on 6/29 after tunneled catheter inserted by IR. -Status post renal biopsy 7/1.  Results are  pending -Management per renal  UTI.   - Treated with Rocephin  Hyponatremia -Resolved  Elevated D-dimers -This is most likely in the setting of renal failure, and systematic inflammatory response, venous Dopplers negative, VQ scan is negative as well.  Essential hypertension.  -Mains elevated on metoprolol 100 mg twice daily, and Norvasc 10 mg, I will go ahead and increase her hydralazine to 75 mg oral 3 times daily.  Chronic microcytic anemia.   -Iron, folate and B12 levels are acceptable, received Aranesp 7/1 .  Severe  stool burden and constipation.   -Resolved  Weakness and deconditioning.  PT OT.  Hypothyroidism.  Continue home dose Synthroid, TSH is stable.    Condition - Extremely Guarded  Family Communication  : Discussed with daughter via phone 7/2  Code Status :  Full  Consults  :  None  Procedures  :   CT - 1. Large volume of inspissated fecal material noted throughout the colon. Mild distal colonic thickening versus underdistention of the sigmoid without evidence of inflammation. Correlate for features of constipation. Furthermore, could consider correlation with most recent colonoscopy or outpatient visualization if not recently performed. 2. Similar thickening versus underdistention of the stomach, could reflect gastritis or secondary change given patient's prolonged emesis. 3. Moderately distended urinary bladder with layering air-fluid level. Could reflect recent instrumentation/catheterization, however if urinary symptoms are present, recommend further evaluation with urinalysis to exclude cystitis. 4. Cholelithiasis without evidence of acute cholecystitis. Mild focal thickening at the gallbladder fundus is suspected to reflect adenomyomatosis but could correlate with outpatient right upper quadrant ultrasound. Could perform on acute basis if biliary symptoms are present. 5. Aortic Atherosclerosis (ICD10-I70.0).  PUD Prophylaxis : PPI  Disposition Plan  :     Status is: Inpatient  Remains inpatient appropriate because:IV treatments appropriate due to intensity of illness or inability to take PO   Dispo: The patient is from: Home              Anticipated d/c is to: SNF              Anticipated d/c date is: 3 days              Patient currently is not medically stable to d/c.   DVT Prophylaxis  :   P[place]er heparin  Lab Results  Component Value Date   PLT 217 03/23/2020    Diet :  Diet Order            Diet renal with fluid restriction Fluid restriction: 1200 mL Fluid; Room service appropriate? Yes; Fluid consistency: Thin  Diet effective now                  Inpatient Medications Scheduled Meds: . amLODipine  10 mg Oral Daily  . calcium carbonate  2 tablet Oral Q3H  . Chlorhexidine Gluconate Cloth  6 each Topical Q0600  . Chlorhexidine Gluconate Cloth  6 each Topical Q0600  . darbepoetin (ARANESP) injection - NON-DIALYSIS  40 mcg Subcutaneous Q Thu-1800  . docusate sodium  200 mg Oral BID  . hydrALAZINE  50 mg Oral TID  . levothyroxine  88 mcg Oral Q0600  . metoprolol tartrate  100 mg Oral BID  . pantoprazole  40 mg Oral Daily  . polyethylene glycol  17 g Oral BID  . predniSONE  60 mg Oral Q breakfast   Continuous Infusions: . sodium chloride    . sodium chloride    . anticoagulant sodium citrate    . calcium gluconate 2,000 mg (03/23/20 1304)  . citrate dextrose     PRN Meds:.sodium chloride, sodium chloride, acetaminophen, albuterol, alteplase, diphenhydrAMINE, heparin, lidocaine (PF), lidocaine-prilocaine, [DISCONTINUED] ondansetron **OR** ondansetron (ZOFRAN) IV, pentafluoroprop-tetrafluoroeth, technetium albumin aggregated  Antibiotics  :   Anti-infectives (From admission, onward)   Start     Dose/Rate Route Frequency Ordered Stop   03/20/20 1319  ceFAZolin (ANCEF) 2-4 GM/100ML-% IVPB       Note to Pharmacy: Arlean Hopping   : cabinet override      03/20/20  1319 03/21/20 0129   03/20/20 1100  ceFAZolin (ANCEF)  IVPB 2g/100 mL premix        2 g 200 mL/hr over 30 Minutes Intravenous On call 03/20/20 1040 03/20/20 1506   03/19/20 0000  cefTRIAXone (ROCEPHIN) 1 g in sodium chloride 0.9 % 100 mL IVPB       Note to Pharmacy: 1st dose now   1 g 200 mL/hr over 30 Minutes Intravenous Every 24 hours 03/18/20 0127 03/21/20 0008   03/18/20 0145  cefTRIAXone (ROCEPHIN) 1 g in sodium chloride 0.9 % 100 mL IVPB       Note to Pharmacy: 1st dose now   1 g 200 mL/hr over 30 Minutes Intravenous  Once 03/18/20 0135 03/18/20 0252          Objective:   Vitals:   03/23/20 1256 03/23/20 1312 03/23/20 1327 03/23/20 1342  BP: (!) 162/89 (!) 141/70 (!) 158/85 (!) 151/77  Pulse: 84 86 85 77  Resp: 18 (!) 24 18 (!) 21  Temp: (!) 97.5 F (36.4 C) (!) 97.4 F (36.3 C) (!) 97.3 F (36.3 C) (!) 97.3 F (36.3 C)  TempSrc: Oral Oral Oral Oral  SpO2:      Weight:      Height:        SpO2: 98 % O2 Flow Rate (L/min): 1 L/min  Wt Readings from Last 3 Encounters:  03/23/20 63.7 kg  03/01/20 61.7 kg  09/02/19 60.3 kg     Intake/Output Summary (Last 24 hours) at 03/23/2020 1346 Last data filed at 03/23/2020 0700 Gross per 24 hour  Intake 200 ml  Output 1350 ml  Net -1150 ml     Physical Exam  Awake Alert, Oriented X 3, No new F.N deficits, Normal affect Symmetrical Chest wall movement, Good air movement bilaterally, CTAB RRR,No Gallops,Rubs or new Murmurs, No Parasternal Heave +ve B.Sounds, Abd Soft, No tenderness, No rebound - guarding or rigidity. No Cyanosis, Clubbing or edema, No new Rash or bruise       Data Review:    Recent Labs  Lab 03/19/20 0256 03/19/20 0256 03/20/20 0419 03/21/20 0509 03/22/20 0103 03/23/20 0815 03/23/20 1247  WBC 11.8*  --  11.5* 10.7* 14.0* 15.8*  --   HGB 8.4*   < > 8.1* 8.5* 8.0* 7.5* 7.1*  HCT 23.6*   < > 23.2* 24.7* 22.9* 22.9* 21.0*  PLT 331  --  251 289 281 217  --   MCV 75.9*  --  76.8* 76.7* 78.2* 82.1  --   MCH 27.0  --  26.8 26.4 27.3 26.9  --    MCHC 35.6  --  34.9 34.4 34.9 32.8  --   RDW 13.2  --  13.3 13.6 13.9 14.4  --   LYMPHSABS 0.8  --  0.5* 0.2* 0.3*  --   --   MONOABS 0.6  --  0.4 0.0* 0.3  --   --   EOSABS 0.1  --  0.0 0.0 0.0  --   --   BASOSABS 0.0  --  0.0 0.0 0.0  --   --    < > = values in this interval not displayed.    Recent Labs  Lab 03/17/20 1821 03/17/20 1821 03/17/20 2043 03/17/20 2043 03/18/20 0116 03/18/20 5366 03/18/20 0745 03/18/20 4403 03/18/20 4742 03/18/20 1644 03/19/20 0256 03/19/20 0806 03/20/20 0419 03/20/20 0706 03/20/20 1244 03/20/20 1244 03/21/20 0509 03/21/20 1931 03/22/20 0103 03/23/20 0228 03/23/20 0815 03/23/20 1247  NA 109*   < >  112*   < > 112*   < >  --   --  113*   < > 114*   < > 118*   < > 121*   < > 128*  --  135 135 135 135  K 3.5   < > 3.4*   < > 3.3*   < >  --   --  4.4   < > 4.2   < > 3.6   < > 4.0   < > 3.8  --  3.4* 4.6 3.9 3.9  CL 68*   < > 73*   < > 74*   < >  --   --  74*   < > 77*   < > 83*   < > 85*   < > 93*  --  95* 97* 96* 95*  CO2 21*   < > 20*   < > 21*   < >  --   --  18*   < > 18*   < > 15*   < > 16*  --  18*  --  '28 28 25  '$ --   GLUCOSE 102*   < > 95   < > 89   < >  --   --  87   < > 91   < > 90   < > 100*   < > 170*  --  190* 147* 158* 148*  BUN 118*   < > 116*   < > 117*   < >  --   --  121*   < > 121*   < > 121*   < > 123*   < > 74*  --  41* 30* 35* 37*  CREATININE 7.12*   < > 7.25*   < > 7.59*   < >  --   --  7.81*   < > 8.50*   < > 8.93*   < > 9.38*   < > 6.82*  --  4.54* 3.32* 3.85* 4.00*  CALCIUM 8.7*   < > 8.1*   < > 8.4*   < >  --   --  8.6*   < > 8.4*   < > 8.2*   < > 8.1*  --  8.5*  --  8.8* 8.7* 8.7*  --   AST 19  --   --   --   --   --   --   --   --   --  18  --  20  --   --   --  22  --  23  --   --   --   ALT 13  --   --   --   --   --   --   --   --   --  11  --  13  --   --   --  13  --  14  --   --   --   ALKPHOS 71  --   --   --   --   --   --   --   --   --  60  --  62  --   --   --  70  --  52  --   --   --   BILITOT 0.7  --    --   --   --   --   --   --   --   --  0.5  --  0.4  --   --   --  1.0  --  0.7  --   --   --   ALBUMIN 3.2*   < >  --   --   --   --   --   --  2.7*   < > 2.6*  --  2.4*  --   --   --  2.5*  --  3.0* 3.1*  --   --   MG  --   --   --   --   --   --   --   --   --   --  2.8*  --  2.7*  --   --   --  2.6*  --  2.2  --   --   --   CRP  --   --   --   --   --   --  9.0*  --   --   --  11.4*  --  12.3*  --   --   --  19.2*  --  6.4*  --   --   --   DDIMER  --   --   --   --   --   --   --   --  3.07*  --  3.30*  --  3.65*  --   --   --  7.21*  --  3.62*  --   --   --   PROCALCITON  --   --   --   --   --   --  0.17  --   --   --  0.22  --  0.29  --   --   --   --   --   --   --   --   --   INR  --   --   --   --   --   --   --   --   --   --   --   --   --   --   --   --   --   --  1.2  --   --   --   TSH  --   --  1.209  --  1.093  --   --   --   --   --   --   --   --   --   --   --   --   --   --   --   --   --   HGBA1C  --   --   --   --   --   --   --   --   --   --   --   --   --   --   --   --   --  5.9*  --   --   --   --   BNP  --   --   --   --   --   --   --  530.8*  --   --  1,430.6*  --  2,535.5*  --   --   --  2,263.4*  --  3,320.9*  --   --   --    < > = values in this interval not displayed.    Recent Labs  Lab 03/17/20 1918 03/18/20 0745 03/18/20 6834  03/18/20 0954 03/19/20 0256 03/20/20 0419 03/21/20 0509 03/22/20 0103  CRP  --  9.0*  --   --  11.4* 12.3* 19.2* 6.4*  DDIMER  --   --   --  3.07* 3.30* 3.65* 7.21* 3.62*  BNP  --   --  530.8*  --  1,430.6* 2,535.5* 2,263.4* 3,320.9*  PROCALCITON  --  0.17  --   --  0.22 0.29  --   --   SARSCOV2NAA NEGATIVE  --   --   --   --   --   --   --     ------------------------------------------------------------------------------------------------------------------ No results for input(s): CHOL, HDL, LDLCALC, TRIG, CHOLHDL, LDLDIRECT in the last 72 hours.  Lab Results  Component Value Date   HGBA1C 5.9 (H) 03/21/2020    ------------------------------------------------------------------------------------------------------------------ No results for input(s): TSH, T4TOTAL, T3FREE, THYROIDAB in the last 72 hours.  Invalid input(s): FREET3 ------------------------------------------------------------------------------------------------------------------ No results for input(s): VITAMINB12, FOLATE, FERRITIN, TIBC, IRON, RETICCTPCT in the last 72 hours.  Coagulation profile Recent Labs  Lab 03/22/20 0103  INR 1.2    Recent Labs    03/21/20 0509 03/22/20 0103  DDIMER 7.21* 3.62*    Cardiac Enzymes No results for input(s): CKMB, TROPONINI, MYOGLOBIN in the last 168 hours.  Invalid input(s): CK ------------------------------------------------------------------------------------------------------------------    Component Value Date/Time   BNP 3,320.9 (H) 03/22/2020 0103    Micro Results Recent Results (from the past 240 hour(s))  SARS Coronavirus 2 by RT PCR (hospital order, performed in [facility] hospital lab) Nasopharyngeal Nasopharyngeal Swab     Status: None   Collection Time: 03/17/20  7:18 PM   Specimen: Nasopharyngeal Swab  Result Value Ref Range Status   SARS Coronavirus 2 NEGATIVE NEGATIVE Final    Comment: (NOTE) SARS-CoV-2 target nucleic acids are NOT DETECTED.  The SARS-CoV-2 RNA is generally detectable in upper and lower respiratory specimens during the acute phase of infection. The lowest concentration of SARS-CoV-2 viral copies this assay can detect is 250 copies / mL. A negative result does not preclude SARS-CoV-2 infection and should not be used as the sole basis for treatment or other patient management decisions.  A negative result may occur with improper specimen collection / handling, submission of specimen other than nasopharyngeal swab, presence of viral mutation(s) within the areas targeted by this assay, and inadequate number of viral copies (<250 copies / mL). A  negative result must be combined with clinical observations, patient history, and epidemiological information.  Fact Sheet for Patients:   StrictlyIdeas.no  Fact Sheet for Healthcare Providers: BankingDealers.co.za  This test is not yet approved or  cleared by the [place] FDA and has been authorized for detection and/or diagnosis of SARS-CoV-2 by FDA under an Emergency Use Authorization (EUA).  This EUA will remain in effect (meaning this test can be used) for the duration of the COVID-19 declaration under Section 564(b)(1) of the Act, 21 U.S.C. section 360bbb-3(b)(1), unless the authorization is terminated or revoked sooner.  Performed at [facility], [address] [place], [place] [address]   MRSA PCR Screening     Status: None   Collection Time: 03/17/20 11:47 PM   Specimen: Nasal Mucosa; Nasopharyngeal  Result Value Ref Range Status   MRSA by PCR NEGATIVE NEGATIVE Final    Comment:        The GeneXpert MRSA Assay (FDA approved for NASAL specimens only), is one component of a comprehensive MRSA colonization surveillance program. It is not intended to diagnose MRSA  infection nor to guide or monitor treatment for MRSA infections. Performed at [facility], [place] [address] [place], [place] [address]   Culture, Urine     Status: None   Collection Time: 03/19/20 10:43 AM   Specimen: Urine, Random  Result Value Ref Range Status   Specimen Description URINE, RANDOM  Final   Special Requests NONE  Final   Culture   Final    NO GROWTH Performed at [facility], [place] [address] [place], [place] [address]    Report Status 03/20/2020 FINAL  Final    Radiology Reports CT Abdomen Pelvis Wo Contrast  Result Date: 03/17/2020 CLINICAL DATA:  Vomiting for 1 week, low sella diam, elevated BUN and creatinine EXAM: CT ABDOMEN AND PELVIS WITHOUT CONTRAST TECHNIQUE: Multidetector CT imaging of the abdomen  and pelvis was performed following the standard protocol without IV contrast. COMPARISON:  Chest radiograph 12/26/2014, lumbar radiograph 08/13/2012, MR lumbar spine 06/16/2012 FINDINGS: Lower chest: Scarring and reticular changes noted in the lung bases, left greater than right. No consolidative opacity or pleural effusion. Normal heart size. No pericardial effusion. Tiny benign appearing macrocalcification versus fiducial in the right breast. Hepatobiliary: No visible focal liver lesions. Smooth liver surface contour. Normal hepatic attenuation. Gallbladder is moderately distended. Some focal thickening is noted towards the gallbladder fundus, possible adenomyomatosis. A partially calcified gallstone noted layering dependently as well. No pericholecystic fluid or inflammation. Normal caliber biliary tree without visible calcified intraductal gallstones. Pancreas: Moderate pancreatic atrophy. No inflammation or ductal dilatation. No discernible pancreatic lesions. Spleen: Normal in size without focal abnormality. Adrenals/Urinary Tract: No concerning adrenal lesions. No visible or contour deforming renal lesions. No urolithiasis or hydronephrosis. Urinary bladder is moderately distended with layering air-fluid level. No significant pericholecystic inflammation or urinary bladder wall thickening. Stomach/Bowel: Question some mild thickening versus underdistention of the stomach. Duodenum takes a normal course across the midline abdomen. No small bowel thickening or dilatation. Large volume of inspissated fecal material noted throughout the colon. Question some mild distal colonic thickening versus underdistention of the sigmoid. No adjacent inflammation, free fluid or evidence of perforation. No high-grade bowel obstruction. Vascular/Lymphatic: Atherosclerotic calcifications throughout the abdominal aorta and branch vessels. No aneurysm or ectasia. No enlarged abdominopelvic lymph nodes. Reproductive: Uterus is  surgically absent. No concerning adnexal lesions. Other: No abdominopelvic free fluid or free gas. No bowel containing hernias. Small fat containing umbilical hernia. Musculoskeletal: Prior L3-L5 posterior spinal fusion. No evidence of hardware failure or acute complication is seen. Mild anterior wedging and Schmorl's node at the L1 superior endplate is similar to comparison radiographs. Diffuse discogenic and facet degenerative changes noted throughout the lumbar spine at both instrumented in non instrumented levels. Additional degenerative changes in the hips and pelvis. The osseous structures appear diffusely demineralized which may limit detection of small or nondisplaced fractures. No acute or worrisome osseous lesions. IMPRESSION: 1. Large volume of inspissated fecal material noted throughout the colon. Mild distal colonic thickening versus underdistention of the sigmoid without evidence of inflammation. Correlate for features of constipation. Furthermore, could consider correlation with most recent colonoscopy or outpatient visualization if not recently performed. 2. Similar thickening versus underdistention of the stomach, could reflect gastritis or secondary change given patient's prolonged emesis. 3. Moderately distended urinary bladder with layering air-fluid level. Could reflect recent instrumentation/catheterization, however if urinary symptoms are present, recommend further evaluation with urinalysis to exclude cystitis. 4. Cholelithiasis without evidence of acute cholecystitis. Mild focal thickening at the gallbladder fundus is suspected to  reflect adenomyomatosis but could correlate with outpatient right upper quadrant ultrasound. Could perform on acute basis if biliary symptoms are present. 5. Aortic Atherosclerosis (ICD10-I70.0). Electronically Signed   By: Lovena Le M.D.   On: 03/17/2020 19:37   X-ray chest PA and lateral  Result Date: 03/18/2020 CLINICAL DATA:  Acute renal insufficiency  EXAM: CHEST - 2 VIEW COMPARISON:  December 26, 2014 FINDINGS: No pneumothorax. The cardiomediastinal silhouette is normal. No pulmonary nodules or masses. Mild bibasilar opacities, likely atelectasis. No overt edema. IMPRESSION: Probable mild bibasilar atelectasis. Recommend follow-up to resolution. No other acute abnormalities. Electronically Signed   By: Dorise Bullion III M.D   On: 03/18/2020 08:56   NM Pulmonary Perfusion  Result Date: 03/22/2020 CLINICAL DATA:  Elevated D-dimer level, acute renal failure. EXAM: NUCLEAR MEDICINE PERFUSION LUNG SCAN TECHNIQUE: Perfusion images were obtained in multiple projections after intravenous injection of radiopharmaceutical. Ventilation scans intentionally deferred if perfusion scan and chest x-ray adequate for interpretation during COVID 19 epidemic. RADIOPHARMACEUTICALS:  4.3 mCi Tc-27mMAA IV COMPARISON:  Chest radiograph 03/22/2020 FINDINGS: Small bandlike regions of hypoperfusion in the mid lungs bilaterally, not compelling for wedge-shaped, accordingly not considered of substantial suspicion for acute pulmonary embolus. IMPRESSION: No overtly wedge-shaped perfusion defects. By PISAPED criteria this is typically classified as "pulmonary embolism absent." Electronically Signed   By: WVan ClinesM.D.   On: 03/22/2020 18:33   IR Fluoro Guide CV Line Right  Result Date: 03/20/2020 INDICATION: 74year old female referred for hemodialysis catheter placement EXAM: IMAGE GUIDED PLACEMENT OF TUNNELED HEMODIALYSIS CATHETER MEDICATIONS: 2 g Ancef. The antibiotic was given in an appropriate time interval prior to skin puncture. ANESTHESIA/SEDATION: Moderate (conscious) sedation was employed during this procedure. A total of Versed 0.5 mg and Fentanyl 12.5 mcg was administered intravenously. Moderate Sedation Time: 19 minutes. The patient's level of consciousness and vital signs were monitored continuously by radiology nursing throughout the procedure under my direct  supervision. FLUOROSCOPY TIME:  Fluoroscopy Time: 0 minutes 36 seconds (1 mGy). COMPLICATIONS: None PROCEDURE: Informed written consent was obtained from the patient after a discussion of the risks, benefits, and alternatives to treatment. Questions regarding the procedure were encouraged and answered. The right neck and chest were prepped with chlorhexidine in a sterile fashion, and a sterile drape was applied covering the operative field. Maximum barrier sterile technique with sterile gowns and gloves were used for the procedure. A timeout was performed prior to the initiation of the procedure. Ultrasound survey was performed. Micropuncture kit was utilized to access the right internal jugular vein under direct, real-time ultrasound guidance after the overlying soft tissues were anesthetized with 1% lidocaine with epinephrine. Stab incision was made with 11 blade scalpel. Microwire was passed centrally. The microwire was then marked to measure appropriate internal catheter length. External tunneled length was estimated. A total tip to cuff length of 19 cm was selected. 035 guidewire was advanced to the level of the IVC. Skin and subcutaneous tissues of chest wall below the clavicle were generously infiltrated with 1% lidocaine for local anesthesia. A small stab incision was made with 11 blade scalpel. The selected hemodialysis catheter was tunneled in a retrograde fashion from the anterior chest wall to the venotomy incision. Serial dilation was performed and then a peel-away sheath was placed. The catheter was then placed through the peel-away sheath with tips ultimately positioned within the superior aspect of the right atrium. Final catheter positioning was confirmed and documented with a spot radiographic image. The catheter aspirates and flushes normally.  The catheter was flushed with appropriate volume heparin dwells. The catheter exit site was secured with a 0-Prolene retention suture. Gel-Foam slurry was  infused into the soft tissue tract. The venotomy incision was closed Derma bond and sterile dressing. Dressings were applied at the chest wall. Patient tolerated the procedure well and remained hemodynamically stable throughout. No complications were encountered and no significant blood loss encountered. IMPRESSION: Status post right IJ tunneled hemodialysis catheter placement. Catheter ready for use. Signed, Dulcy Fanny. Dellia Nims, RPVI [facility] Radiology Electronically Signed   By: Corrie Mckusick D.O.   On: 03/20/2020 15:27   IR US Guide Vasc Access Right  Result Date: 03/20/2020 INDICATION: 74 year old female referred for hemodialysis catheter placement EXAM: IMAGE GUIDED PLACEMENT OF TUNNELED HEMODIALYSIS CATHETER MEDICATIONS: 2 g Ancef. The antibiotic was given in an appropriate time interval prior to skin puncture. ANESTHESIA/SEDATION: Moderate (conscious) sedation was employed during this procedure. A total of Versed 0.5 mg and Fentanyl 12.5 mcg was administered intravenously. Moderate Sedation Time: 19 minutes. The patient's level of consciousness and vital signs were monitored continuously by radiology nursing throughout the procedure under my direct supervision. FLUOROSCOPY TIME:  Fluoroscopy Time: 0 minutes 36 seconds (1 mGy). COMPLICATIONS: None PROCEDURE: Informed written consent was obtained from the patient after a discussion of the risks, benefits, and alternatives to treatment. Questions regarding the procedure were encouraged and answered. The right neck and chest were prepped with chlorhexidine in a sterile fashion, and a sterile drape was applied covering the operative field. Maximum barrier sterile technique with sterile gowns and gloves were used for the procedure. A timeout was performed prior to the initiation of the procedure. Ultrasound survey was performed. Micropuncture kit was utilized to access the right internal jugular vein under  direct, real-time ultrasound guidance after the overlying soft tissues were anesthetized with 1% lidocaine with epinephrine. Stab incision was made with 11 blade scalpel. Microwire was passed centrally. The microwire was then marked to measure appropriate internal catheter length. External tunneled length was estimated. A total tip to cuff length of 19 cm was selected. 035 guidewire was advanced to the level of the IVC. Skin and subcutaneous tissues of chest wall below the clavicle were generously infiltrated with 1% lidocaine for local anesthesia. A small stab incision was made with 11 blade scalpel. The selected hemodialysis catheter was tunneled in a retrograde fashion from the anterior chest wall to the venotomy incision. Serial dilation was performed and then a peel-away sheath was placed. The catheter was then placed through the peel-away sheath with tips ultimately positioned within the superior aspect of the right atrium. Final catheter positioning was confirmed and documented with a spot radiographic image. The catheter aspirates and flushes normally. The catheter was flushed with appropriate volume heparin dwells. The catheter exit site was secured with a 0-Prolene retention suture. Gel-Foam slurry was infused into the soft tissue tract. The venotomy incision was closed Derma bond and sterile dressing. Dressings were applied at the chest wall. Patient tolerated the procedure well and remained hemodynamically stable throughout. No complications were encountered and no significant blood loss encountered. IMPRESSION: Status post right IJ tunneled hemodialysis catheter placement. Catheter ready for use. Signed, Dulcy Fanny. Dellia Nims, RPVI [facility] Radiology Electronically Signed   By: Corrie Mckusick D.O.   On: 03/20/2020 15:27   DG Chest Port 1 View  Result Date: 03/22/2020 CLINICAL DATA:  Elevated D-dimer level. EXAM: PORTABLE CHEST 1 VIEW COMPARISON:  Chest  radiograph 03/19/2020 FINDINGS: New left perihilar bilateral infrahilar patchy airspace opacities. Blunting of the costophrenic angles bilaterally. Dialysis catheter tip: Lower SVC. No visible pneumothorax. Mild thoracic spondylosis. IMPRESSION: 1. New left perihilar and infrahilar patchy airspace opacities. This could represent bilateral pneumonia or asymmetric noncardiogenic edema. 2. Small bilateral pleural effusions. 3. New right IJ dialysis catheter tip: SVC.  No pneumothorax. Electronically Signed   By: Van Clines M.D.   On: 03/22/2020 18:34   DG Chest Port 1 View  Result Date: 03/19/2020 CLINICAL DATA:  Shortness of breath. EXAM: PORTABLE CHEST 1 VIEW COMPARISON:  03/18/2020 FINDINGS: The cardiac silhouette, mediastinal and hilar contours are within normal limits and stable. Stable underlying emphysematous changes and pulmonary scarring. Suspect streaky bibasilar infiltrates. No pleural effusions. No worrisome pulmonary lesions. IMPRESSION: 1. Underlying emphysematous changes and pulmonary scarring. 2. Suspect streaky bibasilar infiltrates. Electronically Signed   By: Marijo Sanes M.D.   On: 03/19/2020 07:59   US BIOPSY (KIDNEY)  Result Date: 03/22/2020 INDICATION: 42 year old with acute kidney injury.  Request for renal biopsy. EXAM: ULTRASOUND-GUIDED RANDOM LEFT RENAL BIOPSY MEDICATIONS: None. ANESTHESIA/SEDATION: Moderate (conscious) sedation was employed during this procedure. A total of Versed 1.0 mg and Fentanyl 50 mcg was administered intravenously. Moderate Sedation Time: 14 minutes. The patient's level of consciousness and vital signs were monitored continuously by radiology nursing throughout the procedure under my direct supervision. FLUOROSCOPY TIME:  None COMPLICATIONS: None immediate. PROCEDURE: Informed written consent was obtained from the patient after a thorough discussion of the procedural risks, benefits and alternatives. All questions were addressed. A timeout was  performed prior to the initiation of the procedure. Patient was placed prone. Both kidneys were evaluated with ultrasound. Left kidney was selected for biopsy. Left flank was prepped with chlorhexidine and sterile field was created. Skin and soft tissues were anesthetized with 1% lidocaine. Small skin incision was made. Using ultrasound guidance, 16 gauge core biopsy needle was directed into the left kidney lower pole. Core biopsy was obtained and placed in saline. A second core biopsy was obtained using ultrasound guidance from the left kidney lower pole. Second core biopsy was adequate and placed in saline. Bandage placed over the puncture site. FINDINGS: Perinephric edema involving both kidneys prior to the procedure. No significant bleeding or hematoma formation from the left kidney following the core biopsies. Two adequate core biopsies were obtained. IMPRESSION: Ultrasound-guided core biopsy of left kidney lower pole. Electronically Signed   By: Markus Daft M.D.   On: 03/22/2020 10:31   VAS [place] LOWER EXTREMITY VENOUS (DVT)  Result Date: 03/22/2020  Lower Venous DVTStudy Indications: Elevated Ddimer.  Risk Factors: None identified. Comparison Study: No prior studies. Performing Technologist: Oliver Hum RVT  Examination Guidelines: A complete evaluation includes B-mode imaging, spectral Doppler, color Doppler, and power Doppler as needed of all accessible portions of each vessel. Bilateral testing is considered an integral part of a complete examination. Limited examinations for reoccurring indications may be performed as noted. The reflux portion of the exam is performed with the patient in reverse Trendelenburg.  +---------+---------------+---------+-----------+----------+--------------+ RIGHT    CompressibilityPhasicitySpontaneityPropertiesThrombus Aging +---------+---------------+---------+-----------+----------+--------------+ CFV      Full           Yes      Yes                                  +---------+---------------+---------+-----------+----------+--------------+ SFJ      Full                                                        +---------+---------------+---------+-----------+----------+--------------+  FV Prox  Full                                                        +---------+---------------+---------+-----------+----------+--------------+ FV Mid   Full                                                        +---------+---------------+---------+-----------+----------+--------------+ FV DistalFull                                                        +---------+---------------+---------+-----------+----------+--------------+ PFV      Full                                                        +---------+---------------+---------+-----------+----------+--------------+ POP      Full           Yes      Yes                                 +---------+---------------+---------+-----------+----------+--------------+ PTV      Full                                                        +---------+---------------+---------+-----------+----------+--------------+ PERO     Full                                                        +---------+---------------+---------+-----------+----------+--------------+   +---------+---------------+---------+-----------+----------+--------------+ LEFT     CompressibilityPhasicitySpontaneityPropertiesThrombus Aging +---------+---------------+---------+-----------+----------+--------------+ CFV      Full           Yes      Yes                                 +---------+---------------+---------+-----------+----------+--------------+ SFJ      Full                                                        +---------+---------------+---------+-----------+----------+--------------+ FV Prox  Full                                                         +---------+---------------+---------+-----------+----------+--------------+  FV Mid   Full                                                        +---------+---------------+---------+-----------+----------+--------------+ FV DistalFull                                                        +---------+---------------+---------+-----------+----------+--------------+ PFV      Full                                                        +---------+---------------+---------+-----------+----------+--------------+ POP      Full           Yes      Yes                                 +---------+---------------+---------+-----------+----------+--------------+ PTV      Full                                                        +---------+---------------+---------+-----------+----------+--------------+ PERO     Full                                                        +---------+---------------+---------+-----------+----------+--------------+     Summary: RIGHT: - There is no evidence of deep vein thrombosis in the lower extremity.  - No cystic structure found in the popliteal fossa.  LEFT: - There is no evidence of deep vein thrombosis in the lower extremity.  - No cystic structure found in the popliteal fossa.  *See table(s) above for measurements and observations. Electronically signed by Servando Snare MD on 03/22/2020 at 4:37:23 PM.    Final    US Abdomen Limited RUQ  Result Date: 03/18/2020 CLINICAL DATA:  Nausea and vomiting EXAM: ULTRASOUND ABDOMEN LIMITED RIGHT UPPER QUADRANT COMPARISON:  CT abdomen and pelvis 03/17/2020 FINDINGS: Gallbladder: Large shadowing calculus within gallbladder 19 mm diameter. No gallbladder wall thickening, pericholecystic fluid or sonographic Murphy sign. Common bile duct: Diameter: 3 mm, normal Liver: Normal appearance. No mass or nodularity. Portal vein is patent on color Doppler imaging with normal direction of blood flow towards the liver.  Other: Trace perihepatic free fluid. IMPRESSION: Cholelithiasis without evidence of cholecystitis. Trace ascites adjacent to liver. Electronically Signed   By: Lavonia Dana M.D.   On: 03/18/2020 13:32    Time Spent in minutes  30   Phillips Climes M.D on 03/23/2020 at 1:46 PM  To page go to [email or web address] - password [email or web address]

## 2020-03-23 NOTE — Progress Notes (Signed)
Provider Dr. Waldron Labs in room with patient. Requests that patient sits in chair for the day today.

## 2020-03-23 NOTE — Progress Notes (Signed)
Patient is ambulating in hallway with PT at this time.

## 2020-03-23 NOTE — Progress Notes (Signed)
Patient off the floor for dialysis.

## 2020-03-23 NOTE — Progress Notes (Signed)
Jasmine White called from dialysis for report. I advised patient ambulating in hallway in no acute distress. Advised that pharmacy sent the calcium gluconate up here as well as the anticoagulant sodium citrate solution here.  She advised that it was sent to them as well and that I can put it back for credit.

## 2020-03-23 NOTE — Plan of Care (Signed)
Discussed prelim biopsy results with Dr. Candiss Norse.  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  Discussed biopsy results with patient.  She again denies any hemoptysis but is somewhat short of breath with exertion.   Discussed risks/benefits/indications of cytoxan.  She states that she has felt better after starting immunosuppressive treatment and she does consent to proceed with cytoxan.    Will order cytoxan 50 mg daily (for dosing at 0.8 mg/kg/day as she's on HD or crcl < 15).  Will dose after pheresis on days she gets pheresis.  Spoke with HD charge RN - will now perform daily pheresis x 2 weeks (2-3 weeks is suggested duration - reassess at 2 week mark).  Last tx 7/13 for now but reassess.  Claudia Desanctis, MD 03/23/2020 2:01 PM

## 2020-03-23 NOTE — Discharge Planning (Signed)
Spoke with patient's daughter [name]. [facility] is closest to patient's home. Family will provide transportation. Referral made to [facility] Admissions. Terri Piedra will follow up on Monday, July 5.

## 2020-03-23 NOTE — Progress Notes (Signed)
Patient back on floor from having her Pheresis.

## 2020-03-23 NOTE — Progress Notes (Signed)
Patient foley removed without incident. Patient is resting comfortable in bed and has finished her breakfast tray.

## 2020-03-23 NOTE — Progress Notes (Signed)
Patient upright in chair with Incentive Spirometer.

## 2020-03-23 NOTE — Progress Notes (Signed)
[facility] Progress Note  Name: Jasmine White MRN: [id number] DOB: 1946/07/20  Chief Complaint:  n/v  Subjective:   She had 200 mL UOP charted over 7/1.  Had renal biopsy on 7/1 and then had HD on 7/1 with 1 kg UF.  Feels great today.   Review of systems:     Denies n/v Reports shortness of breath depending on position  denies chest pain  States feels much stronger -------------  Background on consult:  HPI: Pt is a 63F with a PMH sig for HTN, HLD, hypothyroidism, COVID in November who is now seen in consultation at the request of Dr. Candiss Norse for eval and recs re: AKI, hyponatremia.  Pt has had COVID in November and since then hasn't felt right.  Having burning every night in her legs for which she was taking Advil nightly.  Takes hyzaar for HTN.  Previously Cr was 0.78 in Dec 2020.  Went to PCP 03/01/20 and was 2.34 then.  Has had one week of n/v.  Has still been taking her Advil and her hyzaar.  Went to [facility] last night where she was found to have Cr of > 7, BUN 119, and severe hyponatremia at 109. CT scan showed large amount of inspissated stool and possibly thickened sigmoid colon and stomach/ GB.  Was given 1L NS, started on 50 mL/ hr NS, and transferred to [facility] for [facility].  Pt states she's very thirsty right now.  No dizziness/ falls/ seizure like activity.  She has been hypertensive here.  Urine lytes show isosthenuria with urine Na 24.  (? If before or after IV NS bolus).  Na 112 this AM, no real movement in BUN/ Cr.  No HA, f/c, SOB, rashes, nosebleeds, red/ itchy eyes, nasal crusting, coughing up blood, blood in urine/ stool.  Says some foamy urine.    Intake/Output Summary (Last 24 hours) at 03/23/2020 0644 Last data filed at 03/23/2020 8756 Gross per 24 hour  Intake 200 ml  Output 1200 ml  Net -1000 ml    Vitals:  Vitals:   03/22/20 1500 03/22/20 2008 03/22/20 2337 03/23/20 0407  BP: 128/65 (!) 146/69 127/62 (!) 148/77  Pulse: 80 85 72 91  Resp: 16 19  16 20   Temp: 98.6 F (37 C) 98.2 F (36.8 C) 98.8 F (37.1 C) 98 F (36.7 C)  TempSrc: Axillary Oral Oral Oral  SpO2:  96% 97% 93%  Weight:    63.7 kg  Height:         Physical Exam:   General adult female in bed in no acute distress HEENT normocephalic atraumatic extraocular movements intact sclera anicteric Neck supple trachea midline Lungs crackles on auscultation bilaterally normal work of breathing at rest; on 1 liter oxygen Heart S1S2 no rub Abdomen soft nontender nondistended Extremities no pitting edema appreciated Psych normal mood and affect Neuro alert and oriented x 3 provides hx and follows commands  GU - foley catheter in place with urine Access: RIJ tunneled HD catheter in place   Medications reviewed   Labs:  BMP Latest Ref Rng & Units 03/23/2020 03/22/2020 03/21/2020  Glucose 70 - 99 mg/dL 147(H) 190(H) 170(H)  BUN 8 - 23 mg/dL 30(H) 41(H) 74(H)  Creatinine 0.44 - 1.00 mg/dL 3.32(H) 4.54(H) 6.82(H)  BUN/Creat Ratio 12 - 28 - - -  Sodium 135 - 145 mmol/L 135 135 128(L)  Potassium 3.5 - 5.1 mmol/L 4.6 3.4(L) 3.8  Chloride 98 - 111 mmol/L 97(L) 95(L) 93(L)  CO2 22 - 32 mmol/L 28 28 18(L)  Calcium 8.9 - 10.3 mg/dL 8.7(L) 8.8(L) 8.5(L)     Assessment/Plan:   # AKI 2/2 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  Had Cr of 2.3 two weeks prior to presentation then rose to > 7 - 8 in the setting of 1 week of n/v, Advil, and Hyzaar. Work-up has included: Anti-GBM elevated at 137, MPO elevated at 10.3.  Note ANA also positive and anti-dsDNA elevated.   up/c ratio 7430 mg/g.  Hep C nonreactive, hep B negative, HIV nonreactive.  CT with mod distended urinary bladder, no hydro.  PR3 neg. Complement normal. SPEP no M spike. free light chains ratio normal. ASO negative.  HD started on 6/29 after tunneled catheter with IR.  First pheresis on 6/30.   Renal biopsy on 7/1. S/p Solumedrol 1 gram daily x 3 days (started on 6/29)   -------- - Will follow-up with [facility] today re: prelim read for renal biopsy - No acute indication for HD today.  Assess needs daily and tentatively plan for HD on 7/3 then reassess.  Goal to avoid HD and pheresis on same day for the patient if possible - Continue pred 60 mg daily for now  - Continue Pheresis with [facility] - for now plan every other day x 7 treatments.  Pheresis #2 on 7/2. - remove foley catheter and monitor for retention    # Severe hypovolemic hyponatremia: in setting of n/v and HCTZ use as well as profound AKI.  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  S/p Cautious correction to avoid adverse outcome.  - hypothyroidism per primary team    # Proteinuria  - up/c ratio 7430 mg/g with concurrent hematuria - work-up as above    # Metabolic acidosis - 2/2 AKI  - managed with HD   # Constipation - improving; please avoid all Mg containing laxatives/ enemas and fleets enemas.  Per primary   # HTN: holding her home hyzaar with AKI.  Improved control with medication/metoprolol titration back to home dose.  Lasix once on 7/2  # Hyperphosphatemia - renal diet.  Phos normalized after HD.  Will discontinue binder for now and check phos daily    # Possible UTI- though rare bact on the next day's UA and no growth on cx (appears obtained on ceftriaxone). S/p rocephin   # Abnormal CT scan- RUQ [place] with cholelithiasis without evidence of cholecystitis   # Microcytic anemia   - iron, B12, and folate acceptable  - s/p aranesp 40 mcg on 7/1 - update CBC today (post-bx)  # Dispo: continue inpatient monitoring   Claudia Desanctis, MD 03/23/2020  7:04 AM

## 2020-03-23 NOTE — Progress Notes (Signed)
Inpatient Rehab Admissions:  Inpatient Rehab Consult received. Note pt already mobilizing at a very high level (supervision-min guard 150' with a RW).  Will follow over the weekend as pt will likely progress to be able to d/c home with family support, noted in CIR consult dated 7/1.   Signed: Shann Medal, PT, DPT Admissions Coordinator [phone] 03/23/20  2:40 PM

## 2020-03-24 ENCOUNTER — Inpatient Hospital Stay (HOSPITAL_COMMUNITY): Payer: Medicare PPO

## 2020-03-24 LAB — RENAL FUNCTION PANEL
Albumin: 3 g/dL — ABNORMAL LOW (ref 3.5–5.0)
Anion gap: 11 (ref 5–15)
BUN: 50 mg/dL — ABNORMAL HIGH (ref 8–23)
CO2: 30 mmol/L (ref 22–32)
Calcium: 8.9 mg/dL (ref 8.9–10.3)
Chloride: 96 mmol/L — ABNORMAL LOW (ref 98–111)
Creatinine, Ser: 4.68 mg/dL — ABNORMAL HIGH (ref 0.44–1.00)
GFR calc Af Amer: 10 mL/min — ABNORMAL LOW (ref >60)
GFR calc non Af Amer: 9 mL/min — ABNORMAL LOW (ref >60)
Glucose, Bld: 113 mg/dL — ABNORMAL HIGH (ref 70–99)
Phosphorus: 3.8 mg/dL (ref 2.5–4.6)
Potassium: 3.6 mmol/L (ref 3.5–5.1)
Sodium: 137 mmol/L (ref 135–145)

## 2020-03-24 LAB — THERAPEUTIC PLASMA EXCHANGE (BLOOD BANK)
Plasma Exchange: 2976
Plasma volume needed: 2976
Unit division: 0
Unit division: 0
Unit division: 0
Unit division: 0
Unit division: 0
Unit division: 0
Unit division: 0

## 2020-03-24 LAB — CBC
HCT: 21.4 % — ABNORMAL LOW (ref 36.0–46.0)
HCT: 31.8 % — ABNORMAL LOW (ref 36.0–46.0)
Hemoglobin: 7 g/dL — ABNORMAL LOW (ref 12.0–15.0)
Hemoglobin: 10.5 g/dL — ABNORMAL LOW (ref 12.0–15.0)
MCH: 27.3 pg (ref 26.0–34.0)
MCH: 28 pg (ref 26.0–34.0)
MCHC: 32.7 g/dL (ref 30.0–36.0)
MCHC: 33 g/dL (ref 30.0–36.0)
MCV: 83.6 fL (ref 80.0–100.0)
MCV: 84.8 fL (ref 80.0–100.0)
Platelets: 184 10*3/uL (ref 150–400)
Platelets: 198 10*3/uL (ref 150–400)
RBC: 2.56 MIL/uL — ABNORMAL LOW (ref 3.87–5.11)
RBC: 3.75 MIL/uL — ABNORMAL LOW (ref 3.87–5.11)
RDW: 14.4 % (ref 11.5–15.5)
RDW: 14 % (ref 11.5–15.5)
WBC: 16.1 10*3/uL — ABNORMAL HIGH (ref 4.0–10.5)
WBC: 18.8 10*3/uL — ABNORMAL HIGH (ref 4.0–10.5)
nRBC: 0 % (ref 0.0–0.2)
nRBC: 0 % (ref 0.0–0.2)

## 2020-03-24 LAB — PREPARE RBC (CROSSMATCH)

## 2020-03-24 MED ORDER — ACD FORMULA A 0.73-2.45-2.2 GM/100ML VI SOLN
1000.0000 mL | Status: DC
  Administered 2020-03-24: 1000 mL

## 2020-03-24 MED ORDER — SODIUM CHLORIDE 0.9% IV SOLUTION: Freq: Once | INTRAVENOUS | Status: DC

## 2020-03-24 MED ORDER — CALCIUM CARBONATE ANTACID 500 MG PO CHEW
2.0000 | CHEWABLE_TABLET | ORAL | Status: DC
  Administered 2020-03-24: 400 mg via ORAL

## 2020-03-24 MED ORDER — ACETAMINOPHEN 325 MG PO TABS: 650.0000 mg | ORAL_TABLET | ORAL | Status: DC | PRN

## 2020-03-24 MED ORDER — CALCIUM GLUCONATE-NACL 2-0.675 GM/100ML-% IV SOLN
2.0000 g | Freq: Once | INTRAVENOUS | Status: DC
  Administered 2020-03-24: 2000 mg via INTRAVENOUS
  Filled 2020-03-24: qty 100

## 2020-03-24 MED ORDER — CALCIUM CARBONATE ANTACID 500 MG PO CHEW
CHEWABLE_TABLET | ORAL | Status: AC
  Filled 2020-03-24: qty 2

## 2020-03-24 MED ORDER — DIPHENHYDRAMINE HCL 25 MG PO CAPS: 25.0000 mg | ORAL_CAPSULE | Freq: Four times a day (QID) | ORAL | Status: DC | PRN

## 2020-03-24 MED ORDER — ANTICOAGULANT SODIUM CITRATE 4% (200MG/5ML) IV SOLN
5.0000 mL | Freq: Once | Status: AC
  Administered 2020-03-24: 5 mL
  Filled 2020-03-24: qty 5

## 2020-03-24 MED ORDER — ACD FORMULA A 0.73-2.45-2.2 GM/100ML VI SOLN
Status: AC
  Filled 2020-03-24: qty 500

## 2020-03-24 NOTE — Progress Notes (Signed)
[facility] Progress Note  Name: Jasmine White MRN: [id number] DOB: 10-14-1945  Chief Complaint:  n/v  Subjective:   Urine output was never charted for 7/2.  foley was removed on 7/2.  Had HD on 7/1 with 1 kg UF and had pheresis yesterday, 7/2. Seen and examined on HD on 7/3 at 8:15 am.  Procedure supervised.  Tolerating UF.  BF acceptable. 178/95 on HD. RIJ tunneled catheter in use.  Discussed risks/benefits/indications for blood transfusion and she consents to blood products.     Review of systems:     Denies n/v   Reports shortness of breath  denies chest pain  -------------  Background on consult:  HPI: Pt is a 79F with a PMH sig for HTN, HLD, hypothyroidism, COVID in November who is now seen in consultation at the request of Dr. Candiss Norse for eval and recs re: AKI, hyponatremia.  Pt has had COVID in November and since then hasn't felt right.  Having burning every night in her legs for which she was taking Advil nightly.  Takes hyzaar for HTN.  Previously Cr was 0.78 in Dec 2020.  Went to PCP 03/01/20 and was 2.34 then.  Has had one week of n/v.  Has still been taking her Advil and her hyzaar.  Went to [facility] last night where she was found to have Cr of > 7, BUN 119, and severe hyponatremia at 109. CT scan showed large amount of inspissated stool and possibly thickened sigmoid colon and stomach/ GB.  Was given 1L NS, started on 50 mL/ hr NS, and transferred to [facility] for admission.  Pt states she's very thirsty right now.  No dizziness/ falls/ seizure like activity.  She has been hypertensive here.  Urine lytes show isosthenuria with urine Na 24.  (? If before or after IV NS bolus).  Na 112 this AM, no real movement in BUN/ Cr.  No HA, f/c, SOB, rashes, nosebleeds, red/ itchy eyes, nasal crusting, coughing up blood, blood in urine/ stool.  Says some foamy urine.   No intake or output data in the 24 hours ending 03/24/20 0805  Vitals:  Vitals:   03/24/20 0328 03/24/20 0715  03/24/20 0720 03/24/20 0730  BP: 127/81 (!) 182/87 (!) 183/89 (!) 178/91  Pulse: 72 88 84 82  Resp: 13 18 (!) 21 20  Temp: 98 F (36.7 C) 98.1 F (36.7 C)    TempSrc: Oral Oral    SpO2: 90% 100%    Weight: 63.5 kg 63.4 kg    Height:         Physical Exam:   General adult female in bed in no acute distress HEENT normocephalic atraumatic extraocular movements intact sclera anicteric Neck supple trachea midline Lungs crackles on auscultation bilaterally normal work of breathing at rest Heart S1S2 no rub Abdomen soft nontender nondistended Extremities no pitting edema appreciated Psych normal mood and affect Neuro alert and oriented x 3 provides hx and follows commands  GU - no foley Access: RIJ tunneled HD catheter in place   Medications reviewed   Labs:  BMP Latest Ref Rng & Units 03/24/2020 03/23/2020 03/23/2020  Glucose 70 - 99 mg/dL 113(H) 148(H) 158(H)  BUN 8 - 23 mg/dL 50(H) 37(H) 35(H)  Creatinine 0.44 - 1.00 mg/dL 4.68(H) 4.00(H) 3.85(H)  BUN/Creat Ratio 12 - 28 - - -  Sodium 135 - 145 mmol/L 137 135 135  Potassium 3.5 - 5.1 mmol/L 3.6 3.9 3.9  Chloride 98 - 111 mmol/L 96(L)  95(L) 96(L)  CO2 22 - 32 mmol/L 30 - 25  Calcium 8.9 - 10.3 mg/dL 8.9 - 8.7(L)     Assessment/Plan:   # AKI 2/2 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  Had Cr of 2.3 two weeks prior to presentation then rose to > 7 - 8 in the setting of 1 week of n/v, Advil, and Hyzaar. Work-up has included: Anti-GBM elevated at 137, MPO elevated at 10.3.  Note ANA also positive and anti-dsDNA elevated.   up/c ratio 7430 mg/g.  Hep C nonreactive, hep B negative, HIV nonreactive.  CT with mod distended urinary bladder, no hydro.  PR3 neg. Complement normal. SPEP no M spike. free light chains ratio normal. ASO negative.  HD started on 6/29 after tunneled catheter with IR.  First pheresis on 6/30.   Renal biopsy on 7/1 with Anti-GBM disease with nearly 100% crescents and  significant amount of ATN. S/p Solumedrol 1 gram daily x 3 days (started on 6/29)  -------- - HD today, 7/3.  Assess dialysis needs daily.  Will tentatively transition to TTS schedule as needed for HD.  Spoke with HD SW on 7/2 and CLIP process initiated - note they identified a [facility] unit as closest to the patient  - Continue pred 60 mg daily  - continue cxytoxan 50 mg daily (for dosing at 0.8 mg/kg/day as she's on HD or crcl < 15).  Will dose after pheresis on days she gets pheresis. - Continue Pheresis with FFP - now increased to daily pheresis.  - check bladder scan and replace foley if over 250 ml - Assess bactrim for ppx; on ppi and on vit d daily    - spoke with team and they are getting CT chest today with her dyspnea to assess for alveolar hemorrhage  # Severe hypovolemic hyponatremia: in setting of n/v and HCTZ use as well as profound AKI.  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  S/p Cautious correction to avoid adverse outcome.  - resolved - hypothyroidism per primary team    # Proteinuria 2/2 anti-GBM - up/c ratio 7430 mg/g with concurrent hematuria - work-up as above    # Metabolic acidosis - 2/2 AKI  - managed with HD   # Constipation - improving; please avoid all Mg containing laxatives/ enemas and fleets enemas.  Per primary   # HTN: holding her home hyzaar with AKI.  Improved control with medication/metoprolol titration back to home dose.    # Metabolic bone disease - renal diet.  Phos normalized after HD.  Discontinue binder for now.  Obtain intact PTH. On vit D 1000 units daily for ppx  # Possible UTI- though rare bact on the next day's UA and no growth on cx (appears obtained on ceftriaxone). S/p rocephin   # Abnormal CT scan- RUQ [place] with cholelithiasis without evidence of cholecystitis   # Microcytic anemia   - iron, B12, and folate acceptable  - s/p aranesp 40 mcg on 7/1 - PRBC's x 1 unit on 7/3    # pre-diabetes - noted on  work-up above.  Steroids for GN - given 100% crescents assess titration per her response  # Dispo: continue inpatient monitoring   Claudia Desanctis, MD 03/24/2020  8:35 AM

## 2020-03-24 NOTE — Progress Notes (Signed)
PROGRESS NOTE                                                                                                                                                                                                             Patient Demographics:    Jasmine White, is a 74 y.o. female, DOB - 1946/06/27, AHF:[id number]  Admit date - 03/17/2020   Admitting Physician Clance Boll, MD  Outpatient Primary MD for the patient is Chevis Pretty, FNP  LOS - 7  Chief Complaint  Patient presents with   Emesis       Brief Narrative  -  Jasmine White is a 74 y.o. female with medical history significant of  Chronic back pain HTN, hypothyroidism, recently diagnosed CKD, HLD, Hx of COVID infection and recovery with persistent fatigue who presents with vomiting. Per patient states she has had intermittent n/v that started 9 days ago, in the ER she had changes suggestive of severe constipation and stool burden on CT scan, her blood numbers were suggestive of UTI, severe dehydration, hypernatremia and AKI.  She was admitted for further treatment.   Subjective:   Patient receiving dialysis, reports she was dyspneic earlier today, but this has significantly improved after receiving hemodialysis and receiving 1 unit PRBC.   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 -Anti-GBM elevated at 137, MPO elevated at 10.3.  Note ANA also positive and anti-dsDNA elevated. -Treated with IV Solu-Medrol 1 g daily x3 daily, she is currently on prednisone . -Started on plasmapheresis, start date 6/29, plan for total of 7 days (trying to alternate with HD dialysis days ) -Hemodialysis  started on 6/30, and she was started hemodialysis on 6/29 after tunneled catheter inserted by IR. -Status post renal biopsy 7/1. 29 after tunneled catheter  with IR.  First pheresis on 6/30.   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 -Management per renal -Patient with some dyspnea earlier today, will obtain CT chest without contrast to evaluate for alveolar hemorrhage, but she denies any hemoptysis.  UTI.   - Treated with Rocephin  Hyponatremia -Resolved  Elevated D-dimers -This is most likely in the setting of renal failure, and systematic inflammatory response, venous Dopplers negative, VQ scan is negative as well.  Essential hypertension.  -Mains elevated on  metoprolol 100 mg twice daily, and Norvasc 10 mg, I will go ahead and increase her hydralazine to 75 mg oral 3 times daily.  Chronic microcytic anemia.   -Iron, folate and B12 levels are acceptable, received Aranesp 7/1  -Bleeding 7 this morning, she received 1 unit PRBC transfusion 7/3.  Severe stool burden and constipation.   -Resolved  Weakness and deconditioning.  PT OT.  Hypothyroidism.  Continue home dose Synthroid, TSH is stable.    Condition - Extremely Guarded  Family Communication  : Discussed with daughter via phone 7/3  Code Status :  Full  Consults  :  None  Procedures  :   CT - 1. Large volume of inspissated fecal material noted throughout the colon. Mild distal colonic thickening versus underdistention of the sigmoid without evidence of inflammation. Correlate for features of constipation. Furthermore, could consider correlation with most recent colonoscopy or outpatient visualization if not recently performed. 2. Similar thickening versus underdistention of the stomach, could reflect gastritis or secondary change given patient's prolonged emesis. 3. Moderately distended urinary bladder with layering air-fluid level. Could reflect recent instrumentation/catheterization, however if urinary symptoms are present, recommend further evaluation with urinalysis to exclude cystitis. 4. Cholelithiasis without  evidence of acute cholecystitis. Mild focal thickening at the gallbladder fundus is suspected to reflect adenomyomatosis but could correlate with outpatient right upper quadrant ultrasound. Could perform on acute basis if biliary symptoms are present. 5. Aortic Atherosclerosis (ICD10-I70.0).  PUD Prophylaxis : PPI  Disposition Plan  :    Status is: Inpatient  Remains inpatient appropriate because:IV treatments appropriate due to intensity of illness or inability to take PO   Dispo: The patient is from: Home              Anticipated d/c is to: SNF              Anticipated d/c date is: 3 days              Patient currently is not medically stable to d/c.   DVT Prophylaxis  :   Randall heparin  Lab Results  Component Value Date   PLT 184 03/24/2020    Diet :  Diet Order            Diet renal with fluid restriction Fluid restriction: 1200 mL Fluid; Room service appropriate? Yes; Fluid consistency: Thin  Diet effective now                  Inpatient Medications Scheduled Meds:  sodium chloride   Intravenous Once   amLODipine  10 mg Oral Daily   calcium carbonate       Chlorhexidine Gluconate Cloth  6 each Topical Q0600   Chlorhexidine Gluconate Cloth  6 each Topical Q0600   Chlorhexidine Gluconate Cloth  6 each Topical Q0600   cholecalciferol  1,000 Units Oral Daily   cyclophosphamide  50 mg Oral q1800   darbepoetin (ARANESP) injection - NON-DIALYSIS  40 mcg Subcutaneous Q Thu-1800   docusate sodium  200 mg Oral BID   heparin injection (subcutaneous)  5,000 Units Subcutaneous Q8H   hydrALAZINE  75 mg Oral TID   levothyroxine  88 mcg Oral Q0600   metoprolol tartrate  100 mg Oral BID   pantoprazole  40 mg Oral Daily   polyethylene glycol  17 g Oral BID   predniSONE  60 mg Oral Q breakfast   Continuous Infusions:  PRN Meds:.acetaminophen, albuterol, [DISCONTINUED] ondansetron **OR** ondansetron (ZOFRAN) IV, technetium albumin aggregated  Antibiotics  :     Anti-infectives (From admission, onward)   Start     Dose/Rate Route Frequency Ordered Stop   03/20/20 1319  ceFAZolin (ANCEF) 2-4 GM/100ML-% IVPB       Note to Pharmacy: Arlean Hopping   : cabinet override      03/20/20 1319 03/21/20 0129   03/20/20 1100  ceFAZolin (ANCEF) IVPB 2g/100 mL premix        2 g 200 mL/hr over 30 Minutes Intravenous On call 03/20/20 1040 03/20/20 1506   03/19/20 0000  cefTRIAXone (ROCEPHIN) 1 g in sodium chloride 0.9 % 100 mL IVPB       Note to Pharmacy: 1st dose now   1 g 200 mL/hr over 30 Minutes Intravenous Every 24 hours 03/18/20 0127 03/21/20 0008   03/18/20 0145  cefTRIAXone (ROCEPHIN) 1 g in sodium chloride 0.9 % 100 mL IVPB       Note to Pharmacy: 1st dose now   1 g 200 mL/hr over 30 Minutes Intravenous  Once 03/18/20 0135 03/18/20 0252          Objective:   Vitals:   03/24/20 1535 03/24/20 1553 03/24/20 1610 03/24/20 1623  BP: 140/71 (!) 142/72 (!) 146/72 (!) 145/71  Pulse: 78 79 81 78  Resp: 19 (!) 21 19 (!) 21  Temp: 98.6 F (37 C) 98.9 F (37.2 C) 98.9 F (37.2 C) (!) 97.5 F (36.4 C)  TempSrc: Oral Oral Oral Oral  SpO2:      Weight:      Height:        SpO2: 94 % O2 Flow Rate (L/min): 2 L/min  Wt Readings from Last 3 Encounters:  03/24/20 61 kg  03/01/20 61.7 kg  09/02/19 60.3 kg     Intake/Output Summary (Last 24 hours) at 03/24/2020 1632 Last data filed at 03/24/2020 1101 Gross per 24 hour  Intake --  Output 2550 ml  Net -2550 ml     Physical Exam  Awake Alert, Oriented X 3, No new F.N deficits, Normal affect Symmetrical Chest wall movement, Good air movement bilaterally, scattered rales RRR,No Gallops,Rubs or new Murmurs, No Parasternal Heave +ve B.Sounds, Abd Soft, No tenderness, No rebound - guarding or rigidity. No Cyanosis, Clubbing or edema, No new Rash or bruise        Data Review:    Recent Labs  Lab 03/19/20 0256 03/19/20 0256 03/20/20 0419 03/20/20 0419 03/21/20 0509 03/22/20 0103  03/23/20 0815 03/23/20 1247 03/24/20 0226  WBC 11.8*   < > 11.5*  --  10.7* 14.0* 15.8*  --  16.1*  HGB 8.4*   < > 8.1*   < > 8.5* 8.0* 7.5* 7.1* 7.0*  HCT 23.6*   < > 23.2*   < > 24.7* 22.9* 22.9* 21.0* 21.4*  PLT 331   < > 251  --  289 281 217  --  184  MCV 75.9*   < > 76.8*  --  76.7* 78.2* 82.1  --  83.6  MCH 27.0   < > 26.8  --  26.4 27.3 26.9  --  27.3  MCHC 35.6   < > 34.9  --  34.4 34.9 32.8  --  32.7  RDW 13.2   < > 13.3  --  13.6 13.9 14.4  --  14.4  LYMPHSABS 0.8  --  0.5*  --  0.2* 0.3*  --   --   --   MONOABS 0.6  --  0.4  --  0.0* 0.3  --   --   --   EOSABS 0.1  --  0.0  --  0.0 0.0  --   --   --   BASOSABS 0.0  --  0.0  --  0.0 0.0  --   --   --    < > = values in this interval not displayed.    Recent Labs  Lab 03/17/20 1821 03/17/20 1821 03/17/20 2043 03/17/20 2043 03/18/20 0116 03/18/20 0605 03/18/20 0745 03/18/20 0748 03/18/20 0954 03/18/20 1644 03/19/20 0256 03/19/20 0806 03/20/20 0419 03/20/20 0706 03/21/20 0509 03/21/20 0509 03/21/20 1931 03/22/20 0103 03/23/20 0228 03/23/20 0815 03/23/20 1247 03/24/20 0226  NA 109*   < > 112*   < > 112*   < >  --   --  113*   < > 114*   < > 118*   < > 128*   < >  --  135 135 135 135 137  K 3.5   < > 3.4*   < > 3.3*   < >  --   --  4.4   < > 4.2   < > 3.6   < > 3.8   < >  --  3.4* 4.6 3.9 3.9 3.6  CL 68*   < > 73*   < > 74*   < >  --   --  74*   < > 77*   < > 83*   < > 93*   < >  --  95* 97* 96* 95* 96*  CO2 21*   < > 20*   < > 21*   < >  --   --  18*   < > 18*   < > 15*   < > 18*  --   --  _0 --  30  GLUCOSE 102*   < > 95   < > 89   < >  --   --  87   < > 91   < > 90   < > 170*   < >  --  190* 147* 158* 148* 113*  BUN 118*   < > 116*   < > 117*   < >  --   --  121*   < > 121*   < > 121*   < > 74*   < >  --  41* 30* 35* 37* 50*  CREATININE 7.12*   < > 7.25*   < > 7.59*   < >  --   --  7.81*   < > 8.50*   < > 8.93*   < > 6.82*   < >  --  4.54* 3.32* 3.85* 4.00* 4.68*  CALCIUM 8.7*   < > 8.1*   < > 8.4*   <  >  --   --  8.6*   < > 8.4*   < > 8.2*   < > 8.5*  --   --  8.8* 8.7* 8.7*  --  8.9  AST 19  --   --   --   --   --   --   --   --   --  18  --  20  --  22  --   --  23  --   --   --   --   ALT 13  --   --   --   --   --   --   --   --   --  11  --  13  --  13  --   --  14  --   --   --   --   ALKPHOS 71  --   --   --   --   --   --   --   --   --  60  --  62  --  70  --   --  52  --   --   --   --   BILITOT 0.7  --   --   --   --   --   --   --   --   --  0.5  --  0.4  --  1.0  --   --  0.7  --   --   --   --   ALBUMIN 3.2*   < >  --   --   --   --   --   --  2.7*   < > 2.6*  --  2.4*  --  2.5*  --   --  3.0* 3.1*  --   --  3.0*  MG  --   --   --   --   --   --   --   --   --   --  2.8*  --  2.7*  --  2.6*  --   --  2.2  --   --   --   --   CRP  --   --   --   --   --   --  9.0*  --   --   --  11.4*  --  12.3*  --  19.2*  --   --  6.4*  --   --   --   --   DDIMER  --   --   --   --   --   --   --   --  3.07*  --  3.30*  --  3.65*  --  7.21*  --   --  3.62*  --   --   --   --   PROCALCITON  --   --   --   --   --   --  0.17  --   --   --  0.22  --  0.29  --   --   --   --   --   --   --   --   --   INR  --   --   --   --   --   --   --   --   --   --   --   --   --   --   --   --   --  1.2  --   --   --   --   TSH  --   --  1.209  --  1.093  --   --   --   --   --   --   --   --   --   --   --   --   --   --   --   --   --   HGBA1C  --   --   --   --   --   --   --   --   --   --   --   --   --   --   --   --  5.9*  --   --   --   --   --   BNP  --   --   --   --   --   --   --  530.8*  --   --  1,430.6*  --  2,535.5*  --  2,263.4*  --   --  3,320.9*  --   --   --   --    < > = values in this interval not displayed.    Recent Labs  Lab 03/17/20 1918 03/18/20 0745 03/18/20 0748 03/18/20 0954 03/19/20 0256 03/20/20 0419 03/21/20 0509 03/22/20 0103  CRP  --  9.0*  --   --  11.4* 12.3* 19.2* 6.4*  DDIMER  --   --   --  3.07* 3.30* 3.65* 7.21* 3.62*  BNP  --   --  530.8*  --  1,430.6*  2,535.5* 2,263.4* 3,320.9*  PROCALCITON  --  0.17  --   --  0.22 0.29  --   --   SARSCOV2NAA NEGATIVE  --   --   --   --   --   --   --     ------------------------------------------------------------------------------------------------------------------ No results for input(s): CHOL, HDL, LDLCALC, TRIG, CHOLHDL, LDLDIRECT in the last 72 hours.  Lab Results  Component Value Date   HGBA1C 5.9 (H) 03/21/2020   ------------------------------------------------------------------------------------------------------------------ No results for input(s): TSH, T4TOTAL, T3FREE, THYROIDAB in the last 72 hours.  Invalid input(s): FREET3 ------------------------------------------------------------------------------------------------------------------ No results for input(s): VITAMINB12, FOLATE, FERRITIN, TIBC, IRON, RETICCTPCT in the last 72 hours.  Coagulation profile Recent Labs  Lab 03/22/20 0103  INR 1.2    Recent Labs    03/22/20 0103  DDIMER 3.62*    Cardiac Enzymes No results for input(s): CKMB, TROPONINI, MYOGLOBIN in the last 168 hours.  Invalid input(s): CK ------------------------------------------------------------------------------------------------------------------    Component Value Date/Time   BNP 3,320.9 (H) 03/22/2020 0103    Micro Results Recent Results (from the past 240 hour(s))  SARS Coronavirus 2 by RT PCR (hospital order, performed in [facility] hospital lab) Nasopharyngeal Nasopharyngeal Swab     Status: None   Collection Time: 03/17/20  7:18 PM   Specimen: Nasopharyngeal Swab  Result Value Ref Range Status   SARS Coronavirus 2 NEGATIVE NEGATIVE Final    Comment: (NOTE) SARS-CoV-2 target nucleic acids are NOT DETECTED.  The SARS-CoV-2 RNA is generally detectable in upper and lower respiratory specimens during the acute phase of infection. The lowest concentration of SARS-CoV-2 viral copies this assay can detect is 250 copies / mL. A negative result  does not preclude SARS-CoV-2 infection and should not be used as the sole basis for treatment or other patient management decisions.  A negative result may occur with improper specimen collection / handling, submission of specimen other than nasopharyngeal swab, presence of viral mutation(s) within the areas targeted by this assay, and inadequate number of viral copies (<250 copies / mL). A negative result must be combined with clinical observations, patient history, and epidemiological information.  Fact Sheet for Patients:   StrictlyIdeas.no  Fact Sheet for Healthcare Providers: BankingDealers.co.za  This test is not yet approved or  cleared by the [place] FDA and has been authorized for detection and/or diagnosis of SARS-CoV-2 by FDA under an Emergency Use Authorization (EUA).  This EUA will remain in effect (meaning this test can be used) for the duration of the COVID-19 declaration under Section 564(b)(1) of the Act, 21 U.S.C. section 360bbb-3(b)(1), unless the authorization is  terminated or revoked sooner.  Performed at [facility], [address] [place], [place] [address]   MRSA PCR Screening     Status: None   Collection Time: 03/17/20 11:47 PM   Specimen: Nasal Mucosa; Nasopharyngeal  Result Value Ref Range Status   MRSA by PCR NEGATIVE NEGATIVE Final    Comment:        The GeneXpert MRSA Assay (FDA approved for NASAL specimens only), is one component of a comprehensive MRSA colonization surveillance program. It is not intended to diagnose MRSA infection nor to guide or monitor treatment for MRSA infections. Performed at [facility], [place] [address] [place], [place] [address]   Culture, Urine     Status: None   Collection Time: 03/19/20 10:43 AM   Specimen: Urine, Random  Result Value Ref Range Status   Specimen Description URINE, RANDOM  Final   Special Requests NONE  Final   Culture    Final    NO GROWTH Performed at [facility], [place] [address], [place], [place] [address]    Report Status 03/20/2020 FINAL  Final    Radiology Reports CT Abdomen Pelvis Wo Contrast  Result Date: 03/17/2020 CLINICAL DATA:  Vomiting for 1 week, low sella diam, elevated BUN and creatinine EXAM: CT ABDOMEN AND PELVIS WITHOUT CONTRAST TECHNIQUE: Multidetector CT imaging of the abdomen and pelvis was performed following the standard protocol without IV contrast. COMPARISON:  Chest radiograph 12/26/2014, lumbar radiograph 08/13/2012, MR lumbar spine 06/16/2012 FINDINGS: Lower chest: Scarring and reticular changes noted in the lung bases, left greater than right. No consolidative opacity or pleural effusion. Normal heart size. No pericardial effusion. Tiny benign appearing macrocalcification versus fiducial in the right breast. Hepatobiliary: No visible focal liver lesions. Smooth liver surface contour. Normal hepatic attenuation. Gallbladder is moderately distended. Some focal thickening is noted towards the gallbladder fundus, possible adenomyomatosis. A partially calcified gallstone noted layering dependently as well. No pericholecystic fluid or inflammation. Normal caliber biliary tree without visible calcified intraductal gallstones. Pancreas: Moderate pancreatic atrophy. No inflammation or ductal dilatation. No discernible pancreatic lesions. Spleen: Normal in size without focal abnormality. Adrenals/Urinary Tract: No concerning adrenal lesions. No visible or contour deforming renal lesions. No urolithiasis or hydronephrosis. Urinary bladder is moderately distended with layering air-fluid level. No significant pericholecystic inflammation or urinary bladder wall thickening. Stomach/Bowel: Question some mild thickening versus underdistention of the stomach. Duodenum takes a normal course across the midline abdomen. No small bowel thickening or dilatation. Large volume of inspissated fecal material noted  throughout the colon. Question some mild distal colonic thickening versus underdistention of the sigmoid. No adjacent inflammation, free fluid or evidence of perforation. No high-grade bowel obstruction. Vascular/Lymphatic: Atherosclerotic calcifications throughout the abdominal aorta and branch vessels. No aneurysm or ectasia. No enlarged abdominopelvic lymph nodes. Reproductive: Uterus is surgically absent. No concerning adnexal lesions. Other: No abdominopelvic free fluid or free gas. No bowel containing hernias. Small fat containing umbilical hernia. Musculoskeletal: Prior L3-L5 posterior spinal fusion. No evidence of hardware failure or acute complication is seen. Mild anterior wedging and Schmorl's node at the L1 superior endplate is similar to comparison radiographs. Diffuse discogenic and facet degenerative changes noted throughout the lumbar spine at both instrumented in non instrumented levels. Additional degenerative changes in the hips and pelvis. The osseous structures appear diffusely demineralized which may limit detection of small or nondisplaced fractures. No acute or worrisome osseous lesions. IMPRESSION: 1. Large volume of inspissated fecal material noted throughout the colon.  Mild distal colonic thickening versus underdistention of the sigmoid without evidence of inflammation. Correlate for features of constipation. Furthermore, could consider correlation with most recent colonoscopy or outpatient visualization if not recently performed. 2. Similar thickening versus underdistention of the stomach, could reflect gastritis or secondary change given patient's prolonged emesis. 3. Moderately distended urinary bladder with layering air-fluid level. Could reflect recent instrumentation/catheterization, however if urinary symptoms are present, recommend further evaluation with urinalysis to exclude cystitis. 4. Cholelithiasis without evidence of acute cholecystitis. Mild focal thickening at the  gallbladder fundus is suspected to reflect adenomyomatosis but could correlate with outpatient right upper quadrant ultrasound. Could perform on acute basis if biliary symptoms are present. 5. Aortic Atherosclerosis (ICD10-I70.0). Electronically Signed   By: Lovena Le M.D.   On: 03/17/2020 19:37   X-ray chest PA and lateral  Result Date: 03/18/2020 CLINICAL DATA:  Acute renal insufficiency EXAM: CHEST - 2 VIEW COMPARISON:  December 26, 2014 FINDINGS: No pneumothorax. The cardiomediastinal silhouette is normal. No pulmonary nodules or masses. Mild bibasilar opacities, likely atelectasis. No overt edema. IMPRESSION: Probable mild bibasilar atelectasis. Recommend follow-up to resolution. No other acute abnormalities. Electronically Signed   By: Dorise Bullion III M.D   On: 03/18/2020 08:56   NM Pulmonary Perfusion  Result Date: 03/22/2020 CLINICAL DATA:  Elevated D-dimer level, acute renal failure. EXAM: NUCLEAR MEDICINE PERFUSION LUNG SCAN TECHNIQUE: Perfusion images were obtained in multiple projections after intravenous injection of radiopharmaceutical. Ventilation scans intentionally deferred if perfusion scan and chest x-ray adequate for interpretation during COVID 19 epidemic. RADIOPHARMACEUTICALS:  4.3 mCi Tc-74mMAA IV COMPARISON:  Chest radiograph 03/22/2020 FINDINGS: Small bandlike regions of hypoperfusion in the mid lungs bilaterally, not compelling for wedge-shaped, accordingly not considered of substantial suspicion for acute pulmonary embolus. IMPRESSION: No overtly wedge-shaped perfusion defects. By PISAPED criteria this is typically classified as "pulmonary embolism absent." Electronically Signed   By: WVan ClinesM.D.   On: 03/22/2020 18:33   IR Fluoro Guide CV Line Right  Result Date: 03/20/2020 INDICATION: 74year old female referred for hemodialysis catheter placement EXAM: IMAGE GUIDED PLACEMENT OF TUNNELED HEMODIALYSIS CATHETER MEDICATIONS: 2 g Ancef. The antibiotic was given in  an appropriate time interval prior to skin puncture. ANESTHESIA/SEDATION: Moderate (conscious) sedation was employed during this procedure. A total of Versed 0.5 mg and Fentanyl 12.5 mcg was administered intravenously. Moderate Sedation Time: 19 minutes. The patient's level of consciousness and vital signs were monitored continuously by radiology nursing throughout the procedure under my direct supervision. FLUOROSCOPY TIME:  Fluoroscopy Time: 0 minutes 36 seconds (1 mGy). COMPLICATIONS: None PROCEDURE: Informed written consent was obtained from the patient after a discussion of the risks, benefits, and alternatives to treatment. Questions regarding the procedure were encouraged and answered. The right neck and chest were prepped with chlorhexidine in a sterile fashion, and a sterile drape was applied covering the operative field. Maximum barrier sterile technique with sterile gowns and gloves were used for the procedure. A timeout was performed prior to the initiation of the procedure. Ultrasound survey was performed. Micropuncture kit was utilized to access the right internal jugular vein under direct, real-time ultrasound guidance after the overlying soft tissues were anesthetized with 1% lidocaine with epinephrine. Stab incision was made with 11 blade scalpel. Microwire was passed centrally. The microwire was then marked to measure appropriate internal catheter length. External tunneled length was estimated. A total tip to cuff length of 19 cm was selected. 035 guidewire was advanced to the level of the IVC. Skin and subcutaneous  tissues of chest wall below the clavicle were generously infiltrated with 1% lidocaine for local anesthesia. A small stab incision was made with 11 blade scalpel. The selected hemodialysis catheter was tunneled in a retrograde fashion from the anterior chest wall to the venotomy incision. Serial dilation was performed and then a peel-away sheath was placed. The catheter was then placed  through the peel-away sheath with tips ultimately positioned within the superior aspect of the right atrium. Final catheter positioning was confirmed and documented with a spot radiographic image. The catheter aspirates and flushes normally. The catheter was flushed with appropriate volume heparin dwells. The catheter exit site was secured with a 0-Prolene retention suture. Gel-Foam slurry was infused into the soft tissue tract. The venotomy incision was closed Derma bond and sterile dressing. Dressings were applied at the chest wall. Patient tolerated the procedure well and remained hemodynamically stable throughout. No complications were encountered and no significant blood loss encountered. IMPRESSION: Status post right IJ tunneled hemodialysis catheter placement. Catheter ready for use. Signed, Dulcy Fanny. Dellia Nims, RPVI [facility] Electronically Signed   By: Corrie Mckusick D.O.   On: 03/20/2020 15:27   IR US Guide Vasc Access Right  Result Date: 03/20/2020 INDICATION: 74 year old female referred for hemodialysis catheter placement EXAM: IMAGE GUIDED PLACEMENT OF TUNNELED HEMODIALYSIS CATHETER MEDICATIONS: 2 g Ancef. The antibiotic was given in an appropriate time interval prior to skin puncture. ANESTHESIA/SEDATION: Moderate (conscious) sedation was employed during this procedure. A total of Versed 0.5 mg and Fentanyl 12.5 mcg was administered intravenously. Moderate Sedation Time: 19 minutes. The patient's level of consciousness and vital signs were monitored continuously by radiology nursing throughout the procedure under my direct supervision. FLUOROSCOPY TIME:  Fluoroscopy Time: 0 minutes 36 seconds (1 mGy). COMPLICATIONS: None PROCEDURE: Informed written consent was obtained from the patient after a discussion of the risks, benefits, and alternatives to treatment. Questions regarding the procedure were encouraged and answered. The right neck and  chest were prepped with chlorhexidine in a sterile fashion, and a sterile drape was applied covering the operative field. Maximum barrier sterile technique with sterile gowns and gloves were used for the procedure. A timeout was performed prior to the initiation of the procedure. Ultrasound survey was performed. Micropuncture kit was utilized to access the right internal jugular vein under direct, real-time ultrasound guidance after the overlying soft tissues were anesthetized with 1% lidocaine with epinephrine. Stab incision was made with 11 blade scalpel. Microwire was passed centrally. The microwire was then marked to measure appropriate internal catheter length. External tunneled length was estimated. A total tip to cuff length of 19 cm was selected. 035 guidewire was advanced to the level of the IVC. Skin and subcutaneous tissues of chest wall below the clavicle were generously infiltrated with 1% lidocaine for local anesthesia. A small stab incision was made with 11 blade scalpel. The selected hemodialysis catheter was tunneled in a retrograde fashion from the anterior chest wall to the venotomy incision. Serial dilation was performed and then a peel-away sheath was placed. The catheter was then placed through the peel-away sheath with tips ultimately positioned within the superior aspect of the right atrium. Final catheter positioning was confirmed and documented with a spot radiographic image. The catheter aspirates and flushes normally. The catheter was flushed with appropriate volume heparin dwells. The catheter exit site was secured with a 0-Prolene retention suture. Gel-Foam slurry was infused into the soft tissue tract. The venotomy incision was closed Derma bond  and sterile dressing. Dressings were applied at the chest wall. Patient tolerated the procedure well and remained hemodynamically stable throughout. No complications were encountered and no significant blood loss encountered. IMPRESSION: Status  post right IJ tunneled hemodialysis catheter placement. Catheter ready for use. Signed, Dulcy Fanny. Dellia Nims, RPVI [facility] Electronically Signed   By: Corrie Mckusick D.O.   On: 03/20/2020 15:27   DG Chest Port 1 View  Result Date: 03/22/2020 CLINICAL DATA:  Elevated D-dimer level. EXAM: PORTABLE CHEST 1 VIEW COMPARISON:  Chest radiograph 03/19/2020 FINDINGS: New left perihilar bilateral infrahilar patchy airspace opacities. Blunting of the costophrenic angles bilaterally. Dialysis catheter tip: Lower SVC. No visible pneumothorax. Mild thoracic spondylosis. IMPRESSION: 1. New left perihilar and infrahilar patchy airspace opacities. This could represent bilateral pneumonia or asymmetric noncardiogenic edema. 2. Small bilateral pleural effusions. 3. New right IJ dialysis catheter tip: SVC.  No pneumothorax. Electronically Signed   By: Van Clines M.D.   On: 03/22/2020 18:34   DG Chest Port 1 View  Result Date: 03/19/2020 CLINICAL DATA:  Shortness of breath. EXAM: PORTABLE CHEST 1 VIEW COMPARISON:  03/18/2020 FINDINGS: The cardiac silhouette, mediastinal and hilar contours are within normal limits and stable. Stable underlying emphysematous changes and pulmonary scarring. Suspect streaky bibasilar infiltrates. No pleural effusions. No worrisome pulmonary lesions. IMPRESSION: 1. Underlying emphysematous changes and pulmonary scarring. 2. Suspect streaky bibasilar infiltrates. Electronically Signed   By: Marijo Sanes M.D.   On: 03/19/2020 07:59   US BIOPSY (KIDNEY)  Result Date: 03/22/2020 INDICATION: 31 year old with acute kidney injury.  Request for renal biopsy. EXAM: ULTRASOUND-GUIDED RANDOM LEFT RENAL BIOPSY MEDICATIONS: None. ANESTHESIA/SEDATION: Moderate (conscious) sedation was employed during this procedure. A total of Versed 1.0 mg and Fentanyl 50 mcg was administered intravenously. Moderate Sedation Time: 14 minutes. The patient's  level of consciousness and vital signs were monitored continuously by radiology nursing throughout the procedure under my direct supervision. FLUOROSCOPY TIME:  None COMPLICATIONS: None immediate. PROCEDURE: Informed written consent was obtained from the patient after a thorough discussion of the procedural risks, benefits and alternatives. All questions were addressed. A timeout was performed prior to the initiation of the procedure. Patient was placed prone. Both kidneys were evaluated with ultrasound. Left kidney was selected for biopsy. Left flank was prepped with chlorhexidine and sterile field was created. Skin and soft tissues were anesthetized with 1% lidocaine. Small skin incision was made. Using ultrasound guidance, 16 gauge core biopsy needle was directed into the left kidney lower pole. Core biopsy was obtained and placed in saline. A second core biopsy was obtained using ultrasound guidance from the left kidney lower pole. Second core biopsy was adequate and placed in saline. Bandage placed over the puncture site. FINDINGS: Perinephric edema involving both kidneys prior to the procedure. No significant bleeding or hematoma formation from the left kidney following the core biopsies. Two adequate core biopsies were obtained. IMPRESSION: Ultrasound-guided core biopsy of left kidney lower pole. Electronically Signed   By: Markus Daft M.D.   On: 03/22/2020 10:31   VAS [place] LOWER EXTREMITY VENOUS (DVT)  Result Date: 03/22/2020  Lower Venous DVTStudy Indications: Elevated Ddimer.  Risk Factors: None identified. Comparison Study: No prior studies. Performing Technologist: Oliver Hum RVT  Examination Guidelines: A complete evaluation includes B-mode imaging, spectral Doppler, color Doppler, and power Doppler as needed of all accessible portions of each vessel. Bilateral testing is considered an integral part of a complete examination. Limited examinations for reoccurring indications may  be performed as  noted. The reflux portion of the exam is performed with the patient in reverse Trendelenburg.  +---------+---------------+---------+-----------+----------+--------------+  RIGHT     Compressibility Phasicity Spontaneity Properties Thrombus Aging  +---------+---------------+---------+-----------+----------+--------------+  CFV       Full            Yes       Yes                                    +---------+---------------+---------+-----------+----------+--------------+  SFJ       Full                                                             +---------+---------------+---------+-----------+----------+--------------+  FV Prox   Full                                                             +---------+---------------+---------+-----------+----------+--------------+  FV Mid    Full                                                             +---------+---------------+---------+-----------+----------+--------------+  FV Distal Full                                                             +---------+---------------+---------+-----------+----------+--------------+  PFV       Full                                                             +---------+---------------+---------+-----------+----------+--------------+  POP       Full            Yes       Yes                                    +---------+---------------+---------+-----------+----------+--------------+  PTV       Full                                                             +---------+---------------+---------+-----------+----------+--------------+  PERO      Full                                                             +---------+---------------+---------+-----------+----------+--------------+   +---------+---------------+---------+-----------+----------+--------------+  LEFT      Compressibility Phasicity Spontaneity Properties Thrombus Aging  +---------+---------------+---------+-----------+----------+--------------+  CFV       Full             Yes       Yes                                    +---------+---------------+---------+-----------+----------+--------------+  SFJ       Full                                                             +---------+---------------+---------+-----------+----------+--------------+  FV Prox   Full                                                             +---------+---------------+---------+-----------+----------+--------------+  FV Mid    Full                                                             +---------+---------------+---------+-----------+----------+--------------+  FV Distal Full                                                             +---------+---------------+---------+-----------+----------+--------------+  PFV       Full                                                             +---------+---------------+---------+-----------+----------+--------------+  POP       Full            Yes       Yes                                    +---------+---------------+---------+-----------+----------+--------------+  PTV       Full                                                             +---------+---------------+---------+-----------+----------+--------------+  PERO      Full                                                             +---------+---------------+---------+-----------+----------+--------------+  Summary: RIGHT: - There is no evidence of deep vein thrombosis in the lower extremity.  - No cystic structure found in the popliteal fossa.  LEFT: - There is no evidence of deep vein thrombosis in the lower extremity.  - No cystic structure found in the popliteal fossa.  *See table(s) above for measurements and observations. Electronically signed by Servando Snare MD on 03/22/2020 at 4:37:23 PM.    Final    US Abdomen Limited RUQ  Result Date: 03/18/2020 CLINICAL DATA:  Nausea and vomiting EXAM: ULTRASOUND ABDOMEN LIMITED RIGHT UPPER QUADRANT COMPARISON:  CT abdomen and pelvis 03/17/2020  FINDINGS: Gallbladder: Large shadowing calculus within gallbladder 19 mm diameter. No gallbladder wall thickening, pericholecystic fluid or sonographic Murphy sign. Common bile duct: Diameter: 3 mm, normal Liver: Normal appearance. No mass or nodularity. Portal vein is patent on color Doppler imaging with normal direction of blood flow towards the liver. Other: Trace perihepatic free fluid. IMPRESSION: Cholelithiasis without evidence of cholecystitis. Trace ascites adjacent to liver. Electronically Signed   By: Lavonia Dana M.D.   On: 03/18/2020 13:32     Phillips Climes M.D on 03/24/2020 at 4:32 PM  To page go to [email or web address] - password [email or web address]

## 2020-03-25 LAB — TYPE AND SCREEN
ABO/RH(D): O POS
Antibody Screen: NEGATIVE
Unit division: 0

## 2020-03-25 LAB — THERAPEUTIC PLASMA EXCHANGE (BLOOD BANK)
Plasma volume needed: 3000
Unit division: 0
Unit division: 0
Unit division: 0
Unit division: 0
Unit division: 0
Unit division: 0
Unit division: 0
Unit division: 0
Unit division: 0

## 2020-03-25 LAB — RENAL FUNCTION PANEL
Albumin: 3 g/dL — ABNORMAL LOW (ref 3.5–5.0)
Anion gap: 11 (ref 5–15)
BUN: 29 mg/dL — ABNORMAL HIGH (ref 8–23)
CO2: 29 mmol/L (ref 22–32)
Calcium: 8.7 mg/dL — ABNORMAL LOW (ref 8.9–10.3)
Chloride: 95 mmol/L — ABNORMAL LOW (ref 98–111)
Creatinine, Ser: 2.91 mg/dL — ABNORMAL HIGH (ref 0.44–1.00)
GFR calc Af Amer: 18 mL/min — ABNORMAL LOW (ref >60)
GFR calc non Af Amer: 15 mL/min — ABNORMAL LOW (ref >60)
Glucose, Bld: 112 mg/dL — ABNORMAL HIGH (ref 70–99)
Phosphorus: 2.7 mg/dL (ref 2.5–4.6)
Potassium: 3.7 mmol/L (ref 3.5–5.1)
Sodium: 135 mmol/L (ref 135–145)

## 2020-03-25 LAB — CBC
HCT: 27.7 % — ABNORMAL LOW (ref 36.0–46.0)
Hemoglobin: 9.2 g/dL — ABNORMAL LOW (ref 12.0–15.0)
MCH: 27.9 pg (ref 26.0–34.0)
MCHC: 33.2 g/dL (ref 30.0–36.0)
MCV: 83.9 fL (ref 80.0–100.0)
Platelets: 165 10*3/uL (ref 150–400)
RBC: 3.3 MIL/uL — ABNORMAL LOW (ref 3.87–5.11)
RDW: 14.1 % (ref 11.5–15.5)
WBC: 15.9 10*3/uL — ABNORMAL HIGH (ref 4.0–10.5)
nRBC: 0 % (ref 0.0–0.2)

## 2020-03-25 LAB — BPAM RBC
Blood Product Expiration Date: 202108052359
ISSUE DATE / TIME: 202107031015
Unit Type and Rh: 5100

## 2020-03-25 MED ORDER — HYDRALAZINE HCL 50 MG PO TABS
100.0000 mg | ORAL_TABLET | Freq: Three times a day (TID) | ORAL | Status: DC
  Administered 2020-03-25 – 2020-04-05 (×25): 100 mg via ORAL
  Filled 2020-03-25 (×28): qty 2

## 2020-03-25 MED ORDER — DARBEPOETIN ALFA 40 MCG/0.4ML IJ SOSY
40.0000 ug | PREFILLED_SYRINGE | INTRAMUSCULAR | Status: DC
  Administered 2020-03-29: 40 ug via SUBCUTANEOUS
  Filled 2020-03-25 (×2): qty 0.4

## 2020-03-25 MED ORDER — SULFAMETHOXAZOLE-TRIMETHOPRIM 400-80 MG PO TABS: 1.0000 | ORAL_TABLET | Freq: Every day | ORAL | Status: DC

## 2020-03-25 MED ORDER — SULFAMETHOXAZOLE-TRIMETHOPRIM 400-80 MG PO TABS
1.0000 | ORAL_TABLET | ORAL | Status: DC
  Administered 2020-03-26 – 2020-04-09 (×5): 1 via ORAL
  Filled 2020-03-25 (×8): qty 1

## 2020-03-25 NOTE — Progress Notes (Signed)
PROGRESS NOTE                                                                                                                                                                                                             Patient Demographics:    Jasmine White, is a 74 y.o. female, DOB - [birth year], KGU:[id number]  Admit date - 03/17/2020   Admitting Physician Jasmine Boll, MD  Outpatient Primary MD for the patient is Jasmine White, [place]  LOS - 8  Chief Complaint  Patient presents with  . Emesis       Brief Narrative   -  Jasmine White is a 74 y.o. female with medical history significant of  Chronic back pain HTN, hypothyroidism, recently diagnosed CKD, HLD, Hx of COVID infection and recovery with persistent fatigue who presents with vomiting. Per patient states she has had intermittent n/v that started 9 days ago, in the ER she had changes suggestive of severe constipation and stool burden on CT scan, her blood numbers were suggestive of UTI, severe dehydration, hypernatremia and AKI.  She was admitted for further treatment.   Subjective:   Patient receiving dialysis, reports dyspnea is minimal today, denies any hemoptysis.   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 -Anti-GBM elevated at 137, MPO elevated at 10.3.  Note ANA also positive and anti-dsDNA elevated. -Treated with IV Solu-Medrol 1 g daily x3 daily, she is currently on prednisone . -Started on plasmapheresis, start date 6/29, plan for total of 7 days (trying to alternate with HD dialysis days ) -Hemodialysis  started on 6/30, and she was started hemodialysis on 6/29 after tunneled catheter inserted by IR. -Status post renal biopsy 7/1. 29 after tunneled catheter with IR.  First pheresis on 6/30.   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 -Management per renal -CT chest with nonspecific findings in bilateral areas with mosaic ground opacity and consolidative opacity, could be related to volume overload versus alveolar hemorrhage, so far she denies any hemoptysis, will continue to monitor closely. -She is currently on Cytoxan, continue with pheresis with [facility], given concern of developing alveolar hemorrhage, now increased daily pheresis x2 weeks tentatively. -On Bactrim prophylaxis Monday Wednesday Friday  UTI.   - Treated with Rocephin  Hyponatremia -Resolved  Elevated D-dimers -This is most likely in the setting of renal failure, and systematic inflammatory response, venous Dopplers negative, VQ scan is negative as well.  Essential hypertension.  - on metoprolol 100 mg twice daily, and Norvasc 10 mg, remains uncontrolled, so will increase hydralazine 200 mg oral 3 times daily   Chronic microcytic anemia.   -Iron, folate and B12 levels are acceptable, received Aranesp 7/1  -Bleeding 7 this morning, she received 1 unit PRBC transfusion 7/3.  Severe stool burden and constipation.   -Resolved  Weakness and deconditioning.  PT OT.  Hypothyroidism.  Continue home dose Synthroid, TSH is stable.    Condition - Extremely Guarded  Family Communication  : Discussed with Jasmine White via phone 7/3  Code Status :  Full  Consults  :  None  Procedures  :   CT - 1. Large volume of inspissated fecal material noted throughout the colon. Mild distal colonic thickening versus underdistention of the sigmoid without evidence of inflammation. Correlate for features of constipation. Furthermore, could consider correlation with most recent colonoscopy or outpatient visualization if not recently performed. 2. Similar thickening versus underdistention of the stomach, could reflect gastritis or secondary change given patient's prolonged emesis. 3. Moderately distended urinary  bladder with layering air-fluid level. Could reflect recent instrumentation/catheterization, however if urinary symptoms are present, recommend further evaluation with urinalysis to exclude cystitis. 4. Cholelithiasis without evidence of acute cholecystitis. Mild focal thickening at the gallbladder fundus is suspected to reflect adenomyomatosis but could correlate with outpatient right upper quadrant ultrasound. Could perform on acute basis if biliary symptoms are present. 5. Aortic Atherosclerosis (ICD10-I70.0).  PUD Prophylaxis : PPI  Disposition Plan  :    Status is: Inpatient  Remains inpatient appropriate because:IV treatments appropriate due to intensity of illness or inability to take PO   Dispo: The patient is from: Home              Anticipated d/c is to: SNF              Anticipated d/c date is: 3 days              Patient currently is not medically stable to d/c.   DVT Prophylaxis  :   [place] heparin  Lab Results  Component Value Date   PLT 165 03/25/2020    Diet :  Diet Order            Diet renal with fluid restriction Fluid restriction: 1200 mL Fluid; Room service appropriate? Yes; Fluid consistency: Thin  Diet effective now                  Inpatient Medications Scheduled Meds: . sodium chloride   Intravenous Once  . amLODipine  10 mg Oral Daily  . Chlorhexidine Gluconate Cloth  6 each Topical Q0600  . Chlorhexidine Gluconate Cloth  6 each Topical Q0600  . Chlorhexidine Gluconate Cloth  6 each Topical Q0600  . cholecalciferol  1,000 Units Oral Daily  . cyclophosphamide  50 mg Oral q1800  . [START ON 03/29/2020] darbepoetin (ARANESP) injection - NON-DIALYSIS  40 mcg Subcutaneous Q Thu-1800  . docusate sodium  200 mg Oral BID  . heparin injection (subcutaneous)  5,000 Units Subcutaneous Q8H  . hydrALAZINE  100 mg Oral TID  . levothyroxine  88 mcg Oral Q0600  . metoprolol tartrate  100 mg Oral BID  . pantoprazole  40 mg Oral Daily  . polyethylene glycol  17 g  Oral BID  .  predniSONE  60 mg Oral Q breakfast  . [START ON 03/26/2020] sulfamethoxazole-trimethoprim  1 tablet Oral Q M,W,F   Continuous Infusions:  PRN Meds:.acetaminophen, albuterol, [DISCONTINUED] ondansetron **OR** ondansetron (ZOFRAN) IV, technetium albumin aggregated  Antibiotics  :   Anti-infectives (From admission, onward)   Start     Dose/Rate Route Frequency Ordered Stop   03/26/20 1000  sulfamethoxazole-trimethoprim (BACTRIM) 400-80 MG per tablet 1 tablet     Discontinue     1 tablet Oral Every M-W-F 03/25/20 0954     03/25/20 2200  sulfamethoxazole-trimethoprim (BACTRIM) 400-80 MG per tablet 1 tablet  Status:  Discontinued        1 tablet Oral Daily at bedtime 03/25/20 0854 03/25/20 0921   03/20/20 1319  ceFAZolin (ANCEF) 2-4 GM/100ML-% IVPB       Note to Pharmacy: Arlean Hopping   : cabinet override      03/20/20 1319 03/21/20 0129   03/20/20 1100  ceFAZolin (ANCEF) IVPB 2g/100 mL premix        2 g 200 mL/hr over 30 Minutes Intravenous On call 03/20/20 1040 03/20/20 1506   03/19/20 0000  cefTRIAXone (ROCEPHIN) 1 g in sodium chloride 0.9 % 100 mL IVPB       Note to Pharmacy: 1st dose now   1 g 200 mL/hr over 30 Minutes Intravenous Every 24 hours 03/18/20 0127 03/21/20 0008   03/18/20 0145  cefTRIAXone (ROCEPHIN) 1 g in sodium chloride 0.9 % 100 mL IVPB       Note to Pharmacy: 1st dose now   1 g 200 mL/hr over 30 Minutes Intravenous  Once 03/18/20 0135 03/18/20 0252          Objective:   Vitals:   03/24/20 2305 03/25/20 0316 03/25/20 0735 03/25/20 0800  BP: (!) 169/80 (!) 171/82 (!) 143/66 (!) 151/66  Pulse: 81 90 92   Resp: '15 19 14 19  '$ Temp: 97.9 F (36.6 C) 98.6 F (37 C) 97.6 F (36.4 C)   TempSrc: Oral Oral Oral   SpO2: 94% 92% 94%   Weight:  61.3 kg    Height:        SpO2: 94 % O2 Flow Rate (L/min): 2 L/min  Wt Readings from Last 3 Encounters:  03/25/20 61.3 kg  03/01/20 61.7 kg  09/02/19 60.3 kg     Intake/Output Summary (Last 24 hours)  at 03/25/2020 1358 Last data filed at 03/25/2020 1200 Gross per 24 hour  Intake 408 ml  Output 10 ml  Net 398 ml     Physical Exam  Awake Alert, Oriented X 3, No new F.N deficits, Normal affect Symmetrical Chest wall movement, Good air movement bilaterally, scattered rales RRR,No Gallops,Rubs or new Murmurs, No Parasternal Heave +ve B.Sounds, Abd Soft, No tenderness, No rebound - guarding or rigidity. No Cyanosis, Clubbing or edema, No new Rash or bruise        Data Review:    Recent Labs  Lab 03/19/20 0256 03/19/20 0256 03/20/20 0419 03/20/20 0419 03/21/20 0509 03/21/20 0509 03/22/20 0103 03/22/20 0103 03/23/20 0815 03/23/20 1247 03/24/20 0226 03/24/20 1725 03/25/20 0159  WBC 11.8*   < > 11.5*   < > 10.7*   < > 14.0*  --  15.8*  --  16.1* 18.8* 15.9*  HGB 8.4*   < > 8.1*   < > 8.5*   < > 8.0*   < > 7.5* 7.1* 7.0* 10.5* 9.2*  HCT 23.6*   < > 23.2*   < > 24.7*   < >  22.9*   < > 22.9* 21.0* 21.4* 31.8* 27.7*  PLT 331   < > 251   < > 289   < > 281  --  217  --  184 198 165  MCV 75.9*   < > 76.8*   < > 76.7*   < > 78.2*  --  82.1  --  83.6 84.8 83.9  MCH 27.0   < > 26.8   < > 26.4   < > 27.3  --  26.9  --  27.3 28.0 27.9  MCHC 35.6   < > 34.9   < > 34.4   < > 34.9  --  32.8  --  32.7 33.0 33.2  RDW 13.2   < > 13.3   < > 13.6   < > 13.9  --  14.4  --  14.4 14.0 14.1  LYMPHSABS 0.8  --  0.5*  --  0.2*  --  0.3*  --   --   --   --   --   --   MONOABS 0.6  --  0.4  --  0.0*  --  0.3  --   --   --   --   --   --   EOSABS 0.1  --  0.0  --  0.0  --  0.0  --   --   --   --   --   --   BASOSABS 0.0  --  0.0  --  0.0  --  0.0  --   --   --   --   --   --    < > = values in this interval not displayed.    Recent Labs  Lab 03/19/20 0256 03/19/20 0806 03/20/20 0419 03/20/20 0706 03/21/20 0509 03/21/20 0509 03/21/20 1931 03/22/20 0103 03/22/20 0103 03/23/20 0228 03/23/20 0815 03/23/20 1247 03/24/20 0226 03/25/20 0159  NA 114*   < > 118*   < > 128*   < >  --  135   < >  135 135 135 137 135  K 4.2   < > 3.6   < > 3.8   < >  --  3.4*   < > 4.6 3.9 3.9 3.6 3.7  CL 77*   < > 83*   < > 93*   < >  --  95*   < > 97* 96* 95* 96* 95*  CO2 18*   < > 15*   < > 18*   < >  --  28  --  28 25  --  30 29  GLUCOSE 91   < > 90   < > 170*   < >  --  190*   < > 147* 158* 148* 113* 112*  BUN 121*   < > 121*   < > 74*   < >  --  41*   < > 30* 35* 37* 50* 29*  CREATININE 8.50*   < > 8.93*   < > 6.82*   < >  --  4.54*   < > 3.32* 3.85* 4.00* 4.68* 2.91*  CALCIUM 8.4*   < > 8.2*   < > 8.5*   < >  --  8.8*  --  8.7* 8.7*  --  8.9 8.7*  AST 18  --  20  --  22  --   --  23  --   --   --   --   --   --  ALT 11  --  13  --  13  --   --  14  --   --   --   --   --   --   ALKPHOS 60  --  62  --  70  --   --  52  --   --   --   --   --   --   BILITOT 0.5  --  0.4  --  1.0  --   --  0.7  --   --   --   --   --   --   ALBUMIN 2.6*  --  2.4*   < > 2.5*  --   --  3.0*  --  3.1*  --   --  3.0* 3.0*  MG 2.8*  --  2.7*  --  2.6*  --   --  2.2  --   --   --   --   --   --   CRP 11.4*  --  12.3*  --  19.2*  --   --  6.4*  --   --   --   --   --   --   DDIMER 3.30*  --  3.65*  --  7.21*  --   --  3.62*  --   --   --   --   --   --   PROCALCITON 0.22  --  0.29  --   --   --   --   --   --   --   --   --   --   --   INR  --   --   --   --   --   --   --  1.2  --   --   --   --   --   --   HGBA1C  --   --   --   --   --   --  5.9*  --   --   --   --   --   --   --   BNP 1,430.6*  --  2,535.5*  --  2,263.4*  --   --  3,320.9*  --   --   --   --   --   --    < > = values in this interval not displayed.    Recent Labs  Lab 03/19/20 0256 03/20/20 0419 03/21/20 0509 03/22/20 0103  CRP 11.4* 12.3* 19.2* 6.4*  DDIMER 3.30* 3.65* 7.21* 3.62*  BNP 1,430.6* 2,535.5* 2,263.4* 3,320.9*  PROCALCITON 0.22 0.29  --   --     ------------------------------------------------------------------------------------------------------------------ No results for input(s): CHOL, HDL, LDLCALC, TRIG, CHOLHDL, LDLDIRECT  in the last 72 hours.  Lab Results  Component Value Date   HGBA1C 5.9 (H) 03/21/2020   ------------------------------------------------------------------------------------------------------------------ No results for input(s): TSH, T4TOTAL, T3FREE, THYROIDAB in the last 72 hours.  Invalid input(s): FREET3 ------------------------------------------------------------------------------------------------------------------ No results for input(s): VITAMINB12, FOLATE, FERRITIN, TIBC, IRON, RETICCTPCT in the last 72 hours.  Coagulation profile Recent Labs  Lab 03/22/20 0103  INR 1.2    No results for input(s): DDIMER in the last 72 hours.  Cardiac Enzymes No results for input(s): CKMB, TROPONINI, MYOGLOBIN in the last 168 hours.  Invalid input(s): CK ------------------------------------------------------------------------------------------------------------------    Component Value Date/Time   BNP 3,320.9 (H) 03/22/2020 0103    Micro Results Recent Results (from the past 240 hour(s))  SARS Coronavirus 2 by RT PCR (hospital order, performed in [facility] hospital lab) Nasopharyngeal Nasopharyngeal Swab     Status: None   Collection Time: 03/17/20  7:18 PM   Specimen: Nasopharyngeal Swab  Result Value Ref Range Status   SARS Coronavirus 2 NEGATIVE NEGATIVE Final    Comment: (NOTE) SARS-CoV-2 target nucleic acids are NOT DETECTED.  The SARS-CoV-2 RNA is generally detectable in upper and lower respiratory specimens during the acute phase of infection. The lowest concentration of SARS-CoV-2 viral copies this assay can detect is 250 copies / mL. A negative result does not preclude SARS-CoV-2 infection and should not be used as the sole basis for treatment or other patient management decisions.  A negative result may occur with improper specimen collection / handling, submission of specimen other than nasopharyngeal swab, presence of viral mutation(s) within the areas targeted by  this assay, and inadequate number of viral copies (<250 copies / mL). A negative result must be combined with clinical observations, patient history, and epidemiological information.  Fact Sheet for Patients:   StrictlyIdeas.no  Fact Sheet for Healthcare Providers: BankingDealers.co.za  This test is not yet approved or  cleared by the [place] FDA and has been authorized for detection and/or diagnosis of SARS-CoV-2 by FDA under an Emergency Use Authorization (EUA).  This EUA will remain in effect (meaning this test can be used) for the duration of the COVID-19 declaration under Section 564(b)(1) of the Act, 21 U.S.C. section 360bbb-3(b)(1), unless the authorization is terminated or revoked sooner.  Performed at [facility], [address] [place], [place] [address]   MRSA PCR Screening     Status: None   Collection Time: 03/17/20 11:47 PM   Specimen: Nasal Mucosa; Nasopharyngeal  Result Value Ref Range Status   MRSA by PCR NEGATIVE NEGATIVE Final    Comment:        The GeneXpert MRSA Assay (FDA approved for NASAL specimens only), is one component of a comprehensive MRSA colonization surveillance program. It is not intended to diagnose MRSA infection nor to guide or monitor treatment for MRSA infections. Performed at [facility], [place] [address], [place], [place] [address]   Culture, Urine     Status: None   Collection Time: 03/19/20 10:43 AM   Specimen: Urine, Random  Result Value Ref Range Status   Specimen Description URINE, RANDOM  Final   Special Requests NONE  Final   Culture   Final    NO GROWTH Performed at [facility], [place] [address] [place], [place] [address]    Report Status 03/20/2020 FINAL  Final    Radiology Reports CT Abdomen Pelvis Wo Contrast  Result Date: 03/17/2020 CLINICAL DATA:  Vomiting for 1 week, low sella diam, elevated BUN and creatinine EXAM: CT ABDOMEN AND  PELVIS WITHOUT CONTRAST TECHNIQUE: Multidetector CT imaging of the abdomen and pelvis was performed following the standard protocol without IV contrast. COMPARISON:  Chest radiograph 12/26/2014, lumbar radiograph 08/13/2012, MR lumbar spine 06/16/2012 FINDINGS: Lower chest: Scarring and reticular changes noted in the lung bases, left greater than right. No consolidative opacity or pleural effusion. Normal heart size. No pericardial effusion. Tiny benign appearing macrocalcification versus fiducial in the right breast. Hepatobiliary: No visible focal liver lesions. Smooth liver surface contour. Normal hepatic attenuation. Gallbladder is moderately distended. Some focal thickening is noted towards the gallbladder fundus, possible adenomyomatosis. A partially calcified gallstone noted layering dependently as well. No pericholecystic fluid or inflammation. Normal caliber  biliary tree without visible calcified intraductal gallstones. Pancreas: Moderate pancreatic atrophy. No inflammation or ductal dilatation. No discernible pancreatic lesions. Spleen: Normal in size without focal abnormality. Adrenals/Urinary Tract: No concerning adrenal lesions. No visible or contour deforming renal lesions. No urolithiasis or hydronephrosis. Urinary bladder is moderately distended with layering air-fluid level. No significant pericholecystic inflammation or urinary bladder wall thickening. Stomach/Bowel: Question some mild thickening versus underdistention of the stomach. Duodenum takes a normal course across the midline abdomen. No small bowel thickening or dilatation. Large volume of inspissated fecal material noted throughout the colon. Question some mild distal colonic thickening versus underdistention of the sigmoid. No adjacent inflammation, free fluid or evidence of perforation. No high-grade bowel obstruction. Vascular/Lymphatic: Atherosclerotic calcifications throughout the abdominal aorta and branch vessels. No aneurysm or  ectasia. No enlarged abdominopelvic lymph nodes. Reproductive: Uterus is surgically absent. No concerning adnexal lesions. Other: No abdominopelvic free fluid or free gas. No bowel containing hernias. Small fat containing umbilical hernia. Musculoskeletal: Prior L3-L5 posterior spinal fusion. No evidence of hardware failure or acute complication is seen. Mild anterior wedging and Schmorl's node at the L1 superior endplate is similar to comparison radiographs. Diffuse discogenic and facet degenerative changes noted throughout the lumbar spine at both instrumented in non instrumented levels. Additional degenerative changes in the hips and pelvis. The osseous structures appear diffusely demineralized which may limit detection of small or nondisplaced fractures. No acute or worrisome osseous lesions. IMPRESSION: 1. Large volume of inspissated fecal material noted throughout the colon. Mild distal colonic thickening versus underdistention of the sigmoid without evidence of inflammation. Correlate for features of constipation. Furthermore, could consider correlation with most recent colonoscopy or outpatient visualization if not recently performed. 2. Similar thickening versus underdistention of the stomach, could reflect gastritis or secondary change given patient's prolonged emesis. 3. Moderately distended urinary bladder with layering air-fluid level. Could reflect recent instrumentation/catheterization, however if urinary symptoms are present, recommend further evaluation with urinalysis to exclude cystitis. 4. Cholelithiasis without evidence of acute cholecystitis. Mild focal thickening at the gallbladder fundus is suspected to reflect adenomyomatosis but could correlate with outpatient right upper quadrant ultrasound. Could perform on acute basis if biliary symptoms are present. 5. Aortic Atherosclerosis (ICD10-I70.0). Electronically Signed   By: Lovena Le M.D.   On: 03/17/2020 19:37   X-ray chest PA and  lateral  Result Date: 03/18/2020 CLINICAL DATA:  Acute renal insufficiency EXAM: CHEST - 2 VIEW COMPARISON:  December 26, 2014 FINDINGS: No pneumothorax. The cardiomediastinal silhouette is normal. No pulmonary nodules or masses. Mild bibasilar opacities, likely atelectasis. No overt edema. IMPRESSION: Probable mild bibasilar atelectasis. Recommend follow-up to resolution. No other acute abnormalities. Electronically Signed   By: Dorise Bullion III M.D   On: 03/18/2020 08:56   CT CHEST WO CONTRAST  Result Date: 03/24/2020 CLINICAL DATA:  Shortness of breath, positive anti GBM, assess for alveolar hemorrhage EXAM: CT CHEST WITHOUT CONTRAST TECHNIQUE: Multidetector CT imaging of the chest was performed following the standard protocol without IV contrast. COMPARISON:  Radiograph 03/19/2020 FINDINGS: Cardiovascular: Normal heart size. No pericardial effusion. Extensive coronary artery calcifications are present. Additional calcifications present on the aortic leaflets. Hypoattenuation the cardiac blood pool relative to the myocardium compatible with anemia. Atherosclerotic plaque within the normal caliber aorta. Normal 3 vessel branching of the aortic arch with extensive calcification of the proximal great vessels. Central pulmonary arteries are normal caliber. Luminal evaluation precluded in the absence of contrast media. A tunneled right IJ central venous catheter tip terminates at the level  of the superior cavoatrial junction. No other major venous abnormality. Mediastinum/Nodes: Numerous subcentimeter low-attenuation mediastinal nodes are present favored to be reactive. No concerning axillary adenopathy. Hilar nodal evaluation limited in the absence of intravenous contrast media. No acute abnormality of the trachea or esophagus. No concerning thyroid nodules. Drastic inlet is unremarkable. Lungs/Pleura: Central airways are unremarkable. There are mosaic areas of mixed ground-glass and early developing  consolidative opacity throughout both lungs most prominent centrally in the left upper lobe, superior segment left lower lobe and right upper lobe. Some mild septal thickening is noted towards the apices. Biapical pleuroparenchymal scarring is noted as well. There are moderate bilateral pleural effusions with adjacent areas of passive atelectasis. Underlying airspace disease in the regions of volume loss is difficult to exclude. Upper Abdomen: No acute abnormalities present in the visualized portions of the upper abdomen. Musculoskeletal: The osseous structures appear diffusely demineralized which may limit detection of small or nondisplaced fractures. Superior endplate deformity at L1 is likely related to a large Schmorl's node. No acute or worrisome osseous lesions. Multilevel degenerative changes are present in the imaged portions of the spine. Additional degenerative changes in the shoulders. Scattered fiducials and likely benign macro calcifications noted in the bilateral breasts. No concerning chest wall lesions. IMPRESSION: 1. Bilateral areas of mosaic ground-glass and consolidative opacity are nonspecific. In the setting of anti GBM antibodies, very early or developing alveolar hemorrhage could present with a similar appearance however the presence of septal thickening may suggest edema and certainly atypical infections including viral pneumonia would present similarly. 2. Moderate bilateral pleural effusions. Adjacent areas of passive atelectasis. Cannot exclude underlying airspace disease. 3.  Atherosclerotic plaque within the normal caliber aorta. 4. Coronary artery calcifications are present. Please note that the presence of coronary artery calcium documents the presence of coronary artery disease, the severity of this disease and any potential stenosis cannot be assessed on this non-gated CT examination. Electronically Signed   By: Lovena Le M.D.   On: 03/24/2020 22:59   NM Pulmonary  Perfusion  Result Date: 03/22/2020 CLINICAL DATA:  Elevated D-dimer level, acute renal failure. EXAM: NUCLEAR MEDICINE PERFUSION LUNG SCAN TECHNIQUE: Perfusion images were obtained in multiple projections after intravenous injection of radiopharmaceutical. Ventilation scans intentionally deferred if perfusion scan and chest x-ray adequate for interpretation during COVID 19 epidemic. RADIOPHARMACEUTICALS:  4.3 mCi Tc-62mMAA IV COMPARISON:  Chest radiograph 03/22/2020 FINDINGS: Small bandlike regions of hypoperfusion in the mid lungs bilaterally, not compelling for wedge-shaped, accordingly not considered of substantial suspicion for acute pulmonary embolus. IMPRESSION: No overtly wedge-shaped perfusion defects. By PISAPED criteria this is typically classified as "pulmonary embolism absent." Electronically Signed   By: WVan ClinesM.D.   On: 03/22/2020 18:33   IR Fluoro Guide CV Line Right  Result Date: 03/20/2020 INDICATION: 74year old female referred for hemodialysis catheter placement EXAM: IMAGE GUIDED PLACEMENT OF TUNNELED HEMODIALYSIS CATHETER MEDICATIONS: 2 g Ancef. The antibiotic was given in an appropriate time interval prior to skin puncture. ANESTHESIA/SEDATION: Moderate (conscious) sedation was employed during this procedure. A total of Versed 0.5 mg and Fentanyl 12.5 mcg was administered intravenously. Moderate Sedation Time: 19 minutes. The patient's level of consciousness and vital signs were monitored continuously by radiology nursing throughout the procedure under my direct supervision. FLUOROSCOPY TIME:  Fluoroscopy Time: 0 minutes 36 seconds (1 mGy). COMPLICATIONS: None PROCEDURE: Informed written consent was obtained from the patient after a discussion of the risks, benefits, and alternatives to treatment. Questions regarding the procedure were encouraged and answered.  The right neck and chest were prepped with chlorhexidine in a sterile fashion, and a sterile drape was applied  covering the operative field. Maximum barrier sterile technique with sterile gowns and gloves were used for the procedure. A timeout was performed prior to the initiation of the procedure. Ultrasound survey was performed. Micropuncture kit was utilized to access the right internal jugular vein under direct, real-time ultrasound guidance after the overlying soft tissues were anesthetized with 1% lidocaine with epinephrine. Stab incision was made with 11 blade scalpel. Microwire was passed centrally. The microwire was then marked to measure appropriate internal catheter length. External tunneled length was estimated. A total tip to cuff length of 19 cm was selected. 035 guidewire was advanced to the level of the IVC. Skin and subcutaneous tissues of chest wall below the clavicle were generously infiltrated with 1% lidocaine for local anesthesia. A small stab incision was made with 11 blade scalpel. The selected hemodialysis catheter was tunneled in a retrograde fashion from the anterior chest wall to the venotomy incision. Serial dilation was performed and then a peel-away sheath was placed. The catheter was then placed through the peel-away sheath with tips ultimately positioned within the superior aspect of the right atrium. Final catheter positioning was confirmed and documented with a spot radiographic image. The catheter aspirates and flushes normally. The catheter was flushed with appropriate volume heparin dwells. The catheter exit site was secured with a 0-Prolene retention suture. Gel-Foam slurry was infused into the soft tissue tract. The venotomy incision was closed Derma bond and sterile dressing. Dressings were applied at the chest wall. Patient tolerated the procedure well and remained hemodynamically stable throughout. No complications were encountered and no significant blood loss encountered. IMPRESSION: Status post right IJ tunneled hemodialysis catheter placement. Catheter ready for use. Signed,  Dulcy Fanny. Dellia Nims, RPVI [facility] Electronically Signed   By: Corrie Mckusick D.O.   On: 03/20/2020 15:27   IR US Guide Vasc Access Right  Result Date: 03/20/2020 INDICATION: 74 year old female referred for hemodialysis catheter placement EXAM: IMAGE GUIDED PLACEMENT OF TUNNELED HEMODIALYSIS CATHETER MEDICATIONS: 2 g Ancef. The antibiotic was given in an appropriate time interval prior to skin puncture. ANESTHESIA/SEDATION: Moderate (conscious) sedation was employed during this procedure. A total of Versed 0.5 mg and Fentanyl 12.5 mcg was administered intravenously. Moderate Sedation Time: 19 minutes. The patient's level of consciousness and vital signs were monitored continuously by radiology nursing throughout the procedure under my direct supervision. FLUOROSCOPY TIME:  Fluoroscopy Time: 0 minutes 36 seconds (1 mGy). COMPLICATIONS: None PROCEDURE: Informed written consent was obtained from the patient after a discussion of the risks, benefits, and alternatives to treatment. Questions regarding the procedure were encouraged and answered. The right neck and chest were prepped with chlorhexidine in a sterile fashion, and a sterile drape was applied covering the operative field. Maximum barrier sterile technique with sterile gowns and gloves were used for the procedure. A timeout was performed prior to the initiation of the procedure. Ultrasound survey was performed. Micropuncture kit was utilized to access the right internal jugular vein under direct, real-time ultrasound guidance after the overlying soft tissues were anesthetized with 1% lidocaine with epinephrine. Stab incision was made with 11 blade scalpel. Microwire was passed centrally. The microwire was then marked to measure appropriate internal catheter length. External tunneled length was estimated. A total tip to cuff length of 19 cm was selected. 035 guidewire was advanced to the level of  the IVC. Skin  and subcutaneous tissues of chest wall below the clavicle were generously infiltrated with 1% lidocaine for local anesthesia. A small stab incision was made with 11 blade scalpel. The selected hemodialysis catheter was tunneled in a retrograde fashion from the anterior chest wall to the venotomy incision. Serial dilation was performed and then a peel-away sheath was placed. The catheter was then placed through the peel-away sheath with tips ultimately positioned within the superior aspect of the right atrium. Final catheter positioning was confirmed and documented with a spot radiographic image. The catheter aspirates and flushes normally. The catheter was flushed with appropriate volume heparin dwells. The catheter exit site was secured with a 0-Prolene retention suture. Gel-Foam slurry was infused into the soft tissue tract. The venotomy incision was closed Derma bond and sterile dressing. Dressings were applied at the chest wall. Patient tolerated the procedure well and remained hemodynamically stable throughout. No complications were encountered and no significant blood loss encountered. IMPRESSION: Status post right IJ tunneled hemodialysis catheter placement. Catheter ready for use. Signed, Dulcy Fanny. Dellia Nims, RPVI [facility] Electronically Signed   By: Corrie Mckusick D.O.   On: 03/20/2020 15:27   DG Chest Port 1 View  Result Date: 03/22/2020 CLINICAL DATA:  Elevated D-dimer level. EXAM: PORTABLE CHEST 1 VIEW COMPARISON:  Chest radiograph 03/19/2020 FINDINGS: New left perihilar bilateral infrahilar patchy airspace opacities. Blunting of the costophrenic angles bilaterally. Dialysis catheter tip: Lower SVC. No visible pneumothorax. Mild thoracic spondylosis. IMPRESSION: 1. New left perihilar and infrahilar patchy airspace opacities. This could represent bilateral pneumonia or asymmetric noncardiogenic edema. 2. Small bilateral pleural  effusions. 3. New right IJ dialysis catheter tip: SVC.  No pneumothorax. Electronically Signed   By: Van Clines M.D.   On: 03/22/2020 18:34   DG Chest Port 1 View  Result Date: 03/19/2020 CLINICAL DATA:  Shortness of breath. EXAM: PORTABLE CHEST 1 VIEW COMPARISON:  03/18/2020 FINDINGS: The cardiac silhouette, mediastinal and hilar contours are within normal limits and stable. Stable underlying emphysematous changes and pulmonary scarring. Suspect streaky bibasilar infiltrates. No pleural effusions. No worrisome pulmonary lesions. IMPRESSION: 1. Underlying emphysematous changes and pulmonary scarring. 2. Suspect streaky bibasilar infiltrates. Electronically Signed   By: Marijo Sanes M.D.   On: 03/19/2020 07:59   US BIOPSY (KIDNEY)  Result Date: 03/22/2020 INDICATION: 18 year old with acute kidney injury.  Request for renal biopsy. EXAM: ULTRASOUND-GUIDED RANDOM LEFT RENAL BIOPSY MEDICATIONS: None. ANESTHESIA/SEDATION: Moderate (conscious) sedation was employed during this procedure. A total of Versed 1.0 mg and Fentanyl 50 mcg was administered intravenously. Moderate Sedation Time: 14 minutes. The patient's level of consciousness and vital signs were monitored continuously by radiology nursing throughout the procedure under my direct supervision. FLUOROSCOPY TIME:  None COMPLICATIONS: None immediate. PROCEDURE: Informed written consent was obtained from the patient after a thorough discussion of the procedural risks, benefits and alternatives. All questions were addressed. A timeout was performed prior to the initiation of the procedure. Patient was placed prone. Both kidneys were evaluated with ultrasound. Left kidney was selected for biopsy. Left flank was prepped with chlorhexidine and sterile field was created. Skin and soft tissues were anesthetized with 1% lidocaine. Small skin incision was made. Using ultrasound guidance, 16 gauge core biopsy needle was directed into the left kidney lower pole.  Core biopsy was obtained and placed in saline. A second core biopsy was obtained using ultrasound guidance from the left kidney lower pole. Second core biopsy was adequate and placed in saline. Bandage placed  over the puncture site. FINDINGS: Perinephric edema involving both kidneys prior to the procedure. No significant bleeding or hematoma formation from the left kidney following the core biopsies. Two adequate core biopsies were obtained. IMPRESSION: Ultrasound-guided core biopsy of left kidney lower pole. Electronically Signed   By: Markus Daft M.D.   On: 03/22/2020 10:31   VAS [place] LOWER EXTREMITY VENOUS (DVT)  Result Date: 03/22/2020  Lower Venous DVTStudy Indications: Elevated Ddimer.  Risk Factors: None identified. Comparison Study: No prior studies. Performing Technologist: Oliver Hum RVT  Examination Guidelines: A complete evaluation includes B-mode imaging, spectral Doppler, color Doppler, and power Doppler as needed of all accessible portions of each vessel. Bilateral testing is considered an integral part of a complete examination. Limited examinations for reoccurring indications may be performed as noted. The reflux portion of the exam is performed with the patient in reverse Trendelenburg.  +---------+---------------+---------+-----------+----------+--------------+ RIGHT    CompressibilityPhasicitySpontaneityPropertiesThrombus Aging +---------+---------------+---------+-----------+----------+--------------+ CFV      Full           Yes      Yes                                 +---------+---------------+---------+-----------+----------+--------------+ SFJ      Full                                                        +---------+---------------+---------+-----------+----------+--------------+ FV Prox  Full                                                        +---------+---------------+---------+-----------+----------+--------------+ FV Mid   Full                                                         +---------+---------------+---------+-----------+----------+--------------+ FV DistalFull                                                        +---------+---------------+---------+-----------+----------+--------------+ PFV      Full                                                        +---------+---------------+---------+-----------+----------+--------------+ POP      Full           Yes      Yes                                 +---------+---------------+---------+-----------+----------+--------------+ PTV      Full                                                        +---------+---------------+---------+-----------+----------+--------------+  PERO     Full                                                        +---------+---------------+---------+-----------+----------+--------------+   +---------+---------------+---------+-----------+----------+--------------+ LEFT     CompressibilityPhasicitySpontaneityPropertiesThrombus Aging +---------+---------------+---------+-----------+----------+--------------+ CFV      Full           Yes      Yes                                 +---------+---------------+---------+-----------+----------+--------------+ SFJ      Full                                                        +---------+---------------+---------+-----------+----------+--------------+ FV Prox  Full                                                        +---------+---------------+---------+-----------+----------+--------------+ FV Mid   Full                                                        +---------+---------------+---------+-----------+----------+--------------+ FV DistalFull                                                        +---------+---------------+---------+-----------+----------+--------------+ PFV      Full                                                         +---------+---------------+---------+-----------+----------+--------------+ POP      Full           Yes      Yes                                 +---------+---------------+---------+-----------+----------+--------------+ PTV      Full                                                        +---------+---------------+---------+-----------+----------+--------------+ PERO     Full                                                        +---------+---------------+---------+-----------+----------+--------------+  Summary: RIGHT: - There is no evidence of deep vein thrombosis in the lower extremity.  - No cystic structure found in the popliteal fossa.  LEFT: - There is no evidence of deep vein thrombosis in the lower extremity.  - No cystic structure found in the popliteal fossa.  *See table(s) above for measurements and observations. Electronically signed by Servando Snare MD on 03/22/2020 at 4:37:23 PM.    Final    US Abdomen Limited RUQ  Result Date: 03/18/2020 CLINICAL DATA:  Nausea and vomiting EXAM: ULTRASOUND ABDOMEN LIMITED RIGHT UPPER QUADRANT COMPARISON:  CT abdomen and pelvis 03/17/2020 FINDINGS: Gallbladder: Large shadowing calculus within gallbladder 19 mm diameter. No gallbladder wall thickening, pericholecystic fluid or sonographic Murphy sign. Common bile duct: Diameter: 3 mm, normal Liver: Normal appearance. No mass or nodularity. Portal vein is patent on color Doppler imaging with normal direction of blood flow towards the liver. Other: Trace perihepatic free fluid. IMPRESSION: Cholelithiasis without evidence of cholecystitis. Trace ascites adjacent to liver. Electronically Signed   By: Lavonia Dana M.D.   On: 03/18/2020 13:32     Phillips Climes M.D on 03/25/2020 at 1:58 PM  To page go to [email or web address] - password [email or web address] [address]

## 2020-03-25 NOTE — Progress Notes (Signed)
[facility] Progress Note  Name: Jasmine White MRN: [id number] DOB: 17-Jun-1946  Chief Complaint:  n/v  Subjective:   Feels great.  Urine output was never charted for 7/2 or 7/3.  foley was removed on 7/2.  Had 10 mL uop over 7/4 charted thus far.  Bladder scan 43.  She states she is urinating some but doesn't think it was collected.  However, she was so excited that she made urine and wet the bed this morning that she called her daughter to tell her.  Had HD on 7/3 with 2.6 kg removed.  Also had pheresis yesterday, 7/3.  Got a unit of PRBC's on 7/3.   CT chest with nonspecific but with bilateral areas of mosaic ground glass and consolidative opacity could be early or developing alveolar hemorrhage.     Review of systems:    Denies n/v   Reports shortness of breath is better and she's off oxygen denies chest pain  -------------  Background on consult:  HPI: Pt is a 50F with a PMH sig for HTN, HLD, hypothyroidism, COVID in November who is now seen in consultation at the request of Dr. Candiss Norse for eval and recs re: AKI, hyponatremia.  Pt has had COVID in November and since then hasn't felt right.  Having burning every night in her legs for which she was taking Advil nightly.  Takes hyzaar for HTN.  Previously Cr was 0.78 in Dec 2020.  Went to PCP 03/01/20 and was 2.34 then.  Has had one week of n/v.  Has still been taking her Advil and her hyzaar.  Went to [facility] last night where she was found to have Cr of > 7, BUN 119, and severe hyponatremia at 109. CT scan showed large amount of inspissated stool and possibly thickened sigmoid colon and stomach/ GB.  Was given 1L NS, started on 50 mL/ hr NS, and transferred to [facility] for admission.  Pt states she's very thirsty right now.  No dizziness/ falls/ seizure like activity.  She has been hypertensive here.  Urine lytes show isosthenuria with urine Na 24.  (? If before or after IV NS bolus).  Na 112 this AM, no real movement in BUN/ Cr.   No HA, f/c, SOB, rashes, nosebleeds, red/ itchy eyes, nasal crusting, coughing up blood, blood in urine/ stool.  Says some foamy urine.    Intake/Output Summary (Last 24 hours) at 03/25/2020 0918 Last data filed at 03/25/2020 0800 Gross per 24 hour  Intake 530 ml  Output 2560 ml  Net -2030 ml    Vitals:  Vitals:   03/24/20 2305 03/25/20 0316 03/25/20 0735 03/25/20 0800  BP: (!) 169/80 (!) 171/82 (!) 143/66 (!) 151/66  Pulse: 81 90 92   Resp: 15 19 14 19   Temp: 97.9 F (36.6 C) 98.6 F (37 C) 97.6 F (36.4 C)   TempSrc: Oral Oral Oral   SpO2: 94% 92% 94%   Weight:  61.3 kg    Height:         Physical Exam:  General adult female in bed in no acute distress HEENT normocephalic atraumatic extraocular movements intact sclera anicteric Neck supple trachea midline Lungs crackles on auscultation bilaterally normal work of breathing at rest Heart S1S2 no rub Abdomen soft nontender nondistended Extremities no pitting edema appreciated Psych normal mood and affect Neuro alert and oriented x 3 provides hx and follows commands  GU - no foley Access: RIJ tunneled HD catheter in place  Medications reviewed   Labs:  BMP Latest Ref Rng & Units 03/25/2020 03/24/2020 03/23/2020  Glucose 70 - 99 mg/dL 112(H) 113(H) 148(H)  BUN 8 - 23 mg/dL 29(H) 50(H) 37(H)  Creatinine 0.44 - 1.00 mg/dL 2.91(H) 4.68(H) 4.00(H)  BUN/Creat Ratio 12 - 28 - - -  Sodium 135 - 145 mmol/L 135 137 135  Potassium 3.5 - 5.1 mmol/L 3.7 3.6 3.9  Chloride 98 - 111 mmol/L 95(L) 96(L) 95(L)  CO2 22 - 32 mmol/L 29 30 -  Calcium 8.9 - 10.3 mg/dL 8.7(L) 8.9 -     Assessment/Plan:   # AKI 2/2 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  Had Cr of 2.3 two weeks prior to presentation then rose to > 7 - 8 in the setting of 1 week of n/v, Advil, and Hyzaar. Work-up has included: Anti-GBM elevated at 137, MPO elevated at 10.3.  Note ANA also positive and anti-dsDNA elevated.   up/c ratio 7430  mg/g.  Hep C nonreactive, hep B negative, HIV nonreactive.  CT with mod distended urinary bladder, no hydro.  PR3 neg. Complement normal. SPEP no M spike. free light chains ratio normal. ASO negative.  HD started on 6/29 after tunneled catheter with IR.  First pheresis on 6/30.   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 S/p Solumedrol 1 gram daily x 3 days (started on 6/29)  -------- - Assess dialysis needs daily.  Tentative TTS schedule as needed for HD.  Spoke with HD SW on 7/2 and CLIP process initiated - note they identified a [facility] unit as closest to the patient  - Continue pred 60 mg daily  - continue cxytoxan 50 mg daily (for dosing at 0.8 mg/kg/day as she's on HD or crcl < 15).  Will dose after pheresis on days she gets pheresis. - Continue Pheresis with FFP (concern for developing alveolar hemorrhage) - now increased to daily pheresis x 2 weeks tentatively.  Last tx is currently set for 7/13 but reassess  - starting bactrim ppx MWF; on ppi and on vit d daily   # Severe hypovolemic hyponatremia: in setting of n/v and HCTZ use as well as profound AKI.  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  S/p Cautious correction to avoid adverse outcome.  - resolved - hypothyroidism per primary team    # Proteinuria 2/2 anti-GBM - up/c ratio 7430 mg/g with concurrent hematuria - work-up as above    # Metabolic acidosis - 2/2 AKI  - managed with HD   # Constipation - improving; please avoid all Mg containing laxatives/ enemas and fleets enemas.  Per primary   # HTN: holding her home hyzaar with AKI.  Improved control with medication/metoprolol titration back to home dose.  Increase hydralazine to 100 mg TID   # Metabolic bone disease - renal diet and may be able to liberalize soon.  Phos normalized after HD.  Now off binder.  Intact PTH pending.  On vit D 1000 units daily for ppx.   # Possible UTI- though rare  bact on the next day's UA and no growth on cx (appears obtained on ceftriaxone). S/p rocephin   # Abnormal CT scan- RUQ [place] with cholelithiasis without evidence of cholecystitis   # Microcytic anemia   - iron, B12, and folate acceptable  - s/p aranesp 40 mcg on 7/1 - set to redose on 7/8  - PRBC's x 1 unit on  7/3    # pre-diabetes - noted on work-up above.  Steroids for GN - given 100% crescents assess titration per her response  # Dispo: continue inpatient monitoring   Claudia Desanctis, MD 03/25/2020  9:54 AM

## 2020-03-25 NOTE — Progress Notes (Addendum)
2015 RN into room to assess pt. Pt noted to have new tremors to RUE and LLE. Pt stated she doesn't know if she's cold or if they're from something else. RN applied blanket to patient and increased thermostat, WCTM.   2206 Pt called for bedpan, unable to urinate. Pt states she has the urge to go but can't. RN bladder scanned pt, 19ml. RN informed pt to call if she has pain in lower abd, WCTM. Tremors noted again to RUE.   0330 Pt still due to void. Pt states she really doesn't have to go, RN performed bladder scan, 125ml. WCTM.

## 2020-03-26 LAB — HEPATITIS B SURFACE ANTIBODY,QUALITATIVE: Hep B S Ab: REACTIVE — AB

## 2020-03-26 LAB — BASIC METABOLIC PANEL
Anion gap: 10 (ref 5–15)
BUN: 14 mg/dL (ref 8–23)
CO2: 28 mmol/L (ref 22–32)
Calcium: 8 mg/dL — ABNORMAL LOW (ref 8.9–10.3)
Chloride: 98 mmol/L (ref 98–111)
Creatinine, Ser: 2.1 mg/dL — ABNORMAL HIGH (ref 0.44–1.00)
GFR calc Af Amer: 26 mL/min — ABNORMAL LOW (ref >60)
GFR calc non Af Amer: 23 mL/min — ABNORMAL LOW (ref >60)
Glucose, Bld: 109 mg/dL — ABNORMAL HIGH (ref 70–99)
Potassium: 4 mmol/L (ref 3.5–5.1)
Sodium: 136 mmol/L (ref 135–145)

## 2020-03-26 LAB — RENAL FUNCTION PANEL
Albumin: 2.8 g/dL — ABNORMAL LOW (ref 3.5–5.0)
Anion gap: 15 (ref 5–15)
BUN: 54 mg/dL — ABNORMAL HIGH (ref 8–23)
CO2: 26 mmol/L (ref 22–32)
Calcium: 8.5 mg/dL — ABNORMAL LOW (ref 8.9–10.3)
Chloride: 95 mmol/L — ABNORMAL LOW (ref 98–111)
Creatinine, Ser: 5.01 mg/dL — ABNORMAL HIGH (ref 0.44–1.00)
GFR calc Af Amer: 9 mL/min — ABNORMAL LOW (ref >60)
GFR calc non Af Amer: 8 mL/min — ABNORMAL LOW (ref >60)
Glucose, Bld: 95 mg/dL (ref 70–99)
Phosphorus: 3.1 mg/dL (ref 2.5–4.6)
Potassium: 3.4 mmol/L — ABNORMAL LOW (ref 3.5–5.1)
Sodium: 136 mmol/L (ref 135–145)

## 2020-03-26 LAB — CBC
HCT: 27.7 % — ABNORMAL LOW (ref 36.0–46.0)
Hemoglobin: 8.9 g/dL — ABNORMAL LOW (ref 12.0–15.0)
MCH: 27.6 pg (ref 26.0–34.0)
MCHC: 32.1 g/dL (ref 30.0–36.0)
MCV: 86 fL (ref 80.0–100.0)
Platelets: 161 10*3/uL (ref 150–400)
RBC: 3.22 MIL/uL — ABNORMAL LOW (ref 3.87–5.11)
RDW: 14.4 % (ref 11.5–15.5)
WBC: 18.8 10*3/uL — ABNORMAL HIGH (ref 4.0–10.5)
nRBC: 0 % (ref 0.0–0.2)

## 2020-03-26 LAB — HEPATITIS B CORE ANTIBODY, TOTAL: Hep B Core Total Ab: NONREACTIVE

## 2020-03-26 LAB — PARATHYROID HORMONE, INTACT (NO CA): PTH: 51 pg/mL (ref 15–65)

## 2020-03-26 MED ORDER — ACD FORMULA A 0.73-2.45-2.2 GM/100ML VI SOLN
Status: AC
  Filled 2020-03-26: qty 500

## 2020-03-26 MED ORDER — CALCIUM CARBONATE ANTACID 500 MG PO CHEW
CHEWABLE_TABLET | ORAL | Status: AC
  Filled 2020-03-26: qty 2

## 2020-03-26 MED ORDER — ACD FORMULA A 0.73-2.45-2.2 GM/100ML VI SOLN
1000.0000 mL | Status: DC
  Administered 2020-03-26: 1000 mL

## 2020-03-26 MED ORDER — ANTICOAGULANT SODIUM CITRATE 4% (200MG/5ML) IV SOLN
5.0000 mL | Freq: Once | Status: AC
  Administered 2020-03-26: 5 mL
  Filled 2020-03-26: qty 5

## 2020-03-26 MED ORDER — DIPHENHYDRAMINE HCL 25 MG PO CAPS
25.0000 mg | ORAL_CAPSULE | Freq: Four times a day (QID) | ORAL | Status: DC | PRN
  Administered 2020-03-26: 25 mg via ORAL

## 2020-03-26 MED ORDER — SODIUM CHLORIDE 0.9 % IV SOLN
3.0000 g | Freq: Once | INTRAVENOUS | Status: AC
  Administered 2020-03-26: 3 g via INTRAVENOUS
  Filled 2020-03-26: qty 30

## 2020-03-26 MED ORDER — DIPHENHYDRAMINE HCL 25 MG PO CAPS
ORAL_CAPSULE | ORAL | Status: AC
  Filled 2020-03-26: qty 1

## 2020-03-26 MED ORDER — ACETAMINOPHEN 325 MG PO TABS: 650.0000 mg | ORAL_TABLET | ORAL | Status: DC | PRN

## 2020-03-26 MED ORDER — HEPARIN SODIUM (PORCINE) 1000 UNIT/ML DIALYSIS: 20.0000 [IU]/kg | INTRAMUSCULAR | Status: DC | PRN

## 2020-03-26 MED ORDER — CALCIUM CARBONATE ANTACID 500 MG PO CHEW
2.0000 | CHEWABLE_TABLET | ORAL | Status: AC
  Administered 2020-03-26: 400 mg via ORAL

## 2020-03-26 NOTE — Progress Notes (Signed)
Patient ID: Jasmine White, female   DOB: 22-Jan-1946, 74 y.o.   MRN: [id number] S: Feels well but noticed "shaking" for the past 24 hours O:BP (!) 156/69 (BP Location: Left Arm)   Pulse 83   Temp 98.6 F (37 C) (Oral)   Resp 16   Ht 5\' 6"  (1.676 m)   Wt 63.3 kg   SpO2 96%   BMI 22.52 kg/m   Intake/Output Summary (Last 24 hours) at 03/26/2020 0914 Last data filed at 03/26/2020 5465 Gross per 24 hour  Intake 906 ml  Output --  Net 906 ml   Intake/Output: I/O last 3 completed shifts: In: 60 [P.O.:956] Out: 10 [Urine:10]  Intake/Output this shift:  No intake/output data recorded. Weight change: -0.1 kg Gen: WD WN WF in NAD CVS: no rub Resp: bibasilar crackles Abd: +BS, soft, NT/ND Ext: no edema Neuro- + tremors no asterixis  Recent Labs  Lab 03/20/20 0419 03/20/20 0706 03/21/20 0509 03/21/20 0509 03/22/20 0103 03/23/20 0228 03/23/20 0815 03/23/20 1247 03/24/20 0226 03/25/20 0159 03/26/20 0605  NA 118*   < > 128*   < > 135 135 135 135 137 135 136  K 3.6   < > 3.8   < > 3.4* 4.6 3.9 3.9 3.6 3.7 3.4*  CL 83*   < > 93*   < > 95* 97* 96* 95* 96* 95* 95*  CO2 15*   < > 18*  --  28 28 25   --  30 29 26   GLUCOSE 90   < > 170*   < > 190* 147* 158* 148* 113* 112* 95  BUN 121*   < > 74*   < > 41* 30* 35* 37* 50* 29* 54*  CREATININE 8.93*   < > 6.82*   < > 4.54* 3.32* 3.85* 4.00* 4.68* 2.91* 5.01*  ALBUMIN 2.4*  --  2.5*  --  3.0* 3.1*  --   --  3.0* 3.0* 2.8*  CALCIUM 8.2*   < > 8.5*  --  8.8* 8.7* 8.7*  --  8.9 8.7* 8.5*  PHOS  --   --  7.5*  --  5.3* 3.7  --   --  3.8 2.7 3.1  AST 20  --  22  --  23  --   --   --   --   --   --   ALT 13  --  13  --  14  --   --   --   --   --   --    < > = values in this interval not displayed.   Liver Function Tests: Recent Labs  Lab 03/20/20 0419 03/20/20 0419 03/21/20 0509 03/21/20 0509 03/22/20 0103 03/23/20 0228 03/24/20 0226 03/25/20 0159 03/26/20 0605  AST 20  --  22  --  23  --   --   --   --   ALT 13  --  13  --  14   --   --   --   --   ALKPHOS 62  --  70  --  52  --   --   --   --   BILITOT 0.4  --  1.0  --  0.7  --   --   --   --   PROT 6.4*  --  6.7  --  6.3*  --   --   --   --   ALBUMIN 2.4*   < > 2.5*   < >  3.0*   < > 3.0* 3.0* 2.8*   < > = values in this interval not displayed.   No results for input(s): LIPASE, AMYLASE in the last 168 hours. No results for input(s): AMMONIA in the last 168 hours. CBC: Recent Labs  Lab 03/20/20 0419 03/20/20 0419 03/21/20 0509 03/21/20 0509 03/22/20 0103 03/22/20 0103 03/23/20 0815 03/23/20 1247 03/24/20 0226 03/24/20 0226 03/24/20 1725 03/25/20 0159 03/26/20 0605  WBC 11.5*   < > 10.7*   < > 14.0*   < > 15.8*  --  16.1*   < > 18.8* 15.9* 18.8*  NEUTROABS 10.5*  --  10.3*  --  13.2*  --   --   --   --   --   --   --   --   HGB 8.1*   < > 8.5*   < > 8.0*   < > 7.5*   < > 7.0*   < > 10.5* 9.2* 8.9*  HCT 23.2*   < > 24.7*   < > 22.9*   < > 22.9*   < > 21.4*   < > 31.8* 27.7* 27.7*  MCV 76.8*   < > 76.7*   < > 78.2*   < > 82.1  --  83.6  --  84.8 83.9 86.0  PLT 251   < > 289   < > 281   < > 217  --  184   < > 198 165 161   < > = values in this interval not displayed.   Cardiac Enzymes: No results for input(s): CKTOTAL, CKMB, CKMBINDEX, TROPONINI in the last 168 hours. CBG: Recent Labs  Lab 03/20/20 2355  GLUCAP 109*    Iron Studies: No results for input(s): IRON, TIBC, TRANSFERRIN, FERRITIN in the last 72 hours. Studies/Results: CT CHEST WO CONTRAST  Result Date: 03/24/2020 CLINICAL DATA:  Shortness of breath, positive anti GBM, assess for alveolar hemorrhage EXAM: CT CHEST WITHOUT CONTRAST TECHNIQUE: Multidetector CT imaging of the chest was performed following the standard protocol without IV contrast. COMPARISON:  Radiograph 03/19/2020 FINDINGS: Cardiovascular: Normal heart size. No pericardial effusion. Extensive coronary artery calcifications are present. Additional calcifications present on the aortic leaflets. Hypoattenuation the cardiac  blood pool relative to the myocardium compatible with anemia. Atherosclerotic plaque within the normal caliber aorta. Normal 3 vessel branching of the aortic arch with extensive calcification of the proximal great vessels. Central pulmonary arteries are normal caliber. Luminal evaluation precluded in the absence of contrast media. A tunneled right IJ central venous catheter tip terminates at the level of the superior cavoatrial junction. No other major venous abnormality. Mediastinum/Nodes: Numerous subcentimeter low-attenuation mediastinal nodes are present favored to be reactive. No concerning axillary adenopathy. Hilar nodal evaluation limited in the absence of intravenous contrast media. No acute abnormality of the trachea or esophagus. No concerning thyroid nodules. Drastic inlet is unremarkable. Lungs/Pleura: Central airways are unremarkable. There are mosaic areas of mixed ground-glass and early developing consolidative opacity throughout both lungs most prominent centrally in the left upper lobe, superior segment left lower lobe and right upper lobe. Some mild septal thickening is noted towards the apices. Biapical pleuroparenchymal scarring is noted as well. There are moderate bilateral pleural effusions with adjacent areas of passive atelectasis. Underlying airspace disease in the regions of volume loss is difficult to exclude. Upper Abdomen: No acute abnormalities present in the visualized portions of the upper abdomen. Musculoskeletal: The osseous structures appear diffusely demineralized which may limit detection of small or  nondisplaced fractures. Superior endplate deformity at L1 is likely related to a large Schmorl's node. No acute or worrisome osseous lesions. Multilevel degenerative changes are present in the imaged portions of the spine. Additional degenerative changes in the shoulders. Scattered fiducials and likely benign macro calcifications noted in the bilateral breasts. No concerning chest  wall lesions. IMPRESSION: 1. Bilateral areas of mosaic ground-glass and consolidative opacity are nonspecific. In the setting of anti GBM antibodies, very early or developing alveolar hemorrhage could present with a similar appearance however the presence of septal thickening may suggest edema and certainly atypical infections including viral pneumonia would present similarly. 2. Moderate bilateral pleural effusions. Adjacent areas of passive atelectasis. Cannot exclude underlying airspace disease. 3.  Atherosclerotic plaque within the normal caliber aorta. 4. Coronary artery calcifications are present. Please note that the presence of coronary artery calcium documents the presence of coronary artery disease, the severity of this disease and any potential stenosis cannot be assessed on this non-gated CT examination. Electronically Signed   By: Lovena Le M.D.   On: 03/24/2020 22:59   . sodium chloride   Intravenous Once  . amLODipine  10 mg Oral Daily  . Chlorhexidine Gluconate Cloth  6 each Topical Q0600  . cholecalciferol  1,000 Units Oral Daily  . cyclophosphamide  50 mg Oral q1800  . [START ON 03/29/2020] darbepoetin (ARANESP) injection - NON-DIALYSIS  40 mcg Subcutaneous Q Thu-1800  . docusate sodium  200 mg Oral BID  . heparin injection (subcutaneous)  5,000 Units Subcutaneous Q8H  . hydrALAZINE  100 mg Oral TID  . levothyroxine  88 mcg Oral Q0600  . metoprolol tartrate  100 mg Oral BID  . pantoprazole  40 mg Oral Daily  . polyethylene glycol  17 g Oral BID  . predniSONE  60 mg Oral Q breakfast  . sulfamethoxazole-trimethoprim  1 tablet Oral Q M,W,F    BMET    Component Value Date/Time   NA 136 03/26/2020 0605   NA 128 (L) 03/01/2020 0927   K 3.4 (L) 03/26/2020 0605   CL 95 (L) 03/26/2020 0605   CO2 26 03/26/2020 0605   GLUCOSE 95 03/26/2020 0605   BUN 54 (H) 03/26/2020 0605   BUN 67 (H) 03/01/2020 0927   CREATININE 5.01 (H) 03/26/2020 0605   CREATININE 0.84 12/21/2012 1552    CALCIUM 8.5 (L) 03/26/2020 0605   GFRNONAA 8 (L) 03/26/2020 0605   GFRNONAA 72 12/21/2012 1552   GFRAA 9 (L) 03/26/2020 0605   GFRAA 83 12/21/2012 1552   CBC    Component Value Date/Time   WBC 18.8 (H) 03/26/2020 0605   RBC 3.22 (L) 03/26/2020 0605   HGB 8.9 (L) 03/26/2020 0605   HGB 9.7 (L) 03/01/2020 0927   HCT 27.7 (L) 03/26/2020 0605   HCT 28.8 (L) 03/01/2020 0927   PLT 161 03/26/2020 0605   PLT 397 03/01/2020 0927   MCV 86.0 03/26/2020 0605   MCV 79 03/01/2020 0927   MCH 27.6 03/26/2020 0605   MCHC 32.1 03/26/2020 0605   RDW 14.4 03/26/2020 0605   RDW 14.7 03/01/2020 0927   LYMPHSABS 0.3 (L) 03/22/2020 0103   LYMPHSABS 1.3 03/01/2020 0927   MONOABS 0.3 03/22/2020 0103   EOSABS 0.0 03/22/2020 0103   EOSABS 0.2 03/01/2020 0927   BASOSABS 0.0 03/22/2020 0103   BASOSABS 0.1 03/01/2020 0927     Assessment/Plan:  1. 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  Started on  daily plasmapheresis and IV solumedrol/po cytoxan.  HD x2. 1. Biopsy with 100% crescents and significant amount of ATN. 2. Plan for HD again today due to tremors and rising BUN/Cr. 3. For pheresis with FFP due to possible alveolar hemorrhage after HD today. 4. Continue cytoxan 50 mg daily  5. Plan for 2 weeks of pheresis 6. Bactrim MWF for PCP prophylaxis 7. Continue to follow for renal recovery 2. Hyponatremia- due to hypovolemia and concomitant HCTZ use and AKI.  Now resolved. 3. Tremors- likely due to IV solumedrol 4. HTN- follow with HD and UF 5. Vascular access- RIJ TDC placed 03/20/20 by IR  [name], MD [facility] [phone]

## 2020-03-26 NOTE — Progress Notes (Signed)
Inpatient Rehab Admissions Coordinator:   Attempted to meet with pt at bedside.  Off unit for dialysis. Continue to follow for now, as pt continues to require daily assessment for HD needs, as well as daily pharesis; however, expect that she will not require CIR once medically ready.   Shann Medal, PT, DPT Admissions Coordinator [phone] 03/26/20  12:07 PM

## 2020-03-26 NOTE — Procedures (Signed)
Pt was seen and examined while on plasmapheresis with 1:1 FFP and is tolerating it well.  BP 159/79 with HR 77.  Afebrile and RIJ catheter in use.

## 2020-03-26 NOTE — Progress Notes (Signed)
PT Cancellation Note  Patient Details Name: Jasmine White MRN: [id number] DOB: 10-27-1945   Cancelled Treatment:    Reason Eval/Treat Not Completed: Patient at procedure or test/unavailable  Attempted to see x3 today and pt unavailable. Will follow-up 7/6   Arby Barrette, PT Pager [phone]  Rexanne Mano 03/26/2020, 5:04 PM

## 2020-03-26 NOTE — Progress Notes (Signed)
PROGRESS NOTE                                                                                                                                                                                                             Patient Demographics:    Jasmine White, is a 74 y.o. female, DOB - October 05, 1945, VVO:[id number]  Admit date - 03/17/2020   Admitting Physician Clance Boll, MD  Outpatient Primary MD for the patient is Chevis Pretty, [place]  LOS - 9  Chief Complaint  Patient presents with  . Emesis       Brief Narrative   -  Jasmine White is a 74 y.o. female with medical history significant of  Chronic back pain HTN, hypothyroidism, recently diagnosed CKD, HLD, Hx of COVID infection and recovery with persistent fatigue who presents with vomiting. Per patient states she has had intermittent n/v that started 9 days ago, in the ER she had changes suggestive of severe constipation and stool burden on CT scan, her blood numbers were suggestive of UTI, severe dehydration, hypernatremia and AKI.  She was admitted for further treatment.   Subjective:   Patient receiving dialysis, reports dyspnea is minimal today, denies any hemoptysis.  Reports some tremors   Assessment  & Plan :    AKI with severe dehydration and hyponatremia due to rapidly progressive abdomen nephritis from vasculitis with positive anti-GM antibody - CT scan does not show any acute obstruction. -Anti-GBM elevated at 137, MPO elevated at 10.3.  Note ANA also positive and anti-dsDNA elevated. -Treated with IV Solu-Medrol 1 g daily x3 daily, she is currently on prednisone . -Started on plasmapheresis, she is currently on pheresis with FFP's due to possible alveolar hemorrhage . -Hemodialysis  started on 6/30, and she was started hemodialysis on 6/29 after tunneled catheter inserted by IR. -Status post renal biopsy 7/1. 29 after tunneled catheter with IR.  First pheresis on 6/30.   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 -Management per renal -CT chest with nonspecific findings in bilateral areas with mosaic ground opacity and consolidative opacity, could be related to volume overload versus alveolar hemorrhage, so far she denies any hemoptysis, will continue to monitor closely. -She is currently on Cytoxan, continue with pheresis with FFP, given concern of developing alveolar hemorrhage, now increased daily pheresis x2 weeks tentatively. -On Bactrim prophylaxis Monday Wednesday Friday  UTI.   - Treated with Rocephin  Hyponatremia -Resolved  Elevated D-dimers -This is most likely in the setting of renal failure, and systematic inflammatory response, venous Dopplers negative, VQ scan is negative as well.  Essential hypertension.  - on metoprolol 100 mg twice daily, and Norvasc 10 mg, remains uncontrolled, so will increase hydralazine 200 mg oral 3 times daily   Chronic microcytic anemia.   -Iron, folate and B12 levels are acceptable, received Aranesp 7/1  -Bleeding 7 this morning, she received 1 unit PRBC transfusion 7/3.  Severe stool burden and constipation.   -Resolved  Weakness and deconditioning.  PT OT.  Hypothyroidism.  Continue home dose Synthroid, TSH is stable.    Condition - Extremely Guarded  Family Communication  : Discussed with daughter via phone 7/3  Code Status :  Full  Consults  :  None  Procedures  :   CT - 1. Large volume of inspissated fecal material noted throughout the colon. Mild distal colonic thickening versus underdistention of the sigmoid without evidence of inflammation. Correlate for features of constipation. Furthermore, could consider correlation with most recent colonoscopy or outpatient visualization if not recently performed. 2. Similar thickening versus underdistention of the stomach, could reflect gastritis or secondary change given patient's prolonged emesis. 3. Moderately distended  urinary bladder with layering air-fluid level. Could reflect recent instrumentation/catheterization, however if urinary symptoms are present, recommend further evaluation with urinalysis to exclude cystitis. 4. Cholelithiasis without evidence of acute cholecystitis. Mild focal thickening at the gallbladder fundus is suspected to reflect adenomyomatosis but could correlate with outpatient right upper quadrant ultrasound. Could perform on acute basis if biliary symptoms are present. 5. Aortic Atherosclerosis (ICD10-I70.0).  PUD Prophylaxis : PPI  Disposition Plan  :    Status is: Inpatient  Remains inpatient appropriate because:IV treatments appropriate due to intensity of illness or inability to take PO   Dispo: The patient is from: Home              Anticipated d/c is to: SNF              Anticipated d/c date is: 3 days              Patient currently is not medically stable to d/c.   DVT Prophylaxis  :   [place] heparin  Lab Results  Component Value Date   PLT 161 03/26/2020    Diet :  Diet Order            Diet renal with fluid restriction Fluid restriction: 1200 mL Fluid; Room service appropriate? Yes; Fluid consistency: Thin  Diet effective now                  Inpatient Medications Scheduled Meds: . sodium chloride   Intravenous Once  . amLODipine  10 mg Oral Daily  . calcium carbonate      . calcium carbonate  2 tablet Oral Q3H  . Chlorhexidine Gluconate Cloth  6 each Topical Q0600  . cholecalciferol  1,000 Units Oral Daily  . cyclophosphamide  50 mg Oral q1800  . [START ON 03/29/2020] darbepoetin (ARANESP) injection - NON-DIALYSIS  40 mcg Subcutaneous Q Thu-1800  . diphenhydrAMINE      . docusate sodium  200 mg Oral BID  . heparin injection (subcutaneous)  5,000 Units Subcutaneous Q8H  . hydrALAZINE  100 mg Oral TID  . levothyroxine  88 mcg Oral Q0600  . metoprolol tartrate  100 mg Oral BID  . pantoprazole  40 mg Oral Daily  . polyethylene glycol  17 g Oral  BID  .  predniSONE  60 mg Oral Q breakfast  . sulfamethoxazole-trimethoprim  1 tablet Oral Q M,W,F   Continuous Infusions: . anticoagulant sodium citrate    . calcium gluconate 3 g (03/26/20 1410)  . citrate dextrose    . citrate dextrose     PRN Meds:.acetaminophen, acetaminophen, albuterol, diphenhydrAMINE, heparin, [DISCONTINUED] ondansetron **OR** ondansetron (ZOFRAN) IV, technetium albumin aggregated  Antibiotics  :   Anti-infectives (From admission, onward)   Start     Dose/Rate Route Frequency Ordered Stop   03/26/20 1000  sulfamethoxazole-trimethoprim (BACTRIM) 400-80 MG per tablet 1 tablet     Discontinue     1 tablet Oral Every M-W-F 03/25/20 0954     03/25/20 2200  sulfamethoxazole-trimethoprim (BACTRIM) 400-80 MG per tablet 1 tablet  Status:  Discontinued        1 tablet Oral Daily at bedtime 03/25/20 0854 03/25/20 0921   03/20/20 1319  ceFAZolin (ANCEF) 2-4 GM/100ML-% IVPB       Note to Pharmacy: Arlean Hopping   : cabinet override      03/20/20 1319 03/21/20 0129   03/20/20 1100  ceFAZolin (ANCEF) IVPB 2g/100 mL premix        2 g 200 mL/hr over 30 Minutes Intravenous On call 03/20/20 1040 03/20/20 1506   03/19/20 0000  cefTRIAXone (ROCEPHIN) 1 g in sodium chloride 0.9 % 100 mL IVPB       Note to Pharmacy: 1st dose now   1 g 200 mL/hr over 30 Minutes Intravenous Every 24 hours 03/18/20 0127 03/21/20 0008   03/18/20 0145  cefTRIAXone (ROCEPHIN) 1 g in sodium chloride 0.9 % 100 mL IVPB       Note to Pharmacy: 1st dose now   1 g 200 mL/hr over 30 Minutes Intravenous  Once 03/18/20 0135 03/18/20 0252          Objective:   Vitals:   03/26/20 1433 03/26/20 1450 03/26/20 1501 03/26/20 1512  BP: (!) 169/87 (!) 157/80 (!) 169/87 (!) 163/84  Pulse: 88 79 84 85  Resp: (!) _0 (!) 23  Temp: 97.8 F (36.6 C) 97.6 F (36.4 C) 97.6 F (36.4 C) 97.8 F (36.6 C)  TempSrc: Oral Oral Oral Oral  SpO2:  96%    Weight:      Height:        SpO2: 96 % O2 Flow Rate (L/min):  2 L/min  Wt Readings from Last 3 Encounters:  03/26/20 60.7 kg  03/01/20 61.7 kg  09/02/19 60.3 kg     Intake/Output Summary (Last 24 hours) at 03/26/2020 1533 Last data filed at 03/26/2020 1315 Gross per 24 hour  Intake 588 ml  Output 2000 ml  Net -1412 ml     Physical Exam  Awake Alert, Oriented X 3, No new F.N deficits, Normal affect Symmetrical Chest wall movement, Good air movement bilaterally, scattered rales RRR,No Gallops,Rubs or new Murmurs, No Parasternal Heave +ve B.Sounds, Abd Soft, No tenderness, No rebound - guarding or rigidity. No Cyanosis, Clubbing or edema, No new Rash or bruise      Data Review:    Recent Labs  Lab 03/20/20 0419 03/20/20 0419 03/21/20 0509 03/21/20 0509 03/22/20 0103 03/22/20 0103 03/23/20 0815 03/23/20 0815 03/23/20 1247 03/24/20 0226 03/24/20 1725 03/25/20 0159 03/26/20 0605  WBC 11.5*   < > 10.7*   < > 14.0*   < > 15.8*  --   --  16.1* 18.8* 15.9* 18.8*  HGB 8.1*   < >  8.5*   < > 8.0*   < > 7.5*   < > 7.1* 7.0* 10.5* 9.2* 8.9*  HCT 23.2*   < > 24.7*   < > 22.9*   < > 22.9*   < > 21.0* 21.4* 31.8* 27.7* 27.7*  PLT 251   < > 289   < > 281   < > 217  --   --  184 198 165 161  MCV 76.8*   < > 76.7*   < > 78.2*   < > 82.1  --   --  83.6 84.8 83.9 86.0  MCH 26.8   < > 26.4   < > 27.3   < > 26.9  --   --  27.3 28.0 27.9 27.6  MCHC 34.9   < > 34.4   < > 34.9   < > 32.8  --   --  32.7 33.0 33.2 32.1  RDW 13.3   < > 13.6   < > 13.9   < > 14.4  --   --  14.4 14.0 14.1 14.4  LYMPHSABS 0.5*  --  0.2*  --  0.3*  --   --   --   --   --   --   --   --   MONOABS 0.4  --  0.0*  --  0.3  --   --   --   --   --   --   --   --   EOSABS 0.0  --  0.0  --  0.0  --   --   --   --   --   --   --   --   BASOSABS 0.0  --  0.0  --  0.0  --   --   --   --   --   --   --   --    < > = values in this interval not displayed.    Recent Labs  Lab 03/20/20 0419 03/20/20 0706 03/21/20 0509 03/21/20 0509 03/21/20 1931 03/22/20 0103 03/22/20 0103  03/23/20 0228 03/23/20 0228 03/23/20 0815 03/23/20 1247 03/24/20 0226 03/25/20 0159 03/26/20 0605  NA 118*   < > 128*   < >  --  135   < > 135   < > 135 135 137 135 136  K 3.6   < > 3.8   < >  --  3.4*   < > 4.6   < > 3.9 3.9 3.6 3.7 3.4*  CL 83*   < > 93*   < >  --  95*   < > 97*   < > 96* 95* 96* 95* 95*  CO2 15*   < > 18*   < >  --  28   < > 28  --  25  --  _0 GLUCOSE 90   < > 170*   < >  --  190*   < > 147*   < > 158* 148* 113* 112* 95  BUN 121*   < > 74*   < >  --  41*   < > 30*   < > 35* 37* 50* 29* 54*  CREATININE 8.93*   < > 6.82*   < >  --  4.54*   < > 3.32*   < > 3.85* 4.00* 4.68* 2.91* 5.01*  CALCIUM 8.2*   < > 8.5*   < >  --  8.8*   < > 8.7*  --  8.7*  --  8.9 8.7* 8.5*  AST 20  --  22  --   --  23  --   --   --   --   --   --   --   --   ALT 13  --  13  --   --  14  --   --   --   --   --   --   --   --   ALKPHOS 62  --  70  --   --  52  --   --   --   --   --   --   --   --   BILITOT 0.4  --  1.0  --   --  0.7  --   --   --   --   --   --   --   --   ALBUMIN 2.4*   < > 2.5*   < >  --  3.0*  --  3.1*  --   --   --  3.0* 3.0* 2.8*  MG 2.7*  --  2.6*  --   --  2.2  --   --   --   --   --   --   --   --   CRP 12.3*  --  19.2*  --   --  6.4*  --   --   --   --   --   --   --   --   DDIMER 3.65*  --  7.21*  --   --  3.62*  --   --   --   --   --   --   --   --   PROCALCITON 0.29  --   --   --   --   --   --   --   --   --   --   --   --   --   INR  --   --   --   --   --  1.2  --   --   --   --   --   --   --   --   HGBA1C  --   --   --   --  5.9*  --   --   --   --   --   --   --   --   --   BNP 2,535.5*  --  2,263.4*  --   --  3,320.9*  --   --   --   --   --   --   --   --    < > = values in this interval not displayed.    Recent Labs  Lab 03/20/20 0419 03/21/20 0509 03/22/20 0103  CRP 12.3* 19.2* 6.4*  DDIMER 3.65* 7.21* 3.62*  BNP 2,535.5* 2,263.4* 3,320.9*  PROCALCITON 0.29  --   --       ------------------------------------------------------------------------------------------------------------------ No results for input(s): CHOL, HDL, LDLCALC, TRIG, CHOLHDL, LDLDIRECT in the last 72 hours.  Lab Results  Component Value Date   HGBA1C 5.9 (H) 03/21/2020   ------------------------------------------------------------------------------------------------------------------ No results for input(s): TSH, T4TOTAL, T3FREE, THYROIDAB in the last 72 hours.  Invalid input(s): FREET3 ------------------------------------------------------------------------------------------------------------------ No results for input(s): VITAMINB12, FOLATE, FERRITIN, TIBC, IRON, RETICCTPCT in the last 72 hours.  Coagulation profile Recent  Labs  Lab 03/22/20 0103  INR 1.2    No results for input(s): DDIMER in the last 72 hours.  Cardiac Enzymes No results for input(s): CKMB, TROPONINI, MYOGLOBIN in the last 168 hours.  Invalid input(s): CK ------------------------------------------------------------------------------------------------------------------    Component Value Date/Time   BNP 3,320.9 (H) 03/22/2020 0103    Micro Results Recent Results (from the past 240 hour(s))  SARS Coronavirus 2 by RT PCR (hospital order, performed in [facility] hospital lab) Nasopharyngeal Nasopharyngeal Swab     Status: None   Collection Time: 03/17/20  7:18 PM   Specimen: Nasopharyngeal Swab  Result Value Ref Range Status   SARS Coronavirus 2 NEGATIVE NEGATIVE Final    Comment: (NOTE) SARS-CoV-2 target nucleic acids are NOT DETECTED.  The SARS-CoV-2 RNA is generally detectable in upper and lower respiratory specimens during the acute phase of infection. The lowest concentration of SARS-CoV-2 viral copies this assay can detect is 250 copies / mL. A negative result does not preclude SARS-CoV-2 infection and should not be used as the sole basis for treatment or other patient management decisions.  A  negative result may occur with improper specimen collection / handling, submission of specimen other than nasopharyngeal swab, presence of viral mutation(s) within the areas targeted by this assay, and inadequate number of viral copies (<250 copies / mL). A negative result must be combined with clinical observations, patient history, and epidemiological information.  Fact Sheet for Patients:   StrictlyIdeas.no  Fact Sheet for Healthcare Providers: BankingDealers.co.za  This test is not yet approved or  cleared by the [place] FDA and has been authorized for detection and/or diagnosis of SARS-CoV-2 by FDA under an Emergency Use Authorization (EUA).  This EUA will remain in effect (meaning this test can be used) for the duration of the COVID-19 declaration under Section 564(b)(1) of the Act, 21 U.S.C. section 360bbb-3(b)(1), unless the authorization is terminated or revoked sooner.  Performed at [facility], [address] [place], [place] [address]   MRSA PCR Screening     Status: None   Collection Time: 03/17/20 11:47 PM   Specimen: Nasal Mucosa; Nasopharyngeal  Result Value Ref Range Status   MRSA by PCR NEGATIVE NEGATIVE Final    Comment:        The GeneXpert MRSA Assay (FDA approved for NASAL specimens only), is one component of a comprehensive MRSA colonization surveillance program. It is not intended to diagnose MRSA infection nor to guide or monitor treatment for MRSA infections. Performed at [facility], [place] [address], [place], [place] [address]   Culture, Urine     Status: None   Collection Time: 03/19/20 10:43 AM   Specimen: Urine, Random  Result Value Ref Range Status   Specimen Description URINE, RANDOM  Final   Special Requests NONE  Final   Culture   Final    NO GROWTH Performed at [facility], [place] [address], [place], [place] [address]    Report Status 03/20/2020 FINAL   Final    Radiology Reports CT Abdomen Pelvis Wo Contrast  Result Date: 03/17/2020 CLINICAL DATA:  Vomiting for 1 week, low sella diam, elevated BUN and creatinine EXAM: CT ABDOMEN AND PELVIS WITHOUT CONTRAST TECHNIQUE: Multidetector CT imaging of the abdomen and pelvis was performed following the standard protocol without IV contrast. COMPARISON:  Chest radiograph 12/26/2014, lumbar radiograph 08/13/2012, MR lumbar spine 06/16/2012 FINDINGS: Lower chest: Scarring and reticular changes noted in the lung bases, left greater than  right. No consolidative opacity or pleural effusion. Normal heart size. No pericardial effusion. Tiny benign appearing macrocalcification versus fiducial in the right breast. Hepatobiliary: No visible focal liver lesions. Smooth liver surface contour. Normal hepatic attenuation. Gallbladder is moderately distended. Some focal thickening is noted towards the gallbladder fundus, possible adenomyomatosis. A partially calcified gallstone noted layering dependently as well. No pericholecystic fluid or inflammation. Normal caliber biliary tree without visible calcified intraductal gallstones. Pancreas: Moderate pancreatic atrophy. No inflammation or ductal dilatation. No discernible pancreatic lesions. Spleen: Normal in size without focal abnormality. Adrenals/Urinary Tract: No concerning adrenal lesions. No visible or contour deforming renal lesions. No urolithiasis or hydronephrosis. Urinary bladder is moderately distended with layering air-fluid level. No significant pericholecystic inflammation or urinary bladder wall thickening. Stomach/Bowel: Question some mild thickening versus underdistention of the stomach. Duodenum takes a normal course across the midline abdomen. No small bowel thickening or dilatation. Large volume of inspissated fecal material noted throughout the colon. Question some mild distal colonic thickening versus underdistention of the sigmoid. No adjacent inflammation,  free fluid or evidence of perforation. No high-grade bowel obstruction. Vascular/Lymphatic: Atherosclerotic calcifications throughout the abdominal aorta and branch vessels. No aneurysm or ectasia. No enlarged abdominopelvic lymph nodes. Reproductive: Uterus is surgically absent. No concerning adnexal lesions. Other: No abdominopelvic free fluid or free gas. No bowel containing hernias. Small fat containing umbilical hernia. Musculoskeletal: Prior L3-L5 posterior spinal fusion. No evidence of hardware failure or acute complication is seen. Mild anterior wedging and Schmorl's node at the L1 superior endplate is similar to comparison radiographs. Diffuse discogenic and facet degenerative changes noted throughout the lumbar spine at both instrumented in non instrumented levels. Additional degenerative changes in the hips and pelvis. The osseous structures appear diffusely demineralized which may limit detection of small or nondisplaced fractures. No acute or worrisome osseous lesions. IMPRESSION: 1. Large volume of inspissated fecal material noted throughout the colon. Mild distal colonic thickening versus underdistention of the sigmoid without evidence of inflammation. Correlate for features of constipation. Furthermore, could consider correlation with most recent colonoscopy or outpatient visualization if not recently performed. 2. Similar thickening versus underdistention of the stomach, could reflect gastritis or secondary change given patient's prolonged emesis. 3. Moderately distended urinary bladder with layering air-fluid level. Could reflect recent instrumentation/catheterization, however if urinary symptoms are present, recommend further evaluation with urinalysis to exclude cystitis. 4. Cholelithiasis without evidence of acute cholecystitis. Mild focal thickening at the gallbladder fundus is suspected to reflect adenomyomatosis but could correlate with outpatient right upper quadrant ultrasound. Could  perform on acute basis if biliary symptoms are present. 5. Aortic Atherosclerosis (ICD10-I70.0). Electronically Signed   By: Lovena Le M.D.   On: 03/17/2020 19:37   X-ray chest PA and lateral  Result Date: 03/18/2020 CLINICAL DATA:  Acute renal insufficiency EXAM: CHEST - 2 VIEW COMPARISON:  December 26, 2014 FINDINGS: No pneumothorax. The cardiomediastinal silhouette is normal. No pulmonary nodules or masses. Mild bibasilar opacities, likely atelectasis. No overt edema. IMPRESSION: Probable mild bibasilar atelectasis. Recommend follow-up to resolution. No other acute abnormalities. Electronically Signed   By: Dorise Bullion III M.D   On: 03/18/2020 08:56   CT CHEST WO CONTRAST  Result Date: 03/24/2020 CLINICAL DATA:  Shortness of breath, positive anti GBM, assess for alveolar hemorrhage EXAM: CT CHEST WITHOUT CONTRAST TECHNIQUE: Multidetector CT imaging of the chest was performed following the standard protocol without IV contrast. COMPARISON:  Radiograph 03/19/2020 FINDINGS: Cardiovascular: Normal heart size. No pericardial effusion. Extensive coronary artery calcifications are present. Additional calcifications  present on the aortic leaflets. Hypoattenuation the cardiac blood pool relative to the myocardium compatible with anemia. Atherosclerotic plaque within the normal caliber aorta. Normal 3 vessel branching of the aortic arch with extensive calcification of the proximal great vessels. Central pulmonary arteries are normal caliber. Luminal evaluation precluded in the absence of contrast media. A tunneled right IJ central venous catheter tip terminates at the level of the superior cavoatrial junction. No other major venous abnormality. Mediastinum/Nodes: Numerous subcentimeter low-attenuation mediastinal nodes are present favored to be reactive. No concerning axillary adenopathy. Hilar nodal evaluation limited in the absence of intravenous contrast media. No acute abnormality of the trachea or esophagus.  No concerning thyroid nodules. Drastic inlet is unremarkable. Lungs/Pleura: Central airways are unremarkable. There are mosaic areas of mixed ground-glass and early developing consolidative opacity throughout both lungs most prominent centrally in the left upper lobe, superior segment left lower lobe and right upper lobe. Some mild septal thickening is noted towards the apices. Biapical pleuroparenchymal scarring is noted as well. There are moderate bilateral pleural effusions with adjacent areas of passive atelectasis. Underlying airspace disease in the regions of volume loss is difficult to exclude. Upper Abdomen: No acute abnormalities present in the visualized portions of the upper abdomen. Musculoskeletal: The osseous structures appear diffusely demineralized which may limit detection of small or nondisplaced fractures. Superior endplate deformity at L1 is likely related to a large Schmorl's node. No acute or worrisome osseous lesions. Multilevel degenerative changes are present in the imaged portions of the spine. Additional degenerative changes in the shoulders. Scattered fiducials and likely benign macro calcifications noted in the bilateral breasts. No concerning chest wall lesions. IMPRESSION: 1. Bilateral areas of mosaic ground-glass and consolidative opacity are nonspecific. In the setting of anti GBM antibodies, very early or developing alveolar hemorrhage could present with a similar appearance however the presence of septal thickening may suggest edema and certainly atypical infections including viral pneumonia would present similarly. 2. Moderate bilateral pleural effusions. Adjacent areas of passive atelectasis. Cannot exclude underlying airspace disease. 3.  Atherosclerotic plaque within the normal caliber aorta. 4. Coronary artery calcifications are present. Please note that the presence of coronary artery calcium documents the presence of coronary artery disease, the severity of this disease and  any potential stenosis cannot be assessed on this non-gated CT examination. Electronically Signed   By: Lovena Le M.D.   On: 03/24/2020 22:59   NM Pulmonary Perfusion  Result Date: 03/22/2020 CLINICAL DATA:  Elevated D-dimer level, acute renal failure. EXAM: NUCLEAR MEDICINE PERFUSION LUNG SCAN TECHNIQUE: Perfusion images were obtained in multiple projections after intravenous injection of radiopharmaceutical. Ventilation scans intentionally deferred if perfusion scan and chest x-ray adequate for interpretation during COVID 19 epidemic. RADIOPHARMACEUTICALS:  4.3 mCi Tc-44mMAA IV COMPARISON:  Chest radiograph 03/22/2020 FINDINGS: Small bandlike regions of hypoperfusion in the mid lungs bilaterally, not compelling for wedge-shaped, accordingly not considered of substantial suspicion for acute pulmonary embolus. IMPRESSION: No overtly wedge-shaped perfusion defects. By PISAPED criteria this is typically classified as "pulmonary embolism absent." Electronically Signed   By: WVan ClinesM.D.   On: 03/22/2020 18:33   IR Fluoro Guide CV Line Right  Result Date: 03/20/2020 INDICATION: 74year old female referred for hemodialysis catheter placement EXAM: IMAGE GUIDED PLACEMENT OF TUNNELED HEMODIALYSIS CATHETER MEDICATIONS: 2 g Ancef. The antibiotic was given in an appropriate time interval prior to skin puncture. ANESTHESIA/SEDATION: Moderate (conscious) sedation was employed during this procedure. A total of Versed 0.5 mg and Fentanyl 12.5 mcg was administered intravenously.  Moderate Sedation Time: 19 minutes. The patient's level of consciousness and vital signs were monitored continuously by radiology nursing throughout the procedure under my direct supervision. FLUOROSCOPY TIME:  Fluoroscopy Time: 0 minutes 36 seconds (1 mGy). COMPLICATIONS: None PROCEDURE: Informed written consent was obtained from the patient after a discussion of the risks, benefits, and alternatives to treatment. Questions regarding  the procedure were encouraged and answered. The right neck and chest were prepped with chlorhexidine in a sterile fashion, and a sterile drape was applied covering the operative field. Maximum barrier sterile technique with sterile gowns and gloves were used for the procedure. A timeout was performed prior to the initiation of the procedure. Ultrasound survey was performed. Micropuncture kit was utilized to access the right internal jugular vein under direct, real-time ultrasound guidance after the overlying soft tissues were anesthetized with 1% lidocaine with epinephrine. Stab incision was made with 11 blade scalpel. Microwire was passed centrally. The microwire was then marked to measure appropriate internal catheter length. External tunneled length was estimated. A total tip to cuff length of 19 cm was selected. 035 guidewire was advanced to the level of the IVC. Skin and subcutaneous tissues of chest wall below the clavicle were generously infiltrated with 1% lidocaine for local anesthesia. A small stab incision was made with 11 blade scalpel. The selected hemodialysis catheter was tunneled in a retrograde fashion from the anterior chest wall to the venotomy incision. Serial dilation was performed and then a peel-away sheath was placed. The catheter was then placed through the peel-away sheath with tips ultimately positioned within the superior aspect of the right atrium. Final catheter positioning was confirmed and documented with a spot radiographic image. The catheter aspirates and flushes normally. The catheter was flushed with appropriate volume heparin dwells. The catheter exit site was secured with a 0-Prolene retention suture. Gel-Foam slurry was infused into the soft tissue tract. The venotomy incision was closed Derma bond and sterile dressing. Dressings were applied at the chest wall. Patient tolerated the procedure well and remained hemodynamically stable throughout. No complications were  encountered and no significant blood loss encountered. IMPRESSION: Status post right IJ tunneled hemodialysis catheter placement. Catheter ready for use. Signed, Dulcy Fanny. Dellia Nims, RPVI [facility] Radiology Electronically Signed   By: Corrie Mckusick D.O.   On: 03/20/2020 15:27   IR US Guide Vasc Access Right  Result Date: 03/20/2020 INDICATION: 74 year old female referred for hemodialysis catheter placement EXAM: IMAGE GUIDED PLACEMENT OF TUNNELED HEMODIALYSIS CATHETER MEDICATIONS: 2 g Ancef. The antibiotic was given in an appropriate time interval prior to skin puncture. ANESTHESIA/SEDATION: Moderate (conscious) sedation was employed during this procedure. A total of Versed 0.5 mg and Fentanyl 12.5 mcg was administered intravenously. Moderate Sedation Time: 19 minutes. The patient's level of consciousness and vital signs were monitored continuously by radiology nursing throughout the procedure under my direct supervision. FLUOROSCOPY TIME:  Fluoroscopy Time: 0 minutes 36 seconds (1 mGy). COMPLICATIONS: None PROCEDURE: Informed written consent was obtained from the patient after a discussion of the risks, benefits, and alternatives to treatment. Questions regarding the procedure were encouraged and answered. The right neck and chest were prepped with chlorhexidine in a sterile fashion, and a sterile drape was applied covering the operative field. Maximum barrier sterile technique with sterile gowns and gloves were used for the procedure. A timeout was performed prior to the initiation of the procedure. Ultrasound survey was performed. Micropuncture kit was utilized to access the right internal jugular vein under  direct, real-time ultrasound guidance after the overlying soft tissues were anesthetized with 1% lidocaine with epinephrine. Stab incision was made with 11 blade scalpel. Microwire was passed centrally. The microwire was then marked to measure  appropriate internal catheter length. External tunneled length was estimated. A total tip to cuff length of 19 cm was selected. 035 guidewire was advanced to the level of the IVC. Skin and subcutaneous tissues of chest wall below the clavicle were generously infiltrated with 1% lidocaine for local anesthesia. A small stab incision was made with 11 blade scalpel. The selected hemodialysis catheter was tunneled in a retrograde fashion from the anterior chest wall to the venotomy incision. Serial dilation was performed and then a peel-away sheath was placed. The catheter was then placed through the peel-away sheath with tips ultimately positioned within the superior aspect of the right atrium. Final catheter positioning was confirmed and documented with a spot radiographic image. The catheter aspirates and flushes normally. The catheter was flushed with appropriate volume heparin dwells. The catheter exit site was secured with a 0-Prolene retention suture. Gel-Foam slurry was infused into the soft tissue tract. The venotomy incision was closed Derma bond and sterile dressing. Dressings were applied at the chest wall. Patient tolerated the procedure well and remained hemodynamically stable throughout. No complications were encountered and no significant blood loss encountered. IMPRESSION: Status post right IJ tunneled hemodialysis catheter placement. Catheter ready for use. Signed, Dulcy Fanny. Dellia Nims, RPVI [facility] Radiology Electronically Signed   By: Corrie Mckusick D.O.   On: 03/20/2020 15:27   DG Chest Port 1 View  Result Date: 03/22/2020 CLINICAL DATA:  Elevated D-dimer level. EXAM: PORTABLE CHEST 1 VIEW COMPARISON:  Chest radiograph 03/19/2020 FINDINGS: New left perihilar bilateral infrahilar patchy airspace opacities. Blunting of the costophrenic angles bilaterally. Dialysis catheter tip: Lower SVC. No visible pneumothorax. Mild thoracic spondylosis.  IMPRESSION: 1. New left perihilar and infrahilar patchy airspace opacities. This could represent bilateral pneumonia or asymmetric noncardiogenic edema. 2. Small bilateral pleural effusions. 3. New right IJ dialysis catheter tip: SVC.  No pneumothorax. Electronically Signed   By: Van Clines M.D.   On: 03/22/2020 18:34   DG Chest Port 1 View  Result Date: 03/19/2020 CLINICAL DATA:  Shortness of breath. EXAM: PORTABLE CHEST 1 VIEW COMPARISON:  03/18/2020 FINDINGS: The cardiac silhouette, mediastinal and hilar contours are within normal limits and stable. Stable underlying emphysematous changes and pulmonary scarring. Suspect streaky bibasilar infiltrates. No pleural effusions. No worrisome pulmonary lesions. IMPRESSION: 1. Underlying emphysematous changes and pulmonary scarring. 2. Suspect streaky bibasilar infiltrates. Electronically Signed   By: Marijo Sanes M.D.   On: 03/19/2020 07:59   US BIOPSY (KIDNEY)  Result Date: 03/22/2020 INDICATION: 41 year old with acute kidney injury.  Request for renal biopsy. EXAM: ULTRASOUND-GUIDED RANDOM LEFT RENAL BIOPSY MEDICATIONS: None. ANESTHESIA/SEDATION: Moderate (conscious) sedation was employed during this procedure. A total of Versed 1.0 mg and Fentanyl 50 mcg was administered intravenously. Moderate Sedation Time: 14 minutes. The patient's level of consciousness and vital signs were monitored continuously by radiology nursing throughout the procedure under my direct supervision. FLUOROSCOPY TIME:  None COMPLICATIONS: None immediate. PROCEDURE: Informed written consent was obtained from the patient after a thorough discussion of the procedural risks, benefits and alternatives. All questions were addressed. A timeout was performed prior to the initiation of the procedure. Patient was placed prone. Both kidneys were evaluated with ultrasound. Left kidney was selected for biopsy. Left flank was prepped with chlorhexidine and sterile field  was created. Skin and  soft tissues were anesthetized with 1% lidocaine. Small skin incision was made. Using ultrasound guidance, 16 gauge core biopsy needle was directed into the left kidney lower pole. Core biopsy was obtained and placed in saline. A second core biopsy was obtained using ultrasound guidance from the left kidney lower pole. Second core biopsy was adequate and placed in saline. Bandage placed over the puncture site. FINDINGS: Perinephric edema involving both kidneys prior to the procedure. No significant bleeding or hematoma formation from the left kidney following the core biopsies. Two adequate core biopsies were obtained. IMPRESSION: Ultrasound-guided core biopsy of left kidney lower pole. Electronically Signed   By: Markus Daft M.D.   On: 03/22/2020 10:31   VAS [place] LOWER EXTREMITY VENOUS (DVT)  Result Date: 03/22/2020  Lower Venous DVTStudy Indications: Elevated Ddimer.  Risk Factors: None identified. Comparison Study: No prior studies. Performing Technologist: Oliver Hum RVT  Examination Guidelines: A complete evaluation includes B-mode imaging, spectral Doppler, color Doppler, and power Doppler as needed of all accessible portions of each vessel. Bilateral testing is considered an integral part of a complete examination. Limited examinations for reoccurring indications may be performed as noted. The reflux portion of the exam is performed with the patient in reverse Trendelenburg.  +---------+---------------+---------+-----------+----------+--------------+ RIGHT    CompressibilityPhasicitySpontaneityPropertiesThrombus Aging +---------+---------------+---------+-----------+----------+--------------+ CFV      Full           Yes      Yes                                 +---------+---------------+---------+-----------+----------+--------------+ SFJ      Full                                                        +---------+---------------+---------+-----------+----------+--------------+ FV  Prox  Full                                                        +---------+---------------+---------+-----------+----------+--------------+ FV Mid   Full                                                        +---------+---------------+---------+-----------+----------+--------------+ FV DistalFull                                                        +---------+---------------+---------+-----------+----------+--------------+ PFV      Full                                                        +---------+---------------+---------+-----------+----------+--------------+ POP      Full  Yes      Yes                                 +---------+---------------+---------+-----------+----------+--------------+ PTV      Full                                                        +---------+---------------+---------+-----------+----------+--------------+ PERO     Full                                                        +---------+---------------+---------+-----------+----------+--------------+   +---------+---------------+---------+-----------+----------+--------------+ LEFT     CompressibilityPhasicitySpontaneityPropertiesThrombus Aging +---------+---------------+---------+-----------+----------+--------------+ CFV      Full           Yes      Yes                                 +---------+---------------+---------+-----------+----------+--------------+ SFJ      Full                                                        +---------+---------------+---------+-----------+----------+--------------+ FV Prox  Full                                                        +---------+---------------+---------+-----------+----------+--------------+ FV Mid   Full                                                        +---------+---------------+---------+-----------+----------+--------------+ FV DistalFull                                                         +---------+---------------+---------+-----------+----------+--------------+ PFV      Full                                                        +---------+---------------+---------+-----------+----------+--------------+ POP      Full           Yes      Yes                                 +---------+---------------+---------+-----------+----------+--------------+ PTV  Full                                                        +---------+---------------+---------+-----------+----------+--------------+ PERO     Full                                                        +---------+---------------+---------+-----------+----------+--------------+     Summary: RIGHT: - There is no evidence of deep vein thrombosis in the lower extremity.  - No cystic structure found in the popliteal fossa.  LEFT: - There is no evidence of deep vein thrombosis in the lower extremity.  - No cystic structure found in the popliteal fossa.  *See table(s) above for measurements and observations. Electronically signed by Servando Snare MD on 03/22/2020 at 4:37:23 PM.    Final    US Abdomen Limited RUQ  Result Date: 03/18/2020 CLINICAL DATA:  Nausea and vomiting EXAM: ULTRASOUND ABDOMEN LIMITED RIGHT UPPER QUADRANT COMPARISON:  CT abdomen and pelvis 03/17/2020 FINDINGS: Gallbladder: Large shadowing calculus within gallbladder 19 mm diameter. No gallbladder wall thickening, pericholecystic fluid or sonographic Murphy sign. Common bile duct: Diameter: 3 mm, normal Liver: Normal appearance. No mass or nodularity. Portal vein is patent on color Doppler imaging with normal direction of blood flow towards the liver. Other: Trace perihepatic free fluid. IMPRESSION: Cholelithiasis without evidence of cholecystitis. Trace ascites adjacent to liver. Electronically Signed   By: Lavonia Dana M.D.   On: 03/18/2020 13:32     Phillips Climes M.D on 03/26/2020 at 3:33 PM  To page go to [email or web address] -  password [facility]

## 2020-03-26 NOTE — Progress Notes (Signed)
Occupational Therapy Treatment Patient Details Name: Jasmine White MRN: [id number] DOB: August 09, 1946 Today's Date: 03/26/2020    History of present illness 74 y.o. female with medical history significant of chronic back pain, HTN, hypothyroidism, recently diagnosed CKD, HLD, Hx of COVID infection and recovery with persistent fatigue who presented 03/18/20 with nausea/vomiting x 9 days. Severe hypovolemic hyponatremia, AKI, UTI, HTN   OT comments  Patient supine in bed on arrival and appearing much more cognitively clear than previous visits.  She was responding quickly and mood was upbeat.  Able to get to EOB with supervision and stand with min guard and RW.  Walked in room ~40 ft with min guard.  Patient began to feel dizzy shortly after starting to walk but wanted to keep going.  After sitting patient still feeling dizzy and tired from walk.  Would benefit from further therapy to increase activity tolerance and continue increasing independence.  Patient is very motivated to work with therapy.  Will continue to follow with OT acutely to address the deficits listed below.    Follow Up Recommendations  CIR    Equipment Recommendations  None recommended by OT    Recommendations for Other Services      Precautions / Restrictions Precautions Precautions: Fall Precaution Comments: dizziness/nausea with activity Restrictions Weight Bearing Restrictions: No       Mobility Bed Mobility Overal bed mobility: Needs Assistance Bed Mobility: Supine to Sit;Sit to Supine     Supine to sit: Supervision Sit to supine: Modified independent (Device/Increase time)      Transfers Overall transfer level: Needs assistance Equipment used: Rolling walker (2 wheeled) Transfers: Sit to/from Stand Sit to Stand: Supervision              Balance Overall balance assessment: Needs assistance Sitting-balance support: No upper extremity supported;Feet supported Sitting balance-Leahy Scale: Good      Standing balance support: Bilateral upper extremity supported Standing balance-Leahy Scale: Fair                             ADL either performed or assessed with clinical judgement   ADL Overall ADL's : Needs assistance/impaired                     Lower Body Dressing: Sit to/from stand;Min guard               Functional mobility during ADLs: Min guard;Rolling walker General ADL Comments: Getting dizzy/lightheaded during walking     Vision       Perception     Praxis      Cognition Arousal/Alertness: Awake/alert Behavior During Therapy: WFL for tasks assessed/performed Overall Cognitive Status: Within Functional Limits for tasks assessed                                 General Comments: Seeming much more cognitively clear and upbeat        Exercises     Shoulder Instructions       General Comments BP 164/76 sitting, 150/118 standing.  RA and SpO2 95    Pertinent Vitals/ Pain       Pain Assessment: No/denies pain  Home Living  Prior Functioning/Environment              Frequency  Min 2X/week        Progress Toward Goals  OT Goals(current goals can now be found in the care plan section)  Progress towards OT goals: Progressing toward goals  Acute Rehab OT Goals Patient Stated Goal: get strong enough to go home OT Goal Formulation: With patient Time For Goal Achievement: 04/02/20 Potential to Achieve Goals: Good  Plan Discharge plan remains appropriate    Co-evaluation                 AM-PAC OT "6 Clicks" Daily Activity     Outcome Measure   Help from another person eating meals?: None Help from another person taking care of personal grooming?: A Little Help from another person toileting, which includes using toliet, bedpan, or urinal?: A Little Help from another person bathing (including washing, rinsing, drying)?: A Little Help  from another person to put on and taking off regular upper body clothing?: A Little Help from another person to put on and taking off regular lower body clothing?: A Little 6 Click Score: 19    End of Session Equipment Utilized During Treatment: Rolling walker  OT Visit Diagnosis: Unsteadiness on feet (R26.81);Muscle weakness (generalized) (M62.81);Other symptoms and signs involving cognitive function   Activity Tolerance Patient tolerated treatment well;Treatment limited secondary to medical complications (Comment) (dizziness)   Patient Left in bed;with call bell/phone within reach;with bed alarm set   Nurse Communication Mobility status        Time: 6950-7225 OT Time Calculation (min): 15 min  Charges: OT General Charges $OT Visit: 1 Visit OT Treatments $Therapeutic Activity: 8-22 mins  August Luz, OTR/L    Phylliss Bob 03/26/2020, 12:28 PM

## 2020-03-27 LAB — THERAPEUTIC PLASMA EXCHANGE (BLOOD BANK)
Plasma Exchange: 3003
Plasma volume needed: 3000
Unit division: 0
Unit division: 0
Unit division: 0
Unit division: 0
Unit division: 0
Unit division: 0
Unit division: 0
Unit division: 0
Unit division: 0

## 2020-03-27 LAB — RENAL FUNCTION PANEL
Albumin: 2.8 g/dL — ABNORMAL LOW (ref 3.5–5.0)
Anion gap: 16 — ABNORMAL HIGH (ref 5–15)
BUN: 33 mg/dL — ABNORMAL HIGH (ref 8–23)
CO2: 28 mmol/L (ref 22–32)
Calcium: 8.2 mg/dL — ABNORMAL LOW (ref 8.9–10.3)
Chloride: 93 mmol/L — ABNORMAL LOW (ref 98–111)
Creatinine, Ser: 3.73 mg/dL — ABNORMAL HIGH (ref 0.44–1.00)
GFR calc Af Amer: 13 mL/min — ABNORMAL LOW (ref >60)
GFR calc non Af Amer: 11 mL/min — ABNORMAL LOW (ref >60)
Glucose, Bld: 146 mg/dL — ABNORMAL HIGH (ref 70–99)
Phosphorus: 2.7 mg/dL (ref 2.5–4.6)
Potassium: 3 mmol/L — ABNORMAL LOW (ref 3.5–5.1)
Sodium: 137 mmol/L (ref 135–145)

## 2020-03-27 LAB — CBC
HCT: 27.7 % — ABNORMAL LOW (ref 36.0–46.0)
Hemoglobin: 8.8 g/dL — ABNORMAL LOW (ref 12.0–15.0)
MCH: 27.5 pg (ref 26.0–34.0)
MCHC: 31.8 g/dL (ref 30.0–36.0)
MCV: 86.6 fL (ref 80.0–100.0)
Platelets: 138 10*3/uL — ABNORMAL LOW (ref 150–400)
RBC: 3.2 MIL/uL — ABNORMAL LOW (ref 3.87–5.11)
RDW: 14.6 % (ref 11.5–15.5)
WBC: 18 10*3/uL — ABNORMAL HIGH (ref 4.0–10.5)
nRBC: 0 % (ref 0.0–0.2)

## 2020-03-27 MED ORDER — CALCIUM GLUCONATE-NACL 2-0.675 GM/100ML-% IV SOLN
2.0000 g | Freq: Once | INTRAVENOUS | Status: AC
  Administered 2020-03-27: 2000 mg via INTRAVENOUS
  Filled 2020-03-27: qty 100

## 2020-03-27 MED ORDER — DIPHENHYDRAMINE HCL 25 MG PO CAPS
25.0000 mg | ORAL_CAPSULE | Freq: Four times a day (QID) | ORAL | Status: DC | PRN
  Administered 2020-03-27: 25 mg via ORAL

## 2020-03-27 MED ORDER — ACD FORMULA A 0.73-2.45-2.2 GM/100ML VI SOLN: 1000.0000 mL | Status: DC

## 2020-03-27 MED ORDER — DIPHENHYDRAMINE HCL 25 MG PO CAPS
ORAL_CAPSULE | ORAL | Status: AC
  Administered 2020-03-27: 25 mg via ORAL
  Filled 2020-03-27: qty 1

## 2020-03-27 MED ORDER — ACETAMINOPHEN 325 MG PO TABS: 650.0000 mg | ORAL_TABLET | ORAL | Status: DC | PRN

## 2020-03-27 MED ORDER — ACETAMINOPHEN 325 MG PO TABS
ORAL_TABLET | ORAL | Status: AC
  Administered 2020-03-27: 650 mg
  Filled 2020-03-27: qty 2

## 2020-03-27 MED ORDER — CALCIUM CARBONATE ANTACID 500 MG PO CHEW
2.0000 | CHEWABLE_TABLET | ORAL | Status: DC
  Administered 2020-03-27: 400 mg via ORAL
  Filled 2020-03-27: qty 2

## 2020-03-27 MED ORDER — ACD FORMULA A 0.73-2.45-2.2 GM/100ML VI SOLN
Status: AC
  Filled 2020-03-27: qty 500

## 2020-03-27 MED ORDER — ANTICOAGULANT SODIUM CITRATE 4% (200MG/5ML) IV SOLN
5.0000 mL | Freq: Once | Status: AC
  Administered 2020-03-27: 5 mL
  Filled 2020-03-27: qty 5

## 2020-03-27 MED ORDER — CALCIUM CARBONATE ANTACID 500 MG PO CHEW
CHEWABLE_TABLET | ORAL | Status: AC
  Administered 2020-03-27: 400 mg via ORAL
  Filled 2020-03-27: qty 2

## 2020-03-27 NOTE — Progress Notes (Signed)
Patient ID: Jasmine White, female   DOB: [birth year], 74 y.o.   MRN: [id number] S: Feels much better today.  No more tremors after HD yesterday and reports that she is urinating and moving her bowels.  O:BP (!) 163/80 (BP Location: Right Arm)   Pulse 65   Temp 98 F (36.7 C) (Oral)   Resp 13   Ht 5\' 6"  (1.676 m)   Wt 62 kg   SpO2 97%   BMI 22.06 kg/m   Intake/Output Summary (Last 24 hours) at 03/27/2020 0829 Last data filed at 03/27/2020 0700 Gross per 24 hour  Intake 560 ml  Output 2002 ml  Net -1442 ml   Intake/Output: I/O last 3 completed shifts: In: 71 [P.O.:980] Out: 2002 [Other:2000; Stool:2]  Intake/Output this shift:  No intake/output data recorded. Weight change: -0.3 kg Gen:NAD CVS: RRR no rub Resp: cta Abd: +BS, soft, NT/ND Ext: no edema  Recent Labs  Lab 03/21/20 0509 03/21/20 0509 03/22/20 0103 03/22/20 0103 03/23/20 0228 03/23/20 0815 03/23/20 1247 03/24/20 0226 03/25/20 0159 03/26/20 0605 03/26/20 1516  NA 128*   < > 135   < > 135 135 135 137 135 136 136  K 3.8   < > 3.4*   < > 4.6 3.9 3.9 3.6 3.7 3.4* 4.0  CL 93*   < > 95*   < > 97* 96* 95* 96* 95* 95* 98  CO2 18*   < > 28  --  28 25  --  30 29 26 28   GLUCOSE 170*   < > 190*   < > 147* 158* 148* 113* 112* 95 109*  BUN 74*   < > 41*   < > 30* 35* 37* 50* 29* 54* 14  CREATININE 6.82*   < > 4.54*   < > 3.32* 3.85* 4.00* 4.68* 2.91* 5.01* 2.10*  ALBUMIN 2.5*  --  3.0*  --  3.1*  --   --  3.0* 3.0* 2.8*  --   CALCIUM 8.5*   < > 8.8*  --  8.7* 8.7*  --  8.9 8.7* 8.5* 8.0*  PHOS 7.5*  --  5.3*  --  3.7  --   --  3.8 2.7 3.1  --   AST 22  --  23  --   --   --   --   --   --   --   --   ALT 13  --  14  --   --   --   --   --   --   --   --    < > = values in this interval not displayed.   Liver Function Tests: Recent Labs  Lab 03/21/20 0509 03/21/20 0509 03/22/20 0103 03/23/20 0228 03/24/20 0226 03/25/20 0159 03/26/20 0605  AST 22  --  23  --   --   --   --   ALT 13  --  14  --   --   --   --    ALKPHOS 70  --  52  --   --   --   --   BILITOT 1.0  --  0.7  --   --   --   --   PROT 6.7  --  6.3*  --   --   --   --   ALBUMIN 2.5*   < > 3.0*   < > 3.0* 3.0* 2.8*   < > = values in this interval  not displayed.   No results for input(s): LIPASE, AMYLASE in the last 168 hours. No results for input(s): AMMONIA in the last 168 hours. CBC: Recent Labs  Lab 03/21/20 0509 03/21/20 0509 03/22/20 0103 03/22/20 0103 03/23/20 0815 03/23/20 1247 03/24/20 0226 03/24/20 0226 03/24/20 1725 03/25/20 0159 03/26/20 0605  WBC 10.7*   < > 14.0*   < > 15.8*  --  16.1*   < > 18.8* 15.9* 18.8*  NEUTROABS 10.3*  --  13.2*  --   --   --   --   --   --   --   --   HGB 8.5*   < > 8.0*   < > 7.5*   < > 7.0*   < > 10.5* 9.2* 8.9*  HCT 24.7*   < > 22.9*   < > 22.9*   < > 21.4*   < > 31.8* 27.7* 27.7*  MCV 76.7*   < > 78.2*   < > 82.1  --  83.6  --  84.8 83.9 86.0  PLT 289   < > 281   < > 217  --  184   < > 198 165 161   < > = values in this interval not displayed.   Cardiac Enzymes: No results for input(s): CKTOTAL, CKMB, CKMBINDEX, TROPONINI in the last 168 hours. CBG: Recent Labs  Lab 03/20/20 2355  GLUCAP 109*    Iron Studies: No results for input(s): IRON, TIBC, TRANSFERRIN, FERRITIN in the last 72 hours. Studies/Results: No results found. . sodium chloride   Intravenous Once  . amLODipine  10 mg Oral Daily  . calcium carbonate  2 tablet Oral Q3H  . Chlorhexidine Gluconate Cloth  6 each Topical Q0600  . cholecalciferol  1,000 Units Oral Daily  . cyclophosphamide  50 mg Oral q1800  . [START ON 03/29/2020] darbepoetin (ARANESP) injection - NON-DIALYSIS  40 mcg Subcutaneous Q Thu-1800  . docusate sodium  200 mg Oral BID  . heparin injection (subcutaneous)  5,000 Units Subcutaneous Q8H  . hydrALAZINE  100 mg Oral TID  . levothyroxine  88 mcg Oral Q0600  . metoprolol tartrate  100 mg Oral BID  . pantoprazole  40 mg Oral Daily  . polyethylene glycol  17 g Oral BID  . predniSONE  60 mg  Oral Q breakfast  . sulfamethoxazole-trimethoprim  1 tablet Oral Q M,W,F    BMET    Component Value Date/Time   NA 136 03/26/2020 1516   NA 128 (L) 03/01/2020 0927   K 4.0 03/26/2020 1516   CL 98 03/26/2020 1516   CO2 28 03/26/2020 1516   GLUCOSE 109 (H) 03/26/2020 1516   BUN 14 03/26/2020 1516   BUN 67 (H) 03/01/2020 0927   CREATININE 2.10 (H) 03/26/2020 1516   CREATININE 0.84 12/21/2012 1552   CALCIUM 8.0 (L) 03/26/2020 1516   GFRNONAA 23 (L) 03/26/2020 1516   GFRNONAA 72 12/21/2012 1552   GFRAA 26 (L) 03/26/2020 1516   GFRAA 83 12/21/2012 1552   CBC    Component Value Date/Time   WBC 18.8 (H) 03/26/2020 0605   RBC 3.22 (L) 03/26/2020 0605   HGB 8.9 (L) 03/26/2020 0605   HGB 9.7 (L) 03/01/2020 0927   HCT 27.7 (L) 03/26/2020 0605   HCT 28.8 (L) 03/01/2020 0927   PLT 161 03/26/2020 0605   PLT 397 03/01/2020 0927   MCV 86.0 03/26/2020 0605   MCV 79 03/01/2020 0927   MCH 27.6 03/26/2020 4696  MCHC 32.1 03/26/2020 0605   RDW 14.4 03/26/2020 0605   RDW 14.7 03/01/2020 0927   LYMPHSABS 0.3 (L) 03/22/2020 0103   LYMPHSABS 1.3 03/01/2020 0927   MONOABS 0.3 03/22/2020 0103   EOSABS 0.0 03/22/2020 0103   EOSABS 0.2 03/01/2020 0927   BASOSABS 0.0 03/22/2020 0103   BASOSABS 0.1 03/01/2020 0927    Assessment/Plan:  1. 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  Started on daily plasmapheresis and IV solumedrol/po cytoxan.  HD x2. 1. Biopsy with 100% crescents and significant amount of ATN. 2. Plan to continue with HD on MWF schedule for now. 3. For pheresis with FFP due to possible alveolar hemorrhage daily. 4. Continue cytoxan 50 mg daily  5. Plan for 2 weeks of pheresis 6. Bactrim MWF for PCP prophylaxis 7. Continue to follow for renal recovery 2. Hyponatremia- due to hypovolemia and concomitant HCTZ use and AKI.  Now resolved. 3. Tremors- likely due to IV solumedrol 4. HTN- follow with HD and UF 5. Vascular access- RIJ  TDC placed 03/20/20 by IR  [name], MD [facility] [phone]

## 2020-03-27 NOTE — Progress Notes (Signed)
Patient sitting upright in chair with visitor at her side.

## 2020-03-27 NOTE — Progress Notes (Signed)
Renal Navigator out of office 7/2 and 03/26/20. SW covering referred patient to [facility], however, unfortunately this unit no longer exists. Navigator contacted [facility] and asked that referral be sent to [place] in [place]. Navigator spoke with patient in HD unit. She states understanding and says her family is able to take her anywhere she can get a seat for HD. She needs TPE for approximately another week. Navigator informed OP HD clinic/[facility] that she will need to start mid next week. Staff report they should have a seat available at that time. Per Admissions Coordinator at [facility], they are in need of HepB panel and demographics. These records have been faxed at this time. Navigator will follow closely regarding patient's OP HD seat.  Alphonzo Cruise, [place] Renal Navigator [phone]

## 2020-03-27 NOTE — TOC Initial Note (Signed)
Transition of Care [facility]) - Initial/Assessment Note    Patient Details  Name: Jasmine White MRN: [id number] Date of Birth: 1946-08-23  [facility] CM/SW Contact:    Carles Collet, RN Phone Number: 03/27/2020, 3:43 PM  Clinical Narrative:       Damaris Schooner w patient. Discussed DC plan. Plan will be for DC to home w [facility] services. Awaiting CLIP, likely to [facility] in [place]. HH services arranged through [facility]. TOC will continue to for DC needs.            Expected Discharge Plan: [place] Barriers to Discharge: Continued Medical Work up   Patient Goals and CMS Choice Patient states their goals for this hospitalization and ongoing recovery are:: to go home CMS Medicare.gov Compare Post Acute Care list provided to:: Patient Choice offered to / list presented to : Patient  Expected Discharge Plan and Services Expected Discharge Plan: [place] Choice: [place] arrangements for the past 2 months: Single Family Home                           HH Arranged: PT, OT HH Agency: [facility] Date [facility] Agency Contacted: 03/27/20 Time HH Agency Contacted: [phone] Representative spoke with at [facility]: Tommi Rumps  Prior Living Arrangements/Services Living arrangements for the past 2 months: [place] Lives with:: Spouse          Need for Family Participation in Patient Care: Yes (Comment) Care giver support system in place?: Yes (comment)   Criminal Activity/Legal Involvement Pertinent to Current Situation/Hospitalization: No - Comment as needed  Activities of Daily Living Home Assistive Devices/Equipment: None ADL Screening (condition at time of admission) Patient's cognitive ability adequate to safely complete daily activities?: Yes Is the patient deaf or have difficulty hearing?: Yes Does the patient have difficulty seeing, even when wearing glasses/contacts?: No Does the patient have difficulty  concentrating, remembering, or making decisions?: No Patient able to express need for assistance with ADLs?: Yes Does the patient have difficulty dressing or bathing?: Yes Independently performs ADLs?: Yes (appropriate for developmental age) Does the patient have difficulty walking or climbing stairs?: No Weakness of Legs: None Weakness of Arms/Hands: None  Permission Sought/Granted                  Emotional Assessment Appearance:: Appears stated age     Orientation: : Oriented to Self, Oriented to Place, Oriented to  Time, Oriented to Situation Alcohol / Substance Use: Not Applicable Psych Involvement: No (comment)  Admission diagnosis:  Hyponatremia [E87.1] Acute renal failure, unspecified acute renal failure type ([place]) [N17.9] Intractable vomiting with nausea, unspecified vomiting type [R11.2] Patient Active Problem List   Diagnosis Date Noted  . Hyponatremia 03/17/2020  . Primary insomnia 01/20/2019  . BMI 27.0-27.9,adult 06/29/2015  . Chronic back pain 07/06/2014  . Essential hypertension, benign 12/21/2012  . Hyperlipidemia with target LDL less than 130 12/21/2012  . Hypothyroidism 12/21/2012  . Depression 12/21/2012   PCP:  Chevis Pretty, FNP Pharmacy:   [facility], [place] [place] [id number] Phone: [phone] Fax: [phone]  [facility], [address] [place] [address] Phone: [phone] Fax: [phone]  [id number] [place], [address]  [place] [address] Phone: [phone] Fax: [phone]     Social Determinants of Health (SDOH) Interventions    Readmission Risk Interventions No flowsheet data found.

## 2020-03-27 NOTE — Progress Notes (Addendum)
Jasmine White called from dialysis for report regarding patient. Patient was given her diphenhydramine 25 mg tylenol.   BP: 162/89  Hr: 89  Temp: 98.3  Tomorrow she will received HD and plasmapheresis  She has had 800 mg Tums  She advised she will need to be picked up earlier tomorrow morning in order to get both in.   Will make sure next shift RN is aware.    DST RN

## 2020-03-27 NOTE — Progress Notes (Signed)
Patient ambulated to door with tech today and was very happy that she was able to get that far.  Patient was also very happy that she had put out urine today approximately 200 ml

## 2020-03-27 NOTE — Progress Notes (Signed)
Physical Therapy Vestibular Assessment and Treatment   Clinical Impression: Per previous PT's assessment, pt with some symptoms consistent with peripheral vestibular disorder. Assessment today demonstrated Left posterior canal BPPV with positive response to left Epley maneuver. Also noted impaired vestibular-occular reflex (VOR), which can result with prolonged BPPV. Patient will benefit from further assessment to determine if further cannalith repositioning is indicated and to progress towards exercises for VOR.    03/27/20 1537  Symptom Behavior  Subjective history of current problem Pt reports she gets dizzy/lightheaded when she changes positions (lasting only seconds). Reports she has a hard time knowing where her feet are going to land (feels like she can't tell where the floor is going to be. States sometimes she feels the room spinning.   Type of Dizziness  Spinning;"Funny feeling in head"  Frequency of Dizziness multiple times per day  Duration of Dizziness seconds  Symptom Nature Motion provoked  Aggravating Factors Lying supine;Supine to sit;Sit to stand;Forward bending  Relieving Factors Head stationary;Closing eyes  Progression of Symptoms Better  History of similar episodes Years ago; never treated/diagnosed  Oculomotor Exam  Oculomotor Alignment Abnormal (rt eye briefly remains adducted after eye ROM; then resets)  Ocular ROM WNL  Spontaneous Absent  Gaze-induced  Absent  Smooth Pursuits Saccades  Saccades Intact  Vestibulo-Ocular Reflex  VOR to Slow Head Movement Positive bilaterally  Comment +symptomatic with slow VOR; +diplopia with eyes looking right  Auditory  Comments reports rt ear has had decr hearing, "feels stopped up" for 10 yrs  Positional Testing  Dix-Hallpike Dix-Hallpike Right;Dix-Hallpike Left  Dix-Hallpike Right  Dix-Hallpike Right Duration 0  Dix-Hallpike Right Symptoms No nystagmus;Other (comment) (no symptoms)  Dix-Hallpike Left  Dix-Hallpike Left  Duration 25 seconds  Dix-Hallpike Left Symptoms No nystagmus;Other (comment) (room spinning)      03/27/20 0001  Vestibular Treatment/Exercise  Vestibular Treatment Provided Canalith Repositioning  Canalith Repositioning Epley Manuever Left   EPLEY MANUEVER LEFT  Number of Reps  1  Overall Response  Improved Symptoms   RESPONSE DETAILS LEFT +symptoms position 1 and 4     03/27/20 1553  PT Time Calculation  PT Start Time (ACUTE ONLY) 1457  PT Stop Time (ACUTE ONLY) 1528  PT Time Calculation (min) (ACUTE ONLY) 31 min  PT General Charges  $$ ACUTE PT VISIT 1 Visit  PT Treatments  $Self Care/Home Management 8-22  $Canalith Rep Proc 8-22 mins    Arby Barrette, PT Pager [phone]

## 2020-03-27 NOTE — Progress Notes (Signed)
Occupational Therapy Treatment Patient Details Name: CASI WESTERFELD MRN: [id number] DOB: 01-30-1946 Today's Date: 03/27/2020    History of present illness 74 y.o. female with medical history significant of chronic back pain, HTN, hypothyroidism, recently diagnosed CKD, HLD, Hx of COVID infection and recovery with persistent fatigue who presented 03/18/20 with nausea/vomiting x 9 days. Severe hypovolemic hyponatremia, AKI, UTI, HTN   OT comments  Patient supine in bed on arrival.  She was willing to participate with therapy and happy to wash face/complete grooming at sink.  Completed LB dressing with min guard and able to stand with min guard and RW use.  Patient still having dizziness with mobility, though nausea has improved.  She states dizziness gets worse with prolonged standing.  Patient showing improvement overall and would benefit from continued therapy to further work on activity tolerance, balance and ADLs for increased safety.   Follow Up Recommendations  CIR    Equipment Recommendations  None recommended by OT    Recommendations for Other Services      Precautions / Restrictions Precautions Precautions: Fall Precaution Comments: dizziness/nausea with activity Restrictions Weight Bearing Restrictions: No       Mobility Bed Mobility Overal bed mobility: Needs Assistance Bed Mobility: Supine to Sit     Supine to sit: Supervision        Transfers Overall transfer level: Needs assistance Equipment used: Rolling walker (2 wheeled) Transfers: Sit to/from Stand Sit to Stand: Supervision         General transfer comment: Cueing for proper walker use/hand placement    Balance Overall balance assessment: Needs assistance Sitting-balance support: No upper extremity supported;Feet supported Sitting balance-Leahy Scale: Good Sitting balance - Comments: Bend forward and don socks   Standing balance support: Bilateral upper extremity supported;During functional  activity Standing balance-Leahy Scale: Fair                             ADL either performed or assessed with clinical judgement   ADL Overall ADL's : Needs assistance/impaired     Grooming: Wash/dry hands;Wash/dry face;Oral care;Min guard;Standing               Lower Body Dressing: Min guard;Sitting/lateral leans               Functional mobility during ADLs: Min guard;Rolling walker General ADL Comments: Getting dizzy/lightheaded during walking     Vision       Perception     Praxis      Cognition Arousal/Alertness: Awake/alert Behavior During Therapy: WFL for tasks assessed/performed Overall Cognitive Status: Within Functional Limits for tasks assessed                                 General Comments: Seeming much more cognitively clear and upbeat        Exercises     Shoulder Instructions       General Comments      Pertinent Vitals/ Pain       Pain Assessment: No/denies pain  Home Living                                          Prior Functioning/Environment              Frequency  Min 2X/week  Progress Toward Goals  OT Goals(current goals can now be found in the care plan section)  Progress towards OT goals: Progressing toward goals  Acute Rehab OT Goals Patient Stated Goal: get strong enough to go home OT Goal Formulation: With patient Time For Goal Achievement: 04/02/20 Potential to Achieve Goals: Good  Plan Discharge plan remains appropriate    Co-evaluation                 AM-PAC OT "6 Clicks" Daily Activity     Outcome Measure   Help from another person eating meals?: None Help from another person taking care of personal grooming?: A Little Help from another person toileting, which includes using toliet, bedpan, or urinal?: A Little Help from another person bathing (including washing, rinsing, drying)?: A Little Help from another person to put on and  taking off regular upper body clothing?: A Little Help from another person to put on and taking off regular lower body clothing?: A Little 6 Click Score: 19    End of Session Equipment Utilized During Treatment: Rolling walker  OT Visit Diagnosis: Unsteadiness on feet (R26.81);Muscle weakness (generalized) (M62.81);Other symptoms and signs involving cognitive function   Activity Tolerance Patient tolerated treatment well   Patient Left in chair;with call bell/phone within reach;with chair alarm set   Nurse Communication Mobility status        Time: 1214-1229 OT Time Calculation (min): 15 min  Charges: OT General Charges $OT Visit: 1 Visit OT Treatments $Self Care/Home Management : 8-22 mins  August Luz, OTR/L    Phylliss Bob 03/27/2020, 3:31 PM

## 2020-03-27 NOTE — Progress Notes (Signed)
PROGRESS NOTE                                                                                                                                                                                                             Patient Demographics:    Jasmine White, is a 74 y.o. female, DOB - 12/16/1945, QTT:[id number]  Admit date - 03/17/2020   Admitting Physician Clance Boll, MD  Outpatient Primary MD for the patient is Chevis Pretty, [place]  LOS - 10  Chief Complaint  Patient presents with  . Emesis       Brief Narrative   -  Jasmine White is a 74 y.o. female with medical history significant of  Chronic back pain HTN, hypothyroidism, recently diagnosed CKD, HLD, Hx of COVID infection and recovery with persistent fatigue who presents with vomiting. Per patient states she has had intermittent n/v that started 9 days ago, in the ER she had changes suggestive of severe constipation and stool burden on CT scan, her blood numbers were suggestive of UTI, severe dehydration, hypernatremia and AKI.  She was admitted for further treatment.  -Patient work-up significant for acute renal failure, with positive anti-GBM, for which she was started on hemodialysis and plasmapheresis, she was treated with IV steroids as well, she is currently on Cytoxan, CT chest with some patient of alveolar hemorrhage, so her plasmapheresis was changed with FFP as well.   Subjective:   Patient receiving dialysis, reports dyspnea is minimal today, denies any hemoptysis.  Reports tremors has resolved.   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 -Anti-GBM elevated at 137, MPO elevated at 10.3.  Note ANA also positive and anti-dsDNA elevated. -Treated with IV Solu-Medrol 1 g daily x3 daily, she is currently on prednisone . -Started on  plasmapheresis, she is currently on pheresis with FFP's due to possible alveolar hemorrhage . -Hemodialysis  started on 6/30, and she was started hemodialysis on 6/29 after tunneled catheter inserted by IR. -Status post renal biopsy 7/1. 29 after tunneled catheter with IR.  First pheresis on 6/30.   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 -Management per renal -CT chest with nonspecific findings in bilateral areas with mosaic ground opacity and consolidative opacity, could be related to volume overload versus alveolar hemorrhage, so  far she denies any hemoptysis, will continue to monitor closely. -She is currently on Cytoxan, continue with pheresis with FFP, given concern of developing alveolar hemorrhage, now increased daily pheresis x2 weeks tentatively. -On Bactrim prophylaxis Monday Wednesday Friday -Reports her tremor has resolved after dialysis back-to-back.  UTI.   - Treated with Rocephin  Hyponatremia -Resolved  Elevated D-dimers -This is most likely in the setting of renal failure, and systematic inflammatory response, venous Dopplers negative, VQ scan is negative as well.  Essential hypertension.  - on metoprolol 100 mg twice daily, and Norvasc 10 mg, remains uncontrolled, I have increased her hydralazine to 100 mg oral 3 times daily   Chronic microcytic anemia.   -Iron, folate and B12 levels are acceptable, received Aranesp 7/1  -Bleeding 7 this morning, she received 1 unit PRBC transfusion 7/3.  Severe stool burden and constipation.   -Resolved  Weakness and deconditioning.  PT OT.  Hypothyroidism.  Continue home dose Synthroid, TSH is stable.    Condition - Extremely Guarded  Family Communication  : Discussed with daughter via phone 7/3  Code Status :  Full  Consults  :  None  Procedures  :   CT - 1. Large volume of inspissated fecal material noted throughout the colon. Mild distal colonic thickening  versus underdistention of the sigmoid without evidence of inflammation. Correlate for features of constipation. Furthermore, could consider correlation with most recent colonoscopy or outpatient visualization if not recently performed. 2. Similar thickening versus underdistention of the stomach, could reflect gastritis or secondary change given patient's prolonged emesis. 3. Moderately distended urinary bladder with layering air-fluid level. Could reflect recent instrumentation/catheterization, however if urinary symptoms are present, recommend further evaluation with urinalysis to exclude cystitis. 4. Cholelithiasis without evidence of acute cholecystitis. Mild focal thickening at the gallbladder fundus is suspected to reflect adenomyomatosis but could correlate with outpatient right upper quadrant ultrasound. Could perform on acute basis if biliary symptoms are present. 5. Aortic Atherosclerosis (ICD10-I70.0).  PUD Prophylaxis : PPI  Disposition Plan  :    Status is: Inpatient  Remains inpatient appropriate because:IV treatments appropriate due to intensity of illness or inability to take PO   Dispo: The patient is from: Home              Anticipated d/c is to: SNF              Anticipated d/c date is: 3 days              Patient currently is not medically stable to d/c.   DVT Prophylaxis  :   [place] heparin  Lab Results  Component Value Date   PLT 138 (L) 03/27/2020    Diet :  Diet Order            Diet renal with fluid restriction Fluid restriction: 1200 mL Fluid; Room service appropriate? Yes; Fluid consistency: Thin  Diet effective now                  Inpatient Medications Scheduled Meds: . sodium chloride   Intravenous Once  . amLODipine  10 mg Oral Daily  . Chlorhexidine Gluconate Cloth  6 each Topical Q0600  . cholecalciferol  1,000 Units Oral Daily  . cyclophosphamide  50 mg Oral q1800  . [START ON 03/29/2020] darbepoetin (ARANESP) injection - NON-DIALYSIS  40 mcg  Subcutaneous Q Thu-1800  . docusate sodium  200 mg Oral BID  . heparin injection (subcutaneous)  5,000 Units Subcutaneous Q8H  .  hydrALAZINE  100 mg Oral TID  . levothyroxine  88 mcg Oral Q0600  . metoprolol tartrate  100 mg Oral BID  . pantoprazole  40 mg Oral Daily  . polyethylene glycol  17 g Oral BID  . predniSONE  60 mg Oral Q breakfast  . sulfamethoxazole-trimethoprim  1 tablet Oral Q M,W,F   Continuous Infusions: . citrate dextrose     PRN Meds:.acetaminophen, albuterol, [DISCONTINUED] ondansetron **OR** ondansetron (ZOFRAN) IV, technetium albumin aggregated  Antibiotics  :   Anti-infectives (From admission, onward)   Start     Dose/Rate Route Frequency Ordered Stop   03/26/20 1000  sulfamethoxazole-trimethoprim (BACTRIM) 400-80 MG per tablet 1 tablet     Discontinue     1 tablet Oral Every M-W-F 03/25/20 0954     03/25/20 2200  sulfamethoxazole-trimethoprim (BACTRIM) 400-80 MG per tablet 1 tablet  Status:  Discontinued        1 tablet Oral Daily at bedtime 03/25/20 0854 03/25/20 0921   03/20/20 1319  ceFAZolin (ANCEF) 2-4 GM/100ML-% IVPB       Note to Pharmacy: Arlean Hopping   : cabinet override      03/20/20 1319 03/21/20 0129   03/20/20 1100  ceFAZolin (ANCEF) IVPB 2g/100 mL premix        2 g 200 mL/hr over 30 Minutes Intravenous On call 03/20/20 1040 03/20/20 1506   03/19/20 0000  cefTRIAXone (ROCEPHIN) 1 g in sodium chloride 0.9 % 100 mL IVPB       Note to Pharmacy: 1st dose now   1 g 200 mL/hr over 30 Minutes Intravenous Every 24 hours 03/18/20 0127 03/21/20 0008   03/18/20 0145  cefTRIAXone (ROCEPHIN) 1 g in sodium chloride 0.9 % 100 mL IVPB       Note to Pharmacy: 1st dose now   1 g 200 mL/hr over 30 Minutes Intravenous  Once 03/18/20 0135 03/18/20 0252          Objective:   Vitals:   03/27/20 1110 03/27/20 1117 03/27/20 1125 03/27/20 1214  BP: (!) 152/83 (!) 156/74 (!) 162/89 (!) 172/73  Pulse: 81 81 79 85  Resp: 17 (!) 21 (!) 21 (!) 23  Temp: 98.6  F (37 C) 98.3 F (36.8 C) 98.3 F (36.8 C) 98.3 F (36.8 C)  TempSrc: Oral Oral Oral Oral  SpO2:   96% 96%  Weight:      Height:        SpO2: 96 % O2 Flow Rate (L/min): 2 L/min  Wt Readings from Last 3 Encounters:  03/27/20 62 kg  03/01/20 61.7 kg  09/02/19 60.3 kg     Intake/Output Summary (Last 24 hours) at 03/27/2020 1550 Last data filed at 03/27/2020 0700 Gross per 24 hour  Intake 560 ml  Output 2 ml  Net 558 ml     Physical Exam  Awake Alert, Oriented X 3, No new F.N deficits, Normal affect Symmetrical Chest wall movement, Good air movement bilaterally, scattered rales. RRR,No Gallops,Rubs or new Murmurs, No Parasternal Heave +ve B.Sounds, Abd Soft, No tenderness, No rebound - guarding or rigidity. No Cyanosis, Clubbing or edema, No new Rash or bruise      Data Review:    Recent Labs  Lab 03/21/20 0509 03/21/20 0509 03/22/20 0103 03/23/20 0815 03/24/20 0226 03/24/20 1725 03/25/20 0159 03/26/20 0605 03/27/20 1235  WBC 10.7*   < > 14.0*   < > 16.1* 18.8* 15.9* 18.8* 18.0*  HGB 8.5*   < > 8.0*   < >  7.0* 10.5* 9.2* 8.9* 8.8*  HCT 24.7*   < > 22.9*   < > 21.4* 31.8* 27.7* 27.7* 27.7*  PLT 289   < > 281   < > 184 198 165 161 138*  MCV 76.7*   < > 78.2*   < > 83.6 84.8 83.9 86.0 86.6  MCH 26.4   < > 27.3   < > 27.3 28.0 27.9 27.6 27.5  MCHC 34.4   < > 34.9   < > 32.7 33.0 33.2 32.1 31.8  RDW 13.6   < > 13.9   < > 14.4 14.0 14.1 14.4 14.6  LYMPHSABS 0.2*  --  0.3*  --   --   --   --   --   --   MONOABS 0.0*  --  0.3  --   --   --   --   --   --   EOSABS 0.0  --  0.0  --   --   --   --   --   --   BASOSABS 0.0  --  0.0  --   --   --   --   --   --    < > = values in this interval not displayed.    Recent Labs  Lab 03/21/20 0509 03/21/20 0509 03/21/20 1931 03/22/20 0103 03/22/20 0103 03/23/20 0228 03/23/20 0815 03/24/20 0226 03/25/20 0159 03/26/20 0605 03/26/20 1516 03/27/20 1125  NA 128*   < >  --  135   < > 135   < > 137 135 136 136 137  K  3.8   < >  --  3.4*   < > 4.6   < > 3.6 3.7 3.4* 4.0 3.0*  CL 93*   < >  --  95*   < > 97*   < > 96* 95* 95* 98 93*  CO2 18*   < >  --  28   < > 28   < > _0 GLUCOSE 170*   < >  --  190*   < > 147*   < > 113* 112* 95 109* 146*  BUN 74*   < >  --  41*   < > 30*   < > 50* 29* 54* 14 33*  CREATININE 6.82*   < >  --  4.54*   < > 3.32*   < > 4.68* 2.91* 5.01* 2.10* 3.73*  CALCIUM 8.5*   < >  --  8.8*   < > 8.7*   < > 8.9 8.7* 8.5* 8.0* 8.2*  AST 22  --   --  23  --   --   --   --   --   --   --   --   ALT 13  --   --  14  --   --   --   --   --   --   --   --   ALKPHOS 70  --   --  52  --   --   --   --   --   --   --   --   BILITOT 1.0  --   --  0.7  --   --   --   --   --   --   --   --   ALBUMIN 2.5*   < >  --  3.0*   < >  3.1*  --  3.0* 3.0* 2.8*  --  2.8*  MG 2.6*  --   --  2.2  --   --   --   --   --   --   --   --   CRP 19.2*  --   --  6.4*  --   --   --   --   --   --   --   --   DDIMER 7.21*  --   --  3.62*  --   --   --   --   --   --   --   --   INR  --   --   --  1.2  --   --   --   --   --   --   --   --   HGBA1C  --   --  5.9*  --   --   --   --   --   --   --   --   --   BNP 2,263.4*  --   --  3,320.9*  --   --   --   --   --   --   --   --    < > = values in this interval not displayed.    Recent Labs  Lab 03/21/20 0509 03/22/20 0103  CRP 19.2* 6.4*  DDIMER 7.21* 3.62*  BNP 2,263.4* 3,320.9*    ------------------------------------------------------------------------------------------------------------------ No results for input(s): CHOL, HDL, LDLCALC, TRIG, CHOLHDL, LDLDIRECT in the last 72 hours.  Lab Results  Component Value Date   HGBA1C 5.9 (H) 03/21/2020   ------------------------------------------------------------------------------------------------------------------ No results for input(s): TSH, T4TOTAL, T3FREE, THYROIDAB in the last 72 hours.  Invalid input(s):  FREET3 ------------------------------------------------------------------------------------------------------------------ No results for input(s): VITAMINB12, FOLATE, FERRITIN, TIBC, IRON, RETICCTPCT in the last 72 hours.  Coagulation profile Recent Labs  Lab 03/22/20 0103  INR 1.2    No results for input(s): DDIMER in the last 72 hours.  Cardiac Enzymes No results for input(s): CKMB, TROPONINI, MYOGLOBIN in the last 168 hours.  Invalid input(s): CK ------------------------------------------------------------------------------------------------------------------    Component Value Date/Time   BNP 3,320.9 (H) 03/22/2020 0103    Micro Results Recent Results (from the past 240 hour(s))  SARS Coronavirus 2 by RT PCR (hospital order, performed in [facility] hospital lab) Nasopharyngeal Nasopharyngeal Swab     Status: None   Collection Time: 03/17/20  7:18 PM   Specimen: Nasopharyngeal Swab  Result Value Ref Range Status   SARS Coronavirus 2 NEGATIVE NEGATIVE Final    Comment: (NOTE) SARS-CoV-2 target nucleic acids are NOT DETECTED.  The SARS-CoV-2 RNA is generally detectable in upper and lower respiratory specimens during the acute phase of infection. The lowest concentration of SARS-CoV-2 viral copies this assay can detect is 250 copies / mL. A negative result does not preclude SARS-CoV-2 infection and should not be used as the sole basis for treatment or other patient management decisions.  A negative result may occur with improper specimen collection / handling, submission of specimen other than nasopharyngeal swab, presence of viral mutation(s) within the areas targeted by this assay, and inadequate number of viral copies (<250 copies / mL). A negative result must be combined with clinical observations, patient history, and epidemiological information.  Fact Sheet for Patients:   StrictlyIdeas.no  Fact Sheet for Healthcare  Providers: BankingDealers.co.za  This test is not yet approved or  cleared by the [place] FDA and  has been authorized for detection and/or diagnosis of SARS-CoV-2 by FDA under an Emergency Use Authorization (EUA).  This EUA will remain in effect (meaning this test can be used) for the duration of the COVID-19 declaration under Section 564(b)(1) of the Act, 21 U.S.C. section 360bbb-3(b)(1), unless the authorization is terminated or revoked sooner.  Performed at [facility], [address] [place], [place] [address]   MRSA PCR Screening     Status: None   Collection Time: 03/17/20 11:47 PM   Specimen: Nasal Mucosa; Nasopharyngeal  Result Value Ref Range Status   MRSA by PCR NEGATIVE NEGATIVE Final    Comment:        The GeneXpert MRSA Assay (FDA approved for NASAL specimens only), is one component of a comprehensive MRSA colonization surveillance program. It is not intended to diagnose MRSA infection nor to guide or monitor treatment for MRSA infections. Performed at [facility], [place] [address], [place], [place] [address]   Culture, Urine     Status: None   Collection Time: 03/19/20 10:43 AM   Specimen: Urine, Random  Result Value Ref Range Status   Specimen Description URINE, RANDOM  Final   Special Requests NONE  Final   Culture   Final    NO GROWTH Performed at [facility], [place] [address], [place], [place] [address]    Report Status 03/20/2020 FINAL  Final    Radiology Reports CT Abdomen Pelvis Wo Contrast  Result Date: 03/17/2020 CLINICAL DATA:  Vomiting for 1 week, low sella diam, elevated BUN and creatinine EXAM: CT ABDOMEN AND PELVIS WITHOUT CONTRAST TECHNIQUE: Multidetector CT imaging of the abdomen and pelvis was performed following the standard protocol without IV contrast. COMPARISON:  Chest radiograph 12/26/2014, lumbar radiograph 08/13/2012, MR lumbar spine 06/16/2012 FINDINGS: Lower chest: Scarring and  reticular changes noted in the lung bases, left greater than right. No consolidative opacity or pleural effusion. Normal heart size. No pericardial effusion. Tiny benign appearing macrocalcification versus fiducial in the right breast. Hepatobiliary: No visible focal liver lesions. Smooth liver surface contour. Normal hepatic attenuation. Gallbladder is moderately distended. Some focal thickening is noted towards the gallbladder fundus, possible adenomyomatosis. A partially calcified gallstone noted layering dependently as well. No pericholecystic fluid or inflammation. Normal caliber biliary tree without visible calcified intraductal gallstones. Pancreas: Moderate pancreatic atrophy. No inflammation or ductal dilatation. No discernible pancreatic lesions. Spleen: Normal in size without focal abnormality. Adrenals/Urinary Tract: No concerning adrenal lesions. No visible or contour deforming renal lesions. No urolithiasis or hydronephrosis. Urinary bladder is moderately distended with layering air-fluid level. No significant pericholecystic inflammation or urinary bladder wall thickening. Stomach/Bowel: Question some mild thickening versus underdistention of the stomach. Duodenum takes a normal course across the midline abdomen. No small bowel thickening or dilatation. Large volume of inspissated fecal material noted throughout the colon. Question some mild distal colonic thickening versus underdistention of the sigmoid. No adjacent inflammation, free fluid or evidence of perforation. No high-grade bowel obstruction. Vascular/Lymphatic: Atherosclerotic calcifications throughout the abdominal aorta and branch vessels. No aneurysm or ectasia. No enlarged abdominopelvic lymph nodes. Reproductive: Uterus is surgically absent. No concerning adnexal lesions. Other: No abdominopelvic free fluid or free gas. No bowel containing hernias. Small fat containing umbilical hernia. Musculoskeletal: Prior L3-L5 posterior spinal  fusion. No evidence of hardware failure or acute complication is seen. Mild anterior wedging and Schmorl's node at the L1 superior endplate is similar to comparison radiographs. Diffuse discogenic and facet degenerative changes noted throughout  the lumbar spine at both instrumented in non instrumented levels. Additional degenerative changes in the hips and pelvis. The osseous structures appear diffusely demineralized which may limit detection of small or nondisplaced fractures. No acute or worrisome osseous lesions. IMPRESSION: 1. Large volume of inspissated fecal material noted throughout the colon. Mild distal colonic thickening versus underdistention of the sigmoid without evidence of inflammation. Correlate for features of constipation. Furthermore, could consider correlation with most recent colonoscopy or outpatient visualization if not recently performed. 2. Similar thickening versus underdistention of the stomach, could reflect gastritis or secondary change given patient's prolonged emesis. 3. Moderately distended urinary bladder with layering air-fluid level. Could reflect recent instrumentation/catheterization, however if urinary symptoms are present, recommend further evaluation with urinalysis to exclude cystitis. 4. Cholelithiasis without evidence of acute cholecystitis. Mild focal thickening at the gallbladder fundus is suspected to reflect adenomyomatosis but could correlate with outpatient right upper quadrant ultrasound. Could perform on acute basis if biliary symptoms are present. 5. Aortic Atherosclerosis (ICD10-I70.0). Electronically Signed   By: Kreg Shropshire M.D.   On: 03/17/2020 19:37   X-ray chest PA and lateral  Result Date: 03/18/2020 CLINICAL DATA:  Acute renal insufficiency EXAM: CHEST - 2 VIEW COMPARISON:  December 26, 2014 FINDINGS: No pneumothorax. The cardiomediastinal silhouette is normal. No pulmonary nodules or masses. Mild bibasilar opacities, likely atelectasis. No overt edema.  IMPRESSION: Probable mild bibasilar atelectasis. Recommend follow-up to resolution. No other acute abnormalities. Electronically Signed   By: Gerome Sam III M.D   On: 03/18/2020 08:56   CT CHEST WO CONTRAST  Result Date: 03/24/2020 CLINICAL DATA:  Shortness of breath, positive anti GBM, assess for alveolar hemorrhage EXAM: CT CHEST WITHOUT CONTRAST TECHNIQUE: Multidetector CT imaging of the chest was performed following the standard protocol without IV contrast. COMPARISON:  Radiograph 03/19/2020 FINDINGS: Cardiovascular: Normal heart size. No pericardial effusion. Extensive coronary artery calcifications are present. Additional calcifications present on the aortic leaflets. Hypoattenuation the cardiac blood pool relative to the myocardium compatible with anemia. Atherosclerotic plaque within the normal caliber aorta. Normal 3 vessel branching of the aortic arch with extensive calcification of the proximal great vessels. Central pulmonary arteries are normal caliber. Luminal evaluation precluded in the absence of contrast media. A tunneled right IJ central venous catheter tip terminates at the level of the superior cavoatrial junction. No other major venous abnormality. Mediastinum/Nodes: Numerous subcentimeter low-attenuation mediastinal nodes are present favored to be reactive. No concerning axillary adenopathy. Hilar nodal evaluation limited in the absence of intravenous contrast media. No acute abnormality of the trachea or esophagus. No concerning thyroid nodules. Drastic inlet is unremarkable. Lungs/Pleura: Central airways are unremarkable. There are mosaic areas of mixed ground-glass and early developing consolidative opacity throughout both lungs most prominent centrally in the left upper lobe, superior segment left lower lobe and right upper lobe. Some mild septal thickening is noted towards the apices. Biapical pleuroparenchymal scarring is noted as well. There are moderate bilateral pleural  effusions with adjacent areas of passive atelectasis. Underlying airspace disease in the regions of volume loss is difficult to exclude. Upper Abdomen: No acute abnormalities present in the visualized portions of the upper abdomen. Musculoskeletal: The osseous structures appear diffusely demineralized which may limit detection of small or nondisplaced fractures. Superior endplate deformity at L1 is likely related to a large Schmorl's node. No acute or worrisome osseous lesions. Multilevel degenerative changes are present in the imaged portions of the spine. Additional degenerative changes in the shoulders. Scattered fiducials and likely benign macro calcifications noted  in the bilateral breasts. No concerning chest wall lesions. IMPRESSION: 1. Bilateral areas of mosaic ground-glass and consolidative opacity are nonspecific. In the setting of anti GBM antibodies, very early or developing alveolar hemorrhage could present with a similar appearance however the presence of septal thickening may suggest edema and certainly atypical infections including viral pneumonia would present similarly. 2. Moderate bilateral pleural effusions. Adjacent areas of passive atelectasis. Cannot exclude underlying airspace disease. 3.  Atherosclerotic plaque within the normal caliber aorta. 4. Coronary artery calcifications are present. Please note that the presence of coronary artery calcium documents the presence of coronary artery disease, the severity of this disease and any potential stenosis cannot be assessed on this non-gated CT examination. Electronically Signed   By: Lovena Le M.D.   On: 03/24/2020 22:59   NM Pulmonary Perfusion  Result Date: 03/22/2020 CLINICAL DATA:  Elevated D-dimer level, acute renal failure. EXAM: NUCLEAR MEDICINE PERFUSION LUNG SCAN TECHNIQUE: Perfusion images were obtained in multiple projections after intravenous injection of radiopharmaceutical. Ventilation scans intentionally deferred if  perfusion scan and chest x-ray adequate for interpretation during COVID 19 epidemic. RADIOPHARMACEUTICALS:  4.3 mCi Tc-75mMAA IV COMPARISON:  Chest radiograph 03/22/2020 FINDINGS: Small bandlike regions of hypoperfusion in the mid lungs bilaterally, not compelling for wedge-shaped, accordingly not considered of substantial suspicion for acute pulmonary embolus. IMPRESSION: No overtly wedge-shaped perfusion defects. By PISAPED criteria this is typically classified as "pulmonary embolism absent." Electronically Signed   By: WVan ClinesM.D.   On: 03/22/2020 18:33   IR Fluoro Guide CV Line Right  Result Date: 03/20/2020 INDICATION: 74year old female referred for hemodialysis catheter placement EXAM: IMAGE GUIDED PLACEMENT OF TUNNELED HEMODIALYSIS CATHETER MEDICATIONS: 2 g Ancef. The antibiotic was given in an appropriate time interval prior to skin puncture. ANESTHESIA/SEDATION: Moderate (conscious) sedation was employed during this procedure. A total of Versed 0.5 mg and Fentanyl 12.5 mcg was administered intravenously. Moderate Sedation Time: 19 minutes. The patient's level of consciousness and vital signs were monitored continuously by radiology nursing throughout the procedure under my direct supervision. FLUOROSCOPY TIME:  Fluoroscopy Time: 0 minutes 36 seconds (1 mGy). COMPLICATIONS: None PROCEDURE: Informed written consent was obtained from the patient after a discussion of the risks, benefits, and alternatives to treatment. Questions regarding the procedure were encouraged and answered. The right neck and chest were prepped with chlorhexidine in a sterile fashion, and a sterile drape was applied covering the operative field. Maximum barrier sterile technique with sterile gowns and gloves were used for the procedure. A timeout was performed prior to the initiation of the procedure. Ultrasound survey was performed. Micropuncture kit was utilized to access the right internal jugular vein under direct,  real-time ultrasound guidance after the overlying soft tissues were anesthetized with 1% lidocaine with epinephrine. Stab incision was made with 11 blade scalpel. Microwire was passed centrally. The microwire was then marked to measure appropriate internal catheter length. External tunneled length was estimated. A total tip to cuff length of 19 cm was selected. 035 guidewire was advanced to the level of the IVC. Skin and subcutaneous tissues of chest wall below the clavicle were generously infiltrated with 1% lidocaine for local anesthesia. A small stab incision was made with 11 blade scalpel. The selected hemodialysis catheter was tunneled in a retrograde fashion from the anterior chest wall to the venotomy incision. Serial dilation was performed and then a peel-away sheath was placed. The catheter was then placed through the peel-away sheath with tips ultimately positioned within the superior aspect of  the right atrium. Final catheter positioning was confirmed and documented with a spot radiographic image. The catheter aspirates and flushes normally. The catheter was flushed with appropriate volume heparin dwells. The catheter exit site was secured with a 0-Prolene retention suture. Gel-Foam slurry was infused into the soft tissue tract. The venotomy incision was closed Derma bond and sterile dressing. Dressings were applied at the chest wall. Patient tolerated the procedure well and remained hemodynamically stable throughout. No complications were encountered and no significant blood loss encountered. IMPRESSION: Status post right IJ tunneled hemodialysis catheter placement. Catheter ready for use. Signed, Dulcy Fanny. Dellia Nims, RPVI [facility] Radiology Electronically Signed   By: Corrie Mckusick D.O.   On: 03/20/2020 15:27   IR US Guide Vasc Access Right  Result Date: 03/20/2020 INDICATION: 74 year old female referred for hemodialysis catheter placement EXAM:  IMAGE GUIDED PLACEMENT OF TUNNELED HEMODIALYSIS CATHETER MEDICATIONS: 2 g Ancef. The antibiotic was given in an appropriate time interval prior to skin puncture. ANESTHESIA/SEDATION: Moderate (conscious) sedation was employed during this procedure. A total of Versed 0.5 mg and Fentanyl 12.5 mcg was administered intravenously. Moderate Sedation Time: 19 minutes. The patient's level of consciousness and vital signs were monitored continuously by radiology nursing throughout the procedure under my direct supervision. FLUOROSCOPY TIME:  Fluoroscopy Time: 0 minutes 36 seconds (1 mGy). COMPLICATIONS: None PROCEDURE: Informed written consent was obtained from the patient after a discussion of the risks, benefits, and alternatives to treatment. Questions regarding the procedure were encouraged and answered. The right neck and chest were prepped with chlorhexidine in a sterile fashion, and a sterile drape was applied covering the operative field. Maximum barrier sterile technique with sterile gowns and gloves were used for the procedure. A timeout was performed prior to the initiation of the procedure. Ultrasound survey was performed. Micropuncture kit was utilized to access the right internal jugular vein under direct, real-time ultrasound guidance after the overlying soft tissues were anesthetized with 1% lidocaine with epinephrine. Stab incision was made with 11 blade scalpel. Microwire was passed centrally. The microwire was then marked to measure appropriate internal catheter length. External tunneled length was estimated. A total tip to cuff length of 19 cm was selected. 035 guidewire was advanced to the level of the IVC. Skin and subcutaneous tissues of chest wall below the clavicle were generously infiltrated with 1% lidocaine for local anesthesia. A small stab incision was made with 11 blade scalpel. The selected hemodialysis catheter was tunneled in a retrograde fashion from the anterior chest wall to the venotomy  incision. Serial dilation was performed and then a peel-away sheath was placed. The catheter was then placed through the peel-away sheath with tips ultimately positioned within the superior aspect of the right atrium. Final catheter positioning was confirmed and documented with a spot radiographic image. The catheter aspirates and flushes normally. The catheter was flushed with appropriate volume heparin dwells. The catheter exit site was secured with a 0-Prolene retention suture. Gel-Foam slurry was infused into the soft tissue tract. The venotomy incision was closed Derma bond and sterile dressing. Dressings were applied at the chest wall. Patient tolerated the procedure well and remained hemodynamically stable throughout. No complications were encountered and no significant blood loss encountered. IMPRESSION: Status post right IJ tunneled hemodialysis catheter placement. Catheter ready for use. Signed, Dulcy Fanny. Dellia Nims, RPVI [facility] Radiology Electronically Signed   By: Corrie Mckusick D.O.   On: 03/20/2020 15:27   DG Chest  Port 1 View  Result Date: 03/22/2020 CLINICAL DATA:  Elevated D-dimer level. EXAM: PORTABLE CHEST 1 VIEW COMPARISON:  Chest radiograph 03/19/2020 FINDINGS: New left perihilar bilateral infrahilar patchy airspace opacities. Blunting of the costophrenic angles bilaterally. Dialysis catheter tip: Lower SVC. No visible pneumothorax. Mild thoracic spondylosis. IMPRESSION: 1. New left perihilar and infrahilar patchy airspace opacities. This could represent bilateral pneumonia or asymmetric noncardiogenic edema. 2. Small bilateral pleural effusions. 3. New right IJ dialysis catheter tip: SVC.  No pneumothorax. Electronically Signed   By: Van Clines M.D.   On: 03/22/2020 18:34   DG Chest Port 1 View  Result Date: 03/19/2020 CLINICAL DATA:  Shortness of breath. EXAM: PORTABLE CHEST 1 VIEW COMPARISON:  03/18/2020 FINDINGS: The cardiac  silhouette, mediastinal and hilar contours are within normal limits and stable. Stable underlying emphysematous changes and pulmonary scarring. Suspect streaky bibasilar infiltrates. No pleural effusions. No worrisome pulmonary lesions. IMPRESSION: 1. Underlying emphysematous changes and pulmonary scarring. 2. Suspect streaky bibasilar infiltrates. Electronically Signed   By: Marijo Sanes M.D.   On: 03/19/2020 07:59   US BIOPSY (KIDNEY)  Result Date: 03/22/2020 INDICATION: 52 year old with acute kidney injury.  Request for renal biopsy. EXAM: ULTRASOUND-GUIDED RANDOM LEFT RENAL BIOPSY MEDICATIONS: None. ANESTHESIA/SEDATION: Moderate (conscious) sedation was employed during this procedure. A total of Versed 1.0 mg and Fentanyl 50 mcg was administered intravenously. Moderate Sedation Time: 14 minutes. The patient's level of consciousness and vital signs were monitored continuously by radiology nursing throughout the procedure under my direct supervision. FLUOROSCOPY TIME:  None COMPLICATIONS: None immediate. PROCEDURE: Informed written consent was obtained from the patient after a thorough discussion of the procedural risks, benefits and alternatives. All questions were addressed. A timeout was performed prior to the initiation of the procedure. Patient was placed prone. Both kidneys were evaluated with ultrasound. Left kidney was selected for biopsy. Left flank was prepped with chlorhexidine and sterile field was created. Skin and soft tissues were anesthetized with 1% lidocaine. Small skin incision was made. Using ultrasound guidance, 16 gauge core biopsy needle was directed into the left kidney lower pole. Core biopsy was obtained and placed in saline. A second core biopsy was obtained using ultrasound guidance from the left kidney lower pole. Second core biopsy was adequate and placed in saline. Bandage placed over the puncture site. FINDINGS: Perinephric edema involving both kidneys prior to the procedure. No  significant bleeding or hematoma formation from the left kidney following the core biopsies. Two adequate core biopsies were obtained. IMPRESSION: Ultrasound-guided core biopsy of left kidney lower pole. Electronically Signed   By: Markus Daft M.D.   On: 03/22/2020 10:31   VAS [place] LOWER EXTREMITY VENOUS (DVT)  Result Date: 03/22/2020  Lower Venous DVTStudy Indications: Elevated Ddimer.  Risk Factors: None identified. Comparison Study: No prior studies. Performing Technologist: Oliver Hum RVT  Examination Guidelines: A complete evaluation includes B-mode imaging, spectral Doppler, color Doppler, and power Doppler as needed of all accessible portions of each vessel. Bilateral testing is considered an integral part of a complete examination. Limited examinations for reoccurring indications may be performed as noted. The reflux portion of the exam is performed with the patient in reverse Trendelenburg.  +---------+---------------+---------+-----------+----------+--------------+ RIGHT    CompressibilityPhasicitySpontaneityPropertiesThrombus Aging +---------+---------------+---------+-----------+----------+--------------+ CFV      Full           Yes      Yes                                 +---------+---------------+---------+-----------+----------+--------------+  SFJ      Full                                                        +---------+---------------+---------+-----------+----------+--------------+ FV Prox  Full                                                        +---------+---------------+---------+-----------+----------+--------------+ FV Mid   Full                                                        +---------+---------------+---------+-----------+----------+--------------+ FV DistalFull                                                        +---------+---------------+---------+-----------+----------+--------------+ PFV      Full                                                         +---------+---------------+---------+-----------+----------+--------------+ POP      Full           Yes      Yes                                 +---------+---------------+---------+-----------+----------+--------------+ PTV      Full                                                        +---------+---------------+---------+-----------+----------+--------------+ PERO     Full                                                        +---------+---------------+---------+-----------+----------+--------------+   +---------+---------------+---------+-----------+----------+--------------+ LEFT     CompressibilityPhasicitySpontaneityPropertiesThrombus Aging +---------+---------------+---------+-----------+----------+--------------+ CFV      Full           Yes      Yes                                 +---------+---------------+---------+-----------+----------+--------------+ SFJ      Full                                                        +---------+---------------+---------+-----------+----------+--------------+  FV Prox  Full                                                        +---------+---------------+---------+-----------+----------+--------------+ FV Mid   Full                                                        +---------+---------------+---------+-----------+----------+--------------+ FV DistalFull                                                        +---------+---------------+---------+-----------+----------+--------------+ PFV      Full                                                        +---------+---------------+---------+-----------+----------+--------------+ POP      Full           Yes      Yes                                 +---------+---------------+---------+-----------+----------+--------------+ PTV      Full                                                         +---------+---------------+---------+-----------+----------+--------------+ PERO     Full                                                        +---------+---------------+---------+-----------+----------+--------------+     Summary: RIGHT: - There is no evidence of deep vein thrombosis in the lower extremity.  - No cystic structure found in the popliteal fossa.  LEFT: - There is no evidence of deep vein thrombosis in the lower extremity.  - No cystic structure found in the popliteal fossa.  *See table(s) above for measurements and observations. Electronically signed by Servando Snare MD on 03/22/2020 at 4:37:23 PM.    Final    US Abdomen Limited RUQ  Result Date: 03/18/2020 CLINICAL DATA:  Nausea and vomiting EXAM: ULTRASOUND ABDOMEN LIMITED RIGHT UPPER QUADRANT COMPARISON:  CT abdomen and pelvis 03/17/2020 FINDINGS: Gallbladder: Large shadowing calculus within gallbladder 19 mm diameter. No gallbladder wall thickening, pericholecystic fluid or sonographic Murphy sign. Common bile duct: Diameter: 3 mm, normal Liver: Normal appearance. No mass or nodularity. Portal vein is patent on color Doppler imaging with normal direction of blood flow towards the liver. Other: Trace perihepatic free fluid. IMPRESSION: Cholelithiasis without evidence of cholecystitis. Trace ascites adjacent to liver.  Electronically Signed   By: Lavonia Dana M.D.   On: 03/18/2020 13:32     Phillips Climes M.D on 03/27/2020 at 3:50 PM  To page go to [email or web address] - password [email or web address]

## 2020-03-27 NOTE — Progress Notes (Signed)
Patient to dialysis .   DST. RN

## 2020-03-27 NOTE — Procedures (Signed)
Pt seen and examined while on plasmapheresis.  Low grade temp and given tylenol.  Feels great.  Tolerating procedure well bp 155/65, HR 84

## 2020-03-28 DIAGNOSIS — E039 Hypothyroidism, unspecified: Secondary | ICD-10-CM

## 2020-03-28 LAB — THERAPEUTIC PLASMA EXCHANGE (BLOOD BANK)
Plasma Exchange: 3000
Plasma volume needed: 3000
Unit division: 0
Unit division: 0
Unit division: 0
Unit division: 0
Unit division: 0
Unit division: 0
Unit division: 0
Unit division: 0
Unit division: 0

## 2020-03-28 LAB — RENAL FUNCTION PANEL
Albumin: 2.9 g/dL — ABNORMAL LOW (ref 3.5–5.0)
Anion gap: 16 — ABNORMAL HIGH (ref 5–15)
BUN: 49 mg/dL — ABNORMAL HIGH (ref 8–23)
CO2: 28 mmol/L (ref 22–32)
Calcium: 8.6 mg/dL — ABNORMAL LOW (ref 8.9–10.3)
Chloride: 92 mmol/L — ABNORMAL LOW (ref 98–111)
Creatinine, Ser: 5 mg/dL — ABNORMAL HIGH (ref 0.44–1.00)
GFR calc Af Amer: 9 mL/min — ABNORMAL LOW (ref >60)
GFR calc non Af Amer: 8 mL/min — ABNORMAL LOW (ref >60)
Glucose, Bld: 205 mg/dL — ABNORMAL HIGH (ref 70–99)
Phosphorus: 3.5 mg/dL (ref 2.5–4.6)
Potassium: 3.2 mmol/L — ABNORMAL LOW (ref 3.5–5.1)
Sodium: 136 mmol/L (ref 135–145)

## 2020-03-28 LAB — CBC
HCT: 25.7 % — ABNORMAL LOW (ref 36.0–46.0)
Hemoglobin: 8.1 g/dL — ABNORMAL LOW (ref 12.0–15.0)
MCH: 28 pg (ref 26.0–34.0)
MCHC: 31.5 g/dL (ref 30.0–36.0)
MCV: 88.9 fL (ref 80.0–100.0)
Platelets: 145 10*3/uL — ABNORMAL LOW (ref 150–400)
RBC: 2.89 MIL/uL — ABNORMAL LOW (ref 3.87–5.11)
RDW: 14.8 % (ref 11.5–15.5)
WBC: 18 10*3/uL — ABNORMAL HIGH (ref 4.0–10.5)
nRBC: 0 % (ref 0.0–0.2)

## 2020-03-28 MED ORDER — DIPHENHYDRAMINE HCL 25 MG PO CAPS
25.0000 mg | ORAL_CAPSULE | Freq: Four times a day (QID) | ORAL | Status: DC | PRN
  Administered 2020-03-28: 25 mg via ORAL

## 2020-03-28 MED ORDER — ANTICOAGULANT SODIUM CITRATE 4% (200MG/5ML) IV SOLN
5.0000 mL | Freq: Once | Status: AC
  Administered 2020-03-28: 5 mL
  Filled 2020-03-28: qty 5

## 2020-03-28 MED ORDER — DIPHENHYDRAMINE HCL 25 MG PO CAPS
ORAL_CAPSULE | ORAL | Status: AC
  Filled 2020-03-28: qty 1

## 2020-03-28 MED ORDER — CALCIUM CARBONATE ANTACID 500 MG PO CHEW
CHEWABLE_TABLET | ORAL | Status: AC
  Filled 2020-03-28: qty 2

## 2020-03-28 MED ORDER — ACETAMINOPHEN 325 MG PO TABS
650.0000 mg | ORAL_TABLET | ORAL | Status: DC | PRN
  Administered 2020-03-28: 650 mg via ORAL

## 2020-03-28 MED ORDER — HEPARIN SODIUM (PORCINE) 1000 UNIT/ML DIALYSIS: 20.0000 [IU]/kg | INTRAMUSCULAR | Status: DC | PRN

## 2020-03-28 MED ORDER — ACD FORMULA A 0.73-2.45-2.2 GM/100ML VI SOLN
Status: AC
  Filled 2020-03-28: qty 500

## 2020-03-28 MED ORDER — CALCIUM CARBONATE ANTACID 500 MG PO CHEW
2.0000 | CHEWABLE_TABLET | ORAL | Status: DC
  Administered 2020-03-28: 400 mg via ORAL

## 2020-03-28 MED ORDER — ACETAMINOPHEN 325 MG PO TABS
ORAL_TABLET | ORAL | Status: AC
  Filled 2020-03-28: qty 2

## 2020-03-28 MED ORDER — SODIUM CHLORIDE 0.9 % IV SOLN
3.0000 g | Freq: Once | INTRAVENOUS | Status: AC
  Administered 2020-03-28: 3 g via INTRAVENOUS
  Filled 2020-03-28: qty 30

## 2020-03-28 MED ORDER — ACD FORMULA A 0.73-2.45-2.2 GM/100ML VI SOLN: 1000.0000 mL | Status: DC

## 2020-03-28 NOTE — Progress Notes (Signed)
PROGRESS NOTE        PATIENT DETAILS Name: Jasmine White Age: 74 y.o. Sex: female Date of Birth: 06-28-1946 Admit Date: 03/17/2020 Admitting Physician Clance Boll, MD CNO:[id number], [name], FNP  Brief Narrative: Patient is a 74 y.o. female with history of HTN, hypothyroidism, HLD, recently diagnosed by PCP with renal dysfunction, history of COVID-19 infection in November 2020-since then been having persistent fatigue-who presented with persistent nausea/vomiting-further evaluation revealed severe hyponatremia with a sodium of 109, worsening renal function with a creatinine of 7.12- she was admitted-provided supportive care-subsequently nephrology was consulted-she underwent renal biopsy-which showed 100% crescents and significant amount of ATN.  She was subsequently started on plasmapheresis, Solu-Medrol/Cytoxan and also on hemodialysis.  See below for further details.  Significant events: None  Significant studies: 6/26>> CT abdomen pelvis: Large volume of inspissated fecal material throughout the colon, moderately distended urinary bladder, cholelithiasis 6/27>> RUQ ultrasound: Cholelithiasis without evidence of cholecystitis 7/1>> VQ scan: Pulmonary embolism absent 7/1>> bilateral lower extremity Doppler: No DVT 7/3>> CT chest: Bilateral areas of mosaic groundglass and consolidative opacity-are nonspecific-in the setting of anti-GBM antibodies very early/developing alveolar hemorrhage could present with similar appearance.  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  Remains on steroids, Cytoxan and Bactrim MWF for PCP prophylaxis.  Continue to follow closely for renal recovery.  ?  Alveolar hemorrhage: No hemoptysis-not hypoxic-started on plasmapheresis x2 weeks.  CT chest results noted above.  Hyponatremia: Secondary to hypovolemia/HCTZ use-and AKI-resolved with supportive care.  Normocytic anemia: Probably related to CKD-has required 1 unit of PRBC so far.  SPEP-without M spike  Constipation: Resolved.  Hypothyroidism: Continue Synthroid-TSH stable.  Deconditioning: Rehab services following   Diet: Diet Order            Diet renal with fluid restriction Fluid restriction: 1200 mL Fluid; Room service appropriate? Yes; Fluid consistency: Thin  Diet effective now                  Code Status: Full code   Family Communication: None at bedside  Disposition Plan: Status is: Inpatient  Remains inpatient appropriate because:Inpatient level of care appropriate due to severity of illness   Dispo: The patient is from: Home              Anticipated d/c is to: Home              Anticipated d/c date is: > 3 days              Patient currently is not medically stable to d/c.  Barriers to Discharge: Requires daily plasmapheresis x2 weeks total-before discharge with outpatient HD.  Antimicrobial agents: Anti-infectives (From admission, onward)   Start     Dose/Rate Route Frequency Ordered Stop   03/26/20 1000  sulfamethoxazole-trimethoprim (BACTRIM) 400-80 MG per tablet 1 tablet  Discontinue     1 tablet Oral Every M-W-F 03/25/20 0954     03/25/20 2200  sulfamethoxazole-trimethoprim (BACTRIM) 400-80 MG per tablet 1 tablet  Status:  Discontinued        1 tablet Oral  Daily at bedtime 03/25/20 0854 03/25/20 0921   03/20/20 1319  ceFAZolin (ANCEF) 2-4 GM/100ML-% IVPB       Note to Pharmacy: Arlean Hopping   : cabinet override      03/20/20 1319 03/21/20 0129   03/20/20 1100  ceFAZolin (ANCEF) IVPB 2g/100 mL premix        2 g 200 mL/hr over 30 Minutes Intravenous On call 03/20/20 1040 03/20/20 1506   03/19/20 0000  cefTRIAXone (ROCEPHIN) 1 g in sodium chloride 0.9 % 100 mL IVPB       Note to Pharmacy: 1st dose now   1 g 200 mL/hr over 30 Minutes Intravenous Every 24 hours 03/18/20 0127 03/21/20 0008   03/18/20 0145  cefTRIAXone (ROCEPHIN) 1 g in sodium chloride 0.9 % 100 mL IVPB       Note to Pharmacy: 1st dose now   1 g 200 mL/hr over 30 Minutes Intravenous  Once 03/18/20 0135 03/18/20 0252       Time spent: 25- minutes-Greater than 50% of this time was spent in counseling, explanation of diagnosis, planning of further management, and coordination of care.  MEDICATIONS: Scheduled Meds: . sodium chloride   Intravenous Once  . amLODipine  10 mg Oral Daily  . Chlorhexidine Gluconate Cloth  6 each Topical Q0600  . cholecalciferol  1,000 Units Oral Daily  . cyclophosphamide  50 mg Oral q1800  . [START ON 03/29/2020] darbepoetin (ARANESP) injection - NON-DIALYSIS  40 mcg Subcutaneous Q Thu-1800  . docusate sodium  200 mg Oral BID  . heparin injection (subcutaneous)  5,000 Units Subcutaneous Q8H  . hydrALAZINE  100 mg Oral TID  . levothyroxine  88 mcg Oral Q0600  . metoprolol tartrate  100 mg Oral BID  . pantoprazole  40 mg Oral Daily  . polyethylene glycol  17 g Oral BID  . predniSONE  60 mg Oral Q breakfast  . sulfamethoxazole-trimethoprim  1 tablet Oral Q M,W,F   Continuous Infusions: PRN Meds:.acetaminophen, albuterol, [DISCONTINUED] ondansetron **OR** ondansetron (ZOFRAN) IV, technetium albumin aggregated   PHYSICAL EXAM: Vital signs: Vitals:   03/28/20 1440 03/28/20 1446 03/28/20 1505 03/28/20 1526  BP: 129/63 (!) 132/56 140/69 135/61   Pulse: 89 86 96 96  Resp: 15 16 19 19   Temp: 97.9 F (36.6 C) 97.9 F (36.6 C) 98.6 F (37 C) 98.9 F (37.2 C)  TempSrc: Oral Oral Oral Oral  SpO2:   99% 95%  Weight:      Height:       Filed Weights   03/27/20 0400 03/28/20 0731 03/28/20 1113  Weight: 62 kg 62.3 kg 61.5 kg   Body mass index is 21.88 kg/m.   Gen Exam:Alert awake-not in any distress HEENT:atraumatic, normocephalic Chest: B/L clear to auscultation anteriorly CVS:S1S2 regular Abdomen:soft non tender, non distended Extremities:no edema Neurology: Non focal Skin: no rash  I have personally reviewed following labs and imaging studies  LABORATORY DATA: CBC: Recent Labs  Lab 03/22/20 0103 03/23/20 0815 03/24/20 1725 03/25/20 0159 03/26/20 0605 03/27/20 1235 03/28/20 0757  WBC 14.0*   < > 18.8* 15.9* 18.8* 18.0* 18.0*  NEUTROABS 13.2*  --   --   --   --   --   --   HGB 8.0*   < >  10.5* 9.2* 8.9* 8.8* 8.1*  HCT 22.9*   < > 31.8* 27.7* 27.7* 27.7* 25.7*  MCV 78.2*   < > 84.8 83.9 86.0 86.6 88.9  PLT 281   < > 198 165 161 138* 145*   < > = values in this interval not displayed.    Basic Metabolic Panel: Recent Labs  Lab 03/22/20 0103 03/23/20 0228 03/24/20 0226 03/24/20 0226 03/25/20 0159 03/26/20 0605 03/26/20 1516 03/27/20 1125 03/28/20 0757  NA 135   < > 137   < > 135 136 136 137 136  K 3.4*   < > 3.6   < > 3.7 3.4* 4.0 3.0* 3.2*  CL 95*   < > 96*   < > 95* 95* 98 93* 92*  CO2 28   < > 30   < > 29 26 28 28 28   GLUCOSE 190*   < > 113*   < > 112* 95 109* 146* 205*  BUN 41*   < > 50*   < > 29* 54* 14 33* 49*  CREATININE 4.54*   < > 4.68*   < > 2.91* 5.01* 2.10* 3.73* 5.00*  CALCIUM 8.8*   < > 8.9   < > 8.7* 8.5* 8.0* 8.2* 8.6*  MG 2.2  --   --   --   --   --   --   --   --   PHOS 5.3*   < > 3.8  --  2.7 3.1  --  2.7 3.5   < > = values in this interval not displayed.    GFR: Estimated Creatinine Clearance: 9.2 mL/min (A) (by C-G formula based on SCr of 5 mg/dL (H)).  Liver Function  Tests: Recent Labs  Lab 03/22/20 0103 03/23/20 0228 03/24/20 0226 03/25/20 0159 03/26/20 0605 03/27/20 1125 03/28/20 0757  AST 23  --   --   --   --   --   --   ALT 14  --   --   --   --   --   --   ALKPHOS 52  --   --   --   --   --   --   BILITOT 0.7  --   --   --   --   --   --   PROT 6.3*  --   --   --   --   --   --   ALBUMIN 3.0*   < > 3.0* 3.0* 2.8* 2.8* 2.9*   < > = values in this interval not displayed.   No results for input(s): LIPASE, AMYLASE in the last 168 hours. No results for input(s): AMMONIA in the last 168 hours.  Coagulation Profile: Recent Labs  Lab 03/22/20 0103  INR 1.2    Cardiac Enzymes: No results for input(s): CKTOTAL, CKMB, CKMBINDEX, TROPONINI in the last 168 hours.  BNP (last 3 results) No results for input(s): PROBNP in the last 8760 hours.  Lipid Profile: No results for input(s): CHOL, HDL, LDLCALC, TRIG, CHOLHDL, LDLDIRECT in the last 72 hours.  Thyroid Function Tests: No results for input(s): TSH, T4TOTAL, FREET4, T3FREE, THYROIDAB in the last 72 hours.  Anemia Panel: No results for input(s): VITAMINB12, FOLATE, FERRITIN, TIBC, IRON, RETICCTPCT in the last 72 hours.  Urine analysis:    Component Value Date/Time   COLORURINE AMBER (A) 03/18/2020 2143   APPEARANCEUR HAZY (A) 03/18/2020 2143   APPEARANCEUR Clear 03/03/2019 0920   LABSPEC 1.008 03/18/2020 2143  PHURINE 6.0 03/18/2020 2143   GLUCOSEU NEGATIVE 03/18/2020 2143   HGBUR LARGE (A) 03/18/2020 2143   BILIRUBINUR NEGATIVE 03/18/2020 2143   BILIRUBINUR Negative 03/03/2019 0920   KETONESUR NEGATIVE 03/18/2020 2143   PROTEINUR 100 (A) 03/18/2020 2143   UROBILINOGEN negative 12/21/2012 1509   NITRITE NEGATIVE 03/18/2020 2143   LEUKOCYTESUR SMALL (A) 03/18/2020 2143    Sepsis Labs: Lactic Acid, Venous No results found for: LATICACIDVEN  MICROBIOLOGY: Recent Results (from the past 240 hour(s))  Culture, Urine     Status: None   Collection Time: 03/19/20 10:43 AM    Specimen: Urine, Random  Result Value Ref Range Status   Specimen Description URINE, RANDOM  Final   Special Requests NONE  Final   Culture   Final    NO GROWTH Performed at [facility], [place] [address] [place], [place] [address]    Report Status 03/20/2020 FINAL  Final    RADIOLOGY STUDIES/RESULTS: No results found.   LOS: 11 days   Oren Binet, MD  [facility]    To contact the attending provider between 7A-7P or the covering provider during after hours 7P-7A, please log into the web site [email or web address] and access using universal [place] password for that web site. If you do not have the password, please call the hospital operator.  03/28/2020, 4:17 PM

## 2020-03-28 NOTE — Progress Notes (Signed)
Pt states she is not going to dialysis till she gets her breakfast tray. Will continue to monitor pt. Ranelle Oyster, RN

## 2020-03-28 NOTE — Progress Notes (Addendum)
Patient has been accepted to [facility] on a TTS schedule with a seat time of 11:30am. Navigator has notified Dr. Coladonato/Nephrologist and will follow up with patient/family.  Update: Renal Navigator received call from clinic on 03/29/20 that they had given the wrong seat time. The correct seat time for patient is MWF at 11:45am. Nephrologist updated.  Alphonzo Cruise, [place] Renal Navigator [phone]

## 2020-03-28 NOTE — Progress Notes (Signed)
Patient ID: Jasmine White, female   DOB: [birth year], 74 y.o.   MRN: [id number] S:"I feel great" and was able to walk to the bathroom unassisted. O:BP 130/65 (BP Location: Left Arm)   Pulse 82   Temp (!) 97.2 F (36.2 C) (Oral)   Resp (!) 26   Ht 5\' 6"  (1.676 m)   Wt 62.3 kg   SpO2 99%   BMI 22.17 kg/m   Intake/Output Summary (Last 24 hours) at 03/28/2020 0821 Last data filed at 03/28/2020 0558 Gross per 24 hour  Intake 660 ml  Output 200 ml  Net 460 ml   Intake/Output: I/O last 3 completed shifts: In: 840 [P.O.:840] Out: 202 [Urine:200; Stool:2]  Intake/Output this shift:  No intake/output data recorded. Weight change:  Gen: NAD CVS: no rub Resp: CTA Abd: benign Ext: no edema  Recent Labs  Lab 03/22/20 0103 03/22/20 0103 03/23/20 0228 03/23/20 0228 03/23/20 0815 03/23/20 1247 03/24/20 0226 03/25/20 0159 03/26/20 0605 03/26/20 1516 03/27/20 1125  NA 135   < > 135   < > 135 135 137 135 136 136 137  K 3.4*   < > 4.6   < > 3.9 3.9 3.6 3.7 3.4* 4.0 3.0*  CL 95*   < > 97*   < > 96* 95* 96* 95* 95* 98 93*  CO2 28   < > 28  --  25  --  30 29 26 28 28   GLUCOSE 190*   < > 147*   < > 158* 148* 113* 112* 95 109* 146*  BUN 41*   < > 30*   < > 35* 37* 50* 29* 54* 14 33*  CREATININE 4.54*   < > 3.32*   < > 3.85* 4.00* 4.68* 2.91* 5.01* 2.10* 3.73*  ALBUMIN 3.0*  --  3.1*  --   --   --  3.0* 3.0* 2.8*  --  2.8*  CALCIUM 8.8*   < > 8.7*  --  8.7*  --  8.9 8.7* 8.5* 8.0* 8.2*  PHOS 5.3*  --  3.7  --   --   --  3.8 2.7 3.1  --  2.7  AST 23  --   --   --   --   --   --   --   --   --   --   ALT 14  --   --   --   --   --   --   --   --   --   --    < > = values in this interval not displayed.   Liver Function Tests: Recent Labs  Lab 03/22/20 0103 03/23/20 0228 03/25/20 0159 03/26/20 0605 03/27/20 1125  AST 23  --   --   --   --   ALT 14  --   --   --   --   ALKPHOS 52  --   --   --   --   BILITOT 0.7  --   --   --   --   PROT 6.3*  --   --   --   --   ALBUMIN 3.0*   <  > 3.0* 2.8* 2.8*   < > = values in this interval not displayed.   No results for input(s): LIPASE, AMYLASE in the last 168 hours. No results for input(s): AMMONIA in the last 168 hours. CBC: Recent Labs  Lab 03/22/20 0103 03/23/20 0815 03/24/20 4166 03/24/20 0630  03/24/20 1725 03/24/20 1725 03/25/20 0159 03/26/20 0605 03/27/20 1235  WBC 14.0*   < > 16.1*   < > 18.8*   < > 15.9* 18.8* 18.0*  NEUTROABS 13.2*  --   --   --   --   --   --   --   --   HGB 8.0*   < > 7.0*   < > 10.5*   < > 9.2* 8.9* 8.8*  HCT 22.9*   < > 21.4*   < > 31.8*   < > 27.7* 27.7* 27.7*  MCV 78.2*   < > 83.6  --  84.8  --  83.9 86.0 86.6  PLT 281   < > 184   < > 198   < > 165 161 138*   < > = values in this interval not displayed.   Cardiac Enzymes: No results for input(s): CKTOTAL, CKMB, CKMBINDEX, TROPONINI in the last 168 hours. CBG: No results for input(s): GLUCAP in the last 168 hours.  Iron Studies: No results for input(s): IRON, TIBC, TRANSFERRIN, FERRITIN in the last 72 hours. Studies/Results: No results found. . sodium chloride   Intravenous Once  . amLODipine  10 mg Oral Daily  . Chlorhexidine Gluconate Cloth  6 each Topical Q0600  . cholecalciferol  1,000 Units Oral Daily  . cyclophosphamide  50 mg Oral q1800  . [START ON 03/29/2020] darbepoetin (ARANESP) injection - NON-DIALYSIS  40 mcg Subcutaneous Q Thu-1800  . docusate sodium  200 mg Oral BID  . heparin injection (subcutaneous)  5,000 Units Subcutaneous Q8H  . hydrALAZINE  100 mg Oral TID  . levothyroxine  88 mcg Oral Q0600  . metoprolol tartrate  100 mg Oral BID  . pantoprazole  40 mg Oral Daily  . polyethylene glycol  17 g Oral BID  . predniSONE  60 mg Oral Q breakfast  . sulfamethoxazole-trimethoprim  1 tablet Oral Q M,W,F    BMET    Component Value Date/Time   NA 137 03/27/2020 1125   NA 128 (L) 03/01/2020 0927   K 3.0 (L) 03/27/2020 1125   CL 93 (L) 03/27/2020 1125   CO2 28 03/27/2020 1125   GLUCOSE 146 (H) 03/27/2020  1125   BUN 33 (H) 03/27/2020 1125   BUN 67 (H) 03/01/2020 0927   CREATININE 3.73 (H) 03/27/2020 1125   CREATININE 0.84 12/21/2012 1552   CALCIUM 8.2 (L) 03/27/2020 1125   GFRNONAA 11 (L) 03/27/2020 1125   GFRNONAA 72 12/21/2012 1552   GFRAA 13 (L) 03/27/2020 1125   GFRAA 83 12/21/2012 1552   CBC    Component Value Date/Time   WBC 18.0 (H) 03/27/2020 1235   RBC 3.20 (L) 03/27/2020 1235   HGB 8.8 (L) 03/27/2020 1235   HGB 9.7 (L) 03/01/2020 0927   HCT 27.7 (L) 03/27/2020 1235   HCT 28.8 (L) 03/01/2020 0927   PLT 138 (L) 03/27/2020 1235   PLT 397 03/01/2020 0927   MCV 86.6 03/27/2020 1235   MCV 79 03/01/2020 0927   MCH 27.5 03/27/2020 1235   MCHC 31.8 03/27/2020 1235   RDW 14.6 03/27/2020 1235   RDW 14.7 03/01/2020 0927   LYMPHSABS 0.3 (L) 03/22/2020 0103   LYMPHSABS 1.3 03/01/2020 0927   MONOABS 0.3 03/22/2020 0103   EOSABS 0.0 03/22/2020 0103   EOSABS 0.2 03/01/2020 0927   BASOSABS 0.0 03/22/2020 0103   BASOSABS 0.1 03/01/2020 0927    Assessment/Plan:  1. 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 Started on daily plasmapheresis and IV solumedrol/po cytoxan. HD x2. 1. Biopsy with 100% crescents and significant amount of ATN. 2. Plan to continue with HD on MWF schedule for now. 3. For pheresis with FFP due to possible alveolar hemorrhage daily. 4. Continue cytoxan 50 mg daily  5. Today for 6th out of 14 sessions of pheresis 6. Bactrim MWF for PCP prophylaxis 7. Continue to follow for renal recovery 2. Hyponatremia- due to hypovolemia and concomitant HCTZ use and AKI. Now resolved. 3. Tremors- likely due to IV solumedrol 4. HTN- follow with HD and UF 5. Vascular access- RIJ TDC placed 03/20/20 by IR  [name], MD [facility] [phone]

## 2020-03-28 NOTE — Progress Notes (Signed)
PT Cancellation Note  Patient Details Name: Jasmine White MRN: [id number] DOB: 1946/04/07   Cancelled Treatment:    Reason Eval/Treat Not Completed: Patient at procedure or test/unavailable.  Pt continues to be out of room at HD.  PT will attempt back later today or tomorrow as time allows.   Thanks,  Verdene Lennert, PT, DPT  Acute Rehabilitation [phone] pager #[phone] office       Jasmine White 03/28/2020, 5:01 PM

## 2020-03-28 NOTE — Progress Notes (Signed)
Inpatient Rehab Admissions Coordinator:   Pt supervision to mod I with OT.  Note plans for [facility]. Will sign off for CIR at this time.   Shann Medal, PT, DPT Admissions Coordinator [phone] 03/28/20  10:30 AM

## 2020-03-29 DIAGNOSIS — N19 Unspecified kidney failure: Secondary | ICD-10-CM

## 2020-03-29 DIAGNOSIS — R1110 Vomiting, unspecified: Secondary | ICD-10-CM

## 2020-03-29 LAB — THERAPEUTIC PLASMA EXCHANGE (BLOOD BANK)
Plasma Exchange: 3024
Plasma volume needed: 3000
Unit division: 0
Unit division: 0
Unit division: 0
Unit division: 0
Unit division: 0
Unit division: 0
Unit division: 0

## 2020-03-29 LAB — RENAL FUNCTION PANEL
Albumin: 2.8 g/dL — ABNORMAL LOW (ref 3.5–5.0)
Anion gap: 12 (ref 5–15)
BUN: 27 mg/dL — ABNORMAL HIGH (ref 8–23)
CO2: 28 mmol/L (ref 22–32)
Calcium: 8.4 mg/dL — ABNORMAL LOW (ref 8.9–10.3)
Chloride: 95 mmol/L — ABNORMAL LOW (ref 98–111)
Creatinine, Ser: 3.61 mg/dL — ABNORMAL HIGH (ref 0.44–1.00)
GFR calc Af Amer: 14 mL/min — ABNORMAL LOW (ref >60)
GFR calc non Af Amer: 12 mL/min — ABNORMAL LOW (ref >60)
Glucose, Bld: 137 mg/dL — ABNORMAL HIGH (ref 70–99)
Phosphorus: 2.5 mg/dL (ref 2.5–4.6)
Potassium: 3.5 mmol/L (ref 3.5–5.1)
Sodium: 135 mmol/L (ref 135–145)

## 2020-03-29 MED ORDER — DIPHENHYDRAMINE HCL 25 MG PO CAPS
ORAL_CAPSULE | ORAL | Status: AC
  Filled 2020-03-29: qty 1

## 2020-03-29 MED ORDER — ACETAMINOPHEN 325 MG PO TABS
ORAL_TABLET | ORAL | Status: AC
  Filled 2020-03-29: qty 2

## 2020-03-29 MED ORDER — CALCIUM CARBONATE ANTACID 500 MG PO CHEW
2.0000 | CHEWABLE_TABLET | ORAL | Status: AC
  Administered 2020-03-29 (×2): 400 mg via ORAL
  Filled 2020-03-29: qty 2

## 2020-03-29 MED ORDER — ACD FORMULA A 0.73-2.45-2.2 GM/100ML VI SOLN
1000.0000 mL | Status: DC
  Administered 2020-03-29: 1000 mL

## 2020-03-29 MED ORDER — DIPHENHYDRAMINE HCL 25 MG PO CAPS
25.0000 mg | ORAL_CAPSULE | Freq: Four times a day (QID) | ORAL | Status: DC | PRN
  Administered 2020-03-29: 25 mg via ORAL

## 2020-03-29 MED ORDER — CALCIUM GLUCONATE-NACL 2-0.675 GM/100ML-% IV SOLN
2.0000 g | INTRAVENOUS | Status: AC
  Administered 2020-03-29: 2000 mg via INTRAVENOUS
  Filled 2020-03-29: qty 100

## 2020-03-29 MED ORDER — PREDNISONE 10 MG PO TABS
40.0000 mg | ORAL_TABLET | Freq: Every day | ORAL | Status: DC
  Administered 2020-03-31 – 2020-04-05 (×5): 40 mg via ORAL
  Filled 2020-03-29 (×5): qty 2

## 2020-03-29 MED ORDER — ACETAMINOPHEN 325 MG PO TABS
650.0000 mg | ORAL_TABLET | ORAL | Status: DC | PRN
  Administered 2020-03-29: 650 mg via ORAL

## 2020-03-29 MED ORDER — ACD FORMULA A 0.73-2.45-2.2 GM/100ML VI SOLN
Status: AC
  Filled 2020-03-29: qty 500

## 2020-03-29 MED ORDER — CALCIUM CARBONATE ANTACID 500 MG PO CHEW
CHEWABLE_TABLET | ORAL | Status: AC
  Filled 2020-03-29: qty 2

## 2020-03-29 MED ORDER — ANTICOAGULANT SODIUM CITRATE 4% (200MG/5ML) IV SOLN
5.0000 mL | Freq: Once | Status: AC
  Administered 2020-03-29: 5 mL
  Filled 2020-03-29: qty 5

## 2020-03-29 NOTE — Progress Notes (Signed)
Physical Therapy Treatment Patient Details Name: Jasmine White MRN: [id number] DOB: [birth year] Today's Date: 03/29/2020    History of Present Illness 74 y.o. female with medical history significant of chronic back pain, HTN, hypothyroidism, recently diagnosed CKD, HLD, Hx of COVID infection and recovery with persistent fatigue who presented 03/18/20 with nausea/vomiting x 9 days. Severe hypovolemic hyponatremia, AKI, UTI, HTN    PT Comments    Patient reports no nausea and no further dizziness since treatment for BPPV. Assessed VOR and noted rt vestibular hypofunction. Instructed in VOR x1 exercises and provided handout. Patient able to walk 60 feet without overt loss of balance, although admits to feeling weak and slightly unsteady. Noted CIR has signed off on patient as she is now too high level for their program. Now recommend HHPT and 24/7 care by family due to decr cognition and fall risk. Of note, patient with increased confusion this date compared to prior visits. Almost manic/hypomanic in her presentation.      Follow Up Recommendations  Home health PT;Supervision/Assistance - 24 hour     Equipment Recommendations  None recommended by PT    Recommendations for Other Services       Precautions / Restrictions Precautions Precautions: Fall    Mobility  Bed Mobility Overal bed mobility: Modified Independent Bed Mobility: Supine to Sit     Supine to sit: Modified independent (Device/Increase time);HOB elevated Sit to supine: Modified independent (Device/Increase time)      Transfers Overall transfer level: Needs assistance Equipment used: None Transfers: Sit to/from Stand Sit to Stand: Supervision         General transfer comment: supervision for safety  Ambulation/Gait Ambulation/Gait assistance: Min assist Gait Distance (Feet): 30 Feet (60) Assistive device: None Gait Pattern/deviations: Step-through pattern;Decreased stride length;Staggering right Gait  velocity: decr   General Gait Details: while distracted by video, lost her balance with staggering steps x 2 with min assist to recover; with focus on walking, no LOB   Stairs             Wheelchair Mobility    Modified Rankin (Stroke Patients Only)       Balance Overall balance assessment: Needs assistance Sitting-balance support: No upper extremity supported;Feet supported Sitting balance-Leahy Scale: Good Sitting balance - Comments: Bend forward and don socks   Standing balance support: During functional activity;No upper extremity supported Standing balance-Leahy Scale: Good             Rhomberg - Eyes Closed: 10 (feet apart, not rhomberg) High level balance activites: Backward walking;Head turns (no dizziness or imbalance with static standing head turns) High Level Balance Comments: slow marching with pt balancing on one foot for at least 3 seconds each leg            Cognition Arousal/Alertness: Awake/alert Behavior During Therapy: Restless (towards manic) Overall Cognitive Status: Impaired/Different from baseline Area of Impairment: Orientation;Attention;Safety/judgement;Problem solving                 Orientation Level: Situation Current Attention Level: Sustained     Safety/Judgement: Decreased awareness of safety   Problem Solving: Difficulty sequencing;Slow processing;Requires verbal cues General Comments: Very excited she is feeling better without dizziness or nausea; repeatedly watching a video on her phone with calming music insisting "I'm working on those crystals you found. This is causing them to flow down the tower and burn them up." She could not put down the phone/playing the video during the initial ambulation, insisting she couldn't stop "working on the  crystals." Attempted to explain video was not working on her inner ear crystals, but there was no reasoning with pt. She lost her balance several times with min assist to recover  while trying to walk and watch video simultaneously. Eventually she hit the wrong area on her phone and could not automatically replay video. She could not problem-solve how to get back to the video and spent several minutes making the same choices on phone with same results (not getting her to the video). Ultimately convinced her to walk while video was not playing and she had no LOB when focusing on her walking.      Exercises Other Exercises Other Exercises: instructed with handout in VORx1 both horizontally and vertically. Reviewed instructions multiple times and pt with difficulty focusing and could not recite back the instructions. Was able to demonstrate correct technique    General Comments        Pertinent Vitals/Pain Pain Assessment: No/denies pain    Home Living                      Prior Function            PT Goals (current goals can now be found in the care plan section) Acute Rehab PT Goals Patient Stated Goal: get strong enough to go home Time For Goal Achievement: 04/02/20 Potential to Achieve Goals: Good Progress towards PT goals: Progressing toward goals    Frequency    Min 3X/week      PT Plan Discharge plan needs to be updated    Co-evaluation              AM-PAC PT "6 Clicks" Mobility   Outcome Measure  Help needed turning from your back to your side while in a flat bed without using bedrails?: None Help needed moving from lying on your back to sitting on the side of a flat bed without using bedrails?: A Little Help needed moving to and from a bed to a chair (including a wheelchair)?: A Little Help needed standing up from a chair using your arms (e.g., wheelchair or bedside chair)?: A Little Help needed to walk in hospital room?: A Little Help needed climbing 3-5 steps with a railing? : A Little 6 Click Score: 19    End of Session Equipment Utilized During Treatment: Gait belt Activity Tolerance: Patient tolerated treatment  well Patient left: with call bell/phone within reach;in bed;with bed alarm set Nurse Communication: Other (comment) (more confused) PT Visit Diagnosis: Muscle weakness (generalized) (M62.81);Dizziness and giddiness (R42);Other abnormalities of gait and mobility (R26.89)     Time: 0071-2197 PT Time Calculation (min) (ACUTE ONLY): 45 min  Charges:  $Therapeutic Exercise: 8-22 mins $Therapeutic Activity: 8-22 mins                      Arby Barrette, PT Pager [phone]    Rexanne Mano 03/29/2020, 9:44 AM

## 2020-03-29 NOTE — Progress Notes (Addendum)
Patient ID: Jasmine White, female   DOB: Dec 03, 1945, 74 y.o.   MRN: [id number] S: Feels "great", no complaints O:BP (!) 165/83 (BP Location: Left Arm)   Pulse 88   Temp 98.2 F (36.8 C) (Oral)   Resp 20   Ht 5\' 6"  (1.676 m)   Wt 61.5 kg   SpO2 98%   BMI 21.88 kg/m   Intake/Output Summary (Last 24 hours) at 03/29/2020 1108 Last data filed at 03/29/2020 0900 Gross per 24 hour  Intake 716 ml  Output 1009 ml  Net -293 ml   Intake/Output: I/O last 3 completed shifts: In: 720 [P.O.:720] Out: 1009 [Other:1009]  Intake/Output this shift:  Total I/O In: 236 [P.O.:236] Out: -  Weight change:  Gen: NAD CVS: no rub Resp: cta Abd: benign Ext: no edema  Recent Labs  Lab 03/23/20 0228 03/23/20 0228 03/23/20 0815 03/23/20 0815 03/23/20 1247 03/24/20 0226 03/25/20 0159 03/26/20 0605 03/26/20 1516 03/27/20 1125 03/28/20 0757  NA 135   < > 135   < > 135 137 135 136 136 137 136  K 4.6   < > 3.9   < > 3.9 3.6 3.7 3.4* 4.0 3.0* 3.2*  CL 97*   < > 96*   < > 95* 96* 95* 95* 98 93* 92*  CO2 28   < > 25  --   --  30 29 26 28 28 28   GLUCOSE 147*   < > 158*   < > 148* 113* 112* 95 109* 146* 205*  BUN 30*   < > 35*   < > 37* 50* 29* 54* 14 33* 49*  CREATININE 3.32*   < > 3.85*   < > 4.00* 4.68* 2.91* 5.01* 2.10* 3.73* 5.00*  ALBUMIN 3.1*  --   --   --   --  3.0* 3.0* 2.8*  --  2.8* 2.9*  CALCIUM 8.7*   < > 8.7*  --   --  8.9 8.7* 8.5* 8.0* 8.2* 8.6*  PHOS 3.7  --   --   --   --  3.8 2.7 3.1  --  2.7 3.5   < > = values in this interval not displayed.   Liver Function Tests: Recent Labs  Lab 03/26/20 0605 03/27/20 1125 03/28/20 0757  ALBUMIN 2.8* 2.8* 2.9*   No results for input(s): LIPASE, AMYLASE in the last 168 hours. No results for input(s): AMMONIA in the last 168 hours. CBC: Recent Labs  Lab 03/24/20 1725 03/24/20 1725 03/25/20 0159 03/25/20 0159 03/26/20 0605 03/27/20 1235 03/28/20 0757  WBC 18.8*   < > 15.9*   < > 18.8* 18.0* 18.0*  HGB 10.5*   < > 9.2*   < >  8.9* 8.8* 8.1*  HCT 31.8*   < > 27.7*   < > 27.7* 27.7* 25.7*  MCV 84.8  --  83.9  --  86.0 86.6 88.9  PLT 198   < > 165   < > 161 138* 145*   < > = values in this interval not displayed.   Cardiac Enzymes: No results for input(s): CKTOTAL, CKMB, CKMBINDEX, TROPONINI in the last 168 hours. CBG: No results for input(s): GLUCAP in the last 168 hours.  Iron Studies: No results for input(s): IRON, TIBC, TRANSFERRIN, FERRITIN in the last 72 hours. Studies/Results: No results found. . calcium carbonate      . diphenhydrAMINE      . sodium chloride   Intravenous Once  . amLODipine  10 mg  Oral Daily  . Chlorhexidine Gluconate Cloth  6 each Topical Q0600  . cholecalciferol  1,000 Units Oral Daily  . cyclophosphamide  50 mg Oral q1800  . darbepoetin (ARANESP) injection - NON-DIALYSIS  40 mcg Subcutaneous Q Thu-1800  . docusate sodium  200 mg Oral BID  . heparin injection (subcutaneous)  5,000 Units Subcutaneous Q8H  . hydrALAZINE  100 mg Oral TID  . levothyroxine  88 mcg Oral Q0600  . metoprolol tartrate  100 mg Oral BID  . pantoprazole  40 mg Oral Daily  . polyethylene glycol  17 g Oral BID  . predniSONE  60 mg Oral Q breakfast  . sulfamethoxazole-trimethoprim  1 tablet Oral Q M,W,F    BMET    Component Value Date/Time   NA 136 03/28/2020 0757   NA 128 (L) 03/01/2020 0927   K 3.2 (L) 03/28/2020 0757   CL 92 (L) 03/28/2020 0757   CO2 28 03/28/2020 0757   GLUCOSE 205 (H) 03/28/2020 0757   BUN 49 (H) 03/28/2020 0757   BUN 67 (H) 03/01/2020 0927   CREATININE 5.00 (H) 03/28/2020 0757   CREATININE 0.84 12/21/2012 1552   CALCIUM 8.6 (L) 03/28/2020 0757   GFRNONAA 8 (L) 03/28/2020 0757   GFRNONAA 72 12/21/2012 1552   GFRAA 9 (L) 03/28/2020 0757   GFRAA 83 12/21/2012 1552   CBC    Component Value Date/Time   WBC 18.0 (H) 03/28/2020 0757   RBC 2.89 (L) 03/28/2020 0757   HGB 8.1 (L) 03/28/2020 0757   HGB 9.7 (L) 03/01/2020 0927   HCT 25.7 (L) 03/28/2020 0757   HCT 28.8 (L)  03/01/2020 0927   PLT 145 (L) 03/28/2020 0757   PLT 397 03/01/2020 0927   MCV 88.9 03/28/2020 0757   MCV 79 03/01/2020 0927   MCH 28.0 03/28/2020 0757   MCHC 31.5 03/28/2020 0757   RDW 14.8 03/28/2020 0757   RDW 14.7 03/01/2020 0927   LYMPHSABS 0.3 (L) 03/22/2020 0103   LYMPHSABS 1.3 03/01/2020 0927   MONOABS 0.3 03/22/2020 0103   EOSABS 0.0 03/22/2020 0103   EOSABS 0.2 03/01/2020 0927   BASOSABS 0.0 03/22/2020 0103   BASOSABS 0.1 03/01/2020 0927    Assessment/Plan:  1. 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 Started on daily plasmapheresis and IV solumedrol/po cytoxan. HD x2. 1. Biopsy with 100% crescents and significant amount of ATN. 2. [place] continue with HD on MWF schedule for now. 3. For pheresis with FFP due to possible alveolar hemorrhagedaily. 4. Continue cytoxan 50 mg daily  5. Today for 7th out of 14 sessions of pheresis 6. Bactrim MWF for PCP prophylaxis 7. Continue to follow for renal recovery 8. Set up for outpatient HD on MWF schedule at [facility].   9. Will plan for next HD session tomorrow.  2. Hyponatremia- due to hypovolemia and concomitant HCTZ use and AKI. Now resolved. 3. Tremors- likely due to IV solumedrol 4. HTN- follow with HD and UF 5. Vascular access- RIJ TDC placed 03/20/20 by IR  [name], MD [facility] [phone]

## 2020-03-29 NOTE — Progress Notes (Addendum)
Renal Navigator followed up with patient and her daughter regarding OP HD schedule. Patient is very Patent attorney. [facility] Schedule Letter given to patient. Patient's daughter (contacted by phone) understanding of schedule, however, states that she and her step-father may be interested in having patient continue to follow with [facility], as they have been extremely pleased with care received here in the hospital and now that she has had the opportunity to speak with Renal Navigator, understands that there would be a change in Nephrology groups if patient dialyzes in [place]. Navigator apologizes that this was not explained by staff last week and offers to re-refer to [facility] in [place] if they wish. Patient's daughter is very appreciative and will speak with her mother and step-father and get back to Navigator if they would like to switch OP HD clinics.  UPDATE: Renal Navigator received call on 03/30/20 AM from patient's daughter stating that she has spoken with her mother and step father and they all agree that they would like to continue care with [place] and request OP HD treatment at [facility]. Navigator discussed with Dr. Marval Regal and he agrees to accept. He notes that she may need access placed next week if no signs of recovery. Navigator has referred patient to [facility] and will see if they can accommodate her with a seat schedule.  Alphonzo Cruise, [place] Renal Navigator [phone]

## 2020-03-29 NOTE — Plan of Care (Signed)
  Problem: Education: Goal: Knowledge of General Education information will improve Description: Including pain rating scale, medication(s)/side effects and non-pharmacologic comfort measures Outcome: Progressing   Problem: Clinical Measurements: Goal: Ability to maintain clinical measurements within normal limits will improve Outcome: Progressing Goal: Diagnostic test results will improve Outcome: Progressing   Problem: Activity: Goal: Risk for activity intolerance will decrease Outcome: Progressing Note: Ambulated to restroom w/ 1x assist.   Problem: Nutrition: Goal: Adequate nutrition will be maintained Outcome: Progressing   Problem: Elimination: Goal: Will not experience complications related to bowel motility Outcome: Progressing Note: Last BM 7/7 Goal: Will not experience complications related to urinary retention Outcome: Progressing

## 2020-03-29 NOTE — Progress Notes (Addendum)
PROGRESS NOTE        PATIENT DETAILS Name: Jasmine White Age: 74 y.o. Sex: female Date of Birth: 04-May-1946 Admit Date: 03/17/2020 Admitting Physician Clance Boll, MD VXY:[id number], [name], FNP  Brief Narrative: Patient is a 74 y.o. female with history of HTN, hypothyroidism, HLD, recently diagnosed by PCP with renal dysfunction, history of COVID-19 infection in November 2020-since then been having persistent fatigue-who presented with persistent nausea/vomiting-further evaluation revealed severe hyponatremia with a sodium of 109, worsening renal function with a creatinine of 7.12- she was admitted-provided supportive care-subsequently nephrology was consulted-she underwent renal biopsy-which showed 100% crescents and significant amount of ATN.  She was subsequently started on plasmapheresis, Solu-Medrol/Cytoxan and also on hemodialysis.  See below for further details.  Significant events: None  Significant studies: 6/26>> CT abdomen pelvis: Large volume of inspissated fecal material throughout the colon, moderately distended urinary bladder, cholelithiasis 6/27>> RUQ ultrasound: Cholelithiasis without evidence of cholecystitis 7/1>> VQ scan: Pulmonary embolism absent 7/1>> bilateral lower extremity Doppler: No DVT 7/3>> CT chest: Bilateral areas of mosaic groundglass and consolidative opacity-are nonspecific-in the setting of anti-GBM antibodies very early/developing alveolar hemorrhage could present with similar appearance.  Antimicrobial therapy: 6/28>> urine culture: No growth  Microbiology data:  None  Procedures : 6/29>> HD tunneled dialysis catheter placement by IR 7/1>> renal biopsy:with Anti-GBM disease with nearly 100% crescents and significant amount of ATN  Consults: Nephrology  DVT Prophylaxis : heparin injection 5,000 Units Start: 03/23/20 1800 Place and maintain sequential compression device Start: 03/20/20  0746  Subjective: Lying comfortably in bed.  Answering all my questions appropriately.  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  Remains on steroids, Cytoxan and Bactrim MWF for PCP prophylaxis.  Continue to follow closely for renal recovery.  Alveolar hemorrhage: No hemoptysis-not hypoxic-see CT chest above-started on plasmapheresis daily plasmapheresis x2 weeks (7/14 days).    Hyponatremia: Secondary to hypovolemia/HCTZ use-and AKI-resolved with supportive care.  Normocytic anemia: Probably related to CKD-has required 1 unit of PRBC so far.  SPEP-without M spike  Constipation: Resolved.  Hypothyroidism: Continue Synthroid-TSH stable.  Deconditioning: Rehab services following  Note-daughter over the phone concerned that mom is much more "hyper" than usual.  Patient appeared relatively okay with me this morning.  She is on high-dose steroids-that could be the causative agent.  Spoke with Dr. Coladonato-decreased prednisone to 40 mg daily.  Will follow/monitor to see if this improves her mental status.   Diet: Diet Order            Diet renal with fluid restriction Fluid restriction: 1200 mL Fluid; Room service appropriate? Yes; Fluid consistency: Thin  Diet effective now                  Code Status: Full code   Family Communication: Daughter [name] over the phone  Disposition Plan: Status is: Inpatient  Remains inpatient appropriate because:Inpatient level of care appropriate due to severity of illness   Dispo: The patient is from: Home              Anticipated d/c is to: Home              Anticipated d/c date is: > 3 days  Patient currently is not medically stable to d/c.  Barriers to Discharge: Requires daily plasmapheresis x2 weeks total-before discharge with outpatient HD.  Antimicrobial  agents: Anti-infectives (From admission, onward)   Start     Dose/Rate Route Frequency Ordered Stop   03/26/20 1000  sulfamethoxazole-trimethoprim (BACTRIM) 400-80 MG per tablet 1 tablet     Discontinue     1 tablet Oral Every M-W-F 03/25/20 0954     03/25/20 2200  sulfamethoxazole-trimethoprim (BACTRIM) 400-80 MG per tablet 1 tablet  Status:  Discontinued        1 tablet Oral Daily at bedtime 03/25/20 0854 03/25/20 0921   03/20/20 1319  ceFAZolin (ANCEF) 2-4 GM/100ML-% IVPB       Note to Pharmacy: Arlean Hopping   : cabinet override      03/20/20 1319 03/21/20 0129   03/20/20 1100  ceFAZolin (ANCEF) IVPB 2g/100 mL premix        2 g 200 mL/hr over 30 Minutes Intravenous On call 03/20/20 1040 03/20/20 1506   03/19/20 0000  cefTRIAXone (ROCEPHIN) 1 g in sodium chloride 0.9 % 100 mL IVPB       Note to Pharmacy: 1st dose now   1 g 200 mL/hr over 30 Minutes Intravenous Every 24 hours 03/18/20 0127 03/21/20 0008   03/18/20 0145  cefTRIAXone (ROCEPHIN) 1 g in sodium chloride 0.9 % 100 mL IVPB       Note to Pharmacy: 1st dose now   1 g 200 mL/hr over 30 Minutes Intravenous  Once 03/18/20 0135 03/18/20 0252       Time spent: 25- minutes-Greater than 50% of this time was spent in counseling, explanation of diagnosis, planning of further management, and coordination of care.  MEDICATIONS: Scheduled Meds: . sodium chloride   Intravenous Once  . amLODipine  10 mg Oral Daily  . calcium carbonate      . Chlorhexidine Gluconate Cloth  6 each Topical Q0600  . cholecalciferol  1,000 Units Oral Daily  . cyclophosphamide  50 mg Oral q1800  . darbepoetin (ARANESP) injection - NON-DIALYSIS  40 mcg Subcutaneous Q Thu-1800  . docusate sodium  200 mg Oral BID  . heparin injection (subcutaneous)  5,000 Units Subcutaneous Q8H  . hydrALAZINE  100 mg Oral TID  . levothyroxine  88 mcg Oral Q0600  . metoprolol tartrate  100 mg Oral BID  . pantoprazole  40 mg Oral Daily  . polyethylene glycol  17 g Oral  BID  . predniSONE  60 mg Oral Q breakfast  . sulfamethoxazole-trimethoprim  1 tablet Oral Q M,W,F   Continuous Infusions: . anticoagulant sodium citrate    . calcium gluconate 2,000 mg (03/29/20 1104)  . citrate dextrose     PRN Meds:.acetaminophen, acetaminophen, albuterol, diphenhydrAMINE, [DISCONTINUED] ondansetron **OR** ondansetron (ZOFRAN) IV, technetium albumin aggregated   PHYSICAL EXAM: Vital signs: Vitals:   03/29/20 1147 03/29/20 1155 03/29/20 1208 03/29/20 1217  BP: (!) 156/75 (!) 154/81 (!) 157/82 (!) 160/78  Pulse: 86 87 92 92  Resp: 12 17 (!) 21 20  Temp: 98.5 F (36.9 C) (!) 97.4 F (36.3 C) (!) 97.2 F (36.2 C) (!) 97.2 F (36.2 C)  TempSrc: Oral Oral Oral Oral  SpO2:      Weight:      Height:       Filed Weights   03/27/20 0400 03/28/20 0731 03/28/20 1113  Weight: 62 kg 62.3 kg 61.5 kg   Body mass index is 21.88 kg/m.   Gen Exam:Alert awake-not in  any distress HEENT:atraumatic, normocephalic Chest: B/L clear to auscultation anteriorly CVS:S1S2 regular Abdomen:soft non tender, non distended Extremities:no edema Neurology: Non focal Skin: no rash  I have personally reviewed following labs and imaging studies  LABORATORY DATA: CBC: Recent Labs  Lab 03/24/20 1725 03/25/20 0159 03/26/20 0605 03/27/20 1235 03/28/20 0757  WBC 18.8* 15.9* 18.8* 18.0* 18.0*  HGB 10.5* 9.2* 8.9* 8.8* 8.1*  HCT 31.8* 27.7* 27.7* 27.7* 25.7*  MCV 84.8 83.9 86.0 86.6 88.9  PLT 198 165 161 138* 145*    Basic Metabolic Panel: Recent Labs  Lab 03/24/20 0226 03/24/20 0226 03/25/20 0159 03/26/20 0605 03/26/20 1516 03/27/20 1125 03/28/20 0757  NA 137   < > 135 136 136 137 136  K 3.6   < > 3.7 3.4* 4.0 3.0* 3.2*  CL 96*   < > 95* 95* 98 93* 92*  CO2 30   < > 29 26 28 28 28   GLUCOSE 113*   < > 112* 95 109* 146* 205*  BUN 50*   < > 29* 54* 14 33* 49*  CREATININE 4.68*   < > 2.91* 5.01* 2.10* 3.73* 5.00*  CALCIUM 8.9   < > 8.7* 8.5* 8.0* 8.2* 8.6*  PHOS 3.8   --  2.7 3.1  --  2.7 3.5   < > = values in this interval not displayed.    GFR: Estimated Creatinine Clearance: 9.2 mL/min (A) (by C-G formula based on SCr of 5 mg/dL (H)).  Liver Function Tests: Recent Labs  Lab 03/24/20 0226 03/25/20 0159 03/26/20 0605 03/27/20 1125 03/28/20 0757  ALBUMIN 3.0* 3.0* 2.8* 2.8* 2.9*   No results for input(s): LIPASE, AMYLASE in the last 168 hours. No results for input(s): AMMONIA in the last 168 hours.  Coagulation Profile: No results for input(s): INR, PROTIME in the last 168 hours.  Cardiac Enzymes: No results for input(s): CKTOTAL, CKMB, CKMBINDEX, TROPONINI in the last 168 hours.  BNP (last 3 results) No results for input(s): PROBNP in the last 8760 hours.  Lipid Profile: No results for input(s): CHOL, HDL, LDLCALC, TRIG, CHOLHDL, LDLDIRECT in the last 72 hours.  Thyroid Function Tests: No results for input(s): TSH, T4TOTAL, FREET4, T3FREE, THYROIDAB in the last 72 hours.  Anemia Panel: No results for input(s): VITAMINB12, FOLATE, FERRITIN, TIBC, IRON, RETICCTPCT in the last 72 hours.  Urine analysis:    Component Value Date/Time   COLORURINE AMBER (A) 03/18/2020 2143   APPEARANCEUR HAZY (A) 03/18/2020 2143   APPEARANCEUR Clear 03/03/2019 0920   LABSPEC 1.008 03/18/2020 2143   PHURINE 6.0 03/18/2020 2143   GLUCOSEU NEGATIVE 03/18/2020 2143   HGBUR LARGE (A) 03/18/2020 2143   BILIRUBINUR NEGATIVE 03/18/2020 2143   BILIRUBINUR Negative 03/03/2019 0920   KETONESUR NEGATIVE 03/18/2020 2143   PROTEINUR 100 (A) 03/18/2020 2143   UROBILINOGEN negative 12/21/2012 1509   NITRITE NEGATIVE 03/18/2020 2143   LEUKOCYTESUR SMALL (A) 03/18/2020 2143    Sepsis Labs: Lactic Acid, Venous No results found for: LATICACIDVEN  MICROBIOLOGY: No results found for this or any previous visit (from the past 240 hour(s)).  RADIOLOGY STUDIES/RESULTS: No results found.   LOS: 12 days   Oren Binet, MD  [facility]    To  contact the attending provider between 7A-7P or the covering provider during after hours 7P-7A, please log into the web site [email or web address] and access using universal [place] password for that web site. If you do not have the password, please call the hospital operator.  03/29/2020, 12:30 PM

## 2020-03-30 LAB — THERAPEUTIC PLASMA EXCHANGE (BLOOD BANK)
Plasma Exchange: 3000
Plasma volume needed: 3000
Unit division: 0
Unit division: 0
Unit division: 0
Unit division: 0
Unit division: 0
Unit division: 0
Unit division: 0
Unit division: 0
Unit division: 0
Unit division: 0

## 2020-03-30 LAB — CBC
HCT: 22.3 % — ABNORMAL LOW (ref 36.0–46.0)
Hemoglobin: 7.1 g/dL — ABNORMAL LOW (ref 12.0–15.0)
MCH: 28.5 pg (ref 26.0–34.0)
MCHC: 31.8 g/dL (ref 30.0–36.0)
MCV: 89.6 fL (ref 80.0–100.0)
Platelets: 146 10*3/uL — ABNORMAL LOW (ref 150–400)
RBC: 2.49 MIL/uL — ABNORMAL LOW (ref 3.87–5.11)
RDW: 15.6 % — ABNORMAL HIGH (ref 11.5–15.5)
WBC: 15.7 10*3/uL — ABNORMAL HIGH (ref 4.0–10.5)
nRBC: 0 % (ref 0.0–0.2)

## 2020-03-30 LAB — RENAL FUNCTION PANEL
Albumin: 2.9 g/dL — ABNORMAL LOW (ref 3.5–5.0)
Anion gap: 12 (ref 5–15)
BUN: 38 mg/dL — ABNORMAL HIGH (ref 8–23)
CO2: 31 mmol/L (ref 22–32)
Calcium: 8.8 mg/dL — ABNORMAL LOW (ref 8.9–10.3)
Chloride: 90 mmol/L — ABNORMAL LOW (ref 98–111)
Creatinine, Ser: 4.5 mg/dL — ABNORMAL HIGH (ref 0.44–1.00)
GFR calc Af Amer: 10 mL/min — ABNORMAL LOW (ref >60)
GFR calc non Af Amer: 9 mL/min — ABNORMAL LOW (ref >60)
Glucose, Bld: 95 mg/dL (ref 70–99)
Phosphorus: 4.1 mg/dL (ref 2.5–4.6)
Potassium: 3.3 mmol/L — ABNORMAL LOW (ref 3.5–5.1)
Sodium: 133 mmol/L — ABNORMAL LOW (ref 135–145)

## 2020-03-30 MED ORDER — ACETAMINOPHEN 325 MG PO TABS
ORAL_TABLET | ORAL | Status: AC
  Filled 2020-03-30: qty 2

## 2020-03-30 MED ORDER — CALCIUM CARBONATE ANTACID 500 MG PO CHEW: 2.0000 | CHEWABLE_TABLET | ORAL | Status: AC

## 2020-03-30 MED ORDER — CALCIUM CARBONATE ANTACID 500 MG PO CHEW
CHEWABLE_TABLET | ORAL | Status: AC
  Administered 2020-03-30: 400 mg
  Filled 2020-03-30: qty 2

## 2020-03-30 MED ORDER — DIPHENHYDRAMINE HCL 25 MG PO CAPS
ORAL_CAPSULE | ORAL | Status: AC
  Filled 2020-03-30: qty 1

## 2020-03-30 MED ORDER — CALCIUM GLUCONATE-NACL 2-0.675 GM/100ML-% IV SOLN
2.0000 g | Freq: Once | INTRAVENOUS | Status: AC
  Administered 2020-03-30: 2000 mg via INTRAVENOUS
  Filled 2020-03-30: qty 100

## 2020-03-30 MED ORDER — ACD FORMULA A 0.73-2.45-2.2 GM/100ML VI SOLN
Status: AC
  Filled 2020-03-30: qty 500

## 2020-03-30 MED ORDER — DIPHENHYDRAMINE HCL 25 MG PO CAPS
25.0000 mg | ORAL_CAPSULE | Freq: Four times a day (QID) | ORAL | Status: DC | PRN
  Administered 2020-03-30: 25 mg via ORAL

## 2020-03-30 MED ORDER — ACD FORMULA A 0.73-2.45-2.2 GM/100ML VI SOLN: 1000.0000 mL | Status: DC

## 2020-03-30 MED ORDER — ACETAMINOPHEN 325 MG PO TABS
650.0000 mg | ORAL_TABLET | ORAL | Status: DC | PRN
  Administered 2020-03-30: 650 mg via ORAL

## 2020-03-30 MED ORDER — ANTICOAGULANT SODIUM CITRATE 4% (200MG/5ML) IV SOLN
5.0000 mL | Freq: Once | Status: DC
  Filled 2020-03-30: qty 5

## 2020-03-30 NOTE — Progress Notes (Signed)
PROGRESS NOTE        PATIENT DETAILS Name: Jasmine White Age: 74 y.o. Sex: female Date of Birth: [birth year] Admit Date: 03/17/2020 Admitting Physician Clance Boll, MD NWG:NFAOZH, [name], FNP  Brief Narrative: Patient is a 74 y.o. female with history of HTN, hypothyroidism, HLD, recently diagnosed by PCP with renal dysfunction, history of COVID-19 infection in November 2020-since then been having persistent fatigue-who presented with persistent nausea/vomiting-further evaluation revealed severe hyponatremia with a sodium of 109, worsening renal function with a creatinine of 7.12- she was admitted-provided supportive care-subsequently nephrology was consulted-she underwent renal biopsy-which showed 100% crescents and significant amount of ATN.  She was subsequently started on plasmapheresis, Solu-Medrol/Cytoxan and also on hemodialysis.  See below for further details.  Significant events: None  Significant studies: 6/26>> CT abdomen pelvis: Large volume of inspissated fecal material throughout the colon, moderately distended urinary bladder, cholelithiasis 6/27>> RUQ ultrasound: Cholelithiasis without evidence of cholecystitis 7/1>> VQ scan: Pulmonary embolism absent 7/1>> bilateral lower extremity Doppler: No DVT 7/3>> CT chest: Bilateral areas of mosaic groundglass and consolidative opacity-are nonspecific-in the setting of anti-GBM antibodies very early/developing alveolar hemorrhage could present with similar appearance.  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  Remains on steroids, Cytoxan and Bactrim MWF for PCP prophylaxis.  Due to neurological side effects/hyperactivity-steroids doses being stable tapered.  Continue to follow closely for renal recovery.  Alveolar hemorrhage: No hemoptysis-not hypoxic-see CT chest above-started on plasmapheresis daily plasmapheresis x2 weeks (8/14 days).    Acute encephalopathy: Secondary to steroids-less jittery/hyper today-steroids being tapered down-dose reduced to 40 mg.  Continue to follow closely.  Hyponatremia: Secondary to hypovolemia/HCTZ use-and AKI-resolved with supportive care.  Normocytic anemia: Probably related to CKD-has required 1 unit of PRBC so far.  SPEP-without M spike.  Hemoglobin dropped down to 7.1 today-we will repeat CBC tomorrow-May require PRBC transfusion.  Constipation: Resolved.  Hypothyroidism: Continue Synthroid-TSH stable.  Deconditioning: Rehab services following  Diet: Diet Order            Diet renal with fluid restriction Fluid restriction: 1200 mL Fluid; Room service appropriate? Yes; Fluid consistency: Thin  Diet effective now                  Code Status: Full code   Family Communication: Daughter [name] over the phone on 7/9  Disposition Plan: Status is: Inpatient  Remains inpatient appropriate because:Inpatient level of care appropriate due to severity of illness  Dispo: The patient is from: Home              Anticipated d/c is to: Home              Anticipated d/c date is: > 3 days              Patient currently is not medically stable to d/c.  Barriers  to Discharge: Requires daily plasmapheresis x2 weeks total-before discharge with outpatient HD.  Antimicrobial  agents: Anti-infectives (From admission, onward)   Start     Dose/Rate Route Frequency Ordered Stop   03/26/20 1000  sulfamethoxazole-trimethoprim (BACTRIM) 400-80 MG per tablet 1 tablet     Discontinue     1 tablet Oral Every M-W-F 03/25/20 0954     03/25/20 2200  sulfamethoxazole-trimethoprim (BACTRIM) 400-80 MG per tablet 1 tablet  Status:  Discontinued        1 tablet Oral Daily at bedtime 03/25/20 0854 03/25/20 0921   03/20/20 1319  ceFAZolin (ANCEF) 2-4 GM/100ML-% IVPB       Note to Pharmacy: Arlean Hopping   : cabinet override      03/20/20 1319 03/21/20 0129   03/20/20 1100  ceFAZolin (ANCEF) IVPB 2g/100 mL premix        2 g 200 mL/hr over 30 Minutes Intravenous On call 03/20/20 1040 03/20/20 1506   03/19/20 0000  cefTRIAXone (ROCEPHIN) 1 g in sodium chloride 0.9 % 100 mL IVPB       Note to Pharmacy: 1st dose now   1 g 200 mL/hr over 30 Minutes Intravenous Every 24 hours 03/18/20 0127 03/21/20 0008   03/18/20 0145  cefTRIAXone (ROCEPHIN) 1 g in sodium chloride 0.9 % 100 mL IVPB       Note to Pharmacy: 1st dose now   1 g 200 mL/hr over 30 Minutes Intravenous  Once 03/18/20 0135 03/18/20 0252       Time spent: 25- minutes-Greater than 50% of this time was spent in counseling, explanation of diagnosis, planning of further management, and coordination of care.  MEDICATIONS: Scheduled Meds: . sodium chloride   Intravenous Once  . acetaminophen      . amLODipine  10 mg Oral Daily  . Chlorhexidine Gluconate Cloth  6 each Topical Q0600  . cholecalciferol  1,000 Units Oral Daily  . cyclophosphamide  50 mg Oral q1800  . darbepoetin (ARANESP) injection - NON-DIALYSIS  40 mcg Subcutaneous Q Thu-1800  . diphenhydrAMINE      . docusate sodium  200 mg Oral BID  . heparin injection (subcutaneous)  5,000 Units Subcutaneous Q8H  . hydrALAZINE  100 mg Oral TID  . levothyroxine  88 mcg Oral Q0600  . metoprolol tartrate  100 mg Oral BID  . pantoprazole  40 mg Oral Daily  .  polyethylene glycol  17 g Oral BID  . predniSONE  40 mg Oral Q breakfast  . sulfamethoxazole-trimethoprim  1 tablet Oral Q M,W,F   Continuous Infusions: . anticoagulant sodium citrate    . calcium gluconate 2,000 mg (03/30/20 1459)  . citrate dextrose    . citrate dextrose    . citrate dextrose     PRN Meds:.acetaminophen, acetaminophen, albuterol, diphenhydrAMINE, [DISCONTINUED] ondansetron **OR** ondansetron (ZOFRAN) IV, technetium albumin aggregated   PHYSICAL EXAM: Vital signs: Vitals:   03/30/20 1143 03/30/20 1145 03/30/20 1445 03/30/20 1500  BP: (!) 122/58 139/68 124/64 (!) 110/91  Pulse: 85 96 98 (!) 111  Resp:  18 18 19   Temp:  98.3 F (36.8 C) 99.2 F (37.3 C) 98.9 F (37.2 C)  TempSrc:  Oral Oral Oral  SpO2:  96% 97%   Weight:  64.2 kg    Height:       Filed Weights   03/28/20 1113 03/30/20 0725 03/30/20 1145  Weight: 61.5 kg 65.4 kg 64.2 kg   Body mass index is 22.84 kg/m.   Gen Exam:Alert awake-not  in any distress HEENT:atraumatic, normocephalic Chest: B/L clear to auscultation anteriorly CVS:S1S2 regular Abdomen:soft non tender, non distended Extremities:no edema Neurology: Non focal Skin: no rash  I have personally reviewed following labs and imaging studies  LABORATORY DATA: CBC: Recent Labs  Lab 03/25/20 0159 03/26/20 0605 03/27/20 1235 03/28/20 0757 03/30/20 0753  WBC 15.9* 18.8* 18.0* 18.0* 15.7*  HGB 9.2* 8.9* 8.8* 8.1* 7.1*  HCT 27.7* 27.7* 27.7* 25.7* 22.3*  MCV 83.9 86.0 86.6 88.9 89.6  PLT 165 161 138* 145* 146*    Basic Metabolic Panel: Recent Labs  Lab 03/26/20 0605 03/26/20 0605 03/26/20 1516 03/27/20 1125 03/28/20 0757 03/29/20 1155 03/30/20 0752  NA 136   < > 136 137 136 135 133*  K 3.4*   < > 4.0 3.0* 3.2* 3.5 3.3*  CL 95*   < > 98 93* 92* 95* 90*  CO2 26   < > 28 28 28 28 31   GLUCOSE 95   < > 109* 146* 205* 137* 95  BUN 54*   < > 14 33* 49* 27* 38*  CREATININE 5.01*   < > 2.10* 3.73* 5.00* 3.61* 4.50*   CALCIUM 8.5*   < > 8.0* 8.2* 8.6* 8.4* 8.8*  PHOS 3.1  --   --  2.7 3.5 2.5 4.1   < > = values in this interval not displayed.    GFR: Estimated Creatinine Clearance: 10.3 mL/min (A) (by C-G formula based on SCr of 4.5 mg/dL (H)).  Liver Function Tests: Recent Labs  Lab 03/26/20 0605 03/27/20 1125 03/28/20 0757 03/29/20 1155 03/30/20 0752  ALBUMIN 2.8* 2.8* 2.9* 2.8* 2.9*   No results for input(s): LIPASE, AMYLASE in the last 168 hours. No results for input(s): AMMONIA in the last 168 hours.  Coagulation Profile: No results for input(s): INR, PROTIME in the last 168 hours.  Cardiac Enzymes: No results for input(s): CKTOTAL, CKMB, CKMBINDEX, TROPONINI in the last 168 hours.  BNP (last 3 results) No results for input(s): PROBNP in the last 8760 hours.  Lipid Profile: No results for input(s): CHOL, HDL, LDLCALC, TRIG, CHOLHDL, LDLDIRECT in the last 72 hours.  Thyroid Function Tests: No results for input(s): TSH, T4TOTAL, FREET4, T3FREE, THYROIDAB in the last 72 hours.  Anemia Panel: No results for input(s): VITAMINB12, FOLATE, FERRITIN, TIBC, IRON, RETICCTPCT in the last 72 hours.  Urine analysis:    Component Value Date/Time   COLORURINE AMBER (A) 03/18/2020 2143   APPEARANCEUR HAZY (A) 03/18/2020 2143   APPEARANCEUR Clear 03/03/2019 0920   LABSPEC 1.008 03/18/2020 2143   PHURINE 6.0 03/18/2020 2143   GLUCOSEU NEGATIVE 03/18/2020 2143   HGBUR LARGE (A) 03/18/2020 2143   BILIRUBINUR NEGATIVE 03/18/2020 2143   BILIRUBINUR Negative 03/03/2019 0920   KETONESUR NEGATIVE 03/18/2020 2143   PROTEINUR 100 (A) 03/18/2020 2143   UROBILINOGEN negative 12/21/2012 1509   NITRITE NEGATIVE 03/18/2020 2143   LEUKOCYTESUR SMALL (A) 03/18/2020 2143    Sepsis Labs: Lactic Acid, Venous No results found for: LATICACIDVEN  MICROBIOLOGY: No results found for this or any previous visit (from the past 240 hour(s)).  RADIOLOGY STUDIES/RESULTS: No results found.   LOS: 13 days    Oren Binet, MD  [facility]    To contact the attending provider between 7A-7P or the covering provider during after hours 7P-7A, please log into the web site [email or web address] and access using universal [place] password for that web site. If you do not have the password, please call the hospital operator.  03/30/2020, 3:23 PM

## 2020-03-30 NOTE — Progress Notes (Addendum)
Patient ID: Jasmine White, female   DOB: 12/03/1945, 74 y.o.   MRN: [id number] S: Has had some issues with agitation and difficulty with sleep likely related to steroids.  Otherwise feels fine just "hyper". O:BP (!) 147/73 (BP Location: Left Arm)   Pulse 82   Temp 97.9 F (36.6 C) (Oral)   Resp 16   Ht 5\' 6"  (1.676 m)   Wt 65.4 kg   SpO2 96%   BMI 23.27 kg/m   Intake/Output Summary (Last 24 hours) at 03/30/2020 0847 Last data filed at 03/29/2020 2100 Gross per 24 hour  Intake 836 ml  Output --  Net 836 ml   Intake/Output: I/O last 3 completed shifts: In: 2683 [P.O.:1316] Out: -   Intake/Output this shift:  No intake/output data recorded. Weight change:  Gen: NAD CVS: RRR, no rub Resp: cta Abd: benign Ext: no edema  Recent Labs  Lab 03/24/20 0226 03/24/20 0226 03/25/20 0159 03/26/20 0605 03/26/20 1516 03/27/20 1125 03/28/20 0757 03/29/20 1155 03/30/20 0752  NA 137   < > 135 136 136 137 136 135 133*  K 3.6   < > 3.7 3.4* 4.0 3.0* 3.2* 3.5 3.3*  CL 96*   < > 95* 95* 98 93* 92* 95* 90*  CO2 30   < > 29 26 28 28 28 28 31   GLUCOSE 113*   < > 112* 95 109* 146* 205* 137* 95  BUN 50*   < > 29* 54* 14 33* 49* 27* 38*  CREATININE 4.68*   < > 2.91* 5.01* 2.10* 3.73* 5.00* 3.61* 4.50*  ALBUMIN 3.0*  --  3.0* 2.8*  --  2.8* 2.9* 2.8* 2.9*  CALCIUM 8.9   < > 8.7* 8.5* 8.0* 8.2* 8.6* 8.4* 8.8*  PHOS 3.8  --  2.7 3.1  --  2.7 3.5 2.5 4.1   < > = values in this interval not displayed.   Liver Function Tests: Recent Labs  Lab 03/28/20 0757 03/29/20 1155 03/30/20 0752  ALBUMIN 2.9* 2.8* 2.9*   No results for input(s): LIPASE, AMYLASE in the last 168 hours. No results for input(s): AMMONIA in the last 168 hours. CBC: Recent Labs  Lab 03/25/20 0159 03/25/20 0159 03/26/20 0605 03/26/20 0605 03/27/20 1235 03/28/20 0757 03/30/20 0753  WBC 15.9*   < > 18.8*   < > 18.0* 18.0* 15.7*  HGB 9.2*   < > 8.9*   < > 8.8* 8.1* 7.1*  HCT 27.7*   < > 27.7*   < > 27.7* 25.7* 22.3*   MCV 83.9  --  86.0  --  86.6 88.9 89.6  PLT 165   < > 161   < > 138* 145* 146*   < > = values in this interval not displayed.   Cardiac Enzymes: No results for input(s): CKTOTAL, CKMB, CKMBINDEX, TROPONINI in the last 168 hours. CBG: No results for input(s): GLUCAP in the last 168 hours.  Iron Studies: No results for input(s): IRON, TIBC, TRANSFERRIN, FERRITIN in the last 72 hours. Studies/Results: No results found. . sodium chloride   Intravenous Once  . amLODipine  10 mg Oral Daily  . calcium carbonate  2 tablet Oral Q3H  . Chlorhexidine Gluconate Cloth  6 each Topical Q0600  . cholecalciferol  1,000 Units Oral Daily  . cyclophosphamide  50 mg Oral q1800  . darbepoetin (ARANESP) injection - NON-DIALYSIS  40 mcg Subcutaneous Q Thu-1800  . docusate sodium  200 mg Oral BID  . heparin injection (subcutaneous)  5,000 Units Subcutaneous Q8H  . hydrALAZINE  100 mg Oral TID  . levothyroxine  88 mcg Oral Q0600  . metoprolol tartrate  100 mg Oral BID  . pantoprazole  40 mg Oral Daily  . polyethylene glycol  17 g Oral BID  . predniSONE  40 mg Oral Q breakfast  . sulfamethoxazole-trimethoprim  1 tablet Oral Q M,W,F    BMET    Component Value Date/Time   NA 133 (L) 03/30/2020 0752   NA 128 (L) 03/01/2020 0927   K 3.3 (L) 03/30/2020 0752   CL 90 (L) 03/30/2020 0752   CO2 31 03/30/2020 0752   GLUCOSE 95 03/30/2020 0752   BUN 38 (H) 03/30/2020 0752   BUN 67 (H) 03/01/2020 0927   CREATININE 4.50 (H) 03/30/2020 0752   CREATININE 0.84 12/21/2012 1552   CALCIUM 8.8 (L) 03/30/2020 0752   GFRNONAA 9 (L) 03/30/2020 0752   GFRNONAA 72 12/21/2012 1552   GFRAA 10 (L) 03/30/2020 0752   GFRAA 83 12/21/2012 1552   CBC    Component Value Date/Time   WBC 15.7 (H) 03/30/2020 0753   RBC 2.49 (L) 03/30/2020 0753   HGB 7.1 (L) 03/30/2020 0753   HGB 9.7 (L) 03/01/2020 0927   HCT 22.3 (L) 03/30/2020 0753   HCT 28.8 (L) 03/01/2020 0927   PLT 146 (L) 03/30/2020 0753   PLT 397 03/01/2020 0927    MCV 89.6 03/30/2020 0753   MCV 79 03/01/2020 0927   MCH 28.5 03/30/2020 0753   MCHC 31.8 03/30/2020 0753   RDW 15.6 (H) 03/30/2020 0753   RDW 14.7 03/01/2020 0927   LYMPHSABS 0.3 (L) 03/22/2020 0103   LYMPHSABS 1.3 03/01/2020 0927   MONOABS 0.3 03/22/2020 0103   EOSABS 0.0 03/22/2020 0103   EOSABS 0.2 03/01/2020 0927   BASOSABS 0.0 03/22/2020 0103   BASOSABS 0.1 03/01/2020 0927      Assessment/Plan:  1. 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 Started on daily plasmapheresis and IV solumedrol/po cytoxan. HD x2. 1. Biopsy with 100% crescents and significant amount of ATN. 2. [place] continue with HD on MWF schedule for now. 3. For pheresis with FFP due to possible alveolar hemorrhagedaily. 4. Continue cytoxan 50 mg daily 5. Decrease prednisone to 40 mg daily and try to taper more quickly given agitation 6. Today for 8th out of 14 sessions ofpheresis 7. Bactrim MWF for PCP prophylaxis 8. Continue to follow for renal recovery, however doubtful given extensive crescents on biopsy.  Will need AVF/AVG placement next week prior to discharge if not sign of renal recovery. 9. Set up for outpatient HD on MWF schedule at [facility].  Continue with current schedule.  2. Hyponatremia- due to hypovolemia and concomitant HCTZ use and AKI. follow after UF. 3. HTN- follow with HD and UF 4. Vascular access- RIJ TDC placed 03/20/20 by IR  [name], MD [facility] [phone]

## 2020-03-30 NOTE — Progress Notes (Signed)
PT Cancellation Note  Patient Details Name: Jasmine White MRN: [id number] DOB: June 04, 1946   Cancelled Treatment:    Reason Eval/Treat Not Completed: Patient at procedure or test/unavailable; patient in HD all day, will attempt again another day.   Reginia Naas 03/30/2020, 4:40 PM Magda Kiel, PT Acute Rehabilitation Services [phone] [phone] 03/30/2020

## 2020-03-31 LAB — BASIC METABOLIC PANEL
Anion gap: 13 (ref 5–15)
BUN: 28 mg/dL — ABNORMAL HIGH (ref 8–23)
CO2: 30 mmol/L (ref 22–32)
Calcium: 8 mg/dL — ABNORMAL LOW (ref 8.9–10.3)
Chloride: 94 mmol/L — ABNORMAL LOW (ref 98–111)
Creatinine, Ser: 3.77 mg/dL — ABNORMAL HIGH (ref 0.44–1.00)
GFR calc Af Amer: 13 mL/min — ABNORMAL LOW (ref >60)
GFR calc non Af Amer: 11 mL/min — ABNORMAL LOW (ref >60)
Glucose, Bld: 217 mg/dL — ABNORMAL HIGH (ref 70–99)
Potassium: 3.9 mmol/L (ref 3.5–5.1)
Sodium: 137 mmol/L (ref 135–145)

## 2020-03-31 LAB — THERAPEUTIC PLASMA EXCHANGE (BLOOD BANK)
Plasma Exchange: 3000
Plasma volume needed: 3000
Unit division: 0
Unit division: 0
Unit division: 0
Unit division: 0
Unit division: 0
Unit division: 0
Unit division: 0
Unit division: 0

## 2020-03-31 LAB — RENAL FUNCTION PANEL
Albumin: 2.5 g/dL — ABNORMAL LOW (ref 3.5–5.0)
Anion gap: 9 (ref 5–15)
BUN: 18 mg/dL (ref 8–23)
CO2: 34 mmol/L — ABNORMAL HIGH (ref 22–32)
Calcium: 7.9 mg/dL — ABNORMAL LOW (ref 8.9–10.3)
Chloride: 95 mmol/L — ABNORMAL LOW (ref 98–111)
Creatinine, Ser: 2.68 mg/dL — ABNORMAL HIGH (ref 0.44–1.00)
GFR calc Af Amer: 20 mL/min — ABNORMAL LOW (ref >60)
GFR calc non Af Amer: 17 mL/min — ABNORMAL LOW (ref >60)
Glucose, Bld: 93 mg/dL (ref 70–99)
Phosphorus: 3 mg/dL (ref 2.5–4.6)
Potassium: 3.9 mmol/L (ref 3.5–5.1)
Sodium: 138 mmol/L (ref 135–145)

## 2020-03-31 LAB — CBC
HCT: 18.9 % — ABNORMAL LOW (ref 36.0–46.0)
Hemoglobin: 6 g/dL — CL (ref 12.0–15.0)
MCH: 28 pg (ref 26.0–34.0)
MCHC: 31.7 g/dL (ref 30.0–36.0)
MCV: 88.3 fL (ref 80.0–100.0)
Platelets: 123 10*3/uL — ABNORMAL LOW (ref 150–400)
RBC: 2.14 MIL/uL — ABNORMAL LOW (ref 3.87–5.11)
RDW: 15.9 % — ABNORMAL HIGH (ref 11.5–15.5)
WBC: 8.5 10*3/uL (ref 4.0–10.5)
nRBC: 0 % (ref 0.0–0.2)

## 2020-03-31 LAB — PREPARE RBC (CROSSMATCH)

## 2020-03-31 MED ORDER — ACD FORMULA A 0.73-2.45-2.2 GM/100ML VI SOLN
Status: AC
  Filled 2020-03-31: qty 500

## 2020-03-31 MED ORDER — CALCIUM GLUCONATE-NACL 2-0.675 GM/100ML-% IV SOLN
2.0000 g | Freq: Once | INTRAVENOUS | Status: AC
  Administered 2020-03-31: 2000 mg via INTRAVENOUS
  Filled 2020-03-31: qty 100

## 2020-03-31 MED ORDER — ACD FORMULA A 0.73-2.45-2.2 GM/100ML VI SOLN
1000.0000 mL | Status: DC
  Filled 2020-03-31: qty 1000

## 2020-03-31 MED ORDER — DIPHENHYDRAMINE HCL 25 MG PO CAPS
ORAL_CAPSULE | ORAL | Status: AC
  Administered 2020-03-31: 25 mg
  Filled 2020-03-31: qty 1

## 2020-03-31 MED ORDER — CALCIUM CARBONATE ANTACID 500 MG PO CHEW: 2.0000 | CHEWABLE_TABLET | ORAL | Status: AC

## 2020-03-31 MED ORDER — ANTICOAGULANT SODIUM CITRATE 4% (200MG/5ML) IV SOLN
5.0000 mL | Freq: Once | Status: AC
  Administered 2020-03-31: 5 mL
  Filled 2020-03-31: qty 5

## 2020-03-31 MED ORDER — ACETAMINOPHEN 325 MG PO TABS
650.0000 mg | ORAL_TABLET | ORAL | Status: DC | PRN
  Administered 2020-03-31: 650 mg via ORAL

## 2020-03-31 MED ORDER — ACETAMINOPHEN 325 MG PO TABS
ORAL_TABLET | ORAL | Status: AC
  Filled 2020-03-31: qty 2

## 2020-03-31 MED ORDER — CALCIUM CARBONATE ANTACID 500 MG PO CHEW
CHEWABLE_TABLET | ORAL | Status: AC
  Administered 2020-03-31: 400 mg
  Filled 2020-03-31: qty 2

## 2020-03-31 MED ORDER — DIPHENHYDRAMINE HCL 25 MG PO CAPS: 25.0000 mg | ORAL_CAPSULE | Freq: Four times a day (QID) | ORAL | Status: DC | PRN

## 2020-03-31 MED ORDER — SODIUM CHLORIDE 0.9% IV SOLUTION: Freq: Once | INTRAVENOUS | Status: AC

## 2020-03-31 NOTE — Plan of Care (Signed)
Patient A&O x4. VSS. Received 1 unit of PRBCs and went to Dialysis for plasmapheresis. Free from falls. Pt turns self in bed. Ambulates standby assist. Pt tolerating fluids and meals.  Pt voiding adequately during shift.DVT prophylactic: heparin. No c/o of pain. Bed wheels locked. Phone and call bell within reach. Pt is resting, no distress. POC reviewed.    Problem: Education: Goal: Knowledge of General Education information will improve Description: Including pain rating scale, medication(s)/side effects and non-pharmacologic comfort measures Outcome: Progressing   Problem: Clinical Measurements: Goal: Ability to maintain clinical measurements within normal limits will improve Outcome: Progressing Goal: Diagnostic test results will improve Outcome: Progressing   Problem: Activity: Goal: Risk for activity intolerance will decrease Outcome: Progressing   Problem: Nutrition: Goal: Adequate nutrition will be maintained Outcome: Progressing   Problem: Elimination: Goal: Will not experience complications related to bowel motility Outcome: Progressing Goal: Will not experience complications related to urinary retention Outcome: Progressing

## 2020-03-31 NOTE — Progress Notes (Signed)
PROGRESS NOTE        PATIENT DETAILS Name: Jasmine White Age: 74 y.o. Sex: female Date of Birth: Aug 11, 1946 Admit Date: 03/17/2020 Admitting Physician Clance Boll, MD QQV:[id number], [name], FNP  Brief Narrative: Patient is a 74 y.o. female with history of HTN, hypothyroidism, HLD, recently diagnosed by PCP with renal dysfunction, history of COVID-19 infection in November 2020-since then been having persistent fatigue-who presented with persistent nausea/vomiting-further evaluation revealed severe hyponatremia with a sodium of 109, worsening renal function with a creatinine of 7.12- she was admitted-provided supportive care-subsequently nephrology was consulted-she underwent renal biopsy-which showed 100% crescents and significant amount of ATN.  She was subsequently started on plasmapheresis, Solu-Medrol/Cytoxan and also on hemodialysis.  See below for further details.  Significant events: 6/26>> admit to [facility] for severe hyponatremia, AKI  Significant studies: 6/26>> CT abdomen pelvis: Large volume of inspissated fecal material throughout the colon, moderately distended urinary bladder, cholelithiasis 6/27>> RUQ ultrasound: Cholelithiasis without evidence of cholecystitis 7/1>> VQ scan: Pulmonary embolism absent 7/1>> bilateral lower extremity Doppler: No DVT 7/3>> CT chest: Bilateral areas of mosaic groundglass and consolidative opacity-are nonspecific-in the setting of anti-GBM antibodies very early/developing alveolar hemorrhage could present with similar appearance.  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  Remains on steroids, Cytoxan and Bactrim MWF for PCP prophylaxis.  Due to neurological side effects/hyperactivity-steroids doses being stable tapered.  Continue to follow closely for renal recovery.  Alveolar hemorrhage: No hemoptysis-not hypoxic-see CT chest above-started on plasmapheresis daily plasmapheresis x2 weeks (9/14 days).    Acute encephalopathy: Secondary to steroids-has improved-steroids being slowly tapered down-seems to be relatively stable on 40 mg of prednisone.  Continue to follow daily.  Hyponatremia: Secondary to hypovolemia/HCTZ use-and AKI-resolved with supportive care.  Normocytic anemia: Probably related to CKD-has required 1 unit of PRBC so far.  SPEP-without M spike.  Hemoglobin dropped down to 6.0 today-no signs of overt GI bleeding-being transfused 1 unit of PRBC.  Repeat CBC tomorrow.    Constipation: Resolved.  Hypothyroidism: Continue Synthroid-TSH stable.  Deconditioning: Rehab services following  Diet: Diet Order            Diet renal with fluid restriction Fluid restriction: 1200 mL Fluid; Room service appropriate? Yes; Fluid consistency: Thin  Diet effective now                  Code Status: Full code   Family Communication: Daughter [name] over the phone on 7/9-we will update over the next few days  Disposition Plan: Status is: Inpatient  Remains inpatient appropriate because:Inpatient level of care appropriate due to severity of illness  Dispo: The patient is from: Home              Anticipated d/c is to: Home              Anticipated d/c date is: > 3 days  Patient currently is not medically stable to d/c.  Barriers to Discharge: Requires  daily plasmapheresis x2 weeks total-before discharge with outpatient HD.  Antimicrobial agents: Anti-infectives (From admission, onward)   Start     Dose/Rate Route Frequency Ordered Stop   03/26/20 1000  sulfamethoxazole-trimethoprim (BACTRIM) 400-80 MG per tablet 1 tablet     Discontinue     1 tablet Oral Every M-W-F 03/25/20 0954     03/25/20 2200  sulfamethoxazole-trimethoprim (BACTRIM) 400-80 MG per tablet 1 tablet  Status:  Discontinued        1 tablet Oral Daily at bedtime 03/25/20 0854 03/25/20 0921   03/20/20 1319  ceFAZolin (ANCEF) 2-4 GM/100ML-% IVPB       Note to Pharmacy: Arlean Hopping   : cabinet override      03/20/20 1319 03/21/20 0129   03/20/20 1100  ceFAZolin (ANCEF) IVPB 2g/100 mL premix        2 g 200 mL/hr over 30 Minutes Intravenous On call 03/20/20 1040 03/20/20 1506   03/19/20 0000  cefTRIAXone (ROCEPHIN) 1 g in sodium chloride 0.9 % 100 mL IVPB       Note to Pharmacy: 1st dose now   1 g 200 mL/hr over 30 Minutes Intravenous Every 24 hours 03/18/20 0127 03/21/20 0008   03/18/20 0145  cefTRIAXone (ROCEPHIN) 1 g in sodium chloride 0.9 % 100 mL IVPB       Note to Pharmacy: 1st dose now   1 g 200 mL/hr over 30 Minutes Intravenous  Once 03/18/20 0135 03/18/20 0252       Time spent: 25- minutes-Greater than 50% of this time was spent in counseling, explanation of diagnosis, planning of further management, and coordination of care.  MEDICATIONS: Scheduled Meds: . sodium chloride   Intravenous Once  . acetaminophen      . amLODipine  10 mg Oral Daily  . calcium carbonate  2 tablet Oral Q3H  . Chlorhexidine Gluconate Cloth  6 each Topical Q0600  . cholecalciferol  1,000 Units Oral Daily  . cyclophosphamide  50 mg Oral q1800  . darbepoetin (ARANESP) injection - NON-DIALYSIS  40 mcg Subcutaneous Q Thu-1800  . docusate sodium  200 mg Oral BID  . heparin injection (subcutaneous)  5,000 Units Subcutaneous Q8H  . hydrALAZINE  100 mg Oral TID  . levothyroxine  88  mcg Oral Q0600  . metoprolol tartrate  100 mg Oral BID  . pantoprazole  40 mg Oral Daily  . polyethylene glycol  17 g Oral BID  . predniSONE  40 mg Oral Q breakfast  . sulfamethoxazole-trimethoprim  1 tablet Oral Q M,W,F   Continuous Infusions: . anticoagulant sodium citrate    . calcium gluconate 2,000 mg (03/31/20 1338)  . citrate dextrose    . citrate dextrose    . citrate dextrose     PRN Meds:.acetaminophen, acetaminophen, albuterol, diphenhydrAMINE, [DISCONTINUED] ondansetron **OR** ondansetron (ZOFRAN) IV, technetium albumin aggregated   PHYSICAL EXAM: Vital signs: Vitals:   03/31/20 1315 03/31/20 1320 03/31/20 1335 03/31/20 1357  BP: 128/67 125/62 (!) 142/67 (!) 148/73  Pulse:      Resp:  20 (!) 22 19  Temp: 99.2 F (37.3 C) 99 F (37.2 C) 98.9 F (37.2 C) 98.9 F (37.2 C)  TempSrc: Oral Oral Oral Oral  SpO2:      Weight:      Height:       Filed Weights   03/28/20 1113 03/30/20 0725 03/30/20 1145  Weight: 61.5 kg 65.4 kg 64.2  kg   Body mass index is 22.84 kg/m.   Gen Exam:Alert awake-not in any distress HEENT:atraumatic, normocephalic Chest: B/L clear to auscultation anteriorly CVS:S1S2 regular Abdomen:soft non tender, non distended Extremities:no edema Neurology: Non focal Skin: no rash  I have personally reviewed following labs and imaging studies  LABORATORY DATA: CBC: Recent Labs  Lab 03/26/20 0605 03/27/20 1235 03/28/20 0757 03/30/20 0753 03/31/20 0315  WBC 18.8* 18.0* 18.0* 15.7* 8.5  HGB 8.9* 8.8* 8.1* 7.1* 6.0*  HCT 27.7* 27.7* 25.7* 22.3* 18.9*  MCV 86.0 86.6 88.9 89.6 88.3  PLT 161 138* 145* 146* 123*    Basic Metabolic Panel: Recent Labs  Lab 03/27/20 1125 03/28/20 0757 03/29/20 1155 03/30/20 0752 03/31/20 0315  NA 137 136 135 133* 138  K 3.0* 3.2* 3.5 3.3* 3.9  CL 93* 92* 95* 90* 95*  CO2 28 28 28 31  34*  GLUCOSE 146* 205* 137* 95 93  BUN 33* 49* 27* 38* 18  CREATININE 3.73* 5.00* 3.61* 4.50* 2.68*  CALCIUM 8.2*  8.6* 8.4* 8.8* 7.9*  PHOS 2.7 3.5 2.5 4.1 3.0    GFR: Estimated Creatinine Clearance: 17.2 mL/min (A) (by C-G formula based on SCr of 2.68 mg/dL (H)).  Liver Function Tests: Recent Labs  Lab 03/27/20 1125 03/28/20 0757 03/29/20 1155 03/30/20 0752 03/31/20 0315  ALBUMIN 2.8* 2.9* 2.8* 2.9* 2.5*   No results for input(s): LIPASE, AMYLASE in the last 168 hours. No results for input(s): AMMONIA in the last 168 hours.  Coagulation Profile: No results for input(s): INR, PROTIME in the last 168 hours.  Cardiac Enzymes: No results for input(s): CKTOTAL, CKMB, CKMBINDEX, TROPONINI in the last 168 hours.  BNP (last 3 results) No results for input(s): PROBNP in the last 8760 hours.  Lipid Profile: No results for input(s): CHOL, HDL, LDLCALC, TRIG, CHOLHDL, LDLDIRECT in the last 72 hours.  Thyroid Function Tests: No results for input(s): TSH, T4TOTAL, FREET4, T3FREE, THYROIDAB in the last 72 hours.  Anemia Panel: No results for input(s): VITAMINB12, FOLATE, FERRITIN, TIBC, IRON, RETICCTPCT in the last 72 hours.  Urine analysis:    Component Value Date/Time   COLORURINE AMBER (A) 03/18/2020 2143   APPEARANCEUR HAZY (A) 03/18/2020 2143   APPEARANCEUR Clear 03/03/2019 0920   LABSPEC 1.008 03/18/2020 2143   PHURINE 6.0 03/18/2020 2143   GLUCOSEU NEGATIVE 03/18/2020 2143   HGBUR LARGE (A) 03/18/2020 2143   BILIRUBINUR NEGATIVE 03/18/2020 2143   BILIRUBINUR Negative 03/03/2019 0920   KETONESUR NEGATIVE 03/18/2020 2143   PROTEINUR 100 (A) 03/18/2020 2143   UROBILINOGEN negative 12/21/2012 1509   NITRITE NEGATIVE 03/18/2020 2143   LEUKOCYTESUR SMALL (A) 03/18/2020 2143    Sepsis Labs: Lactic Acid, Venous No results found for: LATICACIDVEN  MICROBIOLOGY: No results found for this or any previous visit (from the past 240 hour(s)).  RADIOLOGY STUDIES/RESULTS: No results found.   LOS: 14 days   Oren Binet, MD  [facility]    To contact the attending  provider between 7A-7P or the covering provider during after hours 7P-7A, please log into the web site [email or web address] and access using universal [place] password for that web site. If you do not have the password, please call the hospital operator.  03/31/2020, 2:11 PM

## 2020-03-31 NOTE — Progress Notes (Signed)
CRITICAL VALUE ALERT  Critical Value: Hgb 6.0  Date & Time Notied:  03/31/20 0517   Provider Notified: T. Opyd   Orders Received/Actions taken: transfuse 1 unit

## 2020-03-31 NOTE — Progress Notes (Signed)
Patient ID: Jasmine White, female   DOB: 21-Apr-1946, 74 y.o.   MRN: [id number] S: Feels very weak this morning.  Had several BM but no UOP. O:BP (!) 115/52 (BP Location: Left Arm)    Pulse 90    Temp (!) 97.5 F (36.4 C) (Oral)    Resp 20    Ht 5\' 6"  (1.676 m)    Wt 64.2 kg    SpO2 96%    BMI 22.84 kg/m   Intake/Output Summary (Last 24 hours) at 03/31/2020 1023 Last data filed at 03/31/2020 0900 Gross per 24 hour  Intake 200 ml  Output 1169 ml  Net -969 ml   Intake/Output: I/O last 3 completed shifts: In: 120 [P.O.:120] Out: 1169 [Other:1169]  Intake/Output this shift:  Total I/O In: 200 [P.O.:200] Out: 0  Weight change:  Gen:NAD CVS: no rub Resp: cta Abd: benign Ext: no edema  Recent Labs  Lab 03/25/20 0159 03/25/20 0159 03/26/20 0605 03/26/20 1516 03/27/20 1125 03/28/20 0757 03/29/20 1155 03/30/20 0752 03/31/20 0315  NA 135   < > 136 136 137 136 135 133* 138  K 3.7   < > 3.4* 4.0 3.0* 3.2* 3.5 3.3* 3.9  CL 95*   < > 95* 98 93* 92* 95* 90* 95*  CO2 29   < > 26 28 28 28 28 31  34*  GLUCOSE 112*   < > 95 109* 146* 205* 137* 95 93  BUN 29*   < > 54* 14 33* 49* 27* 38* 18  CREATININE 2.91*   < > 5.01* 2.10* 3.73* 5.00* 3.61* 4.50* 2.68*  ALBUMIN 3.0*  --  2.8*  --  2.8* 2.9* 2.8* 2.9* 2.5*  CALCIUM 8.7*   < > 8.5* 8.0* 8.2* 8.6* 8.4* 8.8* 7.9*  PHOS 2.7  --  3.1  --  2.7 3.5 2.5 4.1 3.0   < > = values in this interval not displayed.   Liver Function Tests: Recent Labs  Lab 03/29/20 1155 03/30/20 0752 03/31/20 0315  ALBUMIN 2.8* 2.9* 2.5*   No results for input(s): LIPASE, AMYLASE in the last 168 hours. No results for input(s): AMMONIA in the last 168 hours. CBC: Recent Labs  Lab 03/26/20 0605 03/26/20 0605 03/27/20 1235 03/27/20 1235 03/28/20 0757 03/30/20 0753 03/31/20 0315  WBC 18.8*   < > 18.0*   < > 18.0* 15.7* 8.5  HGB 8.9*   < > 8.8*   < > 8.1* 7.1* 6.0*  HCT 27.7*   < > 27.7*   < > 25.7* 22.3* 18.9*  MCV 86.0  --  86.6  --  88.9 89.6 88.3   PLT 161   < > 138*   < > 145* 146* 123*   < > = values in this interval not displayed.   Cardiac Enzymes: No results for input(s): CKTOTAL, CKMB, CKMBINDEX, TROPONINI in the last 168 hours. CBG: No results for input(s): GLUCAP in the last 168 hours.  Iron Studies: No results for input(s): IRON, TIBC, TRANSFERRIN, FERRITIN in the last 72 hours. Studies/Results: No results found.  sodium chloride   Intravenous Once   amLODipine  10 mg Oral Daily   calcium carbonate  2 tablet Oral Q3H   Chlorhexidine Gluconate Cloth  6 each Topical Q0600   cholecalciferol  1,000 Units Oral Daily   cyclophosphamide  50 mg Oral q1800   darbepoetin (ARANESP) injection - NON-DIALYSIS  40 mcg Subcutaneous Q Thu-1800   docusate sodium  200 mg Oral BID   heparin  injection (subcutaneous)  5,000 Units Subcutaneous Q8H   hydrALAZINE  100 mg Oral TID   levothyroxine  88 mcg Oral Q0600   metoprolol tartrate  100 mg Oral BID   pantoprazole  40 mg Oral Daily   polyethylene glycol  17 g Oral BID   predniSONE  40 mg Oral Q breakfast   sulfamethoxazole-trimethoprim  1 tablet Oral Q M,W,F    BMET    Component Value Date/Time   NA 138 03/31/2020 0315   NA 128 (L) 03/01/2020 0927   K 3.9 03/31/2020 0315   CL 95 (L) 03/31/2020 0315   CO2 34 (H) 03/31/2020 0315   GLUCOSE 93 03/31/2020 0315   BUN 18 03/31/2020 0315   BUN 67 (H) 03/01/2020 0927   CREATININE 2.68 (H) 03/31/2020 0315   CREATININE 0.84 12/21/2012 1552   CALCIUM 7.9 (L) 03/31/2020 0315   GFRNONAA 17 (L) 03/31/2020 0315   GFRNONAA 72 12/21/2012 1552   GFRAA 20 (L) 03/31/2020 0315   GFRAA 83 12/21/2012 1552   CBC    Component Value Date/Time   WBC 8.5 03/31/2020 0315   RBC 2.14 (L) 03/31/2020 0315   HGB 6.0 (LL) 03/31/2020 0315   HGB 9.7 (L) 03/01/2020 0927   HCT 18.9 (L) 03/31/2020 0315   HCT 28.8 (L) 03/01/2020 0927   PLT 123 (L) 03/31/2020 0315   PLT 397 03/01/2020 0927   MCV 88.3 03/31/2020 0315   MCV 79 03/01/2020  0927   MCH 28.0 03/31/2020 0315   MCHC 31.7 03/31/2020 0315   RDW 15.9 (H) 03/31/2020 0315   RDW 14.7 03/01/2020 0927   LYMPHSABS 0.3 (L) 03/22/2020 0103   LYMPHSABS 1.3 03/01/2020 0927   MONOABS 0.3 03/22/2020 0103   EOSABS 0.0 03/22/2020 0103   EOSABS 0.2 03/01/2020 0927   BASOSABS 0.0 03/22/2020 0103   BASOSABS 0.1 03/01/2020 0927   Assessment/Plan:  1. 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 Started on daily plasmapheresis and IV solumedrol/po cytoxan. HD x2. 1. Biopsy with 100% crescents and significant amount of ATN. 2. [place] continue with HD on MWF schedule for now. 3. For pheresis with FFP due to possible alveolar hemorrhagedaily. 4. Continue cytoxan 50 mg daily 5. Decrease prednisone to 40 mg daily and try to taper more quickly given agitation 6. Today for8th out of 14 sessions ofpheresis 7. Bactrim MWF for PCP prophylaxis 8. Continue to follow for renal recovery, however doubtful given extensive crescents on biopsy.  Will need AVF/AVG placement next week prior to discharge if not sign of renal recovery. 9. Awaiting outpatient HD at [facility] as she would like to stay with our practice.  Continue with current schedule for now. 2. Hyponatremia- due to hypovolemia and concomitant HCTZ use and AKI. follow after UF. 3. HTN- follow with HD and UF 4. Vascular access- RIJ TDC placed 03/20/20 by IR.  Will consult VVS for AVF or AVG placement prior to discharge given poor likelihood of renal recovery given biopsy results.   [name], MD [employer] [phone]

## 2020-03-31 NOTE — Progress Notes (Signed)
Patient off unit, transported to dialysis.

## 2020-04-01 ENCOUNTER — Inpatient Hospital Stay (HOSPITAL_COMMUNITY): Payer: Medicare PPO

## 2020-04-01 DIAGNOSIS — Z01810 Encounter for preprocedural cardiovascular examination: Secondary | ICD-10-CM

## 2020-04-01 DIAGNOSIS — N185 Chronic kidney disease, stage 5: Secondary | ICD-10-CM

## 2020-04-01 LAB — THERAPEUTIC PLASMA EXCHANGE (BLOOD BANK)
Plasma volume needed: 3000
Unit division: 0
Unit division: 0
Unit division: 0
Unit division: 0
Unit division: 0
Unit division: 0
Unit division: 0
Unit division: 0
Unit division: 0
Unit division: 0

## 2020-04-01 LAB — RENAL FUNCTION PANEL
Albumin: 2.8 g/dL — ABNORMAL LOW (ref 3.5–5.0)
Anion gap: 12 (ref 5–15)
BUN: 38 mg/dL — ABNORMAL HIGH (ref 8–23)
CO2: 33 mmol/L — ABNORMAL HIGH (ref 22–32)
Calcium: 8.5 mg/dL — ABNORMAL LOW (ref 8.9–10.3)
Chloride: 93 mmol/L — ABNORMAL LOW (ref 98–111)
Creatinine, Ser: 4.87 mg/dL — ABNORMAL HIGH (ref 0.44–1.00)
GFR calc Af Amer: 9 mL/min — ABNORMAL LOW (ref >60)
GFR calc non Af Amer: 8 mL/min — ABNORMAL LOW (ref >60)
Glucose, Bld: 96 mg/dL (ref 70–99)
Phosphorus: 4.2 mg/dL (ref 2.5–4.6)
Potassium: 3.7 mmol/L (ref 3.5–5.1)
Sodium: 138 mmol/L (ref 135–145)

## 2020-04-01 LAB — CBC
HCT: 23.2 % — ABNORMAL LOW (ref 36.0–46.0)
Hemoglobin: 7.4 g/dL — ABNORMAL LOW (ref 12.0–15.0)
MCH: 27.9 pg (ref 26.0–34.0)
MCHC: 31.9 g/dL (ref 30.0–36.0)
MCV: 87.5 fL (ref 80.0–100.0)
Platelets: 124 10*3/uL — ABNORMAL LOW (ref 150–400)
RBC: 2.65 MIL/uL — ABNORMAL LOW (ref 3.87–5.11)
RDW: 15.9 % — ABNORMAL HIGH (ref 11.5–15.5)
WBC: 9.1 10*3/uL (ref 4.0–10.5)
nRBC: 0 % (ref 0.0–0.2)

## 2020-04-01 LAB — SURGICAL PATHOLOGY

## 2020-04-01 MED ORDER — DARBEPOETIN ALFA 40 MCG/0.4ML IJ SOSY: 40.0000 ug | PREFILLED_SYRINGE | INTRAMUSCULAR | Status: DC

## 2020-04-01 MED ORDER — CEFAZOLIN SODIUM-DEXTROSE 1-4 GM/50ML-% IV SOLN
1.0000 g | INTRAVENOUS | Status: AC
  Administered 2020-04-02: 1 g via INTRAVENOUS
  Filled 2020-04-01: qty 50

## 2020-04-01 NOTE — Progress Notes (Signed)
Patient A&O x4. VSS.  Free from falls. Pt turns self in bed. Ambulates standby assist. Patient sitting up in chair. Pt tolerating fluids and meals.  Pt voiding adequately during shift.DVT prophylactic: heparin. No c/o of pain. Bed wheels locked. Phone and call bell within reach. Pt is resting, no distress. POC reviewed with patient and family.

## 2020-04-01 NOTE — Progress Notes (Signed)
Patient ID: Jasmine White, female   DOB: 06-04-1946, 74 y.o.   MRN: [id number] S:Feeling well, but unfortunately no UOP. O:BP (!) 143/73 (BP Location: Left Arm)    Pulse 89    Temp 98.2 F (36.8 C) (Oral)    Resp (!) 23    Ht 5\' 6"  (1.676 m)    Wt 64.2 kg    SpO2 95%    BMI 22.84 kg/m   Intake/Output Summary (Last 24 hours) at 04/01/2020 1128 Last data filed at 04/01/2020 0900 Gross per 24 hour  Intake 800 ml  Output 0 ml  Net 800 ml   Intake/Output: I/O last 3 completed shifts: In: 1115 [P.O.:800; Blood:315] Out: 0   Intake/Output this shift:  Total I/O In: 200 [P.O.:200] Out: -  Weight change:  Gen: NAD CVS: no rub Resp: bibasilar rales Abd:+BS, soft, nt/nd Ext: no edema  Recent Labs  Lab 03/26/20 0605 03/26/20 1516 03/27/20 1125 03/28/20 0757 03/29/20 1155 03/30/20 0752 03/31/20 0315 03/31/20 1431 04/01/20 0715  NA 136   < > 137 136 135 133* 138 137 138  K 3.4*   < > 3.0* 3.2* 3.5 3.3* 3.9 3.9 3.7  CL 95*   < > 93* 92* 95* 90* 95* 94* 93*  CO2 26   < > 28 28 28 31  34* 30 33*  GLUCOSE 95   < > 146* 205* 137* 95 93 217* 96  BUN 54*   < > 33* 49* 27* 38* 18 28* 38*  CREATININE 5.01*   < > 3.73* 5.00* 3.61* 4.50* 2.68* 3.77* 4.87*  ALBUMIN 2.8*  --  2.8* 2.9* 2.8* 2.9* 2.5*  --  2.8*  CALCIUM 8.5*   < > 8.2* 8.6* 8.4* 8.8* 7.9* 8.0* 8.5*  PHOS 3.1  --  2.7 3.5 2.5 4.1 3.0  --  4.2   < > = values in this interval not displayed.   Liver Function Tests: Recent Labs  Lab 03/30/20 0752 03/31/20 0315 04/01/20 0715  ALBUMIN 2.9* 2.5* 2.8*   No results for input(s): LIPASE, AMYLASE in the last 168 hours. No results for input(s): AMMONIA in the last 168 hours. CBC: Recent Labs  Lab 03/27/20 1235 03/27/20 1235 03/28/20 0757 03/28/20 0757 03/30/20 0753 03/31/20 0315 04/01/20 0715  WBC 18.0*   < > 18.0*   < > 15.7* 8.5 9.1  HGB 8.8*   < > 8.1*   < > 7.1* 6.0* 7.4*  HCT 27.7*   < > 25.7*   < > 22.3* 18.9* 23.2*  MCV 86.6  --  88.9  --  89.6 88.3 87.5  PLT  138*   < > 145*   < > 146* 123* 124*   < > = values in this interval not displayed.   Cardiac Enzymes: No results for input(s): CKTOTAL, CKMB, CKMBINDEX, TROPONINI in the last 168 hours. CBG: No results for input(s): GLUCAP in the last 168 hours.  Iron Studies: No results for input(s): IRON, TIBC, TRANSFERRIN, FERRITIN in the last 72 hours. Studies/Results: No results found.  sodium chloride   Intravenous Once   amLODipine  10 mg Oral Daily   Chlorhexidine Gluconate Cloth  6 each Topical Q0600   cholecalciferol  1,000 Units Oral Daily   cyclophosphamide  50 mg Oral q1800   darbepoetin (ARANESP) injection - NON-DIALYSIS  40 mcg Subcutaneous Q Thu-1800   docusate sodium  200 mg Oral BID   heparin injection (subcutaneous)  5,000 Units Subcutaneous Q8H   hydrALAZINE  100  mg Oral TID   levothyroxine  88 mcg Oral Q0600   metoprolol tartrate  100 mg Oral BID   pantoprazole  40 mg Oral Daily   polyethylene glycol  17 g Oral BID   predniSONE  40 mg Oral Q breakfast   sulfamethoxazole-trimethoprim  1 tablet Oral Q M,W,F    BMET    Component Value Date/Time   NA 138 04/01/2020 0715   NA 128 (L) 03/01/2020 0927   K 3.7 04/01/2020 0715   CL 93 (L) 04/01/2020 0715   CO2 33 (H) 04/01/2020 0715   GLUCOSE 96 04/01/2020 0715   BUN 38 (H) 04/01/2020 0715   BUN 67 (H) 03/01/2020 0927   CREATININE 4.87 (H) 04/01/2020 0715   CREATININE 0.84 12/21/2012 1552   CALCIUM 8.5 (L) 04/01/2020 0715   GFRNONAA 8 (L) 04/01/2020 0715   GFRNONAA 72 12/21/2012 1552   GFRAA 9 (L) 04/01/2020 0715   GFRAA 83 12/21/2012 1552   CBC    Component Value Date/Time   WBC 9.1 04/01/2020 0715   RBC 2.65 (L) 04/01/2020 0715   HGB 7.4 (L) 04/01/2020 0715   HGB 9.7 (L) 03/01/2020 0927   HCT 23.2 (L) 04/01/2020 0715   HCT 28.8 (L) 03/01/2020 0927   PLT 124 (L) 04/01/2020 0715   PLT 397 03/01/2020 0927   MCV 87.5 04/01/2020 0715   MCV 79 03/01/2020 0927   MCH 27.9 04/01/2020 0715   MCHC 31.9  04/01/2020 0715   RDW 15.9 (H) 04/01/2020 0715   RDW 14.7 03/01/2020 0927   LYMPHSABS 0.3 (L) 03/22/2020 0103   LYMPHSABS 1.3 03/01/2020 0927   MONOABS 0.3 03/22/2020 0103   EOSABS 0.0 03/22/2020 0103   EOSABS 0.2 03/01/2020 0927   BASOSABS 0.0 03/22/2020 0103   BASOSABS 0.1 03/01/2020 0927    Assessment/Plan:  1. 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 Started on daily plasmapheresis and IV solumedrol/po cytoxan. HD x2. 1. Biopsy with 100% crescents and significant amount of ATN. 2. [place] continue with HD on MWF schedule for now. 3. For pheresis with FFP due to possible alveolar hemorrhagedaily. 4. Continue cytoxan 50 mg daily 5. Decrease prednisone to 40 mg daily and try to taper more quickly given agitation 6. She has completed9 out of 14 sessions ofpheresis 7. Bactrim MWF for PCP prophylaxis 8. Continue to follow for renal recovery, however doubtful given extensive crescents on biopsy. Will need AVF/AVG placement next week prior to discharge since no sign of renal recovery. 9. Awaiting outpatient HD at [facility] as she would like to stay with our practice.  Continue with current schedule for now. 2. Hyponatremia- due to hypovolemia and concomitant HCTZ use and AKI. follow after UF. 3. HTN- follow with HD and UF 4. Vascular access- RIJ TDC placed 03/20/20 by IR.  Will consult VVS for AVF or AVG placement prior to discharge given poor likelihood of renal recovery given biopsy results.  5. Disposition- will need outpatient HD spot, AVF/AVG, and completion of 14 plasmapheresis sessions prior to discharge.  [name], MD [employer] [phone]

## 2020-04-01 NOTE — Progress Notes (Signed)
OT Cancellation Note  Patient Details Name: KRYSTENA REITTER MRN: [id number] DOB: [birth year]   Cancelled Treatment:    Reason Eval/Treat Not Completed: Other (comment) pt received seated in chair, dressed and eating a biscuit with husband present. Pt politely declining OT session this afternoon. Offered ambulation or ADL participation with continuing to declining preferring to visit with her husband. Will check back as time allows for OT session.  Lanier Clam., COTA/L Acute Rehabilitation Services [phone] [place] 04/01/2020, 2:17 PM

## 2020-04-01 NOTE — Consult Note (Signed)
Vascular and Vein Specialist of [facility]  Patient name: Jasmine White MRN: [id number] DOB: 10-Jan-1946 Sex: female   REQUESTING PROVIDER:    Renal    REASON FOR CONSULT:    Dialysis access  HISTORY OF PRESENT ILLNESS:   Jasmine White is a 74 y.o. female, who I have been asked to evaluate for dialysis access.  She is right handed.  Renal biopsy shows 100% crescents and significant ATN.  She was started on plasmapheresis and Solu-Medrol/Cytoxan.  Patient has a history of hypertension.  She takes a statin for hypercholesterolemia.  PAST MEDICAL HISTORY    Past Medical History:  Diagnosis Date  . Arthritis   . Bruises easily   . Cancer ([place])    basal skin cancer  . Chronic back pain    HNP/stenosis and radiculopathy  . Hyperlipidemia    takes Pravastatin daily  . Hypertension    takes Hyzaar and toprol daily  . Hypothyroidism    takes Synthroid daily  . Insomnia    takes Elevil nightly  . Joint pain   . Joint swelling   . Low BP    past some sedation  . Nocturia   . PONV (postoperative nausea and vomiting)    with knee replacement b/p dropped  . Sinusitis    finished zpak yesterday  . Urinary frequency      FAMILY HISTORY   Family History  Problem Relation Age of Onset  . Hypertension Mother   . Heart disease Mother   . Stroke Father   . Heart disease Father   . Alcohol abuse Father   . Arthritis Brother   . Cancer Brother 22       prostate  . Arthritis Brother   . Colon cancer Neg Hx   . Colon polyps Neg Hx   . Stomach cancer Neg Hx   . Rectal cancer Neg Hx     SOCIAL HISTORY:   Social History   Socioeconomic History  . Marital status: Married    Spouse name: Ray  . Number of children: 2  . Years of education: Not on file  . Highest education level: Not on file  Occupational History  . Occupation: retired  Tobacco Use  . Smoking status: Never Smoker  . Smokeless tobacco: Never Used  Vaping Use    . Vaping Use: Never used  Substance and Sexual Activity  . Alcohol use: No    Alcohol/week: 0.0 standard drinks  . Drug use: No  . Sexual activity: Not Currently  Other Topics Concern  . Not on file  Social History Narrative  . Not on file   Social Determinants of Health   Financial Resource Strain:   . Difficulty of Paying Living Expenses:   Food Insecurity:   . Worried About Charity fundraiser in the Last Year:   . Arboriculturist in the Last Year:   Transportation Needs:   . Film/video editor (Medical):   Marland Kitchen Lack of Transportation (Non-Medical):   Physical Activity:   . Days of Exercise per Week:   . Minutes of Exercise per Session:   Stress:   . Feeling of Stress :   Social Connections:   . Frequency of Communication with Friends and Family:   . Frequency of Social Gatherings with Friends and Family:   . Attends Religious Services:   . Active Member of Clubs or Organizations:   . Attends Archivist Meetings:   Marland Kitchen Marital Status:  Intimate Partner Violence:   . Fear of Current or Ex-Partner:   . Emotionally Abused:   Marland Kitchen Physically Abused:   . Sexually Abused:     ALLERGIES:    Allergies  Allergen Reactions  . Hctz [Hydrochlorothiazide] Other (See Comments)    Hx of severe Hyponatremia while taking HCTZ - now contraindicated  . Percocet [Oxycodone-Acetaminophen] Nausea Only  . Ultram [Tramadol]     Makes her crazy and nauseated    CURRENT MEDICATIONS:    Current Facility-Administered Medications  Medication Dose Route Frequency Provider Last Rate Last Admin  . 0.9 %  sodium chloride infusion (Manually program via Guardrails IV Fluids)   Intravenous Once Claudia Desanctis, MD      . acetaminophen (TYLENOL) tablet 650 mg  650 mg Oral Q6H PRN Shela Leff, MD   650 mg at 04/01/20 0829  . albuterol (PROVENTIL) (2.5 MG/3ML) 0.083% nebulizer solution 2.5 mg  2.5 mg Nebulization Q2H PRN Myles Rosenthal A, MD   2.5 mg at 03/20/20 0305  .  amLODipine (NORVASC) tablet 10 mg  10 mg Oral Daily Thurnell Lose, MD   10 mg at 04/01/20 0831  . Chlorhexidine Gluconate Cloth 2 % PADS 6 each  6 each Topical Q0600 Claudia Desanctis, MD   6 each at 04/01/20 0528  . cholecalciferol (VITAMIN D3) tablet 1,000 Units  1,000 Units Oral Daily Claudia Desanctis, MD   1,000 Units at 04/01/20 0831  . cyclophosphamide (CYTOXAN) capsule 50 mg  50 mg Oral q1800 Claudia Desanctis, MD   50 mg at 03/31/20 1824  . [START ON 04/06/2020] Darbepoetin Alfa (ARANESP) injection 40 mcg  40 mcg Intravenous Q Fri-HD Wilson Singer I, [facility]      . docusate sodium (COLACE) capsule 200 mg  200 mg Oral BID Thurnell Lose, MD   200 mg at 04/01/20 7412  . heparin injection 5,000 Units  5,000 Units Subcutaneous Q8H Elgergawy, Silver Huguenin, MD   5,000 Units at 04/01/20 1532  . hydrALAZINE (APRESOLINE) tablet 100 mg  100 mg Oral TID Elgergawy, Silver Huguenin, MD   100 mg at 04/01/20 1535  . levothyroxine (SYNTHROID) tablet 88 mcg  88 mcg Oral Q0600 Clance Boll, MD   88 mcg at 04/01/20 0528  . metoprolol tartrate (LOPRESSOR) tablet 100 mg  100 mg Oral BID Claudia Desanctis, MD   100 mg at 04/01/20 8786  . ondansetron (ZOFRAN) injection 4 mg  4 mg Intravenous Q6H PRN Clance Boll, MD   4 mg at 03/19/20 1115  . pantoprazole (PROTONIX) EC tablet 40 mg  40 mg Oral Daily Thurnell Lose, MD   40 mg at 04/01/20 0832  . polyethylene glycol (MIRALAX / GLYCOLAX) packet 17 g  17 g Oral BID Thurnell Lose, MD   17 g at 04/01/20 0833  . predniSONE (DELTASONE) tablet 40 mg  40 mg Oral Q breakfast Jonetta Osgood, MD   40 mg at 04/01/20 0833  . sulfamethoxazole-trimethoprim (BACTRIM) 400-80 MG per tablet 1 tablet  1 tablet Oral Q M,W,F Claudia Desanctis, MD   1 tablet at 03/28/20 1702  . technetium albumin aggregated (MAA) injection solution 4.3 millicurie  4.3 millicurie Intravenous Once PRN Felipa Emory, MD        REVIEW OF SYSTEMS:   [X]  denotes positive finding, [ ]  denotes  negative finding Cardiac  Comments:  Chest pain or chest pressure:    Shortness of breath upon exertion:  Short of breath when lying flat:    Irregular heart rhythm:        Vascular    Pain in calf, thigh, or hip brought on by ambulation:    Pain in feet at night that wakes you up from your sleep:     Blood clot in your veins:    Leg swelling:         Pulmonary    Oxygen at home:    Productive cough:     Wheezing:         Neurologic    Sudden weakness in arms or legs:     Sudden numbness in arms or legs:     Sudden onset of difficulty speaking or slurred speech:    Temporary loss of vision in one eye:     Problems with dizziness:         Gastrointestinal    Blood in stool:      Vomited blood:         Genitourinary    Burning when urinating:     Blood in urine:        Psychiatric    Major depression:         Hematologic    Bleeding problems:    Problems with blood clotting too easily:        Skin    Rashes or ulcers:        Constitutional    Fever or chills:     PHYSICAL EXAM:   Vitals:   04/01/20 0725 04/01/20 0755 04/01/20 0758 04/01/20 1534  BP:  (!) 143/73 (!) 143/73 126/63  Pulse:   89   Resp: 20  (!) 23 19  Temp:   98.2 F (36.8 C) (!) 97.5 F (36.4 C)  TempSrc:   Oral Oral  SpO2:    96%  Weight:      Height:        GENERAL: The patient is a well-nourished female, in no acute distress. The vital signs are documented above. CARDIAC: There is a regular rate and rhythm.  VASCULAR: Palpable left radial and brachial pulse PULMONARY: Nonlabored respirations ABDOMEN: Soft and non-tender with normal pitched bowel sounds.  MUSCULOSKELETAL: There are no major deformities or cyanosis. NEUROLOGIC: No focal weakness or paresthesias are detected. SKIN: There are no ulcers or rashes noted. PSYCHIATRIC: The patient has a normal affect.  STUDIES:   I have reviewed her vein mapping with the following findings: Right Cephalic  Diameter (cm)Depth  (cm)      Findings         +-----------------+-------------+----------+------------------------------+   Prox upper arm    0.24    3655.00                    +-----------------+-------------+----------+------------------------------+   Mid upper arm    0.30     0.44                     +-----------------+-------------+----------+------------------------------+   Dist upper arm    0.22     0.54                     +-----------------+-------------+----------+------------------------------+   Antecubital fossa  0.43     0.28                     +-----------------+-------------+----------+------------------------------+   Prox forearm               not visualized  and  bandages/IV  +-----------------+-------------+----------+------------------------------+   Mid forearm     0.34     0.42                     +-----------------+-------------+----------+------------------------------+   Wrist        0.34     0.33                     +-----------------+-------------+----------+------------------------------+    +-----------------+-------------+----------+----------------+  Left Cephalic  Diameter (cm)Depth (cm)  Findings    +-----------------+-------------+----------+----------------+  Prox upper arm    0.25     0.71            +-----------------+-------------+----------+----------------+  Mid upper arm    0.25     0.49            +-----------------+-------------+----------+----------------+  Dist upper arm    0.24     0.71            +-----------------+-------------+----------+----------------+  Antecubital fossa  0.33     0.27  partial thrombus    +-----------------+-------------+----------+----------------+  Prox forearm     0.30     0.37    branching    +-----------------+-------------+----------+----------------+  Mid forearm     0.31     0.57            +-----------------+-------------+----------+----------------+  Wrist        0.35     0.37            +-----------------+-------------+----------+----------------+   +-----------------+-------------+----------+---------+  Left Basilic   Diameter (cm)Depth (cm)Findings   +-----------------+-------------+----------+---------+  Prox upper arm    0.43     0.97   origin   +-----------------+-------------+----------+---------+  Mid upper arm    0.44     0.90         +-----------------+-------------+----------+---------+  Dist upper arm    0.36     0.79  branching  +-----------------+-------------+----------+---------+  Antecubital fossa  0.43     0.90         +-----------------+-------------+----------+---------+  Prox forearm     0.32     0.52         +-----------------+-------------+----------+---------+  Mid forearm     0.46     2.00         +-----------------+-------------+----------+---------+  Wrist        0.40     0.40         +-----------------+-------------+----------+---------+   ASSESSMENT and PLAN   The patient is in need of dialysis access.  She is right handed.  We discussed proceeding with a right brachiocephalic fistula versus a 2 stage or 1 basilic vein fistula.  A graft will be placed if her veins turn out not to be adequate.  I discussed the details of the procedure including the need for future interventions the risk of nonmaturity, steal syndrome and access failure.  All of her questions were answered.  She will be n.p.o. after midnight.   Leia Alf, MD, FACS Vascular and  Vein Specialists of [facility] [phone] Pager [phone]

## 2020-04-01 NOTE — Progress Notes (Signed)
Patient off unit going to ultrasound.

## 2020-04-01 NOTE — H&P (View-Only) (Signed)
Vascular and Vein Specialist of [facility]  Patient name: Jasmine White MRN: [id number] DOB: 1946/06/12 Sex: female   REQUESTING PROVIDER:    Renal    REASON FOR CONSULT:    Dialysis access  HISTORY OF PRESENT ILLNESS:   Jasmine White is a 74 y.o. female, who I have been asked to evaluate for dialysis access.  She is right handed.  Renal biopsy shows 100% crescents and significant ATN.  She was started on plasmapheresis and Solu-Medrol/Cytoxan.  Patient has a history of hypertension.  She takes a statin for hypercholesterolemia.  PAST MEDICAL HISTORY    Past Medical History:  Diagnosis Date  . Arthritis   . Bruises easily   . Cancer ([place])    basal skin cancer  . Chronic back pain    HNP/stenosis and radiculopathy  . Hyperlipidemia    takes Pravastatin daily  . Hypertension    takes Hyzaar and toprol daily  . Hypothyroidism    takes Synthroid daily  . Insomnia    takes Elevil nightly  . Joint pain   . Joint swelling   . Low BP    past some sedation  . Nocturia   . PONV (postoperative nausea and vomiting)    with knee replacement b/p dropped  . Sinusitis    finished zpak yesterday  . Urinary frequency      FAMILY HISTORY   Family History  Problem Relation Age of Onset  . Hypertension Mother   . Heart disease Mother   . Stroke Father   . Heart disease Father   . Alcohol abuse Father   . Arthritis Brother   . Cancer Brother 69       prostate  . Arthritis Brother   . Colon cancer Neg Hx   . Colon polyps Neg Hx   . Stomach cancer Neg Hx   . Rectal cancer Neg Hx     SOCIAL HISTORY:   Social History   Socioeconomic History  . Marital status: Married    Spouse name: Ray  . Number of children: 2  . Years of education: Not on file  . Highest education level: Not on file  Occupational History  . Occupation: retired  Tobacco Use  . Smoking status: Never Smoker  . Smokeless tobacco: Never Used  Vaping Use    . Vaping Use: Never used  Substance and Sexual Activity  . Alcohol use: No    Alcohol/week: 0.0 standard drinks  . Drug use: No  . Sexual activity: Not Currently  Other Topics Concern  . Not on file  Social History Narrative  . Not on file   Social Determinants of Health   Financial Resource Strain:   . Difficulty of Paying Living Expenses:   Food Insecurity:   . Worried About Charity fundraiser in the Last Year:   . Arboriculturist in the Last Year:   Transportation Needs:   . Film/video editor (Medical):   Marland Kitchen Lack of Transportation (Non-Medical):   Physical Activity:   . Days of Exercise per Week:   . Minutes of Exercise per Session:   Stress:   . Feeling of Stress :   Social Connections:   . Frequency of Communication with Friends and Family:   . Frequency of Social Gatherings with Friends and Family:   . Attends Religious Services:   . Active Member of Clubs or Organizations:   . Attends Archivist Meetings:   Marland Kitchen Marital Status:  Intimate Partner Violence:   . Fear of Current or Ex-Partner:   . Emotionally Abused:   Marland Kitchen Physically Abused:   . Sexually Abused:     ALLERGIES:    Allergies  Allergen Reactions  . Hctz [Hydrochlorothiazide] Other (See Comments)    Hx of severe Hyponatremia while taking HCTZ - now contraindicated  . Percocet [Oxycodone-Acetaminophen] Nausea Only  . Ultram [Tramadol]     Makes her crazy and nauseated    CURRENT MEDICATIONS:    Current Facility-Administered Medications  Medication Dose Route Frequency Provider Last Rate Last Admin  . 0.9 %  sodium chloride infusion (Manually program via Guardrails IV Fluids)   Intravenous Once Claudia Desanctis, MD      . acetaminophen (TYLENOL) tablet 650 mg  650 mg Oral Q6H PRN Shela Leff, MD   650 mg at 04/01/20 0829  . albuterol (PROVENTIL) (2.5 MG/3ML) 0.083% nebulizer solution 2.5 mg  2.5 mg Nebulization Q2H PRN Myles Rosenthal A, MD   2.5 mg at 03/20/20 0305  .  amLODipine (NORVASC) tablet 10 mg  10 mg Oral Daily Thurnell Lose, MD   10 mg at 04/01/20 0831  . Chlorhexidine Gluconate Cloth 2 % PADS 6 each  6 each Topical Q0600 Claudia Desanctis, MD   6 each at 04/01/20 0528  . cholecalciferol (VITAMIN D3) tablet 1,000 Units  1,000 Units Oral Daily Claudia Desanctis, MD   1,000 Units at 04/01/20 0831  . cyclophosphamide (CYTOXAN) capsule 50 mg  50 mg Oral q1800 Claudia Desanctis, MD   50 mg at 03/31/20 1824  . [START ON 04/06/2020] Darbepoetin Alfa (ARANESP) injection 40 mcg  40 mcg Intravenous Q Fri-HD Wilson Singer I, [facility]      . docusate sodium (COLACE) capsule 200 mg  200 mg Oral BID Thurnell Lose, MD   200 mg at 04/01/20 0093  . heparin injection 5,000 Units  5,000 Units Subcutaneous Q8H Elgergawy, Silver Huguenin, MD   5,000 Units at 04/01/20 1532  . hydrALAZINE (APRESOLINE) tablet 100 mg  100 mg Oral TID Elgergawy, Silver Huguenin, MD   100 mg at 04/01/20 1535  . levothyroxine (SYNTHROID) tablet 88 mcg  88 mcg Oral Q0600 Clance Boll, MD   88 mcg at 04/01/20 0528  . metoprolol tartrate (LOPRESSOR) tablet 100 mg  100 mg Oral BID Claudia Desanctis, MD   100 mg at 04/01/20 8182  . ondansetron (ZOFRAN) injection 4 mg  4 mg Intravenous Q6H PRN Clance Boll, MD   4 mg at 03/19/20 1115  . pantoprazole (PROTONIX) EC tablet 40 mg  40 mg Oral Daily Thurnell Lose, MD   40 mg at 04/01/20 0832  . polyethylene glycol (MIRALAX / GLYCOLAX) packet 17 g  17 g Oral BID Thurnell Lose, MD   17 g at 04/01/20 0833  . predniSONE (DELTASONE) tablet 40 mg  40 mg Oral Q breakfast Jonetta Osgood, MD   40 mg at 04/01/20 0833  . sulfamethoxazole-trimethoprim (BACTRIM) 400-80 MG per tablet 1 tablet  1 tablet Oral Q M,W,F Claudia Desanctis, MD   1 tablet at 03/28/20 1702  . technetium albumin aggregated (MAA) injection solution 4.3 millicurie  4.3 millicurie Intravenous Once PRN Felipa Emory, MD        REVIEW OF SYSTEMS:   [X]  denotes positive finding, [ ]  denotes  negative finding Cardiac  Comments:  Chest pain or chest pressure:    Shortness of breath upon exertion:  Short of breath when lying flat:    Irregular heart rhythm:        Vascular    Pain in calf, thigh, or hip brought on by ambulation:    Pain in feet at night that wakes you up from your sleep:     Blood clot in your veins:    Leg swelling:         Pulmonary    Oxygen at home:    Productive cough:     Wheezing:         Neurologic    Sudden weakness in arms or legs:     Sudden numbness in arms or legs:     Sudden onset of difficulty speaking or slurred speech:    Temporary loss of vision in one eye:     Problems with dizziness:         Gastrointestinal    Blood in stool:      Vomited blood:         Genitourinary    Burning when urinating:     Blood in urine:        Psychiatric    Major depression:         Hematologic    Bleeding problems:    Problems with blood clotting too easily:        Skin    Rashes or ulcers:        Constitutional    Fever or chills:     PHYSICAL EXAM:   Vitals:   04/01/20 0725 04/01/20 0755 04/01/20 0758 04/01/20 1534  BP:  (!) 143/73 (!) 143/73 126/63  Pulse:   89   Resp: 20  (!) 23 19  Temp:   98.2 F (36.8 C) (!) 97.5 F (36.4 C)  TempSrc:   Oral Oral  SpO2:    96%  Weight:      Height:        GENERAL: The patient is a well-nourished female, in no acute distress. The vital signs are documented above. CARDIAC: There is a regular rate and rhythm.  VASCULAR: Palpable left radial and brachial pulse PULMONARY: Nonlabored respirations ABDOMEN: Soft and non-tender with normal pitched bowel sounds.  MUSCULOSKELETAL: There are no major deformities or cyanosis. NEUROLOGIC: No focal weakness or paresthesias are detected. SKIN: There are no ulcers or rashes noted. PSYCHIATRIC: The patient has a normal affect.  STUDIES:   I have reviewed her vein mapping with the following findings: Right Cephalic  Diameter (cm)Depth  (cm)      Findings         +-----------------+-------------+----------+------------------------------+   Prox upper arm    0.24    3655.00                    +-----------------+-------------+----------+------------------------------+   Mid upper arm    0.30     0.44                     +-----------------+-------------+----------+------------------------------+   Dist upper arm    0.22     0.54                     +-----------------+-------------+----------+------------------------------+   Antecubital fossa  0.43     0.28                     +-----------------+-------------+----------+------------------------------+   Prox forearm               not visualized  and  bandages/IV  +-----------------+-------------+----------+------------------------------+   Mid forearm     0.34     0.42                     +-----------------+-------------+----------+------------------------------+   Wrist        0.34     0.33                     +-----------------+-------------+----------+------------------------------+    +-----------------+-------------+----------+----------------+  Left Cephalic  Diameter (cm)Depth (cm)  Findings    +-----------------+-------------+----------+----------------+  Prox upper arm    0.25     0.71            +-----------------+-------------+----------+----------------+  Mid upper arm    0.25     0.49            +-----------------+-------------+----------+----------------+  Dist upper arm    0.24     0.71            +-----------------+-------------+----------+----------------+  Antecubital fossa  0.33     0.27  partial thrombus    +-----------------+-------------+----------+----------------+  Prox forearm     0.30     0.37    branching    +-----------------+-------------+----------+----------------+  Mid forearm     0.31     0.57            +-----------------+-------------+----------+----------------+  Wrist        0.35     0.37            +-----------------+-------------+----------+----------------+   +-----------------+-------------+----------+---------+  Left Basilic   Diameter (cm)Depth (cm)Findings   +-----------------+-------------+----------+---------+  Prox upper arm    0.43     0.97   origin   +-----------------+-------------+----------+---------+  Mid upper arm    0.44     0.90         +-----------------+-------------+----------+---------+  Dist upper arm    0.36     0.79  branching  +-----------------+-------------+----------+---------+  Antecubital fossa  0.43     0.90         +-----------------+-------------+----------+---------+  Prox forearm     0.32     0.52         +-----------------+-------------+----------+---------+  Mid forearm     0.46     2.00         +-----------------+-------------+----------+---------+  Wrist        0.40     0.40         +-----------------+-------------+----------+---------+   ASSESSMENT and PLAN   The patient is in need of dialysis access.  She is right handed.  We discussed proceeding with a right brachiocephalic fistula versus a 2 stage or 1 basilic vein fistula.  A graft will be placed if her veins turn out not to be adequate.  I discussed the details of the procedure including the need for future interventions the risk of nonmaturity, steal syndrome and access failure.  All of her questions were answered.  She will be n.p.o. after midnight.   Leia Alf, MD, FACS Vascular and  Vein Specialists of [facility] [phone] Pager [phone]

## 2020-04-01 NOTE — Progress Notes (Signed)
VASCULAR LAB    Bilateral upper extremity vein mapping completed.    Preliminary report:  See CV proc for preliminary results.  Adelie Croswell, RVT 04/01/2020, 12:42 PM

## 2020-04-01 NOTE — Progress Notes (Signed)
PROGRESS NOTE        PATIENT DETAILS Name: Jasmine White Age: 74 y.o. Sex: female Date of Birth: Jul 20, 1946 Admit Date: 03/17/2020 Admitting Physician Clance Boll, MD EXB:[id number], [name], FNP  Brief Narrative: Patient is a 74 y.o. female with history of HTN, hypothyroidism, HLD, recently diagnosed by PCP with renal dysfunction, history of COVID-19 infection in November 2020-since then been having persistent fatigue-who presented with persistent nausea/vomiting-further evaluation revealed severe hyponatremia with a sodium of 109, worsening renal function with a creatinine of 7.12- she was admitted-provided supportive care-subsequently nephrology was consulted-she underwent renal biopsy-which showed 100% crescents and significant amount of ATN.  She was subsequently started on plasmapheresis, Solu-Medrol/Cytoxan and also on hemodialysis.  See below for further details.  Significant events: 6/26>> admit to [facility] for severe hyponatremia, AKI  Significant studies: 6/26>> CT abdomen pelvis: Large volume of inspissated fecal material throughout the colon, moderately distended urinary bladder, cholelithiasis 6/27>> RUQ ultrasound: Cholelithiasis without evidence of cholecystitis 7/1>> VQ scan: Pulmonary embolism absent 7/1>> bilateral lower extremity Doppler: No DVT 7/3>> CT chest: Bilateral areas of mosaic groundglass and consolidative opacity-are nonspecific-in the setting of anti-GBM antibodies very early/developing alveolar hemorrhage could present with similar appearance.  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  Remains on steroids, Cytoxan and Bactrim MWF for PCP prophylaxis.  Due to neurological side effects/hyperactivity-steroids doses being stable tapered.  Continue to follow closely for renal recovery.  Alveolar hemorrhage: No hemoptysis-not hypoxic-see CT chest above-started on plasmapheresis daily plasmapheresis x2 weeks (on 9/14 days).    Acute encephalopathy: Secondary to steroids-has improved-steroids being slowly tapered down-seems to be relatively stable on 40 mg of prednisone.  Continue to follow daily.  Hyponatremia: Secondary to hypovolemia/HCTZ use-and AKI-resolved with supportive care.  Normocytic anemia: Probably related to CKD-has required 2 unit of PRBC so far.  SPEP-without M spike.  May require another unit-follow closely.  Constipation: Resolved.  Hypothyroidism: Continue Synthroid-TSH stable.  Deconditioning: Rehab services following  Diet: Diet Order            Diet renal with fluid restriction Fluid restriction: 1200 mL Fluid; Room service appropriate? Yes; Fluid consistency: Thin  Diet effective now                  Code Status: Full code   Family Communication: Daughter Jasmine White over the phone on 7/9-we will update over the next few days  Disposition Plan: Status is: Inpatient  Remains inpatient appropriate because:Inpatient level of care appropriate due to severity of illness  Dispo: The patient is from: Home              Anticipated d/c is to: Home              Anticipated d/c date is: > 3 days  Patient currently is not medically stable to d/c.  Barriers to Discharge: Requires daily plasmapheresis x2 weeks total-before discharge with outpatient  HD.  Antimicrobial agents: Anti-infectives (From admission, onward)   Start     Dose/Rate Route Frequency Ordered Stop   03/26/20 1000  sulfamethoxazole-trimethoprim (BACTRIM) 400-80 MG per tablet 1 tablet     Discontinue     1 tablet Oral Every M-W-F 03/25/20 0954     03/25/20 2200  sulfamethoxazole-trimethoprim (BACTRIM) 400-80 MG per tablet 1 tablet  Status:  Discontinued        1 tablet Oral Daily at bedtime 03/25/20 0854 03/25/20 0921   03/20/20 1319  ceFAZolin (ANCEF) 2-4 GM/100ML-% IVPB       Note to Pharmacy: Jasmine White   : cabinet override      03/20/20 1319 03/21/20 0129   03/20/20 1100  ceFAZolin (ANCEF) IVPB 2g/100 mL premix        2 g 200 mL/hr over 30 Minutes Intravenous On call 03/20/20 1040 03/20/20 1506   03/19/20 0000  cefTRIAXone (ROCEPHIN) 1 g in sodium chloride 0.9 % 100 mL IVPB       Note to Pharmacy: 1st dose now   1 g 200 mL/hr over 30 Minutes Intravenous Every 24 hours 03/18/20 0127 03/21/20 0008   03/18/20 0145  cefTRIAXone (ROCEPHIN) 1 g in sodium chloride 0.9 % 100 mL IVPB       Note to Pharmacy: 1st dose now   1 g 200 mL/hr over 30 Minutes Intravenous  Once 03/18/20 0135 03/18/20 0252       Time spent: 15- minutes-Greater than 50% of this time was spent in counseling, explanation of diagnosis, planning of further management, and coordination of care.  MEDICATIONS: Scheduled Meds: . sodium chloride   Intravenous Once  . amLODipine  10 mg Oral Daily  . Chlorhexidine Gluconate Cloth  6 each Topical Q0600  . cholecalciferol  1,000 Units Oral Daily  . cyclophosphamide  50 mg Oral q1800  . [START ON 04/06/2020] darbepoetin (ARANESP) injection - DIALYSIS  40 mcg Intravenous Q Fri-HD  . docusate sodium  200 mg Oral BID  . heparin injection (subcutaneous)  5,000 Units Subcutaneous Q8H  . hydrALAZINE  100 mg Oral TID  . levothyroxine  88 mcg Oral Q0600  . metoprolol tartrate  100 mg Oral BID  . pantoprazole  40 mg Oral Daily  . polyethylene glycol   17 g Oral BID  . predniSONE  40 mg Oral Q breakfast  . sulfamethoxazole-trimethoprim  1 tablet Oral Q M,W,F   Continuous Infusions:  PRN Meds:.acetaminophen, albuterol, [DISCONTINUED] ondansetron **OR** ondansetron (ZOFRAN) IV, technetium albumin aggregated   PHYSICAL EXAM: Vital signs: Vitals:   04/01/20 0324 04/01/20 0725 04/01/20 0755 04/01/20 0758  BP: (!) 158/71  (!) 143/73 (!) 143/73  Pulse: 78   89  Resp: 20 20  (!) 23  Temp: 98 F (36.7 C)   98.2 F (36.8 C)  TempSrc: Oral   Oral  SpO2: 95%     Weight:      Height:       Filed Weights   03/28/20 1113 03/30/20 0725 03/30/20 1145  Weight: 61.5 kg 65.4 kg 64.2 kg   Body mass index is 22.84 kg/m.   Gen Exam:Alert awake-not in any distress HEENT:atraumatic, normocephalic Chest: B/L clear to auscultation anteriorly CVS:S1S2 regular Abdomen:soft non tender, non distended Extremities:no edema Neurology: Non focal Skin: no rash  I have personally reviewed following labs and imaging studies  LABORATORY DATA:  CBC: Recent Labs  Lab 03/27/20 1235 03/28/20 0757 03/30/20 0753 03/31/20 0315 04/01/20 0715  WBC 18.0* 18.0* 15.7* 8.5 9.1  HGB 8.8* 8.1* 7.1* 6.0* 7.4*  HCT 27.7* 25.7* 22.3* 18.9* 23.2*  MCV 86.6 88.9 89.6 88.3 87.5  PLT 138* 145* 146* 123* 124*    Basic Metabolic Panel: Recent Labs  Lab 03/28/20 0757 03/28/20 0757 03/29/20 1155 03/30/20 0752 03/31/20 0315 03/31/20 1431 04/01/20 0715  NA 136   < > 135 133* 138 137 138  K 3.2*   < > 3.5 3.3* 3.9 3.9 3.7  CL 92*   < > 95* 90* 95* 94* 93*  CO2 28   < > 28 31 34* 30 33*  GLUCOSE 205*   < > 137* 95 93 217* 96  BUN 49*   < > 27* 38* 18 28* 38*  CREATININE 5.00*   < > 3.61* 4.50* 2.68* 3.77* 4.87*  CALCIUM 8.6*   < > 8.4* 8.8* 7.9* 8.0* 8.5*  PHOS 3.5  --  2.5 4.1 3.0  --  4.2   < > = values in this interval not displayed.    GFR: Estimated Creatinine Clearance: 9.5 mL/min (A) (by C-G formula based on SCr of 4.87 mg/dL (H)).  Liver  Function Tests: Recent Labs  Lab 03/28/20 0757 03/29/20 1155 03/30/20 0752 03/31/20 0315 04/01/20 0715  ALBUMIN 2.9* 2.8* 2.9* 2.5* 2.8*   No results for input(s): LIPASE, AMYLASE in the last 168 hours. No results for input(s): AMMONIA in the last 168 hours.  Coagulation Profile: No results for input(s): INR, PROTIME in the last 168 hours.  Cardiac Enzymes: No results for input(s): CKTOTAL, CKMB, CKMBINDEX, TROPONINI in the last 168 hours.  BNP (last 3 results) No results for input(s): PROBNP in the last 8760 hours.  Lipid Profile: No results for input(s): CHOL, HDL, LDLCALC, TRIG, CHOLHDL, LDLDIRECT in the last 72 hours.  Thyroid Function Tests: No results for input(s): TSH, T4TOTAL, FREET4, T3FREE, THYROIDAB in the last 72 hours.  Anemia Panel: No results for input(s): VITAMINB12, FOLATE, FERRITIN, TIBC, IRON, RETICCTPCT in the last 72 hours.  Urine analysis:    Component Value Date/Time   COLORURINE AMBER (A) 03/18/2020 2143   APPEARANCEUR HAZY (A) 03/18/2020 2143   APPEARANCEUR Clear 03/03/2019 0920   LABSPEC 1.008 03/18/2020 2143   PHURINE 6.0 03/18/2020 2143   GLUCOSEU NEGATIVE 03/18/2020 2143   HGBUR LARGE (A) 03/18/2020 2143   BILIRUBINUR NEGATIVE 03/18/2020 2143   BILIRUBINUR Negative 03/03/2019 0920   KETONESUR NEGATIVE 03/18/2020 2143   PROTEINUR 100 (A) 03/18/2020 2143   UROBILINOGEN negative 12/21/2012 1509   NITRITE NEGATIVE 03/18/2020 2143   LEUKOCYTESUR SMALL (A) 03/18/2020 2143    Sepsis Labs: Lactic Acid, Venous No results found for: LATICACIDVEN  MICROBIOLOGY: No results found for this or any previous visit (from the past 240 hour(s)).  RADIOLOGY STUDIES/RESULTS: VAS [place] UPPER EXT VEIN MAPPING (PRE-OP AVF)  Result Date: 04/01/2020 UPPER EXTREMITY VEIN MAPPING  Indications: Pre-dialysis access. Comparison Study: No prior study on file Performing Technologist: Sharion Dove RVS  Examination Guidelines: A complete evaluation includes B-mode  imaging, spectral Doppler, color Doppler, and power Doppler as needed of all accessible portions of each vessel. Bilateral testing is considered an integral part of a complete examination. Limited examinations for reoccurring indications may be performed as noted. +-----------------+-------------+----------+------------------------------+ Right Cephalic   Diameter (cm)Depth (cm)           Findings            +-----------------+-------------+----------+------------------------------+  Prox upper arm       0.24      3655.00                                 +-----------------+-------------+----------+------------------------------+ Mid upper arm        0.30        0.44                                  +-----------------+-------------+----------+------------------------------+ Dist upper arm       0.22        0.54                                  +-----------------+-------------+----------+------------------------------+ Antecubital fossa    0.43        0.28                                  +-----------------+-------------+----------+------------------------------+ Prox forearm                            not visualized and bandages/IV +-----------------+-------------+----------+------------------------------+ Mid forearm          0.34        0.42                                  +-----------------+-------------+----------+------------------------------+ Wrist                0.34        0.33                                  +-----------------+-------------+----------+------------------------------+ +-----------------+-------------+----------+----------------+ Left Cephalic    Diameter (cm)Depth (cm)    Findings     +-----------------+-------------+----------+----------------+ Prox upper arm       0.25        0.71                    +-----------------+-------------+----------+----------------+ Mid upper arm        0.25        0.49                     +-----------------+-------------+----------+----------------+ Dist upper arm       0.24        0.71                    +-----------------+-------------+----------+----------------+ Antecubital fossa    0.33        0.27   partial thrombus +-----------------+-------------+----------+----------------+ Prox forearm         0.30        0.37      branching     +-----------------+-------------+----------+----------------+ Mid forearm          0.31        0.57                    +-----------------+-------------+----------+----------------+ Wrist                0.35        0.37                    +-----------------+-------------+----------+----------------+ +-----------------+-------------+----------+---------+  Left Basilic     Diameter (cm)Depth (cm)Findings  +-----------------+-------------+----------+---------+ Prox upper arm       0.43        0.97    origin   +-----------------+-------------+----------+---------+ Mid upper arm        0.44        0.90             +-----------------+-------------+----------+---------+ Dist upper arm       0.36        0.79   branching +-----------------+-------------+----------+---------+ Antecubital fossa    0.43        0.90             +-----------------+-------------+----------+---------+ Prox forearm         0.32        0.52             +-----------------+-------------+----------+---------+ Mid forearm          0.46        2.00             +-----------------+-------------+----------+---------+ Wrist                0.40        0.40             +-----------------+-------------+----------+---------+ *See table(s) above for measurements and observations.  Diagnosing physician:    Preliminary      LOS: 15 days   Oren Binet, MD  [facility]    To contact the attending provider between 7A-7P or the covering provider during after hours 7P-7A, please log into the web site [email or web address] and access using  universal [place] password for that web site. If you do not have the password, please call the hospital operator.  04/01/2020, 1:40 PM

## 2020-04-02 ENCOUNTER — Encounter (HOSPITAL_COMMUNITY): Payer: Self-pay | Admitting: Internal Medicine

## 2020-04-02 ENCOUNTER — Encounter (HOSPITAL_COMMUNITY)
Admission: EM | Disposition: A | Discharge status: Home Health | Payer: Self-pay | Source: Home / Self Care | Attending: Internal Medicine

## 2020-04-02 ENCOUNTER — Inpatient Hospital Stay (HOSPITAL_COMMUNITY): Payer: Medicare PPO | Admitting: Certified Registered Nurse Anesthetist

## 2020-04-02 ENCOUNTER — Encounter (HOSPITAL_COMMUNITY): Payer: Self-pay

## 2020-04-02 HISTORY — PX: AV FISTULA PLACEMENT: SHX1204

## 2020-04-02 LAB — POCT I-STAT, CHEM 8
BUN: 15 mg/dL (ref 8–23)
Calcium, Ion: 1.1 mmol/L — ABNORMAL LOW (ref 1.15–1.40)
Chloride: 95 mmol/L — ABNORMAL LOW (ref 98–111)
Creatinine, Ser: 2.2 mg/dL — ABNORMAL HIGH (ref 0.44–1.00)
Glucose, Bld: 98 mg/dL (ref 70–99)
HCT: 22 % — ABNORMAL LOW (ref 36.0–46.0)
Hemoglobin: 7.5 g/dL — ABNORMAL LOW (ref 12.0–15.0)
Potassium: 4.3 mmol/L (ref 3.5–5.1)
Sodium: 140 mmol/L (ref 135–145)
TCO2: 37 mmol/L — ABNORMAL HIGH (ref 22–32)

## 2020-04-02 LAB — CBC
HCT: 23 % — ABNORMAL LOW (ref 36.0–46.0)
Hemoglobin: 7.4 g/dL — ABNORMAL LOW (ref 12.0–15.0)
MCH: 28.6 pg (ref 26.0–34.0)
MCHC: 32.2 g/dL (ref 30.0–36.0)
MCV: 88.8 fL (ref 80.0–100.0)
Platelets: 133 10*3/uL — ABNORMAL LOW (ref 150–400)
RBC: 2.59 MIL/uL — ABNORMAL LOW (ref 3.87–5.11)
RDW: 16.2 % — ABNORMAL HIGH (ref 11.5–15.5)
WBC: 11.1 10*3/uL — ABNORMAL HIGH (ref 4.0–10.5)
nRBC: 0 % (ref 0.0–0.2)

## 2020-04-02 LAB — RENAL FUNCTION PANEL
Albumin: 2.9 g/dL — ABNORMAL LOW (ref 3.5–5.0)
Anion gap: 14 (ref 5–15)
BUN: 51 mg/dL — ABNORMAL HIGH (ref 8–23)
CO2: 31 mmol/L (ref 22–32)
Calcium: 8.7 mg/dL — ABNORMAL LOW (ref 8.9–10.3)
Chloride: 94 mmol/L — ABNORMAL LOW (ref 98–111)
Creatinine, Ser: 6.26 mg/dL — ABNORMAL HIGH (ref 0.44–1.00)
GFR calc Af Amer: 7 mL/min — ABNORMAL LOW (ref >60)
GFR calc non Af Amer: 6 mL/min — ABNORMAL LOW (ref >60)
Glucose, Bld: 91 mg/dL (ref 70–99)
Phosphorus: 4.4 mg/dL (ref 2.5–4.6)
Potassium: 3.7 mmol/L (ref 3.5–5.1)
Sodium: 139 mmol/L (ref 135–145)

## 2020-04-02 LAB — PROTIME-INR
INR: 1 (ref 0.8–1.2)
Prothrombin Time: 12.4 seconds (ref 11.4–15.2)

## 2020-04-02 SURGERY — ARTERIOVENOUS (AV) FISTULA CREATION
Anesthesia: General | Site: Arm Lower | Laterality: Left

## 2020-04-02 MED ORDER — ANTICOAGULANT SODIUM CITRATE 4% (200MG/5ML) IV SOLN
5.0000 mL | Freq: Once | Status: DC
  Filled 2020-04-02: qty 5

## 2020-04-02 MED ORDER — SODIUM CHLORIDE 0.9 % IV SOLN: INTRAVENOUS | Status: DC | PRN

## 2020-04-02 MED ORDER — FENTANYL CITRATE (PF) 250 MCG/5ML IJ SOLN
INTRAMUSCULAR | Status: DC | PRN
  Administered 2020-04-02 (×2): 25 ug via INTRAVENOUS

## 2020-04-02 MED ORDER — ACD FORMULA A 0.73-2.45-2.2 GM/100ML VI SOLN
1000.0000 mL | Status: DC
  Administered 2020-04-02: 1000 mL

## 2020-04-02 MED ORDER — FENTANYL CITRATE (PF) 250 MCG/5ML IJ SOLN
INTRAMUSCULAR | Status: AC
  Filled 2020-04-02: qty 5

## 2020-04-02 MED ORDER — DIPHENHYDRAMINE HCL 25 MG PO CAPS
ORAL_CAPSULE | ORAL | Status: AC
  Filled 2020-04-02: qty 1

## 2020-04-02 MED ORDER — CHLORHEXIDINE GLUCONATE 0.12 % MT SOLN
OROMUCOSAL | Status: AC
  Administered 2020-04-02: 15 mL via OROMUCOSAL
  Filled 2020-04-02: qty 15

## 2020-04-02 MED ORDER — DEXAMETHASONE SODIUM PHOSPHATE 10 MG/ML IJ SOLN
INTRAMUSCULAR | Status: AC
  Filled 2020-04-02: qty 1

## 2020-04-02 MED ORDER — DIPHENHYDRAMINE HCL 25 MG PO CAPS: 25.0000 mg | ORAL_CAPSULE | Freq: Four times a day (QID) | ORAL | Status: DC | PRN

## 2020-04-02 MED ORDER — ACETAMINOPHEN 325 MG PO TABS: 650.0000 mg | ORAL_TABLET | ORAL | Status: DC | PRN

## 2020-04-02 MED ORDER — PROPOFOL 10 MG/ML IV BOLUS
INTRAVENOUS | Status: AC
  Filled 2020-04-02: qty 20

## 2020-04-02 MED ORDER — ONDANSETRON HCL 4 MG/2ML IJ SOLN: 4.0000 mg | Freq: Once | INTRAMUSCULAR | Status: DC | PRN

## 2020-04-02 MED ORDER — CALCIUM GLUCONATE-NACL 2-0.675 GM/100ML-% IV SOLN
2.0000 g | Freq: Once | INTRAVENOUS | Status: DC
  Administered 2020-04-02: 2000 mg via INTRAVENOUS
  Filled 2020-04-02: qty 100

## 2020-04-02 MED ORDER — ACD FORMULA A 0.73-2.45-2.2 GM/100ML VI SOLN
Status: AC
  Filled 2020-04-02: qty 500

## 2020-04-02 MED ORDER — CALCIUM CARBONATE ANTACID 500 MG PO CHEW
CHEWABLE_TABLET | ORAL | Status: AC
  Filled 2020-04-02: qty 2

## 2020-04-02 MED ORDER — ACETAMINOPHEN 325 MG PO TABS
ORAL_TABLET | ORAL | Status: AC
  Filled 2020-04-02: qty 2

## 2020-04-02 MED ORDER — CALCIUM CARBONATE ANTACID 500 MG PO CHEW
2.0000 | CHEWABLE_TABLET | ORAL | Status: DC
  Administered 2020-04-02: 400 mg via ORAL

## 2020-04-02 MED ORDER — HYDROCODONE-ACETAMINOPHEN 5-325 MG PO TABS
1.0000 | ORAL_TABLET | ORAL | Status: DC | PRN
  Administered 2020-04-04: 1 via ORAL
  Filled 2020-04-02: qty 1

## 2020-04-02 MED ORDER — LIDOCAINE 2% (20 MG/ML) 5 ML SYRINGE
INTRAMUSCULAR | Status: DC | PRN
  Administered 2020-04-02: 80 mg via INTRAVENOUS

## 2020-04-02 MED ORDER — ONDANSETRON HCL 4 MG/2ML IJ SOLN
INTRAMUSCULAR | Status: AC
  Filled 2020-04-02: qty 2

## 2020-04-02 MED ORDER — PHENYLEPHRINE HCL-NACL 10-0.9 MG/250ML-% IV SOLN
INTRAVENOUS | Status: DC | PRN
  Administered 2020-04-02: 20 ug/min via INTRAVENOUS

## 2020-04-02 MED ORDER — 0.9 % SODIUM CHLORIDE (POUR BTL) OPTIME
TOPICAL | Status: DC | PRN
  Administered 2020-04-02: 1000 mL

## 2020-04-02 MED ORDER — FENTANYL CITRATE (PF) 100 MCG/2ML IJ SOLN: 25.0000 ug | INTRAMUSCULAR | Status: DC | PRN

## 2020-04-02 MED ORDER — PHENYLEPHRINE HCL (PRESSORS) 10 MG/ML IV SOLN
INTRAVENOUS | Status: DC | PRN
  Administered 2020-04-02: 80 ug via INTRAVENOUS

## 2020-04-02 MED ORDER — ONDANSETRON HCL 4 MG/2ML IJ SOLN
INTRAMUSCULAR | Status: DC | PRN
  Administered 2020-04-02: 4 mg via INTRAVENOUS

## 2020-04-02 MED ORDER — CHLORHEXIDINE GLUCONATE 0.12 % MT SOLN: 15.0000 mL | Freq: Once | OROMUCOSAL | Status: AC

## 2020-04-02 MED ORDER — PROPOFOL 10 MG/ML IV BOLUS
INTRAVENOUS | Status: DC | PRN
  Administered 2020-04-02: 110 mg via INTRAVENOUS

## 2020-04-02 MED ORDER — DEXAMETHASONE SODIUM PHOSPHATE 10 MG/ML IJ SOLN
INTRAMUSCULAR | Status: DC | PRN
  Administered 2020-04-02: 5 mg via INTRAVENOUS

## 2020-04-02 MED ORDER — ANTICOAGULANT SODIUM CITRATE 4% (200MG/5ML) IV SOLN
5.0000 mL | Freq: Once | Status: DC
  Administered 2020-04-02: 5 mL
  Filled 2020-04-02: qty 5

## 2020-04-02 MED ORDER — SODIUM CHLORIDE 0.9 % IV SOLN: INTRAVENOUS | Status: DC

## 2020-04-02 SURGICAL SUPPLY — 27 items
ARMBAND PINK RESTRICT EXTREMIT (MISCELLANEOUS) ×4 IMPLANT
CANISTER SUCT 3000ML PPV (MISCELLANEOUS) ×2 IMPLANT
CANNULA VESSEL 3MM 2 BLNT TIP (CANNULA) ×2 IMPLANT
CLIP LIGATING EXTRA MED SLVR (CLIP) ×2 IMPLANT
CLIP LIGATING EXTRA SM BLUE (MISCELLANEOUS) ×2 IMPLANT
COVER PROBE W GEL 5X96 (DRAPES) ×2 IMPLANT
COVER WAND RF STERILE (DRAPES) ×2 IMPLANT
DECANTER SPIKE VIAL GLASS SM (MISCELLANEOUS) ×2 IMPLANT
DERMABOND ADVANCED (GAUZE/BANDAGES/DRESSINGS) ×1
DERMABOND ADVANCED .7 DNX12 (GAUZE/BANDAGES/DRESSINGS) ×1 IMPLANT
ELECT REM PT RETURN 9FT ADLT (ELECTROSURGICAL) ×2
ELECTRODE REM PT RTRN 9FT ADLT (ELECTROSURGICAL) ×1 IMPLANT
GLOVE SS BIOGEL STRL SZ 7.5 (GLOVE) ×1 IMPLANT
GLOVE SUPERSENSE BIOGEL SZ 7.5 (GLOVE) ×1
GOWN STRL REUS W/ TWL LRG LVL3 (GOWN DISPOSABLE) ×3 IMPLANT
GOWN STRL REUS W/TWL LRG LVL3 (GOWN DISPOSABLE) ×6
KIT BASIN OR (CUSTOM PROCEDURE TRAY) ×2 IMPLANT
KIT TURNOVER KIT B (KITS) ×2 IMPLANT
NS IRRIG 1000ML POUR BTL (IV SOLUTION) ×2 IMPLANT
PACK CV ACCESS (CUSTOM PROCEDURE TRAY) ×2 IMPLANT
PAD ARMBOARD 7.5X6 YLW CONV (MISCELLANEOUS) ×4 IMPLANT
SUT PROLENE 6 0 CC (SUTURE) ×2 IMPLANT
SUT VIC AB 3-0 SH 27 (SUTURE) ×2
SUT VIC AB 3-0 SH 27X BRD (SUTURE) ×1 IMPLANT
TOWEL GREEN STERILE (TOWEL DISPOSABLE) ×2 IMPLANT
UNDERPAD 30X36 HEAVY ABSORB (UNDERPADS AND DIAPERS) ×2 IMPLANT
WATER STERILE IRR 1000ML POUR (IV SOLUTION) ×2 IMPLANT

## 2020-04-02 NOTE — Progress Notes (Signed)
Patient in dialysis, then plasmapheresis, then fistula placement. Not in room upon arrival to the floor. We follow up as needed if patient arrives back on the floor before shift change.  Cathlean Marseilles Tamala Julian, MSN, RN, [place], AGCNS

## 2020-04-02 NOTE — Anesthesia Procedure Notes (Signed)
Procedure Name: LMA Insertion Date/Time: 04/02/2020 4:02 PM Performed by: Valda Favia, CRNA Pre-anesthesia Checklist: Patient identified, Emergency Drugs available, Suction available and Patient being monitored Patient Re-evaluated:Patient Re-evaluated prior to induction Oxygen Delivery Method: Circle System Utilized Preoxygenation: Pre-oxygenation with 100% oxygen Induction Type: IV induction Ventilation: Mask ventilation without difficulty LMA: LMA inserted LMA Size: 4.0 Number of attempts: 1 Airway Equipment and Method: Bite block Placement Confirmation: positive ETCO2 Tube secured with: Tape Dental Injury: Teeth and Oropharynx as per pre-operative assessment

## 2020-04-02 NOTE — Interval H&P Note (Signed)
History and Physical Interval Note:  04/02/2020 3:27 PM  Jasmine White  has presented today for surgery, with the diagnosis of Hyponatrimia/dialysis.  The various methods of treatment have been discussed with the patient and family. After consideration of risks, benefits and other options for treatment, the patient has consented to  Procedure(s): ARTERIOVENOUS (AV) FISTULA CREATION VS. GRAFT (Left) as a surgical intervention.  The patient's history has been reviewed, patient examined, no change in status, stable for surgery.  I have reviewed the patient's chart and labs.  Questions were answered to the patient's satisfaction.     Curt Jews

## 2020-04-02 NOTE — Progress Notes (Addendum)
Patient ID: Jasmine White, female   DOB: [birth year], 74 y.o.   MRN: [id number] S: Seen in HD, no new symptoms.  No hemoptysis, rashes, fevers, chills.  UOP x1 yesterday - 1st time in a while, says about medicine cup full.    O:BP (!) 153/78 (BP Location: Right Arm)   Pulse 75   Temp 98.2 F (36.8 C) (Oral)   Resp 18   Ht 5\' 6"  (1.676 m)   Wt 67.6 kg   SpO2 95%   BMI 24.05 kg/m   Intake/Output Summary (Last 24 hours) at 04/02/2020 1000 Last data filed at 04/01/2020 2105 Gross per 24 hour  Intake 320 ml  Output 2 ml  Net 318 ml   Intake/Output: I/O last 3 completed shifts: In: 720 [P.O.:720] Out: 2 [Urine:1; Stool:1]  Intake/Output this shift:  No intake/output data recorded. Weight change:  Gen: NAD CVS: no rub Resp: normal WOB on RA; faint bibasilar rales Abd:+BS, soft, nt/nd Ext: no edema  Recent Labs  Lab 03/27/20 1125 03/27/20 1125 03/28/20 0757 03/29/20 1155 03/30/20 0752 03/31/20 0315 03/31/20 1431 04/01/20 0715 04/02/20 0310  NA 137   < > 136 135 133* 138 137 138 139  K 3.0*   < > 3.2* 3.5 3.3* 3.9 3.9 3.7 3.7  CL 93*   < > 92* 95* 90* 95* 94* 93* 94*  CO2 28   < > 28 28 31  34* 30 33* 31  GLUCOSE 146*   < > 205* 137* 95 93 217* 96 91  BUN 33*   < > 49* 27* 38* 18 28* 38* 51*  CREATININE 3.73*   < > 5.00* 3.61* 4.50* 2.68* 3.77* 4.87* 6.26*  ALBUMIN 2.8*  --  2.9* 2.8* 2.9* 2.5*  --  2.8* 2.9*  CALCIUM 8.2*   < > 8.6* 8.4* 8.8* 7.9* 8.0* 8.5* 8.7*  PHOS 2.7  --  3.5 2.5 4.1 3.0  --  4.2 4.4   < > = values in this interval not displayed.   Liver Function Tests: Recent Labs  Lab 03/31/20 0315 04/01/20 0715 04/02/20 0310  ALBUMIN 2.5* 2.8* 2.9*   No results for input(s): LIPASE, AMYLASE in the last 168 hours. No results for input(s): AMMONIA in the last 168 hours. CBC: Recent Labs  Lab 03/28/20 0757 03/28/20 0757 03/30/20 0753 03/30/20 0753 03/31/20 0315 04/01/20 0715 04/02/20 0310  WBC 18.0*   < > 15.7*   < > 8.5 9.1 11.1*  HGB 8.1*   < >  7.1*   < > 6.0* 7.4* 7.4*  HCT 25.7*   < > 22.3*   < > 18.9* 23.2* 23.0*  MCV 88.9  --  89.6  --  88.3 87.5 88.8  PLT 145*   < > 146*   < > 123* 124* 133*   < > = values in this interval not displayed.   Cardiac Enzymes: No results for input(s): CKTOTAL, CKMB, CKMBINDEX, TROPONINI in the last 168 hours. CBG: No results for input(s): GLUCAP in the last 168 hours.  Iron Studies: No results for input(s): IRON, TIBC, TRANSFERRIN, FERRITIN in the last 72 hours. Studies/Results: VAS [place] UPPER EXT VEIN MAPPING (PRE-OP AVF)  Result Date: 04/02/2020 UPPER EXTREMITY VEIN MAPPING  Indications: Pre-dialysis access. Comparison Study: No prior study on file Performing Technologist: Sharion Dove RVS  Examination Guidelines: A complete evaluation includes B-mode imaging, spectral Doppler, color Doppler, and power Doppler as needed of all accessible portions of each vessel. Bilateral testing is considered an integral  part of a complete examination. Limited examinations for reoccurring indications may be performed as noted. +-----------------+-------------+----------+------------------------------+ Right Cephalic   Diameter (cm)Depth (cm)           Findings            +-----------------+-------------+----------+------------------------------+ Prox upper arm       0.24      3655.00                                 +-----------------+-------------+----------+------------------------------+ Mid upper arm        0.30        0.44                                  +-----------------+-------------+----------+------------------------------+ Dist upper arm       0.22        0.54                                  +-----------------+-------------+----------+------------------------------+ Antecubital fossa    0.43        0.28                                  +-----------------+-------------+----------+------------------------------+ Prox forearm                            not visualized and bandages/IV  +-----------------+-------------+----------+------------------------------+ Mid forearm          0.34        0.42                                  +-----------------+-------------+----------+------------------------------+ Wrist                0.34        0.33                                  +-----------------+-------------+----------+------------------------------+ +-----------------+-------------+----------+----------------+ Left Cephalic    Diameter (cm)Depth (cm)    Findings     +-----------------+-------------+----------+----------------+ Prox upper arm       0.25        0.71                    +-----------------+-------------+----------+----------------+ Mid upper arm        0.25        0.49                    +-----------------+-------------+----------+----------------+ Dist upper arm       0.24        0.71                    +-----------------+-------------+----------+----------------+ Antecubital fossa    0.33        0.27   partial thrombus +-----------------+-------------+----------+----------------+ Prox forearm         0.30        0.37      branching     +-----------------+-------------+----------+----------------+ Mid forearm          0.31        0.57                    +-----------------+-------------+----------+----------------+  Wrist                0.35        0.37                    +-----------------+-------------+----------+----------------+ +-----------------+-------------+----------+---------+ Left Basilic     Diameter (cm)Depth (cm)Findings  +-----------------+-------------+----------+---------+ Prox upper arm       0.43        0.97    origin   +-----------------+-------------+----------+---------+ Mid upper arm        0.44        0.90             +-----------------+-------------+----------+---------+ Dist upper arm       0.36        0.79   branching +-----------------+-------------+----------+---------+ Antecubital fossa     0.43        0.90             +-----------------+-------------+----------+---------+ Prox forearm         0.32        0.52             +-----------------+-------------+----------+---------+ Mid forearm          0.46        2.00             +-----------------+-------------+----------+---------+ Wrist                0.40        0.40             +-----------------+-------------+----------+---------+ *See table(s) above for measurements and observations.  Diagnosing physician: Harold Barban MD Electronically signed by Harold Barban MD on 04/02/2020 at 6:33:54 AM.    Final    . sodium chloride   Intravenous Once  . amLODipine  10 mg Oral Daily  . calcium carbonate  2 tablet Oral Q3H  . Chlorhexidine Gluconate Cloth  6 each Topical Q0600  . cholecalciferol  1,000 Units Oral Daily  . cyclophosphamide  50 mg Oral q1800  . [START ON 04/06/2020] darbepoetin (ARANESP) injection - DIALYSIS  40 mcg Intravenous Q Fri-HD  . docusate sodium  200 mg Oral BID  . heparin injection (subcutaneous)  5,000 Units Subcutaneous Q8H  . hydrALAZINE  100 mg Oral TID  . levothyroxine  88 mcg Oral Q0600  . metoprolol tartrate  100 mg Oral BID  . pantoprazole  40 mg Oral Daily  . polyethylene glycol  17 g Oral BID  . predniSONE  40 mg Oral Q breakfast  . sulfamethoxazole-trimethoprim  1 tablet Oral Q M,W,F    BMET    Component Value Date/Time   NA 139 04/02/2020 0310   NA 128 (L) 03/01/2020 0927   K 3.7 04/02/2020 0310   CL 94 (L) 04/02/2020 0310   CO2 31 04/02/2020 0310   GLUCOSE 91 04/02/2020 0310   BUN 51 (H) 04/02/2020 0310   BUN 67 (H) 03/01/2020 0927   CREATININE 6.26 (H) 04/02/2020 0310   CREATININE 0.84 12/21/2012 1552   CALCIUM 8.7 (L) 04/02/2020 0310   GFRNONAA 6 (L) 04/02/2020 0310   GFRNONAA 72 12/21/2012 1552   GFRAA 7 (L) 04/02/2020 0310   GFRAA 83 12/21/2012 1552   CBC    Component Value Date/Time   WBC 11.1 (H) 04/02/2020 0310   RBC 2.59 (L) 04/02/2020 0310   HGB 7.4 (L)  04/02/2020 0310   HGB 9.7 (L) 03/01/2020 0927   HCT 23.0 (L) 04/02/2020 0310   HCT 28.8 (  L) 03/01/2020 0927   PLT 133 (L) 04/02/2020 0310   PLT 397 03/01/2020 0927   MCV 88.8 04/02/2020 0310   MCV 79 03/01/2020 0927   MCH 28.6 04/02/2020 0310   MCHC 32.2 04/02/2020 0310   RDW 16.2 (H) 04/02/2020 0310   RDW 14.7 03/01/2020 0927   LYMPHSABS 0.3 (L) 03/22/2020 0103   LYMPHSABS 1.3 03/01/2020 0927   MONOABS 0.3 03/22/2020 0103   EOSABS 0.0 03/22/2020 0103   EOSABS 0.2 03/01/2020 0927   BASOSABS 0.0 03/22/2020 0103   BASOSABS 0.1 03/01/2020 0927    Assessment/Plan:  1. 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 Started on daily plasmapheresis and IV solumedrol/po cytoxan. Requiring HD.  1. Biopsy with 100% crescents and significant amount of ATN. 2. [place] continue with HD on MWF schedule for now. 3. For pheresis with [facility] due to possible alveolar hemorrhagedaily.   4. Continue cytoxan 50 mg daily 5. Decrease prednisone to 40 mg daily and try to taper more quickly given agitation 6. She will have completed10 out of 14 sessions ofpheresis; resending GBM Ab tomorrow AM.  7. Bactrim MWF for PCP prophylaxis 8. Continue to follow for renal recovery, however doubtful given extensive crescents on biopsy. Will need AVF/AVG placement next week prior to discharge since no sign of renal recovery. 9. Awaiting outpatient HD at [facility] as she would like to stay with our practice.  Continue with current schedule for now. 2. Hyponatremia- due to hypovolemia and concomitant HCTZ use and AKI.  Resolved. 3. HTN- follow with HD and UF --> was normotensive this AM.  4. Vascular access- RIJ TDC placed 03/20/20 by IR.   [facility] consulted 7/11 for AVF or AVG placement prior to discharge given poor likelihood of renal recovery given biopsy results.  5. Thrombocytopenia - noted modest thrombocytopenia in last few days - stable but noted; CTM.  6. Disposition- will  need outpatient HD spot, AVF/AVG, and completion of 14 plasmapheresis sessions prior to discharge.  Jannifer Hick MD [facility] Pager [phone]

## 2020-04-02 NOTE — Plan of Care (Signed)
  Problem: Education: Goal: Knowledge of General Education information will improve Description: Including pain rating scale, medication(s)/side effects and non-pharmacologic comfort measures Outcome: Not Progressing   Problem: Clinical Measurements: Goal: Ability to maintain clinical measurements within normal limits will improve Outcome: Not Progressing Goal: Diagnostic test results will improve Outcome: Not Progressing   Problem: Activity: Goal: Risk for activity intolerance will decrease Outcome: Not Progressing   Problem: Nutrition: Goal: Adequate nutrition will be maintained Outcome: Not Progressing   Problem: Elimination: Goal: Will not experience complications related to bowel motility Outcome: Not Progressing Goal: Will not experience complications related to urinary retention Outcome: Not Progressing

## 2020-04-02 NOTE — Progress Notes (Signed)
OT Cancellation Note  Patient Details Name: Jasmine White MRN: [id number] DOB: 03/28/1946   Cancelled Treatment:    Reason Eval/Treat Not Completed: Patient at procedure or test/ unavailable. Patient off floor.  Has dialysis, then plasmapheresis, then fistual placement.  Will check back tomorrow.  August Luz, OTR/L   Phylliss Bob 04/02/2020, 1:12 PM

## 2020-04-02 NOTE — Progress Notes (Signed)
PROGRESS NOTE        PATIENT DETAILS Name: Jasmine White Age: 74 y.o. Sex: female Date of Birth: [birth year] Admit Date: 03/17/2020 Admitting Physician Clance Boll, MD FAO:[id number], [name], FNP  Brief Narrative: Patient is a 74 y.o. female with history of HTN, hypothyroidism, HLD, recently diagnosed by PCP with renal dysfunction, history of COVID-19 infection in November 2020-since then been having persistent fatigue-who presented with persistent nausea/vomiting-further evaluation revealed severe hyponatremia with a sodium of 109, worsening renal function with a creatinine of 7.12- she was admitted-provided supportive care-subsequently nephrology was consulted-she underwent renal biopsy-which showed 100% crescents and significant amount of ATN.  She was subsequently started on plasmapheresis, Solu-Medrol/Cytoxan and also on hemodialysis.  See below for further details.  Significant events: 6/26>> admit to [facility] for severe hyponatremia, AKI  Significant studies: 6/26>> CT abdomen pelvis: Large volume of inspissated fecal material throughout the colon, moderately distended urinary bladder, cholelithiasis 6/27>> RUQ ultrasound: Cholelithiasis without evidence of cholecystitis 7/1>> VQ scan: Pulmonary embolism absent 7/1>> bilateral lower extremity Doppler: No DVT 7/3>> CT chest: Bilateral areas of mosaic groundglass and consolidative opacity-are nonspecific-in the setting of anti-GBM antibodies very early/developing alveolar hemorrhage could present with similar appearance.  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  Remains on steroids, Cytoxan and Bactrim MWF for PCP prophylaxis.  Due to neurological side effects/hyperactivity-steroids doses being stable tapered.  Vascular surgery following--for AVF placement by V VS later today.  Alveolar hemorrhage: No hemoptysis-not hypoxic-see CT chest above-started on plasmapheresis daily plasmapheresis x2 weeks (on 10/14 days).    Acute encephalopathy: Secondary to steroids-has improved-steroids being slowly tapered down-seems to be relatively stable on 40 mg of prednisone.  Continue to follow daily.  Hyponatremia: Secondary to hypovolemia/HCTZ use-and AKI-resolved with supportive care.  Normocytic anemia: Probably related to CKD-has required 2 unit of PRBC so far.  SPEP-without M spike.  May require another unit-follow closely.  Constipation: Resolved.  Hypothyroidism: Continue Synthroid-TSH stable.  Deconditioning: Rehab services following  Diet: Diet Order            Diet NPO time specified Except for: Sips with Meds  Diet effective midnight                  Code Status: Full code   Family Communication: Daughter [name] over the phone on 7/9-we will update over the next few days  Disposition Plan: Status is: Inpatient  Remains inpatient appropriate because:Inpatient level of care appropriate due to severity of illness  Dispo: The patient is from: Home              Anticipated d/c is to: Home              Anticipated d/c date is: > 3 days  Patient currently is not medically stable to d/c.  Barriers to Discharge: Requires daily plasmapheresis x2 weeks total (on 10/14 days)-before discharge  with outpatient HD.  Antimicrobial agents: Anti-infectives (From admission, onward)   Start     Dose/Rate Route Frequency Ordered Stop   04/02/20 0700  ceFAZolin (ANCEF) IVPB 1 g/50 mL premix     Discontinue    Note to Pharmacy: Send with pt to OR   1 g 100 mL/hr over 30 Minutes Intravenous To [facility] 04/01/20 2111 04/03/20 0700   03/26/20 1000  sulfamethoxazole-trimethoprim (BACTRIM) 400-80 MG per tablet 1 tablet     Discontinue     1 tablet Oral Every M-W-F 03/25/20 0954     03/25/20 2200  sulfamethoxazole-trimethoprim (BACTRIM) 400-80 MG per tablet 1 tablet  Status:  Discontinued        1 tablet Oral Daily at bedtime 03/25/20 0854 03/25/20 0921   03/20/20 1319  ceFAZolin (ANCEF) 2-4 GM/100ML-% IVPB       Note to Pharmacy: Arlean Hopping   : cabinet override      03/20/20 1319 03/21/20 0129   03/20/20 1100  ceFAZolin (ANCEF) IVPB 2g/100 mL premix        2 g 200 mL/hr over 30 Minutes Intravenous On call 03/20/20 1040 03/20/20 1506   03/19/20 0000  cefTRIAXone (ROCEPHIN) 1 g in sodium chloride 0.9 % 100 mL IVPB       Note to Pharmacy: 1st dose now   1 g 200 mL/hr over 30 Minutes Intravenous Every 24 hours 03/18/20 0127 03/21/20 0008   03/18/20 0145  cefTRIAXone (ROCEPHIN) 1 g in sodium chloride 0.9 % 100 mL IVPB       Note to Pharmacy: 1st dose now   1 g 200 mL/hr over 30 Minutes Intravenous  Once 03/18/20 0135 03/18/20 0252       Time spent: 15- minutes-Greater than 50% of this time was spent in counseling, explanation of diagnosis, planning of further management, and coordination of care.  MEDICATIONS: Scheduled Meds: . sodium chloride   Intravenous Once  . amLODipine  10 mg Oral Daily  . calcium carbonate      . Chlorhexidine Gluconate Cloth  6 each Topical Q0600  . cholecalciferol  1,000 Units Oral Daily  . cyclophosphamide  50 mg Oral q1800  . [START ON 04/06/2020] darbepoetin (ARANESP) injection - DIALYSIS  40 mcg Intravenous Q Fri-HD  . diphenhydrAMINE       . docusate sodium  200 mg Oral BID  . heparin injection (subcutaneous)  5,000 Units Subcutaneous Q8H  . hydrALAZINE  100 mg Oral TID  . levothyroxine  88 mcg Oral Q0600  . metoprolol tartrate  100 mg Oral BID  . pantoprazole  40 mg Oral Daily  . polyethylene glycol  17 g Oral BID  . predniSONE  40 mg Oral Q breakfast  . sulfamethoxazole-trimethoprim  1 tablet Oral Q M,W,F   Continuous Infusions: .  ceFAZolin (ANCEF) IV    . citrate dextrose     PRN Meds:.acetaminophen, albuterol, [DISCONTINUED] ondansetron **OR** ondansetron (ZOFRAN) IV, technetium albumin aggregated   PHYSICAL EXAM: Vital signs: Vitals:   04/02/20 1210 04/02/20 1241 04/02/20 1300 04/02/20 1312  BP: (!) 141/71 (!) 143/75 139/72 (!) 141/68  Pulse: 77 89 84 87  Resp: 17 16 17 16   Temp: 98.2 F (36.8 C) 98.3 F (36.8 C) 98.2 F (36.8 C) 98.4 F (36.9 C)  TempSrc: Oral Oral Oral Oral  SpO2:  98%    Weight:  Height:       Filed Weights   03/30/20 1145 04/02/20 0454 04/02/20 0709  Weight: 64.2 kg 69.7 kg 67.6 kg   Body mass index is 24.05 kg/m.   Gen Exam:Alert awake-not in any distress HEENT:atraumatic, normocephalic Chest: B/L clear to auscultation anteriorly CVS:S1S2 regular Abdomen:soft non tender, non distended Extremities:no edema Neurology: Non focal Skin: no rash  I have personally reviewed following labs and imaging studies  LABORATORY DATA: CBC: Recent Labs  Lab 03/28/20 0757 03/30/20 0753 03/31/20 0315 04/01/20 0715 04/02/20 0310  WBC 18.0* 15.7* 8.5 9.1 11.1*  HGB 8.1* 7.1* 6.0* 7.4* 7.4*  HCT 25.7* 22.3* 18.9* 23.2* 23.0*  MCV 88.9 89.6 88.3 87.5 88.8  PLT 145* 146* 123* 124* 133*    Basic Metabolic Panel: Recent Labs  Lab 03/29/20 1155 03/29/20 1155 03/30/20 0752 03/31/20 0315 03/31/20 1431 04/01/20 0715 04/02/20 0310  NA 135   < > 133* 138 137 138 139  K 3.5   < > 3.3* 3.9 3.9 3.7 3.7  CL 95*   < > 90* 95* 94* 93* 94*  CO2 28   < > 31 34* 30 33* 31    GLUCOSE 137*   < > 95 93 217* 96 91  BUN 27*   < > 38* 18 28* 38* 51*  CREATININE 3.61*   < > 4.50* 2.68* 3.77* 4.87* 6.26*  CALCIUM 8.4*   < > 8.8* 7.9* 8.0* 8.5* 8.7*  PHOS 2.5  --  4.1 3.0  --  4.2 4.4   < > = values in this interval not displayed.    GFR: Estimated Creatinine Clearance: 7.4 mL/min (A) (by C-G formula based on SCr of 6.26 mg/dL (H)).  Liver Function Tests: Recent Labs  Lab 03/29/20 1155 03/30/20 0752 03/31/20 0315 04/01/20 0715 04/02/20 0310  ALBUMIN 2.8* 2.9* 2.5* 2.8* 2.9*   No results for input(s): LIPASE, AMYLASE in the last 168 hours. No results for input(s): AMMONIA in the last 168 hours.  Coagulation Profile: Recent Labs  Lab 04/02/20 0310  INR 1.0    Cardiac Enzymes: No results for input(s): CKTOTAL, CKMB, CKMBINDEX, TROPONINI in the last 168 hours.  BNP (last 3 results) No results for input(s): PROBNP in the last 8760 hours.  Lipid Profile: No results for input(s): CHOL, HDL, LDLCALC, TRIG, CHOLHDL, LDLDIRECT in the last 72 hours.  Thyroid Function Tests: No results for input(s): TSH, T4TOTAL, FREET4, T3FREE, THYROIDAB in the last 72 hours.  Anemia Panel: No results for input(s): VITAMINB12, FOLATE, FERRITIN, TIBC, IRON, RETICCTPCT in the last 72 hours.  Urine analysis:    Component Value Date/Time   COLORURINE AMBER (A) 03/18/2020 2143   APPEARANCEUR HAZY (A) 03/18/2020 2143   APPEARANCEUR Clear 03/03/2019 0920   LABSPEC 1.008 03/18/2020 2143   PHURINE 6.0 03/18/2020 2143   GLUCOSEU NEGATIVE 03/18/2020 2143   HGBUR LARGE (A) 03/18/2020 2143   BILIRUBINUR NEGATIVE 03/18/2020 2143   BILIRUBINUR Negative 03/03/2019 0920   KETONESUR NEGATIVE 03/18/2020 2143   PROTEINUR 100 (A) 03/18/2020 2143   UROBILINOGEN negative 12/21/2012 1509   NITRITE NEGATIVE 03/18/2020 2143   LEUKOCYTESUR SMALL (A) 03/18/2020 2143    Sepsis Labs: Lactic Acid, Venous No results found for: LATICACIDVEN  MICROBIOLOGY: No results found for this or  any previous visit (from the past 240 hour(s)).  RADIOLOGY STUDIES/RESULTS: VAS [place] UPPER EXT VEIN MAPPING (PRE-OP AVF)  Result Date: 04/02/2020 UPPER EXTREMITY VEIN MAPPING  Indications: Pre-dialysis access. Comparison Study: No prior study on file Performing Technologist:  Sharion Dove RVS  Examination Guidelines: A complete evaluation includes B-mode imaging, spectral Doppler, color Doppler, and power Doppler as needed of all accessible portions of each vessel. Bilateral testing is considered an integral part of a complete examination. Limited examinations for reoccurring indications may be performed as noted. +-----------------+-------------+----------+------------------------------+ Right Cephalic   Diameter (cm)Depth (cm)           Findings            +-----------------+-------------+----------+------------------------------+ Prox upper arm       0.24      3655.00                                 +-----------------+-------------+----------+------------------------------+ Mid upper arm        0.30        0.44                                  +-----------------+-------------+----------+------------------------------+ Dist upper arm       0.22        0.54                                  +-----------------+-------------+----------+------------------------------+ Antecubital fossa    0.43        0.28                                  +-----------------+-------------+----------+------------------------------+ Prox forearm                            not visualized and bandages/IV +-----------------+-------------+----------+------------------------------+ Mid forearm          0.34        0.42                                  +-----------------+-------------+----------+------------------------------+ Wrist                0.34        0.33                                  +-----------------+-------------+----------+------------------------------+  +-----------------+-------------+----------+----------------+ Left Cephalic    Diameter (cm)Depth (cm)    Findings     +-----------------+-------------+----------+----------------+ Prox upper arm       0.25        0.71                    +-----------------+-------------+----------+----------------+ Mid upper arm        0.25        0.49                    +-----------------+-------------+----------+----------------+ Dist upper arm       0.24        0.71                    +-----------------+-------------+----------+----------------+ Antecubital fossa    0.33        0.27   partial thrombus +-----------------+-------------+----------+----------------+ Prox forearm         0.30  0.37      branching     +-----------------+-------------+----------+----------------+ Mid forearm          0.31        0.57                    +-----------------+-------------+----------+----------------+ Wrist                0.35        0.37                    +-----------------+-------------+----------+----------------+ +-----------------+-------------+----------+---------+ Left Basilic     Diameter (cm)Depth (cm)Findings  +-----------------+-------------+----------+---------+ Prox upper arm       0.43        0.97    origin   +-----------------+-------------+----------+---------+ Mid upper arm        0.44        0.90             +-----------------+-------------+----------+---------+ Dist upper arm       0.36        0.79   branching +-----------------+-------------+----------+---------+ Antecubital fossa    0.43        0.90             +-----------------+-------------+----------+---------+ Prox forearm         0.32        0.52             +-----------------+-------------+----------+---------+ Mid forearm          0.46        2.00             +-----------------+-------------+----------+---------+ Wrist                0.40        0.40              +-----------------+-------------+----------+---------+ *See table(s) above for measurements and observations.  Diagnosing physician: Harold Barban MD Electronically signed by Harold Barban MD on 04/02/2020 at 6:33:54 AM.    Final      LOS: 49 days   Oren Binet, MD  [facility]    To contact the attending provider between 7A-7P or the covering provider during after hours 7P-7A, please log into the web site [email or web address] and access using universal [place] password for that web site. If you do not have the password, please call the hospital operator.  04/02/2020, 1:30 PM

## 2020-04-02 NOTE — Op Note (Signed)
    OPERATIVE REPORT  DATE OF SURGERY: 04/02/2020  PATIENT: Jasmine White, 74 y.o. female MRN: [id number]  DOB: [birth year]  PRE-OPERATIVE DIAGNOSIS: End-stage renal disease  POST-OPERATIVE DIAGNOSIS:  Same  PROCEDURE: Left brachiocephalic AV fistula creation  SURGEON:  Curt Jews, M.D.  PHYSICIAN ASSISTANT: Arlee Muslim, PA-C  The assistant was needed for exposure and to expedite the case  ANESTHESIA: General  EBL: per anesthesia record  Total I/O In: 50 [IV Piggyback:50] Out: [place]: none  DRAINS: none  SPECIMEN: none  COUNTS CORRECT:  YES  PATIENT DISPOSITION:  PACU - hemodynamically stable  PROCEDURE DETAILS: The patient was taken to the operating placed supine position where the area of the left arm was prepped draped you sterile fashion.  SonoSite ultrasound was used to visualize the veins.  The cephalic and basilic vein were good caliber above the antecubital space.  Incision was made over the antecubital space and carried down to isolate the cephalic vein which was of good caliber.  Tributary branches were ligated with 3-0 and 4-0 silk ties and divided.  The vein was ligated distally and divided and was brought into approximation of the brachial artery.  The artery was of good size and had minimal atherosclerotic change.  The artery was occluded proximally and distally and was opened with an 11 blade and extended longitudinally with Potts scissors.  A small arteriotomy was created to reduce risk of steal.  The vein was cut to the appropriate length and was spatulated and sewn end-to-side to the artery with a running 6-0 Prolene suture.  Clamps removed and good thrill was noted.  The patient maintained a radial pulse.  The wounds were irrigated with saline.  Hemostasis obtained with cautery.  Wounds were closed with 3-0 Vicryl in the subcutaneous and subcuticular tissue.  Sterile dressing was applied and the patient was transferred to the  recovery room in stable condition   Rosetta Posner, M.D., [facility] 04/02/2020 5:06 PM

## 2020-04-02 NOTE — Anesthesia Preprocedure Evaluation (Addendum)
Anesthesia Evaluation  Patient identified by MRN, date of birth, ID band Patient awake    Reviewed: Allergy & Precautions, H&P , NPO status , Patient's Chart, lab work & pertinent test results, reviewed documented beta blocker date and time , Unable to perform ROS - Chart review only  History of Anesthesia Complications (+) PONV and history of anesthetic complications  Airway Mallampati: II  TM Distance: >3 FB Neck ROM: Full    Dental no notable dental hx. (+) Teeth Intact, Dental Advisory Given   Pulmonary Recent URI , Resolved,    Pulmonary exam normal breath sounds clear to auscultation       Cardiovascular hypertension, Pt. on medications and Pt. on home beta blockers Normal cardiovascular exam Rhythm:Regular Rate:Normal     Neuro/Psych PSYCHIATRIC DISORDERS Depression Chronic back pain    GI/Hepatic negative GI ROS, Neg liver ROS,   Endo/Other  Hypothyroidism (on replacement)   Renal/GU Renal disease     Musculoskeletal  (+) Arthritis ,   Abdominal   Peds  Hematology   Anesthesia Other Findings   Reproductive/Obstetrics                            Anesthesia Physical  Anesthesia Plan  ASA: III  Anesthesia Plan: General   Post-op Pain Management:    Induction: Intravenous  PONV Risk Score and Plan: 3 and Ondansetron, Dexamethasone and Treatment may vary due to age or medical condition  Airway Management Planned: LMA  Additional Equipment: None  Intra-op Plan:   Post-operative Plan: Extubation in OR  Informed Consent: I have reviewed the patients History and Physical, chart, labs and discussed the procedure including the risks, benefits and alternatives for the proposed anesthesia with the patient or authorized representative who has indicated his/her understanding and acceptance.     Dental advisory given  Plan Discussed with: CRNA  Anesthesia Plan Comments:         Anesthesia Quick Evaluation

## 2020-04-02 NOTE — Transfer of Care (Signed)
Immediate Anesthesia Transfer of Care Note  Patient: Jasmine White  Procedure(s) Performed: ARTERIOVENOUS (AV) FISTULA CREATION (Left Arm Lower)  Patient Location: PACU  Anesthesia Type:General  Level of Consciousness: lethargic  Airway & Oxygen Therapy: Patient Spontanous Breathing  Post-op Assessment: Report given to RN  Post vital signs: Reviewed and stable  Last Vitals:  Vitals Value Taken Time  BP 156/67 04/02/20 1715  Temp    Pulse 73 04/02/20 1716  Resp 14 04/02/20 1716  SpO2 97 % 04/02/20 1716  Vitals shown include unvalidated device data.  Last Pain:  Vitals:   04/02/20 1510  TempSrc: Oral  PainSc:       Patients Stated Pain Goal: 0 (37/48/27 0786)  Complications: No complications documented.

## 2020-04-02 NOTE — Progress Notes (Signed)
Patient has been accepted at [facility] on a MWF schedule with a seat time of 12:30pm. She needs to arrive to her appointments at 12:15pm. On her first day at the clinic, she needs to arrive at 11:30am. Renal Navigator notified Nephrologist/Dr. Johnney Ou and called patient's daughter to inform.  Alphonzo Cruise, [place] Renal Navigator [phone]

## 2020-04-02 NOTE — Progress Notes (Signed)
PT Cancellation Note  Patient Details Name: Jasmine White MRN: [id number] DOB: 03/03/1946   Cancelled Treatment:    Reason Eval/Treat Not Completed: Patient at procedure or test/unavailable. Pt currently in HD. Will check back as schedule allows to continue with PT POC.    Thelma Comp 04/02/2020, 8:31 AM   Rolinda Roan, PT, DPT Acute Rehabilitation Services Pager: [phone] Office: [phone]

## 2020-04-02 NOTE — Discharge Instructions (Signed)
° °  [facility] ° °Discharge Instructions ° °AV Fistula or Graft Surgery for Dialysis Access ° °Please refer to the following instructions for your post-procedure care. Your surgeon or physician assistant will discuss any changes with you. ° °Activity ° °You may drive the day following your surgery, if you are comfortable and no longer taking prescription pain medication. Resume full activity as the soreness in your incision resolves. ° °Bathing/Showering ° °You may shower after you go home. Keep your incision dry for 48 hours. Do not soak in a bathtub, hot tub, or swim until the incision heals completely. You may not shower if you have a hemodialysis catheter. ° °Incision Care ° °Clean your incision with mild soap and water after 48 hours. Pat the area dry with a clean towel. You do not need a bandage unless otherwise instructed. Do not apply any ointments or creams to your incision. You may have skin glue on your incision. Do not peel it off. It will come off on its own in about one week. Your arm may swell a bit after surgery. To reduce swelling use pillows to elevate your arm so it is above your heart. Your doctor will tell you if you need to lightly wrap your arm with an ACE bandage. ° °Diet ° °Resume your normal diet. There are not special food restrictions following this procedure. In order to heal from your surgery, it is CRITICAL to get adequate nutrition. Your body requires vitamins, minerals, and protein. Vegetables are the best source of vitamins and minerals. Vegetables also provide the perfect balance of protein. Processed food has little nutritional value, so try to avoid this. ° °Medications ° °Resume taking all of your medications. If your incision is causing pain, you may take over-the counter pain relievers such as acetaminophen (Tylenol). If you were prescribed a stronger pain medication, please be aware these medications can cause nausea and constipation. Prevent  nausea by taking the medication with a snack or meal. Avoid constipation by drinking plenty of fluids and eating foods with high amount of fiber, such as fruits, vegetables, and grains. Do not take Tylenol if you are taking prescription pain medications. ° ° ° ° °Follow up °Your surgeon may want to see you in the office following your access surgery. If so, this will be arranged at the time of your surgery. ° °Please call us immediately for any of the following conditions: ° °Increased pain, redness, drainage (pus) from your incision site °Fever of 101 degrees or higher °Severe or worsening pain at your incision site °Hand pain or numbness. ° °Reduce your risk of vascular disease: ° °Stop smoking. If you would like help, call [facility] at 1-800-QUIT-NOW (1-800-784-8669) or [place] at 336-586-4000 ° °Manage your cholesterol °Maintain a desired weight °Control your diabetes °Keep your blood pressure down ° °Dialysis ° °It will take several weeks to several months for your new dialysis access to be ready for use. Your surgeon will determine when it is OK to use it. Your nephrologist will continue to direct your dialysis. You can continue to use your Permcath until your new access is ready for use. ° °If you have any questions, please call the office at [phone]. ° °

## 2020-04-03 ENCOUNTER — Encounter (HOSPITAL_COMMUNITY): Payer: Self-pay | Admitting: Vascular Surgery

## 2020-04-03 DIAGNOSIS — L299 Pruritus, unspecified: Secondary | ICD-10-CM | POA: Insufficient documentation

## 2020-04-03 DIAGNOSIS — R351 Nocturia: Secondary | ICD-10-CM | POA: Insufficient documentation

## 2020-04-03 DIAGNOSIS — D689 Coagulation defect, unspecified: Secondary | ICD-10-CM | POA: Insufficient documentation

## 2020-04-03 DIAGNOSIS — N2581 Secondary hyperparathyroidism of renal origin: Secondary | ICD-10-CM | POA: Insufficient documentation

## 2020-04-03 DIAGNOSIS — R0600 Dyspnea, unspecified: Secondary | ICD-10-CM | POA: Insufficient documentation

## 2020-04-03 DIAGNOSIS — D649 Anemia, unspecified: Secondary | ICD-10-CM | POA: Insufficient documentation

## 2020-04-03 DIAGNOSIS — Z23 Encounter for immunization: Secondary | ICD-10-CM | POA: Insufficient documentation

## 2020-04-03 DIAGNOSIS — M1389 Other specified arthritis, multiple sites: Secondary | ICD-10-CM | POA: Insufficient documentation

## 2020-04-03 DIAGNOSIS — D509 Iron deficiency anemia, unspecified: Secondary | ICD-10-CM | POA: Insufficient documentation

## 2020-04-03 DIAGNOSIS — R197 Diarrhea, unspecified: Secondary | ICD-10-CM | POA: Insufficient documentation

## 2020-04-03 LAB — CBC
HCT: 21.1 % — ABNORMAL LOW (ref 36.0–46.0)
Hemoglobin: 6.7 g/dL — CL (ref 12.0–15.0)
MCH: 29.4 pg (ref 26.0–34.0)
MCHC: 31.8 g/dL (ref 30.0–36.0)
MCV: 92.5 fL (ref 80.0–100.0)
Platelets: 128 10*3/uL — ABNORMAL LOW (ref 150–400)
RBC: 2.28 MIL/uL — ABNORMAL LOW (ref 3.87–5.11)
RDW: 16.9 % — ABNORMAL HIGH (ref 11.5–15.5)
WBC: 7.2 10*3/uL (ref 4.0–10.5)
nRBC: 0 % (ref 0.0–0.2)

## 2020-04-03 LAB — THERAPEUTIC PLASMA EXCHANGE (BLOOD BANK)
Plasma volume needed: 2900
Unit division: 0
Unit division: 0
Unit division: 0
Unit division: 0
Unit division: 0
Unit division: 0
Unit division: 0
Unit division: 0
Unit division: 0

## 2020-04-03 LAB — RENAL FUNCTION PANEL
Albumin: 2.8 g/dL — ABNORMAL LOW (ref 3.5–5.0)
Anion gap: 11 (ref 5–15)
BUN: 22 mg/dL (ref 8–23)
CO2: 30 mmol/L (ref 22–32)
Calcium: 8 mg/dL — ABNORMAL LOW (ref 8.9–10.3)
Chloride: 96 mmol/L — ABNORMAL LOW (ref 98–111)
Creatinine, Ser: 3.52 mg/dL — ABNORMAL HIGH (ref 0.44–1.00)
GFR calc Af Amer: 14 mL/min — ABNORMAL LOW (ref >60)
GFR calc non Af Amer: 12 mL/min — ABNORMAL LOW (ref >60)
Glucose, Bld: 87 mg/dL (ref 70–99)
Phosphorus: 3.8 mg/dL (ref 2.5–4.6)
Potassium: 4.6 mmol/L (ref 3.5–5.1)
Sodium: 137 mmol/L (ref 135–145)

## 2020-04-03 LAB — POCT I-STAT, CHEM 8
BUN: 30 mg/dL — ABNORMAL HIGH (ref 8–23)
Calcium, Ion: 1.01 mmol/L — ABNORMAL LOW (ref 1.15–1.40)
Chloride: 93 mmol/L — ABNORMAL LOW (ref 98–111)
Creatinine, Ser: 4 mg/dL — ABNORMAL HIGH (ref 0.44–1.00)
Glucose, Bld: 207 mg/dL — ABNORMAL HIGH (ref 70–99)
HCT: 43 % (ref 36.0–46.0)
Hemoglobin: 14.6 g/dL (ref 12.0–15.0)
Potassium: 4.7 mmol/L (ref 3.5–5.1)
Sodium: 136 mmol/L (ref 135–145)
TCO2: 26 mmol/L (ref 22–32)

## 2020-04-03 LAB — HEMOGLOBIN AND HEMATOCRIT, BLOOD
HCT: 25.8 % — ABNORMAL LOW (ref 36.0–46.0)
Hemoglobin: 8.1 g/dL — ABNORMAL LOW (ref 12.0–15.0)

## 2020-04-03 LAB — PREPARE RBC (CROSSMATCH)

## 2020-04-03 MED ORDER — DARBEPOETIN ALFA 100 MCG/0.5ML IJ SOSY
100.0000 ug | PREFILLED_SYRINGE | INTRAMUSCULAR | Status: DC
  Filled 2020-04-03: qty 0.5

## 2020-04-03 MED ORDER — ANTICOAGULANT SODIUM CITRATE 4% (200MG/5ML) IV SOLN
5.0000 mL | Freq: Once | Status: DC
  Filled 2020-04-03 (×2): qty 5

## 2020-04-03 MED ORDER — DIPHENHYDRAMINE HCL 25 MG PO CAPS: 25.0000 mg | ORAL_CAPSULE | Freq: Four times a day (QID) | ORAL | Status: DC | PRN

## 2020-04-03 MED ORDER — ACD FORMULA A 0.73-2.45-2.2 GM/100ML VI SOLN
1000.0000 mL | Status: DC
  Filled 2020-04-03: qty 1000

## 2020-04-03 MED ORDER — CALCIUM GLUCONATE-NACL 2-0.675 GM/100ML-% IV SOLN
2.0000 g | Freq: Once | INTRAVENOUS | Status: AC
  Administered 2020-04-03: 2000 mg via INTRAVENOUS
  Filled 2020-04-03: qty 100

## 2020-04-03 MED ORDER — ACD FORMULA A 0.73-2.45-2.2 GM/100ML VI SOLN
Status: AC
  Filled 2020-04-03: qty 500

## 2020-04-03 MED ORDER — CALCIUM CARBONATE ANTACID 500 MG PO CHEW
2.0000 | CHEWABLE_TABLET | ORAL | Status: AC
  Administered 2020-04-03: 400 mg via ORAL
  Filled 2020-04-03: qty 2

## 2020-04-03 MED ORDER — DIPHENHYDRAMINE HCL 25 MG PO CAPS
ORAL_CAPSULE | ORAL | Status: AC
  Administered 2020-04-03: 25 mg via ORAL
  Filled 2020-04-03: qty 1

## 2020-04-03 MED ORDER — CALCIUM CARBONATE ANTACID 500 MG PO CHEW
CHEWABLE_TABLET | ORAL | Status: AC
  Administered 2020-04-03: 400 mg via ORAL
  Filled 2020-04-03: qty 4

## 2020-04-03 MED ORDER — ACETAMINOPHEN 325 MG PO TABS
650.0000 mg | ORAL_TABLET | ORAL | Status: DC | PRN
  Administered 2020-04-03: 650 mg via ORAL
  Filled 2020-04-03: qty 2

## 2020-04-03 MED ORDER — SODIUM CHLORIDE 0.9% IV SOLUTION: Freq: Once | INTRAVENOUS | Status: DC

## 2020-04-03 NOTE — Progress Notes (Signed)
PT Cancellation Note  Patient Details Name: Jasmine White MRN: [id number] DOB: 1946-05-08   Cancelled Treatment:    Reason Eval/Treat Not Completed: Patient at procedure or test/unavailable.  Pt and bed not in room.  PT will check back as time allows.  Thanks,  Verdene Lennert, PT, DPT  Acute Rehabilitation [phone] pager #[phone] office       [name] 04/03/2020, 3:08 PM

## 2020-04-03 NOTE — Progress Notes (Signed)
  Progress Note    04/03/2020 7:37 AM 1 Day Post-Op  Subjective: no complaints this morning. Says that her arm feels fine. No significant discomfort. Say she has been elevating her left arm on pillow as instructed   Vitals:   04/03/20 0634 04/03/20 0714  BP: (!) 166/69 (!) 145/59  Pulse: 89 89  Resp:    Temp: 98.8 F (37.1 C) 98.2 F (36.8 C)  SpO2: 96% 94%   Physical Exam: Cardiac: regular Lungs: non labored Incisions:  Left AC incision clean, dry and intact. Dermabond skin adhesive present. No surrounding erythema or ecchymosis. No swelling or hematoma  Extremities: left radial pulse 2+, left brachial pulse 2+. Left arm without swelling. Grip strength 5/5. Left hand warm Neurologic: alert and oriented  CBC    Component Value Date/Time   WBC 7.2 04/03/2020 0454   RBC 2.28 (L) 04/03/2020 0454   HGB 6.7 (LL) 04/03/2020 0454   HGB 9.7 (L) 03/01/2020 0927   HCT 21.1 (L) 04/03/2020 0454   HCT 28.8 (L) 03/01/2020 0927   PLT 128 (L) 04/03/2020 0454   PLT 397 03/01/2020 0927   MCV 92.5 04/03/2020 0454   MCV 79 03/01/2020 0927   MCH 29.4 04/03/2020 0454   MCHC 31.8 04/03/2020 0454   RDW 16.9 (H) 04/03/2020 0454   RDW 14.7 03/01/2020 0927   LYMPHSABS 0.3 (L) 03/22/2020 0103   LYMPHSABS 1.3 03/01/2020 0927   MONOABS 0.3 03/22/2020 0103   EOSABS 0.0 03/22/2020 0103   EOSABS 0.2 03/01/2020 0927   BASOSABS 0.0 03/22/2020 0103   BASOSABS 0.1 03/01/2020 0927    BMET    Component Value Date/Time   NA 137 04/03/2020 0454   NA 128 (L) 03/01/2020 0927   K 4.6 04/03/2020 0454   CL 96 (L) 04/03/2020 0454   CO2 30 04/03/2020 0454   GLUCOSE 87 04/03/2020 0454   BUN 22 04/03/2020 0454   BUN 67 (H) 03/01/2020 0927   CREATININE 3.52 (H) 04/03/2020 0454   CREATININE 0.84 12/21/2012 1552   CALCIUM 8.0 (L) 04/03/2020 0454   GFRNONAA 12 (L) 04/03/2020 0454   GFRNONAA 72 12/21/2012 1552   GFRAA 14 (L) 04/03/2020 0454   GFRAA 83 12/21/2012 1552    INR    Component Value  Date/Time   INR 1.0 04/02/2020 0310     Intake/Output Summary (Last 24 hours) at 04/03/2020 0737 Last data filed at 04/02/2020 1700 Gross per 24 hour  Intake 400 ml  Output 3500 ml  Net -3100 ml     Assessment/Plan:  74 y.o. female is s/p Left brachiocephalic AV fistula 1 Day Post-Op. Doing well post op. Hgb 6.7 this morning. She is being transfused with 1 unit of PRBC. Left upper extremity incision clean, dry and intact. No swelling or hematoma. Palpable thrill. No steal symptoms. Elevate LUE as needed. Medical management per primary team. She will have follow up in our office in 4-6 weeks with a fistula Duplex    Jasmine Caldwell, PA-C [facility] [phone] 04/03/2020 7:37 AM

## 2020-04-03 NOTE — Progress Notes (Addendum)
PROGRESS NOTE        PATIENT DETAILS Name: Jasmine White Age: 74 y.o. Sex: female Date of Birth: 02-25-1946 Admit Date: 03/17/2020 Admitting Physician Clance Boll, MD ION:GEXBMW, [name], FNP  Brief Narrative: Patient is a 74 y.o. female with history of HTN, hypothyroidism, HLD, recently diagnosed by PCP with renal dysfunction, history of COVID-19 infection in November 2020-since then been having persistent fatigue-who presented with persistent nausea/vomiting-further evaluation revealed severe hyponatremia with a sodium of 109, worsening renal function with a creatinine of 7.12- she was admitted-provided supportive care-subsequently nephrology was consulted-she underwent renal biopsy-which showed 100% crescents and significant amount of ATN.  She was subsequently started on plasmapheresis, Solu-Medrol/Cytoxan and also on hemodialysis.  See below for further details.  Significant events: 6/26>> admit to [facility] for severe hyponatremia, AKI  Significant studies: 6/26>> CT abdomen pelvis: Large volume of inspissated fecal material throughout the colon, moderately distended urinary bladder, cholelithiasis 6/27>> RUQ ultrasound: Cholelithiasis without evidence of cholecystitis 7/1>> VQ scan: Pulmonary embolism absent 7/1>> bilateral lower extremity Doppler: No DVT 7/3>> CT chest: Bilateral areas of mosaic groundglass and consolidative opacity-are nonspecific-in the setting of anti-GBM antibodies very early/developing alveolar hemorrhage could present with similar appearance.  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  Remains on steroids, Cytoxan and Bactrim MWF for PCP prophylaxis.  Due to neurological side effects/hyperactivity-steroids doses being stable tapered-seems to be tolerating 40 mg of prednisone relatively well.  His VVS following-s/p left AV fistula creation on 7/12.  Alveolar hemorrhage: No hemoptysis-not hypoxic-see CT chest above-started on plasmapheresis daily plasmapheresis x2 weeks (on 11/14 days).    Elevated ANA/dsDNA levels: Not sure if as lupus-in any event-already on appropriate treatment.  Stable for outpatient rheumatology follow-up.  Acute encephalopathy: Secondary to steroids-has improved-steroids being slowly tapered down-seems to be relatively stable on 40 mg of prednisone.  Continue to follow daily.  Hyponatremia: Secondary to hypovolemia/HCTZ use-and AKI-resolved with supportive care.  Normocytic anemia: Probably related to CKD/immunosuppressive agents-we will require another unit (total of 3 units so far) .  SPEP-without M spike.    Constipation: Resolved.  Hypothyroidism: Continue Synthroid-TSH stable.  Deconditioning: Rehab services following  Diet: Diet Order            Diet renal with fluid restriction Fluid restriction: 1200 mL Fluid; Room service appropriate? Yes; Fluid consistency: Thin  Diet effective now                  Code Status: Full code   Family Communication: Daughter Jasmine White over the phone on 7/13  Disposition Plan: Status is: Inpatient  Remains inpatient appropriate because:Inpatient level of care appropriate due to  severity of illness  Dispo: The patient is from: Home  Anticipated d/c is to: Home              Anticipated d/c date is: > 3 days              Patient currently is not medically stable to d/c.  Barriers to Discharge: Requires daily plasmapheresis x2 weeks total (on 11/14 days)-before discharge with outpatient HD.  Antimicrobial agents: Anti-infectives (From admission, onward)   Start     Dose/Rate Route Frequency Ordered Stop   04/02/20 0700  ceFAZolin (ANCEF) IVPB 1 g/50 mL premix       Note to Pharmacy: Send with pt to OR   1 g 100 mL/hr over 30 Minutes Intravenous To [facility] 04/01/20 2111 04/02/20 1607   03/26/20 1000  sulfamethoxazole-trimethoprim (BACTRIM) 400-80 MG per tablet 1 tablet     Discontinue     1 tablet Oral Every M-W-F 03/25/20 0954     03/25/20 2200  sulfamethoxazole-trimethoprim (BACTRIM) 400-80 MG per tablet 1 tablet  Status:  Discontinued        1 tablet Oral Daily at bedtime 03/25/20 0854 03/25/20 0921   03/20/20 1319  ceFAZolin (ANCEF) 2-4 GM/100ML-% IVPB       Note to Pharmacy: Arlean Hopping   : cabinet override      03/20/20 1319 03/21/20 0129   03/20/20 1100  ceFAZolin (ANCEF) IVPB 2g/100 mL premix        2 g 200 mL/hr over 30 Minutes Intravenous On call 03/20/20 1040 03/20/20 1506   03/19/20 0000  cefTRIAXone (ROCEPHIN) 1 g in sodium chloride 0.9 % 100 mL IVPB       Note to Pharmacy: 1st dose now   1 g 200 mL/hr over 30 Minutes Intravenous Every 24 hours 03/18/20 0127 03/21/20 0008   03/18/20 0145  cefTRIAXone (ROCEPHIN) 1 g in sodium chloride 0.9 % 100 mL IVPB       Note to Pharmacy: 1st dose now   1 g 200 mL/hr over 30 Minutes Intravenous  Once 03/18/20 0135 03/18/20 0252       Time spent: 25- minutes-Greater than 50% of this time was spent in counseling, explanation of diagnosis, planning of further management, and coordination of care.  MEDICATIONS: Scheduled Meds: . sodium chloride   Intravenous Once  . sodium  chloride   Intravenous Once  . amLODipine  10 mg Oral Daily  . calcium carbonate      . calcium carbonate  2 tablet Oral Q3H  . Chlorhexidine Gluconate Cloth  6 each Topical Q0600  . cholecalciferol  1,000 Units Oral Daily  . cyclophosphamide  50 mg Oral q1800  . [START ON 04/06/2020] darbepoetin (ARANESP) injection - DIALYSIS  100 mcg Intravenous Q Fri-HD  . diphenhydrAMINE      . docusate sodium  200 mg Oral BID  . heparin injection (subcutaneous)  5,000 Units Subcutaneous Q8H  . hydrALAZINE  100 mg Oral TID  . levothyroxine  88 mcg Oral Q0600  . metoprolol tartrate  100 mg Oral BID  . pantoprazole  40 mg Oral Daily  . polyethylene glycol  17 g Oral BID  . predniSONE  40 mg Oral Q breakfast  . sulfamethoxazole-trimethoprim  1 tablet Oral Q M,W,F   Continuous Infusions: . anticoagulant sodium citrate    . calcium gluconate    . citrate dextrose    . citrate dextrose     PRN Meds:.acetaminophen, albuterol, diphenhydrAMINE, HYDROcodone-acetaminophen, [DISCONTINUED] ondansetron **OR** ondansetron (ZOFRAN) IV, technetium albumin aggregated   PHYSICAL EXAM: Vital  signs: Vitals:   04/03/20 0714 04/03/20 0924 04/03/20 1323 04/03/20 1325  BP: (!) 145/59 (!) 139/52 (!) 167/76   Pulse: 89 92  89  Resp:   17   Temp: 98.2 F (36.8 C) 98.1 F (36.7 C)  98.1 F (36.7 C)  TempSrc: Oral Oral  Oral  SpO2: 94% 99% 94%   Weight:      Height:       Filed Weights   04/02/20 0454 04/02/20 0709 04/03/20 0323  Weight: 69.7 kg 67.6 kg 71.7 kg   Body mass index is 25.53 kg/m.   Gen Exam:Alert awake-not in any distress HEENT:atraumatic, normocephalic Chest: B/L clear to auscultation anteriorly CVS:S1S2 regular Abdomen:soft non tender, non distended Extremities:no edema Neurology: Non focal Skin: no rash  I have personally reviewed following labs and imaging studies  LABORATORY DATA: CBC: Recent Labs  Lab 03/30/20 0753 03/30/20 0753 03/31/20 0315 03/31/20 0315 04/01/20 0715  04/02/20 0310 04/02/20 1543 04/03/20 0454 04/03/20 1202  WBC 15.7*  --  8.5  --  9.1 11.1*  --  7.2  --   HGB 7.1*   < > 6.0*   < > 7.4* 7.4* 7.5* 6.7* 8.1*  HCT 22.3*   < > 18.9*   < > 23.2* 23.0* 22.0* 21.1* 25.8*  MCV 89.6  --  88.3  --  87.5 88.8  --  92.5  --   PLT 146*  --  123*  --  124* 133*  --  128*  --    < > = values in this interval not displayed.    Basic Metabolic Panel: Recent Labs  Lab 03/30/20 0752 03/30/20 0752 03/31/20 0315 03/31/20 0315 03/31/20 1431 04/01/20 0715 04/02/20 0310 04/02/20 1543 04/03/20 0454  NA 133*   < > 138   < > 137 138 139 140 137  K 3.3*   < > 3.9   < > 3.9 3.7 3.7 4.3 4.6  CL 90*   < > 95*   < > 94* 93* 94* 95* 96*  CO2 31   < > 34*  --  30 33* 31  --  30  GLUCOSE 95   < > 93   < > 217* 96 91 98 87  BUN 38*   < > 18   < > 28* 38* 51* 15 22  CREATININE 4.50*   < > 2.68*   < > 3.77* 4.87* 6.26* 2.20* 3.52*  CALCIUM 8.8*   < > 7.9*  --  8.0* 8.5* 8.7*  --  8.0*  PHOS 4.1  --  3.0  --   --  4.2 4.4  --  3.8   < > = values in this interval not displayed.    GFR: Estimated Creatinine Clearance: 14.2 mL/min (A) (by C-G formula based on SCr of 3.52 mg/dL (H)).  Liver Function Tests: Recent Labs  Lab 03/30/20 0752 03/31/20 0315 04/01/20 0715 04/02/20 0310 04/03/20 0454  ALBUMIN 2.9* 2.5* 2.8* 2.9* 2.8*   No results for input(s): LIPASE, AMYLASE in the last 168 hours. No results for input(s): AMMONIA in the last 168 hours.  Coagulation Profile: Recent Labs  Lab 04/02/20 0310  INR 1.0    Cardiac Enzymes: No results for input(s): CKTOTAL, CKMB, CKMBINDEX, TROPONINI in the last 168 hours.  BNP (last 3 results) No results for input(s): PROBNP in the last 8760 hours.  Lipid Profile: No results for input(s): CHOL, HDL, LDLCALC, TRIG, CHOLHDL, LDLDIRECT in the last 72 hours.  Thyroid Function  Tests: No results for input(s): TSH, T4TOTAL, FREET4, T3FREE, THYROIDAB in the last 72 hours.  Anemia Panel: No results for  input(s): VITAMINB12, FOLATE, FERRITIN, TIBC, IRON, RETICCTPCT in the last 72 hours.  Urine analysis:    Component Value Date/Time   COLORURINE AMBER (A) 03/18/2020 2143   APPEARANCEUR HAZY (A) 03/18/2020 2143   APPEARANCEUR Clear 03/03/2019 0920   LABSPEC 1.008 03/18/2020 2143   PHURINE 6.0 03/18/2020 2143   GLUCOSEU NEGATIVE 03/18/2020 2143   HGBUR LARGE (A) 03/18/2020 2143   BILIRUBINUR NEGATIVE 03/18/2020 2143   BILIRUBINUR Negative 03/03/2019 0920   KETONESUR NEGATIVE 03/18/2020 2143   PROTEINUR 100 (A) 03/18/2020 2143   UROBILINOGEN negative 12/21/2012 1509   NITRITE NEGATIVE 03/18/2020 2143   LEUKOCYTESUR SMALL (A) 03/18/2020 2143    Sepsis Labs: Lactic Acid, Venous No results found for: LATICACIDVEN  MICROBIOLOGY: No results found for this or any previous visit (from the past 240 hour(s)).  RADIOLOGY STUDIES/RESULTS: No results found.   LOS: 17 days   Oren Binet, MD  [facility]    To contact the attending provider between 7A-7P or the covering provider during after hours 7P-7A, please log into the web site [email or web address] and access using universal [place] password for that web site. If you do not have the password, please call the hospital operator.  04/03/2020, 1:38 PM

## 2020-04-03 NOTE — Progress Notes (Signed)
CRITICAL VALUE STICKER  CRITICAL VALUE: Hgb 6.7   RECEIVER (on-site recipient of call): Kyra Laffey, rn  DATE & TIME NOTIFIED: 04/03/20 @ [place] (representative from lab):  MD NOTIFIED: Dr.Opyd   TIME OF NOTIFICATION: 0609  RESPONSE:

## 2020-04-03 NOTE — Progress Notes (Signed)
Patient ID: Jasmine White, female   DOB: 01-Apr-1946, 74 y.o.   MRN: [id number] S: s/p AVF creation yesterday, HD and PLEX.  Feeling ok this AM.  No other new issues   O:BP (!) 139/52   Pulse 92   Temp 98.1 F (36.7 C) (Oral)   Resp 18   Ht 5' 5.98" (1.676 m)   Wt 71.7 kg   SpO2 99%   BMI 25.53 kg/m   Intake/Output Summary (Last 24 hours) at 04/03/2020 0934 Last data filed at 04/03/2020 9675 Gross per 24 hour  Intake 640 ml  Output 3500 ml  Net -2860 ml   Intake/Output: I/O last 3 completed shifts: In: 400 [I.V.:350; IV Piggyback:50] Out: 9163 [Urine:1; Other:3500; Stool:1]  Intake/Output this shift:  Total I/O In: 240 [P.O.:240] Out: -  Weight change: -2.1 kg Gen: NAD CVS: no rub Resp: normal WOB on RA; clear ant Abd:+BS, soft, nt/nd Ext: no edema, LUE AVF incision c/d/i with faint bruit and thrill, 2+ radial pulse and hand warm  Recent Labs  Lab 03/28/20 0757 03/28/20 0757 03/29/20 1155 03/29/20 1155 03/30/20 0752 03/31/20 0315 03/31/20 1431 04/01/20 0715 04/02/20 0310 04/02/20 1543 04/03/20 0454  NA 136   < > 135   < > 133* 138 137 138 139 140 137  K 3.2*   < > 3.5   < > 3.3* 3.9 3.9 3.7 3.7 4.3 4.6  CL 92*   < > 95*   < > 90* 95* 94* 93* 94* 95* 96*  CO2 28   < > 28  --  31 34* 30 33* 31  --  30  GLUCOSE 205*   < > 137*   < > 95 93 217* 96 91 98 87  BUN 49*   < > 27*   < > 38* 18 28* 38* 51* 15 22  CREATININE 5.00*   < > 3.61*   < > 4.50* 2.68* 3.77* 4.87* 6.26* 2.20* 3.52*  ALBUMIN 2.9*  --  2.8*  --  2.9* 2.5*  --  2.8* 2.9*  --  2.8*  CALCIUM 8.6*   < > 8.4*  --  8.8* 7.9* 8.0* 8.5* 8.7*  --  8.0*  PHOS 3.5  --  2.5  --  4.1 3.0  --  4.2 4.4  --  3.8   < > = values in this interval not displayed.   Liver Function Tests: Recent Labs  Lab 04/01/20 0715 04/02/20 0310 04/03/20 0454  ALBUMIN 2.8* 2.9* 2.8*   No results for input(s): LIPASE, AMYLASE in the last 168 hours. No results for input(s): AMMONIA in the last 168 hours. CBC: Recent Labs   Lab 03/30/20 0753 03/30/20 0753 03/31/20 0315 03/31/20 0315 04/01/20 0715 04/01/20 0715 04/02/20 0310 04/02/20 1543 04/03/20 0454  WBC 15.7*   < > 8.5   < > 9.1  --  11.1*  --  7.2  HGB 7.1*   < > 6.0*   < > 7.4*   < > 7.4* 7.5* 6.7*  HCT 22.3*   < > 18.9*   < > 23.2*   < > 23.0* 22.0* 21.1*  MCV 89.6  --  88.3  --  87.5  --  88.8  --  92.5  PLT 146*   < > 123*   < > 124*  --  133*  --  128*   < > = values in this interval not displayed.   Cardiac Enzymes: No results for input(s): CKTOTAL, CKMB, CKMBINDEX,  TROPONINI in the last 168 hours. CBG: No results for input(s): GLUCAP in the last 168 hours.  Iron Studies: No results for input(s): IRON, TIBC, TRANSFERRIN, FERRITIN in the last 72 hours. Studies/Results: VAS [place] UPPER EXT VEIN MAPPING (PRE-OP AVF)  Result Date: 04/02/2020 UPPER EXTREMITY VEIN MAPPING  Indications: Pre-dialysis access. Comparison Study: No prior study on file Performing Technologist: Sharion Dove RVS  Examination Guidelines: A complete evaluation includes B-mode imaging, spectral Doppler, color Doppler, and power Doppler as needed of all accessible portions of each vessel. Bilateral testing is considered an integral part of a complete examination. Limited examinations for reoccurring indications may be performed as noted. +-----------------+-------------+----------+------------------------------+ Right Cephalic   Diameter (cm)Depth (cm)           Findings            +-----------------+-------------+----------+------------------------------+ Prox upper arm       0.24      3655.00                                 +-----------------+-------------+----------+------------------------------+ Mid upper arm        0.30        0.44                                  +-----------------+-------------+----------+------------------------------+ Dist upper arm       0.22        0.54                                   +-----------------+-------------+----------+------------------------------+ Antecubital fossa    0.43        0.28                                  +-----------------+-------------+----------+------------------------------+ Prox forearm                            not visualized and bandages/IV +-----------------+-------------+----------+------------------------------+ Mid forearm          0.34        0.42                                  +-----------------+-------------+----------+------------------------------+ Wrist                0.34        0.33                                  +-----------------+-------------+----------+------------------------------+ +-----------------+-------------+----------+----------------+ Left Cephalic    Diameter (cm)Depth (cm)    Findings     +-----------------+-------------+----------+----------------+ Prox upper arm       0.25        0.71                    +-----------------+-------------+----------+----------------+ Mid upper arm        0.25        0.49                    +-----------------+-------------+----------+----------------+ Dist upper arm       0.24  0.71                    +-----------------+-------------+----------+----------------+ Antecubital fossa    0.33        0.27   partial thrombus +-----------------+-------------+----------+----------------+ Prox forearm         0.30        0.37      branching     +-----------------+-------------+----------+----------------+ Mid forearm          0.31        0.57                    +-----------------+-------------+----------+----------------+ Wrist                0.35        0.37                    +-----------------+-------------+----------+----------------+ +-----------------+-------------+----------+---------+ Left Basilic     Diameter (cm)Depth (cm)Findings  +-----------------+-------------+----------+---------+ Prox upper arm       0.43        0.97     origin   +-----------------+-------------+----------+---------+ Mid upper arm        0.44        0.90             +-----------------+-------------+----------+---------+ Dist upper arm       0.36        0.79   branching +-----------------+-------------+----------+---------+ Antecubital fossa    0.43        0.90             +-----------------+-------------+----------+---------+ Prox forearm         0.32        0.52             +-----------------+-------------+----------+---------+ Mid forearm          0.46        2.00             +-----------------+-------------+----------+---------+ Wrist                0.40        0.40             +-----------------+-------------+----------+---------+ *See table(s) above for measurements and observations.  Diagnosing physician: Harold Barban MD Electronically signed by Harold Barban MD on 04/02/2020 at 6:33:54 AM.    Final    . sodium chloride   Intravenous Once  . sodium chloride   Intravenous Once  . amLODipine  10 mg Oral Daily  . Chlorhexidine Gluconate Cloth  6 each Topical Q0600  . cholecalciferol  1,000 Units Oral Daily  . cyclophosphamide  50 mg Oral q1800  . [START ON 04/06/2020] darbepoetin (ARANESP) injection - DIALYSIS  40 mcg Intravenous Q Fri-HD  . docusate sodium  200 mg Oral BID  . heparin injection (subcutaneous)  5,000 Units Subcutaneous Q8H  . hydrALAZINE  100 mg Oral TID  . levothyroxine  88 mcg Oral Q0600  . metoprolol tartrate  100 mg Oral BID  . pantoprazole  40 mg Oral Daily  . polyethylene glycol  17 g Oral BID  . predniSONE  40 mg Oral Q breakfast  . sulfamethoxazole-trimethoprim  1 tablet Oral Q M,W,F    BMET    Component Value Date/Time   NA 137 04/03/2020 0454   NA 128 (L) 03/01/2020 0927   K 4.6 04/03/2020 0454   CL 96 (L) 04/03/2020 0454   CO2 30 04/03/2020 0454   GLUCOSE 87 04/03/2020 0454  BUN 22 04/03/2020 0454   BUN 67 (H) 03/01/2020 0927   CREATININE 3.52 (H) 04/03/2020 0454    CREATININE 0.84 12/21/2012 1552   CALCIUM 8.0 (L) 04/03/2020 0454   GFRNONAA 12 (L) 04/03/2020 0454   GFRNONAA 72 12/21/2012 1552   GFRAA 14 (L) 04/03/2020 0454   GFRAA 83 12/21/2012 1552   CBC    Component Value Date/Time   WBC 7.2 04/03/2020 0454   RBC 2.28 (L) 04/03/2020 0454   HGB 6.7 (LL) 04/03/2020 0454   HGB 9.7 (L) 03/01/2020 0927   HCT 21.1 (L) 04/03/2020 0454   HCT 28.8 (L) 03/01/2020 0927   PLT 128 (L) 04/03/2020 0454   PLT 397 03/01/2020 0927   MCV 92.5 04/03/2020 0454   MCV 79 03/01/2020 0927   MCH 29.4 04/03/2020 0454   MCHC 31.8 04/03/2020 0454   RDW 16.9 (H) 04/03/2020 0454   RDW 14.7 03/01/2020 0927   LYMPHSABS 0.3 (L) 03/22/2020 0103   LYMPHSABS 1.3 03/01/2020 0927   MONOABS 0.3 03/22/2020 0103   EOSABS 0.0 03/22/2020 0103   EOSABS 0.2 03/01/2020 0927   BASOSABS 0.0 03/22/2020 0103   BASOSABS 0.1 03/01/2020 0927    Assessment/Plan:  1. 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 Started on daily plasmapheresis and IV solumedrol/po cytoxan. Requiring HD.  1. Biopsy with 100% crescents and significant amount of ATN. 2. [place] continue with HD on MWF schedule for now. 3. For pheresis with FFP due to possible alveolar hemorrhagedaily.   4. Continue cytoxan 50 mg daily 5. Developed agitation with high dose pred so it was decreased to 40mg  daily, cont for now.  6. She has completed10 out of 14 sessions ofpheresis; resent GBM Ab this AM.  7. Bactrim MWF for PCP prophylaxis 8. Continue to follow for renal recovery, however doubtful given extensive crescents on biopsy.  9. MWF HD arranged at [facility] 2. HTN- follow with HD and UF --> was normotensive this AM.  3. Vascular access- RIJ TDC placed 03/20/20 by IR.   VVS placed BC AVF 04/02/20. 4. Thrombocytopenia - noted modest thrombocytopenia in last few days - stable but noted; CTM.  5. Anemia - Hb 6.7 this AM, no reported bleeding.  1u pRBC transfused, iron replete.  ^ ESA for next dose Friday. 6. Disposition- outpt HD at [facility] MWF arranged, will remain inpatient to complete 14 plasmapheresis sessions prior to discharge.  Jannifer Hick MD [facility] Pager [phone]

## 2020-04-03 NOTE — Progress Notes (Signed)
Occupational Therapy Treatment Patient Details Name: Jasmine White MRN: [id number] DOB: 04/13/1946 Today's Date: 04/03/2020    History of present illness 74 y.o. female with medical history significant of chronic back pain, HTN, hypothyroidism, recently diagnosed CKD, HLD, Hx of COVID infection and recovery with persistent fatigue who presented 03/18/20 with nausea/vomiting x 9 days. Severe hypovolemic hyponatremia, AKI, UTI, HTN   OT comments  Patient supine in bed on arrival with L arm propped on pillow.  Patient feeling very fatigued and H/H critally low at 6.6 so session remained at bed level.  Patient able to complete grooming with set up at bed level.  Her attention is poor and she is easily distracted, bouncing from topic to topic.  Will continue to work on increasing activity tolerance and standing ADLs once more medically stable, as well as cognition to improve safety.   Follow Up Recommendations  Home health OT;Supervision/Assistance - 24 hour    Equipment Recommendations  None recommended by OT    Recommendations for Other Services      Precautions / Restrictions Precautions Precautions: Fall       Mobility Bed Mobility                  Transfers                      Balance                                           ADL either performed or assessed with clinical judgement   ADL Overall ADL's : Needs assistance/impaired     Grooming: Oral care;Bed level                                 General ADL Comments: Patient with crital low H/H and complaining of fatigue and L arm pain.  Stayed bed level due to H/H.     Vision       Perception     Praxis      Cognition Arousal/Alertness: Awake/alert Behavior During Therapy: WFL for tasks assessed/performed Overall Cognitive Status: Impaired/Different from baseline Area of Impairment: Attention                   Current Attention Level: Selective            General Comments: Patient talking very quickly and is easily distracted.  Attention jumps from one thing to the next        Exercises     Shoulder Instructions       General Comments      Pertinent Vitals/ Pain       Pain Assessment: 0-10 Pain Score: 2  Pain Location: L arm, surgical site Pain Descriptors / Indicators: Nagging Pain Intervention(s): Monitored during session;Limited activity within patient's tolerance  Home Living                                          Prior Functioning/Environment              Frequency  Min 2X/week        Progress Toward Goals  OT Goals(current goals can now be found in the care plan section)  Progress towards OT goals: Progressing toward goals  Acute Rehab OT Goals Patient Stated Goal: get strong enough to go home OT Goal Formulation: With patient Time For Goal Achievement: 04/17/20 Potential to Achieve Goals: Good ADL Goals Additional ADL Goal #1: Patient will increase activity tolerance to 10 min standing ADLs.  Plan Discharge plan needs to be updated;Frequency remains appropriate    Co-evaluation                 AM-PAC OT "6 Clicks" Daily Activity     Outcome Measure   Help from another person eating meals?: None Help from another person taking care of personal grooming?: A Little Help from another person toileting, which includes using toliet, bedpan, or urinal?: A Little Help from another person bathing (including washing, rinsing, drying)?: A Little Help from another person to put on and taking off regular upper body clothing?: A Little Help from another person to put on and taking off regular lower body clothing?: A Little 6 Click Score: 19    End of Session    OT Visit Diagnosis: Unsteadiness on feet (R26.81);Muscle weakness (generalized) (M62.81);Other symptoms and signs involving cognitive function   Activity Tolerance Patient limited by fatigue;Treatment limited  secondary to medical complications (Comment) (6.6 H/H)   Patient Left in bed;with call bell/phone within reach   Nurse Communication Mobility status        Time: 1010-1021 OT Time Calculation (min): 11 min  Charges: OT General Charges $OT Visit: 1 Visit OT Treatments $Self Care/Home Management : 8-22 mins  August Luz, OTR/L    Phylliss Bob 04/03/2020, 3:24 PM

## 2020-04-04 ENCOUNTER — Encounter (HOSPITAL_COMMUNITY): Payer: Self-pay | Admitting: Internal Medicine

## 2020-04-04 LAB — THERAPEUTIC PLASMA EXCHANGE (BLOOD BANK)
Plasma volume needed: 2976
Unit division: 0
Unit division: 0
Unit division: 0
Unit division: 0
Unit division: 0
Unit division: 0

## 2020-04-04 LAB — TYPE AND SCREEN
ABO/RH(D): O POS
Antibody Screen: NEGATIVE
Unit division: 0
Unit division: 0

## 2020-04-04 LAB — RENAL FUNCTION PANEL
Albumin: 2.9 g/dL — ABNORMAL LOW (ref 3.5–5.0)
Anion gap: 13 (ref 5–15)
BUN: 41 mg/dL — ABNORMAL HIGH (ref 8–23)
CO2: 29 mmol/L (ref 22–32)
Calcium: 8 mg/dL — ABNORMAL LOW (ref 8.9–10.3)
Chloride: 97 mmol/L — ABNORMAL LOW (ref 98–111)
Creatinine, Ser: 5.1 mg/dL — ABNORMAL HIGH (ref 0.44–1.00)
GFR calc Af Amer: 9 mL/min — ABNORMAL LOW (ref >60)
GFR calc non Af Amer: 8 mL/min — ABNORMAL LOW (ref >60)
Glucose, Bld: 152 mg/dL — ABNORMAL HIGH (ref 70–99)
Phosphorus: 4.4 mg/dL (ref 2.5–4.6)
Potassium: 4 mmol/L (ref 3.5–5.1)
Sodium: 139 mmol/L (ref 135–145)

## 2020-04-04 LAB — BPAM RBC
Blood Product Expiration Date: 202108072359
Blood Product Expiration Date: 202108082359
ISSUE DATE / TIME: 202107100825
ISSUE DATE / TIME: 202107130649
Unit Type and Rh: 5100
Unit Type and Rh: 5100

## 2020-04-04 LAB — GLOMERULAR BASEMENT MEMBRANE ANTIBODIES: GBM Ab: 55 units — ABNORMAL HIGH (ref 0–20)

## 2020-04-04 MED ORDER — HEPARIN SODIUM (PORCINE) 1000 UNIT/ML IJ SOLN
INTRAMUSCULAR | Status: AC
  Filled 2020-04-04: qty 4

## 2020-04-04 MED ORDER — HYDRALAZINE HCL 20 MG/ML IJ SOLN: 10.0000 mg | Freq: Once | INTRAMUSCULAR | Status: AC

## 2020-04-04 MED ORDER — HYDRALAZINE HCL 20 MG/ML IJ SOLN
INTRAMUSCULAR | Status: AC
  Administered 2020-04-04: 10 mg via INTRAVENOUS
  Filled 2020-04-04: qty 1

## 2020-04-04 MED ORDER — LABETALOL HCL 5 MG/ML IV SOLN
10.0000 mg | INTRAVENOUS | Status: DC | PRN
  Administered 2020-04-04: 10 mg via INTRAVENOUS
  Filled 2020-04-04: qty 4

## 2020-04-04 NOTE — Progress Notes (Signed)
Patient going to dialysis. Alert and oriented x 4; no acute distress noted, no complaints at this time. VS stable

## 2020-04-04 NOTE — Progress Notes (Signed)
Patient in dialysis. MEWS yellow and red due to high BP and RR

## 2020-04-04 NOTE — Progress Notes (Addendum)
Patient back from dialysis. Alert and oriented x 4; no acute distress noted, no complaints. VS stable. Will continue to monitor.  

## 2020-04-04 NOTE — Progress Notes (Signed)
Patient ID: Jasmine White, female   DOB: 01/23/1946, 74 y.o.   MRN: [id number] S: feeling very stressed and emotional this AM dealing with some issues related to her mom's dementia and SNF placement; had nightmares last night too.  No new complaints.  O:BP (!) 186/72   Pulse 83   Temp 98.6 F (37 C) (Oral)   Resp 20   Ht 5' 5.98" (1.676 m)   Wt 71.7 kg   SpO2 94%   BMI 25.53 kg/m   Intake/Output Summary (Last 24 hours) at 04/04/2020 0934 Last data filed at 04/04/2020 0903 Gross per 24 hour  Intake 480 ml  Output --  Net 480 ml   Intake/Output: I/O last 3 completed shifts: In: 540 [P.O.:540] Out: -   Intake/Output this shift:  Total I/O In: 180 [P.O.:180] Out: -  Weight change:  Gen: NAD but tearful this AM CVS: no rub Resp: normal WOB on RA; clear ant Abd:+BS, soft, nt/nd Ext: no edema, LUE AVF incision c/d/i with faint bruit and thrill, 2+ radial pulse and hand warm  Recent Labs  Lab 03/29/20 1155 03/29/20 1155 03/30/20 0752 03/30/20 0752 03/31/20 0315 03/31/20 0315 03/31/20 1431 04/01/20 0715 04/02/20 0310 04/02/20 1543 04/03/20 0454 04/03/20 1520 04/04/20 0827  NA 135   < > 133*   < > 138   < > 137 138 139 140 137 136 139  K 3.5   < > 3.3*   < > 3.9   < > 3.9 3.7 3.7 4.3 4.6 4.7 4.0  CL 95*   < > 90*   < > 95*   < > 94* 93* 94* 95* 96* 93* 97*  CO2 28   < > 31  --  34*  --  30 33* 31  --  30  --  29  GLUCOSE 137*   < > 95   < > 93   < > 217* 96 91 98 87 207* 152*  BUN 27*   < > 38*   < > 18   < > 28* 38* 51* 15 22 30* 41*  CREATININE 3.61*   < > 4.50*   < > 2.68*   < > 3.77* 4.87* 6.26* 2.20* 3.52* 4.00* 5.10*  ALBUMIN 2.8*  --  2.9*  --  2.5*  --   --  2.8* 2.9*  --  2.8*  --  2.9*  CALCIUM 8.4*   < > 8.8*  --  7.9*  --  8.0* 8.5* 8.7*  --  8.0*  --  8.0*  PHOS 2.5  --  4.1  --  3.0  --   --  4.2 4.4  --  3.8  --  4.4   < > = values in this interval not displayed.   Liver Function Tests: Recent Labs  Lab 04/02/20 0310 04/03/20 0454 04/04/20 0827   ALBUMIN 2.9* 2.8* 2.9*   No results for input(s): LIPASE, AMYLASE in the last 168 hours. No results for input(s): AMMONIA in the last 168 hours. CBC: Recent Labs  Lab 03/30/20 0753 03/30/20 0753 03/31/20 0315 03/31/20 0315 04/01/20 0715 04/01/20 0715 04/02/20 0310 04/02/20 1543 04/03/20 0454 04/03/20 1202 04/03/20 1520  WBC 15.7*   < > 8.5   < > 9.1  --  11.1*  --  7.2  --   --   HGB 7.1*   < > 6.0*   < > 7.4*   < > 7.4*   < > 6.7* 8.1* 14.6  HCT 22.3*   < > 18.9*   < > 23.2*   < > 23.0*   < > 21.1* 25.8* 43.0  MCV 89.6  --  88.3  --  87.5  --  88.8  --  92.5  --   --   PLT 146*   < > 123*   < > 124*  --  133*  --  128*  --   --    < > = values in this interval not displayed.   Cardiac Enzymes: No results for input(s): CKTOTAL, CKMB, CKMBINDEX, TROPONINI in the last 168 hours. CBG: No results for input(s): GLUCAP in the last 168 hours.  Iron Studies: No results for input(s): IRON, TIBC, TRANSFERRIN, FERRITIN in the last 72 hours. Studies/Results: No results found. . sodium chloride   Intravenous Once  . sodium chloride   Intravenous Once  . amLODipine  10 mg Oral Daily  . Chlorhexidine Gluconate Cloth  6 each Topical Q0600  . cholecalciferol  1,000 Units Oral Daily  . cyclophosphamide  50 mg Oral q1800  . [START ON 04/06/2020] darbepoetin (ARANESP) injection - DIALYSIS  100 mcg Intravenous Q Fri-HD  . docusate sodium  200 mg Oral BID  . heparin injection (subcutaneous)  5,000 Units Subcutaneous Q8H  . hydrALAZINE  100 mg Oral TID  . levothyroxine  88 mcg Oral Q0600  . metoprolol tartrate  100 mg Oral BID  . pantoprazole  40 mg Oral Daily  . polyethylene glycol  17 g Oral BID  . predniSONE  40 mg Oral Q breakfast  . sulfamethoxazole-trimethoprim  1 tablet Oral Q M,W,F    BMET    Component Value Date/Time   NA 139 04/04/2020 0827   NA 128 (L) 03/01/2020 0927   K 4.0 04/04/2020 0827   CL 97 (L) 04/04/2020 0827   CO2 29 04/04/2020 0827   GLUCOSE 152 (H)  04/04/2020 0827   BUN 41 (H) 04/04/2020 0827   BUN 67 (H) 03/01/2020 0927   CREATININE 5.10 (H) 04/04/2020 0827   CREATININE 0.84 12/21/2012 1552   CALCIUM 8.0 (L) 04/04/2020 0827   GFRNONAA 8 (L) 04/04/2020 0827   GFRNONAA 72 12/21/2012 1552   GFRAA 9 (L) 04/04/2020 0827   GFRAA 83 12/21/2012 1552   CBC    Component Value Date/Time   WBC 7.2 04/03/2020 0454   RBC 2.28 (L) 04/03/2020 0454   HGB 14.6 04/03/2020 1520   HGB 9.7 (L) 03/01/2020 0927   HCT 43.0 04/03/2020 1520   HCT 28.8 (L) 03/01/2020 0927   PLT 128 (L) 04/03/2020 0454   PLT 397 03/01/2020 0927   MCV 92.5 04/03/2020 0454   MCV 79 03/01/2020 0927   MCH 29.4 04/03/2020 0454   MCHC 31.8 04/03/2020 0454   RDW 16.9 (H) 04/03/2020 0454   RDW 14.7 03/01/2020 0927   LYMPHSABS 0.3 (L) 03/22/2020 0103   LYMPHSABS 1.3 03/01/2020 0927   MONOABS 0.3 03/22/2020 0103   EOSABS 0.0 03/22/2020 0103   EOSABS 0.2 03/01/2020 0927   BASOSABS 0.0 03/22/2020 0103   BASOSABS 0.1 03/01/2020 0927    Assessment/Plan:  1. 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 Started on daily plasmapheresis and IV solumedrol/po cytoxan. Requiring HD.  1. Biopsy with 100% crescents and significant amount of ATN. 2. [place] continue with HD on MWF schedule for now. 3. For pheresis with FFP due to possible alveolar hemorrhagedaily.   4. Continue cytoxan 50 mg daily 5.  Developed agitation with high dose pred so it was decreased to 40mg  daily, cont for now.  6. She has completed2 weeks of PLEX treatments; resent GBM Ab 7/13, expect result soon.   7. Bactrim MWF for PCP prophylaxis 8. Continue to follow for renal recovery, however doubtful given extensive crescents on biopsy.  9. MWF HD arranged at [facility] 2. HTN- follow with HD and UF --> was HTN in past 12h but stressed and due for HD.  Hold on adding meds for now.  3. Vascular access- RIJ TDC placed 03/20/20 by IR.   VVS placed BC AVF  04/02/20. 4. Thrombocytopenia - noted modest thrombocytopenia in last few days - stable but noted; CTM.  Check tomorrow AM 5. Anemia - Hb 6.7 > 8.1 with transfusion 7/13.  No reported bleeding.  1u pRBC transfused, iron replete. ^ ESA for next dose Friday.  Check tomorrow AM. 6. Disposition- outpt HD at [facility] MWF arranged.  Discharge pending repeat antiGBM to determine further PLEX  Jannifer Hick MD [place] Pager [phone]

## 2020-04-04 NOTE — Progress Notes (Addendum)
PT Cancellation Note  Patient Details Name: Jasmine White MRN: [id number] DOB: [birth year]   Cancelled Treatment:    Reason Eval/Treat Not Completed: Patient at procedure or test/unavailable.  Pt at HD.  We are having a hard time catching her. Thanks,  Verdene Lennert, PT, DPT  Acute Rehabilitation [phone] pager #[phone] office       [name] 04/04/2020, 3:30 PM

## 2020-04-04 NOTE — Progress Notes (Signed)
Progress Note    Jasmine White  KYH:[id number] DOB: October 30, 1945  DOA: 03/17/2020 PCP: Chevis Pretty, FNP      Brief Narrative:    Medical records reviewed and are as summarized below:  Jasmine White is a 74 y.o. female  with history of HTN, hypothyroidism, HLD, recently diagnosed by PCP with renal dysfunction, history of COVID-19 infection in November 2020-since then been having persistent fatigue-who presented with persistent nausea/vomiting-further evaluation revealed severe hyponatremia with a sodium of 109, worsening renal function with a creatinine of 7.12- she was admitted-provided supportive care-subsequently nephrology was consulted-she underwent renal biopsy-which showed 100% crescents and significant amount of ATN.  She was subsequently started on plasmapheresis, Solu-Medrol/Cytoxan and also on hemodialysis.       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  Remains on steroids, Cytoxan and Bactrim MWF for PCP prophylaxis.  Due to neurological side effects/hyperactivity-steroids doses being  tapered-seems to be tolerating 40 mg of prednisone relatively well.  s/p left AV fistula creation on 7/12.   Alveolar hemorrhage: No hemoptysis-not hypoxic-see CT chest above-started on plasmapheresis daily plasmapheresis x2 weeks (on 12/14 days).    Elevated ANA/dsDNA levels: Not sure if it's related to ?lupus-in any event-already on appropriate treatment.  Stable for outpatient rheumatology follow-up.  Acute encephalopathy: Secondary to steroids-has improved-steroids being slowly tapered down-seems to be relatively stable on 40 mg of prednisone.  Continue to follow daily.  Hyponatremia: Secondary to hypovolemia/HCTZ use-and  AKI-resolved with supportive care.  Normocytic anemia: Probably related to CKD/immunosuppressive agents-s/p total of 3 units of PRBCs thus far.  Last transfusion was on 04/03/2020. SPEP-without M spike.    Constipation: Resolved.  Hypothyroidism: Continue Synthroid-TSH stable.  Deconditioning: Continue PT and OT    Body mass index is 24.24 kg/m.  Diet Order            Diet renal with fluid restriction Fluid restriction: 1200 mL Fluid; Room service appropriate? Yes; Fluid consistency: Thin  Diet effective now                       Medications:   . sodium chloride   Intravenous Once  . sodium chloride   Intravenous Once  . amLODipine  10 mg Oral Daily  . Chlorhexidine Gluconate Cloth  6 each Topical Q0600  . cholecalciferol  1,000 Units Oral Daily  . cyclophosphamide  50 mg Oral q1800  . [START ON 04/06/2020] darbepoetin (ARANESP) injection - DIALYSIS  100 mcg Intravenous Q Fri-HD  . docusate sodium  200 mg Oral BID  . heparin injection (subcutaneous)  5,000 Units Subcutaneous Q8H  . hydrALAZINE  100 mg Oral TID  . levothyroxine  88 mcg Oral Q0600  . metoprolol tartrate  100 mg Oral BID  . pantoprazole  40 mg Oral Daily  . polyethylene glycol  17 g Oral BID  . predniSONE  40 mg Oral Q breakfast  . sulfamethoxazole-trimethoprim  1 tablet Oral Q M,W,F   Continuous Infusions:   Anti-infectives (From admission, onward)   Start     Dose/Rate Route Frequency Ordered Stop   04/02/20 0700  ceFAZolin (ANCEF) IVPB 1 g/50 mL premix       Note to Pharmacy: Send with pt to OR   1 g  100 mL/hr over 30 Minutes Intravenous To [facility] 04/01/20 2111 04/02/20 1607   03/26/20 1000  sulfamethoxazole-trimethoprim (BACTRIM) 400-80 MG per tablet 1 tablet     Discontinue     1 tablet Oral Every M-W-F 03/25/20 0954     03/25/20 2200  sulfamethoxazole-trimethoprim (BACTRIM) 400-80 MG per tablet 1 tablet  Status:  Discontinued        1 tablet Oral Daily at bedtime 03/25/20  0854 03/25/20 0921   03/20/20 1319  ceFAZolin (ANCEF) 2-4 GM/100ML-% IVPB       Note to Pharmacy: Arlean Hopping   : cabinet override      03/20/20 1319 03/21/20 0129   03/20/20 1100  ceFAZolin (ANCEF) IVPB 2g/100 mL premix        2 g 200 mL/hr over 30 Minutes Intravenous On call 03/20/20 1040 03/20/20 1506   03/19/20 0000  cefTRIAXone (ROCEPHIN) 1 g in sodium chloride 0.9 % 100 mL IVPB       Note to Pharmacy: 1st dose now   1 g 200 mL/hr over 30 Minutes Intravenous Every 24 hours 03/18/20 0127 03/21/20 0008   03/18/20 0145  cefTRIAXone (ROCEPHIN) 1 g in sodium chloride 0.9 % 100 mL IVPB       Note to Pharmacy: 1st dose now   1 g 200 mL/hr over 30 Minutes Intravenous  Once 03/18/20 0135 03/18/20 0252             Family Communication/Anticipated D/C date and plan/Code Status   DVT prophylaxis: heparin injection 5,000 Units Start: 03/23/20 1800 Place and maintain sequential compression device Start: 03/20/20 0746     Code Status: Full Code  Family Communication: Plan discussed with patient Disposition Plan:    Status is: Inpatient  Remains inpatient appropriate because:Inpatient level of care appropriate due to severity of illness   Dispo: The patient is from: Home              Anticipated d/c is to: Home              Anticipated d/c date is: 3 days              Patient currently is not medically stable to d/c.           Subjective:   No complaints.  She feels better than she did yesterday.  No shortness of breath or chest pain.  Objective:    Vitals:   04/04/20 1430 04/04/20 1500 04/04/20 1530 04/04/20 1600  BP: (!) 209/91 (!) 207/92 (!) 213/106 (!) 206/104  Pulse: 84 90 88 86  Resp: (!) 30 (!) 28 (!) 25 (!) 29  Temp:      TempSrc:      SpO2:      Weight:      Height:       No data found.   Intake/Output Summary (Last 24 hours) at 04/04/2020 1618 Last data filed at 04/04/2020 1332 Gross per 24 hour  Intake 300 ml  Output --  Net 300 ml    Filed Weights   04/02/20 0709 04/03/20 0323 04/04/20 1350  Weight: 67.6 kg 71.7 kg 68.1 kg    Exam:  GEN: NAD SKIN: No rash EYES: EOMI ENT: MMM CV: RRR PULM: CTA B ABD: soft, ND, NT, +BS CNS: AAO x 3, non focal EXT: No edema or tenderness       Data Reviewed:   I have personally reviewed following labs and imaging studies:  Labs: Labs show the following:  Basic Metabolic Panel: Recent Labs  Lab 03/31/20 0315 03/31/20 0315 03/31/20 1431 03/31/20 1431 04/01/20 0715 04/01/20 0715 04/02/20 0310 04/02/20 0310 04/02/20 1543 04/02/20 1543 04/03/20 0454 04/03/20 0454 04/03/20 1520 04/04/20 0827  NA 138   < > 137   < > 138   < > 139  --  140  --  137  --  136 139  K 3.9   < > 3.9   < > 3.7   < > 3.7   < > 4.3   < > 4.6   < > 4.7 4.0  CL 95*   < > 94*   < > 93*   < > 94*  --  95*  --  96*  --  93* 97*  CO2 34*   < > 30  --  33*  --  31  --   --   --  30  --   --  29  GLUCOSE 93   < > 217*   < > 96   < > 91  --  98  --  87  --  207* 152*  BUN 18   < > 28*   < > 38*   < > 51*  --  15  --  22  --  30* 41*  CREATININE 2.68*   < > 3.77*   < > 4.87*   < > 6.26*  --  2.20*  --  3.52*  --  4.00* 5.10*  CALCIUM 7.9*   < > 8.0*  --  8.5*  --  8.7*  --   --   --  8.0*  --   --  8.0*  PHOS 3.0  --   --   --  4.2  --  4.4  --   --   --  3.8  --   --  4.4   < > = values in this interval not displayed.   GFR Estimated Creatinine Clearance: 9.1 mL/min (A) (by C-G formula based on SCr of 5.1 mg/dL (H)). Liver Function Tests: Recent Labs  Lab 03/31/20 0315 04/01/20 0715 04/02/20 0310 04/03/20 0454 04/04/20 0827  ALBUMIN 2.5* 2.8* 2.9* 2.8* 2.9*   No results for input(s): LIPASE, AMYLASE in the last 168 hours. No results for input(s): AMMONIA in the last 168 hours. Coagulation profile Recent Labs  Lab 04/02/20 0310  INR 1.0    CBC: Recent Labs  Lab 03/30/20 0753 03/30/20 0753 03/31/20 0315 03/31/20 0315 04/01/20 0715 04/01/20 0715 04/02/20 0310  04/02/20 1543 04/03/20 0454 04/03/20 1202 04/03/20 1520  WBC 15.7*  --  8.5  --  9.1  --  11.1*  --  7.2  --   --   HGB 7.1*   < > 6.0*   < > 7.4*   < > 7.4* 7.5* 6.7* 8.1* 14.6  HCT 22.3*   < > 18.9*   < > 23.2*   < > 23.0* 22.0* 21.1* 25.8* 43.0  MCV 89.6  --  88.3  --  87.5  --  88.8  --  92.5  --   --   PLT 146*  --  123*  --  124*  --  133*  --  128*  --   --    < > = values in this interval not displayed.   Cardiac Enzymes: No results for input(s): CKTOTAL, CKMB, CKMBINDEX, TROPONINI in the last 168 hours. BNP (last 3 results) No results for input(s): PROBNP in  the last 8760 hours. CBG: No results for input(s): GLUCAP in the last 168 hours. D-Dimer: No results for input(s): DDIMER in the last 72 hours. Hgb A1c: No results for input(s): HGBA1C in the last 72 hours. Lipid Profile: No results for input(s): CHOL, HDL, LDLCALC, TRIG, CHOLHDL, LDLDIRECT in the last 72 hours. Thyroid function studies: No results for input(s): TSH, T4TOTAL, T3FREE, THYROIDAB in the last 72 hours.  Invalid input(s): FREET3 Anemia work up: No results for input(s): VITAMINB12, FOLATE, FERRITIN, TIBC, IRON, RETICCTPCT in the last 72 hours. Sepsis Labs: Recent Labs  Lab 03/31/20 0315 04/01/20 0715 04/02/20 0310 04/03/20 0454  WBC 8.5 9.1 11.1* 7.2    Microbiology No results found for this or any previous visit (from the past 240 hour(s)).  Procedures and diagnostic studies:  No results found.             LOS: 18 days   Adrick Kestler  [facility]     04/04/2020, 4:18 PM

## 2020-04-05 ENCOUNTER — Inpatient Hospital Stay (HOSPITAL_COMMUNITY): Payer: Medicare PPO

## 2020-04-05 ENCOUNTER — Encounter (HOSPITAL_COMMUNITY)
Admission: EM | Disposition: A | Discharge status: Home Health | Payer: Self-pay | Source: Home / Self Care | Attending: Internal Medicine

## 2020-04-05 ENCOUNTER — Other Ambulatory Visit (HOSPITAL_COMMUNITY): Payer: Medicare PPO

## 2020-04-05 ENCOUNTER — Encounter (HOSPITAL_COMMUNITY): Payer: Medicare PPO

## 2020-04-05 DIAGNOSIS — J9601 Acute respiratory failure with hypoxia: Secondary | ICD-10-CM

## 2020-04-05 DIAGNOSIS — N171 Acute kidney failure with acute cortical necrosis: Secondary | ICD-10-CM

## 2020-04-05 LAB — CBC WITH DIFFERENTIAL/PLATELET
Abs Immature Granulocytes: 0.08 10*3/uL — ABNORMAL HIGH (ref 0.00–0.07)
Basophils Absolute: 0 10*3/uL (ref 0.0–0.1)
Basophils Relative: 0 %
Eosinophils Absolute: 0 10*3/uL (ref 0.0–0.5)
Eosinophils Relative: 0 %
HCT: 27.5 % — ABNORMAL LOW (ref 36.0–46.0)
Hemoglobin: 8.6 g/dL — ABNORMAL LOW (ref 12.0–15.0)
Immature Granulocytes: 1 %
Lymphocytes Relative: 4 %
Lymphs Abs: 0.3 10*3/uL — ABNORMAL LOW (ref 0.7–4.0)
MCH: 28.9 pg (ref 26.0–34.0)
MCHC: 31.3 g/dL (ref 30.0–36.0)
MCV: 92.3 fL (ref 80.0–100.0)
Monocytes Absolute: 0.3 10*3/uL (ref 0.1–1.0)
Monocytes Relative: 3 %
Neutro Abs: 8.6 10*3/uL — ABNORMAL HIGH (ref 1.7–7.7)
Neutrophils Relative %: 92 %
Platelets: 154 10*3/uL (ref 150–400)
RBC: 2.98 MIL/uL — ABNORMAL LOW (ref 3.87–5.11)
RDW: 17.6 % — ABNORMAL HIGH (ref 11.5–15.5)
WBC: 9.3 10*3/uL (ref 4.0–10.5)
nRBC: 0 % (ref 0.0–0.2)

## 2020-04-05 LAB — MRSA PCR SCREENING: MRSA by PCR: NEGATIVE

## 2020-04-05 LAB — POCT I-STAT 7, (LYTES, BLD GAS, ICA,H+H)
Acid-Base Excess: 12 mmol/L — ABNORMAL HIGH (ref 0.0–2.0)
Bicarbonate: 35.8 mmol/L — ABNORMAL HIGH (ref 20.0–28.0)
Calcium, Ion: 1.1 mmol/L — ABNORMAL LOW (ref 1.15–1.40)
HCT: 22 % — ABNORMAL LOW (ref 36.0–46.0)
Hemoglobin: 7.5 g/dL — ABNORMAL LOW (ref 12.0–15.0)
O2 Saturation: 100 %
Patient temperature: 98.1
Potassium: 5 mmol/L (ref 3.5–5.1)
Sodium: 137 mmol/L (ref 135–145)
TCO2: 37 mmol/L — ABNORMAL HIGH (ref 22–32)
pCO2 arterial: 40.7 mmHg (ref 32.0–48.0)
pH, Arterial: 7.551 — ABNORMAL HIGH (ref 7.350–7.450)
pO2, Arterial: 167 mmHg — ABNORMAL HIGH (ref 83.0–108.0)

## 2020-04-05 LAB — LACTIC ACID, PLASMA
Lactic Acid, Venous: 1.1 mmol/L (ref 0.5–1.9)
Lactic Acid, Venous: 2.4 mmol/L (ref 0.5–1.9)

## 2020-04-05 LAB — CBC
HCT: 27.4 % — ABNORMAL LOW (ref 36.0–46.0)
Hemoglobin: 8.4 g/dL — ABNORMAL LOW (ref 12.0–15.0)
MCH: 28.1 pg (ref 26.0–34.0)
MCHC: 30.7 g/dL (ref 30.0–36.0)
MCV: 91.6 fL (ref 80.0–100.0)
Platelets: 161 10*3/uL (ref 150–400)
RBC: 2.99 MIL/uL — ABNORMAL LOW (ref 3.87–5.11)
RDW: 17.2 % — ABNORMAL HIGH (ref 11.5–15.5)
WBC: 9.4 10*3/uL (ref 4.0–10.5)
nRBC: 0 % (ref 0.0–0.2)

## 2020-04-05 LAB — BRAIN NATRIURETIC PEPTIDE: B Natriuretic Peptide: 2852.3 pg/mL — ABNORMAL HIGH (ref 0.0–100.0)

## 2020-04-05 LAB — BLOOD GAS, ARTERIAL
Acid-Base Excess: 9.5 mmol/L — ABNORMAL HIGH (ref 0.0–2.0)
Bicarbonate: 32.3 mmol/L — ABNORMAL HIGH (ref 20.0–28.0)
Drawn by: 53309
FIO2: 60
O2 Saturation: 99.9 %
Patient temperature: 36
pCO2 arterial: 32.1 mmHg (ref 32.0–48.0)
pH, Arterial: 7.603 (ref 7.350–7.450)
pO2, Arterial: 201 mmHg — ABNORMAL HIGH (ref 83.0–108.0)

## 2020-04-05 LAB — BODY FLUID CELL COUNT WITH DIFFERENTIAL
Eos, Fluid: 1 %
Eos, Fluid: 0 %
Lymphs, Fluid: 9 %
Lymphs, Fluid: 20 %
Monocyte-Macrophage-Serous Fluid: 22 % — ABNORMAL LOW (ref 50–90)
Monocyte-Macrophage-Serous Fluid: 72 % (ref 50–90)
Neutrophil Count, Fluid: 68 % — ABNORMAL HIGH (ref 0–25)
Neutrophil Count, Fluid: 8 % (ref 0–25)
Total Nucleated Cell Count, Fluid: 125 cu mm (ref 0–1000)
Total Nucleated Cell Count, Fluid: 63 cu mm (ref 0–1000)

## 2020-04-05 LAB — PROCALCITONIN
Procalcitonin: 0.38 ng/mL
Procalcitonin: 0.37 ng/mL

## 2020-04-05 LAB — RENAL FUNCTION PANEL
Albumin: 3 g/dL — ABNORMAL LOW (ref 3.5–5.0)
Anion gap: 12 (ref 5–15)
BUN: 19 mg/dL (ref 8–23)
CO2: 26 mmol/L (ref 22–32)
Calcium: 8 mg/dL — ABNORMAL LOW (ref 8.9–10.3)
Chloride: 98 mmol/L (ref 98–111)
Creatinine, Ser: 2.99 mg/dL — ABNORMAL HIGH (ref 0.44–1.00)
GFR calc Af Amer: 17 mL/min — ABNORMAL LOW (ref >60)
GFR calc non Af Amer: 15 mL/min — ABNORMAL LOW (ref >60)
Glucose, Bld: 109 mg/dL — ABNORMAL HIGH (ref 70–99)
Phosphorus: 3.2 mg/dL (ref 2.5–4.6)
Potassium: 3.9 mmol/L (ref 3.5–5.1)
Sodium: 136 mmol/L (ref 135–145)

## 2020-04-05 LAB — PROTEIN, PLEURAL OR PERITONEAL FLUID: Total protein, fluid: <3 g/dL

## 2020-04-05 LAB — LACTATE DEHYDROGENASE: LDH: 196 U/L — ABNORMAL HIGH (ref 98–192)

## 2020-04-05 LAB — LACTATE DEHYDROGENASE, PLEURAL OR PERITONEAL FLUID: LD, Fluid: 50 U/L — ABNORMAL HIGH (ref 3–23)

## 2020-04-05 LAB — GLUCOSE, PLEURAL OR PERITONEAL FLUID: Glucose, Fluid: 133 mg/dL

## 2020-04-05 LAB — GLUCOSE, CAPILLARY
Glucose-Capillary: 148 mg/dL — ABNORMAL HIGH (ref 70–99)
Glucose-Capillary: 95 mg/dL (ref 70–99)
Glucose-Capillary: 109 mg/dL — ABNORMAL HIGH (ref 70–99)

## 2020-04-05 LAB — TRIGLYCERIDES: Triglycerides: 90 mg/dL (ref <150)

## 2020-04-05 LAB — PROTEIN, TOTAL: Total Protein: 5.6 g/dL — ABNORMAL LOW (ref 6.5–8.1)

## 2020-04-05 SURGERY — THORACENTESIS

## 2020-04-05 MED ORDER — FENTANYL CITRATE (PF) 100 MCG/2ML IJ SOLN: 25.0000 ug | Freq: Once | INTRAMUSCULAR | Status: DC

## 2020-04-05 MED ORDER — FUROSEMIDE 10 MG/ML IJ SOLN
40.0000 mg | Freq: Once | INTRAMUSCULAR | Status: AC
  Administered 2020-04-05: 40 mg via INTRAVENOUS
  Filled 2020-04-05: qty 4

## 2020-04-05 MED ORDER — ROCURONIUM BROMIDE 50 MG/5ML IV SOLN
60.0000 mg | Freq: Once | INTRAVENOUS | Status: AC
  Administered 2020-04-05: 60 mg via INTRAVENOUS

## 2020-04-05 MED ORDER — FENTANYL CITRATE (PF) 100 MCG/2ML IJ SOLN
100.0000 ug | Freq: Once | INTRAMUSCULAR | Status: AC
  Administered 2020-04-05: 100 ug via INTRAVENOUS

## 2020-04-05 MED ORDER — MIDAZOLAM HCL 2 MG/2ML IJ SOLN
2.0000 mg | Freq: Once | INTRAMUSCULAR | Status: AC
  Administered 2020-04-05: 2 mg via INTRAVENOUS

## 2020-04-05 MED ORDER — FENTANYL 2500MCG IN NS 250ML (10MCG/ML) PREMIX INFUSION
25.0000 ug/h | INTRAVENOUS | Status: DC
  Administered 2020-04-05: 50 ug/h via INTRAVENOUS
  Filled 2020-04-05 (×2): qty 250

## 2020-04-05 MED ORDER — ORAL CARE MOUTH RINSE
15.0000 mL | OROMUCOSAL | Status: DC
  Administered 2020-04-05 – 2020-04-08 (×27): 15 mL via OROMUCOSAL

## 2020-04-05 MED ORDER — ETOMIDATE 2 MG/ML IV SOLN
20.0000 mg | Freq: Once | INTRAVENOUS | Status: AC
  Administered 2020-04-05: 20 mg via INTRAVENOUS

## 2020-04-05 MED ORDER — SODIUM CHLORIDE 0.9 % IV SOLN: 2.0000 g | Freq: Once | INTRAVENOUS | Status: DC

## 2020-04-05 MED ORDER — VANCOMYCIN HCL 1250 MG/250ML IV SOLN
1250.0000 mg | Freq: Once | INTRAVENOUS | Status: DC
  Filled 2020-04-05: qty 250

## 2020-04-05 MED ORDER — ACETAMINOPHEN 325 MG PO TABS
ORAL_TABLET | ORAL | Status: AC
  Filled 2020-04-05: qty 2

## 2020-04-05 MED ORDER — DIPHENHYDRAMINE HCL 25 MG PO CAPS
ORAL_CAPSULE | ORAL | Status: AC
  Filled 2020-04-05: qty 1

## 2020-04-05 MED ORDER — DIPHENHYDRAMINE HCL 25 MG PO CAPS
25.0000 mg | ORAL_CAPSULE | Freq: Four times a day (QID) | ORAL | Status: DC | PRN
  Administered 2020-04-05: 25 mg via ORAL

## 2020-04-05 MED ORDER — CLONIDINE HCL 0.2 MG PO TABS
0.1000 mg | ORAL_TABLET | Freq: Three times a day (TID) | ORAL | Status: DC
  Administered 2020-04-05: 0.1 mg via ORAL
  Filled 2020-04-05 (×2): qty 1

## 2020-04-05 MED ORDER — FENTANYL BOLUS VIA INFUSION
25.0000 ug | INTRAVENOUS | Status: DC | PRN
  Filled 2020-04-05: qty 25

## 2020-04-05 MED ORDER — ANTICOAGULANT SODIUM CITRATE 4% (200MG/5ML) IV SOLN
5.0000 mL | Freq: Once | Status: AC
  Administered 2020-04-05: 5 mL
  Filled 2020-04-05: qty 5

## 2020-04-05 MED ORDER — FENTANYL CITRATE (PF) 100 MCG/2ML IJ SOLN: 50.0000 ug | Freq: Once | INTRAMUSCULAR | Status: DC

## 2020-04-05 MED ORDER — CALCIUM CARBONATE ANTACID 500 MG PO CHEW
2.0000 | CHEWABLE_TABLET | ORAL | Status: AC
  Administered 2020-04-05: 400 mg via ORAL
  Filled 2020-04-05: qty 2

## 2020-04-05 MED ORDER — ACD FORMULA A 0.73-2.45-2.2 GM/100ML VI SOLN
Status: AC
  Filled 2020-04-05: qty 500

## 2020-04-05 MED ORDER — ACETAMINOPHEN 325 MG PO TABS
650.0000 mg | ORAL_TABLET | ORAL | Status: DC | PRN
  Administered 2020-04-05: 650 mg via ORAL

## 2020-04-05 MED ORDER — INSULIN ASPART 100 UNIT/ML ~~LOC~~ SOLN
0.0000 [IU] | SUBCUTANEOUS | Status: DC
  Administered 2020-04-05 – 2020-04-07 (×4): 2 [IU] via SUBCUTANEOUS
  Administered 2020-04-07: 4 [IU] via SUBCUTANEOUS
  Administered 2020-04-07: 8 [IU] via SUBCUTANEOUS
  Administered 2020-04-08: 4 [IU] via SUBCUTANEOUS
  Administered 2020-04-08 – 2020-04-09 (×3): 2 [IU] via SUBCUTANEOUS

## 2020-04-05 MED ORDER — AMITRIPTYLINE HCL 25 MG PO TABS
50.0000 mg | ORAL_TABLET | Freq: Every evening | ORAL | Status: AC | PRN
  Administered 2020-04-05: 50 mg via ORAL
  Filled 2020-04-05: qty 2

## 2020-04-05 MED ORDER — VANCOMYCIN HCL IN DEXTROSE 750-5 MG/150ML-% IV SOLN: 750.0000 mg | INTRAVENOUS | Status: DC

## 2020-04-05 MED ORDER — ACD FORMULA A 0.73-2.45-2.2 GM/100ML VI SOLN
1000.0000 mL | Status: DC
  Administered 2020-04-05: 1000 mL
  Filled 2020-04-05: qty 1000

## 2020-04-05 MED ORDER — METHYLPREDNISOLONE SODIUM SUCC 125 MG IJ SOLR
40.0000 mg | Freq: Three times a day (TID) | INTRAMUSCULAR | Status: DC
  Administered 2020-04-05 – 2020-04-09 (×12): 40 mg via INTRAVENOUS
  Filled 2020-04-05 (×12): qty 2

## 2020-04-05 MED ORDER — CALCIUM GLUCONATE-NACL 2-0.675 GM/100ML-% IV SOLN
2.0000 g | Freq: Once | INTRAVENOUS | Status: AC
  Administered 2020-04-05: 2000 mg via INTRAVENOUS
  Filled 2020-04-05: qty 100

## 2020-04-05 MED ORDER — CALCIUM CARBONATE ANTACID 500 MG PO CHEW
CHEWABLE_TABLET | ORAL | Status: AC
  Administered 2020-04-05: 400 mg via ORAL
  Filled 2020-04-05: qty 2

## 2020-04-05 MED ORDER — PROPOFOL 1000 MG/100ML IV EMUL
5.0000 ug/kg/min | INTRAVENOUS | Status: DC
  Administered 2020-04-05: 15 ug/kg/min via INTRAVENOUS
  Filled 2020-04-05: qty 200

## 2020-04-05 MED ORDER — CHLORHEXIDINE GLUCONATE 0.12% ORAL RINSE (MEDLINE KIT)
15.0000 mL | Freq: Two times a day (BID) | OROMUCOSAL | Status: DC
  Administered 2020-04-05 – 2020-04-09 (×8): 15 mL via OROMUCOSAL

## 2020-04-05 MED ORDER — SODIUM CHLORIDE 0.9 % IV SOLN: 2.0000 g | INTRAVENOUS | Status: DC

## 2020-04-05 NOTE — Progress Notes (Signed)
PT Cancellation Note  Patient Details Name: NATE PERRI MRN: [id number] DOB: June 13, 1946   Cancelled Treatment:    Reason Eval/Treat Not Completed: Medical issues which prohibited therapy;Patient at procedure or test/unavailable (rapid called this morning and going down for CT). RN asked PT to hold therapy today. Will check on pt tomorrow.   Leighton Roach, PT  Acute Rehab Services  Pager [phone] Office [place] 04/05/2020, 12:55 PM

## 2020-04-05 NOTE — Procedures (Signed)
Intubation Procedure Note  Jasmine White  [id number]  08-25-1946  Date:04/05/20  Time:3:58 PM   Provider Performing:Anniyah Mood C Reiner Loewen directing Dr. Darrick Meigs   Procedure: Intubation (31500)  Indication(s) Respiratory Failure  Consent Risks of the procedure as well as the alternatives and risks of each were explained to the patient and/or caregiver.  Consent for the procedure was obtained and is signed in the bedside chart   Anesthesia Etomidate, Versed, Fentanyl, Rocuronium and Propofol   Time Out Verified patient identification, verified procedure, site/side was marked, verified correct patient position, special equipment/implants available, medications/allergies/relevant history reviewed, required imaging and test results available.   Sterile Technique Usual hand hygeine, masks, and gloves were used   Procedure Description Patient positioned in bed supine.  Sedation given as noted above.  Patient was intubated with endotracheal tube using Glidescope.  View was Grade 1 full glottis .  Number of attempts was 1.  Colorimetric CO2 detector was consistent with tracheal placement.   Complications/Tolerance None; patient tolerated the procedure well. Chest X-ray is ordered to verify placement.   EBL Minimal   Specimen(s) None

## 2020-04-05 NOTE — Progress Notes (Signed)
Patient ID: Jasmine White, female   DOB: 03-07-1946, 74 y.o.   MRN: [id number] S: Tolerated HD yesterday but BP up all day and into 200s during HD.  Responded to IV hdyralazine.  Didn't improve with UF.  Overnight ^ WOB and required 2L then 4L [place].  This AM feeling dyspnea.  Coughing up sputum but no blood.  Afebrile but feels sweaty.    O:BP (!) 172/76   Pulse 95   Temp 98.6 F (37 C)   Resp (!) 25   Ht 5' 5.98" (1.676 m)   Wt 66.6 kg   SpO2 97%   BMI 23.71 kg/m   Intake/Output Summary (Last 24 hours) at 04/05/2020 0949 Last data filed at 04/04/2020 1743 Gross per 24 hour  Intake 120 ml  Output 1500 ml  Net -1380 ml   Intake/Output: I/O last 3 completed shifts: In: 300 [P.O.:300] Out: 1500 [Other:1500]  Intake/Output this shift:  No intake/output data recorded. Weight change:  Gen: uncomfortable with ^ WOB CVS: no rub Resp: ^ WOB, coarse ant, dec BS bases with a few rales. Abd:+BS, soft, nt/nd Ext: no edema, LUE AVF incision c/d/i with faint bruit and thrill, 2+ radial pulse and hand warm  Recent Labs  Lab 03/30/20 0752 03/30/20 0752 03/31/20 0315 03/31/20 0315 03/31/20 1431 03/31/20 1431 04/01/20 0715 04/02/20 0310 04/02/20 1543 04/03/20 0454 04/03/20 1520 04/04/20 0827 04/05/20 0456  NA 133*   < > 138   < > 137   < > 138 139 140 137 136 139 136  K 3.3*   < > 3.9   < > 3.9   < > 3.7 3.7 4.3 4.6 4.7 4.0 3.9  CL 90*   < > 95*   < > 94*   < > 93* 94* 95* 96* 93* 97* 98  CO2 31   < > 34*  --  30  --  33* 31  --  30  --  29 26  GLUCOSE 95   < > 93   < > 217*   < > 96 91 98 87 207* 152* 109*  BUN 38*   < > 18   < > 28*   < > 38* 51* 15 22 30* 41* 19  CREATININE 4.50*   < > 2.68*   < > 3.77*   < > 4.87* 6.26* 2.20* 3.52* 4.00* 5.10* 2.99*  ALBUMIN 2.9*  --  2.5*  --   --   --  2.8* 2.9*  --  2.8*  --  2.9* 3.0*  CALCIUM 8.8*   < > 7.9*  --  8.0*  --  8.5* 8.7*  --  8.0*  --  8.0* 8.0*  PHOS 4.1  --  3.0  --   --   --  4.2 4.4  --  3.8  --  4.4 3.2   < > = values in  this interval not displayed.   Liver Function Tests: Recent Labs  Lab 04/03/20 0454 04/04/20 0827 04/05/20 0456  ALBUMIN 2.8* 2.9* 3.0*   No results for input(s): LIPASE, AMYLASE in the last 168 hours. No results for input(s): AMMONIA in the last 168 hours. CBC: Recent Labs  Lab 03/31/20 0315 03/31/20 0315 04/01/20 0715 04/01/20 0715 04/02/20 0310 04/02/20 1543 04/03/20 0454 04/03/20 0454 04/03/20 1202 04/03/20 1520 04/05/20 0456  WBC 8.5   < > 9.1   < > 11.1*  --  7.2  --   --   --  9.4  HGB 6.0*   < > 7.4*   < > 7.4*   < > 6.7*   < > 8.1* 14.6 8.4*  HCT 18.9*   < > 23.2*   < > 23.0*   < > 21.1*   < > 25.8* 43.0 27.4*  MCV 88.3  --  87.5  --  88.8  --  92.5  --   --   --  91.6  PLT 123*   < > 124*   < > 133*  --  128*  --   --   --  161   < > = values in this interval not displayed.   Cardiac Enzymes: No results for input(s): CKTOTAL, CKMB, CKMBINDEX, TROPONINI in the last 168 hours. CBG: No results for input(s): GLUCAP in the last 168 hours.  Iron Studies: No results for input(s): IRON, TIBC, TRANSFERRIN, FERRITIN in the last 72 hours. Studies/Results: DG CHEST PORT 1 VIEW  Result Date: 04/05/2020 CLINICAL DATA:  Shortness of breath. EXAM: PORTABLE CHEST 1 VIEW COMPARISON:  03/22/2020 FINDINGS: The right IJ dialysis catheter is in good position, unchanged. The cardiac silhouette, mediastinal and hilar contours are within normal limits. Patchy asymmetric airspace process in the lungs along with bilateral pleural effusions. No pneumothorax. IMPRESSION: Patchy asymmetric airspace process and bilateral pleural effusions. Electronically Signed   By: Marijo Sanes M.D.   On: 04/05/2020 05:54   . sodium chloride   Intravenous Once  . sodium chloride   Intravenous Once  . acetaminophen      . amLODipine  10 mg Oral Daily  . calcium carbonate      . calcium carbonate  2 tablet Oral Q3H  . Chlorhexidine Gluconate Cloth  6 each Topical Q0600  . cholecalciferol  1,000 Units  Oral Daily  . cloNIDine  0.1 mg Oral TID  . cyclophosphamide  50 mg Oral q1800  . [START ON 04/06/2020] darbepoetin (ARANESP) injection - DIALYSIS  100 mcg Intravenous Q Fri-HD  . diphenhydrAMINE      . docusate sodium  200 mg Oral BID  . heparin injection (subcutaneous)  5,000 Units Subcutaneous Q8H  . hydrALAZINE  100 mg Oral TID  . levothyroxine  88 mcg Oral Q0600  . metoprolol tartrate  100 mg Oral BID  . pantoprazole  40 mg Oral Daily  . polyethylene glycol  17 g Oral BID  . predniSONE  40 mg Oral Q breakfast  . sulfamethoxazole-trimethoprim  1 tablet Oral Q M,W,F    BMET    Component Value Date/Time   NA 136 04/05/2020 0456   NA 128 (L) 03/01/2020 0927   K 3.9 04/05/2020 0456   CL 98 04/05/2020 0456   CO2 26 04/05/2020 0456   GLUCOSE 109 (H) 04/05/2020 0456   BUN 19 04/05/2020 0456   BUN 67 (H) 03/01/2020 0927   CREATININE 2.99 (H) 04/05/2020 0456   CREATININE 0.84 12/21/2012 1552   CALCIUM 8.0 (L) 04/05/2020 0456   GFRNONAA 15 (L) 04/05/2020 0456   GFRNONAA 72 12/21/2012 1552   GFRAA 17 (L) 04/05/2020 0456   GFRAA 83 12/21/2012 1552   CBC    Component Value Date/Time   WBC 9.4 04/05/2020 0456   RBC 2.99 (L) 04/05/2020 0456   HGB 8.4 (L) 04/05/2020 0456   HGB 9.7 (L) 03/01/2020 0927   HCT 27.4 (L) 04/05/2020 0456   HCT 28.8 (L) 03/01/2020 0927   PLT 161 04/05/2020 0456   PLT 397 03/01/2020 0927   MCV 91.6 04/05/2020 0456  MCV 79 03/01/2020 0927   MCH 28.1 04/05/2020 0456   MCHC 30.7 04/05/2020 0456   RDW 17.2 (H) 04/05/2020 0456   RDW 14.7 03/01/2020 0927   LYMPHSABS 0.3 (L) 03/22/2020 0103   LYMPHSABS 1.3 03/01/2020 0927   MONOABS 0.3 03/22/2020 0103   EOSABS 0.0 03/22/2020 0103   EOSABS 0.2 03/01/2020 0927   BASOSABS 0.0 03/22/2020 0103   BASOSABS 0.1 03/01/2020 0927    Assessment/Plan:  1. Hypoxia:  New issue overnight.  CXR with patchy opacities and small effusions - doesn't look much different to me c/w 7/1.  She looks uncomfortable.  D/w  hospitalist bedside.  She's afebrile with no leukocytosis but I'm still concerned for infection vs DAH, PE also concern though she has been on chemoprophylaxis. Plan is PLEX now, non contrasted CT chest, doppler legs for DVT, sputum culture.  May need to have pulm involved for bronch but will start with these studies.  Asked RN to page me with clinical status changes. 2. 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 Started on daily plasmapheresis and IV solumedrol/po cytoxan. Requiring HD.  1. Biopsy with 100% crescents and significant amount of ATN. 2. [place] continue with HD on MWF schedule for now.  Next HD tomorrow 3. For pheresis with FFP due to possible alveolar hemorrhagedaily.  She has completed2 weeks of PLEX treatments (11 total over 14d); resent GBM Ab 7/13, still elevated and in light of hypoxia plan is PLEX x 3 more cycles.   4. Continue cytoxan 50 mg daily 5. Developed agitation with high dose pred so it was decreased to 40mg  daily, cont for now.  6. Bactrim MWF for PCP prophylaxis 7. Continue to follow for renal recovery, however doubtful given extensive crescents on biopsy.  8. MWF HD arranged at [facility] 3. HTN- BP ^^ in past 24h.  On hydralazine 100 TID, metop 100 BID, amlodipine 10, add clonidine 0.1 TID; has PRN labetalol.  4. Vascular access- RIJ TDC placed 03/20/20 by IR.   VVS placed BC AVF 04/02/20. 5. Thrombocytopenia - noted modest thrombocytopenia this week with plt into 120, but today normal 161. 6. Anemia - Hb 6.7 > 8.1 with transfusion 7/13.  No reported bleeding.  1u pRBC transfused, iron replete. ^ ESA for next dose Friday.  Check tomorrow AM. 7. Disposition- outpt HD at [facility] MWF arranged.  Discharge pending repeat antiGBM to determine further PLEX  Jannifer Hick MD [place] Pager [phone]

## 2020-04-05 NOTE — Progress Notes (Signed)
Pt returned from CT scan; crackles. SpO2 high 80s; pt placed on non rebreather mask. Now Spo2 at 98%. MD paged; ordered stat lasix and a thoracentesis to be done.

## 2020-04-05 NOTE — Progress Notes (Signed)
Pt going to CT

## 2020-04-05 NOTE — Progress Notes (Signed)
CRITICAL VALUE ALERT  Critical Value:  Lactic Acid 2.4  Date & Time Notied:  04/05/20 1516  Provider Notified: Dr. June Leap, MD  Orders Received/Actions taken: MD Aware  RN will continue to monitor patient at this time.

## 2020-04-05 NOTE — Progress Notes (Signed)
Pharmacy Antibiotic Note  Jasmine White is a 74 y.o. female admitted on 03/17/2020. Pharmacy has been consulted for vancomycin and cefepime dosing for pneumonia. Pt is afebrile and WBC is WNL. Pt with ESRD on HD MWF.   Plan: Vancomycin 1250mg  IV x 1 then 750mg  post-HD Cefepime 2gm IV x 1 then 2gm post-HD F/u renal plans, C&S, clinical status and pre-HD vanc level PRN  Height: 5' 5.98" (167.6 cm) Weight: 66.6 kg (146 lb 13.2 oz) IBW/kg (Calculated) : 59.26  Temp (24hrs), Avg:98.3 F (36.8 C), Min:96.8 F (36 C), Max:98.7 F (37.1 C)  Recent Labs  Lab 04/01/20 0715 04/01/20 0715 04/02/20 0310 04/02/20 0310 04/02/20 1543 04/03/20 0454 04/03/20 1520 04/04/20 0827 04/05/20 0456 04/05/20 1352 04/05/20 1353  WBC 9.1  --  11.1*  --   --  7.2  --   --  9.4  --  9.3  CREATININE 4.87*   < > 6.26*   < > 2.20* 3.52* 4.00* 5.10* 2.99*  --   --   LATICACIDVEN  --   --   --   --   --   --   --   --   --  1.1  --    < > = values in this interval not displayed.    Estimated Creatinine Clearance: 15.5 mL/min (A) (by C-G formula based on SCr of 2.99 mg/dL (H)).    Allergies  Allergen Reactions   Hctz [Hydrochlorothiazide] Other (See Comments)    Hx of severe Hyponatremia while taking HCTZ - now contraindicated   Percocet [Oxycodone-Acetaminophen] Nausea Only   Ultram [Tramadol]     Makes her crazy and nauseated    Antimicrobials this admission: Vanc 7/15>> Cefepime 7/15>>  Dose adjustments this admission: N/A  Microbiology results: 7/15 Blood - NGTD 6/28 Urine - NEG 6/26 MRSA - NEG 6/26 COVID - NEG  Thank you for allowing pharmacy to be a part of this patients care.  Redell Bhandari, Rande Lawman 04/05/2020 2:59 PM

## 2020-04-05 NOTE — Progress Notes (Signed)
OT Cancellation Note  Patient Details Name: LIZANNE ERKER MRN: [id number] DOB: 1946/06/26   Cancelled Treatment:    Reason Eval/Treat Not Completed: Patient at procedure or test/ unavailable.  Second attempt to see patient today.  Patient out of room off floor for attempt at 8:30 and this attempt.  Will follow up tomorrow.  August Luz, OTR/L   Phylliss Bob 04/05/2020, 1:20 PM

## 2020-04-05 NOTE — Procedures (Signed)
Thoracentesis  Procedure Note  Jasmine White  [id number]  1945/10/01  Date:04/05/20  Time:4:00 PM   Provider Performing:Kennon Encinas C Jeffrey Voth directing Dr. Darrick Meigs  Procedure: Thoracentesis with imaging guidance [phone])  Indication(s) Pleural Effusion  Consent Risks of the procedure as well as the alternatives and risks of each were explained to the patient and/or caregiver.  Consent for the procedure was obtained and is signed in the bedside chart  Anesthesia Topical only with 1% lidocaine    Time Out Verified patient identification, verified procedure, site/side was marked, verified correct patient position, special equipment/implants available, medications/allergies/relevant history reviewed, required imaging and test results available.   Sterile Technique Maximal sterile technique including full sterile barrier drape, hand hygiene, sterile gown, sterile gloves, mask, hair covering, sterile ultrasound probe cover (if used).  Procedure Description Ultrasound was used to identify appropriate pleural anatomy for placement and overlying skin marked.  Area of drainage cleaned and draped in sterile fashion. Lidocaine was used to anesthetize the skin and subcutaneous tissue.  900 cc's of straw appearing fluid was drained from the right pleural space. Catheter then removed and bandaid applied to site.   Complications/Tolerance None; patient tolerated the procedure well. Chest X-ray is ordered to confirm no post-procedural complication.   EBL Minimal   Specimen(s) Pleural fluid

## 2020-04-05 NOTE — Consult Note (Signed)
NAME:  Jasmine White, MRN:  [id number], DOB:  1946-09-04, LOS: 53 ADMISSION DATE:  03/17/2020, CONSULTATION DATE:  04/05/20 REFERRING MD:  Mal Misty, CHIEF COMPLAINT:  Dyspnea   Brief History   74 y/o woman with hx of HTN, HLD COVID presenting with fatigue and vomiting presenting with progressive renal failure and now respiratory failure with multiple markers concerning for a rheumatologic process refractory to standard of care now in respiratory distress.  History of present illness   74 y/o woman with hx of HTN, HLD COVID presenting with fatigue and vomiting presenting with progressive renal failure and now respiratory failure with multiple markers concerning for a rheumatologic process refractory to standard of care now in respiratory distress.  She has received plasmapheresis, cytoxan, and steroids as well as bactrim for PCP ppx.  Moreover started on HD 7/1, last treatment yesterday.  Overnight, became more dyspneic, fatigued, using accessory muscles. Admits to having trouble swallowing food recently. PCCM consulted.  Past Medical History  HTN HLD Recent covid infection  Significant Hospital Events   6/27 admitted 6/29 Tunneled HD catheter placement 7/12 AV fistula creation 7/1>> 11 x PLEX treatments  Consults:  Nephrology, IR  Procedures:  See above  Significant Diagnostic Tests:  dsDNA 50 ANA positive ?titer GBM positive 137 (<20 normal) MPO 10 (<9 normal) ESR 135  Micro Data:  COVID neg MRSA PCR neg Blood 7/15>> BAL 7/15>> Urine 7/15>> Pleural fluid 7/15>>  Antimicrobials:  Bactrim ppx >>  Interim history/subjective:  Consulted.  Objective   Blood pressure (!) 158/87, pulse 96, temperature 98.3 F (36.8 C), temperature source Oral, resp. rate (!) 28, height 5' 5.98" (1.676 m), weight 66.6 kg, SpO2 96 %.        Intake/Output Summary (Last 24 hours) at 04/05/2020 1451 Last data filed at 04/04/2020 1743 Gross per 24 hour  Intake --  Output 1500 ml  Net  -1500 ml   Filed Weights   04/03/20 0323 04/04/20 1350 04/04/20 1743  Weight: 71.7 kg 68.1 kg 66.6 kg    Examination: GEN: pale ill appearing woman lying in bed in moderate resp distress HEENT: NRB in place, MMM CV: RRR, ext warm PULM: Severe rhonci bilaterally, + accessory muscle use GI: Soft, +BS EXT: No edema NEURO: Moves all 4 ext to command, weak PSYCH: AOx3 SKIN: Pale   Resolved Hospital Problem list   N/A  Assessment & Plan:  Acute hypoxemic respiratory failure in setting of RPGN with postiive immune markers.  Initial CT c/w DAH so this is a pulmonary-renal syndrome question if induced by COVID.  Repeat CT looks similar but more consolidated question if blood has just clotted off, enlarging effusions noted.  She has been started appropriately on aggressive immunosuppression including steroids, cytoxan, PLEX but respiratory status decompensated regardless.  She appears to be tiring out on NRB. - Intubate, lung protective ventilation, VAP prevention bundle - Bronch with serial BAL, send for usual studies in this immunosuppressed patient - Continue bactrim for PCP ppx - Switch PO steroids back to IV - Check echo, BNP, Pct - R therapeutic and diagnostic thora, can do left tomorrow if significant   Best practice:  Diet: NPO Pain/Anxiety/Delirium protocol (if indicated): propofol, fentanyl VAP protocol (if indicated): In place DVT prophylaxis: heparin GI prophylaxis: PPI Glucose control: SSI Mobility: BR Code Status: Full Family Communication: Updated husband and daughter Disposition: ICU   Labs   CBC: Recent Labs  Lab 04/01/20 0715 04/01/20 0715 04/02/20 0310 04/02/20 1543 04/03/20 0454 04/03/20 1202 04/03/20  1520 04/05/20 0456 04/05/20 1353  WBC 9.1  --  11.1*  --  7.2  --   --  9.4 9.3  NEUTROABS  --   --   --   --   --   --   --   --  8.6*  HGB 7.4*   < > 7.4*   < > 6.7* 8.1* 14.6 8.4* 8.6*  HCT 23.2*   < > 23.0*   < > 21.1* 25.8* 43.0 27.4* 27.5*    MCV 87.5  --  88.8  --  92.5  --   --  91.6 92.3  PLT 124*  --  133*  --  128*  --   --  161 154   < > = values in this interval not displayed.    Basic Metabolic Panel: Recent Labs  Lab 04/01/20 0715 04/01/20 0715 04/02/20 0310 04/02/20 0310 04/02/20 1543 04/03/20 0454 04/03/20 1520 04/04/20 0827 04/05/20 0456  NA 138   < > 139   < > 140 137 136 139 136  K 3.7   < > 3.7   < > 4.3 4.6 4.7 4.0 3.9  CL 93*   < > 94*   < > 95* 96* 93* 97* 98  CO2 33*  --  31  --   --  30  --  29 26  GLUCOSE 96   < > 91   < > 98 87 207* 152* 109*  BUN 38*   < > 51*   < > 15 22 30* 41* 19  CREATININE 4.87*   < > 6.26*   < > 2.20* 3.52* 4.00* 5.10* 2.99*  CALCIUM 8.5*  --  8.7*  --   --  8.0*  --  8.0* 8.0*  PHOS 4.2  --  4.4  --   --  3.8  --  4.4 3.2   < > = values in this interval not displayed.   GFR: Estimated Creatinine Clearance: 15.5 mL/min (A) (by C-G formula based on SCr of 2.99 mg/dL (H)). Recent Labs  Lab 04/02/20 0310 04/03/20 0454 04/05/20 0456 04/05/20 1352 04/05/20 1353  WBC 11.1* 7.2 9.4  --  9.3  LATICACIDVEN  --   --   --  1.1  --     Liver Function Tests: Recent Labs  Lab 04/01/20 0715 04/02/20 0310 04/03/20 0454 04/04/20 0827 04/05/20 0456  ALBUMIN 2.8* 2.9* 2.8* 2.9* 3.0*   No results for input(s): LIPASE, AMYLASE in the last 168 hours. No results for input(s): AMMONIA in the last 168 hours.  ABG    Component Value Date/Time   TCO2 26 04/03/2020 1520     Coagulation Profile: Recent Labs  Lab 04/02/20 0310  INR 1.0    Cardiac Enzymes: No results for input(s): CKTOTAL, CKMB, CKMBINDEX, TROPONINI in the last 168 hours.  HbA1C: Hgb A1c MFr Bld  Date/Time Value Ref Range Status  03/21/2020 07:31 PM 5.9 (H) 4.8 - 5.6 % Final    Comment:    (NOTE) Pre diabetes:          5.7%-6.4%  Diabetes:              >6.4%  Glycemic control for   <7.0% adults with diabetes     CBG: No results for input(s): GLUCAP in the last 168 hours.  Review of  Systems:    Positive Symptoms in bold:  Constitutional fevers, chills, weight loss, fatigue, anorexia, malaise  Eyes decreased vision, double vision, eye  irritation  Ears, Nose, Mouth, Throat sore throat, trouble swallowing, sinus congestion  Cardiovascular chest pain, paroxysmal nocturnal dyspnea, lower ext edema, palpitations   Respiratory SOB, cough, DOE, hemoptysis, wheezing  Gastrointestinal nausea, vomiting, diarrhea  Genitourinary burning with urination, trouble urinating  Musculoskeletal joint aches, joint swelling, back pain  Integumentary  rashes, skin lesions  Neurological focal weakness, focal numbness, trouble speaking, headaches  Psychiatric depression, anxiety, confusion  Endocrine polyuria, polydipsia, cold intolerance, heat intolerance  Hematologic abnormal bruising, abnormal bleeding, unexplained nose bleeds  Allergic/Immunologic recurrent infections, hives, swollen lymph nodes     Past Medical History  She,  has a past medical history of Arthritis, Bruises easily, Cancer ([place]), Chronic back pain, Hyperlipidemia, Hypertension, Hypothyroidism, Insomnia, Joint pain, Joint swelling, Low BP, Nocturia, PONV (postoperative nausea and vomiting), Sinusitis, and Urinary frequency.   Surgical History    Past Surgical History:  Procedure Laterality Date  . ABDOMINAL HYSTERECTOMY    . AV FISTULA PLACEMENT Left 04/02/2020   Procedure: ARTERIOVENOUS (AV) FISTULA CREATION;  Surgeon: Rosetta Posner, MD;  Location: [place];  Service: Vascular;  Laterality: Left;  . BACK SURGERY    . basal cell skin cancer     on face and forehead  . bil knee replacements    . BREAST BIOPSY     left breast/benign  . COLONOSCOPY    . IR FLUORO GUIDE CV LINE RIGHT  03/20/2020  . IR US GUIDE VASC ACCESS RIGHT  03/20/2020  . LUMBAR LAMINECTOMY/DECOMPRESSION MICRODISCECTOMY  08/13/2012   Procedure: LUMBAR LAMINECTOMY/DECOMPRESSION MICRODISCECTOMY 1 LEVEL;  Surgeon: Erline Levine, MD;  Location: [facility]  [facility];  Service: Neurosurgery;  Laterality: Left;  Left lumbar one-two Microdiskectomy     Social History   reports that she has never smoked. She has never used smokeless tobacco. She reports that she does not drink alcohol and does not use drugs.   Family History   Her family history includes Alcohol abuse in her father; Arthritis in her brother and brother; Cancer (age of onset: 53) in her brother; Heart disease in her father and mother; Hypertension in her mother; Stroke in her father. There is no history of Colon cancer, Colon polyps, Stomach cancer, or Rectal cancer.   Allergies Allergies  Allergen Reactions  . Hctz [Hydrochlorothiazide] Other (See Comments)    Hx of severe Hyponatremia while taking HCTZ - now contraindicated  . Percocet [Oxycodone-Acetaminophen] Nausea Only  . Ultram [Tramadol]     Makes her crazy and nauseated     Home Medications  Prior to Admission medications   Medication Sig Start Date End Date Taking? Authorizing Provider  amitriptyline (ELAVIL) 50 MG tablet Take 1 tablet (50 mg total) by mouth at bedtime. 09/02/19  Yes Martin, [name], FNP  Coenzyme Q10-Vitamin E (QUNOL ULTRA COQ10 PO) Take 1 capsule by mouth daily.   Yes [provider]  GARLIC PO Take 1 capsule by mouth daily.    Yes [provider]  HYDROcodone-acetaminophen (NORCO/VICODIN) 5-325 MG tablet Take 1 tablet by mouth every 6 (six) hours as needed for moderate pain. 03/01/20  Yes Martin, [name], FNP  losartan-hydrochlorothiazide (HYZAAR) 100-12.5 MG tablet Take 1 tablet by mouth daily. 03/01/20  Yes Martin, [name], FNP  LUTEIN PO Take 1 capsule by mouth daily.    Yes [provider]  metoprolol succinate (TOPROL-XL) 100 MG 24 hr tablet Take 1 tablet (100 mg total) by mouth daily. Take with or immediately following a meal. 03/01/20  Yes Hassell Done [name], FNP  Multiple Vitamin (  MULTIVITAMIN WITH MINERALS) TABS tablet Take 1 tablet by mouth daily.    Yes [provider]  Omega-3 Fatty Acids (FISH OIL PO) Take 1 capsule by mouth daily.    Yes [provider]  SYNTHROID 88 MCG tablet TAKE 1 TABLET BY MOUTH ONCE DAILY BEFORE BREAKFAST Patient taking differently: Take 88 mcg by mouth daily before breakfast.  03/01/20  Yes Hassell Done, [name], FNP     Critical care time: 45 minutes not including any separately billable procedures

## 2020-04-05 NOTE — Progress Notes (Signed)
Pt developed SOB early am 7/15.  CXR results at 6am showed bilateral pleural effusion.  Pt on 4L O2 and tachypneic, sleeping.  No improvement with breathing tx.  Pt completed dialysis 7/14 pm.

## 2020-04-05 NOTE — Progress Notes (Signed)
At [place], patient began to complain of shortness of breath and stated that she felt like she was having a panic attack. Denny,NP notified and she placed a one time order for Elavil for the patient's anxiety. The patient stated she usually takes this medication at home every night for anxiety. Patient's oxygen saturation level was 92% to 93% at this time and the patient's lung sounds were clear on room air. Elavil was administered at [place]. The patient then seemed to calm down, relax, and fall asleep.  Then at 0430, patient began to complain of shortness of breath again and her oxygen saturation was ranging 89% to 90% on room air and her lung sounds had crackles in the base. Patient's work of breathing also increased. This RN then notified Denny,NP about the patient's change in status. NP returned page and said to give the patient a PRN albuterol treatment and she ordered a STAT chest x-ray.Chest x-ray was done at 0530.  This RN called and spoke with Lexine Baton, Rapid RN, about the patient's status change. Rapid Response RN went to assess the patient and informed this RN to give the patient another dose of her PRN albuterol treatment per the time on the [facility]. Rapid Response stated for this RN to continue monitoring the patient and she would put the patient on their radar.   Denny,NP called this RN at [phone] to discuss the chest x-ray results and stated that she would leave a note for the daytime physician about the patient's change in status overnight, but at this time, just continue to monitor the patient. This RN made the NP and Rapid Response aware that the patient was now on 4 liters of oxygen satting at 98%.  At 0630, patient resting in the bed. Oncoming shift RN made aware of patient's change in condition overnight.

## 2020-04-05 NOTE — Significant Event (Signed)
Rapid Response Event Note  Overview:  Called by RN about pt c/o worsening SOB throughout the night. Was on RA and is now on 4L [place] with spO2 96%    Initial Focused Assessment: On arrival, pt resting in bed with eyes closed. Arouses to speech, some accessory muscle use noted with wheezing and coarse crackles noted throughout lungs. Pt had just received a PRN neb treatment about an hour prior without relief. Pt does appear to have some anxiety as well. She was given amitriptyline around 0200 which helped pt rest for about 2 hours per RN.   VS: HR 87, BP 180/82, RR 30, spO2 98% on 4L [place]  Interventions: CXR done prior to my arrival- showed bilat effusions (not much changed from prior film) Provider notified- no new orders given I discussed with RT about pt and possible need for Bipap if no improvement. Day shift RT made aware of situation.    Plan of Care (if not transferred): Continue to monitor respiratory status. Plan to go to HD for plasmapheresis between 8-9am. RN to give PRN neb treatment when within proper time frame (around 7am). Instructed RN to call with any changes or concerns. Will follow up.   Event Summary:  called at  0600   Event ended at  [place]

## 2020-04-05 NOTE — Progress Notes (Addendum)
Progress Note    Jasmine White  NGE:[id number] DOB: December 11, 1945  DOA: 03/17/2020 PCP: Chevis Pretty, FNP      Brief Narrative:    Medical records reviewed and are as summarized below:  Jasmine White is a 74 y.o. female  with history of HTN, hypothyroidism, HLD, recently diagnosed by PCP with renal dysfunction, history of COVID-19 infection in November 2020-since then been having persistent fatigue-who presented with persistent nausea/vomiting-further evaluation revealed severe hyponatremia with a sodium of 109, worsening renal function with a creatinine of 7.12- she was admitted-provided supportive care-subsequently nephrology was consulted-she underwent renal biopsy-which showed 100% crescents and significant amount of ATN.  She was subsequently started on plasmapheresis, Solu-Medrol/Cytoxan and also on hemodialysis.       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  Remains on steroids, Cytoxan and Bactrim MWF for PCP prophylaxis.  Due to neurological side effects/hyperactivity-steroids doses being  tapered-seems to be tolerating 40 mg of prednisone relatively well.  s/p left AV fistula creation on 7/12.  Plan discussed with nephrologist, Dr. Johnney Ou.   Alveolar hemorrhage: No hemoptysis-not hypoxic-see CT chest above-started on plasmapheresis daily plasmapheresis x2 weeks (on 13/14 days).  Acute hypoxemic respiratory failure: CT chest without contrast was ordered today.   It showed moderate bilateral pleural effusion, bilateral infiltrates (differential diagnosis include alveolar hemorrhage, pneumonia, pulmonary edema).  Patient was given IV Lasix.  Empiric IV antibiotics (vancomycin  and cefepime) have been ordered as well.  Blood cultures drawn prior to antibiotics.  BiPAP for acute respiratory failure and increased work of breathing.  Consulted pulmonologist to assist with management.  Case discussed with Georges Mouse and Dr. Ina Homes, MD.  Patient has been transferred to the ICU for close monitoring since she may require intubation per critical care team.  Dr. Tamala Julian said he will take over the care of this patient.  Bilateral pleural effusion: ultrasound-guided right-sided thoracentesis with pleural fluid analysis has been ordered.  Elevated ANA/dsDNA levels: Not sure if it's related to ?lupus. outpatient rheumatology follow-up.  Hypertension: Continue antihypertensives  Acute encephalopathy: Secondary to steroids-has improved-steroids being slowly tapered down-seems to be relatively stable on 40 mg of prednisone.  Continue to follow daily.  Hyponatremia: Secondary to hypovolemia/HCTZ use-and AKI-resolved with supportive care.  Normocytic anemia: Probably related to CKD/immunosuppressive agents-s/p total of 3 units of PRBCs thus far.  Last transfusion was on 04/03/2020. SPEP-without M spike.    Constipation: Resolved.  Hypothyroidism: Continue Synthroid-TSH stable.  Deconditioning: Continue PT and OT    Body mass index is 23.71 kg/m.  Diet Order            Diet renal with fluid restriction Fluid restriction: 1200 mL Fluid; Room service appropriate? Yes; Fluid consistency: Thin  Diet effective now                  CRITICAL CARE Performed by: Jennye Boroughs   Total critical care time: 35 minutes  Critical care time was exclusive of separately billable procedures and treating other patients.  Critical care was necessary to treat or prevent imminent or life-threatening deterioration.  Critical care was time spent personally by me on the following activities: development of treatment plan with patient and/or surrogate as well  as nursing,  discussions with consultants, evaluation of patient's response to treatment, examination of patient, obtaining history from patient or surrogate, ordering and performing treatments and interventions, ordering and review of laboratory studies, ordering and review of radiographic studies, pulse oximetry and re-evaluation of patient's condition. care     Medications:   . sodium chloride   Intravenous Once  . sodium chloride   Intravenous Once  . amLODipine  10 mg Oral Daily  . Chlorhexidine Gluconate Cloth  6 each Topical Q0600  . cholecalciferol  1,000 Units Oral Daily  . cloNIDine  0.1 mg Oral TID  . cyclophosphamide  50 mg Oral q1800  . [START ON 04/06/2020] darbepoetin (ARANESP) injection - DIALYSIS  100 mcg Intravenous Q Fri-HD  . docusate sodium  200 mg Oral BID  . heparin injection (subcutaneous)  5,000 Units Subcutaneous Q8H  . hydrALAZINE  100 mg Oral TID  . levothyroxine  88 mcg Oral Q0600  . metoprolol tartrate  100 mg Oral BID  . pantoprazole  40 mg Oral Daily  . polyethylene glycol  17 g Oral BID  . predniSONE  40 mg Oral Q breakfast  . sulfamethoxazole-trimethoprim  1 tablet Oral Q M,W,F   Continuous Infusions: . citrate dextrose 1,000 mL (04/05/20 0934)     Anti-infectives (From admission, onward)   Start     Dose/Rate Route Frequency Ordered Stop   04/02/20 0700  ceFAZolin (ANCEF) IVPB 1 g/50 mL premix       Note to Pharmacy: Send with pt to OR   1 g 100 mL/hr over 30 Minutes Intravenous To [facility] 04/01/20 2111 04/02/20 1607   03/26/20 1000  sulfamethoxazole-trimethoprim (BACTRIM) 400-80 MG per tablet 1 tablet     Discontinue     1 tablet Oral Every M-W-F 03/25/20 0954     03/25/20 2200  sulfamethoxazole-trimethoprim (BACTRIM) 400-80 MG per tablet 1 tablet  Status:  Discontinued        1 tablet Oral Daily at bedtime 03/25/20 0854 03/25/20 0921   03/20/20 1319  ceFAZolin (ANCEF) 2-4 GM/100ML-% IVPB       Note to Pharmacy: Arlean Hopping   : cabinet  override      03/20/20 1319 03/21/20 0129   03/20/20 1100  ceFAZolin (ANCEF) IVPB 2g/100 mL premix        2 g 200 mL/hr over 30 Minutes Intravenous On call 03/20/20 1040 03/20/20 1506   03/19/20 0000  cefTRIAXone (ROCEPHIN) 1 g in sodium chloride 0.9 % 100 mL IVPB       Note to Pharmacy: 1st dose now   1 g 200 mL/hr over 30 Minutes Intravenous Every 24 hours 03/18/20 0127 03/21/20 0008   03/18/20 0145  cefTRIAXone (ROCEPHIN) 1 g in sodium chloride 0.9 % 100 mL IVPB       Note to Pharmacy: 1st dose now   1 g 200 mL/hr over 30 Minutes Intravenous  Once 03/18/20 0135 03/18/20 0252             Family Communication/Anticipated D/C date and plan/Code Status   DVT prophylaxis: heparin injection 5,000 Units Start: 03/23/20 1800 Place and maintain sequential compression device Start: 03/20/20 0746     Code Status: Full Code  Family Communication: Plan discussed with patient Disposition Plan:    Status is: Inpatient  Remains inpatient appropriate because:Inpatient level of care appropriate due to severity of illness   Dispo: The patient is from: Home  Anticipated d/c is to: Home              Anticipated d/c date is: 3 days              Patient currently is not medically stable to d/c.           Subjective:   Overnight events noted. She was on 4L oxygen this morning. C/o shortness of breath. She was having plasmaphersis in the room att he time of my visit. Nephrologist, Dr. Johnney Ou and Garret Reddish, RN( her nurse) and plasmapheresis nurse were at the bedside as well.  Objective:    Vitals:   04/05/20 1140 04/05/20 1230 04/05/20 1245 04/05/20 1353  BP: 135/69 (!) 148/76 (!) 134/97 (!) 158/87  Pulse:  72 96   Resp:  20 20 (!) 28  Temp: 98.7 F (37.1 C) (!) 97.5 F (36.4 C) 98.3 F (36.8 C)   TempSrc:  Oral Oral Axillary  SpO2:  97% 93% 96%  Weight:      Height:       No data found.   Intake/Output Summary (Last 24 hours) at 04/05/2020 1446 Last  data filed at 04/04/2020 1743 Gross per 24 hour  Intake --  Output 1500 ml  Net -1500 ml   Filed Weights   04/03/20 0323 04/04/20 1350 04/04/20 1743  Weight: 71.7 kg 68.1 kg 66.6 kg    Exam:  GEN: NAD SKIN: No rash, flushed in the face EYES: EOMI ENT: MMM CV: RRR PULM: bibasilar rales, b/l wheezing at the bases ABD: soft, ND, NT, +BS CNS: AAO x 3, non focal EXT: No edema or tenderness       Data Reviewed:   I have personally reviewed following labs and imaging studies:  Labs: Labs show the following:   Basic Metabolic Panel: Recent Labs  Lab 04/01/20 0715 04/01/20 0715 04/02/20 0310 04/02/20 0310 04/02/20 1543 04/02/20 1543 04/03/20 0454 04/03/20 0454 04/03/20 1520 04/03/20 1520 04/04/20 0827 04/05/20 0456  NA 138   < > 139   < > 140  --  137  --  136  --  139 136  K 3.7   < > 3.7   < > 4.3   < > 4.6   < > 4.7   < > 4.0 3.9  CL 93*   < > 94*   < > 95*  --  96*  --  93*  --  97* 98  CO2 33*  --  31  --   --   --  30  --   --   --  29 26  GLUCOSE 96   < > 91   < > 98  --  87  --  207*  --  152* 109*  BUN 38*   < > 51*   < > 15  --  22  --  30*  --  41* 19  CREATININE 4.87*   < > 6.26*   < > 2.20*  --  3.52*  --  4.00*  --  5.10* 2.99*  CALCIUM 8.5*  --  8.7*  --   --   --  8.0*  --   --   --  8.0* 8.0*  PHOS 4.2  --  4.4  --   --   --  3.8  --   --   --  4.4 3.2   < > = values in this interval not displayed.   GFR Estimated Creatinine Clearance: 15.5  mL/min (A) (by C-G formula based on SCr of 2.99 mg/dL (H)). Liver Function Tests: Recent Labs  Lab 04/01/20 0715 04/02/20 0310 04/03/20 0454 04/04/20 0827 04/05/20 0456  ALBUMIN 2.8* 2.9* 2.8* 2.9* 3.0*   No results for input(s): LIPASE, AMYLASE in the last 168 hours. No results for input(s): AMMONIA in the last 168 hours. Coagulation profile Recent Labs  Lab 04/02/20 0310  INR 1.0    CBC: Recent Labs  Lab 04/01/20 0715 04/01/20 0715 04/02/20 0310 04/02/20 1543 04/03/20 0454  04/03/20 1202 04/03/20 1520 04/05/20 0456 04/05/20 1353  WBC 9.1  --  11.1*  --  7.2  --   --  9.4 9.3  NEUTROABS  --   --   --   --   --   --   --   --  8.6*  HGB 7.4*   < > 7.4*   < > 6.7* 8.1* 14.6 8.4* 8.6*  HCT 23.2*   < > 23.0*   < > 21.1* 25.8* 43.0 27.4* 27.5*  MCV 87.5  --  88.8  --  92.5  --   --  91.6 92.3  PLT 124*  --  133*  --  128*  --   --  161 154   < > = values in this interval not displayed.   Cardiac Enzymes: No results for input(s): CKTOTAL, CKMB, CKMBINDEX, TROPONINI in the last 168 hours. BNP (last 3 results) No results for input(s): PROBNP in the last 8760 hours. CBG: No results for input(s): GLUCAP in the last 168 hours. D-Dimer: No results for input(s): DDIMER in the last 72 hours. Hgb A1c: No results for input(s): HGBA1C in the last 72 hours. Lipid Profile: No results for input(s): CHOL, HDL, LDLCALC, TRIG, CHOLHDL, LDLDIRECT in the last 72 hours. Thyroid function studies: No results for input(s): TSH, T4TOTAL, T3FREE, THYROIDAB in the last 72 hours.  Invalid input(s): FREET3 Anemia work up: No results for input(s): VITAMINB12, FOLATE, FERRITIN, TIBC, IRON, RETICCTPCT in the last 72 hours. Sepsis Labs: Recent Labs  Lab 04/02/20 0310 04/03/20 0454 04/05/20 0456 04/05/20 1352 04/05/20 1353  WBC 11.1* 7.2 9.4  --  9.3  LATICACIDVEN  --   --   --  1.1  --     Microbiology No results found for this or any previous visit (from the past 240 hour(s)).  Procedures and diagnostic studies:  CT CHEST WO CONTRAST  Result Date: 04/05/2020 CLINICAL DATA:  Hypoxia EXAM: CT CHEST WITHOUT CONTRAST TECHNIQUE: Multidetector CT imaging of the chest was performed following the standard protocol without IV contrast. COMPARISON:  Chest radiograph April 05, 2020; chest CT March 24, 2020. FINDINGS: Cardiovascular: There is no thoracic aortic aneurysm. There are scattered foci of calcification in visualized great vessels, most notably in the left proximal subclavian  artery. There are foci of aortic atherosclerosis. There are foci of coronary artery calcification. A small amount of pericardial fluid is within physiologic range. No pericardial thickening is evident. Central catheter tip is near the cavoatrial junction. Mediastinum/Nodes: Thyroid is unremarkable in appearance. There are multiple subcentimeter mediastinal lymph nodes. No adenopathy by size criteria evident. No esophageal lesions are appreciable. Lungs/Pleura: There are sizable free-flowing pleural effusions bilaterally. There is compressive atelectatic change in both lung bases. There is extensive airspace opacity throughout the upper lobes, significantly increased from prior CT. There is airspace consolidation in the right lower lobe as well. There is patchy infiltrate in the lingula as well as atelectatic change more inferiorly in the  lingula and right middle lobe regions. No pneumothorax. Upper Abdomen: There is upper abdominal aortic atherosclerosis. Visualized upper abdominal structures otherwise appear unremarkable. Musculoskeletal: There are foci of degenerative change in the lumbar spine. There is a large Schmorl's node superiorly at L1. No blastic or lytic bone lesions are evident. There are no chest wall lesions appreciable. IMPRESSION: 1. Sizable free-flowing pleural effusions bilaterally. Compressive atelectasis in both lower lobes. Extensive multifocal airspace opacity, most severe in the upper lobes bilaterally with progression bilaterally compared to most recent CT examination. Areas of patchy atelectasis also seen. 2. There is aortic atherosclerosis as well as foci of coronary artery calcification. Probable hemodynamically significant obstruction due to calcification in the proximal left subclavian artery. 3.  Central catheter tip near cavoatrial junction. 4.  No adenopathy by size criteria. Aortic Atherosclerosis (ICD10-I70.0). Electronically Signed   By: Lowella Grip III M.D.   On: 04/05/2020  13:29   DG CHEST PORT 1 VIEW  Result Date: 04/05/2020 CLINICAL DATA:  Shortness of breath. EXAM: PORTABLE CHEST 1 VIEW COMPARISON:  03/22/2020 FINDINGS: The right IJ dialysis catheter is in good position, unchanged. The cardiac silhouette, mediastinal and hilar contours are within normal limits. Patchy asymmetric airspace process in the lungs along with bilateral pleural effusions. No pneumothorax. IMPRESSION: Patchy asymmetric airspace process and bilateral pleural effusions. Electronically Signed   By: Marijo Sanes M.D.   On: 04/05/2020 05:54               LOS: 19 days   Massimiliano Rohleder  [facility]     04/05/2020, 2:46 PM

## 2020-04-05 NOTE — Anesthesia Postprocedure Evaluation (Signed)
Anesthesia Post Note  Patient: Jasmine White  Procedure(s) Performed: ARTERIOVENOUS (AV) FISTULA CREATION (Left Arm Lower)     Patient location during evaluation: PACU Anesthesia Type: General Level of consciousness: sedated and patient cooperative Pain management: pain level controlled Vital Signs Assessment: post-procedure vital signs reviewed and stable Respiratory status: spontaneous breathing Cardiovascular status: stable Anesthetic complications: no   No complications documented.  Last Vitals:  Vitals:   04/05/20 0522 04/05/20 0727  BP:  (!) 186/82  Pulse: 98 (!) 103  Resp:  (!) 26  Temp:  36.8 C  SpO2: 96% 95%    Last Pain:  Vitals:   04/05/20 0727  TempSrc: Oral  PainSc:                  [name]

## 2020-04-05 NOTE — Progress Notes (Signed)
CRITICAL VALUE ALERT  Critical Value:  PH 7.63  Date & Time Notied:  04/05/20 1750  Provider Notified: Dr. June Leap, MD  Orders Received/Actions taken: MD ordered resp. To change vent settings.   RN will continue to monitor patient at this time.

## 2020-04-05 NOTE — Procedures (Signed)
Bronchoscopy Procedure Note  PATSIE MCCARDLE  [id number]  [birth year]  Date:04/05/20  Time:4:00 PM   Provider Performing:Viren Lebeau C Tamala Julian   Procedure(s):  Flexible bronchoscopy with bronchial alveolar lavage [phone])  Indication(s) ?Jasmine White  Consent Risks of the procedure as well as the alternatives and risks of each were explained to the patient and/or caregiver.  Consent for the procedure was obtained and is signed in the bedside chart  Anesthesia Already in place for intubation   Time Out Verified patient identification, verified procedure, site/side was marked, verified correct patient position, special equipment/implants available, medications/allergies/relevant history reviewed, required imaging and test results available.   Sterile Technique Usual hand hygiene, masks, gowns, and gloves were used   Procedure Description Bronchoscope advanced through endotracheal tube and into airway.  Airways were examined down to subsegmental level with findings noted below.   Following diagnostic evaluation, BAL performed in RUL with serial lavage showing lack of clearing, overall would call this mild alveolitis (see photo).  Specimens combined and sent to lab  Findings:  Diffuse airway edema without erythema Good position of ETT Mild alveolitis   Complications/Tolerance None; patient tolerated the procedure well. Chest X-ray is not needed post procedure.   EBL Minimal   Specimen(s) BAL

## 2020-04-06 ENCOUNTER — Inpatient Hospital Stay (HOSPITAL_COMMUNITY): Payer: Medicare PPO

## 2020-04-06 DIAGNOSIS — R0489 Hemorrhage from other sites in respiratory passages: Secondary | ICD-10-CM

## 2020-04-06 DIAGNOSIS — I5023 Acute on chronic systolic (congestive) heart failure: Secondary | ICD-10-CM

## 2020-04-06 DIAGNOSIS — N179 Acute kidney failure, unspecified: Secondary | ICD-10-CM

## 2020-04-06 LAB — THERAPEUTIC PLASMA EXCHANGE (BLOOD BANK)
Plasma volume needed: 2976
Unit division: 0
Unit division: 0
Unit division: 0
Unit division: 0
Unit division: 0
Unit division: 0
Unit division: 0
Unit division: 0
Unit division: 0

## 2020-04-06 LAB — CBC
HCT: 25 % — ABNORMAL LOW (ref 36.0–46.0)
Hemoglobin: 7.7 g/dL — ABNORMAL LOW (ref 12.0–15.0)
MCH: 28.7 pg (ref 26.0–34.0)
MCHC: 30.8 g/dL (ref 30.0–36.0)
MCV: 93.3 fL (ref 80.0–100.0)
Platelets: 154 10*3/uL (ref 150–400)
RBC: 2.68 MIL/uL — ABNORMAL LOW (ref 3.87–5.11)
RDW: 17.3 % — ABNORMAL HIGH (ref 11.5–15.5)
WBC: 7.8 10*3/uL (ref 4.0–10.5)
nRBC: 0 % (ref 0.0–0.2)

## 2020-04-06 LAB — ECHOCARDIOGRAM COMPLETE
Area-P 1/2: 4.6 cm2
Height: 66 in
MV M vel: 5.84 m/s
MV Peak grad: 136.4 mmHg
P 1/2 time: 185 msec
Radius: 0.5 cm
S' Lateral: 3.3 cm
Weight: 2292.78 oz

## 2020-04-06 LAB — RENAL FUNCTION PANEL
Albumin: 3 g/dL — ABNORMAL LOW (ref 3.5–5.0)
Anion gap: 12 (ref 5–15)
BUN: 32 mg/dL — ABNORMAL HIGH (ref 8–23)
CO2: 30 mmol/L (ref 22–32)
Calcium: 8.2 mg/dL — ABNORMAL LOW (ref 8.9–10.3)
Chloride: 96 mmol/L — ABNORMAL LOW (ref 98–111)
Creatinine, Ser: 4.31 mg/dL — ABNORMAL HIGH (ref 0.44–1.00)
GFR calc Af Amer: 11 mL/min — ABNORMAL LOW (ref >60)
GFR calc non Af Amer: 9 mL/min — ABNORMAL LOW (ref >60)
Glucose, Bld: 103 mg/dL — ABNORMAL HIGH (ref 70–99)
Phosphorus: 5.8 mg/dL — ABNORMAL HIGH (ref 2.5–4.6)
Potassium: 5.1 mmol/L (ref 3.5–5.1)
Sodium: 138 mmol/L (ref 135–145)

## 2020-04-06 LAB — CYTOLOGY - NON PAP

## 2020-04-06 LAB — GLUCOSE, CAPILLARY
Glucose-Capillary: 94 mg/dL (ref 70–99)
Glucose-Capillary: 94 mg/dL (ref 70–99)
Glucose-Capillary: 95 mg/dL (ref 70–99)
Glucose-Capillary: 84 mg/dL (ref 70–99)
Glucose-Capillary: 133 mg/dL — ABNORMAL HIGH (ref 70–99)
Glucose-Capillary: 128 mg/dL — ABNORMAL HIGH (ref 70–99)

## 2020-04-06 LAB — MAGNESIUM: Magnesium: 2 mg/dL (ref 1.7–2.4)

## 2020-04-06 LAB — PROCALCITONIN: Procalcitonin: 0.42 ng/mL

## 2020-04-06 LAB — EXPECTORATED SPUTUM ASSESSMENT W GRAM STAIN, RFLX TO RESP C

## 2020-04-06 MED ORDER — ANTICOAGULANT SODIUM CITRATE 4% (200MG/5ML) IV SOLN
5.0000 mL | Freq: Once | Status: AC
  Administered 2020-04-06: 3.2 mL
  Filled 2020-04-06: qty 5

## 2020-04-06 MED ORDER — ACD FORMULA A 0.73-2.45-2.2 GM/100ML VI SOLN
1000.0000 mL | Status: DC
  Filled 2020-04-06: qty 1000

## 2020-04-06 MED ORDER — ACD FORMULA A 0.73-2.45-2.2 GM/100ML VI SOLN
Status: AC
  Administered 2020-04-06: 1000 mL
  Filled 2020-04-06: qty 500

## 2020-04-06 MED ORDER — ANTICOAGULANT SODIUM CITRATE 4% (200MG/5ML) IV SOLN
5.0000 mL | Freq: Once | Status: AC
  Administered 2020-04-06: 5 mL
  Filled 2020-04-06: qty 5

## 2020-04-06 MED ORDER — DARBEPOETIN ALFA 100 MCG/0.5ML IJ SOSY
PREFILLED_SYRINGE | INTRAMUSCULAR | Status: AC
  Administered 2020-04-06: 100 ug via INTRAVENOUS
  Filled 2020-04-06: qty 0.5

## 2020-04-06 MED ORDER — ACETAMINOPHEN 325 MG PO TABS
ORAL_TABLET | ORAL | Status: AC
  Filled 2020-04-06: qty 2

## 2020-04-06 MED ORDER — ACETAMINOPHEN 325 MG PO TABS: 650.0000 mg | ORAL_TABLET | ORAL | Status: DC | PRN

## 2020-04-06 MED ORDER — CALCIUM GLUCONATE-NACL 2-0.675 GM/100ML-% IV SOLN
2.0000 g | Freq: Once | INTRAVENOUS | Status: AC
  Administered 2020-04-06: 2000 mg via INTRAVENOUS
  Filled 2020-04-06: qty 100

## 2020-04-06 MED ORDER — VITAL AF 1.2 CAL PO LIQD
1000.0000 mL | ORAL | Status: DC
  Administered 2020-04-06 – 2020-04-07 (×2): 1000 mL

## 2020-04-06 MED ORDER — SODIUM CHLORIDE 0.9% FLUSH: 10.0000 mL | INTRAVENOUS | Status: DC | PRN

## 2020-04-06 MED ORDER — PROSOURCE TF PO LIQD
45.0000 mL | Freq: Three times a day (TID) | ORAL | Status: DC
  Administered 2020-04-06 – 2020-04-07 (×5): 45 mL
  Filled 2020-04-06 (×5): qty 45

## 2020-04-06 MED ORDER — DIPHENHYDRAMINE HCL 25 MG PO CAPS
25.0000 mg | ORAL_CAPSULE | Freq: Four times a day (QID) | ORAL | Status: DC | PRN
  Administered 2020-04-06 (×2): 25 mg via ORAL

## 2020-04-06 MED ORDER — HEPARIN SODIUM (PORCINE) 1000 UNIT/ML IJ SOLN
INTRAMUSCULAR | Status: AC
  Administered 2020-04-06: 3200 [IU]
  Filled 2020-04-06: qty 4

## 2020-04-06 MED ORDER — CALCIUM CARBONATE ANTACID 500 MG PO CHEW
CHEWABLE_TABLET | ORAL | Status: AC
  Filled 2020-04-06: qty 2

## 2020-04-06 MED ORDER — SODIUM CHLORIDE 0.9% FLUSH
10.0000 mL | Freq: Two times a day (BID) | INTRAVENOUS | Status: DC
  Administered 2020-04-06 – 2020-04-09 (×7): 10 mL

## 2020-04-06 MED ORDER — CALCIUM CARBONATE ANTACID 500 MG PO CHEW
2.0000 | CHEWABLE_TABLET | ORAL | Status: AC
  Administered 2020-04-06 (×2): 400 mg via ORAL
  Filled 2020-04-06 (×2): qty 2

## 2020-04-06 MED ORDER — DIPHENHYDRAMINE HCL 25 MG PO CAPS
ORAL_CAPSULE | ORAL | Status: AC
  Filled 2020-04-06: qty 1

## 2020-04-06 NOTE — Progress Notes (Signed)
Initial Nutrition Assessment  DOCUMENTATION CODES:   Not applicable  INTERVENTION:   Initiate tube feeding via OG tube: Vital AF 1.2 at 40 ml/h (960 ml per day) Prosource TF 45 ml TID  Provides 1272 kcal (1425 kcal total with propofol), 105 gm protein, 779 ml free water daily  NUTRITION DIAGNOSIS:   Increased nutrient needs related to acute illness as evidenced by estimated needs.  GOAL:   Patient will meet greater than or equal to 90% of their needs  MONITOR:   Vent status, TF tolerance, I & O's  REASON FOR ASSESSMENT:   Ventilator, Consult Enteral/tube feeding initiation and management  ASSESSMENT:   74 yo female admitted with nausea and vomiting, severe hypovolemic hyponatremia in the setting of HCTZ use and AKI. PMH includes chronic back pain, HTN, hypothyroidism, COVID (07/2019), CKD.   Discussed patient in ICU rounds and with RN today. OG tube in place. RD to order TF today. 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 GBM Ab was still elevated on 7/13, so plans to continue PLEX for 3 more cycles. Also receiving cytoxan.  Ongoing renal failure, renal recovery is not likely.  7/01 - HD initiated 7/15 - Patient developed respiratory failure and required intubation 7/15 - S/P bronchoscopy with findings c/w alveolitis  Patient is currently intubated on ventilator support MV: 7.2 L/min Temp (24hrs), Avg:97.7 F (36.5 C), Min:96.8 F (36 C), Max:99.2 F (37.3 C)  Propofol: 5.8 ml/hr providing 153 kcal from lipid  Labs reviewed. BUN 32, creat 4.31, phos 5.8 CBG: 94-95  Medications reviewed and include fentanyl, propofol, vitamin D, cytoxan, aranesp, colace, novolog, solu-medrol, miralax.  Admission weight 61.2 kg (6/26), fluctuating with fluids, currently 65 kg. I/O -4.1 L since admission.   NUTRITION - FOCUSED PHYSICAL EXAM:    Most Recent Value  Orbital Region No depletion  Upper Arm Region No depletion  Thoracic  and Lumbar Region No depletion  Buccal Region No depletion  Temple Region No depletion  Clavicle Bone Region No depletion  Clavicle and Acromion Bone Region Mild depletion  Scapular Bone Region Unable to assess  Dorsal Hand Mild depletion  Patellar Region No depletion  Anterior Thigh Region No depletion  Posterior Calf Region Mild depletion  Edema (RD Assessment) None  Hair Reviewed  Eyes Reviewed  Mouth Unable to assess  Skin Reviewed  Nails Reviewed       Diet Order:   Diet Order            Diet NPO time specified  Diet effective now                 EDUCATION NEEDS:   No education needs have been identified at this time  Skin:  Skin Assessment: Reviewed RN Assessment  Last BM:  7/14  Height:   Ht Readings from Last 1 Encounters:  04/05/20 5\' 6"  (1.676 m)    Weight:   Wt Readings from Last 1 Encounters:  04/06/20 65 kg    Ideal Body Weight:  59.1 kg  BMI:  Body mass index is 23.13 kg/m.  Estimated Nutritional Needs:   Kcal:  1365  Protein:  95-110 gm  Fluid:  1 L + UOP    Lucas Mallow, RD, LDN, CNSC Please refer to Amion for contact information.

## 2020-04-06 NOTE — Progress Notes (Addendum)
NAME:  Jasmine White, MRN:  [id number], DOB:  06-Jun-1946, LOS: 68 ADMISSION DATE:  03/17/2020, CONSULTATION DATE:  04/05/20 REFERRING MD:  Mal Misty, CHIEF COMPLAINT:  Dyspnea   Brief History   74 y/o woman with hx of HTN, HLD COVID presenting with fatigue and vomiting presenting with progressive renal failure and now respiratory failure with multiple markers concerning for a rheumatologic process refractory to standard of care now in respiratory distress.  History of present illness   74 y/o woman with hx of HTN, HLD COVID presenting with fatigue and vomiting presenting with progressive renal failure and now respiratory failure with multiple markers concerning for a rheumatologic process refractory to standard of care now in respiratory distress.  She has received plasmapheresis, cytoxan, and steroids as well as bactrim for PCP ppx.  Moreover started on HD 7/1, last treatment yesterday.  Overnight, became more dyspneic, fatigued, using accessory muscles. Admits to having trouble swallowing food recently. PCCM consulted.  Past Medical History  HTN HLD Recent covid infection  Significant Hospital Events   6/27 admitted 6/29 Tunneled HD catheter placement 7/12 AV fistula creation 7/1>> 11 x PLEX treatments  Consults:  Nephrology, IR, PCCM  Procedures:  6/29 IR tunneled HD cath 7/15 thora 7/15 ETT> 7/15 bronch>  Significant Diagnostic Tests:  dsDNA 50 ANA positive ?titer GBM positive 137 (<20 normal) MPO 10 (<9 normal) ESR 135  Micro Data:  COVID neg MRSA PCR neg Blood 7/15>> BAL 7/15>> Urine 7/15>> Pleural fluid 7/15>>  Antimicrobials:  Bactrim ppx >>  Interim history/subjective:  NAEO On PSV this morning, receiving ECHO   Objective   Blood pressure 134/87, pulse (!) 106, temperature (!) 97.5 F (36.4 C), temperature source Oral, resp. rate 14, height _0  (1.676 m), weight 65 kg, SpO2 100 %.    Vent Mode: PSV;CPAP FiO2 (%):  [40 %-60 %] 40 % Set Rate:   [14 bmp-18 bmp] 14 bmp Vt Set:  [450 mL-480 mL] 450 mL PEEP:  [5 cmH20] 5 cmH20 Pressure Support:  [5 cmH20] 5 cmH20 Plateau Pressure:  [18 cmH20-19 cmH20] 18 cmH20   Intake/Output Summary (Last 24 hours) at 04/06/2020 0931 Last data filed at 04/06/2020 0800 Gross per 24 hour  Intake 168.53 ml  Output 0 ml  Net 168.53 ml   Filed Weights   04/04/20 1743 04/05/20 1453 04/06/20 0447  Weight: 66.6 kg 64.3 kg 65 kg    Examination: GEN: Chronically ill appearing older adult F, intubated lightly sedated NAD  HEENT: ETT secure. NCAT, pink mmm. Trachea midline CV: RRR s1s2 no rgm cap refill < 3 seconds  PULM: Mechanically ventilated. Coarse bilaterally with rhonchi. No accessory muscle use on PSV GI: soft round + bowel sounds  EXT: No obvious joint deformity. No edema. No clubbing. LUE AVF NEURO: Awake alert, following commands and communicative via hand motions, nodding  PSYCH: Calm, engaged.  SKIN: Pale, scattered ecchymosis. Clean, warm, dry.    Resolved Hospital Problem list   N/A  Assessment & Plan:   Acute hypoxemic respiratory failure in setting of RPGN with postiive immune markers.   Right sided pleural effusion, s/p thora 7/15 -Initial CT c/w DAH so this is a pulmonary-renal syndrome question if induced by COVID.   -Repeat CT looks similar but more consolidated question if blood has just clotted off, enlarging effusions noted.  She has been started appropriately on aggressive immunosuppression including steroids, cytoxan, PLEX but respiratory status decompensated regardless.   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 P -appreciate nephrology following, HD  -no evidence of renal recovery thusfar  HTN P -ICU  monitoring -labetalol, metoprolol   Hypothyroidism P -synthorid  Hyperglycemia -critical illness, steroids P -SSI   Best practice:  Diet: NPO Pain/Anxiety/Delirium protocol (if indicated): propofol, fentanyl VAP protocol (if indicated): In place DVT prophylaxis: heparin GI prophylaxis: PPI Glucose control: SSI Mobility: BR Code Status: Full Family Communication: Patient updated 7/16, family communication pending  Disposition: ICU   Labs   CBC: Recent Labs  Lab 04/02/20 0310 04/02/20 1543 04/03/20 0454 04/03/20 1202 04/03/20 1520 04/05/20 0456 04/05/20 1353 04/05/20 2034 04/06/20 0701  WBC 11.1*  --  7.2  --   --  9.4 9.3  --  7.8  NEUTROABS  --   --   --   --   --   --  8.6*  --   --   HGB 7.4*   < > 6.7*   < > 14.6 8.4* 8.6* 7.5* 7.7*  HCT 23.0*   < > 21.1*   < > 43.0 27.4* 27.5* 22.0* 25.0*  MCV 88.8  --  92.5  --   --  91.6 92.3  --  93.3  PLT 133*  --  128*  --   --  161 154  --  154   < > = values in this interval not displayed.    Basic Metabolic Panel: Recent Labs  Lab 04/02/20 0310 04/02/20 1543 04/03/20 0454 04/03/20 0454 04/03/20 1520 04/04/20 0827 04/05/20 0456 04/05/20 2034 04/06/20 0701  NA 139   < > 137   < > 136 139 136 137 138  K 3.7   < > 4.6   < > 4.7 4.0 3.9 5.0 5.1  CL 94*   < > 96*  --  93* 97* 98  --  96*  CO2 31  --  30  --   --  29 26  --  30  GLUCOSE 91   < > 87  --  207* 152* 109*  --  103*  BUN 51*   < > 22  --  30* 41* 19  --  32*  CREATININE 6.26*   < > 3.52*  --  4.00* 5.10* 2.99*  --  4.31*  CALCIUM 8.7*  --  8.0*  --   --  8.0* 8.0*  --  8.2*  MG  --   --   --   --   --   --   --   --  2.0  PHOS 4.4  --  3.8  --   --  4.4 3.2  --  5.8*   < > = values in this interval not displayed.   GFR: Estimated Creatinine Clearance: 10.7 mL/min (A) (by C-G formula based on SCr of 4.31 mg/dL (H)). Recent Labs  Lab 04/03/20 0454 04/05/20 0456 04/05/20 1352 04/05/20 1353 04/05/20 1638 04/06/20 0701  PROCALCITON  --   --    --  0.38 0.37 0.42  WBC 7.2 9.4  --  9.3  --  7.8  LATICACIDVEN  --   --  1.1  --  2.4*  --     Liver Function Tests: Recent Labs  Lab 04/02/20 0310 04/03/20 0454 04/04/20 0827 04/05/20 0456  04/05/20 1353 04/06/20 0701  PROT  --   --   --   --  5.6*  --   ALBUMIN 2.9* 2.8* 2.9* 3.0*  --  3.0*   No results for input(s): LIPASE, AMYLASE in the last 168 hours. No results for input(s): AMMONIA in the last 168 hours.  ABG    Component Value Date/Time   PHART 7.551 (H) 04/05/2020 2034   PCO2ART 40.7 04/05/2020 2034   PO2ART 167 (H) 04/05/2020 2034   HCO3 35.8 (H) 04/05/2020 2034   TCO2 37 (H) 04/05/2020 2034   O2SAT 100.0 04/05/2020 2034     Coagulation Profile: Recent Labs  Lab 04/02/20 0310  INR 1.0    Cardiac Enzymes: No results for input(s): CKTOTAL, CKMB, CKMBINDEX, TROPONINI in the last 168 hours.  HbA1C: Hgb A1c MFr Bld  Date/Time Value Ref Range Status  03/21/2020 07:31 PM 5.9 (H) 4.8 - 5.6 % Final    Comment:    (NOTE) Pre diabetes:          5.7%-6.4%  Diabetes:              >6.4%  Glycemic control for   <7.0% adults with diabetes     CBG: Recent Labs  Lab 04/05/20 1546 04/05/20 1924 04/05/20 2311 04/06/20 0314 04/06/20 0724  GLUCAP 148* 95 109* 94 94    CRITICAL CARE Performed by: Cristal Generous  Total critical care time: 43 minutes  Critical care time was exclusive of separately billable procedures and treating other patients. Critical care was necessary to treat or prevent imminent or life-threatening deterioration.  Critical care was time spent personally by me on the following activities: development of treatment plan with patient and/or surrogate as well as nursing, discussions with consultants, evaluation of patient's response to treatment, examination of patient, obtaining history from patient or surrogate, ordering and performing treatments and interventions, ordering and review of laboratory studies, ordering and review of  radiographic studies, pulse oximetry and re-evaluation of patient's condition.  Eliseo Gum MSN, AGACNP-BC [place] [phone] If no answer, [phone] 04/06/2020, 9:31 AM   PCCM:  74 yo FM with AHRF, RPGN, DAH and alveolitis, s/p cytoxan and plex. Now intubated on life support  BP (!) 146/92   Pulse 85   Temp (!) 97.5 F (36.4 C) (Oral)   Resp 14   Ht _0  (1.676 m)   Wt 65 kg   SpO2 100%   BMI 23.13 kg/m   Gen: elderly fm, on vent, awake and alert  HENT: tracking, following commands, ett in place  Heart: rrr s1 s2 Lungs: CTAB, bl vented breaths, no blood in ett Abd: soft, nt nd   Labs: reviewed., scr 4, hgb 7.7  CT chest reviewed: BL effusions, extensive air space disease  The patient's images have been independently reviewed by me.    A:  AHRF 2/2 alveolitis, RPGN, +ANCA +MPO, +ANA, +dsDNA S/p steroids, cytoxan and ongoing PLEX  S/p bronch with mild DAH (but she has had ongoing therapy)   P: Remains in ICU on full vent support  PLEX per nephrology  Effusion drained  Continue steroids  Bactrim PPX for PCP  Pending any improvments in cxr before consideration of liberation from vent   Discussed case with nephrology   This patient is critically ill with multiple organ system failure; which, requires frequent high complexity decision making, assessment, support, evaluation, and titration of therapies. This was completed through the application of advanced monitoring technologies and extensive  interpretation of multiple databases. During this encounter critical care time was devoted to patient care services described in this note for 36 minutes.  [facility] 04/06/2020 10:56 AM

## 2020-04-06 NOTE — Progress Notes (Signed)
  Echocardiogram 2D Echocardiogram has been performed.  Faithanne Verret G Sherrin Stahle 04/06/2020, 10:38 AM

## 2020-04-06 NOTE — Progress Notes (Signed)
OT Cancellation Note  Patient Details Name: ALURA OLVEDA MRN: [id number] DOB: 1946/06/17   Cancelled Treatment:    Reason Eval/Treat Not Completed: Patient not medically ready ((pt intubated and not currently medically appropriate)  Jeri Modena 04/06/2020, 1:11 PM

## 2020-04-06 NOTE — Progress Notes (Signed)
Patient ID: Jasmine White, female   DOB: March 26, 1946, 74 y.o.   MRN: [id number] S:   To ICU, intubated overnight, on PSV this AM  Bronch with findings consistent with alveolitis, also concern for aspiration  O:BP 134/87   Pulse (!) 106   Temp (!) 97.5 F (36.4 C) (Oral)   Resp 14   Ht '5\' 6"'$  (1.676 m)   Wt 65 kg   SpO2 100%   BMI 23.13 kg/m   Intake/Output Summary (Last 24 hours) at 04/06/2020 0927 Last data filed at 04/06/2020 0800 Gross per 24 hour  Intake 168.53 ml  Output 0 ml  Net 168.53 ml   Intake/Output: I/O last 3 completed shifts: In: 159.5 [I.V.:159.5] Out: 0   Intake/Output this shift:  Total I/O In: 9.1 [I.V.:9.1] Out: -  Weight change: -3.8 kg Gen: intubated, awake and alert R IJ TDC C/D/I CVS: no rub Resp: ^ WOB, coarse ant, dec BS bases with a few rales. Abd:+BS, soft, nt/nd Ext: no edema, LUE AVF incision c/d/i with faint bruit and thrill, 2+ radial pulse and hand warm  Recent Labs  Lab 03/31/20 0315 03/31/20 0315 03/31/20 1431 03/31/20 1431 04/01/20 0715 04/01/20 0715 04/02/20 0310 04/02/20 0310 04/02/20 1543 04/03/20 0454 04/03/20 1520 04/04/20 0827 04/05/20 0456 04/05/20 2034 04/06/20 0701  NA 138   < > 137   < > 138   < > 139   < > 140 137 136 139 136 137 138  K 3.9   < > 3.9   < > 3.7   < > 3.7   < > 4.3 4.6 4.7 4.0 3.9 5.0 5.1  CL 95*   < > 94*   < > 93*   < > 94*  --  95* 96* 93* 97* 98  --  96*  CO2 34*   < > 30  --  33*  --  31  --   --  30  --  29 26  --  30  GLUCOSE 93   < > 217*   < > 96   < > 91  --  98 87 207* 152* 109*  --  103*  BUN 18   < > 28*   < > 38*   < > 51*  --  15 22 30* 41* 19  --  32*  CREATININE 2.68*   < > 3.77*   < > 4.87*   < > 6.26*  --  2.20* 3.52* 4.00* 5.10* 2.99*  --  4.31*  ALBUMIN 2.5*  --   --   --  2.8*  --  2.9*  --   --  2.8*  --  2.9* 3.0*  --  3.0*  CALCIUM 7.9*   < > 8.0*  --  8.5*  --  8.7*  --   --  8.0*  --  8.0* 8.0*  --  8.2*  PHOS 3.0  --   --   --  4.2  --  4.4  --   --  3.8  --  4.4  3.2  --  5.8*   < > = values in this interval not displayed.   Liver Function Tests: Recent Labs  Lab 04/04/20 0827 04/05/20 0456 04/05/20 1353 04/06/20 0701  PROT  --   --  5.6*  --   ALBUMIN 2.9* 3.0*  --  3.0*   No results for input(s): LIPASE, AMYLASE in the last 168 hours. No results for input(s): AMMONIA in the  last 168 hours. CBC: Recent Labs  Lab 04/02/20 0310 04/02/20 1543 04/03/20 0454 04/03/20 1202 04/05/20 0456 04/05/20 0456 04/05/20 1353 04/05/20 2034 04/06/20 0701  WBC 11.1*  --  7.2  --  9.4  --  9.3  --  7.8  NEUTROABS  --   --   --   --   --   --  8.6*  --   --   HGB 7.4*   < > 6.7*   < > 8.4*   < > 8.6* 7.5* 7.7*  HCT 23.0*   < > 21.1*   < > 27.4*   < > 27.5* 22.0* 25.0*  MCV 88.8  --  92.5  --  91.6  --  92.3  --  93.3  PLT 133*  --  128*  --  161  --  154  --  154   < > = values in this interval not displayed.   Cardiac Enzymes: No results for input(s): CKTOTAL, CKMB, CKMBINDEX, TROPONINI in the last 168 hours. CBG: Recent Labs  Lab 04/05/20 1546 04/05/20 1924 04/05/20 2311 04/06/20 0314 04/06/20 0724  GLUCAP 148* 95 109* 94 94    Iron Studies: No results for input(s): IRON, TIBC, TRANSFERRIN, FERRITIN in the last 72 hours. Studies/Results: CT CHEST WO CONTRAST  Result Date: 04/05/2020 CLINICAL DATA:  Hypoxia EXAM: CT CHEST WITHOUT CONTRAST TECHNIQUE: Multidetector CT imaging of the chest was performed following the standard protocol without IV contrast. COMPARISON:  Chest radiograph April 05, 2020; chest CT March 24, 2020. FINDINGS: Cardiovascular: There is no thoracic aortic aneurysm. There are scattered foci of calcification in visualized great vessels, most notably in the left proximal subclavian artery. There are foci of aortic atherosclerosis. There are foci of coronary artery calcification. A small amount of pericardial fluid is within physiologic range. No pericardial thickening is evident. Central catheter tip is near the cavoatrial  junction. Mediastinum/Nodes: Thyroid is unremarkable in appearance. There are multiple subcentimeter mediastinal lymph nodes. No adenopathy by size criteria evident. No esophageal lesions are appreciable. Lungs/Pleura: There are sizable free-flowing pleural effusions bilaterally. There is compressive atelectatic change in both lung bases. There is extensive airspace opacity throughout the upper lobes, significantly increased from prior CT. There is airspace consolidation in the right lower lobe as well. There is patchy infiltrate in the lingula as well as atelectatic change more inferiorly in the lingula and right middle lobe regions. No pneumothorax. Upper Abdomen: There is upper abdominal aortic atherosclerosis. Visualized upper abdominal structures otherwise appear unremarkable. Musculoskeletal: There are foci of degenerative change in the lumbar spine. There is a large Schmorl's node superiorly at L1. No blastic or lytic bone lesions are evident. There are no chest wall lesions appreciable. IMPRESSION: 1. Sizable free-flowing pleural effusions bilaterally. Compressive atelectasis in both lower lobes. Extensive multifocal airspace opacity, most severe in the upper lobes bilaterally with progression bilaterally compared to most recent CT examination. Areas of patchy atelectasis also seen. 2. There is aortic atherosclerosis as well as foci of coronary artery calcification. Probable hemodynamically significant obstruction due to calcification in the proximal left subclavian artery. 3.  Central catheter tip near cavoatrial junction. 4.  No adenopathy by size criteria. Aortic Atherosclerosis (ICD10-I70.0). Electronically Signed   By: Lowella Grip III M.D.   On: 04/05/2020 13:29   DG Chest Port 1 View  Result Date: 04/06/2020 CLINICAL DATA:  Intubation EXAM: PORTABLE CHEST 1 VIEW COMPARISON:  Yesterday FINDINGS: Endotracheal tube tip is halfway between the clavicular heads and carina.  The enteric tube reaches  the stomach. Dialysis catheter with tip at the upper cavoatrial junction. Mild improvement in airspace disease in the left upper lobe with possible worsening at the base where the diaphragm is less well seen. No visible effusion or pneumothorax. Normal heart size. IMPRESSION: 1. Unremarkable hardware positioning. 2. Infiltrates have mildly improved at the left upper lobe and increased at the left base. Electronically Signed   By: Monte Fantasia M.D.   On: 04/06/2020 08:27   DG CHEST PORT 1 VIEW  Result Date: 04/05/2020 CLINICAL DATA:  Orogastric tube placement. EXAM: PORTABLE CHEST 1 VIEW COMPARISON:  Chest radiograph and CT earlier today. FINDINGS: Enteric tube tip is below the diaphragm not included in the field of view. The side-port is not visualized. Endotracheal tube tip at the level of the clavicular heads, 4.4 cm from the carina. Right-sided dialysis catheter unchanged with tip in the lower SVC. Heart is normal in size. Left pleural effusion and basilar opacity appears similar. Right pleural effusion has improved. Improving perihilar opacities on the right, with slight increase on the left. Persistent pulmonary edema. No pneumothorax. IMPRESSION: 1. Enteric tube tip below the diaphragm not included in the field of view. 2. Endotracheal tube tip 4.4 cm from the carina. 3. Improved right pleural effusion, unchanged left pleural effusion. 4. Shifting perihilar opacities with improvement on the right but worsening on the left, may be due to differences in positioning. Electronically Signed   By: Keith Rake M.D.   On: 04/05/2020 16:44   DG CHEST PORT 1 VIEW  Result Date: 04/05/2020 CLINICAL DATA:  Shortness of breath. EXAM: PORTABLE CHEST 1 VIEW COMPARISON:  03/22/2020 FINDINGS: The right IJ dialysis catheter is in good position, unchanged. The cardiac silhouette, mediastinal and hilar contours are within normal limits. Patchy asymmetric airspace process in the lungs along with bilateral pleural  effusions. No pneumothorax. IMPRESSION: Patchy asymmetric airspace process and bilateral pleural effusions. Electronically Signed   By: Marijo Sanes M.D.   On: 04/05/2020 05:54   DG Abd Portable 1V  Result Date: 04/05/2020 CLINICAL DATA:  Orogastric tube placement. EXAM: PORTABLE ABDOMEN - 1 VIEW COMPARISON:  Chest radiograph of earlier in the day. FINDINGS: nasogastric tube terminates in the descending duodenum versus being looped in the stomach. Left base atelectasis with small left pleural effusion. Non-obstructive bowel gas pattern. Minimal convex right lumbar spine curvature with lumbar spine fixation. IMPRESSION: Nasogastric tube terminating either in the descending duodenum or looped within a redundant stomach. Consider retraction on the order of 10 cm. Electronically Signed   By: Abigail Miyamoto M.D.   On: 04/05/2020 19:01   . sodium chloride   Intravenous Once  . sodium chloride   Intravenous Once  . chlorhexidine gluconate (MEDLINE KIT)  15 mL Mouth Rinse BID  . Chlorhexidine Gluconate Cloth  6 each Topical Q0600  . cholecalciferol  1,000 Units Oral Daily  . cyclophosphamide  50 mg Oral q1800  . darbepoetin (ARANESP) injection - DIALYSIS  100 mcg Intravenous Q Fri-HD  . docusate sodium  200 mg Oral BID  . fentaNYL (SUBLIMAZE) injection  25 mcg Intravenous Once  . heparin injection (subcutaneous)  5,000 Units Subcutaneous Q8H  . insulin aspart  0-24 Units Subcutaneous Q4H  . levothyroxine  88 mcg Oral Q0600  . mouth rinse  15 mL Mouth Rinse 10 times per day  . methylPREDNISolone (SOLU-MEDROL) injection  40 mg Intravenous Q8H  . metoprolol tartrate  100 mg Oral BID  . pantoprazole  40  mg Oral Daily  . polyethylene glycol  17 g Oral BID  . sodium chloride flush  10-40 mL Intracatheter Q12H  . sulfamethoxazole-trimethoprim  1 tablet Oral Q M,W,F    BMET    Component Value Date/Time   NA 138 04/06/2020 0701   NA 128 (L) 03/01/2020 0927   K 5.1 04/06/2020 0701   CL 96 (L)  04/06/2020 0701   CO2 30 04/06/2020 0701   GLUCOSE 103 (H) 04/06/2020 0701   BUN 32 (H) 04/06/2020 0701   BUN 67 (H) 03/01/2020 0927   CREATININE 4.31 (H) 04/06/2020 0701   CREATININE 0.84 12/21/2012 1552   CALCIUM 8.2 (L) 04/06/2020 0701   GFRNONAA 9 (L) 04/06/2020 0701   GFRNONAA 72 12/21/2012 1552   GFRAA 11 (L) 04/06/2020 0701   GFRAA 83 12/21/2012 1552   CBC    Component Value Date/Time   WBC 7.8 04/06/2020 0701   RBC 2.68 (L) 04/06/2020 0701   HGB 7.7 (L) 04/06/2020 0701   HGB 9.7 (L) 03/01/2020 0927   HCT 25.0 (L) 04/06/2020 0701   HCT 28.8 (L) 03/01/2020 0927   PLT 154 04/06/2020 0701   PLT 397 03/01/2020 0927   MCV 93.3 04/06/2020 0701   MCV 79 03/01/2020 0927   MCH 28.7 04/06/2020 0701   MCHC 30.8 04/06/2020 0701   RDW 17.3 (H) 04/06/2020 0701   RDW 14.7 03/01/2020 0927   LYMPHSABS 0.3 (L) 04/05/2020 1353   LYMPHSABS 1.3 03/01/2020 0927   MONOABS 0.3 04/05/2020 1353   EOSABS 0.0 04/05/2020 1353   EOSABS 0.2 03/01/2020 0927   BASOSABS 0.0 04/05/2020 1353   BASOSABS 0.1 03/01/2020 0927    Assessment/Plan:  1. Hypoxia:  Bronch with concern for alveolitis / [place]; also concern for aspiration.  Tol PSV right now 1. Plan for HD and TPE today, TPE again tomorrow 7/17; using FFP 2. 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 Started on daily plasmapheresis and IV solumedrol/po cytoxan. Requiring HD.  1. Biopsy with 100% crescents and significant amount of ATN. 2. [place] continue with HD on MWF schedule for now.  Next HD today 3. For pheresis with FFP due to possible alveolar hemorrhagedaily.  She completed2 weeks of PLEX treatments (11 total over 14d); resent GBM Ab 7/13, still elevated and in light of hypoxia plan is PLEX x 3 more cycles.  #2 today.     4. Continue cytoxan 50 mg daily 5. Developed agitation with high dose pred so it was decreased to 73m daily, cont for now.  6. Bactrim MWF for PCP  prophylaxis 7. Continue to follow for renal recovery, however doubtful given extensive crescents on biopsy.  [facility] GBM Ab on 7/19 3. HTN- BP ^^ in past 24h.  On hydralazine 100 TID, metop 100 BID, amlodipine 10, add clonidine 0.1 TID; has PRN labetalol.  4. Vascular access- RIJ TDC placed 03/20/20 by IR.   VVS placed BC AVF 04/02/20. 5. Thrombocytopenia - resolved, CTM 6. Anemia - Hb 6.7 > 8.1 with transfusion 7/13.  No reported bleeding.  1u pRBC transfused, iron replete. ^ ESA for Friday.  [facility].  Discharge pending repeat antiGBM to determine further PLEX  RR[facility]

## 2020-04-06 NOTE — Progress Notes (Signed)
PT Cancellation Note  Patient Details Name: Jasmine White MRN: [id number] DOB: [birth year]   Cancelled Treatment:    Reason Eval/Treat Not Completed: Medical issues which prohibited therapy (pt intubated and not currently medically appropriate will check back beginning of week)   [name] Myrta Mercer 04/06/2020, 11:44 AM  Bayard Males, PT Acute Rehabilitation Services Pager: [phone] Office: [phone]

## 2020-04-06 NOTE — Progress Notes (Signed)
   04/05/20 0727  Assess: MEWS Score  Temp 98.2 F (36.8 C)  BP (!) 186/82  Pulse Rate (!) 103  ECG Heart Rate (!) 105  Resp (!) 26  Level of Consciousness Alert  SpO2 95 %  O2 Device Nasal Cannula  O2 Flow Rate (L/min) 4 L/min  Assess: MEWS Score  MEWS Temp 0  MEWS Systolic 0  MEWS Pulse 1  MEWS RR 2  MEWS LOC 0  MEWS Score 3  MEWS Score Color Yellow  Assess: if the MEWS score is Yellow or Red  Were vital signs taken at a resting state? Yes  Focused Assessment Documented focused assessment  Early Detection of Sepsis Score *See Row Information* Low  MEWS guidelines implemented *See Row Information* Yes  Treat  MEWS Interventions Other (Comment) (notified MD)  Take Vital Signs  Increase Vital Sign Frequency  Yellow: Q 2hr X 2 then Q 4hr X 2, if remains yellow, continue Q 4hrs  Escalate  MEWS: Escalate Yellow: discuss with charge nurse/RN and consider discussing with provider and RRT  Notify: Charge Nurse/RN  Name of Charge Nurse/RN Notified Ekta, RN  Date Charge Nurse/RN Notified 04/05/20  Time Charge Nurse/RN Notified 0739  Notify: Provider  Provider Name/Title Lajean Manes MD  Date Provider Notified 04/05/20  Time Provider Notified 0730  Notification Type Page  Notification Reason Other (Comment) (update on change in status and concern due to AM rapid )  Date of Provider Response 04/05/20  Time of Provider Response [phone]

## 2020-04-07 ENCOUNTER — Inpatient Hospital Stay (HOSPITAL_COMMUNITY): Payer: Medicare PPO

## 2020-04-07 LAB — CBC
HCT: 27.9 % — ABNORMAL LOW (ref 36.0–46.0)
Hemoglobin: 8.7 g/dL — ABNORMAL LOW (ref 12.0–15.0)
MCH: 29.1 pg (ref 26.0–34.0)
MCHC: 31.2 g/dL (ref 30.0–36.0)
MCV: 93.3 fL (ref 80.0–100.0)
Platelets: 240 10*3/uL (ref 150–400)
RBC: 2.99 MIL/uL — ABNORMAL LOW (ref 3.87–5.11)
RDW: 17.6 % — ABNORMAL HIGH (ref 11.5–15.5)
WBC: 10.8 10*3/uL — ABNORMAL HIGH (ref 4.0–10.5)
nRBC: 0 % (ref 0.0–0.2)

## 2020-04-07 LAB — GLUCOSE, CAPILLARY
Glucose-Capillary: 111 mg/dL — ABNORMAL HIGH (ref 70–99)
Glucose-Capillary: 117 mg/dL — ABNORMAL HIGH (ref 70–99)
Glucose-Capillary: 119 mg/dL — ABNORMAL HIGH (ref 70–99)
Glucose-Capillary: 133 mg/dL — ABNORMAL HIGH (ref 70–99)
Glucose-Capillary: 166 mg/dL — ABNORMAL HIGH (ref 70–99)
Glucose-Capillary: 206 mg/dL — ABNORMAL HIGH (ref 70–99)

## 2020-04-07 LAB — THERAPEUTIC PLASMA EXCHANGE (BLOOD BANK)
Plasma Exchange: 2987
Plasma volume needed: 2976
Unit division: 0
Unit division: 0
Unit division: 0
Unit division: 0
Unit division: 0
Unit division: 0
Unit division: 0
Unit division: 0

## 2020-04-07 LAB — RENAL FUNCTION PANEL
Albumin: 3.1 g/dL — ABNORMAL LOW (ref 3.5–5.0)
Anion gap: 12 (ref 5–15)
BUN: 26 mg/dL — ABNORMAL HIGH (ref 8–23)
CO2: 29 mmol/L (ref 22–32)
Calcium: 8.5 mg/dL — ABNORMAL LOW (ref 8.9–10.3)
Chloride: 95 mmol/L — ABNORMAL LOW (ref 98–111)
Creatinine, Ser: 2.65 mg/dL — ABNORMAL HIGH (ref 0.44–1.00)
GFR calc Af Amer: 20 mL/min — ABNORMAL LOW (ref >60)
GFR calc non Af Amer: 17 mL/min — ABNORMAL LOW (ref >60)
Glucose, Bld: 117 mg/dL — ABNORMAL HIGH (ref 70–99)
Phosphorus: 5.2 mg/dL — ABNORMAL HIGH (ref 2.5–4.6)
Potassium: 4.4 mmol/L (ref 3.5–5.1)
Sodium: 136 mmol/L (ref 135–145)

## 2020-04-07 LAB — CULTURE, RESPIRATORY W GRAM STAIN: Culture: NORMAL

## 2020-04-07 LAB — MAGNESIUM: Magnesium: 2.1 mg/dL (ref 1.7–2.4)

## 2020-04-07 LAB — PROCALCITONIN: Procalcitonin: 0.63 ng/mL

## 2020-04-07 MED ORDER — CALCIUM GLUCONATE 10 % IV SOLN
2.0000 g | Freq: Once | INTRAVENOUS | Status: DC
  Filled 2020-04-07: qty 20

## 2020-04-07 MED ORDER — LEVOTHYROXINE SODIUM 88 MCG PO TABS
88.0000 ug | ORAL_TABLET | Freq: Every day | ORAL | Status: DC
  Administered 2020-04-08: 88 ug
  Filled 2020-04-07: qty 1

## 2020-04-07 MED ORDER — ANTICOAGULANT SODIUM CITRATE 4% (200MG/5ML) IV SOLN
5.0000 mL | Freq: Once | Status: DC
  Filled 2020-04-07: qty 5

## 2020-04-07 MED ORDER — ANTICOAGULANT SODIUM CITRATE 4% (200MG/5ML) IV SOLN: 5.0000 mL | Freq: Once | Status: DC

## 2020-04-07 MED ORDER — METOPROLOL TARTRATE 12.5 MG HALF TABLET
100.0000 mg | ORAL_TABLET | Freq: Two times a day (BID) | ORAL | Status: DC
  Administered 2020-04-07: 100 mg
  Filled 2020-04-07: qty 8

## 2020-04-07 MED ORDER — ACETAMINOPHEN 325 MG PO TABS
650.0000 mg | ORAL_TABLET | ORAL | Status: DC | PRN
Start: 2020-04-07 — End: 2020-04-07

## 2020-04-07 MED ORDER — CALCIUM GLUCONATE-NACL 2-0.675 GM/100ML-% IV SOLN
2.0000 g | Freq: Once | INTRAVENOUS | Status: DC
Start: 2020-04-07 — End: 2020-04-07

## 2020-04-07 MED ORDER — DOCUSATE SODIUM 50 MG/5ML PO LIQD
200.0000 mg | Freq: Two times a day (BID) | ORAL | Status: DC
  Administered 2020-04-07: 200 mg
  Filled 2020-04-07 (×2): qty 20

## 2020-04-07 MED ORDER — LABETALOL HCL 5 MG/ML IV SOLN
10.0000 mg | INTRAVENOUS | Status: DC | PRN
  Administered 2020-04-07 (×2): 10 mg via INTRAVENOUS
  Filled 2020-04-07: qty 4

## 2020-04-07 MED ORDER — DIPHENHYDRAMINE HCL 25 MG PO CAPS: 25.0000 mg | ORAL_CAPSULE | Freq: Four times a day (QID) | ORAL | Status: DC | PRN

## 2020-04-07 MED ORDER — ACETAMINOPHEN 325 MG PO TABS: 650.0000 mg | ORAL_TABLET | ORAL | Status: DC | PRN

## 2020-04-07 MED ORDER — CALCIUM GLUCONATE 10 % IV SOLN: 2.0000 g | Freq: Once | INTRAVENOUS | Status: DC

## 2020-04-07 MED ORDER — ACD FORMULA A 0.73-2.45-2.2 GM/100ML VI SOLN: 1000.0000 mL | Status: DC

## 2020-04-07 MED ORDER — CALCIUM GLUCONATE-NACL 2-0.675 GM/100ML-% IV SOLN
2.0000 g | Freq: Once | INTRAVENOUS | Status: AC
  Administered 2020-04-07: 2000 mg via INTRAVENOUS
  Filled 2020-04-07: qty 100

## 2020-04-07 NOTE — Progress Notes (Signed)
NAME:  Jasmine White, MRN:  [id number], DOB:  February 23, 1946, LOS: 21 ADMISSION DATE:  03/17/2020, CONSULTATION DATE:  04/05/20 REFERRING MD:  Mal Misty, CHIEF COMPLAINT:  Dyspnea   Brief History   74 yo female presented to ER with nausea and vomiting in setting of hypovolemic hyponatremia, UTI and new onset renal failure.  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  Started on steroids, cytoxan, plasmapheresis.  Developed progressive dyspnea and hypoxia with b/l GGO on CT chest.  Developed progressive hypoxia and required intubation.  Past Medical History  Chronic back pain, HTN, Hypothyroidism, CKD, HLD, COVID 11 August 2019  [facility] Events   6/27 admitted 6/29 Rt IJ HD catheter by IR, start solumedrol 6/30 start plasmapheresis 7/01 Lt renal biopsy >> nearly 100% crescents with significant ATN 7/02 change to prednisone, start cytoxan 7/03 short of breath 7/12 change prednisone to 40 mg daily; Lt brachiocephalic AV fistula created 7/15 worsening hypoxia, VDRF, change back to solumedrol  Consults:  Nephrology IR Vascular surgery  Procedures:  6/29 IR tunneled HD cath 7/15 ETT >  Significant Diagnostic Tests:   ANCA 6/27 >> MPO 10.3 (nl 0 - 9)  GBM Ab 6/27 >> 137 (nl 0 - 20)  ANA 6/27 >> positive, ds DNA Ab 50 (nl < 5)  CT abd/pelvis 03/17/20 >> inspissated fecal material, gallstones  V/Q scan 7/01 >> negative  CT chest 7/03 >> mosaic areas of GGO and consolidation, mod b/l effusions  GMB Ab 7/13 >> 55  CT chest 7/15 >> large b/l effusions with compressive ATX, extensive ASD  Rt thoracentesis 7/15 >> 900 cc straw colored fluid, glucose 133, protein < 3, LDH 50, WBC 63 (72%M)  Bronchoscopy 7/15 >> WBC 125 (68%N)  Echo 7/16 >>  EF 55 to 60%, grade 1 DD, large Lt effusion, mod MR, mod/severe AR  Micro Data:  COVID 6/26 >> negative MRSA PCR 6/26 >> negative PCP BAL 7/15 >>  AFB BAL 7/15 >>  Fungal  BAL 7/15 >> BAL 7/15 >> Blood 7/15 >>  Rt pleural fluid 7/15 >>   Antimicrobials:  Bactrim ppx >>  Interim history/subjective:  Remains on vent, getting plasmapheresis.  Objective   Blood pressure (!) 157/77, pulse 74, temperature 98.1 F (36.7 C), temperature source Oral, resp. rate 14, height _0  (1.676 m), weight 61 kg, SpO2 100 %.    Vent Mode: PRVC FiO2 (%):  [40 %] 40 % Set Rate:  [14 bmp] 14 bmp Vt Set:  [450 mL] 450 mL PEEP:  [5 cmH20] 5 cmH20 Pressure Support:  [5 cmH20] 5 cmH20 Plateau Pressure:  [18 cmH20-21 cmH20] 20 cmH20   Intake/Output Summary (Last 24 hours) at 04/07/2020 1015 Last data filed at 04/07/2020 0900 Gross per 24 hour  Intake 861.7 ml  Output 3000 ml  Net -2138.3 ml   Filed Weights   04/06/20 1336 04/06/20 1654 04/07/20 0500  Weight: 64.3 kg 60.2 kg 61 kg    Examination:  General - alert Eyes - pupils reactive ENT - ETT in place Cardiac - regular rate/rhythm, no murmur Chest - b/l crackles Abdomen - soft, non tender, + bowel sounds Extremities - no cyanosis, clubbing, or edema Skin - no rashes Neuro - RASS 0   Resolved Hospital Problem list   UTI  Assessment & Plan:   Acute hypoxic respiratory failure from b/l pulmonary infiltrates in setting of GBM/ANCA positive vasculitis and transudate pleural effusions. - continue full vent support - f/u CXR -  goal SpO2 > 92% - continue plasmapheresis per nephrology >> complete on 7/17 - f/u BAL from 7/15  AKI from RPGN 2nd to Anti GBM disease with positive MPO, dsDNA. - steroids, cytoxan per nephrology - bactrim prophylaxis  - iHD M/W/F - f/u GBM Ab 7/18  Hx of HTN. - goal SBP < 150 - continue lopressor  Hx of hypothyroidism. - continue synthroid  Steroid induced hyperglycemia. - SSI  Anemia of critical illness. - f/u CBC - transfuse for Hb < 7 or significant bleeding  Best practice:  Diet: tube feeds DVT prophylaxis: heparin GI prophylaxis: protonix Mobility: bed  rest Code Status: Full Disposition: ICU   Labs    CMP Latest Ref Rng & Units 04/07/2020 04/06/2020 04/05/2020  Glucose 70 - 99 mg/dL 117(H) 103(H) -  BUN 8 - 23 mg/dL 26(H) 32(H) -  Creatinine 0.44 - 1.00 mg/dL 2.65(H) 4.31(H) -  Sodium 135 - 145 mmol/L 136 138 137  Potassium 3.5 - 5.1 mmol/L 4.4 5.1 5.0  Chloride 98 - 111 mmol/L 95(L) 96(L) -  CO2 22 - 32 mmol/L 29 30 -  Calcium 8.9 - 10.3 mg/dL 8.5(L) 8.2(L) -  Total Protein 6.5 - 8.1 g/dL - - -  Total Bilirubin 0.3 - 1.2 mg/dL - - -  Alkaline Phos 38 - 126 U/L - - -  AST 15 - 41 U/L - - -  ALT 0 - 44 U/L - - -    CBC Latest Ref Rng & Units 04/07/2020 04/06/2020 04/05/2020  WBC 4.0 - 10.5 K/uL 10.8(H) 7.8 -  Hemoglobin 12.0 - 15.0 g/dL 8.7(L) 7.7(L) 7.5(L)  Hematocrit 36 - 46 % 27.9(L) 25.0(L) 22.0(L)  Platelets 150 - 400 K/uL 240 154 -    ABG    Component Value Date/Time   PHART 7.551 (H) 04/05/2020 2034   PCO2ART 40.7 04/05/2020 2034   PO2ART 167 (H) 04/05/2020 2034   HCO3 35.8 (H) 04/05/2020 2034   TCO2 37 (H) 04/05/2020 2034   O2SAT 100.0 04/05/2020 2034    CBG (last 3)  Recent Labs    04/06/20 2313 04/07/20 0312 04/07/20 0711  GLUCAP 128* 117* 119*    Critical care time: 43 minutes  Chesley Mires, MD [place] Pager - [phone] - [phone] 04/07/2020, 11:05 AM

## 2020-04-07 NOTE — Progress Notes (Signed)
Patient ID: Jasmine White, female   DOB: September 21, 1946, 74 y.o.   MRN: [id number] S:   TPE and HD yesterday  For TPE again today with FFP  Awake, alert  3L UF with HD yesterday  O:BP (!) 157/77   Pulse 74   Temp 98.1 F (36.7 C) (Oral)   Resp 14   Ht 5' 6" (1.676 m)   Wt 61 kg   SpO2 100%   BMI 21.71 kg/m   Intake/Output Summary (Last 24 hours) at 04/07/2020 1016 Last data filed at 04/07/2020 0900 Gross per 24 hour  Intake 861.7 ml  Output 3000 ml  Net -2138.3 ml   Intake/Output: I/O last 3 completed shifts: In: 942.6 [I.V.:335.4; NG/GT:600; IV Piggyback:7.1] Out: 3000 [Other:3000]  Intake/Output this shift:  Total I/O In: 85 [I.V.:5; NG/GT:80] Out: -  Weight change: 0 kg Gen: intubated, awake and alert and interactive R IJ TDC C/D/I CVS: no rub, regular nl s1s2 Resp: coarse b/l Abd:+BS, soft, nt/nd Ext: no edema, LUE AVF incision c/d/i with faint bruit and thrill,  Recent Labs  Lab 04/01/20 0715 04/01/20 0715 04/02/20 0310 04/02/20 0310 04/02/20 1543 04/02/20 1543 04/03/20 0454 04/03/20 1520 04/04/20 0827 04/05/20 0456 04/05/20 2034 04/06/20 0701 04/07/20 0240  NA 138   < > 139   < > 140   < > 137 136 139 136 137 138 136  K 3.7   < > 3.7   < > 4.3   < > 4.6 4.7 4.0 3.9 5.0 5.1 4.4  CL 93*   < > 94*   < > 95*  --  96* 93* 97* 98  --  96* 95*  CO2 33*  --  31  --   --   --  30  --  29 26  --  30 29  GLUCOSE 96   < > 91   < > 98  --  87 207* 152* 109*  --  103* 117*  BUN 38*   < > 51*   < > 15  --  22 30* 41* 19  --  32* 26*  CREATININE 4.87*   < > 6.26*   < > 2.20*  --  3.52* 4.00* 5.10* 2.99*  --  4.31* 2.65*  ALBUMIN 2.8*  --  2.9*  --   --   --  2.8*  --  2.9* 3.0*  --  3.0* 3.1*  CALCIUM 8.5*  --  8.7*  --   --   --  8.0*  --  8.0* 8.0*  --  8.2* 8.5*  PHOS 4.2  --  4.4  --   --   --  3.8  --  4.4 3.2  --  5.8* 5.2*   < > = values in this interval not displayed.   Liver Function Tests: Recent Labs  Lab 04/05/20 0456 04/05/20 1353  04/06/20 0701 04/07/20 0240  PROT  --  5.6*  --   --   ALBUMIN 3.0*  --  3.0* 3.1*   No results for input(s): LIPASE, AMYLASE in the last 168 hours. No results for input(s): AMMONIA in the last 168 hours. CBC: Recent Labs  Lab 04/03/20 0454 04/03/20 1202 04/05/20 0456 04/05/20 0456 04/05/20 1353 04/05/20 1353 04/05/20 2034 04/06/20 0701 04/07/20 0240  WBC 7.2  --  9.4   < > 9.3  --   --  7.8 10.8*  NEUTROABS  --   --   --   --  8.6*  --   --   --   --  HGB 6.7*   < > 8.4*   < > 8.6*   < > 7.5* 7.7* 8.7*  HCT 21.1*   < > 27.4*   < > 27.5*   < > 22.0* 25.0* 27.9*  MCV 92.5  --  91.6  --  92.3  --   --  93.3 93.3  PLT 128*  --  161   < > 154  --   --  154 240   < > = values in this interval not displayed.   Cardiac Enzymes: No results for input(s): CKTOTAL, CKMB, CKMBINDEX, TROPONINI in the last 168 hours. CBG: Recent Labs  Lab 04/06/20 1546 04/06/20 1925 04/06/20 2313 04/07/20 0312 04/07/20 0711  GLUCAP 84 133* 128* 117* 119*    Iron Studies: No results for input(s): IRON, TIBC, TRANSFERRIN, FERRITIN in the last 72 hours. Studies/Results: CT CHEST WO CONTRAST  Result Date: 04/05/2020 CLINICAL DATA:  Hypoxia EXAM: CT CHEST WITHOUT CONTRAST TECHNIQUE: Multidetector CT imaging of the chest was performed following the standard protocol without IV contrast. COMPARISON:  Chest radiograph April 05, 2020; chest CT March 24, 2020. FINDINGS: Cardiovascular: There is no thoracic aortic aneurysm. There are scattered foci of calcification in visualized great vessels, most notably in the left proximal subclavian artery. There are foci of aortic atherosclerosis. There are foci of coronary artery calcification. A small amount of pericardial fluid is within physiologic range. No pericardial thickening is evident. Central catheter tip is near the cavoatrial junction. Mediastinum/Nodes: Thyroid is unremarkable in appearance. There are multiple subcentimeter mediastinal lymph nodes. No  adenopathy by size criteria evident. No esophageal lesions are appreciable. Lungs/Pleura: There are sizable free-flowing pleural effusions bilaterally. There is compressive atelectatic change in both lung bases. There is extensive airspace opacity throughout the upper lobes, significantly increased from prior CT. There is airspace consolidation in the right lower lobe as well. There is patchy infiltrate in the lingula as well as atelectatic change more inferiorly in the lingula and right middle lobe regions. No pneumothorax. Upper Abdomen: There is upper abdominal aortic atherosclerosis. Visualized upper abdominal structures otherwise appear unremarkable. Musculoskeletal: There are foci of degenerative change in the lumbar spine. There is a large Schmorl's node superiorly at L1. No blastic or lytic bone lesions are evident. There are no chest wall lesions appreciable. IMPRESSION: 1. Sizable free-flowing pleural effusions bilaterally. Compressive atelectasis in both lower lobes. Extensive multifocal airspace opacity, most severe in the upper lobes bilaterally with progression bilaterally compared to most recent CT examination. Areas of patchy atelectasis also seen. 2. There is aortic atherosclerosis as well as foci of coronary artery calcification. Probable hemodynamically significant obstruction due to calcification in the proximal left subclavian artery. 3.  Central catheter tip near cavoatrial junction. 4.  No adenopathy by size criteria. Aortic Atherosclerosis (ICD10-I70.0). Electronically Signed   By: Lowella Grip III M.D.   On: 04/05/2020 13:29   DG CHEST PORT 1 VIEW  Result Date: 04/07/2020 CLINICAL DATA:  74 year old female with respiratory failure EXAM: PORTABLE CHEST 1 VIEW COMPARISON:  04/06/2020 FINDINGS: None cardiomediastinal silhouette unchanged in size and contour. Endotracheal tube is unchanged terminating approximately 3.4 cm above the carina. Unchanged right IJ tunneled hemodialysis  catheter. Unchanged gastric tube terminating out of the field of view. Similar appearance of the lungs with coarsened interstitial markings and opacity at the left lung base obscuring the left hemidiaphragm. No pneumothorax. IMPRESSION: Similar appearance of the chest x-ray with mild bilateral interstitial opacities and left basilar opacity which may reflect a combination  of atelectasis/consolidation and/or pleural fluid. Unchanged endotracheal tube, gastric tube, right IJ hemodialysis catheter. Electronically Signed   By: Corrie Mckusick D.O.   On: 04/07/2020 09:00   DG Chest Port 1 View  Result Date: 04/06/2020 CLINICAL DATA:  Intubation EXAM: PORTABLE CHEST 1 VIEW COMPARISON:  Yesterday FINDINGS: Endotracheal tube tip is halfway between the clavicular heads and carina. The enteric tube reaches the stomach. Dialysis catheter with tip at the upper cavoatrial junction. Mild improvement in airspace disease in the left upper lobe with possible worsening at the base where the diaphragm is less well seen. No visible effusion or pneumothorax. Normal heart size. IMPRESSION: 1. Unremarkable hardware positioning. 2. Infiltrates have mildly improved at the left upper lobe and increased at the left base. Electronically Signed   By: Monte Fantasia M.D.   On: 04/06/2020 08:27   DG CHEST PORT 1 VIEW  Result Date: 04/05/2020 CLINICAL DATA:  Orogastric tube placement. EXAM: PORTABLE CHEST 1 VIEW COMPARISON:  Chest radiograph and CT earlier today. FINDINGS: Enteric tube tip is below the diaphragm not included in the field of view. The side-port is not visualized. Endotracheal tube tip at the level of the clavicular heads, 4.4 cm from the carina. Right-sided dialysis catheter unchanged with tip in the lower SVC. Heart is normal in size. Left pleural effusion and basilar opacity appears similar. Right pleural effusion has improved. Improving perihilar opacities on the right, with slight increase on the left. Persistent  pulmonary edema. No pneumothorax. IMPRESSION: 1. Enteric tube tip below the diaphragm not included in the field of view. 2. Endotracheal tube tip 4.4 cm from the carina. 3. Improved right pleural effusion, unchanged left pleural effusion. 4. Shifting perihilar opacities with improvement on the right but worsening on the left, may be due to differences in positioning. Electronically Signed   By: Keith Rake M.D.   On: 04/05/2020 16:44   DG Abd Portable 1V  Result Date: 04/05/2020 CLINICAL DATA:  Orogastric tube placement. EXAM: PORTABLE ABDOMEN - 1 VIEW COMPARISON:  Chest radiograph of earlier in the day. FINDINGS: nasogastric tube terminates in the descending duodenum versus being looped in the stomach. Left base atelectasis with small left pleural effusion. Non-obstructive bowel gas pattern. Minimal convex right lumbar spine curvature with lumbar spine fixation. IMPRESSION: Nasogastric tube terminating either in the descending duodenum or looped within a redundant stomach. Consider retraction on the order of 10 cm. Electronically Signed   By: Abigail Miyamoto M.D.   On: 04/05/2020 19:01   ECHOCARDIOGRAM COMPLETE  Result Date: 04/06/2020    ECHOCARDIOGRAM REPORT   Patient Name:   CHIOMA MUKHERJEE Date of Exam: 04/06/2020 Medical Rec #:  [id number]       Height:       66.0 in Accession #:    [id number]      Weight:       143.3 lb Date of Birth:  1946/06/25       BSA:          1.736 m Patient Age:    54 years        BP:           145/73 mmHg Patient Gender: F               HR:           90 bpm. Exam Location:  Inpatient Procedure: 2D Echo, Cardiac Doppler and Color Doppler Indications:    I50.23 Acute on chronic systolic (congestive) heart failure  History:  Patient has no prior history of Echocardiogram examinations.                 Risk Factors:Hypertension and Dyslipidemia. Cancer.                 Hypothyroidism.  Sonographer:    Jonelle Sidle Dance Referring Phys: [id number] Candee Furbish  Sonographer  Comments: Echo performed with patient supine and on artificial respirator. IMPRESSIONS  1. Left ventricular ejection fraction, by estimation, is 55 to 60%. The left ventricle has normal function. The left ventricle has no regional wall motion abnormalities. Left ventricular diastolic parameters are consistent with Grade I diastolic dysfunction (impaired relaxation).  2. Right ventricular systolic function is normal. The right ventricular size is normal.  3. Left atrial size was mildly dilated.  4. Trivial pericardial effusion. Large pleural effusion in the left lateral region.  5. The mitral valve is normal in structure. Moderate mitral valve regurgitation. No evidence of mitral stenosis.  6. Aortic regurgitation - vena contracta 0.58 cm. The aortic valve is tricuspid. Aortic valve regurgitation is moderate to severe. Mild aortic valve sclerosis is present, with no evidence of aortic valve stenosis.  7. The inferior vena cava is normal in size with greater than 50% respiratory variability, suggesting right atrial pressure of 3 mmHg. FINDINGS  Left Ventricle: Left ventricular ejection fraction, by estimation, is 55 to 60%. The left ventricle has normal function. The left ventricle has no regional wall motion abnormalities. The left ventricular internal cavity size was normal in size. There is  no left ventricular hypertrophy. Left ventricular diastolic parameters are consistent with Grade I diastolic dysfunction (impaired relaxation). Right Ventricle: The right ventricular size is normal. No increase in right ventricular wall thickness. Right ventricular systolic function is normal. Left Atrium: Left atrial size was mildly dilated. Right Atrium: Right atrial size was normal in size. Pericardium: Trivial pericardial effusion. Trivial pericardial effusion is present. Mitral Valve: The mitral valve is normal in structure. Normal mobility of the mitral valve leaflets. Mild mitral annular calcification. Moderate mitral  valve regurgitation. No evidence of mitral valve stenosis. Tricuspid Valve: The tricuspid valve is normal in structure. Tricuspid valve regurgitation is mild . No evidence of tricuspid stenosis. Aortic Valve: Aortic regurgitation - vena contracta 0.58 cm. The aortic valve is tricuspid. Aortic valve regurgitation is moderate to severe. Aortic regurgitation PHT measures 185 msec. Mild aortic valve sclerosis is present, with no evidence of aortic valve stenosis. Pulmonic Valve: The pulmonic valve was normal in structure. Pulmonic valve regurgitation is not visualized. No evidence of pulmonic stenosis. Aorta: The aortic root is normal in size and structure. Venous: The inferior vena cava is normal in size with greater than 50% respiratory variability, suggesting right atrial pressure of 3 mmHg. IAS/Shunts: No atrial level shunt detected by color flow Doppler. Additional Comments: There is a large pleural effusion in the left lateral region.  LEFT VENTRICLE PLAX 2D LVIDd:         4.90 cm  Diastology LVIDs:         3.30 cm  LV e' lateral:   5.77 cm/s LV PW:         0.80 cm  LV E/e' lateral: 22.7 LV IVS:        0.80 cm  LV e' medial:    5.55 cm/s LVOT diam:     2.00 cm  LV E/e' medial:  23.6 LV SV:         76 LV SV Index:   44  LVOT Area:     3.14 cm  RIGHT VENTRICLE             IVC RV Basal diam:  3.30 cm     IVC diam: 1.90 cm RV Mid diam:    2.70 cm RV S prime:     14.80 cm/s TAPSE (M-mode): 2.8 cm LEFT ATRIUM             Index       RIGHT ATRIUM           Index LA diam:        3.20 cm 1.84 cm/m  RA Area:     13.90 cm LA Vol (A2C):   79.5 ml 45.81 ml/m RA Volume:   32.70 ml  18.84 ml/m LA Vol (A4C):   32.7 ml 18.84 ml/m LA Biplane Vol: 52.6 ml 30.31 ml/m  AORTIC VALVE LVOT Vmax:   95.60 cm/s LVOT Vmean:  73.900 cm/s LVOT VTI:    0.241 m AI PHT:      185 msec  AORTA Ao Root diam: 3.40 cm Ao Asc diam:  3.70 cm MITRAL VALVE MV Area (PHT): 4.60 cm      SHUNTS MV Decel Time: 165 msec      Systemic VTI:  0.24 m MR Peak  grad:    136.4 mmHg  Systemic Diam: 2.00 cm MR Mean grad:    92.0 mmHg MR Vmax:         584.00 cm/s MR Vmean:        457.0 cm/s MR PISA:         1.57 cm MR PISA Eff ROA: 10 mm MR PISA Radius:  0.50 cm MV E velocity: 131.00 cm/s MV A velocity: 122.00 cm/s MV E/A ratio:  1.07 Candee Furbish MD Electronically signed by Candee Furbish MD Signature Date/Time: 04/06/2020/11:38:12 AM    Final    . sodium chloride   Intravenous Once  . sodium chloride   Intravenous Once  . calcium gluconate  2 g Intravenous Once  . chlorhexidine gluconate (MEDLINE KIT)  15 mL Mouth Rinse BID  . Chlorhexidine Gluconate Cloth  6 each Topical Q0600  . cholecalciferol  1,000 Units Oral Daily  . cyclophosphamide  50 mg Oral q1800  . darbepoetin (ARANESP) injection - DIALYSIS  100 mcg Intravenous Q Fri-HD  . docusate  200 mg Per Tube BID  . feeding supplement (PROSource TF)  45 mL Per Tube TID  . feeding supplement (VITAL AF 1.2 CAL)  1,000 mL Per Tube Q24H  . fentaNYL (SUBLIMAZE) injection  25 mcg Intravenous Once  . heparin injection (subcutaneous)  5,000 Units Subcutaneous Q8H  . insulin aspart  0-24 Units Subcutaneous Q4H  . levothyroxine  88 mcg Oral Q0600  . mouth rinse  15 mL Mouth Rinse 10 times per day  . methylPREDNISolone (SOLU-MEDROL) injection  40 mg Intravenous Q8H  . metoprolol tartrate  100 mg Oral BID  . pantoprazole  40 mg Oral Daily  . polyethylene glycol  17 g Oral BID  . sodium chloride flush  10-40 mL Intracatheter Q12H  . sulfamethoxazole-trimethoprim  1 tablet Oral Q M,W,F    BMET    Component Value Date/Time   NA 136 04/07/2020 0240   NA 128 (L) 03/01/2020 0927   K 4.4 04/07/2020 0240   CL 95 (L) 04/07/2020 0240   CO2 29 04/07/2020 0240   GLUCOSE 117 (H) 04/07/2020 0240   BUN 26 (H) 04/07/2020 0240   BUN 67 (H)  03/01/2020 0927   CREATININE 2.65 (H) 04/07/2020 0240   CREATININE 0.84 12/21/2012 1552   CALCIUM 8.5 (L) 04/07/2020 0240   GFRNONAA 17 (L) 04/07/2020 0240   GFRNONAA 72  12/21/2012 1552   GFRAA 20 (L) 04/07/2020 0240   GFRAA 83 12/21/2012 1552   CBC    Component Value Date/Time   WBC 10.8 (H) 04/07/2020 0240   RBC 2.99 (L) 04/07/2020 0240   HGB 8.7 (L) 04/07/2020 0240   HGB 9.7 (L) 03/01/2020 0927   HCT 27.9 (L) 04/07/2020 0240   HCT 28.8 (L) 03/01/2020 0927   PLT 240 04/07/2020 0240   PLT 397 03/01/2020 0927   MCV 93.3 04/07/2020 0240   MCV 79 03/01/2020 0927   MCH 29.1 04/07/2020 0240   MCHC 31.2 04/07/2020 0240   RDW 17.6 (H) 04/07/2020 0240   RDW 14.7 03/01/2020 0927   LYMPHSABS 0.3 (L) 04/05/2020 1353   LYMPHSABS 1.3 03/01/2020 0927   MONOABS 0.3 04/05/2020 1353   EOSABS 0.0 04/05/2020 1353   EOSABS 0.2 03/01/2020 0927   BASOSABS 0.0 04/05/2020 1353   BASOSABS 0.1 03/01/2020 0927    Assessment/Plan:  1. Hypoxia:  [place] with concern for alveolitis / [place]; also concern for aspiration.   1. Plan  Another TPE today 2. Check Anti GBM titer tomorrow 2. Dialysis dependent oliguric,AKIdue tp RPGN from anti-GBM disease as well as +ANCA, ANA, dsDNA and MPO. Started on daily plasmapheresis and IV solumedrol/po cytoxan.  1. Biopsy with 100% crescents and significant amount of ATN. 2. [place] continue with HD on MWF schedule for now.  Next HD 7/19 3. For pheresis with FFP due to possible alveolar hemorrhagedaily.  She completed2 weeks of PLEX treatments (11 total over 14d); resent GBM Ab 7/13, still elevated and in light of hypoxia plan is PLEX x 3 more cycles, concluding today     4. Continue cytoxan 50 mg daily 5. Developed agitation with high dose pred so it was decreased to 66m daily, cont for now.  6. Bactrim MWF for PCP prophylaxis 7. Continue to follow for renal recovery, however doubtful given extensive crescents on biopsy.  [facility] Plan to rpt GBM Ab on 7/18 3. HTN- BP stable.  On MTP only 4. Vascular access- RIJ TDC placed 03/20/20 by IR.   VVS placed BC AVF 04/02/20. 5. Thrombocytopenia - resolved,  CTM 6. Anemia - Hb 8.7 today, stable  darbe 100 qFriday  7. [facility] ready for [place]

## 2020-04-08 ENCOUNTER — Inpatient Hospital Stay (HOSPITAL_COMMUNITY): Payer: Medicare PPO

## 2020-04-08 LAB — THERAPEUTIC PLASMA EXCHANGE (BLOOD BANK)
Plasma volume needed: 2976
Unit division: 0
Unit division: 0
Unit division: 0
Unit division: 0
Unit division: 0
Unit division: 0
Unit division: 0

## 2020-04-08 LAB — MAGNESIUM: Magnesium: 2.1 mg/dL (ref 1.7–2.4)

## 2020-04-08 LAB — RENAL FUNCTION PANEL
Albumin: 3 g/dL — ABNORMAL LOW (ref 3.5–5.0)
Anion gap: 15 (ref 5–15)
BUN: 59 mg/dL — ABNORMAL HIGH (ref 8–23)
CO2: 30 mmol/L (ref 22–32)
Calcium: 8.5 mg/dL — ABNORMAL LOW (ref 8.9–10.3)
Chloride: 92 mmol/L — ABNORMAL LOW (ref 98–111)
Creatinine, Ser: 3.99 mg/dL — ABNORMAL HIGH (ref 0.44–1.00)
GFR calc Af Amer: 12 mL/min — ABNORMAL LOW (ref >60)
GFR calc non Af Amer: 10 mL/min — ABNORMAL LOW (ref >60)
Glucose, Bld: 154 mg/dL — ABNORMAL HIGH (ref 70–99)
Phosphorus: 5.6 mg/dL — ABNORMAL HIGH (ref 2.5–4.6)
Potassium: 4.6 mmol/L (ref 3.5–5.1)
Sodium: 137 mmol/L (ref 135–145)

## 2020-04-08 LAB — CBC
HCT: 26.2 % — ABNORMAL LOW (ref 36.0–46.0)
Hemoglobin: 8.4 g/dL — ABNORMAL LOW (ref 12.0–15.0)
MCH: 30.1 pg (ref 26.0–34.0)
MCHC: 32.1 g/dL (ref 30.0–36.0)
MCV: 93.9 fL (ref 80.0–100.0)
Platelets: 175 10*3/uL (ref 150–400)
RBC: 2.79 MIL/uL — ABNORMAL LOW (ref 3.87–5.11)
RDW: 17.7 % — ABNORMAL HIGH (ref 11.5–15.5)
WBC: 8.2 10*3/uL (ref 4.0–10.5)
nRBC: 0 % (ref 0.0–0.2)

## 2020-04-08 LAB — GLUCOSE, CAPILLARY
Glucose-Capillary: 101 mg/dL — ABNORMAL HIGH (ref 70–99)
Glucose-Capillary: 114 mg/dL — ABNORMAL HIGH (ref 70–99)
Glucose-Capillary: 152 mg/dL — ABNORMAL HIGH (ref 70–99)
Glucose-Capillary: 152 mg/dL — ABNORMAL HIGH (ref 70–99)
Glucose-Capillary: 176 mg/dL — ABNORMAL HIGH (ref 70–99)
Glucose-Capillary: 96 mg/dL (ref 70–99)

## 2020-04-08 LAB — PHOSPHORUS: Phosphorus: 5.7 mg/dL — ABNORMAL HIGH (ref 2.5–4.6)

## 2020-04-08 MED ORDER — METOPROLOL TARTRATE 100 MG PO TABS
100.0000 mg | ORAL_TABLET | Freq: Two times a day (BID) | ORAL | Status: DC
  Administered 2020-04-08 – 2020-04-10 (×3): 100 mg via ORAL
  Filled 2020-04-08: qty 8
  Filled 2020-04-08 (×2): qty 1

## 2020-04-08 MED ORDER — LEVOTHYROXINE SODIUM 88 MCG PO TABS
88.0000 ug | ORAL_TABLET | Freq: Every day | ORAL | Status: DC
  Administered 2020-04-09 – 2020-04-10 (×2): 88 ug via ORAL
  Filled 2020-04-08 (×2): qty 1

## 2020-04-08 MED ORDER — ACETAMINOPHEN 325 MG PO TABS: 650.0000 mg | ORAL_TABLET | ORAL | Status: DC | PRN

## 2020-04-08 NOTE — Progress Notes (Signed)
Patient ID: Jasmine White, female   DOB: 1946/03/08, 74 y.o.   MRN: [id number] S:   Final TPE (3rd extended session) yesterday with FFP  Anti GBM Ab titer pending this AM  Likely to extubate today  Awake, alert  O:BP (!) 157/77   Pulse (!) 101   Temp 98.3 F (36.8 C) (Axillary)   Resp 16   Ht _0  (1.676 m)   Wt 58.7 kg   SpO2 100%   BMI 20.89 kg/m   Intake/Output Summary (Last 24 hours) at 04/08/2020 1036 Last data filed at 04/08/2020 0800 Gross per 24 hour  Intake 1114.4 ml  Output --  Net 1114.4 ml   Intake/Output: I/O last 3 completed shifts: In: 2013.3 [I.V.:188.3; NG/GT:1825] Out: -   Intake/Output this shift:  Total I/O In: 1.9 [I.V.:1.9] Out: -  Weight change: -5.6 kg Gen: intubated, awake and alert and interactive R IJ TDC C/D/I CVS: no rub, regular nl s1s2 Resp: coarse b/l Abd:+BS, soft, nt/nd Ext: no edema, LUE AVF incision c/d/i with faint bruit and thrill,  Recent Labs  Lab 04/02/20 0310 04/02/20 1543 04/03/20 0454 04/03/20 0454 04/03/20 1520 04/04/20 0827 04/05/20 0456 04/05/20 2034 04/06/20 0701 04/07/20 0240 04/08/20 0359  NA 139   < > 137   < > 136 139 136 137 138 136 137  K 3.7   < > 4.6   < > 4.7 4.0 3.9 5.0 5.1 4.4 4.6  CL 94*   < > 96*  --  93* 97* 98  --  96* 95* 92*  CO2 31  --  30  --   --  29 26  --  _1 GLUCOSE 91   < > 87  --  207* 152* 109*  --  103* 117* 154*  BUN 51*   < > 22  --  30* 41* 19  --  32* 26* 59*  CREATININE 6.26*   < > 3.52*  --  4.00* 5.10* 2.99*  --  4.31* 2.65* 3.99*  ALBUMIN 2.9*  --  2.8*  --   --  2.9* 3.0*  --  3.0* 3.1* 3.0*  CALCIUM 8.7*  --  8.0*  --   --  8.0* 8.0*  --  8.2* 8.5* 8.5*  PHOS 4.4  --  3.8  --   --  4.4 3.2  --  5.8* 5.2* 5.6*  5.7*   < > = values in this interval not displayed.   Liver Function Tests: Recent Labs  Lab 04/05/20 0456 04/05/20 1353 04/06/20 0701 04/07/20 0240 04/08/20 0359  PROT  --  5.6*  --   --   --   ALBUMIN   < >  --  3.0* 3.1* 3.0*   < > =  values in this interval not displayed.   No results for input(s): LIPASE, AMYLASE in the last 168 hours. No results for input(s): AMMONIA in the last 168 hours. CBC: Recent Labs  Lab 04/05/20 0456 04/05/20 0456 04/05/20 1353 04/05/20 2034 04/06/20 0701 04/07/20 0240 04/08/20 0359  WBC 9.4   < > 9.3  --  7.8 10.8* 8.2  NEUTROABS  --   --  8.6*  --   --   --   --   HGB 8.4*   < > 8.6*   < > 7.7* 8.7* 8.4*  HCT 27.4*   < > 27.5*   < > 25.0* 27.9* 26.2*  MCV 91.6  --  92.3  --  93.3 93.3 93.9  PLT 161   < > 154  --  154 240 175   < > = values in this interval not displayed.   Cardiac Enzymes: No results for input(s): CKTOTAL, CKMB, CKMBINDEX, TROPONINI in the last 168 hours. CBG: Recent Labs  Lab 04/07/20 1541 04/07/20 1940 04/07/20 2352 04/08/20 0350 04/08/20 0717  GLUCAP 111* 166* 133* 152* 114*    Iron Studies: No results for input(s): IRON, TIBC, TRANSFERRIN, FERRITIN in the last 72 hours. Studies/Results: DG CHEST PORT 1 VIEW  Result Date: 04/07/2020 CLINICAL DATA:  74 year old female with respiratory failure EXAM: PORTABLE CHEST 1 VIEW COMPARISON:  04/06/2020 FINDINGS: None cardiomediastinal silhouette unchanged in size and contour. Endotracheal tube is unchanged terminating approximately 3.4 cm above the carina. Unchanged right IJ tunneled hemodialysis catheter. Unchanged gastric tube terminating out of the field of view. Similar appearance of the lungs with coarsened interstitial markings and opacity at the left lung base obscuring the left hemidiaphragm. No pneumothorax. IMPRESSION: Similar appearance of the chest x-ray with mild bilateral interstitial opacities and left basilar opacity which may reflect a combination of atelectasis/consolidation and/or pleural fluid. Unchanged endotracheal tube, gastric tube, right IJ hemodialysis catheter. Electronically Signed   By: Corrie Mckusick D.O.   On: 04/07/2020 09:00   ECHOCARDIOGRAM COMPLETE  Result Date: 04/06/2020     ECHOCARDIOGRAM REPORT   Patient Name:   Jasmine White Date of Exam: 04/06/2020 Medical Rec #:  [id number]       Height:       66.0 in Accession #:    [id number]      Weight:       143.3 lb Date of Birth:  07/12/1946       BSA:          1.736 m Patient Age:    17 years        BP:           145/73 mmHg Patient Gender: F               HR:           90 bpm. Exam Location:  Inpatient Procedure: 2D Echo, Cardiac Doppler and Color Doppler Indications:    I50.23 Acute on chronic systolic (congestive) heart failure  History:        Patient has no prior history of Echocardiogram examinations.                 Risk Factors:Hypertension and Dyslipidemia. Cancer.                 Hypothyroidism.  Sonographer:    Jonelle Sidle Dance Referring Phys: [id number] Candee Furbish  Sonographer Comments: Echo performed with patient supine and on artificial respirator. IMPRESSIONS  1. Left ventricular ejection fraction, by estimation, is 55 to 60%. The left ventricle has normal function. The left ventricle has no regional wall motion abnormalities. Left ventricular diastolic parameters are consistent with Grade I diastolic dysfunction (impaired relaxation).  2. Right ventricular systolic function is normal. The right ventricular size is normal.  3. Left atrial size was mildly dilated.  4. Trivial pericardial effusion. Large pleural effusion in the left lateral region.  5. The mitral valve is normal in structure. Moderate mitral valve regurgitation. No evidence of mitral stenosis.  6. Aortic regurgitation - vena contracta 0.58 cm. The aortic valve is tricuspid. Aortic valve regurgitation is moderate to severe. Mild aortic valve sclerosis is present, with no evidence of aortic valve stenosis.  7. The  inferior vena cava is normal in size with greater than 50% respiratory variability, suggesting right atrial pressure of 3 mmHg. FINDINGS  Left Ventricle: Left ventricular ejection fraction, by estimation, is 55 to 60%. The left ventricle has normal  function. The left ventricle has no regional wall motion abnormalities. The left ventricular internal cavity size was normal in size. There is  no left ventricular hypertrophy. Left ventricular diastolic parameters are consistent with Grade I diastolic dysfunction (impaired relaxation). Right Ventricle: The right ventricular size is normal. No increase in right ventricular wall thickness. Right ventricular systolic function is normal. Left Atrium: Left atrial size was mildly dilated. Right Atrium: Right atrial size was normal in size. Pericardium: Trivial pericardial effusion. Trivial pericardial effusion is present. Mitral Valve: The mitral valve is normal in structure. Normal mobility of the mitral valve leaflets. Mild mitral annular calcification. Moderate mitral valve regurgitation. No evidence of mitral valve stenosis. Tricuspid Valve: The tricuspid valve is normal in structure. Tricuspid valve regurgitation is mild . No evidence of tricuspid stenosis. Aortic Valve: Aortic regurgitation - vena contracta 0.58 cm. The aortic valve is tricuspid. Aortic valve regurgitation is moderate to severe. Aortic regurgitation PHT measures 185 msec. Mild aortic valve sclerosis is present, with no evidence of aortic valve stenosis. Pulmonic Valve: The pulmonic valve was normal in structure. Pulmonic valve regurgitation is not visualized. No evidence of pulmonic stenosis. Aorta: The aortic root is normal in size and structure. Venous: The inferior vena cava is normal in size with greater than 50% respiratory variability, suggesting right atrial pressure of 3 mmHg. IAS/Shunts: No atrial level shunt detected by color flow Doppler. Additional Comments: There is a large pleural effusion in the left lateral region.  LEFT VENTRICLE PLAX 2D LVIDd:         4.90 cm  Diastology LVIDs:         3.30 cm  LV e' lateral:   5.77 cm/s LV PW:         0.80 cm  LV E/e' lateral: 22.7 LV IVS:        0.80 cm  LV e' medial:    5.55 cm/s LVOT diam:      2.00 cm  LV E/e' medial:  23.6 LV SV:         76 LV SV Index:   44 LVOT Area:     3.14 cm  RIGHT VENTRICLE             IVC RV Basal diam:  3.30 cm     IVC diam: 1.90 cm RV Mid diam:    2.70 cm RV S prime:     14.80 cm/s TAPSE (M-mode): 2.8 cm LEFT ATRIUM             Index       RIGHT ATRIUM           Index LA diam:        3.20 cm 1.84 cm/m  RA Area:     13.90 cm LA Vol (A2C):   79.5 ml 45.81 ml/m RA Volume:   32.70 ml  18.84 ml/m LA Vol (A4C):   32.7 ml 18.84 ml/m LA Biplane Vol: 52.6 ml 30.31 ml/m  AORTIC VALVE LVOT Vmax:   95.60 cm/s LVOT Vmean:  73.900 cm/s LVOT VTI:    0.241 m AI PHT:      185 msec  AORTA Ao Root diam: 3.40 cm Ao Asc diam:  3.70 cm MITRAL VALVE MV Area (PHT): 4.60 cm  SHUNTS MV Decel Time: 165 msec      Systemic VTI:  0.24 m MR Peak grad:    136.4 mmHg  Systemic Diam: 2.00 cm MR Mean grad:    92.0 mmHg MR Vmax:         584.00 cm/s MR Vmean:        457.0 cm/s MR PISA:         1.57 cm MR PISA Eff ROA: 10 mm MR PISA Radius:  0.50 cm MV E velocity: 131.00 cm/s MV A velocity: 122.00 cm/s MV E/A ratio:  1.07 Candee Furbish MD Electronically signed by Candee Furbish MD Signature Date/Time: 04/06/2020/11:38:12 AM    Final    . chlorhexidine gluconate (MEDLINE KIT)  15 mL Mouth Rinse BID  . Chlorhexidine Gluconate Cloth  6 each Topical Q0600  . cholecalciferol  1,000 Units Oral Daily  . cyclophosphamide  50 mg Oral q1800  . darbepoetin (ARANESP) injection - DIALYSIS  100 mcg Intravenous Q Fri-HD  . heparin injection (subcutaneous)  5,000 Units Subcutaneous Q8H  . insulin aspart  0-24 Units Subcutaneous Q4H  . [START ON 04/09/2020] levothyroxine  88 mcg Oral Q0600  . mouth rinse  15 mL Mouth Rinse 10 times per day  . methylPREDNISolone (SOLU-MEDROL) injection  40 mg Intravenous Q8H  . metoprolol tartrate  100 mg Oral BID  . pantoprazole  40 mg Oral Daily  . sodium chloride flush  10-40 mL Intracatheter Q12H  . sulfamethoxazole-trimethoprim  1 tablet Oral Q M,W,F    BMET     Component Value Date/Time   NA 137 04/08/2020 0359   NA 128 (L) 03/01/2020 0927   K 4.6 04/08/2020 0359   CL 92 (L) 04/08/2020 0359   CO2 30 04/08/2020 0359   GLUCOSE 154 (H) 04/08/2020 0359   BUN 59 (H) 04/08/2020 0359   BUN 67 (H) 03/01/2020 0927   CREATININE 3.99 (H) 04/08/2020 0359   CREATININE 0.84 12/21/2012 1552   CALCIUM 8.5 (L) 04/08/2020 0359   GFRNONAA 10 (L) 04/08/2020 0359   GFRNONAA 72 12/21/2012 1552   GFRAA 12 (L) 04/08/2020 0359   GFRAA 83 12/21/2012 1552   CBC    Component Value Date/Time   WBC 8.2 04/08/2020 0359   RBC 2.79 (L) 04/08/2020 0359   HGB 8.4 (L) 04/08/2020 0359   HGB 9.7 (L) 03/01/2020 0927   HCT 26.2 (L) 04/08/2020 0359   HCT 28.8 (L) 03/01/2020 0927   PLT 175 04/08/2020 0359   PLT 397 03/01/2020 0927   MCV 93.9 04/08/2020 0359   MCV 79 03/01/2020 0927   MCH 30.1 04/08/2020 0359   MCHC 32.1 04/08/2020 0359   RDW 17.7 (H) 04/08/2020 0359   RDW 14.7 03/01/2020 0927   LYMPHSABS 0.3 (L) 04/05/2020 1353   LYMPHSABS 1.3 03/01/2020 0927   MONOABS 0.3 04/05/2020 1353   EOSABS 0.0 04/05/2020 1353   EOSABS 0.2 03/01/2020 0927   BASOSABS 0.0 04/05/2020 1353   BASOSABS 0.1 03/01/2020 0927   Results for NETHRA, MEHLBERG (MRN [id number]) as of 04/08/2020 10:37  Ref. Range 03/18/2020 09:54 04/03/2020 04:54  GBM Ab Latest Ref Range: 0 - 20 units 137 (H) 55 (H)   Assessment/Plan:  1. Hypoxia:  Bronch with concern for alveolitis / [place]; also concern for aspiration.   1. Finished 3 additional TPE treatments 7/17 2. Rpt Anti GBM pending 3. Likely extubation today 2. Dialysis dependent oliguric,AKIdue tp RPGN from anti-GBM disease as well as +ANCA, ANA, dsDNA and MPO. completed daily plasmapheresis  and IV solumedrol now PO cytoxan and pred 40.  1. Biopsy with 100% crescents and significant amount of ATN. 2. [place] continue with HD on MWF schedule for now.  Next HD 7/19: 2K, 3.5h, 2-3L UF, no heparin, TDC 3. She completed2 weeks of PLEX treatments  (11 total over 14d); resent GBM Ab 7/13, still elevated and in light of hypoxia S/P PLEX x 3 more cycles, concluding 7/17 4. Continue cytoxan 50 mg daily 5. Developed agitation with high dose pred so it was decreased to 63m daily, cont for now.  6. Bactrim MWF for PCP prophylaxis 7. Continue to follow for renal recovery, however doubtful given extensive crescents on biopsy.  8. [facility]. Rpt GBM pending 3. HTN- BP stable.  On MTP only 4. Vascular access- RIJ TDC placed 03/20/20 by IR.   VVS placed BC AVF 04/02/20. 5. Thrombocytopenia - resolved, CTM 6. Anemia - Hb 8s, stable  darbe 100 qFriday  7. [facility]

## 2020-04-08 NOTE — Progress Notes (Signed)
NAME:  Jasmine White, MRN:  [id number], DOB:  09-Dec-1945, LOS: 53 ADMISSION DATE:  03/17/2020, CONSULTATION DATE:  04/05/20 REFERRING MD:  Mal Misty, CHIEF COMPLAINT:  Dyspnea   Brief History   74 yo female presented to ER with nausea and vomiting in setting of hypovolemic hyponatremia, UTI and new onset renal failure.  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  Started on steroids, cytoxan, plasmapheresis.  Developed progressive dyspnea and hypoxia with b/l GGO on CT chest.  Developed progressive hypoxia and required intubation.  Past Medical History  Chronic back pain, HTN, Hypothyroidism, CKD, HLD, COVID 11 August 2019  [facility] Events   6/27 admitted 6/29 Rt IJ HD catheter by IR, start solumedrol 6/30 start plasmapheresis 7/01 Lt renal biopsy >> nearly 100% crescents with significant ATN 7/02 change to prednisone, start cytoxan 7/03 short of breath 7/12 change prednisone to 40 mg daily; Lt brachiocephalic AV fistula created 7/15 worsening hypoxia, VDRF, change back to solumedrol 7/17 complete PLEX  Consults:  Nephrology IR Vascular surgery  Procedures:  6/29 IR tunneled HD cath 7/15 ETT > 7/18  Significant Diagnostic Tests:   ANCA 6/27 >> MPO 10.3 (nl 0 - 9)  GBM Ab 6/27 >> 137 (nl 0 - 20)  ANA 6/27 >> positive, ds DNA Ab 50 (nl < 5)  CT abd/pelvis 03/17/20 >> inspissated fecal material, gallstones  V/Q scan 7/01 >> negative  CT chest 7/03 >> mosaic areas of GGO and consolidation, mod b/l effusions  GMB Ab 7/13 >> 55  CT chest 7/15 >> large b/l effusions with compressive ATX, extensive ASD  Rt thoracentesis 7/15 >> 900 cc straw colored fluid, glucose 133, protein < 3, LDH 50, WBC 63 (72%M)  Bronchoscopy 7/15 >> WBC 125 (68%N)  Echo 7/16 >>  EF 55 to 60%, grade 1 DD, large Lt effusion, mod MR, mod/severe AR  GMB Ab 7/18 >>   Micro Data:  COVID 6/26 >> negative MRSA PCR 6/26 >>  negative PCP BAL 7/15 >>  AFB BAL 7/15 >>  Fungal BAL 7/15 >> BAL 7/15 >> Blood 7/15 >>  Rt pleural fluid 7/15 >>   Antimicrobials:  Bactrim ppx >>  Interim history/subjective:  Pressure support.  Objective   Blood pressure (!) 149/65, pulse 84, temperature 98.3 F (36.8 C), temperature source Axillary, resp. rate 18, height _0  (1.676 m), weight 58.7 kg, SpO2 93 %.    Vent Mode: PSV;CPAP FiO2 (%):  [40 %] 40 % Set Rate:  [14 bmp] 14 bmp Vt Set:  [450 mL] 450 mL PEEP:  [5 cmH20] 5 cmH20 Pressure Support:  [10 cmH20] 10 cmH20 Plateau Pressure:  [18 cmH20-22 cmH20] 22 cmH20   Intake/Output Summary (Last 24 hours) at 04/08/2020 3662 Last data filed at 04/08/2020 0700 Gross per 24 hour  Intake 1322.5 ml  Output --  Net 1322.5 ml   Filed Weights   04/06/20 1654 04/07/20 0500 04/08/20 0500  Weight: 60.2 kg 61 kg 58.7 kg    Examination:  General - alert Eyes - pupils reactive ENT - ETT in place Cardiac - regular rate/rhythm, no murmur Chest - equal breath sounds b/l, no wheezing or rales Abdomen - soft, non tender, + bowel sounds Extremities - no cyanosis, clubbing, or edema Skin - no rashes Neuro - RASS 0   Resolved Hospital Problem list   UTI  Assessment & Plan:   Acute hypoxic respiratory failure from b/l pulmonary infiltrates in setting of GBM/ANCA positive vasculitis  and transudate pleural effusions. - extubation trial 7/18 - f/u CXR intermittently - completed PLEX 7/17 - f/u BAL from 7/15   AKI from RPGN 2nd to Anti GBM disease with positive MPO, dsDNA. - steroids, cytoxan per nephrology - bactrim prophylaxis  - iHD M/W/F - f/u GBM Ab from 7/18 - even to negative fluid balance as tolerate  Hx of HTN. - goal SBP < 150 - continue lopressor  Hx of hypothyroidism. - continue synthroid  Steroid induced hyperglycemia. - SSI while on steroids  Anemia of critical illness. - f/u CBC - transfuse for Hb < 7 or significant bleeding  D/w Dr.  Joelyn Oms.  Best practice:  Diet: carb mod/renal diet DVT prophylaxis: heparin GI prophylaxis: protonix Mobility: bed rest Code Status: Full Disposition: ICU   Labs    CMP Latest Ref Rng & Units 04/08/2020 04/07/2020 04/06/2020  Glucose 70 - 99 mg/dL 154(H) 117(H) 103(H)  BUN 8 - 23 mg/dL 59(H) 26(H) 32(H)  Creatinine 0.44 - 1.00 mg/dL 3.99(H) 2.65(H) 4.31(H)  Sodium 135 - 145 mmol/L 137 136 138  Potassium 3.5 - 5.1 mmol/L 4.6 4.4 5.1  Chloride 98 - 111 mmol/L 92(L) 95(L) 96(L)  CO2 22 - 32 mmol/L _0 Calcium 8.9 - 10.3 mg/dL 8.5(L) 8.5(L) 8.2(L)  Total Protein 6.5 - 8.1 g/dL - - -  Total Bilirubin 0.3 - 1.2 mg/dL - - -  Alkaline Phos 38 - 126 U/L - - -  AST 15 - 41 U/L - - -  ALT 0 - 44 U/L - - -    CBC Latest Ref Rng & Units 04/08/2020 04/07/2020 04/06/2020  WBC 4.0 - 10.5 K/uL 8.2 10.8(H) 7.8  Hemoglobin 12.0 - 15.0 g/dL 8.4(L) 8.7(L) 7.7(L)  Hematocrit 36 - 46 % 26.2(L) 27.9(L) 25.0(L)  Platelets 150 - 400 K/uL 175 240 154    ABG    Component Value Date/Time   PHART 7.551 (H) 04/05/2020 2034   PCO2ART 40.7 04/05/2020 2034   PO2ART 167 (H) 04/05/2020 2034   HCO3 35.8 (H) 04/05/2020 2034   TCO2 37 (H) 04/05/2020 2034   O2SAT 100.0 04/05/2020 2034    CBG (last 3)  Recent Labs    04/07/20 2352 04/08/20 0350 04/08/20 0717  GLUCAP 133* 152* 114*    Critical care time: 34 minutes.  Chesley Mires, MD [place] Pager - [phone] 04/08/2020, 8:32 AM

## 2020-04-08 NOTE — Procedures (Signed)
Extubation Procedure Note  Patient Details:   Name: Jasmine White DOB: 1946-01-16 MRN: [id number]   Airway Documentation:    Vent end date: 04/08/20 Vent end time: 0855   Evaluation  O2 sats: stable throughout Complications: No apparent complications Patient did tolerate procedure well. Bilateral Breath Sounds: Clear   Yes   Patient was extubated to a 4L [place] without any complications, dyspnea or stridor noted. Patients was instructed on IS, highest goal reached was 745mL.   Claretta Fraise 04/08/2020, 08:55 AM

## 2020-04-09 DIAGNOSIS — D649 Anemia, unspecified: Secondary | ICD-10-CM

## 2020-04-09 DIAGNOSIS — I776 Arteritis, unspecified: Secondary | ICD-10-CM

## 2020-04-09 DIAGNOSIS — D849 Immunodeficiency, unspecified: Secondary | ICD-10-CM

## 2020-04-09 DIAGNOSIS — R739 Hyperglycemia, unspecified: Secondary | ICD-10-CM

## 2020-04-09 DIAGNOSIS — T380X5A Adverse effect of glucocorticoids and synthetic analogues, initial encounter: Secondary | ICD-10-CM

## 2020-04-09 LAB — CBC
HCT: 24.1 % — ABNORMAL LOW (ref 36.0–46.0)
Hemoglobin: 7.8 g/dL — ABNORMAL LOW (ref 12.0–15.0)
MCH: 30 pg (ref 26.0–34.0)
MCHC: 32.4 g/dL (ref 30.0–36.0)
MCV: 92.7 fL (ref 80.0–100.0)
Platelets: 218 10*3/uL (ref 150–400)
RBC: 2.6 MIL/uL — ABNORMAL LOW (ref 3.87–5.11)
RDW: 18 % — ABNORMAL HIGH (ref 11.5–15.5)
WBC: 9 10*3/uL (ref 4.0–10.5)
nRBC: 0 % (ref 0.0–0.2)

## 2020-04-09 LAB — RENAL FUNCTION PANEL
Albumin: 2.9 g/dL — ABNORMAL LOW (ref 3.5–5.0)
Anion gap: 14 (ref 5–15)
BUN: 79 mg/dL — ABNORMAL HIGH (ref 8–23)
CO2: 29 mmol/L (ref 22–32)
Calcium: 8 mg/dL — ABNORMAL LOW (ref 8.9–10.3)
Chloride: 89 mmol/L — ABNORMAL LOW (ref 98–111)
Creatinine, Ser: 5.69 mg/dL — ABNORMAL HIGH (ref 0.44–1.00)
GFR calc Af Amer: 8 mL/min — ABNORMAL LOW (ref >60)
GFR calc non Af Amer: 7 mL/min — ABNORMAL LOW (ref >60)
Glucose, Bld: 177 mg/dL — ABNORMAL HIGH (ref 70–99)
Phosphorus: 5.9 mg/dL — ABNORMAL HIGH (ref 2.5–4.6)
Potassium: 4.4 mmol/L (ref 3.5–5.1)
Sodium: 132 mmol/L — ABNORMAL LOW (ref 135–145)

## 2020-04-09 LAB — BODY FLUID CULTURE: Culture: NO GROWTH

## 2020-04-09 LAB — GLUCOSE, CAPILLARY
Glucose-Capillary: 148 mg/dL — ABNORMAL HIGH (ref 70–99)
Glucose-Capillary: 83 mg/dL (ref 70–99)
Glucose-Capillary: 173 mg/dL — ABNORMAL HIGH (ref 70–99)
Glucose-Capillary: 96 mg/dL (ref 70–99)
Glucose-Capillary: 114 mg/dL — ABNORMAL HIGH (ref 70–99)

## 2020-04-09 LAB — PHOSPHORUS: Phosphorus: 5.9 mg/dL — ABNORMAL HIGH (ref 2.5–4.6)

## 2020-04-09 LAB — MAGNESIUM: Magnesium: 2.2 mg/dL (ref 1.7–2.4)

## 2020-04-09 MED ORDER — METHYLPREDNISOLONE SODIUM SUCC 125 MG IJ SOLR
40.0000 mg | Freq: Two times a day (BID) | INTRAMUSCULAR | Status: DC
  Administered 2020-04-09 – 2020-04-10 (×2): 40 mg via INTRAVENOUS
  Filled 2020-04-09 (×2): qty 2

## 2020-04-09 MED ORDER — INSULIN ASPART 100 UNIT/ML ~~LOC~~ SOLN
0.0000 [IU] | Freq: Three times a day (TID) | SUBCUTANEOUS | Status: DC
  Administered 2020-04-09: 3 [IU] via SUBCUTANEOUS
  Administered 2020-04-10: 2 [IU] via SUBCUTANEOUS

## 2020-04-09 MED ORDER — PROSOURCE PLUS PO LIQD
30.0000 mL | Freq: Two times a day (BID) | ORAL | Status: DC
  Administered 2020-04-10 (×2): 30 mL via ORAL
  Filled 2020-04-09 (×3): qty 30

## 2020-04-09 MED ORDER — HEPARIN SODIUM (PORCINE) 1000 UNIT/ML IJ SOLN
INTRAMUSCULAR | Status: AC
  Administered 2020-04-09: 3200 [IU]
  Filled 2020-04-09: qty 4

## 2020-04-09 MED ORDER — NEPRO/CARBSTEADY PO LIQD
237.0000 mL | Freq: Two times a day (BID) | ORAL | Status: DC
  Administered 2020-04-10: 237 mL via ORAL

## 2020-04-09 MED ORDER — INSULIN ASPART 100 UNIT/ML ~~LOC~~ SOLN: 0.0000 [IU] | Freq: Every day | SUBCUTANEOUS | Status: DC

## 2020-04-09 NOTE — Progress Notes (Signed)
Physical Therapy Treatment Patient Details Name: Jasmine White MRN: [id number] DOB: Jan 08, 1946 Today's Date: 04/09/2020    History of Present Illness 74 y.o. female with medical history significant of chronic back pain, HTN, hypothyroidism, recently diagnosed CKD, HLD, Hx of COVID infection and recovery with persistent fatigue who presented 03/18/20 with nausea/vomiting x 9 days. Severe hypovolemic hyponatremia, AKI, UTI, HTN. 7/15 pt with thoracentesis, bronchoscopy and intubation.  Latest extubation 7/18    PT Comments    Pt agreeable, feels safe going to the bathroom, but is still unsteady overall.  Emphasis on improving gait stability and stamina.    Follow Up Recommendations  Home health PT;Supervision/Assistance - 24 hour     Equipment Recommendations  None recommended by PT    Recommendations for Other Services       Precautions / Restrictions Precautions Precautions: Fall    Mobility  Bed Mobility Overal bed mobility: Modified Independent                Transfers Overall transfer level: Needs assistance Equipment used: None Transfers: Sit to/from Stand Sit to Stand: Supervision            Ambulation/Gait Ambulation/Gait assistance: Min assist;Min guard Gait Distance (Feet): 280 Feet Assistive device: None Gait Pattern/deviations: Step-through pattern;Decreased stride length Gait velocity: decr Gait velocity interpretation: 1.31 - 2.62 ft/sec, indicative of limited community ambulator General Gait Details: slower, guarded, drifting with increasing unsteadiness with increased distance.  Sats 96% on RA and EHR up to 108 bpm   Stairs             Wheelchair Mobility    Modified Rankin (Stroke Patients Only)       Balance Overall balance assessment: Needs assistance   Sitting balance-Leahy Scale: Good     Standing balance support: During functional activity;No upper extremity supported Standing balance-Leahy Scale: Good                               Cognition Arousal/Alertness: Awake/alert Behavior During Therapy: WFL for tasks assessed/performed Overall Cognitive Status:  (mild issue noted, repeating herself often)                     Current Attention Level: Selective     Safety/Judgement: Decreased awareness of deficits            Exercises      General Comments General comments (skin integrity, edema, etc.): vss      Pertinent Vitals/Pain Pain Assessment: No/denies pain Pain Intervention(s): Monitored during session    Home Living                      Prior Function            PT Goals (current goals can now be found in the care plan section) Acute Rehab PT Goals Time For Goal Achievement: 04/02/20 Potential to Achieve Goals: Good Progress towards PT goals: Progressing toward goals    Frequency    Min 3X/week      PT Plan Current plan remains appropriate    Co-evaluation              AM-PAC PT "6 Clicks" Mobility   Outcome Measure  Help needed turning from your back to your side while in a flat bed without using bedrails?: None Help needed moving from lying on your back to sitting on the side of a flat bed without  using bedrails?: None Help needed moving to and from a bed to a chair (including a wheelchair)?: None Help needed standing up from a chair using your arms (e.g., wheelchair or bedside chair)?: None Help needed to walk in hospital room?: A Little Help needed climbing 3-5 steps with a railing? : A Little 6 Click Score: 22    End of Session   Activity Tolerance: Patient tolerated treatment well Patient left: in chair;with call bell/phone within reach   PT Visit Diagnosis: Other abnormalities of gait and mobility (R26.89);Unsteadiness on feet (R26.81);Muscle weakness (generalized) (M62.81)     Time: 8832-5498 PT Time Calculation (min) (ACUTE ONLY): 10 min  Charges:  $Gait Training: 8-22 mins                      04/09/2020  Ginger Carne., PT Acute Rehabilitation Services [phone]  (pager) [phone]  (office)   Tessie Fass Tristian Sickinger 04/09/2020, 12:30 PM

## 2020-04-09 NOTE — Progress Notes (Signed)
Patient ID: Jasmine White, female   DOB: 1946/05/28, 74 y.o.   MRN: [id number] S:   No complaints, feels great. Denies chest pain, sob.  O:BP (!) 148/71   Pulse 78   Temp 98.5 F (36.9 C) (Oral)   Resp 16   Ht _0  (1.676 m)   Wt 66.3 kg   SpO2 99%   BMI 23.58 kg/m   Intake/Output Summary (Last 24 hours) at 04/09/2020 1316 Last data filed at 04/09/2020 0400 Gross per 24 hour  Intake 850 ml  Output --  Net 850 ml   Intake/Output: I/O last 3 completed shifts: In: 1531.9 [P.O.:850; I.V.:31.9; NG/GT:650] Out: -   Intake/Output this shift:  No intake/output data recorded. Weight change: 7.571 kg Gen: nad, sitting up in bed Skin: R IJ TDC C/D/I CVS: rrr, s1s2, no m/r/g Resp: cta bl Abd:+BS, soft, nt/nd Ext: no edema, LUE AVF incision c/d/i with faint bruit and thrill  Recent Labs  Lab 04/03/20 0454 04/03/20 0454 04/03/20 1520 04/03/20 1520 04/04/20 0827 04/05/20 0456 04/05/20 2034 04/06/20 0701 04/07/20 0240 04/08/20 0359 04/09/20 0316  NA 137   < > 136   < > 139 136 137 138 136 137 132*  K 4.6   < > 4.7   < > 4.0 3.9 5.0 5.1 4.4 4.6 4.4  CL 96*   < > 93*  --  97* 98  --  96* 95* 92* 89*  CO2 30  --   --   --  29 26  --  _1 GLUCOSE 87   < > 207*  --  152* 109*  --  103* 117* 154* 177*  BUN 22   < > 30*  --  41* 19  --  32* 26* 59* 79*  CREATININE 3.52*   < > 4.00*  --  5.10* 2.99*  --  4.31* 2.65* 3.99* 5.69*  ALBUMIN 2.8*  --   --   --  2.9* 3.0*  --  3.0* 3.1* 3.0* 2.9*  CALCIUM 8.0*  --   --   --  8.0* 8.0*  --  8.2* 8.5* 8.5* 8.0*  PHOS 3.8  --   --   --  4.4 3.2  --  5.8* 5.2* 5.6*  5.7* 5.9*  5.9*   < > = values in this interval not displayed.   Liver Function Tests: Recent Labs  Lab 04/05/20 1353 04/06/20 0701 04/07/20 0240 04/08/20 0359 04/09/20 0316  PROT 5.6*  --   --   --   --   ALBUMIN  --    < > 3.1* 3.0* 2.9*   < > = values in this interval not displayed.   No results for input(s): LIPASE, AMYLASE in the last 168 hours. No  results for input(s): AMMONIA in the last 168 hours. CBC: Recent Labs  Lab 04/05/20 1353 04/05/20 2034 04/06/20 0701 04/06/20 0701 04/07/20 0240 04/08/20 0359 04/09/20 0316  WBC 9.3  --  7.8   < > 10.8* 8.2 9.0  NEUTROABS 8.6*  --   --   --   --   --   --   HGB 8.6*   < > 7.7*   < > 8.7* 8.4* 7.8*  HCT 27.5*   < > 25.0*   < > 27.9* 26.2* 24.1*  MCV 92.3  --  93.3  --  93.3 93.9 92.7  PLT 154  --  154   < > 240 175 218   < > =  values in this interval not displayed.   Cardiac Enzymes: No results for input(s): CKTOTAL, CKMB, CKMBINDEX, TROPONINI in the last 168 hours. CBG: Recent Labs  Lab 04/08/20 1957 04/08/20 2355 04/09/20 0420 04/09/20 0729 04/09/20 1120  GLUCAP 176* 101* 148* 83 173*    Iron Studies: No results for input(s): IRON, TIBC, TRANSFERRIN, FERRITIN in the last 72 hours. Studies/Results: DG Chest Port 1 View  Result Date: 04/08/2020 CLINICAL DATA:  Respiratory failure. EXAM: PORTABLE CHEST 1 VIEW COMPARISON:  April 07, 2020 FINDINGS: Stable support apparatus. Improved aeration of the lungs with less prominent bilateral opacities. Small bilateral pleural effusions. Cardiomediastinal silhouette is normal. Mediastinal contours appear intact. IMPRESSION: 1. Improved aeration of the lungs with less prominent bilateral airspace opacities. 2. Small bilateral pleural effusions. Electronically Signed   By: Fidela Salisbury M.D.   On: 04/08/2020 15:12   . (feeding supplement) PROSource Plus  30 mL Oral BID BM  . chlorhexidine gluconate (MEDLINE KIT)  15 mL Mouth Rinse BID  . Chlorhexidine Gluconate Cloth  6 each Topical Q0600  . cholecalciferol  1,000 Units Oral Daily  . cyclophosphamide  50 mg Oral q1800  . darbepoetin (ARANESP) injection - DIALYSIS  100 mcg Intravenous Q Fri-HD  . feeding supplement (NEPRO CARB STEADY)  237 mL Oral BID BM  . heparin injection (subcutaneous)  5,000 Units Subcutaneous Q8H  . insulin aspart  0-15 Units Subcutaneous TID WC  . insulin  aspart  0-5 Units Subcutaneous QHS  . levothyroxine  88 mcg Oral Q0600  . methylPREDNISolone (SOLU-MEDROL) injection  40 mg Intravenous Q12H  . metoprolol tartrate  100 mg Oral BID  . pantoprazole  40 mg Oral Daily  . sodium chloride flush  10-40 mL Intracatheter Q12H  . sulfamethoxazole-trimethoprim  1 tablet Oral Q M,W,F    BMET    Component Value Date/Time   NA 132 (L) 04/09/2020 0316   NA 128 (L) 03/01/2020 0927   K 4.4 04/09/2020 0316   CL 89 (L) 04/09/2020 0316   CO2 29 04/09/2020 0316   GLUCOSE 177 (H) 04/09/2020 0316   BUN 79 (H) 04/09/2020 0316   BUN 67 (H) 03/01/2020 0927   CREATININE 5.69 (H) 04/09/2020 0316   CREATININE 0.84 12/21/2012 1552   CALCIUM 8.0 (L) 04/09/2020 0316   GFRNONAA 7 (L) 04/09/2020 0316   GFRNONAA 72 12/21/2012 1552   GFRAA 8 (L) 04/09/2020 0316   GFRAA 83 12/21/2012 1552   CBC    Component Value Date/Time   WBC 9.0 04/09/2020 0316   RBC 2.60 (L) 04/09/2020 0316   HGB 7.8 (L) 04/09/2020 0316   HGB 9.7 (L) 03/01/2020 0927   HCT 24.1 (L) 04/09/2020 0316   HCT 28.8 (L) 03/01/2020 0927   PLT 218 04/09/2020 0316   PLT 397 03/01/2020 0927   MCV 92.7 04/09/2020 0316   MCV 79 03/01/2020 0927   MCH 30.0 04/09/2020 0316   MCHC 32.4 04/09/2020 0316   RDW 18.0 (H) 04/09/2020 0316   RDW 14.7 03/01/2020 0927   LYMPHSABS 0.3 (L) 04/05/2020 1353   LYMPHSABS 1.3 03/01/2020 0927   MONOABS 0.3 04/05/2020 1353   EOSABS 0.0 04/05/2020 1353   EOSABS 0.2 03/01/2020 0927   BASOSABS 0.0 04/05/2020 1353   BASOSABS 0.1 03/01/2020 0927   Results for MARIAISABEL, BODIFORD (MRN [id number]) as of 04/08/2020 10:37  Ref. Range 03/18/2020 09:54 04/03/2020 04:54  GBM Ab Latest Ref Range: 0 - 20 units 137 (H) 55 (H)   Assessment/Plan:  1. Hypoxia:  Bronch with concern for alveolitis / Quinter; also concern for aspiration.   1. Finished 3 additional TPE treatments 7/17. Hold on further tpe. 2. Rpt Anti GBM pending (sent 7/18) 2. 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 completed daily plasmapheresis and IV solumedrol now PO cytoxan and pred 40.  1. Biopsy with 100% crescents and significant amount of ATN. 2. [place] continue with HD on MWF schedule for now.  HD today: 2K, 3.5h, 2-3L UF, no heparin, TDC 3. She completed2 weeks of PLEX treatments (11 total over 14d); resent GBM Ab 7/13, still elevated and in light of hypoxia S/P PLEX x 3 more cycles, concluding 7/17. Repeat antigbm pending 7/18 4. Continue cytoxan 50 mg daily. Holding today in light of drop in hgb. 5. Developed agitation with high dose pred so it was decreased to 61m daily, cont for now.  6. Bactrim MWF for PCP prophylaxis 7. Continue to follow for renal recovery, however doubtful given extensive crescents on biopsy.  [facility]. Rpt GBM pending 3. HTN- BP stable.  On MTP only 4. Vascular access- RIJ TDC placed 03/20/20 by IR.   VVS placed BC AVF 04/02/20. 5. Thrombocytopenia - resolved, CTM 6. Anemia - Hb 8s, stable  darbe 100 qFriday  [facility]. [facility] [place]

## 2020-04-09 NOTE — Progress Notes (Signed)
Nutrition Follow-up  DOCUMENTATION CODES:   Not applicable  INTERVENTION:   - Nepro Shake po BID, each supplement provides 425 kcal and 19 grams protein  - ProSource Plus po 30 ml BID, each supplement provides 100 kcal and 15 grams of protein  - Encourage adequate PO intake  NUTRITION DIAGNOSIS:   Increased nutrient needs related to acute illness as evidenced by estimated needs.  Ongoing  GOAL:   Patient will meet greater than or equal to 90% of their needs  Progressing  MONITOR:   PO intake, Supplement acceptance, Labs, Weight trends  REASON FOR ASSESSMENT:   Ventilator, Consult Enteral/tube feeding initiation and management  ASSESSMENT:   74 yo female admitted with nausea and vomiting, severe hypovolemic hyponatremia in the setting of HCTZ use and AKI. PMH includes chronic back pain, HTN, hypothyroidism, COVID (07/2019), CKD.  7/17 - completed PLEX 7/18 - extubated, diet advanced to renal/carb modified with 1200 ml fluid restriction  Patient with RPGN from anti-GBM disease and has completed 2 weeks of PLEX treatments. Also receiving cytoxan.  Ongoing renal failure, renal recovery is not likely. Pt on iHD on MWF schedule.  Spoke with pt at bedside. Pt in good spirits and reports that her appetite is great. Prior to intubation, meal completions mostly 100%. No meal completions recorded after extubation.  Pt willing to consume oral nutrition supplements to aid in meeting kcal and protein needs. Discussed importance of po intake with a focus on protein. Pt expresses understanding.  Medications reviewed and include: cholecalciferol, cytoxan, SSI q 4 hours, solu-medrol, protonix  Labs reviewed: sodium 132, phosphorus 5.9, hemoglobin 7.8 CBG's: 83-176 x 24 hours  Diet Order:   Diet Order            Diet renal/carb modified with fluid restriction Diet-HS Snack? Nothing; Fluid restriction: 1200 mL Fluid; Room service appropriate? Yes; Fluid consistency: Thin  Diet  effective now                 EDUCATION NEEDS:   No education needs have been identified at this time  Skin:  Skin Assessment: Skin Integrity Issues: Incisions: left arm  Last BM:  04/09/20 small type 6  Height:   Ht Readings from Last 1 Encounters:  04/05/20 5\' 6"  (1.676 m)    Weight:   Wt Readings from Last 1 Encounters:  04/09/20 66.3 kg    Ideal Body Weight:  59.1 kg  BMI:  Body mass index is 23.58 kg/m.  Estimated Nutritional Needs:   Kcal:  1800-2000  Protein:  95-110 gm  Fluid:  1 L + UOP    Gaynell Face, MS, RD, LDN Inpatient Clinical Dietitian Please see AMiON for contact information.

## 2020-04-09 NOTE — Progress Notes (Signed)
Transfer Note:   Traveling Method: Bed Transferring Unit: 28M Mental Orientation: A&O X4 Telemetry: N/A Assessment: WDL Skin: WDL IV: WDL Pain: Denies Safety Measures: Safety Fall Prevention Plan has been given, discussed and signed Admission: Completed 55M Orientation: Patient has been orientated to the room, unit and staff.   Orders have been reviewed and implemented. Will continue to monitor the patient. Call light has been placed within reach and bed alarm has been activated.   Aneta Mins BSN, RN3

## 2020-04-09 NOTE — Progress Notes (Signed)
OT Cancellation Note  Patient Details Name: Jasmine White MRN: [id number] DOB: 1945-10-23   Cancelled Treatment:    Reason Eval/Treat Not Completed: Patient at procedure or test/ unavailable Pt currently off unit at HD. OT will return as time allows and pt is appropriate.   [facility] OTR/L Acute Rehabilitation Services Office: [place] 04/09/2020, 2:43 PM

## 2020-04-09 NOTE — Progress Notes (Addendum)
NAME:  Jasmine White, MRN:  [id number], DOB:  September 24, 1945, LOS: 37 ADMISSION DATE:  03/17/2020, CONSULTATION DATE:  04/05/20 REFERRING MD:  Mal Misty, CHIEF COMPLAINT:  Dyspnea   Brief History   74 yo female presented to ER with nausea and vomiting in setting of hypovolemic hyponatremia, UTI and new onset renal failure.  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  Started on steroids, cytoxan, plasmapheresis.  Developed progressive dyspnea and hypoxia with b/l GGO on CT chest.  Developed progressive hypoxia and required intubation.  Past Medical History  Chronic back pain, HTN, Hypothyroidism, CKD, HLD, COVID 11 August 2019  [place] [facility] Events   6/27 admitted 6/29 Rt IJ HD catheter by IR, start solumedrol 6/30 start plasmapheresis 7/01 Lt renal biopsy >> nearly 100% crescents with significant ATN 7/02 change to prednisone, start cytoxan 7/03 short of breath 7/12 change prednisone to 40 mg daily; Lt brachiocephalic AV fistula created 7/15 worsening hypoxia, VDRF, change back to solumedrol 7/17 complete PLEX (112 total) 7/18 extubated.  7/19 room air, doing well to transfer to medsurg  Consults:  Nephrology IR Vascular surgery  Procedures:  6/29 IR tunneled HD cath 7/15 ETT > 7/18  Significant Diagnostic Tests:   ANCA 6/27 >> MPO 10.3 (nl 0 - 9)  GBM Ab 6/27 >> 137 (nl 0 - 20)  ANA 6/27 >> positive, ds DNA Ab 50 (nl < 5)  CT abd/pelvis 03/17/20 >> inspissated fecal material, gallstones  V/Q scan 7/01 >> negative  CT chest 7/03 >> mosaic areas of GGO and consolidation, mod b/l effusions  GMB Ab 7/13 >> 55  CT chest 7/15 >> large b/l effusions with compressive ATX, extensive ASD  Rt thoracentesis 7/15 >> 900 cc straw colored fluid, glucose 133, protein < 3, LDH 50, WBC 63 (72%M)  Bronchoscopy 7/15 >> WBC 125 (68%N)  Echo 7/16 >>  EF 55 to 60%, grade 1 DD, large Lt effusion, mod MR, mod/severe AR  GMB  Ab 7/18 >>   Micro Data:  COVID 6/26 >> negative MRSA PCR 6/26 >> negative PCP BAL 7/15 >>  AFB BAL 7/15 >>  Fungal BAL 7/15 >> BAL 7/15 >> Blood 7/15 >>  Rt pleural fluid 7/15 >> neg  Antimicrobials:  Bactrim ppx >>  Interim history/subjective:   Feels well.  No distress.  Is reporting some heartburn this morning Objective   Blood pressure (Abnormal) 161/74, pulse 81, temperature 98.5 F (36.9 C), temperature source Oral, resp. rate 16, height _0  (1.676 m), weight 66.3 kg, SpO2 100 %.        Intake/Output Summary (Last 24 hours) at 04/09/2020 1033 Last data filed at 04/09/2020 0400 Gross per 24 hour  Intake 850 ml  Output no documentation  Net 850 ml   Filed Weights   04/07/20 0500 04/08/20 0500 04/09/20 0600  Weight: 61 kg 58.7 kg 66.3 kg    Examination: General this is a pleasant 74 year old white female she is sitting up in bed currently in no acute distress HEENT normocephalic atraumatic no jugular venous distention is appreciated Pulmonary: Faint crackles left base, no accessory use currently on room air Cardiac regular rate and rhythm without murmur rub or gallop Abdomen soft nontender no organomegaly Extremities are warm and dry with brisk capillary refill Neuro awake oriented no focal deficits GU anuric  Resolved Hospital Problem list   UTI  Assessment & Plan:   Acute hypoxic respiratory failure from b/l pulmonary infiltrates in setting of GBM/ANCA positive  vasculitis and transudate pleural effusions. - extubation trial 7/18 - completed PLEX 7/17 -pcxr improved aeration bilaterally  Plan Wean oxygen  Pulse ox   AKI from RPGN 2nd to Anti GBM disease with positive MPO, dsDNA. Plan Cont steroids and cytoxan per nephro Cont bactrim prophylaxis  iHD MWF Aim for neg fluid balance  F/u (P) GBM Ab   Hx of HTN. Plan Cont lopressor  Hx of hypothyroidism. Plan Cont synthroid   Steroid induced hyperglycemia. Plan SSI   Anemia of critical  illness. Plan Trend cbc  Transfuse for hgb < 7   Reflux Plan Cont PPI  Best practice:  Diet: carb mod/renal diet DVT prophylaxis: heparin GI prophylaxis: protonix Mobility: bed rest Code Status: Full Disposition: ICU -->renal floor.   Labs    CMP Latest Ref Rng & Units 04/09/2020 04/08/2020 04/07/2020  Glucose 70 - 99 mg/dL 177(H) 154(H) 117(H)  BUN 8 - 23 mg/dL 79(H) 59(H) 26(H)  Creatinine 0.44 - 1.00 mg/dL 5.69(H) 3.99(H) 2.65(H)  Sodium 135 - 145 mmol/L 132(L) 137 136  Potassium 3.5 - 5.1 mmol/L 4.4 4.6 4.4  Chloride 98 - 111 mmol/L 89(L) 92(L) 95(L)  CO2 22 - 32 mmol/L _0 Calcium 8.9 - 10.3 mg/dL 8.0(L) 8.5(L) 8.5(L)  Total Protein 6.5 - 8.1 g/dL - - -  Total Bilirubin 0.3 - 1.2 mg/dL - - -  Alkaline Phos 38 - 126 U/L - - -  AST 15 - 41 U/L - - -  ALT 0 - 44 U/L - - -    CBC Latest Ref Rng & Units 04/09/2020 04/08/2020 04/07/2020  WBC 4.0 - 10.5 K/uL 9.0 8.2 10.8(H)  Hemoglobin 12.0 - 15.0 g/dL 7.8(L) 8.4(L) 8.7(L)  Hematocrit 36 - 46 % 24.1(L) 26.2(L) 27.9(L)  Platelets 150 - 400 K/uL 218 175 240    ABG    Component Value Date/Time   PHART 7.551 (H) 04/05/2020 2034   PCO2ART 40.7 04/05/2020 2034   PO2ART 167 (H) 04/05/2020 2034   HCO3 35.8 (H) 04/05/2020 2034   TCO2 37 (H) 04/05/2020 2034   O2SAT 100.0 04/05/2020 2034    CBG (last 3)  Recent Labs    04/08/20 2355 04/09/20 0420 04/09/20 0729  GLUCAP 101* 148* 83    Critical care time: NA  Erick Colace ACNP-BC [place] Pager # [phone] OR # [phone] if no answer

## 2020-04-10 DIAGNOSIS — N170 Acute kidney failure with tubular necrosis: Principal | ICD-10-CM

## 2020-04-10 DIAGNOSIS — Z5181 Encounter for therapeutic drug level monitoring: Secondary | ICD-10-CM

## 2020-04-10 LAB — CULTURE, BLOOD (ROUTINE X 2)
Culture: NO GROWTH
Culture: NO GROWTH
Special Requests: ADEQUATE
Special Requests: ADEQUATE

## 2020-04-10 LAB — CBC
HCT: 24.3 % — ABNORMAL LOW (ref 36.0–46.0)
Hemoglobin: 7.7 g/dL — ABNORMAL LOW (ref 12.0–15.0)
MCH: 30.2 pg (ref 26.0–34.0)
MCHC: 31.7 g/dL (ref 30.0–36.0)
MCV: 95.3 fL (ref 80.0–100.0)
Platelets: 239 10*3/uL (ref 150–400)
RBC: 2.55 MIL/uL — ABNORMAL LOW (ref 3.87–5.11)
RDW: 18.5 % — ABNORMAL HIGH (ref 11.5–15.5)
WBC: 7.1 10*3/uL (ref 4.0–10.5)
nRBC: 0 % (ref 0.0–0.2)

## 2020-04-10 LAB — RENAL FUNCTION PANEL
Albumin: 2.9 g/dL — ABNORMAL LOW (ref 3.5–5.0)
Anion gap: 10 (ref 5–15)
BUN: 35 mg/dL — ABNORMAL HIGH (ref 8–23)
CO2: 29 mmol/L (ref 22–32)
Calcium: 8.3 mg/dL — ABNORMAL LOW (ref 8.9–10.3)
Chloride: 98 mmol/L (ref 98–111)
Creatinine, Ser: 3.65 mg/dL — ABNORMAL HIGH (ref 0.44–1.00)
GFR calc Af Amer: 13 mL/min — ABNORMAL LOW (ref >60)
GFR calc non Af Amer: 12 mL/min — ABNORMAL LOW (ref >60)
Glucose, Bld: 129 mg/dL — ABNORMAL HIGH (ref 70–99)
Phosphorus: 4.6 mg/dL (ref 2.5–4.6)
Potassium: 3.7 mmol/L (ref 3.5–5.1)
Sodium: 137 mmol/L (ref 135–145)

## 2020-04-10 LAB — CULTURE, RESPIRATORY W GRAM STAIN: Culture: NORMAL

## 2020-04-10 LAB — MAGNESIUM: Magnesium: 2.2 mg/dL (ref 1.7–2.4)

## 2020-04-10 LAB — ACID FAST SMEAR (AFB, MYCOBACTERIA): Acid Fast Smear: NEGATIVE

## 2020-04-10 LAB — GLUCOSE, CAPILLARY
Glucose-Capillary: 121 mg/dL — ABNORMAL HIGH (ref 70–99)
Glucose-Capillary: 102 mg/dL — ABNORMAL HIGH (ref 70–99)
Glucose-Capillary: 107 mg/dL — ABNORMAL HIGH (ref 70–99)

## 2020-04-10 LAB — GLOMERULAR BASEMENT MEMBRANE ANTIBODIES: GBM Ab: 32 units — ABNORMAL HIGH (ref 0–20)

## 2020-04-10 MED ORDER — VITAMIN D3 25 MCG PO TABS: 1000.0000 [IU] | ORAL_TABLET | Freq: Every day | ORAL | 6 refills | Status: DC

## 2020-04-10 MED ORDER — CYCLOPHOSPHAMIDE 50 MG PO CAPS: 50.0000 mg | ORAL_CAPSULE | Freq: Every day | ORAL | 2 refills | Status: DC

## 2020-04-10 MED ORDER — OMEPRAZOLE 20 MG PO CPDR: 20.0000 mg | DELAYED_RELEASE_CAPSULE | Freq: Every day | ORAL | 11 refills | Status: DC

## 2020-04-10 MED ORDER — PREDNISONE 20 MG PO TABS
40.0000 mg | ORAL_TABLET | Freq: Every day | ORAL | 3 refills | Status: DC
Start: 2020-04-10 — End: 2020-05-03

## 2020-04-10 MED ORDER — CYCLOPHOSPHAMIDE 50 MG PO CAPS: 50.0000 mg | ORAL_CAPSULE | Freq: Every day | ORAL | 0 refills | Status: DC

## 2020-04-10 MED ORDER — SULFAMETHOXAZOLE-TRIMETHOPRIM 400-80 MG PO TABS: 1.0000 | ORAL_TABLET | ORAL | 6 refills | Status: DC

## 2020-04-10 NOTE — Progress Notes (Signed)
Physical Therapy Treatment Patient Details Name: Jasmine White MRN: [id number] DOB: Jul 27, 1946 Today's Date: 04/10/2020    History of Present Illness 74 y.o. female with medical history significant of chronic back pain, HTN, hypothyroidism, recently diagnosed CKD, HLD, Hx of COVID infection and recovery with persistent fatigue who presented 03/18/20 with nausea/vomiting x 9 days. Severe hypovolemic hyponatremia, AKI, UTI, HTN. 7/15 pt with thoracentesis, bronchoscopy and intubation.  Latest extubation 7/18    PT Comments    Continuing work on functional mobility and activity tolerance;  Ms. Mcferran continues to improve, able to discern that she would rather use an assistive device for safety with amb, which is appropriate -- she tends to lose her balance with head turns, especially when she does not have UE support; REcommend Rollator RW Excellent progress so far -- noted she is close to dc -- notified TOC RN of the need for Rollator  Follow Up Recommendations  Home health PT;Supervision/Assistance - 24 hour Recommend Outpt PT for gait and balance after HH course   Equipment Recommendations   (Rollator rW)    Recommendations for Other Services       Precautions / Restrictions Precautions Precautions: Fall Precaution Comments: Loss of balance with head turns without assistive device Restrictions Weight Bearing Restrictions: No    Mobility  Bed Mobility               General bed mobility comments: up in recliner  Transfers Overall transfer level: Modified independent Equipment used: None Transfers: Sit to/from Stand Sit to Stand: Modified independent (Device/Increase time) Stand pivot transfers: Modified independent (Device/Increase time)       General transfer comment: Modified Independent, no safety concerns  Ambulation/Gait Ambulation/Gait assistance: Supervision;Min assist Gait Distance (Feet): 400 Feet Assistive device: None;Rolling walker (2 wheeled) Gait  Pattern/deviations: Step-through pattern (erratic step width when not using RW) Gait velocity: decr   General Gait Details: Noteworthy better posture with RW; less steady without assistive device, and notable loss of balance requiring min assist to steady; tends to lose balance with head turns   Chief Strategy Officer    Modified Rankin (Stroke Patients Only)       Balance Overall balance assessment: Needs assistance Sitting-balance support: No upper extremity supported;Feet supported Sitting balance-Leahy Scale: Good     Standing balance support: During functional activity;No upper extremity supported Standing balance-Leahy Scale: Good                              Cognition Arousal/Alertness: Awake/alert Behavior During Therapy: WFL for tasks assessed/performed Overall Cognitive Status: Within Functional Limits for tasks assessed                                 General Comments: Was really happy to be able to step outside onto       Exercises      General Comments General comments (skin integrity, edema, etc.): We discussed usefullness of Rollator RW for balance, and just in case she is exhausted after HD      Pertinent Vitals/Pain Pain Assessment: No/denies pain    Home Living                      Prior Function            PT Goals (current  goals can now be found in the care plan section) Acute Rehab PT Goals Patient Stated Goal: go home Time For Goal Achievement: 04/16/20 (Majority of original PT goals met; Will continue to work on balance/perform standardized balance assessment and therex while here) Potential to Achieve Goals: Good Progress towards PT goals: Progressing toward goals    Frequency    Min 3X/week      PT Plan Current plan remains appropriate    Co-evaluation              AM-PAC PT "6 Clicks" Mobility   Outcome Measure  Help needed turning from your back to your  side while in a flat bed without using bedrails?: None Help needed moving from lying on your back to sitting on the side of a flat bed without using bedrails?: None Help needed moving to and from a bed to a chair (including a wheelchair)?: None Help needed standing up from a chair using your arms (e.g., wheelchair or bedside chair)?: None Help needed to walk in hospital room?: A Little Help needed climbing 3-5 steps with a railing? : A Little 6 Click Score: 22    End of Session   Activity Tolerance: Patient tolerated treatment well Patient left: in chair;with call bell/phone within reach Nurse Communication: Mobility status (Rec Rollator) PT Visit Diagnosis: Other abnormalities of gait and mobility (R26.89);Unsteadiness on feet (R26.81);Muscle weakness (generalized) (M62.81)     Time: 2878-6767 PT Time Calculation (min) (ACUTE ONLY): 25 min  Charges:  $Gait Training: 23-37 mins                     Roney [place], [place]  Acute Rehabilitation Services Pager [phone] Office [phone]    [place] 04/10/2020, 12:23 PM

## 2020-04-10 NOTE — Progress Notes (Signed)
Occupational Therapy Treatment Patient Details Name: Jasmine White MRN: [id number] DOB: Jul 19, 1946 Today's Date: 04/10/2020    History of present illness 74 y.o. female with medical history significant of chronic back pain, HTN, hypothyroidism, recently diagnosed CKD, HLD, Hx of COVID infection and recovery with persistent fatigue who presented 03/18/20 with nausea/vomiting x 9 days. Severe hypovolemic hyponatremia, AKI, UTI, HTN. 7/15 pt with thoracentesis, bronchoscopy and intubation.  Latest extubation 7/18   OT comments  Pt progressing well with OT goals, demonstrating Modified Independence to Supervision for ADLs and functional tasks assessed today. Extended time spent educating on energy conservation strategies to implement as dialysis is new for pt with pt active participant in discussion. Pt with good family support and motivated to discharge home today.   Follow Up Recommendations  Home health OT;Supervision - Intermittent    Equipment Recommendations  None recommended by OT    Recommendations for Other Services      Precautions / Restrictions Precautions Precautions: Fall Restrictions Weight Bearing Restrictions: No       Mobility Bed Mobility               General bed mobility comments: up in recliner  Transfers Overall transfer level: Modified independent Equipment used: Rolling walker (2 wheeled) Transfers: Sit to/from [facility] Sit to Stand: Modified independent (Device/Increase time) Stand pivot transfers: Modified independent (Device/Increase time)       General transfer comment: Modified Independent, no safety concerns    Balance Overall balance assessment: Needs assistance Sitting-balance support: No upper extremity supported;Feet supported Sitting balance-Leahy Scale: Good     Standing balance support: During functional activity;No upper extremity supported Standing balance-Leahy Scale: Good                              ADL either performed or assessed with clinical judgement   ADL Overall ADL's : Needs assistance/impaired                     Lower Body Dressing: Sit to/from stand;Set up Lower Body Dressing Details (indicate cue type and reason): Setup, able to demonstrate ability to reach B feet for LB dressing without difficulty             Functional mobility during ADLs: Modified independent;Rolling walker General ADL Comments: Pt with improving functional abilities, reports going to bathroom without assistance yesterday. Pt with mild endurance and strength deficits but requires no more than supervision for BADLs     Vision   Vision Assessment?: No apparent visual deficits   Perception     Praxis      Cognition Arousal/Alertness: Awake/alert Behavior During Therapy: WFL for tasks assessed/performed Overall Cognitive Status: Within Functional Limits for tasks assessed                                          Exercises     Shoulder Instructions       General Comments Extended time spent discussing home setup, family support and education on energy conservation strategies as dialysis is new to pt. Pt verbalizing understanding of all education.     Pertinent Vitals/ Pain       Pain Assessment: No/denies pain  Home Living  Prior Functioning/Environment              Frequency  Min 2X/week        Progress Toward Goals  OT Goals(current goals can now be found in the care plan section)  Progress towards OT goals: Progressing toward goals  Acute Rehab OT Goals Patient Stated Goal: go home OT Goal Formulation: With patient Time For Goal Achievement: 04/17/20 Potential to Achieve Goals: Good ADL Goals Pt Will Perform Grooming: with modified independence;standing Pt Will Perform Lower Body Bathing: with modified independence;sit to/from stand Pt Will Transfer to Toilet: with  modified independence;ambulating;regular height toilet Additional ADL Goal #1: Patient will increase activity tolerance to 10 min standing ADLs. Additional ADL Goal #2: Patient will independently verbalize 3 fall prevention strategies.  Plan Discharge plan remains appropriate    Co-evaluation                 AM-PAC OT "6 Clicks" Daily Activity     Outcome Measure   Help from another person eating meals?: None Help from another person taking care of personal grooming?: None Help from another person toileting, which includes using toliet, bedpan, or urinal?: A Little Help from another person bathing (including washing, rinsing, drying)?: A Little Help from another person to put on and taking off regular upper body clothing?: None Help from another person to put on and taking off regular lower body clothing?: A Little 6 Click Score: 21    End of Session Equipment Utilized During Treatment: Rolling walker  OT Visit Diagnosis: Unsteadiness on feet (R26.81);Muscle weakness (generalized) (M62.81);Other symptoms and signs involving cognitive function   Activity Tolerance Patient tolerated treatment well   Patient Left in chair;with call bell/phone within reach   Nurse Communication Mobility status        Time: 7282-0601 OT Time Calculation (min): 17 min  Charges: OT General Charges $OT Visit: 1 Visit OT Treatments $Therapeutic Activity: 8-22 mins  Layla Maw, OTR/L   Layla Maw 04/10/2020, 11:46 AM

## 2020-04-10 NOTE — TOC Transition Note (Signed)
Transition of Care [facility]) - CM/SW Discharge Note   Patient Details  Name: Jasmine White MRN: [id number] Date of Birth: 1946-07-08  [facility]) CM/SW Contact:  Bartholomew Crews, RN Phone Number: [phone] 04/10/2020, 12:33 PM   Clinical Narrative:     Patient to transition home today. Notified that cytoxan could not be quickly filled by [place]. Spoke with staff at [facility] to ensure medication was in stock. Notified MD to send prescription to different pharmacy, and verified with [facility] that prescription received. Notified daughter of where prescription was being filled. She is in agreement and can pick up. Notified by PT of recommendations for rollator. DME order placed, and referral sent to [facility] for delivery to room. No further TOC needs identified.   Final next level of care: [place] Barriers to Discharge: No Barriers Identified   Patient Goals and CMS Choice Patient states their goals for this hospitalization and ongoing recovery are:: to go home CMS Medicare.gov Compare Post Acute Care list provided to:: Patient Choice offered to / list presented to : Patient  Discharge Placement                       Discharge Plan and Services     Post Acute Care Choice: Home Health          DME Arranged: Walker rolling with seat DME Agency: [facility] Date DME Agency Contacted: 04/10/20 Time DME Agency Contacted: 1232 Representative spoke with at DME Agency: [place]: PT [place]: [place] Date [place]: 04/10/20 Time [place]: 1233 Representative spoke with at [place]: [place] ([place]) Interventions     Readmission Risk Interventions No flowsheet data found.

## 2020-04-10 NOTE — Progress Notes (Signed)
[name] to be discharged Home per MD order. Discussed prescriptions and follow up appointments with the patient. Prescriptions and  medication list explained in detail. Patient verbalized understanding.  Skin clean, dry and intact without evidence of skin break down, no evidence of skin tears noted. IV catheter discontinued intact. Site without signs and symptoms of complications. Dressing and pressure applied. Pt denies pain at the site currently. No complaints noted.  Patient free of lines, drains, and wounds.   An After Visit Summary (AVS) was printed and given to the patient. Patient escorted via wheelchair, and discharged home via private auto.  [name], RN

## 2020-04-10 NOTE — Discharge Summary (Signed)
Physician Discharge Summary         Patient ID: Jasmine White MRN: [id number] DOB/AGE: [birth year] 74 y.o.  Admit date: 03/17/2020 Discharge date: 04/10/2020  Discharge Diagnoses:    Acute Hypoxic respiratory failure Pulmonary Vasculitis  Acute renal failure 2/2 ANCA+, anti-MPO+ and anti-GBM+ vasculitis HyperGlycemia  Anemia  Discharge summary    74 yo female presented to ER with nausea and vomiting in setting of hypovolemic hyponatremia, UTI and new onset renal failure.  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  Started on steroids, cytoxan, plasmapheresis.  Developed progressive dyspnea and hypoxia with b/l GGO on CT chest.  Developed progressive hypoxia and required intubation. Hospital course: 6/27 admitted 6/29 Rt IJ HD catheter by IR, start solumedrol 6/30 start plasmapheresis 7/01 Lt renal biopsy >> nearly 100% crescents with significant ATN 7/02 change to prednisone, start cytoxan 7/03 short of breath 7/12 change prednisone to 40 mg daily; Lt brachiocephalic AV fistula created 7/15 worsening hypoxia, VDRF, change back to solumedrol 7/17 complete PLEX (112 total) 7/18 extubated.  7/19 room air, doing well to transfer to [facility]  7/20 Ready for dc. To go home on Cytoxan and slow pred taper (73m over 6 months).   Discharge Plan by Active Problems   AKI 2/2 vasculitis- ANCA+, anti-MPO+, anti-GBM + Plan Will follow up w/ nephrology for HD MWF Home on pred taper (start 471mday; taper over 6 months) Nephrology will taper as out pt Home on Cytoxan current dosing (to be managed by nephrology) Home on Bactrim MWF for PCP prophylaxis  Anemia 2/2 renal disease Plan Cont Darbe w/ HD  Hypertension Plan Cont metoprolol   Physical deconditioning Plan Home w/ home health PT   Hypothyroidism  [facility]   ANCA 6/27 >> MPO 10.3 (nl 0 - 9)  GBM Ab  6/27 >> 137 (nl 0 - 20)  ANA 6/27 >> positive, ds DNA Ab 50 (nl < 5)  CT abd/pelvis 03/17/20 >> inspissated fecal material, gallstones  V/Q scan 7/01 >> negative  CT chest 7/03 >> mosaic areas of GGO and consolidation, mod b/l effusions  GMB Ab 7/13 >> 55  CT chest 7/15 >> large b/l effusions with compressive ATX, extensive ASD  Rt thoracentesis 7/15 >> 900 cc straw colored fluid, glucose 133, protein < 3, LDH 50, WBC 63 (72%M)  Bronchoscopy 7/15 >> WBC 125 (68%N)  Echo 7/16 >>  EF 55 to 60%, grade 1 DD, large Lt effusion, mod MR, mod/severe AR  GMB Ab 7/18 >> 32  Procedures   6/29 IR tunneled HD cath 7/15 ETT > 7/18 Culture data/antimicrobials   COVID 6/26 >> negative MRSA PCR 6/26 >> negative PCP BAL 7/15 >> (pendtng at time of dc 7/20) AFB BAL 7/15 >> (pending at time of dc 7/20) Fungal BAL 7/15 >>(pending at time of dc 7/20) BAL 7/15 >>neg Blood 7/15 >> neg Rt pleural fluid 7/15 >> neg   Consults  Pulmonary critical care Nephrology   Discharge Exam: Blood Pressure (Abnormal) 157/75 (BP Location: Right Arm)   Pulse 79   Temperature 97.8 F (36.6 C) (Oral)   Respiration 18   Height _0  (1.676 m)   Weight 61.5 kg   Oxygen Saturation 99%   Body Mass Index 21.88 kg/m   General this is a pleasant [age]ear old female resting in bed not in distress HENT NCAT no JVD pulm clear decreased bases Card rrr abd not tender  Ext warm and dry  Neuro awake and oriented.  Labs at discharge   Lab Results  Component Value Date   CREATININE 3.65 (H) 04/10/2020   BUN 35 (H) 04/10/2020   NA 137 04/10/2020   K 3.7 04/10/2020   CL 98 04/10/2020   CO2 29 04/10/2020   Lab Results  Component Value Date   WBC 7.1 04/10/2020   HGB 7.7 (L) 04/10/2020   HCT 24.3 (L) 04/10/2020   MCV 95.3 04/10/2020   PLT 239 04/10/2020   Lab Results  Component Value Date   ALT 14 03/22/2020   AST 23 03/22/2020   ALKPHOS 52 03/22/2020   BILITOT 0.7 03/22/2020   Lab Results    Component Value Date   INR 1.0 04/02/2020   INR 1.2 03/22/2020    Current radiological studies    No results found.  Disposition:    Discharge disposition: 01-Home or Self Care       Discharge Instructions    Diet - low sodium heart healthy   Complete by: As directed    Increase activity slowly   Complete by: As directed    No wound care   Complete by: As directed       Allergies as of 04/10/2020    Allergen Reactions Comment   Hctz [hydrochlorothiazide] Other (See Comments) Hx of severe Hyponatremia while taking HCTZ - now contraindicated   Percocet [oxycodone-acetaminophen] Nausea Only    Ultram [tramadol]  Makes her crazy and nauseated      Medication List    Stop taking these medications   FISH OIL PO   GARLIC PO   losartan-hydrochlorothiazide 100-12.5 MG tablet Commonly known as: HYZAAR   LUTEIN PO   multivitamin with minerals Tabs tablet   QUNOL ULTRA COQ10 PO     Take these medications   amitriptyline 50 MG tablet Commonly known as: ELAVIL Take 1 tablet (50 mg total) by mouth at bedtime.   cyclophosphamide 50 MG capsule Commonly known as: CYTOXAN Take 1 capsule (50 mg total) by mouth daily at 6 PM. Take with food to minimize GI upset. Take early in the day and maintain hydration.   HYDROcodone-acetaminophen 5-325 MG tablet Commonly known as: NORCO/VICODIN Take 1 tablet by mouth every 6 (six) hours as needed for moderate pain.   metoprolol succinate 100 MG 24 hr tablet Commonly known as: TOPROL-XL Take 1 tablet (100 mg total) by mouth daily. Take with or immediately following a meal.   omeprazole 20 MG capsule Commonly known as: PriLOSEC Take 1 capsule (20 mg total) by mouth daily.   predniSONE 20 MG tablet Commonly known as: DELTASONE Take 2 tablets (40 mg total) by mouth daily.   sulfamethoxazole-trimethoprim 400-80 MG tablet Commonly known as: BACTRIM Take 1 tablet by mouth every Monday, Wednesday, and Friday. Start taking on:  April 11, 2020   Synthroid 88 MCG tablet Generic drug: levothyroxine TAKE 1 TABLET BY MOUTH ONCE DAILY BEFORE BREAKFAST What changed:   how much to take  how to take this  when to take this  additional instructions   Vitamin D3 25 MCG tablet Commonly known as: Vitamin D Take 1 tablet (1,000 Units total) by mouth daily. Start taking on: April 11, 2020        Follow-up appointment   Nephrology will call  Discharge Condition:    good  Physician Statement:   The Patient was personally examined, the discharge assessment and plan has been personally reviewed and I agree with ACNP Chisom Aust's  assessment and plan. 35 minutes of time have been dedicated to discharge assessment, planning and discharge instructions.   Signed: Clementeen Graham 04/10/2020, 10:35 AM

## 2020-04-10 NOTE — Progress Notes (Signed)
Patient ID: Jasmine White, female   DOB: 15-Mar-1946, 74 y.o.   MRN: [id number] S:   No complaints, still feels great, doing PT, in good spirits. Denies chest pain, sob, left arm pain/numbness.  O:BP (!) 157/75 (BP Location: Right Arm)   Pulse 79   Temp 97.8 F (36.6 C) (Oral)   Resp 18   Ht '5\' 6"'  (1.676 m)   Wt 61.5 kg   SpO2 99%   BMI 21.88 kg/m   Intake/Output Summary (Last 24 hours) at 04/10/2020 0956 Last data filed at 04/10/2020 6063 Gross per 24 hour  Intake 120 ml  Output 2500 ml  Net -2380 ml   Intake/Output: I/O last 3 completed shifts: In: 720 [P.O.:720] Out: 2500 [Other:2500]  Intake/Output this shift:  No intake/output data recorded. Weight change: -2.07 kg Gen: nad, walking around in hallway Skin: R IJ TDC C/D/I CVS: reg rate Resp: normal WOB Abd:+BS, soft, nt/nd Ext: no edema, LUE AVF incision c/d/i with faint bruit and thrill Neuro:speech clear and coherent, gait normal with assistance of walker  Recent Labs  Lab 04/04/20 0827 04/04/20 0827 04/05/20 0456 04/05/20 2034 04/06/20 0701 04/07/20 0240 04/08/20 0359 04/09/20 0316 04/10/20 0625  NA 139   < > 136 137 138 136 137 132* 137  K 4.0   < > 3.9 5.0 5.1 4.4 4.6 4.4 3.7  CL 97*  --  98  --  96* 95* 92* 89* 98  CO2 29  --  26  --  '30 29 30 29 29  ' GLUCOSE 152*  --  109*  --  103* 117* 154* 177* 129*  BUN 41*  --  19  --  32* 26* 59* 79* 35*  CREATININE 5.10*  --  2.99*  --  4.31* 2.65* 3.99* 5.69* 3.65*  ALBUMIN 2.9*  --  3.0*  --  3.0* 3.1* 3.0* 2.9* 2.9*  CALCIUM 8.0*  --  8.0*  --  8.2* 8.5* 8.5* 8.0* 8.3*  PHOS 4.4  --  3.2  --  5.8* 5.2* 5.6*  5.7* 5.9*  5.9* 4.6   < > = values in this interval not displayed.   Liver Function Tests: Recent Labs  Lab 04/05/20 1353 04/06/20 0701 04/08/20 0359 04/09/20 0316 04/10/20 0625  PROT 5.6*  --   --   --   --   ALBUMIN  --    < > 3.0* 2.9* 2.9*   < > = values in this interval not displayed.   No results for input(s): LIPASE, AMYLASE in the  last 168 hours. No results for input(s): AMMONIA in the last 168 hours. CBC: Recent Labs  Lab 04/05/20 1353 04/05/20 2034 04/06/20 0701 04/06/20 0701 04/07/20 0240 04/07/20 0240 04/08/20 0359 04/09/20 0316 04/10/20 0625  WBC 9.3  --  7.8   < > 10.8*   < > 8.2 9.0 7.1  NEUTROABS 8.6*  --   --   --   --   --   --   --   --   HGB 8.6*   < > 7.7*   < > 8.7*   < > 8.4* 7.8* 7.7*  HCT 27.5*   < > 25.0*   < > 27.9*   < > 26.2* 24.1* 24.3*  MCV 92.3  --  93.3  --  93.3  --  93.9 92.7 95.3  PLT 154  --  154   < > 240   < > 175 218 239   < > = values  in this interval not displayed.   Cardiac Enzymes: No results for input(s): CKTOTAL, CKMB, CKMBINDEX, TROPONINI in the last 168 hours. CBG: Recent Labs  Lab 04/09/20 0729 04/09/20 1120 04/09/20 1746 04/09/20 2138 04/10/20 0639  GLUCAP 83 173* 96 114* 121*    Iron Studies: No results for input(s): IRON, TIBC, TRANSFERRIN, FERRITIN in the last 72 hours. Studies/Results: No results found. . (feeding supplement) PROSource Plus  30 mL Oral BID BM  . chlorhexidine gluconate (MEDLINE KIT)  15 mL Mouth Rinse BID  . Chlorhexidine Gluconate Cloth  6 each Topical Q0600  . cholecalciferol  1,000 Units Oral Daily  . cyclophosphamide  50 mg Oral q1800  . darbepoetin (ARANESP) injection - DIALYSIS  100 mcg Intravenous Q Fri-HD  . feeding supplement (NEPRO CARB STEADY)  237 mL Oral BID BM  . heparin injection (subcutaneous)  5,000 Units Subcutaneous Q8H  . insulin aspart  0-15 Units Subcutaneous TID WC  . insulin aspart  0-5 Units Subcutaneous QHS  . levothyroxine  88 mcg Oral Q0600  . methylPREDNISolone (SOLU-MEDROL) injection  40 mg Intravenous Q12H  . metoprolol tartrate  100 mg Oral BID  . pantoprazole  40 mg Oral Daily  . sodium chloride flush  10-40 mL Intracatheter Q12H  . sulfamethoxazole-trimethoprim  1 tablet Oral Q M,W,F    BMET    Component Value Date/Time   NA 137 04/10/2020 0625   NA 128 (L) 03/01/2020 0927   K 3.7  04/10/2020 0625   CL 98 04/10/2020 0625   CO2 29 04/10/2020 0625   GLUCOSE 129 (H) 04/10/2020 0625   BUN 35 (H) 04/10/2020 0625   BUN 67 (H) 03/01/2020 0927   CREATININE 3.65 (H) 04/10/2020 0625   CREATININE 0.84 12/21/2012 1552   CALCIUM 8.3 (L) 04/10/2020 0625   GFRNONAA 12 (L) 04/10/2020 0625   GFRNONAA 72 12/21/2012 1552   GFRAA 13 (L) 04/10/2020 0625   GFRAA 83 12/21/2012 1552   CBC    Component Value Date/Time   WBC 7.1 04/10/2020 0625   RBC 2.55 (L) 04/10/2020 0625   HGB 7.7 (L) 04/10/2020 0625   HGB 9.7 (L) 03/01/2020 0927   HCT 24.3 (L) 04/10/2020 0625   HCT 28.8 (L) 03/01/2020 0927   PLT 239 04/10/2020 0625   PLT 397 03/01/2020 0927   MCV 95.3 04/10/2020 0625   MCV 79 03/01/2020 0927   MCH 30.2 04/10/2020 0625   MCHC 31.7 04/10/2020 0625   RDW 18.5 (H) 04/10/2020 0625   RDW 14.7 03/01/2020 0927   LYMPHSABS 0.3 (L) 04/05/2020 1353   LYMPHSABS 1.3 03/01/2020 0927   MONOABS 0.3 04/05/2020 1353   EOSABS 0.0 04/05/2020 1353   EOSABS 0.2 03/01/2020 0927   BASOSABS 0.0 04/05/2020 1353   BASOSABS 0.1 03/01/2020 0927   Results for ZONYA, GUDGER (MRN [id number]) as of 04/08/2020 10:37  Ref. Range 03/18/2020 09:54 04/03/2020 04:54  GBM Ab Latest Ref Range: 0 - 20 units 137 (H) 55 (H)   Assessment/Plan:  1. Hypoxia:  Bronch with concern for alveolitis / [place]; also concern for aspiration. resolved  1. Finished 3 additional TPE treatments 7/17. Hold on further tpe. 2. Dialysis dependent (anuric),AKIdue tp RPGN from anti-GBM disease as well as +ANCA, ANA, dsDNA and MPO. completed daily plasmapheresis and IV solumedrol now PO cytoxan and pred 40.  1. Biopsy with 100% crescents and significant amount of ATN. 2. [place] continue with HD on MWF schedule for now.  HD tomorrow if still here: 2K, 3.5h, 2-3L UF,  no heparin, TDC 3. She completed2 weeks of PLEX treatments (11 total over 14d); resent GBM Ab 7/13, still elevated and in light of hypoxia S/P PLEX x 3 more cycles,  concluding 7/17. Rpt Anti GBM improved from 55 (7/13) to 32 (7/18) 4. Continue cytoxan 50 mg daily for now. 5. Change solumedrol  to prednisone 72m daily. Plan will be to taper over 6 months or even sooner given initial biopsy findings 6. Bactrim MWF for PCP prophylaxis 7. protonix for gi ppx 8. Continue to follow for renal recovery, however doubtful given extensive crescents on biopsy.  [facility] Rpt GBM ab as an outpatient 11. Okay from nephrology perspective for discharge, will be followed at the dialysis unit 3. HTN- BP stable.  On MTP only 4. Vascular access- RIJ TDC placed 03/20/20 by IR.   VVS placed BC AVF 04/02/20. 5. Thrombocytopenia - resolved, CTM 6. Anemia of chronic disease. aranesp 1032m qfriday 7. [facility]  [facility]

## 2020-04-10 NOTE — Progress Notes (Signed)
Navigator confirmed with Nephrologist and Attending that patient is set up for OP HD at [facility] and can start tomorrow if discharged today. Renal Navigator received call from patient's daughter who states patient called to say she was being discharged today. Navigator confirmed that we are in the process of making plans and discussing discharge today. Navigator informed patient's daughter that patient is all set to start in the OP HD clinic tomorrow if discharged today and thanked her for calling to confirm this. Patient's daughter was appreciative of information and support given to her mother throughout this hospitalization. Patient needs to arrive tomorrow at [facility] at 11:30am for her first treatment. Patient's daughter states she will be the one to take her the first day.  Navigator notified Renal PA to request orders to be sent to clinic and notified clinic of patient's start tomorrow.  Alphonzo Cruise, [place] Renal Navigator [phone]

## 2020-04-11 ENCOUNTER — Telehealth: Payer: Self-pay | Admitting: Nephrology

## 2020-04-11 DIAGNOSIS — T7840XA Allergy, unspecified, initial encounter: Secondary | ICD-10-CM | POA: Insufficient documentation

## 2020-04-11 DIAGNOSIS — Z992 Dependence on renal dialysis: Secondary | ICD-10-CM | POA: Diagnosis not present

## 2020-04-11 DIAGNOSIS — T782XXA Anaphylactic shock, unspecified, initial encounter: Secondary | ICD-10-CM | POA: Insufficient documentation

## 2020-04-11 DIAGNOSIS — N179 Acute kidney failure, unspecified: Secondary | ICD-10-CM | POA: Diagnosis not present

## 2020-04-11 DIAGNOSIS — N2581 Secondary hyperparathyroidism of renal origin: Secondary | ICD-10-CM | POA: Diagnosis not present

## 2020-04-11 MED FILL — CYCLOPHOSPHAMIDE 50 MG CAPS: 50 | 30 days supply | Qty: 30 | Fill #0

## 2020-04-11 NOTE — Telephone Encounter (Signed)
Transition of care contact from inpatient facility  Date of discharge: 04/10/20 Date of contact:  04/11/20 Method: Phone Spoke to: Patient's daughter [name].   Patient contacted to discuss transition of care from recent inpatient hospitalization. Patient was admitted to [facility] from 6/26-7/20/21 with discharge diagnosis of dialysis dependent AKI.   Medication changes were reviewed.  Patient will follow up with his/her outpatient HD unit on: She went to her first dialysis today 7/21.

## 2020-04-12 DIAGNOSIS — N390 Urinary tract infection, site not specified: Secondary | ICD-10-CM | POA: Diagnosis not present

## 2020-04-12 DIAGNOSIS — G8929 Other chronic pain: Secondary | ICD-10-CM | POA: Diagnosis not present

## 2020-04-12 DIAGNOSIS — N186 End stage renal disease: Secondary | ICD-10-CM | POA: Diagnosis not present

## 2020-04-12 DIAGNOSIS — J9601 Acute respiratory failure with hypoxia: Secondary | ICD-10-CM | POA: Diagnosis not present

## 2020-04-12 DIAGNOSIS — M47816 Spondylosis without myelopathy or radiculopathy, lumbar region: Secondary | ICD-10-CM | POA: Diagnosis not present

## 2020-04-12 DIAGNOSIS — N179 Acute kidney failure, unspecified: Secondary | ICD-10-CM | POA: Diagnosis not present

## 2020-04-12 DIAGNOSIS — I288 Other diseases of pulmonary vessels: Secondary | ICD-10-CM | POA: Diagnosis not present

## 2020-04-12 DIAGNOSIS — D631 Anemia in chronic kidney disease: Secondary | ICD-10-CM | POA: Diagnosis not present

## 2020-04-12 DIAGNOSIS — I120 Hypertensive chronic kidney disease with stage 5 chronic kidney disease or end stage renal disease: Secondary | ICD-10-CM | POA: Diagnosis not present

## 2020-04-12 LAB — PATHOLOGIST SMEAR REVIEW

## 2020-04-13 DIAGNOSIS — Z992 Dependence on renal dialysis: Secondary | ICD-10-CM | POA: Diagnosis not present

## 2020-04-13 DIAGNOSIS — J9601 Acute respiratory failure with hypoxia: Secondary | ICD-10-CM | POA: Diagnosis not present

## 2020-04-13 DIAGNOSIS — N179 Acute kidney failure, unspecified: Secondary | ICD-10-CM | POA: Diagnosis not present

## 2020-04-13 DIAGNOSIS — M301 Polyarteritis with lung involvement [Churg-Strauss]: Secondary | ICD-10-CM | POA: Diagnosis not present

## 2020-04-13 DIAGNOSIS — N186 End stage renal disease: Secondary | ICD-10-CM | POA: Diagnosis not present

## 2020-04-13 DIAGNOSIS — N2581 Secondary hyperparathyroidism of renal origin: Secondary | ICD-10-CM | POA: Diagnosis not present

## 2020-04-16 ENCOUNTER — Inpatient Hospital Stay (HOSPITAL_COMMUNITY)
Admission: EM | Admit: 2020-04-16 | Discharge: 2020-05-03 | DRG: 545 | Disposition: A | Discharge status: Home Health | Payer: Medicare PPO | Attending: Internal Medicine | Admitting: Internal Medicine

## 2020-04-16 ENCOUNTER — Emergency Department (HOSPITAL_COMMUNITY): Payer: Medicare PPO

## 2020-04-16 ENCOUNTER — Ambulatory Visit: Payer: Medicare PPO | Admitting: *Deleted

## 2020-04-16 DIAGNOSIS — E871 Hypo-osmolality and hyponatremia: Secondary | ICD-10-CM | POA: Diagnosis present

## 2020-04-16 DIAGNOSIS — E8770 Fluid overload, unspecified: Secondary | ICD-10-CM | POA: Diagnosis not present

## 2020-04-16 DIAGNOSIS — Z8261 Family history of arthritis: Secondary | ICD-10-CM

## 2020-04-16 DIAGNOSIS — Z8249 Family history of ischemic heart disease and other diseases of the circulatory system: Secondary | ICD-10-CM

## 2020-04-16 DIAGNOSIS — J189 Pneumonia, unspecified organism: Secondary | ICD-10-CM

## 2020-04-16 DIAGNOSIS — I776 Arteritis, unspecified: Secondary | ICD-10-CM

## 2020-04-16 DIAGNOSIS — Y95 Nosocomial condition: Secondary | ICD-10-CM | POA: Diagnosis not present

## 2020-04-16 DIAGNOSIS — R079 Chest pain, unspecified: Secondary | ICD-10-CM | POA: Diagnosis not present

## 2020-04-16 DIAGNOSIS — M549 Dorsalgia, unspecified: Secondary | ICD-10-CM | POA: Diagnosis present

## 2020-04-16 DIAGNOSIS — Z811 Family history of alcohol abuse and dependence: Secondary | ICD-10-CM

## 2020-04-16 DIAGNOSIS — B379 Candidiasis, unspecified: Secondary | ICD-10-CM | POA: Diagnosis not present

## 2020-04-16 DIAGNOSIS — T380X5A Adverse effect of glucocorticoids and synthetic analogues, initial encounter: Secondary | ICD-10-CM | POA: Diagnosis not present

## 2020-04-16 DIAGNOSIS — Y92230 Patient room in hospital as the place of occurrence of the external cause: Secondary | ICD-10-CM | POA: Diagnosis not present

## 2020-04-16 DIAGNOSIS — G4700 Insomnia, unspecified: Secondary | ICD-10-CM | POA: Diagnosis present

## 2020-04-16 DIAGNOSIS — N170 Acute kidney failure with tubular necrosis: Secondary | ICD-10-CM | POA: Diagnosis not present

## 2020-04-16 DIAGNOSIS — N179 Acute kidney failure, unspecified: Secondary | ICD-10-CM | POA: Diagnosis not present

## 2020-04-16 DIAGNOSIS — I132 Hypertensive heart and chronic kidney disease with heart failure and with stage 5 chronic kidney disease, or end stage renal disease: Secondary | ICD-10-CM | POA: Diagnosis present

## 2020-04-16 DIAGNOSIS — R042 Hemoptysis: Secondary | ICD-10-CM | POA: Diagnosis not present

## 2020-04-16 DIAGNOSIS — Z8616 Personal history of COVID-19: Secondary | ICD-10-CM | POA: Diagnosis not present

## 2020-04-16 DIAGNOSIS — R0902 Hypoxemia: Secondary | ICD-10-CM

## 2020-04-16 DIAGNOSIS — J90 Pleural effusion, not elsewhere classified: Secondary | ICD-10-CM | POA: Diagnosis not present

## 2020-04-16 DIAGNOSIS — I288 Other diseases of pulmonary vessels: Secondary | ICD-10-CM | POA: Diagnosis present

## 2020-04-16 DIAGNOSIS — N019 Rapidly progressive nephritic syndrome with unspecified morphologic changes: Secondary | ICD-10-CM | POA: Diagnosis not present

## 2020-04-16 DIAGNOSIS — M310 Hypersensitivity angiitis: Principal | ICD-10-CM | POA: Diagnosis present

## 2020-04-16 DIAGNOSIS — E876 Hypokalemia: Secondary | ICD-10-CM | POA: Diagnosis present

## 2020-04-16 DIAGNOSIS — Z85828 Personal history of other malignant neoplasm of skin: Secondary | ICD-10-CM

## 2020-04-16 DIAGNOSIS — M545 Low back pain: Secondary | ICD-10-CM | POA: Diagnosis not present

## 2020-04-16 DIAGNOSIS — Z992 Dependence on renal dialysis: Secondary | ICD-10-CM | POA: Diagnosis not present

## 2020-04-16 DIAGNOSIS — E785 Hyperlipidemia, unspecified: Secondary | ICD-10-CM | POA: Diagnosis present

## 2020-04-16 DIAGNOSIS — Z79890 Hormone replacement therapy (postmenopausal): Secondary | ICD-10-CM

## 2020-04-16 DIAGNOSIS — N19 Unspecified kidney failure: Secondary | ICD-10-CM | POA: Diagnosis present

## 2020-04-16 DIAGNOSIS — Z96653 Presence of artificial knee joint, bilateral: Secondary | ICD-10-CM | POA: Diagnosis present

## 2020-04-16 DIAGNOSIS — I10 Essential (primary) hypertension: Secondary | ICD-10-CM

## 2020-04-16 DIAGNOSIS — J810 Acute pulmonary edema: Secondary | ICD-10-CM | POA: Diagnosis not present

## 2020-04-16 DIAGNOSIS — R0600 Dyspnea, unspecified: Secondary | ICD-10-CM | POA: Diagnosis not present

## 2020-04-16 DIAGNOSIS — J9601 Acute respiratory failure with hypoxia: Secondary | ICD-10-CM

## 2020-04-16 DIAGNOSIS — Z7952 Long term (current) use of systemic steroids: Secondary | ICD-10-CM

## 2020-04-16 DIAGNOSIS — N186 End stage renal disease: Secondary | ICD-10-CM | POA: Diagnosis not present

## 2020-04-16 DIAGNOSIS — E039 Hypothyroidism, unspecified: Secondary | ICD-10-CM

## 2020-04-16 DIAGNOSIS — Z885 Allergy status to narcotic agent status: Secondary | ICD-10-CM

## 2020-04-16 DIAGNOSIS — R739 Hyperglycemia, unspecified: Secondary | ICD-10-CM | POA: Diagnosis not present

## 2020-04-16 DIAGNOSIS — J984 Other disorders of lung: Secondary | ICD-10-CM | POA: Diagnosis not present

## 2020-04-16 DIAGNOSIS — M47816 Spondylosis without myelopathy or radiculopathy, lumbar region: Secondary | ICD-10-CM | POA: Diagnosis not present

## 2020-04-16 DIAGNOSIS — G8929 Other chronic pain: Secondary | ICD-10-CM

## 2020-04-16 DIAGNOSIS — N390 Urinary tract infection, site not specified: Secondary | ICD-10-CM | POA: Diagnosis not present

## 2020-04-16 DIAGNOSIS — E46 Unspecified protein-calorie malnutrition: Secondary | ICD-10-CM | POA: Insufficient documentation

## 2020-04-16 DIAGNOSIS — D631 Anemia in chronic kidney disease: Secondary | ICD-10-CM | POA: Diagnosis not present

## 2020-04-16 DIAGNOSIS — Z888 Allergy status to other drugs, medicaments and biological substances status: Secondary | ICD-10-CM

## 2020-04-16 DIAGNOSIS — I7789 Other specified disorders of arteries and arterioles: Secondary | ICD-10-CM | POA: Diagnosis present

## 2020-04-16 DIAGNOSIS — Z823 Family history of stroke: Secondary | ICD-10-CM

## 2020-04-16 DIAGNOSIS — N2581 Secondary hyperparathyroidism of renal origin: Secondary | ICD-10-CM | POA: Diagnosis not present

## 2020-04-16 DIAGNOSIS — N189 Chronic kidney disease, unspecified: Secondary | ICD-10-CM | POA: Diagnosis not present

## 2020-04-16 DIAGNOSIS — I120 Hypertensive chronic kidney disease with stage 5 chronic kidney disease or end stage renal disease: Secondary | ICD-10-CM | POA: Diagnosis not present

## 2020-04-16 DIAGNOSIS — Z8042 Family history of malignant neoplasm of prostate: Secondary | ICD-10-CM

## 2020-04-16 DIAGNOSIS — Z79899 Other long term (current) drug therapy: Secondary | ICD-10-CM

## 2020-04-16 DIAGNOSIS — R0602 Shortness of breath: Secondary | ICD-10-CM | POA: Diagnosis not present

## 2020-04-16 DIAGNOSIS — I7782 Antineutrophilic cytoplasmic antibody (ANCA) vasculitis: Secondary | ICD-10-CM

## 2020-04-16 LAB — COMPREHENSIVE METABOLIC PANEL
ALT: 15 U/L (ref 0–44)
AST: 26 U/L (ref 15–41)
Albumin: 3 g/dL — ABNORMAL LOW (ref 3.5–5.0)
Alkaline Phosphatase: 55 U/L (ref 38–126)
Anion gap: 16 — ABNORMAL HIGH (ref 5–15)
BUN: 74 mg/dL — ABNORMAL HIGH (ref 8–23)
CO2: 25 mmol/L (ref 22–32)
Calcium: 7.9 mg/dL — ABNORMAL LOW (ref 8.9–10.3)
Chloride: 90 mmol/L — ABNORMAL LOW (ref 98–111)
Creatinine, Ser: 7.23 mg/dL — ABNORMAL HIGH (ref 0.44–1.00)
GFR calc Af Amer: 6 mL/min — ABNORMAL LOW (ref >60)
GFR calc non Af Amer: 5 mL/min — ABNORMAL LOW (ref >60)
Glucose, Bld: 109 mg/dL — ABNORMAL HIGH (ref 70–99)
Potassium: 3.3 mmol/L — ABNORMAL LOW (ref 3.5–5.1)
Sodium: 131 mmol/L — ABNORMAL LOW (ref 135–145)
Total Bilirubin: 0.6 mg/dL (ref 0.3–1.2)
Total Protein: 5.8 g/dL — ABNORMAL LOW (ref 6.5–8.1)

## 2020-04-16 LAB — CBC
HCT: 26 % — ABNORMAL LOW (ref 36.0–46.0)
Hemoglobin: 8.4 g/dL — ABNORMAL LOW (ref 12.0–15.0)
MCH: 30.5 pg (ref 26.0–34.0)
MCHC: 32.3 g/dL (ref 30.0–36.0)
MCV: 94.5 fL (ref 80.0–100.0)
Platelets: 197 10*3/uL (ref 150–400)
RBC: 2.75 MIL/uL — ABNORMAL LOW (ref 3.87–5.11)
RDW: 19.5 % — ABNORMAL HIGH (ref 11.5–15.5)
WBC: 13.8 10*3/uL — ABNORMAL HIGH (ref 4.0–10.5)
nRBC: 0 % (ref 0.0–0.2)

## 2020-04-16 LAB — I-STAT ARTERIAL BLOOD GAS, ED
Acid-Base Excess: 0 mmol/L (ref 0.0–2.0)
Bicarbonate: 24.9 mmol/L (ref 20.0–28.0)
Calcium, Ion: 1 mmol/L — ABNORMAL LOW (ref 1.15–1.40)
HCT: 26 % — ABNORMAL LOW (ref 36.0–46.0)
Hemoglobin: 8.8 g/dL — ABNORMAL LOW (ref 12.0–15.0)
O2 Saturation: 99 %
Patient temperature: 97.9
Potassium: 3.4 mmol/L — ABNORMAL LOW (ref 3.5–5.1)
Sodium: 131 mmol/L — ABNORMAL LOW (ref 135–145)
TCO2: 26 mmol/L (ref 22–32)
pCO2 arterial: 39.5 mmHg (ref 32.0–48.0)
pH, Arterial: 7.407 (ref 7.350–7.450)
pO2, Arterial: 114 mmHg — ABNORMAL HIGH (ref 83.0–108.0)

## 2020-04-16 LAB — TROPONIN I (HIGH SENSITIVITY)
Troponin I (High Sensitivity): 43 ng/L — ABNORMAL HIGH (ref <18)
Troponin I (High Sensitivity): 48 ng/L — ABNORMAL HIGH (ref <18)

## 2020-04-16 LAB — POC SARS CORONAVIRUS 2 AG -  ED: SARS Coronavirus 2 Ag: NEGATIVE

## 2020-04-16 LAB — SARS CORONAVIRUS 2 BY RT PCR (HOSPITAL ORDER, PERFORMED IN ~~LOC~~ HOSPITAL LAB): SARS Coronavirus 2: NEGATIVE

## 2020-04-16 LAB — TYPE AND SCREEN
ABO/RH(D): O POS
Antibody Screen: NEGATIVE

## 2020-04-16 LAB — PROCALCITONIN: Procalcitonin: 0.68 ng/mL

## 2020-04-16 MED ORDER — LEVOTHYROXINE SODIUM 88 MCG PO TABS
88.0000 ug | ORAL_TABLET | Freq: Every day | ORAL | Status: DC
  Administered 2020-04-17 – 2020-05-03 (×17): 88 ug via ORAL
  Filled 2020-04-16 (×17): qty 1

## 2020-04-16 MED ORDER — HYDROCODONE-ACETAMINOPHEN 5-325 MG PO TABS
1.0000 | ORAL_TABLET | Freq: Four times a day (QID) | ORAL | Status: DC | PRN
  Administered 2020-04-22 – 2020-05-01 (×3): 1 via ORAL
  Filled 2020-04-16 (×3): qty 1

## 2020-04-16 MED ORDER — ACETAMINOPHEN 650 MG RE SUPP: 650.0000 mg | Freq: Four times a day (QID) | RECTAL | Status: DC | PRN

## 2020-04-16 MED ORDER — VANCOMYCIN HCL 500 MG/100ML IV SOLN
500.0000 mg | Freq: Once | INTRAVENOUS | Status: AC
  Administered 2020-04-17: 500 mg via INTRAVENOUS
  Filled 2020-04-16: qty 100

## 2020-04-16 MED ORDER — VANCOMYCIN VARIABLE DOSE PER UNSTABLE RENAL FUNCTION (PHARMACIST DOSING): Status: DC

## 2020-04-16 MED ORDER — VITAMIN D 25 MCG (1000 UNIT) PO TABS
1000.0000 [IU] | ORAL_TABLET | Freq: Every day | ORAL | Status: DC
  Administered 2020-04-17 – 2020-05-03 (×17): 1000 [IU] via ORAL
  Filled 2020-04-16 (×18): qty 1

## 2020-04-16 MED ORDER — CIPROFLOXACIN IN D5W 400 MG/200ML IV SOLN
400.0000 mg | INTRAVENOUS | Status: DC
  Administered 2020-04-16: 400 mg via INTRAVENOUS
  Filled 2020-04-16: qty 200

## 2020-04-16 MED ORDER — VANCOMYCIN HCL 1250 MG/250ML IV SOLN
1250.0000 mg | Freq: Once | INTRAVENOUS | Status: AC
  Administered 2020-04-16: 1250 mg via INTRAVENOUS
  Filled 2020-04-16: qty 250

## 2020-04-16 MED ORDER — SODIUM CHLORIDE 0.9% FLUSH: 3.0000 mL | Freq: Once | INTRAVENOUS | Status: DC

## 2020-04-16 MED ORDER — SODIUM CHLORIDE 0.9 % IV SOLN: 1.0000 g | INTRAVENOUS | Status: DC

## 2020-04-16 MED ORDER — HEPARIN SODIUM (PORCINE) 1000 UNIT/ML IJ SOLN
INTRAMUSCULAR | Status: AC
  Administered 2020-04-16: 3200 [IU]
  Filled 2020-04-16: qty 4

## 2020-04-16 MED ORDER — SULFAMETHOXAZOLE-TRIMETHOPRIM 400-80 MG PO TABS
1.0000 | ORAL_TABLET | ORAL | Status: DC
  Administered 2020-04-18 – 2020-04-30 (×5): 1 via ORAL
  Filled 2020-04-16 (×9): qty 1

## 2020-04-16 MED ORDER — ONDANSETRON HCL 4 MG PO TABS
4.0000 mg | ORAL_TABLET | Freq: Four times a day (QID) | ORAL | Status: DC | PRN
  Filled 2020-04-16: qty 1

## 2020-04-16 MED ORDER — SODIUM CHLORIDE 0.9 % IV SOLN
2.0000 g | Freq: Once | INTRAVENOUS | Status: AC
  Administered 2020-04-16: 2 g via INTRAVENOUS
  Filled 2020-04-16: qty 2

## 2020-04-16 MED ORDER — CYCLOPHOSPHAMIDE 50 MG PO CAPS: 50.0000 mg | ORAL_CAPSULE | Freq: Every day | ORAL | Status: DC

## 2020-04-16 MED ORDER — HYDRALAZINE HCL 20 MG/ML IJ SOLN: 10.0000 mg | Freq: Four times a day (QID) | INTRAMUSCULAR | Status: DC | PRN

## 2020-04-16 MED ORDER — ONDANSETRON HCL 4 MG/2ML IJ SOLN
4.0000 mg | Freq: Four times a day (QID) | INTRAMUSCULAR | Status: DC | PRN
  Administered 2020-04-16: 4 mg via INTRAVENOUS
  Filled 2020-04-16: qty 2

## 2020-04-16 MED ORDER — HEPARIN SODIUM (PORCINE) 5000 UNIT/ML IJ SOLN
5000.0000 [IU] | Freq: Three times a day (TID) | INTRAMUSCULAR | Status: DC
  Administered 2020-04-16 – 2020-04-19 (×9): 5000 [IU] via SUBCUTANEOUS
  Filled 2020-04-16 (×9): qty 1

## 2020-04-16 MED ORDER — PANTOPRAZOLE SODIUM 40 MG PO TBEC
40.0000 mg | DELAYED_RELEASE_TABLET | Freq: Every day | ORAL | Status: DC
  Administered 2020-04-17 – 2020-05-03 (×17): 40 mg via ORAL
  Filled 2020-04-16 (×18): qty 1

## 2020-04-16 MED ORDER — ACETAMINOPHEN 325 MG PO TABS
650.0000 mg | ORAL_TABLET | Freq: Four times a day (QID) | ORAL | Status: DC | PRN
  Administered 2020-04-24 – 2020-05-03 (×9): 650 mg via ORAL
  Filled 2020-04-16 (×8): qty 2

## 2020-04-16 MED ORDER — SODIUM CHLORIDE 0.9 % IV SOLN
1.0000 g | INTRAVENOUS | Status: AC
  Administered 2020-04-16 – 2020-04-23 (×8): 1 g via INTRAVENOUS
  Filled 2020-04-16 (×8): qty 1

## 2020-04-16 MED ORDER — ALBUTEROL SULFATE (2.5 MG/3ML) 0.083% IN NEBU: 2.5000 mg | INHALATION_SOLUTION | RESPIRATORY_TRACT | Status: DC | PRN

## 2020-04-16 MED ORDER — CIPROFLOXACIN IN D5W 400 MG/200ML IV SOLN
400.0000 mg | INTRAVENOUS | Status: DC
  Administered 2020-04-16 – 2020-04-17 (×2): 400 mg via INTRAVENOUS
  Filled 2020-04-16 (×3): qty 200

## 2020-04-16 MED ORDER — CALCITRIOL 0.25 MCG PO CAPS
0.2500 ug | ORAL_CAPSULE | ORAL | Status: DC
  Administered 2020-04-18 – 2020-04-30 (×6): 0.25 ug via ORAL
  Filled 2020-04-16 (×3): qty 1

## 2020-04-16 MED ORDER — METHYLPREDNISOLONE SODIUM SUCC 125 MG IJ SOLR
60.0000 mg | Freq: Every day | INTRAMUSCULAR | Status: DC
  Administered 2020-04-16 – 2020-04-18 (×3): 60 mg via INTRAVENOUS
  Filled 2020-04-16 (×3): qty 2

## 2020-04-16 MED ORDER — HEPARIN SODIUM (PORCINE) 1000 UNIT/ML IJ SOLN
INTRAMUSCULAR | Status: AC
  Administered 2020-04-16: 1800 [IU]
  Filled 2020-04-16: qty 2

## 2020-04-16 MED ORDER — SULFAMETHOXAZOLE-TRIMETHOPRIM 400-80 MG PO TABS: 1.0000 | ORAL_TABLET | ORAL | Status: DC

## 2020-04-16 MED ORDER — PREDNISONE 20 MG PO TABS: 40.0000 mg | ORAL_TABLET | Freq: Every day | ORAL | Status: DC

## 2020-04-16 MED ORDER — POTASSIUM CHLORIDE 20 MEQ PO PACK
20.0000 meq | PACK | Freq: Once | ORAL | Status: DC
  Filled 2020-04-16: qty 1

## 2020-04-16 MED ORDER — METOPROLOL SUCCINATE ER 100 MG PO TB24
100.0000 mg | ORAL_TABLET | Freq: Every day | ORAL | Status: DC
  Administered 2020-04-17 – 2020-05-03 (×16): 100 mg via ORAL
  Filled 2020-04-16 (×17): qty 1

## 2020-04-16 MED ORDER — VANCOMYCIN HCL IN DEXTROSE 1-5 GM/200ML-% IV SOLN: 1000.0000 mg | Freq: Once | INTRAVENOUS | Status: DC

## 2020-04-16 MED ORDER — CIPROFLOXACIN IN D5W 400 MG/200ML IV SOLN: 400.0000 mg | Freq: Three times a day (TID) | INTRAVENOUS | Status: DC

## 2020-04-16 MED ORDER — AMITRIPTYLINE HCL 50 MG PO TABS
50.0000 mg | ORAL_TABLET | Freq: Every day | ORAL | Status: DC
  Administered 2020-04-16 – 2020-05-02 (×17): 50 mg via ORAL
  Filled 2020-04-16 (×18): qty 1

## 2020-04-16 MED ORDER — CHLORHEXIDINE GLUCONATE CLOTH 2 % EX PADS
6.0000 | MEDICATED_PAD | Freq: Every day | CUTANEOUS | Status: DC
  Administered 2020-04-17 – 2020-05-03 (×14): 6 via TOPICAL

## 2020-04-16 NOTE — Procedures (Signed)
Patient seen on Hemodialysis. BP (!) 172/94 (BP Location: Right Arm)   Pulse 96   Temp 98.7 F (37.1 C) (Oral)   Resp (!) 28   SpO2 100%   QB 400, UF goal 4L Tolerating treatment without complaints at this time.  Elmarie Shiley MD [facility]. Office # [phone] 2:33 PM

## 2020-04-16 NOTE — Progress Notes (Signed)
[facility] Progress Note   Subjective:   Patient seen and examined in ED with daughter at bedside.  Reports orthopnea and dyspnea starting around 12am and worsening throughout the morning so she came to the ED.  Additional symptoms include hemoptysis also starting overnight.  Per daughter she was breathing better while in the ED then it worsened again after having to lay down for CT. Denies fever, chills, n/v/d and abdominal pain.  Daughter states her appetite has been good and she was feeling well until last night.  Dialysis has been going well, completed last HD 7/23, removed 3.5L, and left at her dry weight.  Removed 5L at dialysis on 7/21.  Reports little urine output, about "a cup and a half" over the weekend.   CXR today with extensive patchy bilateral airspace disease compatible with multifocal pneumonia.  CT chest with very extensive airspace disease most consistent with worsened multifocal infection and underlying edema.  No obvious features of interstitial lung disease noted but exam limited due to extensive acute airspace disease.   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  Completed daily plasmapheresis and IV solumedrol. Now PO cytoxan and pred taper. Biopsy with 100% crescents and significant amount of ATN.  HD started 03/21/20.    Objective Vitals:   04/16/20 1115 04/16/20 1116 04/16/20 1117 04/16/20 1118  BP: (!) 171/83     Pulse: 99 101 102 100  Resp: (!) 33 (!) 31 (!) 29 (!) 40  Temp:      TempSrc:      SpO2: 100% 92% 90% (!) 88%   Physical Exam General:chronically ill appearing female, uncomfortable, in NAD Heart:RRR Lungs:+diffuse rhonchi and crackles throughout, increased WOB on NRB Abdomen:soft, NTND3 Extremities:1-2+ pedal edema, L>R Dialysis Access: TDC,    There were no vitals filed for this visit. No intake or output data in the 24 hours ending  04/16/20 1207  Additional Objective Labs: Basic Metabolic Panel: Recent Labs  Lab 04/10/20 0625 04/16/20 0817 04/16/20 1048  NA 137 131* 131*  K 3.7 3.3* 3.4*  CL 98 90*  --   CO2 29 25  --   GLUCOSE 129* 109*  --   BUN 35* 74*  --   CREATININE 3.65* 7.23*  --   CALCIUM 8.3* 7.9*  --   PHOS 4.6  --   --    Liver Function Tests: Recent Labs  Lab 04/10/20 0625 04/16/20 0817  AST  --  26  ALT  --  15  ALKPHOS  --  55  BILITOT  --  0.6  PROT  --  5.8*  ALBUMIN 2.9* 3.0*   CBC: Recent Labs  Lab 04/10/20 0625 04/16/20 0817 04/16/20 1048  WBC 7.1 13.8*  --   HGB 7.7* 8.4* 8.8*  HCT 24.3* 26.0* 26.0*  MCV 95.3 94.5  --   PLT 239 197  --     CBG: Recent Labs  Lab 04/09/20 1746 04/09/20 2138 04/10/20 0639 04/10/20 1117 04/10/20 1633  GLUCAP 96 114* 121* 102* 107*   Studies/Results: DG Chest 2 View  Result Date: 04/16/2020 CLINICAL DATA:  Shortness of breath, chest pain EXAM: CHEST - 2 VIEW COMPARISON:  70 20 FINDINGS: Interval extubation G2. Right dialysis catheter remains in place, unchanged. Sensitive patchy bilateral airspace opacities are noted, new since prior study compatible with multifocal pneumonia. No effusions. Heart is normal size. IMPRESSION: Extensive  patchy bilateral airspace disease compatible with multifocal pneumonia. Electronically Signed   By: Rolm Baptise M.D.   On: 04/16/2020 08:34   CT Chest High Resolution  Result Date: 04/16/2020 CLINICAL DATA:  Concern for ILD, severe shortness of breath and cough for several weeks EXAM: CT CHEST WITHOUT CONTRAST TECHNIQUE: Multidetector CT imaging of the chest was performed following the standard protocol without intravenous contrast. High resolution imaging of the lungs, as well as inspiratory and expiratory imaging, was performed. COMPARISON:  CT chest, 04/05/2020 FINDINGS: Cardiovascular: Large-bore right neck multi lumen vascular catheter. Aortic atherosclerosis. Normal heart size. Scattered  three-vessel coronary artery calcifications. No pericardial effusion. Mediastinum/Nodes: No enlarged mediastinal, hilar, or axillary lymph nodes. Thyroid gland, trachea, and esophagus demonstrate no significant findings. Lungs/Pleura: Moderate right, small left pleural effusions are slightly decreased compared to prior examination dated 04/05/2020. There is very extensive, dense, somewhat geographic heterogeneous and consolidative airspace disease throughout the lungs, which is significantly worsened compared to prior examination. There is interlobular septal thickening throughout, which is similar to prior examination. Upper Abdomen: No acute abnormality. Musculoskeletal: No chest wall mass or suspicious bone lesions identified. IMPRESSION: 1. There is very extensive, dense, somewhat geographic heterogeneous and consolidative airspace disease throughout the lungs, which is significantly worsened compared to prior examination dated 04/05/2020. There is interlobular septal thickening throughout, which is similar to prior examination. Findings are most consistent with worsened multifocal infection, with underlying edema. 2. Moderate right, small left pleural effusions are slightly decreased compared to prior examination dated 04/05/2020. 3. There are no obvious features of interstitial lung disease, however examination is very limited by the presence of extensive acute airspace opacity and pleural effusions. Follow-up examination could be performed at the resolution of acute presentation. 4. Coronary artery disease.  Aortic Atherosclerosis (ICD10-I70.0). Electronically Signed   By: Eddie Candle M.D.   On: 04/16/2020 11:34    Medications: . [START ON 04/17/2020] ceFEPime (MAXIPIME) IV    . ciprofloxacin     . heparin sodium (porcine)      . Chlorhexidine Gluconate Cloth  6 each Topical Q0600  . sodium chloride flush  3 mL Intravenous Once  . [START ON 04/17/2020] vancomycin variable dose per unstable renal  function (pharmacist dosing)   Does not apply See admin instructions    Dialysis Orders: MWF - [facility] 3.5hrs 400/800 2K 2.25Ca EDW 61.5kg  TDC LU AVF maturing   Hep 1800 unit qHD Mircera 120mcg Q2wks - last 7/23 Venofer 100mg  qHD x10 - not started Calcitriol 0.56mcg qHD   Assessment/Plan: 1. Dyspnea -  CT chest with very extensive airspace disease most consistent with worsened multifocal infection and underlying edema.  No obvious features of interstitial lung disease noted but exam limited due to extensive acute airspace disease.  ABX started, per primary/pham. Plan for dialysis today for volume removal with net UF 3-4L as tolerated.  Per d/c summary 7/20 if she has recurrent pulmonary vasculitis they recommended rituximab.  2. Dialysis dependent (anuric) AKI- on HD MWF.  HD today per regular schedule. K 3.3, use 4K bath  AKI due to RPGN from anti - GBM disease, +ANCA, ANA, dsDNA and MPO. Biopsy w/100% crescents & significant ATN  s/p daily plasmapheresis and IV solumedrol last admission.  Now on PO cytoxan and prednisone taper.   Bactrim MWF for PCP prophylaxis  3. Anemia of CKD- Hgb 8.8, ESA dosed on 7/23 4. Secondary hyperparathyroidism - CCa and Phos at goal. Continue VDRA. Not on binders.  5. HTN/volume - Blood  pressure elevated likely due to increased volume.  Expect improvement with UF.   6. Nutrition - Renal diet w/fluid restrictions 7. Hypothyroidism - on synthroid  Jen Mow, PA-C [place] 04/16/2020,12:07 PM  LOS: 0 days

## 2020-04-16 NOTE — Chronic Care Management (AMB) (Signed)
  Care Management   [facility] EMMI Note  04/16/2020 Name: Jasmine White MRN: [id number] DOB: Aug 25, 1946  [name] was outreached by an automated [facility] Discharge Follow-up call on 04/13/20 s/p discharge on 04/10/20.  [name] has a history of ESRD and is on dialysis. She presented to [facility] ED today with shortness of breath and cough and has been admitted with pneumonia.    Follow up plan: Pending discharge instructions  Chong Sicilian, BSN, RN-BC [facility] Direct Dial: [phone]

## 2020-04-16 NOTE — ED Triage Notes (Signed)
Pt bib family with reports of shob with chest tightness. Pt also coughing up frothy blood. Pt pale on arrival and appears extremely shob. Placed on 2L [place] for oxygen saturation of 92 % with improvement to 96%. Due for dialysis today at noon.

## 2020-04-16 NOTE — H&P (Signed)
History and Physical    DOA: 04/16/2020  PCP: Chevis Pretty, FNP  Patient coming from: home  Chief Complaint: dyspnea  HPI: Jasmine White is a 74 y.o. female with history h/o hypertension, hyperlipidemia, hypothyroidism, arthritis, recent admission for AKI (6/26-7/20) due to ANCA positive rapid progressive glomerulonephritis who is on hemodialysis via right permacath since 6/30, now presents with complaints of dyspnea which started around midnight.  Patient reports last hemodialysis session to be on 7/23 and was apparently in her usual state of health.  She states around midnight she felt heavy in her chest (points to left lower parasternal area) like someone sitting on her chest and rates it at 10/10 associated with dyspnea and hemoptysis but no complaints of palpitations or sweating or nausea or vomiting.  She denies  fever or chills.  When dyspnea did not improve she presented to the ED and was noted to be hypoxic requiring supplemental O2 2 L.  Afebrile, blood pressure 173/80.  She was sent to radiology for CT chest where hypoxia worsened upon laying flat (75%) requiring NRBM.  BiPAP therapy was contemplated but patient was evaluated by pulmonary and nephrology-taken for emergent dialysis-her oxygen requirement has since come down to 5 L O2 [place].  She also reports improvement in chest discomfort to 4/10 since started on dialysis.  PCCM did not feel she needed ICU admission and requested hospitalist to admit.  Of note, patient required ICU admission/intubation in previous admission and known to PCCM.  Pulmonary vasculitis was suspected at that time and treatment was recommended if she were to develop symptoms. Work-up in the ED showed WBC 13.8, hemoglobin 8.4 (repeat at 8.8), sodium 131, potassium 3.3, chloride 90, BUN 74, creatinine 7.23, calcium 7.9, glucose 109. Chest x-ray showed extensive patchy bilateral airspace disease compatible with multifocal pneumonia.  CT high resolution  revealed: Extensive, dense, somewhat geographic heterogeneous and consolidative airspace disease throughout the lungs, which is significantly worsened compared to prior examination dated 04/05/2020. There is interlobular septal thickening throughout, which is similar to prior examination. Findings are most consistent with worsened multifocal infection, with underlying edema.Moderate right, small left pleural effusions are slightly decreased compared to prior examination dated 04/05/2020.  Review of Systems: As per HPI otherwise 10 point review of systems negative.    Past Medical History:  Diagnosis Date  . Arthritis   . Bruises easily   . Cancer ([place])    basal skin cancer  . Chronic back pain    HNP/stenosis and radiculopathy  . Hyperlipidemia    takes Pravastatin daily  . Hypertension    takes Hyzaar and toprol daily  . Hypothyroidism    takes Synthroid daily  . Insomnia    takes [place] nightly  . Joint pain   . Joint swelling   . Low BP    past some sedation  . Nocturia   . PONV (postoperative nausea and vomiting)    with knee replacement b/p dropped  . Sinusitis    finished zpak yesterday  . Urinary frequency     Past Surgical History:  Procedure Laterality Date  . ABDOMINAL HYSTERECTOMY    . AV FISTULA PLACEMENT Left 04/02/2020   Procedure: ARTERIOVENOUS (AV) FISTULA CREATION;  Surgeon: Rosetta Posner, MD;  Location: [facility];  Service: Vascular;  Laterality: Left;  . BACK SURGERY    . basal cell skin cancer     on face and forehead  . bil knee replacements    . BREAST BIOPSY  left breast/benign  . COLONOSCOPY    . IR FLUORO GUIDE CV LINE RIGHT  03/20/2020  . IR US GUIDE VASC ACCESS RIGHT  03/20/2020  . LUMBAR LAMINECTOMY/DECOMPRESSION MICRODISCECTOMY  08/13/2012   Procedure: LUMBAR LAMINECTOMY/DECOMPRESSION MICRODISCECTOMY 1 LEVEL;  Surgeon: Erline Levine, MD;  Location: [facility];  Service: Neurosurgery;  Laterality: Left;  Left lumbar one-two Microdiskectomy     Social history:  reports that she has never smoked. She has never used smokeless tobacco. She reports that she does not drink alcohol and does not use drugs.   Allergies  Allergen Reactions  . Hctz [Hydrochlorothiazide] Other (See Comments)    Hx of severe Hyponatremia while taking HCTZ - now contraindicated  . Percocet [Oxycodone-Acetaminophen] Nausea Only  . Ultram [Tramadol] Nausea Only    Makes her crazy and nauseated    Family History  Problem Relation Age of Onset  . Hypertension Mother   . Heart disease Mother   . Stroke Father   . Heart disease Father   . Alcohol abuse Father   . Arthritis Brother   . Cancer Brother 93       prostate  . Arthritis Brother   . Colon cancer Neg Hx   . Colon polyps Neg Hx   . Stomach cancer Neg Hx   . Rectal cancer Neg Hx       Prior to Admission medications   Medication Sig Start Date End Date Taking? Authorizing Provider  amitriptyline (ELAVIL) 50 MG tablet Take 1 tablet (50 mg total) by mouth at bedtime. 09/02/19  Yes Hassell Done, [name], FNP  cholecalciferol (VITAMIN D) 25 MCG tablet Take 1 tablet (1,000 Units total) by mouth daily. 04/11/20  Yes Erick Colace, NP  cyclophosphamide (CYTOXAN) 50 MG capsule Take 1 capsule (50 mg total) by mouth daily at 6 PM. Take with food to minimize GI upset. Take early in the day and maintain hydration. 04/10/20  Yes Gean Quint, MD  HYDROcodone-acetaminophen (NORCO/VICODIN) 5-325 MG tablet Take 1 tablet by mouth every 6 (six) hours as needed for moderate pain. 03/01/20  Yes Hassell Done, [name], FNP  metoprolol succinate (TOPROL-XL) 100 MG 24 hr tablet Take 1 tablet (100 mg total) by mouth daily. Take with or immediately following a meal. 03/01/20  Yes Hassell Done, [name], FNP  omeprazole (PRILOSEC) 20 MG capsule Take 1 capsule (20 mg total) by mouth daily. 04/10/20 04/10/21 Yes Erick Colace, NP  predniSONE (DELTASONE) 20 MG tablet Take 2 tablets (40 mg total) by mouth daily. 04/10/20   Yes Erick Colace, NP  sulfamethoxazole-trimethoprim (BACTRIM) 400-80 MG tablet Take 1 tablet by mouth every Monday, Wednesday, and Friday. 04/11/20  Yes Erick Colace, NP  SYNTHROID 88 MCG tablet TAKE 1 TABLET BY MOUTH ONCE DAILY BEFORE BREAKFAST Patient taking differently: Take 88 mcg by mouth daily before breakfast.  03/01/20  Yes Hassell Done, [name], FNP  cyclophosphamide (CYTOXAN) 50 MG capsule Take 1 capsule (50 mg total) by mouth daily. Take with food to minimize GI upset. Take early in the day and maintain hydration. 04/10/20   Gean Quint, MD    Physical Exam: Vitals:   04/16/20 1400 04/16/20 1430 04/16/20 1500 04/16/20 1530  BP: (!) 157/93 (!) 131/84 124/70 (!) 72/56  Pulse: 96 85 89 83  Resp: (!) 28 (!) 26 (!) 26 (!) 24  Temp:      TempSrc:      SpO2: 100% 100% 100%     Constitutional: Patient seen in dialysis, in mild respiratory distress while talking  full sentences, now on nasal cannula 5 L O2. Eyes: PERRL, lids and conjunctivae normal ENMT: Mucous membranes are moist. Posterior pharynx clear of any exudate or lesions.Normal dentition.  Neck: normal, supple, no masses, no thyromegaly Respiratory: Bibasilar coarse crepitations, otherwise clear to auscultation bilaterally, no wheezing.  Mild increase in respiratory effort while talking full sentences Cardiovascular: Regular rate and rhythm, no murmurs / rubs / gallops. No extremity edema. 2+ pedal pulses. No carotid bruits.  Abdomen: no tenderness, no masses palpated. No hepatosplenomegaly. Bowel sounds positive.  Musculoskeletal: no clubbing / cyanosis. No joint deformity upper and lower extremities. Good ROM, no contractures. Normal muscle tone.  Neurologic: CN 2-12 grossly intact. Sensation intact, DTR normal. Strength 5/5 in all 4.  Psychiatric: Normal judgment and insight. Alert and oriented x 3. Normal mood.  SKIN/catheters: no rashes, lesions, ulcers. No induration  Labs on Admission: I have personally reviewed  following labs and imaging studies  CBC: Recent Labs  Lab 04/10/20 0625 04/16/20 0817 04/16/20 1048  WBC 7.1 13.8*  --   HGB 7.7* 8.4* 8.8*  HCT 24.3* 26.0* 26.0*  MCV 95.3 94.5  --   PLT 239 197  --    Basic Metabolic Panel: Recent Labs  Lab 04/10/20 0625 04/16/20 0817 04/16/20 1048  NA 137 131* 131*  K 3.7 3.3* 3.4*  CL 98 90*  --   CO2 29 25  --   GLUCOSE 129* 109*  --   BUN 35* 74*  --   CREATININE 3.65* 7.23*  --   CALCIUM 8.3* 7.9*  --   MG 2.2  --   --   PHOS 4.6  --   --    GFR: Estimated Creatinine Clearance: 6.4 mL/min (A) (by C-G formula based on SCr of 7.23 mg/dL (H)). Recent Labs  Lab 04/10/20 0625 04/16/20 0817  WBC 7.1 13.8*   Liver Function Tests: Recent Labs  Lab 04/10/20 0625 04/16/20 0817  AST  --  26  ALT  --  15  ALKPHOS  --  55  BILITOT  --  0.6  PROT  --  5.8*  ALBUMIN 2.9* 3.0*   No results for input(s): LIPASE, AMYLASE in the last 168 hours. No results for input(s): AMMONIA in the last 168 hours. Coagulation Profile: No results for input(s): INR, PROTIME in the last 168 hours. Cardiac Enzymes: No results for input(s): CKTOTAL, CKMB, CKMBINDEX, TROPONINI in the last 168 hours. BNP (last 3 results) No results for input(s): PROBNP in the last 8760 hours. HbA1C: No results for input(s): HGBA1C in the last 72 hours. CBG: Recent Labs  Lab 04/09/20 1746 04/09/20 2138 04/10/20 0639 04/10/20 1117 04/10/20 1633  GLUCAP 96 114* 121* 102* 107*   Lipid Profile: No results for input(s): CHOL, HDL, LDLCALC, TRIG, CHOLHDL, LDLDIRECT in the last 72 hours. Thyroid Function Tests: No results for input(s): TSH, T4TOTAL, FREET4, T3FREE, THYROIDAB in the last 72 hours. Anemia Panel: No results for input(s): VITAMINB12, FOLATE, FERRITIN, TIBC, IRON, RETICCTPCT in the last 72 hours. Urine analysis:    Component Value Date/Time   COLORURINE AMBER (A) 03/18/2020 2143   APPEARANCEUR HAZY (A) 03/18/2020 2143   APPEARANCEUR Clear  03/03/2019 0920   LABSPEC 1.008 03/18/2020 2143   PHURINE 6.0 03/18/2020 2143   GLUCOSEU NEGATIVE 03/18/2020 2143   HGBUR LARGE (A) 03/18/2020 2143   BILIRUBINUR NEGATIVE 03/18/2020 2143   BILIRUBINUR Negative 03/03/2019 0920   KETONESUR NEGATIVE 03/18/2020 2143   PROTEINUR 100 (A) 03/18/2020 2143   UROBILINOGEN negative  12/21/2012 1509   NITRITE NEGATIVE 03/18/2020 2143   LEUKOCYTESUR SMALL (A) 03/18/2020 2143    Radiological Exams on Admission: Personally reviewed  DG Chest 2 View  Result Date: 04/16/2020 CLINICAL DATA:  Shortness of breath, chest pain EXAM: CHEST - 2 VIEW COMPARISON:  70 20 FINDINGS: Interval extubation G2. Right dialysis catheter remains in place, unchanged. Sensitive patchy bilateral airspace opacities are noted, new since prior study compatible with multifocal pneumonia. No effusions. Heart is normal size. IMPRESSION: Extensive patchy bilateral airspace disease compatible with multifocal pneumonia. Electronically Signed   By: Rolm Baptise M.D.   On: 04/16/2020 08:34   CT Chest High Resolution  Result Date: 04/16/2020 CLINICAL DATA:  Concern for ILD, severe shortness of breath and cough for several weeks EXAM: CT CHEST WITHOUT CONTRAST TECHNIQUE: Multidetector CT imaging of the chest was performed following the standard protocol without intravenous contrast. High resolution imaging of the lungs, as well as inspiratory and expiratory imaging, was performed. COMPARISON:  CT chest, 04/05/2020 FINDINGS: Cardiovascular: Large-bore right neck multi lumen vascular catheter. Aortic atherosclerosis. Normal heart size. Scattered three-vessel coronary artery calcifications. No pericardial effusion. Mediastinum/Nodes: No enlarged mediastinal, hilar, or axillary lymph nodes. Thyroid gland, trachea, and esophagus demonstrate no significant findings. Lungs/Pleura: Moderate right, small left pleural effusions are slightly decreased compared to prior examination dated 04/05/2020. There is  very extensive, dense, somewhat geographic heterogeneous and consolidative airspace disease throughout the lungs, which is significantly worsened compared to prior examination. There is interlobular septal thickening throughout, which is similar to prior examination. Upper Abdomen: No acute abnormality. Musculoskeletal: No chest wall mass or suspicious bone lesions identified. IMPRESSION: 1. There is very extensive, dense, somewhat geographic heterogeneous and consolidative airspace disease throughout the lungs, which is significantly worsened compared to prior examination dated 04/05/2020. There is interlobular septal thickening throughout, which is similar to prior examination. Findings are most consistent with worsened multifocal infection, with underlying edema. 2. Moderate right, small left pleural effusions are slightly decreased compared to prior examination dated 04/05/2020. 3. There are no obvious features of interstitial lung disease, however examination is very limited by the presence of extensive acute airspace opacity and pleural effusions. Follow-up examination could be performed at the resolution of acute presentation. 4. Coronary artery disease.  Aortic Atherosclerosis (ICD10-I70.0). Electronically Signed   By: Eddie Candle M.D.   On: 04/16/2020 11:34    EKG: Independently reviewed.  Normal sinus rhythm     Assessment and Plan:   Active Problems:   Acute respiratory failure with hypoxia (HCC)    1.  Acute hypoxic respiratory failure: Secondary to pulmonary edema versus pneumonia versus pulmonary vasculitis.  Likely multifactorial with combination of 2 or more of these.  Rapid improvement with dialysis does support diagnosis of pulmonary edema.  Hemoptysis makes pulmonary vasculitis likely.  Sudden onset of symptoms argues against pneumonia however per pulmonary recommendations will continue antibiotics for now-patient received IV cefepime and vancomycin in the ED.  Noted IV  ciprofloxacin started this afternoon-discussed with Dr. Elsworth Soho who recommended to continue for now and will reevaluate in a.m.  Patient also on Bactrim prophylaxis-will defer vancomycin unless sputum culture indicates MRSA.  Appreciate nephrology/pulmonary evaluation and recommendations.  IV Solu-Medrol for possible vasculitis.  COVID-19 ruled out.  2.  AKI, dialysis dependent:ANCA positive rapid progressive glomerulonephritis diagnosed in most recent admission and being on dialysis 3 times weekly (MWF) since June 30.  Appreciate nephrology evaluation.  Hold prednisone as patient now on IV Solu-Medrol.  Hold Cytoxan until infection ruled  out.  Continue Bactrim for PCP prophylaxis.  3.  Hypertension: Resume home medications.  Hydralazine IV as needed.    4. Hypothyroidism continue Synthroid.  5.  Secondary hyperparathyroidism: Management per nephrology  6.  Hypokalemia: Replace  7.  Mild hyponatremia: Likely hypervolemic hyponatremia, should improve with dialysis.  8.  Hyperlipidemia: Resume home medications.  9.  Chronic back pain: Resume home medications  DVT prophylaxis: Heparin  COVID screen: Negative  Code Status: Full code   .Health care proxy would be her daughter [name]  Patient/Family Communication: Discussed with patient and all questions answered to satisfaction.  Consults called: Nephrology and PCCM Admission status :I certify that at the point of admission it is my clinical judgment that the patient will require inpatient hospital care spanning beyond 2 midnights from the point of admission due to high intensity of service and high frequency of surveillance required.Inpatient status is judged to be reasonable and necessary in order to provide the required intensity of service to ensure the patient's safety. The patient's presenting symptoms, physical exam findings, and initial radiographic and laboratory data in the context of their chronic comorbidities is felt to place them at  high risk for further clinical deterioration. The following factors support the patient status of inpatient : Acute hypoxic respiratory failure/pulmonary vasculitis/pneumonia requiring supplemental O2 IV antibiotics and IV steroids.     Guilford Shi MD [facility] Pager in [place]  If 7PM-7AM, please contact night-coverage [email or web address]   04/16/2020, 3:51 PM

## 2020-04-16 NOTE — Consult Note (Addendum)
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 Oscillating O2 needs between 2L and NRB- for comfort.   History of present illness   74 yo F PMH ANCA vasculitis,renal failure on HD,  pulmonary vasculitis, HTN, HLD, prior COVID infection who presents to ED with SOB. Notably, patient recently admitted 6/26-7/20 with progressive renal failure, respiratory failure. Received plex, cytoxan, steroids, bactrim, requiring intubation 7/15 but successfully extubated 7/18. She presents to ED with SOB which began the night prior to presentation. SOB was worse laying flat and improved when seated. Associated intermittent cough with colorless-to-tan sputum, with some blood tinged streaks. No missed HD sessions, due for HD day of ED presentation. No sick contacts, no fever chills, wheezing, no chest pain, no palpitations, no HA, dizziness.   In ED, became hypoxic when flat during CT chest, required NRB but has been down-tirtated to [facility]. PCCM and nephrology asked to evaluate in ED ED labs: WBC 13.8, Na 131 K 3.3, ABG 7.4/ 39/ 114/ 26/ 0/ 24.9/ 99  Past Medical History  ANCA vasculitis Renal Failure on HD Pulmonary vasculitis  Prior COVID-19 infection  HTN HLD Hypothyroidism  Significant Hospital Events   7/26 Presents to ED with SOB + hypoxia   Consults:  PCCM Nephrology  Procedures:    Significant Diagnostic Tests:  7/26 HRCT> bilateral pleural effusions, R>L. Bilateral ASD, suspicious for PNA and pulmonary edema. Interlobular septal thickening.   Micro Data:  7/26 SARS Cov2> neg 7/26 RVP>> 7/26 Sputum cx>>  Antimicrobials:  7/26 Vanc> 7/26 Cefepime>  7/26 cipro> 7/26 bactrim per pharm>  Interim history/subjective:  Seen in ED on 3LNC.    Objective   Blood pressure (!) 175/94, pulse 103, temperature 97.9 F (36.6 C), temperature source Oral, resp. rate 19, SpO2 100 %.       No intake or output data in the 24 hours ending 04/16/20 1110 There were no vitals filed for this visit.  Examination: General: Chronically ill appearing older adult F, seated upright on ED stretcher in mild discomfort  HEENT: NCAT pink mmm trachea midline. Anicteric sclera Lungs: Diffuse crackles, scattered rhonchi. No accessory muscle use. Cardiovascular: RRR s1s2 no rgm. Cap refill < 3 seconds  Abdomen: soft flat ndnt  Extremities: Trace BLE edema, no obvious joint deformity. No cyanosis Neuro: AAOx4 following commands  GU: defer   Resolved Hospital Problem list     Assessment & Plan:   Acute respiratory failure with hypoxia -Multifactorial in setting of acute pulmonary edema + PNA + pleural effusions based on CT  Hx pulmonary vasculitis P -Nephrology to see re HD with some components of volume overload -continue PJP ppx -- Bactrim MWF -recommend broad abx for PNA in setting of immunocompromised host (started on vanc, cefepime in ED. I have added cipro) -Sputum cx  -RVP -PCT -Titrate O2 for goal > 90%  -IS, pulm hygiene, CPT -if recurrent pulmonary vasculitis, may need to consider rituximab  -if respiratory status declines, patient would want intubation/MV   ANCA vasculitis  ESRD on HD MWF -no missed HD sessions, making very little urine since last HD 7/23 P -At home is on cytoxan and prednisone   HTN -anticipate HD may improve acute hypertension to a degree -home med is toprol  xl 100mg  qD  Hypothyroidism -synthroid     Stable at this time for admission to Hospitalist service. Pulmonary will continue to follow.   Best practice:  Diet: NPO Pain/Anxiety/Delirium protocol (if indicated): na VAP protocol (if indicated): na DVT prophylaxis: per primary GI prophylaxis: home prilosec  Glucose control: monitor Mobility:  BR Code Status: Full  Family Communication: Discussed with patient and daughter at bedside in ED, again with pt + daughter + EM physician at bedside  Disposition: Stable for admission to hospitalist service   Labs   CBC: Recent Labs  Lab 04/10/20 0625 04/16/20 0817 04/16/20 1048  WBC 7.1 13.8*  --   HGB 7.7* 8.4* 8.8*  HCT 24.3* 26.0* 26.0*  MCV 95.3 94.5  --   PLT 239 197  --     Basic Metabolic Panel: Recent Labs  Lab 04/10/20 0625 04/16/20 0817 04/16/20 1048  NA 137 131* 131*  K 3.7 3.3* 3.4*  CL 98 90*  --   CO2 29 25  --   GLUCOSE 129* 109*  --   BUN 35* 74*  --   CREATININE 3.65* 7.23*  --   CALCIUM 8.3* 7.9*  --   MG 2.2  --   --   PHOS 4.6  --   --    GFR: Estimated Creatinine Clearance: 6.4 mL/min (A) (by C-G formula based on SCr of 7.23 mg/dL (H)). Recent Labs  Lab 04/10/20 0625 04/16/20 0817  WBC 7.1 13.8*    Liver Function Tests: Recent Labs  Lab 04/10/20 0625 04/16/20 0817  AST  --  26  ALT  --  15  ALKPHOS  --  55  BILITOT  --  0.6  PROT  --  5.8*  ALBUMIN 2.9* 3.0*   No results for input(s): LIPASE, AMYLASE in the last 168 hours. No results for input(s): AMMONIA in the last 168 hours.  ABG    Component Value Date/Time   PHART 7.407 04/16/2020 1048   PCO2ART 39.5 04/16/2020 1048   PO2ART 114 (H) 04/16/2020 1048   HCO3 24.9 04/16/2020 1048   TCO2 26 04/16/2020 1048   O2SAT 99.0 04/16/2020 1048     Coagulation Profile: No results for input(s): INR, PROTIME in the last 168 hours.  Cardiac Enzymes: No results for input(s): CKTOTAL, CKMB, CKMBINDEX, TROPONINI in the last 168 hours.  HbA1C: Hgb A1c MFr Bld  Date/Time Value Ref Range Status  03/21/2020 07:31 PM 5.9 (H) 4.8 - 5.6 % Final    Comment:    (NOTE) Pre diabetes:          5.7%-6.4%  Diabetes:              >6.4%  Glycemic control for   <7.0% adults with diabetes     CBG: Recent Labs  Lab 04/09/20 1746 04/09/20 2138 04/10/20 0639 04/10/20 1117  04/10/20 1633  GLUCAP 96 114* 121* 102* 107*    Review of Systems:   As per HPI  Past Medical History  She,  has a past medical history of Arthritis, Bruises easily, Cancer ([place]), Chronic back pain, Hyperlipidemia, Hypertension, Hypothyroidism, Insomnia, Joint pain, Joint swelling, Low BP, Nocturia, PONV (postoperative nausea and vomiting), Sinusitis, and Urinary frequency.   Surgical History    Past Surgical History:  Procedure Laterality Date  . ABDOMINAL HYSTERECTOMY    . AV FISTULA PLACEMENT Left 04/02/2020   Procedure: ARTERIOVENOUS (AV) FISTULA CREATION;  Surgeon: Rosetta Posner, MD;  Location: [facility];  Service: Vascular;  Laterality: Left;  .  BACK SURGERY    . basal cell skin cancer     on face and forehead  . bil knee replacements    . BREAST BIOPSY     left breast/benign  . COLONOSCOPY    . IR FLUORO GUIDE CV LINE RIGHT  03/20/2020  . IR US GUIDE VASC ACCESS RIGHT  03/20/2020  . LUMBAR LAMINECTOMY/DECOMPRESSION MICRODISCECTOMY  08/13/2012   Procedure: LUMBAR LAMINECTOMY/DECOMPRESSION MICRODISCECTOMY 1 LEVEL;  Surgeon: Erline Levine, MD;  Location: [facility];  Service: Neurosurgery;  Laterality: Left;  Left lumbar one-two Microdiskectomy     Social History   reports that she has never smoked. She has never used smokeless tobacco. She reports that she does not drink alcohol and does not use drugs.   Family History   Her family history includes Alcohol abuse in her father; Arthritis in her brother and brother; Cancer (age of onset: 46) in her brother; Heart disease in her father and mother; Hypertension in her mother; Stroke in her father. There is no history of Colon cancer, Colon polyps, Stomach cancer, or Rectal cancer.   Allergies Allergies  Allergen Reactions  . Hctz [Hydrochlorothiazide] Other (See Comments)    Hx of severe Hyponatremia while taking HCTZ - now contraindicated  . Percocet [Oxycodone-Acetaminophen] Nausea Only  . Ultram [Tramadol] Nausea Only     Makes her crazy and nauseated     Home Medications  Prior to Admission medications   Medication Sig Start Date End Date Taking? Authorizing Provider  amitriptyline (ELAVIL) 50 MG tablet Take 1 tablet (50 mg total) by mouth at bedtime. 09/02/19  Yes Hassell Done, [name], FNP  cholecalciferol (VITAMIN D) 25 MCG tablet Take 1 tablet (1,000 Units total) by mouth daily. 04/11/20  Yes Erick Colace, NP  cyclophosphamide (CYTOXAN) 50 MG capsule Take 1 capsule (50 mg total) by mouth daily at 6 PM. Take with food to minimize GI upset. Take early in the day and maintain hydration. 04/10/20  Yes Gean Quint, MD  HYDROcodone-acetaminophen (NORCO/VICODIN) 5-325 MG tablet Take 1 tablet by mouth every 6 (six) hours as needed for moderate pain. 03/01/20  Yes Hassell Done, [name], FNP  metoprolol succinate (TOPROL-XL) 100 MG 24 hr tablet Take 1 tablet (100 mg total) by mouth daily. Take with or immediately following a meal. 03/01/20  Yes Hassell Done, [name], FNP  omeprazole (PRILOSEC) 20 MG capsule Take 1 capsule (20 mg total) by mouth daily. 04/10/20 04/10/21 Yes Erick Colace, NP  predniSONE (DELTASONE) 20 MG tablet Take 2 tablets (40 mg total) by mouth daily. 04/10/20  Yes Erick Colace, NP  sulfamethoxazole-trimethoprim (BACTRIM) 400-80 MG tablet Take 1 tablet by mouth every Monday, Wednesday, and Friday. 04/11/20  Yes Erick Colace, NP  SYNTHROID 88 MCG tablet TAKE 1 TABLET BY MOUTH ONCE DAILY BEFORE BREAKFAST Patient taking differently: Take 88 mcg by mouth daily before breakfast.  03/01/20  Yes Hassell Done, [name], FNP  cyclophosphamide (CYTOXAN) 50 MG capsule Take 1 capsule (50 mg total) by mouth daily. Take with food to minimize GI upset. Take early in the day and maintain hydration. 04/10/20   Gean Quint, MD      Eliseo Gum MSN, AGACNP-BC [place] [phone] If no answer, [phone] 04/16/2020, 12:27 PM

## 2020-04-16 NOTE — ED Provider Notes (Signed)
[facility] EMERGENCY DEPARTMENT Provider Note   CSN: [id number] Arrival date & time: 04/16/20  1950     History Chief Complaint  Patient presents with  . Shortness of Breath  . Chest Pain    Jasmine White is a 74 y.o. female with a history of pulmonary vasculitis, autoimmune end-stage renal disease on dialysis Monday Wednesday Friday (she does make a small amount of urine), presented emergency department with shortness of breath and cough.  She also describes chest pressure.  Patient reports symptoms been ongoing for several days.  She last night she felt there was an elephant on her chest.  She says she has been coughing persistently bringing up frothy sputum.  She has not missed any dialysis.  She is due for neck session today at noon.  Of note, she was just recently discharged in the hospital on 9/32/67 after complicated hospital course including intubation for hypoxic respiratory failure, started on steroids, cyotxan and plasmapheresis at that time, extubated on 04/08/20, discharged home on cytoxan and slow pred taper (60 mg over 6 months) which she has been taking.  Denies hx of MI  She denies any history of COPD and does not wear oxygen at home.  She is not a smoker.  She does report that she had Covid illness several months ago.  Subsequently she was vaccinated with the majority vaccines back in January.   HPI     Past Medical History:  Diagnosis Date  . Arthritis   . Bruises easily   . Cancer ([place])    basal skin cancer  . Chronic back pain    HNP/stenosis and radiculopathy  . Hyperlipidemia    takes Pravastatin daily  . Hypertension    takes Hyzaar and toprol daily  . Hypothyroidism    takes Synthroid daily  . Insomnia    takes Elevil nightly  . Joint pain   . Joint swelling   . Low BP    past some sedation  . Nocturia   . PONV (postoperative nausea and vomiting)    with knee replacement b/p dropped  . Sinusitis    finished zpak  yesterday  . Urinary frequency     Patient Active Problem List   Diagnosis Date Noted  . Acute respiratory failure with hypoxia ([place]) 04/05/2020  . Acute renal failure ([place])   . Intractable vomiting   . Hyponatremia 03/17/2020  . Primary insomnia 01/20/2019  . BMI 27.0-27.9,adult 06/29/2015  . Chronic back pain 07/06/2014  . Essential hypertension, benign 12/21/2012  . Hyperlipidemia with target LDL less than 130 12/21/2012  . Hypothyroidism 12/21/2012  . Depression 12/21/2012    Past Surgical History:  Procedure Laterality Date  . ABDOMINAL HYSTERECTOMY    . AV FISTULA PLACEMENT Left 04/02/2020   Procedure: ARTERIOVENOUS (AV) FISTULA CREATION;  Surgeon: Rosetta Posner, MD;  Location: [place];  Service: Vascular;  Laterality: Left;  . BACK SURGERY    . basal cell skin cancer     on face and forehead  . bil knee replacements    . BREAST BIOPSY     left breast/benign  . COLONOSCOPY    . IR FLUORO GUIDE CV LINE RIGHT  03/20/2020  . IR US GUIDE VASC ACCESS RIGHT  03/20/2020  . LUMBAR LAMINECTOMY/DECOMPRESSION MICRODISCECTOMY  08/13/2012   Procedure: LUMBAR LAMINECTOMY/DECOMPRESSION MICRODISCECTOMY 1 LEVEL;  Surgeon: Erline Levine, MD;  Location: [place] NEURO ORS;  Service: Neurosurgery;  Laterality: Left;  Left lumbar one-two Microdiskectomy  OB History   No obstetric history on file.     Family History  Problem Relation Age of Onset  . Hypertension Mother   . Heart disease Mother   . Stroke Father   . Heart disease Father   . Alcohol abuse Father   . Arthritis Brother   . Cancer Brother 6       prostate  . Arthritis Brother   . Colon cancer Neg Hx   . Colon polyps Neg Hx   . Stomach cancer Neg Hx   . Rectal cancer Neg Hx     Social History   Tobacco Use  . Smoking status: Never Smoker  . Smokeless tobacco: Never Used  Vaping Use  . Vaping Use: Never used  Substance Use Topics  . Alcohol use: No    Alcohol/week: 0.0 standard drinks  . Drug use: No    Home  Medications Prior to Admission medications   Medication Sig Start Date End Date Taking? Authorizing Provider  amitriptyline (ELAVIL) 50 MG tablet Take 1 tablet (50 mg total) by mouth at bedtime. 09/02/19  Yes Hassell Done, [name], FNP  cholecalciferol (VITAMIN D) 25 MCG tablet Take 1 tablet (1,000 Units total) by mouth daily. 04/11/20  Yes Erick Colace, NP  cyclophosphamide (CYTOXAN) 50 MG capsule Take 1 capsule (50 mg total) by mouth daily at 6 PM. Take with food to minimize GI upset. Take early in the day and maintain hydration. 04/10/20  Yes Gean Quint, MD  HYDROcodone-acetaminophen (NORCO/VICODIN) 5-325 MG tablet Take 1 tablet by mouth every 6 (six) hours as needed for moderate pain. 03/01/20  Yes Hassell Done, [name], FNP  metoprolol succinate (TOPROL-XL) 100 MG 24 hr tablet Take 1 tablet (100 mg total) by mouth daily. Take with or immediately following a meal. 03/01/20  Yes Hassell Done, [name], FNP  omeprazole (PRILOSEC) 20 MG capsule Take 1 capsule (20 mg total) by mouth daily. 04/10/20 04/10/21 Yes Erick Colace, NP  predniSONE (DELTASONE) 20 MG tablet Take 2 tablets (40 mg total) by mouth daily. 04/10/20  Yes Erick Colace, NP  sulfamethoxazole-trimethoprim (BACTRIM) 400-80 MG tablet Take 1 tablet by mouth every Monday, Wednesday, and Friday. 04/11/20  Yes Erick Colace, NP  SYNTHROID 88 MCG tablet TAKE 1 TABLET BY MOUTH ONCE DAILY BEFORE BREAKFAST Patient taking differently: Take 88 mcg by mouth daily before breakfast.  03/01/20  Yes Hassell Done, [name], FNP  cyclophosphamide (CYTOXAN) 50 MG capsule Take 1 capsule (50 mg total) by mouth daily. Take with food to minimize GI upset. Take early in the day and maintain hydration. 04/10/20   Gean Quint, MD    Allergies    Hctz [hydrochlorothiazide], Percocet [oxycodone-acetaminophen], and Ultram [tramadol]  Review of Systems   Review of Systems  Constitutional: Negative for chills and fever.  HENT: Negative for ear pain and  sore throat.   Eyes: Negative for pain and visual disturbance.  Respiratory: Positive for cough, chest tightness and shortness of breath.   Cardiovascular: Positive for chest pain. Negative for palpitations.  Gastrointestinal: Negative for abdominal pain and vomiting.  Genitourinary: Negative for dysuria and hematuria.  Musculoskeletal: Negative for arthralgias and back pain.  Skin: Negative for color change and rash.  Neurological: Negative for syncope and headaches.  All other systems reviewed and are negative.   Physical Exam Updated Vital Signs BP (!) 160/83   Pulse 101   Temp 97.7 F (36.5 C) (Oral)   Resp 22   SpO2 100%   Physical Exam Vitals and nursing  note reviewed.  Constitutional:      General: She is not in acute distress.    Appearance: She is well-developed.  HENT:     Head: Normocephalic and atraumatic.  Eyes:     Conjunctiva/sclera: Conjunctivae normal.  Cardiovascular:     Rate and Rhythm: Normal rate and regular rhythm.  Pulmonary:     Effort: Pulmonary effort is normal. No accessory muscle usage.     Breath sounds: Rhonchi present.     Comments: 95% on [place] on arrival, RR 30, speaking in full sentences Abdominal:     Palpations: Abdomen is soft.     Tenderness: There is no abdominal tenderness.  Musculoskeletal:     Cervical back: Neck supple.  Skin:    General: Skin is warm and dry.  Neurological:     General: No focal deficit present.     Mental Status: She is alert and oriented to person, place, and time.     ED Results / Procedures / Treatments   Labs (all labs ordered are listed, but only abnormal results are displayed) Labs Reviewed  CBC - Abnormal; Notable for the following components:      Result Value   WBC 13.8 (*)    RBC 2.75 (*)    Hemoglobin 8.4 (*)    HCT 26.0 (*)    RDW 19.5 (*)    All other components within normal limits  COMPREHENSIVE METABOLIC PANEL - Abnormal; Notable for the following components:   Sodium 131 (*)     Potassium 3.3 (*)    Chloride 90 (*)    Glucose, Bld 109 (*)    BUN 74 (*)    Creatinine, Ser 7.23 (*)    Calcium 7.9 (*)    Total Protein 5.8 (*)    Albumin 3.0 (*)    GFR calc non Af Amer 5 (*)    GFR calc Af Amer 6 (*)    Anion gap 16 (*)    All other components within normal limits  I-STAT ARTERIAL BLOOD GAS, ED - Abnormal; Notable for the following components:   pO2, Arterial 114 (*)    Sodium 131 (*)    Potassium 3.4 (*)    Calcium, Ion 1.00 (*)    HCT 26.0 (*)    Hemoglobin 8.8 (*)    All other components within normal limits  TROPONIN I (HIGH SENSITIVITY) - Abnormal; Notable for the following components:   Troponin I (High Sensitivity) 43 (*)    All other components within normal limits  TROPONIN I (HIGH SENSITIVITY) - Abnormal; Notable for the following components:   Troponin I (High Sensitivity) 48 (*)    All other components within normal limits  SARS CORONAVIRUS 2 BY RT PCR (HOSPITAL ORDER, [place] [facility])  RESPIRATORY PANEL BY PCR  EXPECTORATED SPUTUM ASSESSMENT W REFEX TO RESP CULTURE  BLOOD GAS, ARTERIAL  PROCALCITONIN  BASIC METABOLIC PANEL  CBC  POC SARS CORONAVIRUS 2 AG -  ED  POC SARS CORONAVIRUS 2 AG -  ED  TYPE AND SCREEN    EKG EKG Interpretation  Date/Time:  Monday April 16 2020 08:10:35 EDT Ventricular Rate:  91 PR Interval:  166 QRS Duration: 72 QT Interval:  350 QTC Calculation: 430 R Axis:   69 Text Interpretation: Normal sinus rhythm Normal ECG No STEMI Confirmed by Octaviano Glow [phone]) on 04/16/2020 8:39:55 AM Also confirmed by Octaviano Glow [phone]), editor Hattie Perch ([id number])  on 04/16/2020 2:07:59 PM   Radiology DG  Chest 2 View  Result Date: 04/16/2020 CLINICAL DATA:  Shortness of breath, chest pain EXAM: CHEST - 2 VIEW COMPARISON:  70 20 FINDINGS: Interval extubation G2. Right dialysis catheter remains in place, unchanged. Sensitive patchy bilateral airspace opacities are noted, new since prior  study compatible with multifocal pneumonia. No effusions. Heart is normal size. IMPRESSION: Extensive patchy bilateral airspace disease compatible with multifocal pneumonia. Electronically Signed   By: Rolm Baptise M.D.   On: 04/16/2020 08:34   CT Chest High Resolution  Result Date: 04/16/2020 CLINICAL DATA:  Concern for ILD, severe shortness of breath and cough for several weeks EXAM: CT CHEST WITHOUT CONTRAST TECHNIQUE: Multidetector CT imaging of the chest was performed following the standard protocol without intravenous contrast. High resolution imaging of the lungs, as well as inspiratory and expiratory imaging, was performed. COMPARISON:  CT chest, 04/05/2020 FINDINGS: Cardiovascular: Large-bore right neck multi lumen vascular catheter. Aortic atherosclerosis. Normal heart size. Scattered three-vessel coronary artery calcifications. No pericardial effusion. Mediastinum/Nodes: No enlarged mediastinal, hilar, or axillary lymph nodes. Thyroid gland, trachea, and esophagus demonstrate no significant findings. Lungs/Pleura: Moderate right, small left pleural effusions are slightly decreased compared to prior examination dated 04/05/2020. There is very extensive, dense, somewhat geographic heterogeneous and consolidative airspace disease throughout the lungs, which is significantly worsened compared to prior examination. There is interlobular septal thickening throughout, which is similar to prior examination. Upper Abdomen: No acute abnormality. Musculoskeletal: No chest wall mass or suspicious bone lesions identified. IMPRESSION: 1. There is very extensive, dense, somewhat geographic heterogeneous and consolidative airspace disease throughout the lungs, which is significantly worsened compared to prior examination dated 04/05/2020. There is interlobular septal thickening throughout, which is similar to prior examination. Findings are most consistent with worsened multifocal infection, with underlying edema. 2.  Moderate right, small left pleural effusions are slightly decreased compared to prior examination dated 04/05/2020. 3. There are no obvious features of interstitial lung disease, however examination is very limited by the presence of extensive acute airspace opacity and pleural effusions. Follow-up examination could be performed at the resolution of acute presentation. 4. Coronary artery disease.  Aortic Atherosclerosis (ICD10-I70.0). Electronically Signed   By: Eddie Candle M.D.   On: 04/16/2020 11:34    Procedures .Critical Care Performed by: Wyvonnia Dusky, MD Authorized by: Wyvonnia Dusky, MD   Critical care provider statement:    Critical care time (minutes):  45   Critical care was necessary to treat or prevent imminent or life-threatening deterioration of the following conditions:  Respiratory failure   Critical care was time spent personally by me on the following activities:  Discussions with consultants, evaluation of patient's response to treatment, examination of patient, ordering and performing treatments and interventions, ordering and review of laboratory studies, ordering and review of radiographic studies, pulse oximetry, re-evaluation of patient's condition, obtaining history from patient or surrogate and review of old charts Comments:     Persistent hypoxia requiring supplemental oxygen including non-rebreather, ABG and repeat bedside reassessments    (including critical care time)  Medications Ordered in ED Medications  sodium chloride flush (NS) 0.9 % injection 3 mL (3 mLs Intravenous Not Given 04/16/20 0851)  ceFEPIme (MAXIPIME) 1 g in sodium chloride 0.9 % 100 mL IVPB (has no administration in time range)  vancomycin variable dose per unstable renal function (pharmacist dosing) (has no administration in time range)  Chlorhexidine Gluconate Cloth 2 % PADS 6 each (has no administration in time range)  ciprofloxacin (CIPRO) IVPB 400 mg (has no  administration in time  range)  calcitRIOL (ROCALTROL) capsule 0.25 mcg (has no administration in time range)  sulfamethoxazole-trimethoprim (BACTRIM) 400-80 MG per tablet 1 tablet (has no administration in time range)  methylPREDNISolone sodium succinate (SOLU-MEDROL) 125 mg/2 mL injection 60 mg (has no administration in time range)  HYDROcodone-acetaminophen (NORCO/VICODIN) 5-325 MG per tablet 1 tablet (has no administration in time range)  metoprolol succinate (TOPROL-XL) 24 hr tablet 100 mg (has no administration in time range)  amitriptyline (ELAVIL) tablet 50 mg (has no administration in time range)  levothyroxine (SYNTHROID) tablet 88 mcg (has no administration in time range)  pantoprazole (PROTONIX) EC tablet 40 mg (has no administration in time range)  cholecalciferol (VITAMIN D3) tablet 1,000 Units (has no administration in time range)  heparin injection 5,000 Units (has no administration in time range)  ondansetron (ZOFRAN) tablet 4 mg (has no administration in time range)    Or  ondansetron (ZOFRAN) injection 4 mg (has no administration in time range)  acetaminophen (TYLENOL) tablet 650 mg (has no administration in time range)    Or  acetaminophen (TYLENOL) suppository 650 mg (has no administration in time range)  hydrALAZINE (APRESOLINE) injection 10 mg (has no administration in time range)  ceFEPIme (MAXIPIME) 2 g in sodium chloride 0.9 % 100 mL IVPB (0 g Intravenous Stopped 04/16/20 0948)  vancomycin (VANCOREADY) IVPB 1250 mg/250 mL (0 mg Intravenous Stopped 04/16/20 1134)  heparin sodium (porcine) 1000 UNIT/ML injection (1,800 Units  Given 04/16/20 1316)  heparin sodium (porcine) 1000 UNIT/ML injection (3,200 Units  Given 04/16/20 1524)    ED Course  I have reviewed the triage vital signs and the nursing notes.  Pertinent labs & imaging results that were available during my care of the patient were reviewed by me and considered in my medical decision making (see chart for details).  74 yo female w/  hx of pulmonary vasculitis, recent ICU hospitalization for AKI requiring CRRT, intubation for bronchoscopy, now on chronic steroids, presenting back to ED with chest pressure, cough, shortness of breath.  She has not missed any dialysis.  Her lungs sounds coarse, and it's difficult to tell from this history whether this is pulmonary edema or her interstitial lung disease, or a new infection.  I felt it was prudent to initiate antibiotics for HCAP with her xray and CT findings.  I also consulted critical care who had recently managed her lung disease as well as nephrology requiring emergent dialysis for her hypoxia and SOB.  She was ultimately admitted to the hospitalist after dialysis from the ED.  During her stay in the ED she became hypoxic at CT while laying flat and returned to her room afterwards notably more tachypneic and on full NRB.  I ordered bipap, but while waiting on her covid results her breathing improved in the upright position, and I felt she was stable on NRB to get her dialysis done.  Her ABG performed in this interim showed normal pH of 7.4 and PO2 of 114.   I personally reviewed her labs showing flat troponins, negative COVID, ABG with 7.4/39.5/114/25, Cr 7.2 (worsening from last week, doubled today), K+ 3.3. ECG per my interpretation without acute ischemic findings DG chest shows bilateral patchy opacities consistent with prior lung disease or possible multifocal PNA   Clinical Course as of Apr 16 1706  Mon Apr 16, 2020  0904 Here patient is stable at 95% on 3L [place]   [MT]  0917 I spoke to Dr Posey Pronto from nephrology who will come  assess patient   [MT]  1036 Hypoxic at CT with 78% O2 while laying flat, now 95% on NRB but tachypneic with RR 40, feeling better sitting upright, suspect pulm edema.  Requested bipap from respiratory, I do not believe we need emergent intubation but she could benefit from dialysis.  Hospitalist Dr Murrell Redden updated on status, recommending SDU at this moment  for admission.   [MT]  1916 Crit care consulted regarding resp requirements   [MT]  1055 pH, Arterial: 7.407 [MT]  1055 pCO2 arterial: 39.5 [MT]  1055 pO2, Arterial(!): 114 [MT]  1147 Delta trop flat 43 -> 48.  Patient never initiated on bipap, while awaiting covid results she had improvement on NRB and sitting upright, WOB has improved.  Crit care team has evaluated and feels she will likely improve her resp status after dialysis, and would be stable for a non-ICU admission at this time.  They will leave pulmonary recommendations.  The hospitalist was informed.   [MT]    Clinical Course User Index [MT] Eniola Cerullo, Carola Rhine, MD    Final Clinical Impression(s) / ED Diagnoses Final diagnoses:  Hypoxia  Shortness of breath  Healthcare-associated pneumonia    Rx / DC Orders ED Discharge Orders    None       Wyvonnia Dusky, MD 04/16/20 [phone]

## 2020-04-16 NOTE — Progress Notes (Signed)
Admission from the ED by bed awake and alert. 

## 2020-04-16 NOTE — Progress Notes (Signed)
Pharmacy Antibiotic Note  Jasmine White is a 74 y.o. female admitted on 04/16/2020 with pneumonia.  Patient with HD earlier today  Weight: 60.5 kg (133 lb 6.1 oz) (standing)  Temp (24hrs), Avg:98.5 F (36.9 C), Min:97.7 F (36.5 C), Max:99.7 F (37.6 C)  Recent Labs  Lab 04/10/20 0625 04/16/20 0817  WBC 7.1 13.8*  CREATININE 3.65* 7.23*    Estimated Creatinine Clearance: 6.4 mL/min (A) (by C-G formula based on SCr of 7.23 mg/dL (H)).    Allergies  Allergen Reactions  . Hctz [Hydrochlorothiazide] Other (See Comments)    Hx of severe Hyponatremia while taking HCTZ - now contraindicated  . Percocet [Oxycodone-Acetaminophen] Nausea Only  . Ultram [Tramadol] Nausea Only    Makes her crazy and nauseated   Plan: vanc 500 mg x 1 Adjust cefepime to 1 g q24h to be given daily at 2000 (to attempt to be after HD) Adjust ciprofloxacin to 400 mg q24h to be given daily at 2000 (to attempt to be after HD) F/u HD schedule for further vanc  Barth Kirks, PharmD, BCPS, BCCCP Clinical Pharmacist [phone]  Please check AMION for all [place] numbers  04/16/2020 6:59 PM

## 2020-04-16 NOTE — Progress Notes (Signed)
This encounter was created in error - please disregard.

## 2020-04-16 NOTE — Progress Notes (Addendum)
Pharmacy Antibiotic Note  Jasmine White is a 74 y.o. female admitted on 04/16/2020 with pneumonia.  Pharmacy has been consulted for vancomycin/cefepime dosing. ESRD on HD MWF - no missed treatments per EDP note.  Plan: Cefepime 2g IV x 1; then 1g IV q24h Vancomycin 1250mg  IV x1; then Vancomycin 500 mg IV qHD Monitor clinical progress, c/s, abx plan/LOT Pre-HD vancomycin level as indicated F/u HD schedule/tolerance inpatient prior to entering vancomycin maintenance doses  ADDENDUM: Pharmacy consulted to confirm dosing for Bactrim for PCP prophylaxis. Patient takes 1 SS tablet PO three times weekly PTA. This dose is appropriate for ESRD on HD patient.  Plan: Continue PTA Bactrim 1 SS tablet PO TIW at 1800 (on dialysis days, give after HD) Monitor Nephrology plans, K     Temp (24hrs), Avg:98.3 F (36.8 C), Min:97.9 F (36.6 C), Max:98.7 F (37.1 C)  Recent Labs  Lab 04/10/20 0625 04/16/20 0817  WBC 7.1 13.8*  CREATININE 3.65* 7.23*    Estimated Creatinine Clearance: 6.4 mL/min (A) (by C-G formula based on SCr of 7.23 mg/dL (H)).    Allergies  Allergen Reactions  . Hctz [Hydrochlorothiazide] Other (See Comments)    Hx of severe Hyponatremia while taking HCTZ - now contraindicated  . Percocet [Oxycodone-Acetaminophen] Nausea Only  . Ultram [Tramadol] Nausea Only    Makes her crazy and nauseated    Antimicrobials this admission: 7/26 vancomycin >>  7/26 cefepime >>   Dose adjustments this admission:   Microbiology results:   Arturo Morton, PharmD, BCPS Please check AMION for all [place] contact numbers Clinical Pharmacist 04/16/2020 9:10 AM

## 2020-04-17 DIAGNOSIS — J810 Acute pulmonary edema: Secondary | ICD-10-CM

## 2020-04-17 DIAGNOSIS — Y95 Nosocomial condition: Secondary | ICD-10-CM

## 2020-04-17 DIAGNOSIS — J189 Pneumonia, unspecified organism: Secondary | ICD-10-CM

## 2020-04-17 DIAGNOSIS — N019 Rapidly progressive nephritic syndrome with unspecified morphologic changes: Secondary | ICD-10-CM

## 2020-04-17 DIAGNOSIS — N189 Chronic kidney disease, unspecified: Secondary | ICD-10-CM

## 2020-04-17 DIAGNOSIS — D631 Anemia in chronic kidney disease: Secondary | ICD-10-CM

## 2020-04-17 LAB — BASIC METABOLIC PANEL
Anion gap: 14 (ref 5–15)
BUN: 34 mg/dL — ABNORMAL HIGH (ref 8–23)
CO2: 22 mmol/L (ref 22–32)
Calcium: 7.5 mg/dL — ABNORMAL LOW (ref 8.9–10.3)
Chloride: 95 mmol/L — ABNORMAL LOW (ref 98–111)
Creatinine, Ser: 4.35 mg/dL — ABNORMAL HIGH (ref 0.44–1.00)
GFR calc Af Amer: 11 mL/min — ABNORMAL LOW (ref >60)
GFR calc non Af Amer: 9 mL/min — ABNORMAL LOW (ref >60)
Glucose, Bld: 181 mg/dL — ABNORMAL HIGH (ref 70–99)
Potassium: 4.4 mmol/L (ref 3.5–5.1)
Sodium: 131 mmol/L — ABNORMAL LOW (ref 135–145)

## 2020-04-17 LAB — CBC
HCT: 24.8 % — ABNORMAL LOW (ref 36.0–46.0)
Hemoglobin: 8.1 g/dL — ABNORMAL LOW (ref 12.0–15.0)
MCH: 31.2 pg (ref 26.0–34.0)
MCHC: 32.7 g/dL (ref 30.0–36.0)
MCV: 95.4 fL (ref 80.0–100.0)
Platelets: 164 10*3/uL (ref 150–400)
RBC: 2.6 MIL/uL — ABNORMAL LOW (ref 3.87–5.11)
RDW: 19.9 % — ABNORMAL HIGH (ref 11.5–15.5)
WBC: 14.9 10*3/uL — ABNORMAL HIGH (ref 4.0–10.5)
nRBC: 0 % (ref 0.0–0.2)

## 2020-04-17 MED ORDER — IPRATROPIUM-ALBUTEROL 0.5-2.5 (3) MG/3ML IN SOLN
3.0000 mL | Freq: Three times a day (TID) | RESPIRATORY_TRACT | Status: DC
  Administered 2020-04-17: 3 mL via RESPIRATORY_TRACT
  Filled 2020-04-17: qty 3

## 2020-04-17 MED ORDER — HEPARIN SODIUM (PORCINE) 1000 UNIT/ML DIALYSIS
40.0000 [IU]/kg | INTRAMUSCULAR | Status: DC | PRN
  Filled 2020-04-17: qty 3

## 2020-04-17 MED ORDER — VANCOMYCIN HCL 500 MG/100ML IV SOLN
500.0000 mg | Freq: Once | INTRAVENOUS | Status: AC
  Administered 2020-04-17: 500 mg via INTRAVENOUS
  Filled 2020-04-17: qty 100

## 2020-04-17 MED ORDER — HEPARIN SODIUM (PORCINE) 1000 UNIT/ML IJ SOLN
INTRAMUSCULAR | Status: AC
  Filled 2020-04-17: qty 4

## 2020-04-17 MED ORDER — RENA-VITE PO TABS
1.0000 | ORAL_TABLET | Freq: Every day | ORAL | Status: DC
  Administered 2020-04-17 – 2020-05-02 (×16): 1 via ORAL
  Filled 2020-04-17 (×16): qty 1

## 2020-04-17 NOTE — Progress Notes (Signed)
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  CXR concerning for multifocal pneumonia.  High-resolution CT chest with very extensive, dense and somewhat geographic heterogeneous and consolidative airspace disease throughout the lung with moderate right and small left pleural effusion.  She desaturated to 75% lying down for CT scan requiring NRB.  She received emergent HD with ultrafiltration of 3.6 L and improvement in her breathing.  She was eventually weaned to 5 L.  She was also started on broad-spectrum antibiotics for pneumonia and a steroid for possible pulmonary vasculitis.  PCCM and nephrology following.  Subjective: Seen and examined earlier this morning.  No major events night of this morning.  Reports significant improvement in her breathing.  No cough this morning.  She denies chest pain or GI symptoms.  Objective: Vitals:   04/17/20 0032 04/17/20 0100 04/17/20 0326 04/17/20 0909  BP: (!) 148/77 (!) 146/81  (!) 133/64  Pulse: 100 102 99 (!) 109  Resp: (!) 35 19 (!) 28   Temp: 98.3 F (36.8 C)  98 F (36.7 C)   TempSrc:   Oral   SpO2:  90%    Weight:        Intake/Output Summary (Last 24 hours) at 04/17/2020 1032 Last data filed at 04/17/2020 0900 Gross per 24 hour  Intake 640 ml  Output 3651 ml  Net -3011 ml   Filed Weights   04/16/20 1642  Weight: 60.5 kg    Examination:  GENERAL: No apparent distress.  Nontoxic. HEENT: MMM.  Vision and hearing grossly intact.  NECK: Supple.  No apparent  JVD.  RESP: 90% on 2 L by [place].  Some IWOB.  Fair aeration with rhonchi bilaterally. CVS:  RRR. Heart sounds normal.  ABD/GI/GU: BS+. Abd soft, NTND.  MSK/EXT:  Moves extremities. No apparent deformity. No edema.  SKIN: no apparent skin lesion or wound NEURO: Awake, alert and oriented appropriately.  No apparent focal neuro deficit. PSYCH: Calm. Normal affect.  Procedures:  None  Microbiology summarized: COVID-19 PCR negative. Respiratory culture pending.  Assessment & Plan: Acute respiratory failure with hypoxia-primarily due to pulmonary edema with possible concomitant pneumonia and vasculitis.  Has some leukocytosis which could be due to steroid.  She has no fever.  Cough with hemoptysis.  CXR concerning for multifocal pneumonia.  High-resolution CT chest with very extensive, dense and somewhat geographic heterogeneous and consolidative airspace disease throughout the lung with moderate right and small left pleural effusion.  Overall, breathing improved after dialysis.  -Treat treatable causes as below -Wean oxygen as able -PCCM and nephrology following.  Pulmonary edema with moderate right pleural effusion: Breathing improved with dialysis. -Plan for another dialysis this evening per nephrology  Hospital-acquired pneumonia?  Patient has some leukocytosis but no fever.  Leukocytosis could be demargination from steroid.  However, radiologic finding concerning but not quite specific -Continue broad-spectrum antibiotics with vancomycin, cefepime and Cipro -Continue home Bactrim for PCP prophylaxis -Follow respiratory cultures. 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 -HD per nephrology -Solu-Medrol as above.  Anemia of renal disease: Hgb 8.1 (about  baseline) -Continue monitoring -ESA and IV iron per nephrology  Uncontrolled hypertension with fluid overload: Improved. -Fluid management with dialysis -Continue home metoprolol with as needed hydralazine  Hypothyroidism -Continue home Synthroid  Secondary hyperparathyroidism -Per nephrology  Hyponatremia/hypokalemia -Per nephrology  Chronic back pain/mood disorder -Continue home amitriptyline     Body mass index is 21.53 kg/m.         DVT prophylaxis:  heparin injection 5,000 Units Start: 04/16/20 1545  Code Status: Full code Family Communication: Patient and/or RN. Available if any question.   Status is: Inpatient  Remains inpatient appropriate because:Hemodynamically unstable, Ongoing diagnostic testing needed not appropriate for outpatient work up, IV treatments appropriate due to intensity of illness or inability to take PO and Inpatient level of care appropriate due to severity of illness   Dispo: The patient is from: Home              Anticipated d/c is to: Home              Anticipated d/c date is: > 3 days              Patient currently is not medically stable to d/c.       Consultants:  Nephrology PCCM   Sch Meds:  Scheduled Meds: . amitriptyline  50 mg Oral QHS  . [START ON 04/18/2020] calcitRIOL  0.25 mcg Oral Q M,W,F-HD  . Chlorhexidine Gluconate Cloth  6 each Topical Q0600  . cholecalciferol  1,000 Units Oral Daily  . heparin  5,000 Units Subcutaneous Q8H  . ipratropium-albuterol  3 mL Nebulization TID  . levothyroxine  88 mcg Oral QAC breakfast  . methylPREDNISolone (SOLU-MEDROL) injection  60 mg Intravenous Daily  . metoprolol succinate  100 mg Oral Daily  . multivitamin  1 tablet Oral QHS  . pantoprazole  40 mg Oral Daily  . potassium chloride  20 mEq Oral Once  . sodium chloride flush  3 mL Intravenous Once  . sulfamethoxazole-trimethoprim  1 tablet Oral Once per day on Mon Wed Fri  . vancomycin variable dose per unstable renal  function (pharmacist dosing)   Does not apply See admin instructions   Continuous Infusions: . ceFEPime (MAXIPIME) IV 1 g (04/16/20 2137)  . ciprofloxacin 400 mg (04/16/20 2228)   PRN Meds:.acetaminophen **OR** acetaminophen, albuterol, heparin, heparin, hydrALAZINE, HYDROcodone-acetaminophen, ondansetron **OR** ondansetron (ZOFRAN) IV  Antimicrobials: Anti-infectives (From admission, onward)   Start     Dose/Rate Route Frequency Ordered Stop   04/17/20 1000  ceFEPIme (MAXIPIME) 1 g in sodium chloride 0.9 % 100 mL IVPB  Status:  Discontinued        1 g 200 mL/hr over 30 Minutes Intravenous Every 24 hours 04/16/20 0914 04/16/20 1858   04/17/20 0000  vancomycin variable dose per unstable renal function (pharmacist dosing)     Discontinue      Does not apply See admin instructions 04/16/20 0914     04/16/20 2000  ceFEPIme (MAXIPIME) 1 g in sodium chloride 0.9 % 100 mL IVPB     Discontinue     1 g 200 mL/hr over 30 Minutes Intravenous Every 24 hours 04/16/20 1858     04/16/20 2000  ciprofloxacin (CIPRO) IVPB 400 mg     Discontinue    Note to Pharmacy: Immunocompromised host   400 mg 200  mL/hr over 60 Minutes Intravenous Every 24 hours 04/16/20 1858     04/16/20 2000  vancomycin (VANCOREADY) IVPB 500 mg/100 mL        500 mg 100 mL/hr over 60 Minutes Intravenous  Once 04/16/20 1858 04/17/20 0121   04/16/20 1800  sulfamethoxazole-trimethoprim (BACTRIM) 400-80 MG per tablet 1 tablet     Discontinue     1 tablet Oral Once per day on Mon Wed Fri 04/16/20 1341     04/16/20 1545  sulfamethoxazole-trimethoprim (BACTRIM) 400-80 MG per tablet 1 tablet  Status:  Discontinued        1 tablet Oral Every M-W-F 04/16/20 1534 04/16/20 1639   04/16/20 1300  ciprofloxacin (CIPRO) IVPB 400 mg  Status:  Discontinued       Note to Pharmacy: Immunocompromised host   400 mg 200 mL/hr over 60 Minutes Intravenous Every 24 hours 04/16/20 1217 04/16/20 1858   04/16/20 1215  ciprofloxacin (CIPRO) IVPB 400 mg   Status:  Discontinued       Note to Pharmacy: Immunocompromised host   400 mg 200 mL/hr over 60 Minutes Intravenous Every 8 hours 04/16/20 1204 04/16/20 1217   04/16/20 0915  vancomycin (VANCOCIN) IVPB 1000 mg/200 mL premix  Status:  Discontinued        1,000 mg 200 mL/hr over 60 Minutes Intravenous  Once 04/16/20 0902 04/16/20 0914   04/16/20 0915  ceFEPIme (MAXIPIME) 2 g in sodium chloride 0.9 % 100 mL IVPB        2 g 200 mL/hr over 30 Minutes Intravenous  Once 04/16/20 0902 04/16/20 0948   04/16/20 0915  vancomycin (VANCOREADY) IVPB 1250 mg/250 mL        1,250 mg 166.7 mL/hr over 90 Minutes Intravenous  Once 04/16/20 0914 04/16/20 1134       I have personally reviewed the following labs and images: CBC: Recent Labs  Lab 04/16/20 0817 04/16/20 1048 04/17/20 0140  WBC 13.8*  --  14.9*  HGB 8.4* 8.8* 8.1*  HCT 26.0* 26.0* 24.8*  MCV 94.5  --  95.4  PLT 197  --  164   BMP &GFR Recent Labs  Lab 04/16/20 0817 04/16/20 1048 04/17/20 0140  NA 131* 131* 131*  K 3.3* 3.4* 4.4  CL 90*  --  95*  CO2 25  --  22  GLUCOSE 109*  --  181*  BUN 74*  --  34*  CREATININE 7.23*  --  4.35*  CALCIUM 7.9*  --  7.5*   Estimated Creatinine Clearance: 10.6 mL/min (A) (by C-G formula based on SCr of 4.35 mg/dL (H)). Liver & Pancreas: Recent Labs  Lab 04/16/20 0817  AST 26  ALT 15  ALKPHOS 55  BILITOT 0.6  PROT 5.8*  ALBUMIN 3.0*   No results for input(s): LIPASE, AMYLASE in the last 168 hours. No results for input(s): AMMONIA in the last 168 hours. Diabetic: No results for input(s): HGBA1C in the last 72 hours. Recent Labs  Lab 04/10/20 1117 04/10/20 1633  GLUCAP 102* 107*   Cardiac Enzymes: No results for input(s): CKTOTAL, CKMB, CKMBINDEX, TROPONINI in the last 168 hours. No results for input(s): PROBNP in the last 8760 hours. Coagulation Profile: No results for input(s): INR, PROTIME in the last 168 hours. Thyroid Function Tests: No results for input(s): TSH,  T4TOTAL, FREET4, T3FREE, THYROIDAB in the last 72 hours. Lipid Profile: No results for input(s): CHOL, HDL, LDLCALC, TRIG, CHOLHDL, LDLDIRECT in the last 72 hours. Anemia Panel: No results for input(s):  VITAMINB12, FOLATE, FERRITIN, TIBC, IRON, RETICCTPCT in the last 72 hours. Urine analysis:    Component Value Date/Time   COLORURINE AMBER (A) 03/18/2020 2143   APPEARANCEUR HAZY (A) 03/18/2020 2143   APPEARANCEUR Clear 03/03/2019 0920   LABSPEC 1.008 03/18/2020 2143   PHURINE 6.0 03/18/2020 2143   GLUCOSEU NEGATIVE 03/18/2020 2143   HGBUR LARGE (A) 03/18/2020 2143   BILIRUBINUR NEGATIVE 03/18/2020 2143   BILIRUBINUR Negative 03/03/2019 0920   KETONESUR NEGATIVE 03/18/2020 2143   PROTEINUR 100 (A) 03/18/2020 2143   UROBILINOGEN negative 12/21/2012 1509   NITRITE NEGATIVE 03/18/2020 2143   LEUKOCYTESUR SMALL (A) 03/18/2020 2143   Sepsis Labs: Invalid input(s): PROCALCITONIN, [place]  Microbiology: Recent Results (from the past 240 hour(s))  SARS Coronavirus 2 by RT PCR (hospital order, performed in [facility] hospital lab) Nasopharyngeal Nasopharyngeal Swab     Status: None   Collection Time: 04/16/20  9:11 AM   Specimen: Nasopharyngeal Swab  Result Value Ref Range Status   SARS Coronavirus 2 NEGATIVE NEGATIVE Final    Comment: (NOTE) SARS-CoV-2 target nucleic acids are NOT DETECTED.  The SARS-CoV-2 RNA is generally detectable in upper and lower respiratory specimens during the acute phase of infection. The lowest concentration of SARS-CoV-2 viral copies this assay can detect is 250 copies / mL. A negative result does not preclude SARS-CoV-2 infection and should not be used as the sole basis for treatment or other patient management decisions.  A negative result may occur with improper specimen collection / handling, submission of specimen other than nasopharyngeal swab, presence of viral mutation(s) within the areas targeted by this assay, and inadequate number of  viral copies (<250 copies / mL). A negative result must be combined with clinical observations, patient history, and epidemiological information.  Fact Sheet for Patients:   StrictlyIdeas.no  Fact Sheet for Healthcare Providers: BankingDealers.co.za  This test is not yet approved or  cleared by the [place] FDA and has been authorized for detection and/or diagnosis of SARS-CoV-2 by FDA under an Emergency Use Authorization (EUA).  This EUA will remain in effect (meaning this test can be used) for the duration of the COVID-19 declaration under Section 564(b)(1) of the Act, 21 U.S.C. section 360bbb-3(b)(1), unless the authorization is terminated or revoked sooner.  Performed at [facility], [place] [address], [place], [place] [address]     Radiology Studies: CT Chest High Resolution  Result Date: 04/16/2020 CLINICAL DATA:  Concern for ILD, severe shortness of breath and cough for several weeks EXAM: CT CHEST WITHOUT CONTRAST TECHNIQUE: Multidetector CT imaging of the chest was performed following the standard protocol without intravenous contrast. High resolution imaging of the lungs, as well as inspiratory and expiratory imaging, was performed. COMPARISON:  CT chest, 04/05/2020 FINDINGS: Cardiovascular: Large-bore right neck multi lumen vascular catheter. Aortic atherosclerosis. Normal heart size. Scattered three-vessel coronary artery calcifications. No pericardial effusion. Mediastinum/Nodes: No enlarged mediastinal, hilar, or axillary lymph nodes. Thyroid gland, trachea, and esophagus demonstrate no significant findings. Lungs/Pleura: Moderate right, small left pleural effusions are slightly decreased compared to prior examination dated 04/05/2020. There is very extensive, dense, somewhat geographic heterogeneous and consolidative airspace disease throughout the lungs, which is significantly worsened compared to prior examination.  There is interlobular septal thickening throughout, which is similar to prior examination. Upper Abdomen: No acute abnormality. Musculoskeletal: No chest wall mass or suspicious bone lesions identified. IMPRESSION: 1. There is very extensive, dense, somewhat geographic heterogeneous and consolidative airspace disease throughout the lungs, which is significantly worsened  compared to prior examination dated 04/05/2020. There is interlobular septal thickening throughout, which is similar to prior examination. Findings are most consistent with worsened multifocal infection, with underlying edema. 2. Moderate right, small left pleural effusions are slightly decreased compared to prior examination dated 04/05/2020. 3. There are no obvious features of interstitial lung disease, however examination is very limited by the presence of extensive acute airspace opacity and pleural effusions. Follow-up examination could be performed at the resolution of acute presentation. 4. Coronary artery disease.  Aortic Atherosclerosis (ICD10-I70.0). Electronically Signed   By: Eddie Candle M.D.   On: 04/16/2020 11:34   45 minutes with more than 50% spent in reviewing records, counseling patient/family and coordinating care.   Tsuyako Jolley T. McFarland  If 7PM-7AM, please contact night-coverage [email or web address] Password [facility] 04/17/2020, 10:32 AM

## 2020-04-17 NOTE — Plan of Care (Signed)

## 2020-04-17 NOTE — Progress Notes (Signed)
RT NOTE: RT unable to do flutter due to pt being off the floor.

## 2020-04-17 NOTE — Progress Notes (Signed)
Back from the hemodialysis by bed awake and alert.

## 2020-04-17 NOTE — Progress Notes (Signed)
Patient ID: Jasmine White, female   DOB: Jan 25, 1946, 74 y.o.   MRN: [id number] [facility] Progress Note   Assessment/ Plan:   1.  Dyspnea/hypoxic respiratory failure: Appears to be from multifocal pneumonia with superimposed pulmonary edema.  Overnight with some symptomatic improvement following hemodialysis yesterday for volume unloading.  I will again order for extra dialysis today for additional volume removal/lowering of dry weight. 2.  Dialysis dependent acute kidney injury: Anuric and without any evidence of renal recovery to date, continue hemodialysis on a Monday/Wednesday/Friday schedule.  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  Incidentally also with a positive antinuclear antibody and antidouble-stranded DNA (likely false positives).  Underwent plasmapheresis and Solu-Medrol last hospitalization and now on prednisone taper and oral cyclophosphamide. 3. Anemia: Hemoglobin and hematocrit drifting downwards, likely secondary to critical illness.  Status post ESA on 7/23.  No overt blood loss including hemoptysis. 4. CKD-MBD: Calcium level at goal when corrected for low albumin.  Continue VDRA for PTH control.  Phosphorus levels pending-not on binders. 5. Nutrition: Continue renal diet with ongoing fluid restriction and renal multivitamin. 6. Hypertension: Blood pressure marginally elevated and should be permissive for ultrafiltration with hemodialysis.  Subjective:   Reports to be feeling better with regards to her shortness of breath.  Awaiting breakfast.   Objective:   BP (!) 146/81   Pulse 99   Temp 98 F (36.7 C) (Oral)   Resp (!) 28   Wt 60.5 kg Comment: standing  SpO2 90%   BMI 21.53 kg/m   Physical Exam: Gen: Appears comfortable sitting up in bed, on oxygen via nasal cannula CVS: Pulse regular rhythm, normal rate, S1 and S2 normal.  Right IJ TDC Resp: Fine rales bilaterally right base greater than left.  No  rhonchi/wheeze Abd: Soft, flat, nontender Ext: 1+ right leg and 2+ left leg edema  Labs: BMET Recent Labs  Lab 04/16/20 0817 04/16/20 1048 04/17/20 0140  NA 131* 131* 131*  K 3.3* 3.4* 4.4  CL 90*  --  95*  CO2 25  --  22  GLUCOSE 109*  --  181*  BUN 74*  --  34*  CREATININE 7.23*  --  4.35*  CALCIUM 7.9*  --  7.5*   CBC Recent Labs  Lab 04/16/20 0817 04/16/20 1048 04/17/20 0140  WBC 13.8*  --  14.9*  HGB 8.4* 8.8* 8.1*  HCT 26.0* 26.0* 24.8*  MCV 94.5  --  95.4  PLT 197  --  164     Medications:    . amitriptyline  50 mg Oral QHS  . [START ON 04/18/2020] calcitRIOL  0.25 mcg Oral Q M,W,F-HD  . Chlorhexidine Gluconate Cloth  6 each Topical Q0600  . cholecalciferol  1,000 Units Oral Daily  . heparin  5,000 Units Subcutaneous Q8H  . levothyroxine  88 mcg Oral QAC breakfast  . methylPREDNISolone (SOLU-MEDROL) injection  60 mg Intravenous Daily  . metoprolol succinate  100 mg Oral Daily  . pantoprazole  40 mg Oral Daily  . potassium chloride  20 mEq Oral Once  . sodium chloride flush  3 mL Intravenous Once  . sulfamethoxazole-trimethoprim  1 tablet Oral Once per day on Mon Wed Fri  . vancomycin variable dose per unstable renal function (pharmacist dosing)   Does not apply See admin instructions   Elmarie Shiley, MD 04/17/2020, 8:24 AM

## 2020-04-17 NOTE — Consult Note (Signed)
   [facility] Inpatient Consult   04/17/2020  Jasmine White [birth year] [id number]   Patient is currently active with [facility] for chronic disease management services.  Patient has been engaged by a [place] RN embedded case Freight forwarder at PCP, [place].     Will continue to follow for progression and disposition plans.  Of note, [facility] Care Management services does not replace or interfere with any services that are needed or arranged by inpatient case management or social work.    Netta Cedars, MSN, [facility] Liaison Nurse Mobile Phone [phone]  Toll free office [phone]

## 2020-04-17 NOTE — Progress Notes (Signed)
Hemodialysis- Tolerated treatment well. UF 2.9 L without issue. Patient currently denies sob. Down to [facility] with sats 99%. Reported off to primary RN.

## 2020-04-17 NOTE — Progress Notes (Signed)
Transported to HD by bed awake and alert. °

## 2020-04-17 NOTE — Progress Notes (Signed)
NAME:  Jasmine White, MRN:  [id number], DOB:  [birth year], LOS: 1 ADMISSION DATE:  04/16/2020, CONSULTATION DATE:  04/16/20 REFERRING MD:  Langston Masker - EM, CHIEF COMPLAINT:  SOB + Hypoxia  Brief History   74 yo F w/ ANCA vasculitis on chronic immunosuppression presents to ED 7/26 with SOB, hypoxia. Oscillating O2 needs between 2L and NRB- for comfort.   History of present illness   74 yo F PMH ANCA vasculitis,renal failure on HD,  pulmonary vasculitis, HTN, HLD, prior COVID infection who presents to ED with SOB. Notably, patient recently admitted 6/26-7/20 with progressive renal failure, respiratory failure. Received plex, cytoxan, steroids, bactrim, requiring intubation 7/15 but successfully extubated 7/18. She presents to ED with SOB which began the night prior to presentation. SOB was worse laying flat and improved when seated. Associated intermittent cough with colorless-to-tan sputum, with some blood tinged streaks. No missed HD sessions, due for HD day of ED presentation. No sick contacts, no fever chills, wheezing, no chest pain, no palpitations, no HA, dizziness.   In ED, became hypoxic when flat during CT chest, required NRB but has been down-tirtated to [employer]. PCCM and nephrology asked to evaluate in ED ED labs: WBC 13.8, Na 131 K 3.3, ABG 7.4/ 39/ 114/ 26/ 0/ 24.9/ 99  Past Medical History  ANCA vasculitis Renal Failure on HD Pulmonary vasculitis  Prior COVID-19 infection  HTN HLD Hypothyroidism  Significant Hospital Events   7/26 Presents to ED with SOB + hypoxia   Consults:  PCCM Nephrology  Procedures:    Significant Diagnostic Tests:  7/26 HRCT> bilateral pleural effusions, R>L. Bilateral ASD, suspicious for PNA and pulmonary edema. Interlobular septal thickening.   Micro Data:  7/26 SARS Cov2> neg 7/26 RVP>> 7/26 Sputum cx>>  Antimicrobials:  7/26 Vanc> 7/26 Cefepime>  7/26 cipro> 7/26 bactrim per pharm>  Interim history/subjective:  Feels much  improved Afebrile No dyspnea or chest pain Examined while on dialysis  Objective   Blood pressure (!) 156/79, pulse 99, temperature 98.7 F (37.1 C), temperature source Oral, resp. rate 22, weight 61.1 kg, SpO2 92 %.        Intake/Output Summary (Last 24 hours) at 04/17/2020 1323 Last data filed at 04/17/2020 0900 Gross per 24 hour  Intake 640 ml  Output 3651 ml  Net -3011 ml   Filed Weights   04/16/20 1642 04/17/20 1204  Weight: 60.5 kg 61.1 kg    Examination: General: Chronically ill appearing older adult F, seated in bed, no distress HEENT: Mild pallor anicteric sclera Lungs: Scattered bilateral crackles, no rhonchi. No accessory muscle use. Cardiovascular: RRR s1s2 no rgm. Cap refill < 3 seconds  Abdomen: soft flat ndnt  Extremities: Trace BLE edema, no obvious joint deformity. No cyanosis Neuro: AAOx4 following commands      Resolved Hospital Problem list     Assessment & Plan:bilateral patchy consolidation with hemoptysis -differential includes acute pulmonary edema>> atypical radiographic appearance, worsening of underlying anti-GBM disease, less likely pneumonia/HAP  she is on Bactrim prophylaxis already so PCP quite unlikely Her rapid improvement on dialysis seems to favor pulmonary edema Hemoptysis is rapidly subsided making flareup of underlying vasculitis less likely     Acute respiratory failure with hypoxia -Multifactorial in setting of acute pulmonary edema + PNA + pleural effusions based on CT  Hx pulmonary vasculitis Improved  with HD and volume removal  P -Continue broad-spectrum antibiotics, repeat chest x-ray 7/28 if clearing of infiltrates will rapidly de-escalate -Sputum cx >> not yet obtained on  7/27 -PCT>> 0.68    ANCA vasculitis  ESRD on HD MWF -no missed HD sessions, making very little urine since last HD 7/23 P -At home is on cytoxan and prednisone  -Continue Solu-Medrol 60 daily  Kara Mead MD. FCCP. [facility]  If no response to pager , please call [phone]   04/17/2020

## 2020-04-17 NOTE — Progress Notes (Signed)
Patient found on 5L [place] through the air flowmeter. Her SpO2 was alarming at 87%. She was given a breathing treatment and then placed on 3L [place] with Oxygen. Her SpO2 is 97%.

## 2020-04-18 ENCOUNTER — Encounter (HOSPITAL_COMMUNITY): Payer: Self-pay | Admitting: Internal Medicine

## 2020-04-18 ENCOUNTER — Inpatient Hospital Stay (HOSPITAL_COMMUNITY): Payer: Medicare PPO

## 2020-04-18 ENCOUNTER — Other Ambulatory Visit: Payer: Self-pay

## 2020-04-18 MED ORDER — HYDROCOD POLST-CPM POLST ER 10-8 MG/5ML PO SUER
5.0000 mL | Freq: Two times a day (BID) | ORAL | Status: DC | PRN
  Administered 2020-04-18: 5 mL via ORAL
  Filled 2020-04-18: qty 5

## 2020-04-18 MED ORDER — VANCOMYCIN HCL IN DEXTROSE 500-5 MG/100ML-% IV SOLN
500.0000 mg | INTRAVENOUS | Status: AC
  Administered 2020-04-18: 500 mg via INTRAVENOUS
  Filled 2020-04-18: qty 100

## 2020-04-18 MED ORDER — ALPRAZOLAM 0.5 MG PO TABS
0.5000 mg | ORAL_TABLET | Freq: Once | ORAL | Status: AC
  Administered 2020-04-18: 0.5 mg via ORAL
  Filled 2020-04-18: qty 1

## 2020-04-18 MED ORDER — IPRATROPIUM-ALBUTEROL 0.5-2.5 (3) MG/3ML IN SOLN
3.0000 mL | RESPIRATORY_TRACT | Status: DC | PRN
  Administered 2020-04-18: 3 mL via RESPIRATORY_TRACT
  Filled 2020-04-18: qty 3

## 2020-04-18 NOTE — Progress Notes (Signed)
Pt started having multiple small episodes of hemoptysis. Pt stated feeling short of breath and that it felt harder to breath. This RN called RT to come give pt a breathing treatment. RN paged PCCM regarding multiple episodes of hemoptysis. Tussionex given to patient. Will continue to monitor.

## 2020-04-18 NOTE — Progress Notes (Signed)
PCCM Interval Progress Note  Called by nursing due to 3 - 4 episodes of minor hemoptysis.  Small volume per RN.  Will order tussionex for cough suppression tonight.  Might need to consider starting Rituxan if persists.  Will ask day team to re-evaluate.   Montey Hora, [facility] 04/18/2020, 10:53 PM

## 2020-04-18 NOTE — Treatment Plan (Signed)
Due to dialysis unit schedule and staffing, will move Jasmine White dialysis treatment to tomorrow. Thereafter (depending on disposition), will run her again on Friday or Saturday and then move her back to her MWF schedule.  Gean Quint, MD [facility]

## 2020-04-18 NOTE — Progress Notes (Signed)
Patient ID: Jasmine White, female   DOB: [birth year], 74 y.o.   MRN: [id number] [facility] Progress Note   Assessment/ Plan:   1.  Dyspnea/hypoxic respiratory failure: Appears to be from multifocal pneumonia with superimposed pulmonary edema.  Continues to show improvement with lowering of dry weight/ultrafiltration and ongoing antibiotics.  If hemoptysis recurs, will give rituximab. 2.  Dialysis dependent acute kidney injury: Anuric and without any evidence of renal recovery to date, hemodialysis today-MWF.  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  Incidentally also with a positive antinuclear antibody and antidouble-stranded DNA (likely false positives).  Underwent plasmapheresis and Solu-Medrol last hospitalization and now on prednisone taper and oral cyclophosphamide. 3. Anemia: Hemoglobin and hematocrit drifting downwards, likely secondary to critical illness.  Status post ESA on 7/23.  No overt blood loss including hemoptysis. 4. CKD-MBD: Calcium level at goal when corrected for low albumin.  Continue VDRA for PTH control.  Phosphorus levels pending-not on binders. 5. Nutrition: Continue renal diet with ongoing fluid restriction and renal multivitamin. 6. Hypertension: Blood pressure remains marginally elevated and will continue UF with HD.  Subjective:   Continues to feel better with regards to her shortness of breath and reports 2 episodes of hemoptysis overnight.   Objective:   BP (!) 159/78 (BP Location: Left Leg)   Pulse 99   Temp 98.1 F (36.7 C) (Oral)   Resp (!) 25   Wt 57.6 kg   SpO2 98%   BMI 20.50 kg/m   Physical Exam: Gen: Comfortably resting in bed, on oxygen via nasal cannula CVS: Pulse regular rhythm, normal rate, S1 and S2 normal.  Right IJ TDC Resp: Fine rales bilaterally (R>L), no wheeze/rhonchi Abd: Soft, flat, nontender Ext: Trace right leg and 1+ left leg edema  Labs: BMET Recent Labs  Lab  04/16/20 0817 04/16/20 1048 04/17/20 0140  NA 131* 131* 131*  K 3.3* 3.4* 4.4  CL 90*  --  95*  CO2 25  --  22  GLUCOSE 109*  --  181*  BUN 74*  --  34*  CREATININE 7.23*  --  4.35*  CALCIUM 7.9*  --  7.5*   CBC Recent Labs  Lab 04/16/20 0817 04/16/20 1048 04/17/20 0140  WBC 13.8*  --  14.9*  HGB 8.4* 8.8* 8.1*  HCT 26.0* 26.0* 24.8*  MCV 94.5  --  95.4  PLT 197  --  164     Medications:    . amitriptyline  50 mg Oral QHS  . calcitRIOL  0.25 mcg Oral Q M,W,F-HD  . Chlorhexidine Gluconate Cloth  6 each Topical Q0600  . cholecalciferol  1,000 Units Oral Daily  . heparin  5,000 Units Subcutaneous Q8H  . levothyroxine  88 mcg Oral QAC breakfast  . methylPREDNISolone (SOLU-MEDROL) injection  60 mg Intravenous Daily  . metoprolol succinate  100 mg Oral Daily  . multivitamin  1 tablet Oral QHS  . pantoprazole  40 mg Oral Daily  . potassium chloride  20 mEq Oral Once  . sodium chloride flush  3 mL Intravenous Once  . sulfamethoxazole-trimethoprim  1 tablet Oral Once per day on Mon Wed Fri  . vancomycin variable dose per unstable renal function (pharmacist dosing)   Does not apply See admin instructions   Elmarie Shiley, MD 04/18/2020, 8:20 AM

## 2020-04-18 NOTE — Plan of Care (Signed)

## 2020-04-18 NOTE — Progress Notes (Signed)
NAME:  Jasmine White, MRN:  [id number], DOB:  1946/03/15, LOS: 2 ADMISSION DATE:  04/16/2020, CONSULTATION DATE:  04/16/20 REFERRING MD:  Langston Masker - EM, CHIEF COMPLAINT:  SOB + Hypoxia  Brief History   74 yo F w/ ANCA vasculitis on chronic immunosuppression presents to ED 7/26 with SOB, hypoxia. Oscillating O2 needs between 2L and NRB- for comfort.   History of present illness   74 yo F PMH ANCA vasculitis,renal failure on HD,  pulmonary vasculitis, HTN, HLD, prior COVID infection who presents to ED with SOB. Notably, patient recently admitted 6/26-7/20 with progressive renal failure, respiratory failure. Received plex, cytoxan, steroids, bactrim, requiring intubation 7/15 but successfully extubated 7/18. She presents to ED with SOB which began the night prior to presentation. SOB was worse laying flat and improved when seated. Associated intermittent cough with colorless-to-tan sputum, with some blood tinged streaks. No missed HD sessions, due for HD day of ED presentation. No sick contacts, no fever chills, wheezing, no chest pain, no palpitations, no HA, dizziness.   In ED, became hypoxic when flat during CT chest, required NRB but has been down-tirtated to [facility]. PCCM and nephrology asked to evaluate in ED ED labs: WBC 13.8, Na 131 K 3.3, ABG 7.4/ 39/ 114/ 26/ 0/ 24.9/ 99  Past Medical History  ANCA vasculitis Renal Failure on HD Pulmonary vasculitis  Prior COVID-19 infection  HTN HLD Hypothyroidism  Significant Hospital Events   7/26 Presents to ED with SOB + hypoxia   Consults:  PCCM Nephrology  Procedures:    Significant Diagnostic Tests:  7/26 HRCT> bilateral pleural effusions, R>L. Bilateral ASD, suspicious for PNA and pulmonary edema. Interlobular septal thickening.   Micro Data:  7/26 SARS Cov2> neg 7/26 RVP > 7/26 Sputum cx> >  Antimicrobials:  7/26 Vanc> 7/26 Cefepime>  7/26 cipro> 7/26 bactrim per pharm>  Interim history/subjective:  Feels much better.  Breathing significantly improved. Hemoptysis improved. 2 episodes overnight. Small amount.   Objective   Blood pressure (!) 136/79, pulse 102, temperature 98.2 F (36.8 C), temperature source Oral, resp. rate 20, weight 57.6 kg, SpO2 99 %.        Intake/Output Summary (Last 24 hours) at 04/18/2020 1139 Last data filed at 04/18/2020 1000 Gross per 24 hour  Intake 590 ml  Output 2991 ml  Net -2401 ml   Filed Weights   04/16/20 1642 04/17/20 1204 04/17/20 1544  Weight: 60.5 kg 61.1 kg 57.6 kg    Examination: General: Elderly appearing female in NAD in bedside chair.  HEENT: [place]/AT, PERRL, no JVD Lungs: Scattered crackles, no distress on 2L [place].  Cardiovascular: RRR s1s2 no rgm. Cap refill < 3 seconds  Abdomen: Soft, non-tender, non-dsitended Extremities: No edema. No deformity. ROM intact.  Neuro: AAOx4 following commands    Resolved Hospital Problem list     Assessment & Plan:bilateral patchy consolidation with hemoptysis -differential includes acute pulmonary edema (atypical radiographic appearance), worsening of underlying anti-GBM disease, less likely pneumonia/HAP  she is on Bactrim prophylaxis already so PCP quite unlikely Her rapid improvement on dialysis seems to favor pulmonary edema Hemoptysis is rapidly subsided making flareup of underlying vasculitis less likely     Acute respiratory failure with hypoxia - Multifactorial in setting of acute pulmonary edema + PNA + pleural effusions based on CT. Hx pulmonary vasculitis. Significantly improved with HD and volume removal  P -Continue antibiotics. CXR today relatively stable from admission, however, clinically she is much improved. Low threshold to narrow -Continued volume removal, for  HD today. -Sputum cx > pending   ANCA vasculitis:  ESRD on HD MWF -no missed HD sessions, making very little urine since last HD 7/23 P - At home is on cytoxan and prednisone  - Continue Solu-Medrol 60 daily   Georgann Housekeeper,  AGACNP-BC [place] for personal pager PCCM on call pager [phone]  04/18/2020 11:48 AM

## 2020-04-18 NOTE — Progress Notes (Signed)
Progress Note    Jasmine White  VOH:[id number] DOB: 1946-07-12  DOA: 04/16/2020 PCP: Chevis Pretty, FNP    Brief Narrative:     Medical records reviewed and are as summarized below:  Jasmine White is an 74 y.o. female with recent hospitalization from 6/26-7/20 for AKI due to ANCA positive RPGN now on HD via right PermCath MWF, HTN, hypothyroidism, HLD and osteoarthritis presenting with dyspnea, orthopnea and hemoptysis and admitted for acute respiratory failure with hypoxia likely due to pulmonary edema, possible pneumonia and vasculitis.  CXR concerning for multifocal pneumonia.  High-resolution CT chest with very extensive, dense and somewhat geographic heterogeneous and consolidative airspace disease throughout the lung with moderate right and small left pleural effusion.  She desaturated to 75% lying down for CT scan requiring NRB.  She received emergent HD with ultrafiltration of 3.6 L and improvement in her breathing.  She was eventually weaned to 5 L.  She was also started on broad-spectrum antibiotics for pneumonia and a steroid for possible pulmonary vasculitis.  PCCM and nephrology following.  Assessment/Plan:   Active Problems:   Essential hypertension, benign   Hypothyroidism   Chronic back pain   Hyponatremia   Acute renal failure (HCC)   Acute respiratory failure with hypoxia (HCC)   Hypokalemia   HAP (hospital-acquired pneumonia)   Acute pulmonary edema (HCC)   Acute respiratory failure with hypoxia -primarily due to pulmonary edema with possible concomitant pneumonia and vasculitis.  Has some leukocytosis which could be due to steroid.   -  hemoptysis (2 small episodes on 7/28 AM-- have asked nursing to document any episodes going forward) - CXR concerning for multifocal pneumonia.  -High-resolution CT chest with very extensive, dense and somewhat geographic heterogeneous and consolidative airspace disease throughout the lung with moderate right and  small left pleural effusion.   -Treat treatable causes as below -Wean oxygen as able  Pulmonary edema with moderate right pleural effusion: Breathing improved with dialysis.  Hospital-acquired pneumonia?  Patient has some leukocytosis but no fever.  Leukocytosis could be demargination from steroid.  However, radiologic finding concerning but not quite specific -Continue broad-spectrum antibiotics with vancomycin, cefepime and Cipro -Continue home Bactrim for PCP prophylaxis -Follow respiratory cultures. 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 -HD per nephrology -Solu-Medrol as above.  Anemia of renal disease: Hgb 8.1 (about baseline) -Continue monitoring -ESA and IV iron per nephrology  Uncontrolled hypertension with fluid overload:  -Fluid management with dialysis -Continue home metoprolol with as needed hydralazine  Hypothyroidism -Continue home Synthroid  Secondary hyperparathyroidism -Per nephrology  Hyponatremia -Per nephrology  Chronic back pain/mood disorder -Continue home amitriptyline    Family Communication/Anticipated D/C date and plan/Code Status   DVT prophylaxis: heparin Code Status: Full Code.  Disposition Plan: Status is: Inpatient  Remains inpatient appropriate because:IV treatments appropriate due to intensity of illness or inability to take PO   Dispo: The patient is from: Home              Anticipated d/c is to: Home              Anticipated d/c date is: > 3 days              Patient currently is not medically stable to d/c.  Medical Consultants:    Renal  PCCM    Subjective:   2 episodes of hemoptysis  Objective:    Vitals:   04/18/20 0404 04/18/20 0728 04/18/20 0836 04/18/20 1105    BP: (!) 134/71 (!) 159/78 (!) 144/70 (!) 136/79  Pulse: 94 99 100 102  Resp: 22 (!) 25 17 20   Temp: 98.2 F (36.8 C) 98.1 F (36.7 C) 97.9 F (36.6 C) 98.2 F (36.8 C)  TempSrc: Oral Oral Oral Oral  SpO2: 95% 98% 97% 99%  Weight:        Intake/Output Summary (Last 24 hours) at 04/18/2020 1314 Last data filed at 04/18/2020 1000 Gross per 24 hour  Intake 590 ml  Output 2991 ml  Net -2401 ml   Filed Weights   04/16/20 1642 04/17/20 1204 04/17/20 1544  Weight: 60.5 kg 61.1 kg 57.6 kg    Exam:  General: Appearance:    Well developed, well nourished female in no acute distress     Lungs:     Scattered crackles  Heart:    Tachycardic. Normal rhythm. No murmurs, rubs, or gallops.   MS:   All extremities are intact.   Neurologic:   Awake, alert, oriented x 3. No apparent focal neurological           defect.     Data Reviewed:   I have personally reviewed following labs and imaging studies:  Labs: Labs show the following:   Basic Metabolic Panel: Recent Labs  Lab 04/16/20 0817 04/16/20 0817 04/16/20 1048 04/17/20 0140  NA 131*  --  131* 131*  K 3.3*   < > 3.4* 4.4  CL 90*  --   --  95*  CO2 25  --   --  22  GLUCOSE 109*  --   --  181*  BUN 74*  --   --  34*  CREATININE 7.23*  --   --  4.35*  CALCIUM 7.9*  --   --  7.5*   < > = values in this interval not displayed.   GFR Estimated Creatinine Clearance: 10.3 mL/min (A) (by C-G formula based on SCr of 4.35 mg/dL (H)). Liver Function Tests: Recent Labs  Lab 04/16/20 0817  AST 26  ALT 15  ALKPHOS 55  BILITOT 0.6  PROT 5.8*  ALBUMIN 3.0*   No results for input(s): LIPASE, AMYLASE in the last 168 hours. No results for input(s): AMMONIA in the last 168 hours. Coagulation profile No results for input(s): INR, PROTIME in the last 168 hours.  CBC: Recent Labs  Lab 04/16/20 0817 04/16/20 1048 04/17/20 0140  WBC 13.8*  --  14.9*  HGB 8.4* 8.8* 8.1*  HCT 26.0* 26.0* 24.8*  MCV 94.5  --  95.4  PLT 197   --  164   Cardiac Enzymes: No results for input(s): CKTOTAL, CKMB, CKMBINDEX, TROPONINI in the last 168 hours. BNP (last 3 results) No results for input(s): PROBNP in the last 8760 hours. CBG: No results for input(s): GLUCAP in the last 168 hours. D-Dimer: No results for input(s): DDIMER in the last 72 hours. Hgb A1c: No results for input(s): HGBA1C in the last 72 hours. Lipid Profile: No results for input(s): CHOL, HDL, LDLCALC, TRIG, CHOLHDL, LDLDIRECT in the last 72 hours. Thyroid function studies: No results for input(s): TSH, T4TOTAL, T3FREE, THYROIDAB in the last 72 hours.  Invalid input(s): FREET3 Anemia work up: No results for input(s): VITAMINB12, FOLATE, FERRITIN, TIBC, IRON, RETICCTPCT in the last 72 hours. Sepsis  Labs: Recent Labs  Lab 04/16/20 0817 04/16/20 1644 04/17/20 0140  PROCALCITON  --  0.68  --   WBC 13.8*  --  14.9*    Microbiology Recent Results (from the past 240 hour(s))  SARS Coronavirus 2 by RT PCR (hospital order, performed in [facility] hospital lab) Nasopharyngeal Nasopharyngeal Swab     Status: None   Collection Time: 04/16/20  9:11 AM   Specimen: Nasopharyngeal Swab  Result Value Ref Range Status   SARS Coronavirus 2 NEGATIVE NEGATIVE Final    Comment: (NOTE) SARS-CoV-2 target nucleic acids are NOT DETECTED.  The SARS-CoV-2 RNA is generally detectable in upper and lower respiratory specimens during the acute phase of infection. The lowest concentration of SARS-CoV-2 viral copies this assay can detect is 250 copies / mL. A negative result does not preclude SARS-CoV-2 infection and should not be used as the sole basis for treatment or other patient management decisions.  A negative result may occur with improper specimen collection / handling, submission of specimen other than nasopharyngeal swab, presence of viral mutation(s) within the areas targeted by this assay, and inadequate number of viral copies (<250 copies / mL). A negative  result must be combined with clinical observations, patient history, and epidemiological information.  Fact Sheet for Patients:   StrictlyIdeas.no  Fact Sheet for Healthcare Providers: BankingDealers.co.za  This test is not yet approved or  cleared by the [place] FDA and has been authorized for detection and/or diagnosis of SARS-CoV-2 by FDA under an Emergency Use Authorization (EUA).  This EUA will remain in effect (meaning this test can be used) for the duration of the COVID-19 declaration under Section 564(b)(1) of the Act, 21 U.S.C. section 360bbb-3(b)(1), unless the authorization is terminated or revoked sooner.  Performed at [facility], [place] [address] [place], [place] [address]     Procedures and diagnostic studies:  DG Chest 2 View  Result Date: 04/18/2020 CLINICAL DATA:  Chest pain and shortness of breath EXAM: CHEST - 2 VIEW COMPARISON:  April 16, 2020 chest radiograph and chest CT FINDINGS: Widespread multifocal airspace opacity persists. No new opacities are evident. Heart size and pulmonary vascularity are normal. No adenopathy. Central catheter tip is in the superior vena cava near the cavoatrial junction. No pneumothorax. There is mild anterior wedging of the L1 vertebral body. IMPRESSION: Multifocal airspace opacity, likely multifocal pneumonia. No new opacity evident. Stable cardiac silhouette. Central catheter as described. No evident pneumothorax. Electronically Signed   By: Lowella Grip III M.D.   On: 04/18/2020 08:24    Medications:   . amitriptyline  50 mg Oral QHS  . calcitRIOL  0.25 mcg Oral Q M,W,F-HD  . Chlorhexidine Gluconate Cloth  6 each Topical Q0600  . cholecalciferol  1,000 Units Oral Daily  . heparin  5,000 Units Subcutaneous Q8H  . levothyroxine  88 mcg Oral QAC breakfast  . methylPREDNISolone (SOLU-MEDROL) injection  60 mg Intravenous Daily  . metoprolol succinate  100 mg Oral  Daily  . multivitamin  1 tablet Oral QHS  . pantoprazole  40 mg Oral Daily  . potassium chloride  20 mEq Oral Once  . sodium chloride flush  3 mL Intravenous Once  . sulfamethoxazole-trimethoprim  1 tablet Oral Once per day on Mon Wed Fri  . vancomycin  500 mg Intravenous Q M,W,F-HD  . vancomycin variable dose per unstable renal function (pharmacist dosing)   Does not apply See admin instructions   Continuous Infusions: . ceFEPime (MAXIPIME) IV 1  g (04/17/20 1954)  . ciprofloxacin 400 mg (04/17/20 2046)     LOS: 2 days   Geradine Girt  [facility]   How to contact the [facility] Attending or Consulting provider [place] or covering provider during after hours [place], for this patient?  1. Check the care team in [facility] and look for a) attending/consulting [facility] provider listed and b) the [facility] team listed 2. Log into [email or web address] and use [place]'s universal password to access. If you do not have the password, please contact the hospital operator. 3. Locate the [facility] provider you are looking for under [facility] and page to a number that you can be directly reached. 4. If you still have difficulty reaching the provider, please page the [facility] (Director on Call) for the Hospitalists listed on amion for assistance.  04/18/2020, 1:14 PM

## 2020-04-18 NOTE — Progress Notes (Signed)
Pharmacy Antibiotic Note  Jasmine White is a 74 y.o. female admitted on 04/16/2020 with pneumonia.  Patient is ESRD receiving HD on MWF prior to admit. Extra session received 7/27 and going back to HD today on schedule.   Tmax 99.5, wbc 14 from yesterday. Cultures show as sent.   Weight: 57.6 kg (126 lb 15.8 oz)  Temp (24hrs), Avg:98.4 F (36.9 C), Min:97.8 F (36.6 C), Max:99.5 F (37.5 C)  Recent Labs  Lab 04/16/20 0817 04/17/20 0140  WBC 13.8* 14.9*  CREATININE 7.23* 4.35*    Estimated Creatinine Clearance: 10.3 mL/min (A) (by C-G formula based on SCr of 4.35 mg/dL (H)).    Allergies  Allergen Reactions  . Hctz [Hydrochlorothiazide] Other (See Comments)    Hx of severe Hyponatremia while taking HCTZ - now contraindicated  . Percocet [Oxycodone-Acetaminophen] Nausea Only  . Ultram [Tramadol] Nausea Only    Makes her crazy and nauseated   Plan: Vancomycin 500 mg x 1 with HD today - when HD on set schedule will place standing vancomycin dose.  Consider level over weekend if needed.  Continue cefepime 1 g q24h to be given daily at 2000 (to attempt to be after HD) Ciprofloxacin to 400 mg q24h to be given daily at 2000 (to attempt to be after HD) F/u HD schedule daily for further vanc  Erin Hearing PharmD., BCPS Clinical Pharmacist 04/18/2020 12:06 PM  Please check AMION for all [place] numbers

## 2020-04-18 NOTE — Progress Notes (Signed)
HD orders modified for 04/19/2020 by Dr Candiss Norse, Notified [place]  Truman Hayward, RN of above

## 2020-04-18 NOTE — Plan of Care (Signed)
  Problem: Education: Goal: Knowledge of General Education information will improve Description: Including pain rating scale, medication(s)/side effects and non-pharmacologic comfort measures Outcome: Progressing   Problem: Health Behavior/Discharge Planning: Goal: Ability to manage health-related needs will improve Outcome: Progressing   Problem: Clinical Measurements: Goal: Ability to maintain clinical measurements within normal limits will improve Outcome: Progressing   Problem: Clinical Measurements: Goal: Diagnostic test results will improve Outcome: Progressing   Problem: Clinical Measurements: Goal: Respiratory complications will improve Outcome: Progressing   Problem: Activity: Goal: Risk for activity intolerance will decrease Outcome: Progressing   Problem: Nutrition: Goal: Adequate nutrition will be maintained Outcome: Progressing   Problem: Pain Managment: Goal: General experience of comfort will improve Outcome: Progressing

## 2020-04-19 ENCOUNTER — Other Ambulatory Visit: Payer: Self-pay

## 2020-04-19 ENCOUNTER — Inpatient Hospital Stay (HOSPITAL_COMMUNITY): Payer: Medicare PPO

## 2020-04-19 DIAGNOSIS — M310 Hypersensitivity angiitis: Secondary | ICD-10-CM | POA: Diagnosis not present

## 2020-04-19 DIAGNOSIS — I776 Arteritis, unspecified: Secondary | ICD-10-CM | POA: Diagnosis not present

## 2020-04-19 DIAGNOSIS — N179 Acute kidney failure, unspecified: Secondary | ICD-10-CM

## 2020-04-19 DIAGNOSIS — N186 End stage renal disease: Secondary | ICD-10-CM

## 2020-04-19 LAB — RENAL FUNCTION PANEL
Albumin: 2.6 g/dL — ABNORMAL LOW (ref 3.5–5.0)
Anion gap: 14 (ref 5–15)
BUN: 50 mg/dL — ABNORMAL HIGH (ref 8–23)
CO2: 23 mmol/L (ref 22–32)
Calcium: 8.7 mg/dL — ABNORMAL LOW (ref 8.9–10.3)
Chloride: 96 mmol/L — ABNORMAL LOW (ref 98–111)
Creatinine, Ser: 5.63 mg/dL — ABNORMAL HIGH (ref 0.44–1.00)
GFR calc Af Amer: 8 mL/min — ABNORMAL LOW (ref >60)
GFR calc non Af Amer: 7 mL/min — ABNORMAL LOW (ref >60)
Glucose, Bld: 139 mg/dL — ABNORMAL HIGH (ref 70–99)
Phosphorus: 5.6 mg/dL — ABNORMAL HIGH (ref 2.5–4.6)
Potassium: 4.1 mmol/L (ref 3.5–5.1)
Sodium: 133 mmol/L — ABNORMAL LOW (ref 135–145)

## 2020-04-19 LAB — CBC WITH DIFFERENTIAL/PLATELET
Abs Immature Granulocytes: 0.19 10*3/uL — ABNORMAL HIGH (ref 0.00–0.07)
Basophils Absolute: 0 10*3/uL (ref 0.0–0.1)
Basophils Relative: 0 %
Eosinophils Absolute: 0 10*3/uL (ref 0.0–0.5)
Eosinophils Relative: 0 %
HCT: 24.8 % — ABNORMAL LOW (ref 36.0–46.0)
Hemoglobin: 8 g/dL — ABNORMAL LOW (ref 12.0–15.0)
Immature Granulocytes: 1 %
Lymphocytes Relative: 3 %
Lymphs Abs: 0.6 10*3/uL — ABNORMAL LOW (ref 0.7–4.0)
MCH: 30.7 pg (ref 26.0–34.0)
MCHC: 32.3 g/dL (ref 30.0–36.0)
MCV: 95 fL (ref 80.0–100.0)
Monocytes Absolute: 0.5 10*3/uL (ref 0.1–1.0)
Monocytes Relative: 3 %
Neutro Abs: 16.2 10*3/uL — ABNORMAL HIGH (ref 1.7–7.7)
Neutrophils Relative %: 93 %
Platelets: 169 10*3/uL (ref 150–400)
RBC: 2.61 MIL/uL — ABNORMAL LOW (ref 3.87–5.11)
RDW: 19.9 % — ABNORMAL HIGH (ref 11.5–15.5)
WBC: 17.6 10*3/uL — ABNORMAL HIGH (ref 4.0–10.5)
nRBC: 0 % (ref 0.0–0.2)

## 2020-04-19 LAB — COMPREHENSIVE METABOLIC PANEL
ALT: 12 U/L (ref 0–44)
AST: 26 U/L (ref 15–41)
Albumin: 2.6 g/dL — ABNORMAL LOW (ref 3.5–5.0)
Alkaline Phosphatase: 64 U/L (ref 38–126)
Anion gap: 16 — ABNORMAL HIGH (ref 5–15)
BUN: 51 mg/dL — ABNORMAL HIGH (ref 8–23)
CO2: 21 mmol/L — ABNORMAL LOW (ref 22–32)
Calcium: 8.7 mg/dL — ABNORMAL LOW (ref 8.9–10.3)
Chloride: 95 mmol/L — ABNORMAL LOW (ref 98–111)
Creatinine, Ser: 5.51 mg/dL — ABNORMAL HIGH (ref 0.44–1.00)
GFR calc Af Amer: 8 mL/min — ABNORMAL LOW (ref >60)
GFR calc non Af Amer: 7 mL/min — ABNORMAL LOW (ref >60)
Glucose, Bld: 128 mg/dL — ABNORMAL HIGH (ref 70–99)
Potassium: 4.3 mmol/L (ref 3.5–5.1)
Sodium: 132 mmol/L — ABNORMAL LOW (ref 135–145)
Total Bilirubin: 0.9 mg/dL (ref 0.3–1.2)
Total Protein: 5.7 g/dL — ABNORMAL LOW (ref 6.5–8.1)

## 2020-04-19 LAB — TROPONIN I (HIGH SENSITIVITY): Troponin I (High Sensitivity): 58 ng/L — ABNORMAL HIGH (ref <18)

## 2020-04-19 MED ORDER — ANTICOAGULANT SODIUM CITRATE 4% (200MG/5ML) IV SOLN
5.0000 mL | Freq: Every day | Status: DC | PRN
  Administered 2020-04-19 – 2020-05-02 (×5): 5 mL
  Filled 2020-04-19 (×9): qty 5

## 2020-04-19 MED ORDER — DIPHENHYDRAMINE HCL 25 MG PO CAPS
50.0000 mg | ORAL_CAPSULE | Freq: Once | ORAL | Status: AC
  Administered 2020-04-19: 50 mg via ORAL
  Filled 2020-04-19: qty 2

## 2020-04-19 MED ORDER — VANCOMYCIN HCL IN DEXTROSE 500-5 MG/100ML-% IV SOLN: 500.0000 mg | INTRAVENOUS | Status: DC | PRN

## 2020-04-19 MED ORDER — DIPHENHYDRAMINE HCL 50 MG/ML IJ SOLN: 50.0000 mg | Freq: Once | INTRAMUSCULAR | Status: DC | PRN

## 2020-04-19 MED ORDER — METHYLPREDNISOLONE SODIUM SUCC 125 MG IJ SOLR: 125.0000 mg | Freq: Once | INTRAMUSCULAR | Status: DC | PRN

## 2020-04-19 MED ORDER — SODIUM CHLORIDE 0.9 % IV SOLN: 100.0000 mL | INTRAVENOUS | Status: DC | PRN

## 2020-04-19 MED ORDER — ACETAMINOPHEN 325 MG PO TABS
650.0000 mg | ORAL_TABLET | Freq: Once | ORAL | Status: AC
  Administered 2020-04-19: 650 mg via ORAL
  Filled 2020-04-19: qty 2

## 2020-04-19 MED ORDER — ALTEPLASE 2 MG IJ SOLR: 2.0000 mg | Freq: Once | INTRAMUSCULAR | Status: DC | PRN

## 2020-04-19 MED ORDER — SODIUM CHLORIDE 0.9 % IV SOLN
1000.0000 mg | Freq: Once | INTRAVENOUS | Status: AC
  Administered 2020-04-19: 1000 mg via INTRAVENOUS
  Filled 2020-04-19: qty 100

## 2020-04-19 MED ORDER — LIDOCAINE-PRILOCAINE 2.5-2.5 % EX CREA: 1.0000 "application " | TOPICAL_CREAM | CUTANEOUS | Status: DC | PRN

## 2020-04-19 MED ORDER — VANCOMYCIN HCL IN DEXTROSE 500-5 MG/100ML-% IV SOLN: 500.0000 mg | Freq: Once | INTRAVENOUS | Status: DC

## 2020-04-19 MED ORDER — EPINEPHRINE PF 1 MG/ML IJ SOLN: 0.3000 mg | INTRAMUSCULAR | Status: DC | PRN

## 2020-04-19 MED ORDER — HEPARIN SODIUM (PORCINE) 1000 UNIT/ML DIALYSIS: 1000.0000 [IU] | INTRAMUSCULAR | Status: DC | PRN

## 2020-04-19 MED ORDER — LIDOCAINE HCL (PF) 1 % IJ SOLN: 5.0000 mL | INTRAMUSCULAR | Status: DC | PRN

## 2020-04-19 MED ORDER — PENTAFLUOROPROP-TETRAFLUOROETH EX AERO: 1.0000 "application " | INHALATION_SPRAY | CUTANEOUS | Status: DC | PRN

## 2020-04-19 MED ORDER — VANCOMYCIN HCL IN DEXTROSE 500-5 MG/100ML-% IV SOLN
500.0000 mg | Freq: Once | INTRAVENOUS | Status: DC
  Filled 2020-04-19: qty 100

## 2020-04-19 MED ORDER — FAMOTIDINE IN NACL 20-0.9 MG/50ML-% IV SOLN
20.0000 mg | Freq: Once | INTRAVENOUS | Status: DC | PRN
  Filled 2020-04-19: qty 50

## 2020-04-19 MED ORDER — SODIUM CHLORIDE 0.9 % IV BOLUS: 1000.0000 mL | Freq: Once | INTRAVENOUS | Status: DC | PRN

## 2020-04-19 MED ORDER — ALBUTEROL SULFATE (2.5 MG/3ML) 0.083% IN NEBU: 2.5000 mg | INHALATION_SOLUTION | Freq: Once | RESPIRATORY_TRACT | Status: DC | PRN

## 2020-04-19 MED ORDER — SODIUM CHLORIDE 0.9 % IV SOLN
500.0000 mg | INTRAVENOUS | Status: AC
  Administered 2020-04-19 – 2020-04-21 (×3): 500 mg via INTRAVENOUS
  Filled 2020-04-19 (×3): qty 4

## 2020-04-19 MED ORDER — ZOLPIDEM TARTRATE 5 MG PO TABS: 5.0000 mg | ORAL_TABLET | Freq: Every evening | ORAL | Status: DC | PRN

## 2020-04-19 NOTE — Progress Notes (Signed)
Progress Note    BRITANI BEATTIE  ZWC:[id number] DOB: 1945-10-22  DOA: 04/16/2020 PCP: Chevis Pretty, FNP    Brief Narrative:     Medical records reviewed and are as summarized below:  ALLYN BARTELSON is an 74 y.o. female with recent hospitalization from 6/26-7/20 for AKI due to ANCA positive RPGN now on HD via right PermCath MWF, HTN, hypothyroidism, HLD and osteoarthritis presenting with dyspnea, orthopnea and hemoptysis and admitted for acute respiratory failure with hypoxia likely due to pulmonary edema, possible pneumonia and vasculitis.  CXR concerning for multifocal pneumonia.  High-resolution CT chest with very extensive, dense and somewhat geographic heterogeneous and consolidative airspace disease throughout the lung with moderate right and small left pleural effusion.  She desaturated to 75% lying down for CT scan requiring NRB.  She received emergent HD with ultrafiltration of 3.6 L and improvement in her breathing.  She was eventually weaned to 5 L.  She was also started on broad-spectrum antibiotics for pneumonia and a steroid for possible pulmonary vasculitis.  PCCM and nephrology following and appreciate their help with this complicated patient.   Assessment/Plan:   Active Problems:   Essential hypertension, benign   Hypothyroidism   Chronic back pain   Hyponatremia   Acute renal failure (HCC)   Acute respiratory failure with hypoxia (HCC)   Hypokalemia   HAP (hospital-acquired pneumonia)   Acute pulmonary edema (HCC)   Acute respiratory failure with hypoxia -primarily due to pulmonary edema with possible concomitant pneumonia and vasculitis.  Has some leukocytosis which could be due to steroid.   - hemoptysis - CXR concerning for multifocal pneumonia.  -High-resolution CT chest with very extensive, dense and somewhat geographic heterogeneous and consolidative airspace disease throughout the lung with moderate right and small left pleural effusion.     -worsening O2 status and hemoptysis overnight 7/28-7/29- plan to do pulse steroids, ritux, and plasmaphoresis  Pulmonary edema with moderate right pleural effusion:  -continue HD for volume removal as she makes very little to no urine  Hospital-acquired pneumonia?  Patient has some leukocytosis but no fever.  Leukocytosis could be demargination from steroid.  However, radiologic finding concerning but not quite specific -Continue broad-spectrum antibiotics with vancomycin, cefepime and Cipro -Continue home Bactrim for PCP prophylaxis -Follow respiratory cultures. 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 -HD per nephrology -Solu-Medrol as above.  Anemia of renal disease: Hgb 8.1 (about baseline) -Continue monitoring -ESA and IV iron per nephrology  Uncontrolled hypertension with fluid overload:  -Fluid management with dialysis -Continue home metoprolol with as needed hydralazine  Hypothyroidism -Continue home Synthroid  Secondary hyperparathyroidism -Per nephrology  Hyponatremia -Per nephrology  Chronic back pain/mood disorder -Continue home amitriptyline    Family Communication/Anticipated D/C date and plan/Code Status   DVT prophylaxis: heparin on hold due to hemoptysis- use SCDs for now Code Status: Full Code.  Disposition Plan: Status is: Inpatient  Remains inpatient appropriate because:IV treatments appropriate due to intensity of illness or inability to take PO   Dispo: The patient is from: Home              Anticipated d/c is to: Home              Anticipated d/c date is: > 3 days  Patient currently is not medically stable to d/c.         Medical Consultants:    Renal  PCCM    Subjective:   Overnight had  worsening of her breathing/hemoptysis  Objective:    Vitals:   04/19/20 1145 04/19/20 1150 04/19/20 1300 04/19/20 1303  BP: (!) 140/79 (!) 154/96  (!) 146/72  Pulse: 101 95 (!) 106 (!) 113  Resp:  (!) 28 17   Temp:  98.8 F (37.1 C)  97.7 F (36.5 C)  TempSrc:  Oral  Oral  SpO2:  100% 96%   Weight:  57.4 kg      Intake/Output Summary (Last 24 hours) at 04/19/2020 1609 Last data filed at 04/19/2020 1150 Gross per 24 hour  Intake 640 ml  Output 3000 ml  Net -2360 ml   Filed Weights   04/19/20 0527 04/19/20 0745 04/19/20 1150  Weight: 61.2 kg 60.4 kg 57.4 kg    Exam:   General: Appearance:    Patient appears markedly different from yesterday-- increased work of breathing, fatigued and anxious     Lungs:     Crackles RUL > others  Heart:    Tachycardic. Normal rhythm. No murmurs, rubs, or gallops.   MS:   All extremities are intact.   Neurologic:   Awake, alert, orientedx3. Sleepier than prior   Data Reviewed:   I have personally reviewed following labs and imaging studies:  Labs: Labs show the following:   Basic Metabolic Panel: Recent Labs  Lab 04/16/20 0817 04/16/20 0817 04/16/20 1048 04/16/20 1048 04/17/20 0140 04/17/20 0140 04/19/20 0620 04/19/20 0816  NA 131*  --  131*  --  131*  --  132* 133*  K 3.3*   < > 3.4*   < > 4.4   < > 4.3 4.1  CL 90*  --   --   --  95*  --  95* 96*  CO2 25  --   --   --  22  --  21* 23  GLUCOSE 109*  --   --   --  181*  --  128* 139*  BUN 74*  --   --   --  34*  --  51* 50*  CREATININE 7.23*  --   --   --  4.35*  --  5.51* 5.63*  CALCIUM 7.9*  --   --   --  7.5*  --  8.7* 8.7*  PHOS  --   --   --   --   --   --   --  5.6*   < > = values in this interval not displayed.   GFR Estimated Creatinine Clearance: 7.9 mL/min (A) (by C-G formula based on SCr of 5.63 mg/dL (H)). Liver Function Tests: Recent Labs  Lab 04/16/20 0817 04/19/20 0620 04/19/20 0816  AST 26 26  --   ALT 15 12  --   ALKPHOS 55 64  --   BILITOT 0.6  0.9  --   PROT 5.8* 5.7*  --   ALBUMIN 3.0* 2.6* 2.6*   No results for input(s): LIPASE, AMYLASE in the last 168 hours. No results for input(s): AMMONIA in the last 168 hours. Coagulation profile No results for input(s): INR, PROTIME in the last 168 hours.  CBC: Recent Labs  Lab 04/16/20 0817 04/16/20 1048 04/17/20 0140 04/19/20 0620  WBC 13.8*  --  14.9* 17.6*  NEUTROABS  --   --   --  16.2*  HGB  8.4* 8.8* 8.1* 8.0*  HCT 26.0* 26.0* 24.8* 24.8*  MCV 94.5  --  95.4 95.0  PLT 197  --  164 169   Cardiac Enzymes: No results for input(s): CKTOTAL, CKMB, CKMBINDEX, TROPONINI in the last 168 hours. BNP (last 3 results) No results for input(s): PROBNP in the last 8760 hours. CBG: No results for input(s): GLUCAP in the last 168 hours. D-Dimer: No results for input(s): DDIMER in the last 72 hours. Hgb A1c: No results for input(s): HGBA1C in the last 72 hours. Lipid Profile: No results for input(s): CHOL, HDL, LDLCALC, TRIG, CHOLHDL, LDLDIRECT in the last 72 hours. Thyroid function studies: No results for input(s): TSH, T4TOTAL, T3FREE, THYROIDAB in the last 72 hours.  Invalid input(s): FREET3 Anemia work up: No results for input(s): VITAMINB12, FOLATE, FERRITIN, TIBC, IRON, RETICCTPCT in the last 72 hours. Sepsis Labs: Recent Labs  Lab 04/16/20 0817 04/16/20 1644 04/17/20 0140 04/19/20 0620  PROCALCITON  --  0.68  --   --   WBC 13.8*  --  14.9* 17.6*    Microbiology Recent Results (from the past 240 hour(s))  SARS Coronavirus 2 by RT PCR (hospital order, performed in [facility] hospital lab) Nasopharyngeal Nasopharyngeal Swab     Status: None   Collection Time: 04/16/20  9:11 AM   Specimen: Nasopharyngeal Swab  Result Value Ref Range Status   SARS Coronavirus 2 NEGATIVE NEGATIVE Final    Comment: (NOTE) SARS-CoV-2 target nucleic acids are NOT DETECTED.  The SARS-CoV-2 RNA is generally detectable in upper and lower respiratory specimens during the acute phase of  infection. The lowest concentration of SARS-CoV-2 viral copies this assay can detect is 250 copies / mL. A negative result does not preclude SARS-CoV-2 infection and should not be used as the sole basis for treatment or other patient management decisions.  A negative result may occur with improper specimen collection / handling, submission of specimen other than nasopharyngeal swab, presence of viral mutation(s) within the areas targeted by this assay, and inadequate number of viral copies (<250 copies / mL). A negative result must be combined with clinical observations, patient history, and epidemiological information.  Fact Sheet for Patients:   StrictlyIdeas.no  Fact Sheet for Healthcare Providers: BankingDealers.co.za  This test is not yet approved or  cleared by the [place] FDA and has been authorized for detection and/or diagnosis of SARS-CoV-2 by FDA under an Emergency Use Authorization (EUA).  This EUA will remain in effect (meaning this test can be used) for the duration of the COVID-19 declaration under Section 564(b)(1) of the Act, 21 U.S.C. section 360bbb-3(b)(1), unless the authorization is terminated or revoked sooner.  Performed at [facility], [place] [address] [place], [place] [address]     Procedures and diagnostic studies:  DG Chest 1 View  Result Date: 04/19/2020 CLINICAL DATA:  Shortness of breath EXAM: CHEST  1 VIEW COMPARISON:  04/18/2020. FINDINGS: Dual-lumen catheter with tip over SVC in stable position. Heart size stable. Progressive severe diffuse bilateral pulmonary infiltrates/edema. No prominent pleural effusion. No pneumothorax. IMPRESSION: 1.  Dual-lumen catheter noted with tip over SVC in stable position. 2. Progressive severe diffuse bilateral pulmonary infiltrates/edema. Electronically Signed   By: Marcello Moores  Register   On: 04/19/2020 07:46   DG Chest 2 View  Result Date: 04/18/2020 CLINICAL  DATA:  Chest pain and shortness of breath EXAM: CHEST - 2 VIEW COMPARISON:  April 16, 2020 chest radiograph and chest CT FINDINGS: Widespread multifocal airspace opacity persists. No new  opacities are evident. Heart size and pulmonary vascularity are normal. No adenopathy. Central catheter tip is in the superior vena cava near the cavoatrial junction. No pneumothorax. There is mild anterior wedging of the L1 vertebral body. IMPRESSION: Multifocal airspace opacity, likely multifocal pneumonia. No new opacity evident. Stable cardiac silhouette. Central catheter as described. No evident pneumothorax. Electronically Signed   By: Lowella Grip III M.D.   On: 04/18/2020 08:24    Medications:   . amitriptyline  50 mg Oral QHS  . calcitRIOL  0.25 mcg Oral Q M,W,F-HD  . Chlorhexidine Gluconate Cloth  6 each Topical Q0600  . cholecalciferol  1,000 Units Oral Daily  . heparin  5,000 Units Subcutaneous Q8H  . levothyroxine  88 mcg Oral QAC breakfast  . metoprolol succinate  100 mg Oral Daily  . multivitamin  1 tablet Oral QHS  . pantoprazole  40 mg Oral Daily  . potassium chloride  20 mEq Oral Once  . sodium chloride flush  3 mL Intravenous Once  . sulfamethoxazole-trimethoprim  1 tablet Oral Once per day on Mon Wed Fri  . [START ON 04/20/2020] vancomycin  500 mg Intravenous Once  . vancomycin variable dose per unstable renal function (pharmacist dosing)   Does not apply See admin instructions   Continuous Infusions: . anticoagulant sodium citrate    . ceFEPime (MAXIPIME) IV 1 g (04/18/20 2005)  . famotidine (PEPCID) IV    . methylPREDNISolone (SOLU-MEDROL) injection    . riTUXimab (RITUXAN)  NON-ONCOLOGY  infusion    . sodium chloride       LOS: 3 days   Geradine Girt  [facility]   How to contact the [facility] Attending or Consulting provider [place] or covering provider during after hours [place], for this patient?  1. Check the care team in [facility] and look for a) attending/consulting TR[facility]  provider listed and b) the [facility] team listed 2. Log into [email or web address] and use [place]'s universal password to access. If you do not have the password, please contact the hospital operator. 3. Locate the [facility] provider you are looking for under [facility] and page to a number that you can be directly reached. 4. If you still have difficulty reaching the provider, please page the [facility] (Director on Call) for the Hospitalists listed on amion for assistance.  04/19/2020, 4:09 PM

## 2020-04-19 NOTE — Progress Notes (Signed)
NAME:  LONEY PETO, MRN:  [id number], DOB:  1945-12-02, LOS: 3 ADMISSION DATE:  04/16/2020, CONSULTATION DATE:  04/16/20 REFERRING MD:  Langston Masker - EM, CHIEF COMPLAINT:  SOB + Hypoxia  Brief History   74 yo F w/ ANCA vasculitis on chronic immunosuppression presents to ED 7/26 with SOB, hypoxia. Oscillating O2 needs between 2L and NRB- for comfort.   History of present illness   74 yo F PMH ANCA vasculitis,renal failure on HD,  pulmonary vasculitis, HTN, HLD, prior COVID infection who presents to ED with SOB. Notably, patient recently admitted 6/26-7/20 with progressive renal failure, respiratory failure. Received plex, cytoxan, steroids, bactrim, requiring intubation 7/15 but successfully extubated 7/18. She presents to ED with SOB which began the night prior to presentation. SOB was worse laying flat and improved when seated. Associated intermittent cough with colorless-to-tan sputum, with some blood tinged streaks. No missed HD sessions, due for HD day of ED presentation. No sick contacts, no fever chills, wheezing, no chest pain, no palpitations, no HA, dizziness.   In ED, became hypoxic when flat during CT chest, required NRB but has been down-tirtated to [facility]. PCCM and nephrology asked to evaluate in ED ED labs: WBC 13.8, Na 131 K 3.3, ABG 7.4/ 39/ 114/ 26/ 0/ 24.9/ 99  Past Medical History  ANCA vasculitis Renal Failure on HD Pulmonary vasculitis  Prior COVID-19 infection  HTN HLD Hypothyroidism  Significant Hospital Events   7/26 Presents to ED with SOB + hypoxia   Consults:  PCCM Nephrology  Procedures:    Significant Diagnostic Tests:  7/26 HRCT> bilateral pleural effusions, R>L. Bilateral ASD, suspicious for PNA and pulmonary edema. Interlobular septal thickening.   Micro Data:  7/26 SARS Cov2> neg 7/26 RVP > 7/26 Sputum cx> >  Antimicrobials:  7/26 Vanc> 7/26 Cefepime>  7/26 cipro> 7/26 bactrim per pharm>   Interim history/subjective:  Feels much better.  Breathing significantly improved. Hemoptysis improved. 2 episodes overnight. Small amount.   Objective   Blood pressure (!) 149/82, pulse 99, temperature 98.4 F (36.9 C), temperature source Oral, resp. rate 22, weight 60.4 kg, SpO2 99 %.        Intake/Output Summary (Last 24 hours) at 04/19/2020 1128 Last data filed at 04/19/2020 0310 Gross per 24 hour  Intake 840 ml  Output 0 ml  Net 840 ml   Filed Weights   04/17/20 1544 04/19/20 0527 04/19/20 0745  Weight: 57.6 kg 61.2 kg 60.4 kg    Examination: General: Elderly appearing female in NAD in bedside chair.  HEENT: [place]/AT, PERRL, no JVD Lungs: Scattered crackles, no distress on 2L [place].  Cardiovascular: RRR s1s2 no rgm. Cap refill < 3 seconds  Abdomen: Soft, non-tender, non-dsitended Extremities: No edema. No deformity. ROM intact.  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 I have no reason to suspect they are any different reason to be causing them besides alveolitis. She still has hemoptysis  CT chest has large dense areas of consolidation which may represent  areas of organizing pna since this has been going on for weeks.  She is immunosuppressed and at risk for superimposed infectious disease but does not have this clinically Bronchoscopy would be warranted if we wanted to rule out a new infection but I wouldn't want to bronch her if the plan is to just repeat PLEX and start rituxan. With the bronch she could end up on the vent again and she already has significant lung impairment  and I dont believe it will change our management.  I discussed this with Dr. Candiss Norse from nephrology.   ANCA vasculitis/Anti-GBM disease:  ESRD on HD MWF -no missed HD sessions, making very little urine since last HD 7/23 P - At home is on cytoxan and prednisone  - Continue Solu-Medrol 60 daily   Garner Nash, DO [facility] 04/19/2020 11:28 AM

## 2020-04-19 NOTE — Progress Notes (Signed)
Pharmacy Antibiotic Note  PAHOLA White is a 74 y.o. female admitted on 04/16/2020 with pneumonia.  Patient is ESRD receiving HD on MWF prior to admit. Extra session received 7/27.   She was scheduled for HD on 7/28 but this was postponed to today 7/29.  Vancomycin 500mg  IV x1 was given on 7/28 on 2C, no HD 7/28.  Now in HD this morning 7/29 and plan is for Friday 7/30 and Sat 7/31 then to get back on MWF HD schedule.    Afebrile, WBC increased to 17.6.  Cultures show as sent- not found.   Weight: 60.4 kg (133 lb 2.5 oz)  Temp (24hrs), Avg:98.3 F (36.8 C), Min:98 F (36.7 C), Max:98.6 F (37 C)  Recent Labs  Lab 04/16/20 0817 04/17/20 0140 04/19/20 0620 04/19/20 0816  WBC 13.8* 14.9* 17.6*  --   CREATININE 7.23* 4.35* 5.51* 5.63*    Estimated Creatinine Clearance: 8.2 mL/min (A) (by C-G formula based on SCr of 5.63 mg/dL (H)).    Allergies  Allergen Reactions  . Hctz [Hydrochlorothiazide] Other (See Comments)    Hx of severe Hyponatremia while taking HCTZ - now contraindicated  . Percocet [Oxycodone-Acetaminophen] Nausea Only  . Ultram [Tramadol] Nausea Only    Makes her crazy and nauseated   Plan: No Vancomycin needed today 7/29 as received 500 mg on 7/28. Will give Vancomycin 500 mg IV x1 after HD on Friday 7/30. F/u HD schedule daily for further vanc Consider level over weekend if needed.  Continue cefepime 1 g q24h to be given daily at 2000 (to attempt to be after HD)   Nicole Cella, RPh Clinical Pharmacist 04/19/2020 10:03 AM Please check AMION for all [place] numbers

## 2020-04-19 NOTE — Plan of Care (Signed)
  Problem: Education: Goal: Knowledge of General Education information will improve Description: Including pain rating scale, medication(s)/side effects and non-pharmacologic comfort measures Outcome: Progressing   Problem: Health Behavior/Discharge Planning: Goal: Ability to manage health-related needs will improve Outcome: Progressing   Problem: Clinical Measurements: Goal: Ability to maintain clinical measurements within normal limits will improve Outcome: Progressing   Problem: Clinical Measurements: Goal: Diagnostic test results will improve Outcome: Progressing   Problem: Clinical Measurements: Goal: Respiratory complications will improve Outcome: Progressing   Problem: Nutrition: Goal: Adequate nutrition will be maintained Outcome: Progressing   Problem: Coping: Goal: Level of anxiety will decrease Outcome: Progressing   Problem: Pain Managment: Goal: General experience of comfort will improve Outcome: Progressing   Problem: Skin Integrity: Goal: Risk for impaired skin integrity will decrease Outcome: Progressing

## 2020-04-19 NOTE — Progress Notes (Signed)
Patient c/o SOB with minimal exertion and talking. States that she felt this way last hospitalization before she was given blood. No morning labs were ordered for this AM. Patient on 4L O2 with sats in the high 80s to 90s. MD notified of pt status and findings. New orders given. Will continue to monitor.

## 2020-04-19 NOTE — Procedures (Signed)
[facility] Progress Note    A/P 1.  Dyspnea/hypoxic respiratory failure: Appears to be from multifocal pneumonia with superimposed pulmonary edema.  Continues to show improvement with lowering of dry weight/ultrafiltration and ongoing antibiotics.  If hemoptysis recurs, will give rituximab. 2.  Dialysis dependent acute kidney injury: Anuric and without any evidence of renal recovery to date, hemodialysis today-since we had to switch her over. Given below plan will change her HD schedule to TTS 3. Anti-GBM/RPGN  Incidentally also with a positive antinuclear antibody and antidouble-stranded DNA (likely false positives). -stat anti-GBM now -will reinitiate pulse steroids (solumedrol 500mg  x 3 days, back down to pred 60 thereafter) -plan for ritux today (hep panel neg and acid fast neg [bronch 7/15] from prev admission), side effect profile reviewed with patient -starting plasmapheresis tomorrow-plan for 7 treatments for now, discussed with CCM/Dr. Icard today. Holding off on bronch given likely [place] especially on imaging (last bronch 7/15). If anti-gbm lower than expected then will hold off on further plex and will need to look for an infectious source with a possible bronch. -PLEX plan: tomorrow, Sat, then MWF thereafter (switch HD schedule for now) 3. Anemia: Hemoglobin and hematocrit drifting downwards, likely secondary to critical illness.  Status post ESA on 7/23.  No overt blood loss including hemoptysis. -no heparin 4. CKD-MBD: Calcium level at goal when corrected for low albumin.  Continue VDRA for PTH control. 5. Nutrition: Continue renal diet with ongoing fluid restriction and renal multivitamin. 6. Hypertension: Blood pressure remains marginally elevated and will continue UF with HD.   S: seen on HD recurrent hemoptysis again last night. No clots. inc'ed O2 requirements from 2L to 4L [place] BP 135/84, HR 97, BFR400, uf goal 3500, rij tdc. I was present at this dialysis  session. I have reviewed the session itself and made appropriate changes.   Filed Weights   04/17/20 1544 04/19/20 0527 04/19/20 0745  Weight: 57.6 kg 61.2 kg 60.4 kg  BP (!) 149/81   Pulse 103   Temp 98.4 F (36.9 C) (Oral)   Resp 22   Wt 60.4 kg   SpO2 99%   BMI 21.49 kg/m  Gen: nad Resp: coarse breath sounds, 4L [place], normal WOB, bl chest expansion CV: reg rate Abd: soft, nt Ext: no edema, lue avf with good thrill/bruit Neuro: awake, speech clear and coherent, moves all ext spontaneously  Recent Labs  Lab 04/19/20 0816  NA 133*  K 4.1  CL 96*  CO2 23  GLUCOSE 139*  BUN 50*  CREATININE 5.63*  CALCIUM 8.7*  PHOS 5.6*    Recent Labs  Lab 04/16/20 0817 04/16/20 0817 04/16/20 1048 04/17/20 0140 04/19/20 0620  WBC 13.8*  --   --  14.9* 17.6*  NEUTROABS  --   --   --   --  16.2*  HGB 8.4*   < > 8.8* 8.1* 8.0*  HCT 26.0*   < > 26.0* 24.8* 24.8*  MCV 94.5  --   --  95.4 95.0  PLT 197  --   --  164 169   < > = values in this interval not displayed.    Scheduled Meds: . amitriptyline  50 mg Oral QHS  . calcitRIOL  0.25 mcg Oral Q M,W,F-HD  . Chlorhexidine Gluconate Cloth  6 each Topical Q0600  . cholecalciferol  1,000 Units Oral Daily  . heparin  5,000 Units Subcutaneous Q8H  . levothyroxine  88 mcg Oral QAC breakfast  . methylPREDNISolone (SOLU-MEDROL) injection  60 mg Intravenous Daily  . metoprolol succinate  100 mg Oral Daily  . multivitamin  1 tablet Oral QHS  . pantoprazole  40 mg Oral Daily  . potassium chloride  20 mEq Oral Once  . sodium chloride flush  3 mL Intravenous Once  . sulfamethoxazole-trimethoprim  1 tablet Oral Once per day on Mon Wed Fri  . [START ON 04/20/2020] vancomycin  500 mg Intravenous Once  . vancomycin variable dose per unstable renal function (pharmacist dosing)   Does not apply See admin instructions   Continuous Infusions: . sodium chloride    . sodium chloride    . anticoagulant sodium citrate    . ceFEPime (MAXIPIME) IV 1  g (04/18/20 2005)   PRN Meds:.sodium chloride, sodium chloride, acetaminophen **OR** acetaminophen, albuterol, alteplase, anticoagulant sodium citrate, chlorpheniramine-HYDROcodone, heparin, hydrALAZINE, HYDROcodone-acetaminophen, ipratropium-albuterol, lidocaine (PF), lidocaine-prilocaine, ondansetron **OR** ondansetron (ZOFRAN) IV, pentafluoroprop-tetrafluoroeth   Gean Quint, MD [facility] 04/19/2020, 10:57 AM

## 2020-04-19 NOTE — Progress Notes (Signed)
Off the unit for hemodialysis during shift change.

## 2020-04-19 NOTE — Progress Notes (Signed)
Back from hemodialysis by bed awake and alert. 

## 2020-04-19 NOTE — Progress Notes (Signed)
Solumedrol not given on due time due to poor vein access. IV team attempted to restart iv line but to no avail. Said med rescheduled.

## 2020-04-20 ENCOUNTER — Ambulatory Visit (INDEPENDENT_AMBULATORY_CARE_PROVIDER_SITE_OTHER): Payer: Medicare PPO

## 2020-04-20 DIAGNOSIS — I288 Other diseases of pulmonary vessels: Secondary | ICD-10-CM | POA: Diagnosis not present

## 2020-04-20 DIAGNOSIS — K8689 Other specified diseases of pancreas: Secondary | ICD-10-CM

## 2020-04-20 DIAGNOSIS — Z9181 History of falling: Secondary | ICD-10-CM

## 2020-04-20 DIAGNOSIS — N186 End stage renal disease: Secondary | ICD-10-CM

## 2020-04-20 DIAGNOSIS — N390 Urinary tract infection, site not specified: Secondary | ICD-10-CM | POA: Diagnosis not present

## 2020-04-20 DIAGNOSIS — N179 Acute kidney failure, unspecified: Secondary | ICD-10-CM

## 2020-04-20 DIAGNOSIS — Z792 Long term (current) use of antibiotics: Secondary | ICD-10-CM

## 2020-04-20 DIAGNOSIS — M4800 Spinal stenosis, site unspecified: Secondary | ICD-10-CM

## 2020-04-20 DIAGNOSIS — R0902 Hypoxemia: Secondary | ICD-10-CM

## 2020-04-20 DIAGNOSIS — D631 Anemia in chronic kidney disease: Secondary | ICD-10-CM

## 2020-04-20 DIAGNOSIS — J9811 Atelectasis: Secondary | ICD-10-CM

## 2020-04-20 DIAGNOSIS — K429 Umbilical hernia without obstruction or gangrene: Secondary | ICD-10-CM

## 2020-04-20 DIAGNOSIS — E785 Hyperlipidemia, unspecified: Secondary | ICD-10-CM

## 2020-04-20 DIAGNOSIS — Z85828 Personal history of other malignant neoplasm of skin: Secondary | ICD-10-CM

## 2020-04-20 DIAGNOSIS — J9601 Acute respiratory failure with hypoxia: Secondary | ICD-10-CM

## 2020-04-20 DIAGNOSIS — Z96653 Presence of artificial knee joint, bilateral: Secondary | ICD-10-CM

## 2020-04-20 DIAGNOSIS — M5410 Radiculopathy, site unspecified: Secondary | ICD-10-CM

## 2020-04-20 DIAGNOSIS — Z8616 Personal history of COVID-19: Secondary | ICD-10-CM

## 2020-04-20 DIAGNOSIS — G8929 Other chronic pain: Secondary | ICD-10-CM | POA: Diagnosis not present

## 2020-04-20 DIAGNOSIS — K8020 Calculus of gallbladder without cholecystitis without obstruction: Secondary | ICD-10-CM

## 2020-04-20 DIAGNOSIS — Z981 Arthrodesis status: Secondary | ICD-10-CM

## 2020-04-20 DIAGNOSIS — M47816 Spondylosis without myelopathy or radiculopathy, lumbar region: Secondary | ICD-10-CM

## 2020-04-20 DIAGNOSIS — E039 Hypothyroidism, unspecified: Secondary | ICD-10-CM

## 2020-04-20 DIAGNOSIS — Z992 Dependence on renal dialysis: Secondary | ICD-10-CM

## 2020-04-20 DIAGNOSIS — I700 Atherosclerosis of aorta: Secondary | ICD-10-CM

## 2020-04-20 DIAGNOSIS — I120 Hypertensive chronic kidney disease with stage 5 chronic kidney disease or end stage renal disease: Secondary | ICD-10-CM | POA: Diagnosis not present

## 2020-04-20 DIAGNOSIS — Z7952 Long term (current) use of systemic steroids: Secondary | ICD-10-CM

## 2020-04-20 DIAGNOSIS — E871 Hypo-osmolality and hyponatremia: Secondary | ICD-10-CM

## 2020-04-20 DIAGNOSIS — G4700 Insomnia, unspecified: Secondary | ICD-10-CM

## 2020-04-20 LAB — BASIC METABOLIC PANEL
Anion gap: 16 — ABNORMAL HIGH (ref 5–15)
BUN: 36 mg/dL — ABNORMAL HIGH (ref 8–23)
CO2: 22 mmol/L (ref 22–32)
Calcium: 8.4 mg/dL — ABNORMAL LOW (ref 8.9–10.3)
Chloride: 97 mmol/L — ABNORMAL LOW (ref 98–111)
Creatinine, Ser: 3.67 mg/dL — ABNORMAL HIGH (ref 0.44–1.00)
GFR calc Af Amer: 13 mL/min — ABNORMAL LOW (ref >60)
GFR calc non Af Amer: 12 mL/min — ABNORMAL LOW (ref >60)
Glucose, Bld: 179 mg/dL — ABNORMAL HIGH (ref 70–99)
Potassium: 4.4 mmol/L (ref 3.5–5.1)
Sodium: 135 mmol/L (ref 135–145)

## 2020-04-20 LAB — CBC
HCT: 23.8 % — ABNORMAL LOW (ref 36.0–46.0)
Hemoglobin: 7.8 g/dL — ABNORMAL LOW (ref 12.0–15.0)
MCH: 30.7 pg (ref 26.0–34.0)
MCHC: 32.8 g/dL (ref 30.0–36.0)
MCV: 93.7 fL (ref 80.0–100.0)
Platelets: 131 10*3/uL — ABNORMAL LOW (ref 150–400)
RBC: 2.54 MIL/uL — ABNORMAL LOW (ref 3.87–5.11)
RDW: 19.1 % — ABNORMAL HIGH (ref 11.5–15.5)
WBC: 18.6 10*3/uL — ABNORMAL HIGH (ref 4.0–10.5)
nRBC: 0 % (ref 0.0–0.2)

## 2020-04-20 LAB — GLUCOSE, CAPILLARY: Glucose-Capillary: 218 mg/dL — ABNORMAL HIGH (ref 70–99)

## 2020-04-20 LAB — GLOMERULAR BASEMENT MEMBRANE ANTIBODIES: GBM Ab: 63 units — ABNORMAL HIGH (ref 0–20)

## 2020-04-20 MED ORDER — ACD FORMULA A 0.73-2.45-2.2 GM/100ML VI SOLN
Status: AC
  Administered 2020-04-20: 1000 mL
  Filled 2020-04-20: qty 500

## 2020-04-20 MED ORDER — DIPHENHYDRAMINE HCL 25 MG PO CAPS
ORAL_CAPSULE | ORAL | Status: AC
  Administered 2020-04-20: 25 mg via ORAL
  Filled 2020-04-20: qty 1

## 2020-04-20 MED ORDER — CALCIUM CARBONATE ANTACID 500 MG PO CHEW
2.0000 | CHEWABLE_TABLET | ORAL | Status: AC
  Administered 2020-04-20 (×2): 400 mg via ORAL
  Filled 2020-04-20 (×2): qty 2

## 2020-04-20 MED ORDER — ACETAMINOPHEN 325 MG PO TABS: 650.0000 mg | ORAL_TABLET | ORAL | Status: DC | PRN

## 2020-04-20 MED ORDER — CALCIUM GLUCONATE-NACL 2-0.675 GM/100ML-% IV SOLN
2.0000 g | Freq: Once | INTRAVENOUS | Status: AC
  Administered 2020-04-20: 2000 mg via INTRAVENOUS
  Filled 2020-04-20 (×2): qty 100

## 2020-04-20 MED ORDER — ANTICOAGULANT SODIUM CITRATE 4% (200MG/5ML) IV SOLN
5.0000 mL | Status: AC
  Administered 2020-04-20: 5 mL
  Filled 2020-04-20: qty 5

## 2020-04-20 MED ORDER — VANCOMYCIN HCL IN DEXTROSE 500-5 MG/100ML-% IV SOLN: 500.0000 mg | Freq: Once | INTRAVENOUS | Status: DC

## 2020-04-20 MED ORDER — CALCIUM CARBONATE ANTACID 500 MG PO CHEW
CHEWABLE_TABLET | ORAL | Status: AC
  Administered 2020-04-20: 400 mg via ORAL
  Filled 2020-04-20: qty 4

## 2020-04-20 MED ORDER — ACD FORMULA A 0.73-2.45-2.2 GM/100ML VI SOLN: 1000.0000 mL | Status: DC

## 2020-04-20 MED ORDER — DIPHENHYDRAMINE HCL 25 MG PO CAPS: 25.0000 mg | ORAL_CAPSULE | Freq: Four times a day (QID) | ORAL | Status: DC | PRN

## 2020-04-20 NOTE — Progress Notes (Signed)
[facility] Progress Note    A/P 1.  Dyspnea/hypoxic respiratory failure: likely secondary to recurrence of alveolitis/DAH, no significant improvement with ultrafiltration/antibiotics with recurrent hemoptysis throughout her admission. Imaging reviewed with CCM/pulm, see below 2.  Dialysis dependent acute kidney injury: Anuric and without any evidence of renal recovery to date, hemodialysis today-since we had to switch her over. Given below plan will change her HD schedule to TTS while she is in-house, will need to revert back to MWF once done with PLEX x 7 treatments -no indication for dialysis today, will plan for dialysis tomorrow -given that she very minimal urine output along with 100% crescents on biopsy there is a very low likelihood for renal recovery 3. Anti-GBM/RPGN  Incidentally also with a positive antinuclear antibody and antidouble-stranded DNA (likely false positives). -Anti-GBM sent 7/29.  Discussed with main lab here and will likely get results by Monday or Tuesday -Reinitiate pulse steroids with Solu-Medrol 500 mg x 3 days (started 7/29).  Thereafter taper back down to prednisone 60 mg daily -Initiated rituximab 1 g.  Received dose on 7/29.  Repeat dose on 8/12  (2 weeks after).  Should plan for Rituxan panel 3 months thereafter -We will empirically start plasmapheresis for 7 treatments.  Tentatively the dates for PLEX will be 7/30, 7/31, 8/2, 8/4, 8/6, 8/9, 8/11.  Utilizing FFP -Discussed with CCM on 7/29.  Holding off on repeat bronch for now given likelihood of DAH/alveolitis on imaging and high risk for intubation.  If her anti-GBM is lower than expected then we will hold off on further plasmapheresis and will likely need to go look for an infectious source via a possible bronch at that junction 3. Anemia: Hemoglobin and hematocrit drifting downwards, likely secondary to critical illness.  Status post ESA on 7/23.  No overt blood loss including hemoptysis. -no  heparin 4. CKD-MBD: Calcium level at goal when corrected for low albumin.  Continue VDRA for PTH control. 5. Nutrition: Continue renal diet with ongoing fluid restriction and renal multivitamin. 6. Hypertension: Blood pressure remains marginally elevated and will continue UF with HD.   SUBJECTIVE: No acute events overnight.  Still having intermittent hemoptysis.  Had a productive cough in front of me with dark red/maroon sputum.  She reports that is not as bad as it was before.  Received rituximab yesterday and tolerated infusion.  Status post 3 L ultrafiltration with hemodialysis for which she tolerated on 7/29. Denies any fevers, chest pain, dizziness, changes in vision, rash.  OBJECTIVE:  Filed Weights   04/19/20 0527 04/19/20 0745 04/19/20 1150  Weight: 61.2 kg 60.4 kg 57.4 kg  BP (!) 148/83 (BP Location: Right Arm)   Pulse (!) 109   Temp 97.6 F (36.4 C) (Oral)   Resp (!) 25   Wt 57.4 kg   SpO2 97%   BMI 20.42 kg/m  Gen: nad, sitting up in bed Resp: coarse breath sounds (improved), normal WOB, bl chest expansion CV: reg rate, S1-S2 Abd: soft, nt/nondistended Ext: no edema, lue avf with good thrill/bruit Skin: Right IJ tunneled dialysis catheter: Clear/dry/intact/dressing in place Neuro: awake, speech clear and coherent, moves all ext spontaneously  Recent Labs  Lab 04/19/20 0816 04/19/20 0816 04/20/20 0709  NA 133*   < > 135  K 4.1   < > 4.4  CL 96*   < > 97*  CO2 23   < > 22  GLUCOSE 139*   < > 179*  BUN 50*   < > 36*  CREATININE  5.63*   < > 3.67*  CALCIUM 8.7*   < > 8.4*  PHOS 5.6*  --   --    < > = values in this interval not displayed.    Recent Labs  Lab 04/16/20 0817 04/16/20 0817 04/16/20 1048 04/17/20 0140 04/19/20 0620  WBC 13.8*  --   --  14.9* 17.6*  NEUTROABS  --   --   --   --  16.2*  HGB 8.4*   < > 8.8* 8.1* 8.0*  HCT 26.0*   < > 26.0* 24.8* 24.8*  MCV 94.5  --   --  95.4 95.0  PLT 197  --   --  164 169   < > = values in this interval  not displayed.    Scheduled Meds: . amitriptyline  50 mg Oral QHS  . calcitRIOL  0.25 mcg Oral Q M,W,F-HD  . calcium carbonate  2 tablet Oral Q3H  . Chlorhexidine Gluconate Cloth  6 each Topical Q0600  . cholecalciferol  1,000 Units Oral Daily  . levothyroxine  88 mcg Oral QAC breakfast  . metoprolol succinate  100 mg Oral Daily  . multivitamin  1 tablet Oral QHS  . pantoprazole  40 mg Oral Daily  . potassium chloride  20 mEq Oral Once  . sodium chloride flush  3 mL Intravenous Once  . sulfamethoxazole-trimethoprim  1 tablet Oral Once per day on Mon Wed Fri  . vancomycin  500 mg Intravenous Once  . vancomycin variable dose per unstable renal function (pharmacist dosing)   Does not apply See admin instructions   Continuous Infusions: . anticoagulant sodium citrate    . anticoagulant sodium citrate    . calcium gluconate    . ceFEPime (MAXIPIME) IV 1 g (04/19/20 2100)  . citrate dextrose    . famotidine (PEPCID) IV    . methylPREDNISolone (SOLU-MEDROL) injection 500 mg (04/19/20 2148)  . sodium chloride     PRN Meds:.acetaminophen **OR** acetaminophen, acetaminophen, albuterol, albuterol, anticoagulant sodium citrate, chlorpheniramine-HYDROcodone, diphenhydrAMINE, diphenhydrAMINE, EPINEPHrine, famotidine (PEPCID) IV, hydrALAZINE, HYDROcodone-acetaminophen, ipratropium-albuterol, methylPREDNISolone (SOLU-MEDROL) injection, ondansetron **OR** ondansetron (ZOFRAN) IV, sodium chloride, zolpidem     Gean Quint, MD [facility] 04/20/2020, 8:23 AM

## 2020-04-20 NOTE — Progress Notes (Signed)
Progress Note    Jasmine White  RWE:[id number] DOB: 1946-01-20  DOA: 04/16/2020 PCP: Chevis Pretty, FNP    Brief Narrative:     Medical records reviewed and are as summarized below:  Jasmine White is an 74 y.o. female with recent hospitalization from 6/26-7/20 for AKI due to ANCA positive RPGN now on HD via right PermCath MWF, HTN, hypothyroidism, HLD and osteoarthritis presenting with dyspnea, orthopnea and hemoptysis and admitted for acute respiratory failure with hypoxia likely due to pulmonary edema, possible pneumonia and vasculitis.  CXR concerning for multifocal pneumonia.  High-resolution CT chest with very extensive, dense and somewhat geographic heterogeneous and consolidative airspace disease throughout the lung with moderate right and small left pleural effusion.  She desaturated to 75% lying down for CT scan requiring NRB.  She received emergent HD with ultrafiltration of 3.6 L and improvement in her breathing.  She was eventually weaned to 5 L.  She was also started on broad-spectrum antibiotics for pneumonia and a steroid for possible pulmonary vasculitis.  PCCM and nephrology following and appreciate their help with this complicated patient.    Assessment/Plan:   Active Problems:   Essential hypertension, benign   Hypothyroidism   Chronic back pain   Hyponatremia   Acute renal failure (HCC)   Acute respiratory failure with hypoxia (HCC)   Hypokalemia   HAP (hospital-acquired pneumonia)   Acute pulmonary edema (HCC)   Acute respiratory failure with hypoxia -primarily due to pulmonary edema with possible concomitant pneumonia and vasculitis.  Has some leukocytosis which could be due to steroid.   - hemoptysis lessening - CXR concerning for multifocal pneumonia.  -High-resolution CT chest with very extensive, dense and somewhat geographic heterogeneous and consolidative airspace disease throughout the lung with moderate right and small left pleural  effusion.   -worsening O2 status and hemoptysis overnight 7/28-7/29- plan to do pulse steroids- IV x 3 days (7/29) then PO, ritux (first dose 7/29, next dose 8/12, and plasmapheresis (7/30, 7/31, 8/2, 8/4, 8/6, 8/9, 8/11)  Pulmonary edema with moderate right pleural effusion:  -continue HD for volume removal as she makes very little to no urine  Hospital-acquired pneumonia?  Patient has some leukocytosis but no fever.  Leukocytosis could be demargination from steroid.  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 -HD per nephrology -Solu-Medrol as above.  Anemia of renal disease: Hgb 8.1 (about baseline) -Continue monitoring -ESA and IV iron per nephrology  Uncontrolled hypertension  -Fluid management with dialysis -Continue home metoprolol with as needed hydralazine  Hypothyroidism -Continue home Synthroid  Secondary hyperparathyroidism -Per nephrology  Hyponatremia -Per nephrology  Chronic back pain/mood disorder -Continue home amitriptyline    Family Communication/Anticipated D/C date and plan/Code Status   DVT prophylaxis: heparin on hold due to hemoptysis- use SCDs for now Code Status: Full Code.  Disposition Plan: Status is: Inpatient  Remains inpatient appropriate because:IV treatments appropriate due to intensity of illness or inability to take PO   Dispo: The patient is from: Home              Anticipated d/c is to: Home  Anticipated d/c date is: > 3 days              Patient  currently is not medically stable to d/c.         Medical Consultants:    Renal  PCCM    Subjective:   1 episode of hemoptysis this AM  Objective:    Vitals:   04/19/20 2317 04/20/20 0330 04/20/20 0535 04/20/20 0737  BP: (!) 146/85 (!) 144/79  (!) 148/83  Pulse: 84 87  (!) 109  Resp: 23 21  (!) 25  Temp: 98.4 F (36.9 C) 98.4 F (36.9 C)  97.6 F (36.4 C)  TempSrc: Oral Oral  Oral  SpO2: 99% 100% 99% 97%  Weight:        Intake/Output Summary (Last 24 hours) at 04/20/2020 1006 Last data filed at 04/20/2020 0700 Gross per 24 hour  Intake 879 ml  Output 3050 ml  Net -2171 ml   Filed Weights   04/19/20 0527 04/19/20 0745 04/19/20 1150  Weight: 61.2 kg 60.4 kg 57.4 kg    Exam:   General: Appearance:    Well developed, well nourished female on O2     Lungs:     Crackles throughout R>L  Heart:    Tachycardic.   MS:   All extremities are intact.   Neurologic:   Awake, alert, oriented x 3. No apparent focal neurological           defect.     Data Reviewed:   I have personally reviewed following labs and imaging studies:  Labs: Labs show the following:   Basic Metabolic Panel: Recent Labs  Lab 04/16/20 0817 04/16/20 0817 04/16/20 1048 04/16/20 1048 04/17/20 0140 04/17/20 0140 04/19/20 0620 04/19/20 0620 04/19/20 0816 04/20/20 0709  NA 131*   < > 131*  --  131*  --  132*  --  133* 135  K 3.3*   < > 3.4*   < > 4.4   < > 4.3   < > 4.1 4.4  CL 90*  --   --   --  95*  --  95*  --  96* 97*  CO2 25  --   --   --  22  --  21*  --  23 22  GLUCOSE 109*  --   --   --  181*  --  128*  --  139* 179*  BUN 74*  --   --   --  34*  --  51*  --  50* 36*  CREATININE 7.23*  --   --   --  4.35*  --  5.51*  --  5.63* 3.67*  CALCIUM 7.9*  --   --   --  7.5*  --  8.7*  --  8.7* 8.4*  PHOS  --   --   --   --   --   --   --   --  5.6*  --    < > = values in this interval not displayed.   GFR Estimated Creatinine Clearance: 12.2 mL/min (A) (by C-G formula based  on SCr of 3.67 mg/dL (H)). Liver Function Tests: Recent Labs  Lab 04/16/20 0817 04/19/20 0620 04/19/20 0816  AST 26 26  --   ALT 15 12  --   ALKPHOS 55 64  --   BILITOT 0.6 0.9  --   PROT 5.8* 5.7*  --   ALBUMIN 3.0* 2.6* 2.6*   No results for  input(s): LIPASE, AMYLASE in the last 168 hours. No results for input(s): AMMONIA in the last 168 hours. Coagulation profile No results for input(s): INR, PROTIME in the last 168 hours.  CBC: Recent Labs  Lab 04/16/20 0817 04/16/20 1048 04/17/20 0140 04/19/20 0620  WBC 13.8*  --  14.9* 17.6*  NEUTROABS  --   --   --  16.2*  HGB 8.4* 8.8* 8.1* 8.0*  HCT 26.0* 26.0* 24.8* 24.8*  MCV 94.5  --  95.4 95.0  PLT 197  --  164 169   Cardiac Enzymes: No results for input(s): CKTOTAL, CKMB, CKMBINDEX, TROPONINI in the last 168 hours. BNP (last 3 results) No results for input(s): PROBNP in the last 8760 hours. CBG: No results for input(s): GLUCAP in the last 168 hours. D-Dimer: No results for input(s): DDIMER in the last 72 hours. Hgb A1c: No results for input(s): HGBA1C in the last 72 hours. Lipid Profile: No results for input(s): CHOL, HDL, LDLCALC, TRIG, CHOLHDL, LDLDIRECT in the last 72 hours. Thyroid function studies: No results for input(s): TSH, T4TOTAL, T3FREE, THYROIDAB in the last 72 hours.  Invalid input(s): FREET3 Anemia work up: No results for input(s): VITAMINB12, FOLATE, FERRITIN, TIBC, IRON, RETICCTPCT in the last 72 hours. Sepsis Labs: Recent Labs  Lab 04/16/20 0817 04/16/20 1644 04/17/20 0140 04/19/20 0620  PROCALCITON  --  0.68  --   --   WBC 13.8*  --  14.9* 17.6*    Microbiology Recent Results (from the past 240 hour(s))  SARS Coronavirus 2 by RT PCR (hospital order, performed in [facility] hospital lab) Nasopharyngeal Nasopharyngeal Swab     Status: None   Collection Time: 04/16/20  9:11 AM   Specimen: Nasopharyngeal Swab  Result Value Ref Range Status   SARS Coronavirus 2 NEGATIVE NEGATIVE Final     Comment: (NOTE) SARS-CoV-2 target nucleic acids are NOT DETECTED.  The SARS-CoV-2 RNA is generally detectable in upper and lower respiratory specimens during the acute phase of infection. The lowest concentration of SARS-CoV-2 viral copies this assay can detect is 250 copies / mL. A negative result does not preclude SARS-CoV-2 infection and should not be used as the sole basis for treatment or other patient management decisions.  A negative result may occur with improper specimen collection / handling, submission of specimen other than nasopharyngeal swab, presence of viral mutation(s) within the areas targeted by this assay, and inadequate number of viral copies (<250 copies / mL). A negative result must be combined with clinical observations, patient history, and epidemiological information.  Fact Sheet for Patients:   StrictlyIdeas.no  Fact Sheet for Healthcare Providers: BankingDealers.co.za  This test is not yet approved or  cleared by the [place] FDA and has been authorized for detection and/or diagnosis of SARS-CoV-2 by FDA under an Emergency Use Authorization (EUA).  This EUA will remain in effect (meaning this test can be used) for the duration of the COVID-19 declaration under Section 564(b)(1) of the Act, 21 U.S.C. section 360bbb-3(b)(1), unless the authorization is terminated or revoked sooner.  Performed at [facility], [place] [address] [place], [place] [address]     Procedures and diagnostic studies:  DG Chest 1 View  Result Date: 04/19/2020 CLINICAL DATA:  Shortness of breath EXAM: CHEST  1 VIEW COMPARISON:  04/18/2020. FINDINGS: Dual-lumen catheter with tip over SVC in stable position. Heart size stable. Progressive severe diffuse bilateral pulmonary infiltrates/edema. No prominent pleural effusion. No pneumothorax. IMPRESSION: 1.  Dual-lumen catheter noted with tip over  SVC in stable position. 2.  Progressive severe diffuse bilateral pulmonary infiltrates/edema. Electronically Signed   By: Marcello Moores  Register   On: 04/19/2020 07:46    Medications:   . amitriptyline  50 mg Oral QHS  . calcitRIOL  0.25 mcg Oral Q M,W,F-HD  . calcium carbonate  2 tablet Oral Q3H  . Chlorhexidine Gluconate Cloth  6 each Topical Q0600  . cholecalciferol  1,000 Units Oral Daily  . levothyroxine  88 mcg Oral QAC breakfast  . metoprolol succinate  100 mg Oral Daily  . multivitamin  1 tablet Oral QHS  . pantoprazole  40 mg Oral Daily  . potassium chloride  20 mEq Oral Once  . sodium chloride flush  3 mL Intravenous Once  . sulfamethoxazole-trimethoprim  1 tablet Oral Once per day on Mon Wed Fri  . vancomycin  500 mg Intravenous Once  . vancomycin variable dose per unstable renal function (pharmacist dosing)   Does not apply See admin instructions   Continuous Infusions: . anticoagulant sodium citrate    . anticoagulant sodium citrate    . calcium gluconate    . ceFEPime (MAXIPIME) IV 1 g (04/19/20 2100)  . citrate dextrose    . famotidine (PEPCID) IV    . methylPREDNISolone (SOLU-MEDROL) injection 500 mg (04/19/20 2148)  . sodium chloride       LOS: 4 days   Geradine Girt  [facility]   How to contact the [facility] Attending or Consulting provider [place] or covering provider during after hours [place], for this patient?  1. Check the care team in [facility] and look for a) attending/consulting [facility] provider listed and b) the [facility] team listed 2. Log into [email or web address] and use [place]'s universal password to access. If you do not have the password, please contact the hospital operator. 3. Locate the [facility] provider you are looking for under [facility] and page to a number that you can be directly reached. 4. If you still have difficulty reaching the provider, please page the [facility] (Director on Call) for the Hospitalists listed on amion for assistance.  04/20/2020, 10:06 AM

## 2020-04-20 NOTE — Progress Notes (Signed)
Back from HD by bed awake and alert. °

## 2020-04-20 NOTE — Progress Notes (Signed)
Transported to HD dept. for plasmapheresis, by bed awake and alert.

## 2020-04-20 NOTE — Progress Notes (Signed)
NAME:  Jasmine White, MRN:  [id number], DOB:  [birth year], LOS: 4 ADMISSION DATE:  04/16/2020, CONSULTATION DATE:  04/16/20 REFERRING MD:  Langston Masker - EM, CHIEF COMPLAINT:  SOB + Hypoxia  Brief History   74 yo F w/ ANCA vasculitis on chronic immunosuppression presents to ED 7/26 with SOB, hypoxia. Oscillating O2 needs between 2L and NRB- for comfort.   History of present illness   74 yo F PMH ANCA vasculitis,renal failure on HD,  pulmonary vasculitis, HTN, HLD, prior COVID infection who presents to ED with SOB. Notably, patient recently admitted 6/26-7/20 with progressive renal failure, respiratory failure. Received plex, cytoxan, steroids, bactrim, requiring intubation 7/15 but successfully extubated 7/18.  She presents to ED with SOB which began the night prior to presentation. SOB was worse laying flat and improved when seated. Associated intermittent cough with colorless-to-tan sputum, with some blood tinged streaks. No missed HD sessions, due for HD day of ED presentation. No sick contacts, no fever chills, wheezing, no chest pain, no palpitations, no HA, dizziness.   In ED, became hypoxic when flat during CT chest, required NRB but has been down-tirtated to [facility]. PCCM and nephrology asked to evaluate in ED. ED labs: WBC 13.8, Na 131 K 3.3, ABG 7.4/ 39/ 114/ 26/ 0/ 24.9/ 99  Past Medical History  ANCA vasculitis Renal Failure on HD Pulmonary vasculitis  Prior COVID-19 infection  HTN HLD Hypothyroidism  Significant Hospital Events   7/26 Presents to ED with SOB + hypoxia   Consults:  PCCM Nephrology  Procedures:    Significant Diagnostic Tests:  7/26 HRCT> bilateral pleural effusions, R>L. Bilateral ASD, suspicious for PNA and pulmonary edema. Interlobular septal thickening.   Micro Data:  7/26 SARS Cov2> neg 7/26 RVP > 7/26 Sputum cx> >  Antimicrobials:  7/26 Vanc> 7/26 Cefepime>  7/26 cipro> 7/26 bactrim per pharm>   Interim history/subjective:   Patient doing  well today. She states that she is breathing better   Objective   Blood pressure (!) 148/83, pulse (!) 109, temperature 97.6 F (36.4 C), temperature source Oral, resp. rate (!) 25, weight 57.4 kg, SpO2 97 %.        Intake/Output Summary (Last 24 hours) at 04/20/2020 0902 Last data filed at 04/20/2020 0700 Gross per 24 hour  Intake 879 ml  Output 3050 ml  Net -2171 ml   Filed Weights   04/19/20 0527 04/19/20 0745 04/19/20 1150  Weight: 61.2 kg 60.4 kg 57.4 kg    Examination: General appearance: 74 y.o., female, NAD, conversant  Eyes: anicteric sclerae, moist conjunctivae; no lid-lag; PERRLA, tracking appropriately HENT: NCAT; oropharynx Neck: Trachea midline; FROM, supple, lymphadenopathy, no JVD Lungs: CTAB, no crackles, no wheeze CV: RRR, S1, S2, no MRGs  Abdomen: Soft, non-tender; non-distended, BS present  Extremities: No peripheral edema, radial and DP pulses present bilaterally  Skin: Normal temperature, turgor and texture; no rash Psych: Appropriate affect Neuro: Alert and oriented to person and place, no focal deficit     Resolved Hospital Problem list     Assessment & Plan:  Pulmonary Renal Syndrome  Anti-GBM disease  ANCA, +MPO AB  RPGN now esrd on iHD  Recurrent hemoptysis  AHRF, O2 dependent 2/2 DAH and alveolitis  History of COVID19  Plan: Continue oral cytoxan  PLEX planned by nephrology  Rituxan planned by nephrology  Weaning Fio2 as tolerated   ANCA vasculitis/Anti-GBM disease:  ESRD on HD MWF -no missed HD sessions, making very little urine since last HD 7/23 P -  At home is on cytoxan and prednisone  - Continue Solu-Medrol 60 daily  Pulmonary will follow    Garner Nash, DO [facility] 04/20/2020 9:02 AM

## 2020-04-20 NOTE — Progress Notes (Signed)
Pharmacy Antibiotic Note  Jasmine White is a 74 y.o. female admitted on 04/16/2020 with pneumonia.  Patient is ESRD receiving HD on MWF prior to admit. Extra session received 7/27 with last HD on 7/29. Plans noted for TTS HD while on plasmapheresis -WBC= 17.6     Weight: 57.4 kg (126 lb 8.7 oz)  Temp (24hrs), Avg:98.7 F (37.1 C), Min:97.5 F (36.4 C), Max:99.5 F (37.5 C)  Recent Labs  Lab 04/16/20 0817 04/17/20 0140 04/19/20 0620 04/19/20 0816 04/20/20 0709  WBC 13.8* 14.9* 17.6*  --   --   CREATININE 7.23* 4.35* 5.51* 5.63* 3.67*    Estimated Creatinine Clearance: 12.2 mL/min (A) (by C-G formula based on SCr of 3.67 mg/dL (H)).    Allergies  Allergen Reactions   Hctz [Hydrochlorothiazide] Other (See Comments)    Hx of severe Hyponatremia while taking HCTZ - now contraindicated   Percocet [Oxycodone-Acetaminophen] Nausea Only   Ultram [Tramadol] Nausea Only    Makes her crazy and nauseated   Plan: Check a pre-HD vancomycin level on 7/31 Continue cefepime 1 g q24h to be given daily at 2000 (to attempt to be after HD)  Hildred Laser, PharmD Clinical Pharmacist **Pharmacist phone directory can now be found on [email or web address] ([email or web address]).  Listed under [place].

## 2020-04-21 DIAGNOSIS — I7782 Antineutrophilic cytoplasmic antibody (ANCA) vasculitis: Secondary | ICD-10-CM

## 2020-04-21 LAB — THERAPEUTIC PLASMA EXCHANGE (BLOOD BANK)
Plasma Exchange: 3000
Plasma volume needed: 3000
Unit division: 0
Unit division: 0
Unit division: 0
Unit division: 0
Unit division: 0

## 2020-04-21 LAB — CBC
HCT: 23.3 % — ABNORMAL LOW (ref 36.0–46.0)
Hemoglobin: 7.5 g/dL — ABNORMAL LOW (ref 12.0–15.0)
MCH: 30.4 pg (ref 26.0–34.0)
MCHC: 32.2 g/dL (ref 30.0–36.0)
MCV: 94.3 fL (ref 80.0–100.0)
Platelets: 142 10*3/uL — ABNORMAL LOW (ref 150–400)
RBC: 2.47 MIL/uL — ABNORMAL LOW (ref 3.87–5.11)
RDW: 19 % — ABNORMAL HIGH (ref 11.5–15.5)
WBC: 18.9 10*3/uL — ABNORMAL HIGH (ref 4.0–10.5)
nRBC: 0 % (ref 0.0–0.2)

## 2020-04-21 LAB — BASIC METABOLIC PANEL
Anion gap: 17 — ABNORMAL HIGH (ref 5–15)
BUN: 55 mg/dL — ABNORMAL HIGH (ref 8–23)
CO2: 22 mmol/L (ref 22–32)
Calcium: 8.5 mg/dL — ABNORMAL LOW (ref 8.9–10.3)
Chloride: 93 mmol/L — ABNORMAL LOW (ref 98–111)
Creatinine, Ser: 4.69 mg/dL — ABNORMAL HIGH (ref 0.44–1.00)
GFR calc Af Amer: 10 mL/min — ABNORMAL LOW (ref >60)
GFR calc non Af Amer: 9 mL/min — ABNORMAL LOW (ref >60)
Glucose, Bld: 246 mg/dL — ABNORMAL HIGH (ref 70–99)
Potassium: 3.9 mmol/L (ref 3.5–5.1)
Sodium: 132 mmol/L — ABNORMAL LOW (ref 135–145)

## 2020-04-21 LAB — VANCOMYCIN, RANDOM: Vancomycin Rm: 14

## 2020-04-21 MED ORDER — ACETAMINOPHEN 325 MG PO TABS
ORAL_TABLET | ORAL | Status: AC
  Filled 2020-04-21: qty 2

## 2020-04-21 MED ORDER — ACD FORMULA A 0.73-2.45-2.2 GM/100ML VI SOLN
Status: AC
  Filled 2020-04-21: qty 500

## 2020-04-21 MED ORDER — CALCIUM CARBONATE ANTACID 500 MG PO CHEW
2.0000 | CHEWABLE_TABLET | ORAL | Status: DC
  Administered 2020-04-21: 400 mg via ORAL

## 2020-04-21 MED ORDER — DARBEPOETIN ALFA 100 MCG/0.5ML IJ SOSY
100.0000 ug | PREFILLED_SYRINGE | INTRAMUSCULAR | Status: DC
  Administered 2020-04-21 – 2020-04-28 (×2): 100 ug via INTRAVENOUS

## 2020-04-21 MED ORDER — VANCOMYCIN HCL 500 MG/100ML IV SOLN: 500.0000 mg | Freq: Once | INTRAVENOUS | Status: DC

## 2020-04-21 MED ORDER — VANCOMYCIN HCL 1000 MG IV SOLR
500.0000 mg | Freq: Once | INTRAVENOUS | Status: AC
  Administered 2020-04-21: 500 mg via INTRAVENOUS
  Filled 2020-04-21: qty 500

## 2020-04-21 MED ORDER — DARBEPOETIN ALFA 100 MCG/0.5ML IJ SOSY
PREFILLED_SYRINGE | INTRAMUSCULAR | Status: AC
  Filled 2020-04-21: qty 0.5

## 2020-04-21 MED ORDER — ACD FORMULA A 0.73-2.45-2.2 GM/100ML VI SOLN
1000.0000 mL | Status: DC
  Administered 2020-04-21: 1000 mL

## 2020-04-21 MED ORDER — DIPHENHYDRAMINE HCL 25 MG PO CAPS
25.0000 mg | ORAL_CAPSULE | Freq: Four times a day (QID) | ORAL | Status: DC | PRN
  Administered 2020-04-21: 25 mg via ORAL

## 2020-04-21 MED ORDER — DIPHENHYDRAMINE HCL 25 MG PO CAPS
ORAL_CAPSULE | ORAL | Status: AC
  Filled 2020-04-21: qty 1

## 2020-04-21 MED ORDER — ACETAMINOPHEN 325 MG PO TABS
650.0000 mg | ORAL_TABLET | ORAL | Status: DC | PRN
  Administered 2020-04-21: 650 mg via ORAL

## 2020-04-21 MED ORDER — PREDNISONE 50 MG PO TABS
60.0000 mg | ORAL_TABLET | Freq: Every day | ORAL | Status: DC
  Administered 2020-04-22 – 2020-05-03 (×12): 60 mg via ORAL
  Filled 2020-04-21 (×12): qty 1

## 2020-04-21 MED ORDER — ANTICOAGULANT SODIUM CITRATE 4% (200MG/5ML) IV SOLN
5.0000 mL | Freq: Once | Status: DC
  Filled 2020-04-21: qty 5

## 2020-04-21 MED ORDER — CALCITRIOL 0.25 MCG PO CAPS
ORAL_CAPSULE | ORAL | Status: AC
  Filled 2020-04-21: qty 1

## 2020-04-21 MED ORDER — CALCIUM CARBONATE ANTACID 500 MG PO CHEW
CHEWABLE_TABLET | ORAL | Status: AC
  Filled 2020-04-21: qty 2

## 2020-04-21 MED ORDER — VANCOMYCIN HCL IN DEXTROSE 500-5 MG/100ML-% IV SOLN
500.0000 mg | INTRAVENOUS | Status: DC
  Filled 2020-04-21 (×2): qty 100

## 2020-04-21 MED ORDER — CALCIUM GLUCONATE-NACL 2-0.675 GM/100ML-% IV SOLN
2.0000 g | Freq: Once | INTRAVENOUS | Status: AC
  Administered 2020-04-21: 2000 mg via INTRAVENOUS
  Filled 2020-04-21: qty 100

## 2020-04-21 NOTE — Progress Notes (Signed)
Pharmacy Antibiotic Note  Jasmine White is a 74 y.o. female admitted on 04/16/2020 with pneumonia.  Patient is ESRD receiving HD on MWF prior to admit. Extra session received 7/27 with last HD on 7/29. Plans noted for TTS HD while on plasmapheresis dthen back to MWF.  Patient currently afebrile, WBC stable but up slightly to 18 today.   Vancomycin random level 14 this morning (target level 15-25).    Weight: 59 kg (130 lb 1.1 oz)  Temp (24hrs), Avg:97.9 F (36.6 C), Min:97.5 F (36.4 C), Max:98.2 F (36.8 C)  Recent Labs  Lab 04/16/20 0817 04/16/20 0817 04/17/20 0140 04/19/20 0620 04/19/20 0816 04/20/20 0709 04/20/20 1710 04/21/20 0051  WBC 13.8*  --  14.9* 17.6*  --   --  18.6* 18.9*  CREATININE 7.23*   < > 4.35* 5.51* 5.63* 3.67*  --  4.69*  VANCORANDOM  --   --   --   --   --   --   --  14   < > = values in this interval not displayed.    Estimated Creatinine Clearance: 9.8 mL/min (A) (by C-G formula based on SCr of 4.69 mg/dL (H)).    Allergies  Allergen Reactions  . Hctz [Hydrochlorothiazide] Other (See Comments)    Hx of severe Hyponatremia while taking HCTZ - now contraindicated  . Percocet [Oxycodone-Acetaminophen] Nausea Only  . Ultram [Tramadol] Nausea Only    Makes her crazy and nauseated   Plan: Planning HD today - will order standard 500mg  with HD TTS Continue cefepime 1 g q24h to be given daily at 2000 (to attempt to be after HD)  Erin Hearing PharmD., BCPS Clinical Pharmacist 04/21/2020 7:21 AM

## 2020-04-21 NOTE — Progress Notes (Signed)
Progress Note    Jasmine White  HER:[id number] DOB: 1946/05/29  DOA: 04/16/2020 PCP: Chevis Pretty, FNP    Brief Narrative:     Medical records reviewed and are as summarized below:  Jasmine White is an 74 y.o. female with recent hospitalization from 6/26-7/20 for AKI due to ANCA positive RPGN now on HD via right PermCath MWF, HTN, hypothyroidism, HLD and osteoarthritis presenting with dyspnea, orthopnea and hemoptysis and admitted for acute respiratory failure with hypoxia likely due to pulmonary edema, possible pneumonia and vasculitis.  CXR concerning for multifocal pneumonia.  High-resolution CT chest with very extensive, dense and somewhat geographic heterogeneous and consolidative airspace disease throughout the lung with moderate right and small left pleural effusion.  She desaturated to 75% lying down for CT scan requiring NRB.  She received emergent HD with ultrafiltration of 3.6 L and improvement in her breathing.  She was eventually weaned to 5 L.  She was also started on broad-spectrum antibiotics for pneumonia and a steroid for possible pulmonary vasculitis.  PCCM and nephrology following and appreciate their help with this complicated patient.    Assessment/Plan:   Active Problems:   Essential hypertension, benign   Hypothyroidism   Chronic back pain   Hyponatremia   Acute renal failure (HCC)   Acute respiratory failure with hypoxia (HCC)   Hypokalemia   HAP (hospital-acquired pneumonia)   Acute pulmonary edema (HCC)   Acute respiratory failure with hypoxia -primarily due to pulmonary edema with possible concomitant pneumonia and vasculitis.  Has some leukocytosis which could be due to steroid.   - hemoptysis lessening - CXR concerning for multifocal pneumonia.  -High-resolution CT chest with very extensive, dense and somewhat geographic heterogeneous and consolidative airspace disease throughout the lung with moderate right and small left pleural  effusion.   -worsening O2 status and hemoptysis overnight 7/28-7/29- plan to do pulse steroids- IV x 3 days (7/29) then PO, ritux (first dose 7/29, next dose 8/12, and plasmapheresis (7/30, 7/31, 8/2, 8/4, 8/6, 8/9, 8/11)  Pulmonary edema with moderate right pleural effusion:  -continue HD for volume removal as she makes very little to no urine  Hospital-acquired pneumonia?  Patient has some leukocytosis but no fever.  Leukocytosis could be demargination from steroid.  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 -HD per nephrology -Solu-Medrol as above.  Anemia of renal disease: Hgb 8.1 (about baseline) -Continue monitoring -ESA and IV iron per nephrology  Uncontrolled hypertension  -Fluid management with dialysis -Continue home metoprolol with as needed hydralazine  Hypothyroidism -Continue home Synthroid  Secondary hyperparathyroidism -Per nephrology  Hyponatremia -Per nephrology  Chronic back pain/mood disorder -Continue home amitriptyline    Family Communication/Anticipated D/C date and plan/Code Status   DVT prophylaxis: heparin on hold due to hemoptysis- use SCDs for now Code Status: Full Code.  Disposition Plan: Status is: Inpatient  Remains inpatient appropriate because:IV treatments appropriate due to intensity of illness or inability to take PO   Dispo: The patient is from: Home              Anticipated d/c is to: Home  Anticipated d/c date is: > 3 days              Patient  currently is not medically stable to d/c.         Medical Consultants:    Renal  PCCM    Subjective:   Had HD this AM, no further hemoptysis, has taken off O2 briefly at rest  Objective:    Vitals:   04/21/20 1239 04/21/20 1247 04/21/20 1251 04/21/20 1338  BP: (!) 136/82 (!) 130/80 (!) 132/72 (!) 140/77  Pulse: (!) 112 (!) 120 (!) 108 (!) 110  Resp: (!) 29 (!) 28 (!) 28 19  Temp: 97.6 F (36.4 C) 97.6 F (36.4 C) 97.6 F (36.4 C) 98.5 F (36.9 C)  TempSrc:    Oral  SpO2: 100% 100% 99% 100%  Weight:        Intake/Output Summary (Last 24 hours) at 04/21/2020 1457 Last data filed at 04/21/2020 1053 Gross per 24 hour  Intake 240 ml  Output 3600 ml  Net -3360 ml   Filed Weights   04/21/20 0645 04/21/20 1053 04/21/20 1200  Weight: 59 kg 55.4 kg 55.4 kg    Exam:   General: Appearance:    Thin female in no acute distress     Lungs:     At rest off O2, good cap refil, no wheezing but crackles  Heart:    Normal heart rate. Normal rhythm. No murmurs, rubs, or gallops.   MS:   All extremities are intact.   Neurologic:   Awake, alert, oriented x 3.      Data Reviewed:   I have personally reviewed following labs and imaging studies:  Labs: Labs show the following:   Basic Metabolic Panel: Recent Labs  Lab 04/17/20 0140 04/17/20 0140 04/19/20 0620 04/19/20 0620 04/19/20 0816 04/19/20 0816 04/20/20 0709 04/21/20 0051  NA 131*  --  132*  --  133*  --  135 132*  K 4.4   < > 4.3   < > 4.1   < > 4.4 3.9  CL 95*  --  95*  --  96*  --  97* 93*  CO2 22  --  21*  --  23  --  22 22  GLUCOSE 181*  --  128*  --  139*  --  179* 246*  BUN 34*  --  51*  --  50*  --  36* 55*  CREATININE 4.35*  --  5.51*  --  5.63*  --  3.67* 4.69*  CALCIUM 7.5*  --  8.7*  --  8.7*  --  8.4* 8.5*  PHOS  --   --   --   --  5.6*  --   --   --    < > = values in this interval not displayed.   GFR Estimated Creatinine Clearance: 9.2 mL/min (A) (by C-G formula based on SCr of  4.69 mg/dL (H)). Liver Function Tests: Recent Labs  Lab 04/16/20 0817 04/19/20 0620 04/19/20 0816  AST 26 26  --   ALT 15 12  --   ALKPHOS 55 64  --   BILITOT 0.6 0.9  --   PROT 5.8* 5.7*  --   ALBUMIN 3.0* 2.6* 2.6*   No results for input(s): LIPASE, AMYLASE in the last 168 hours. No results for input(s): AMMONIA in the last 168 hours. Coagulation profile No results for input(s): INR, PROTIME in the last 168 hours.  CBC: Recent Labs  Lab  04/16/20 0817 04/16/20 0817 04/16/20 1048 04/17/20 0140 04/19/20 0620 04/20/20 1710 04/21/20 0051  WBC 13.8*  --   --  14.9* 17.6* 18.6* 18.9*  NEUTROABS  --   --   --   --  16.2*  --   --   HGB 8.4*   < > 8.8* 8.1* 8.0* 7.8* 7.5*  HCT 26.0*   < > 26.0* 24.8* 24.8* 23.8* 23.3*  MCV 94.5  --   --  95.4 95.0 93.7 94.3  PLT 197  --   --  164 169 131* 142*   < > = values in this interval not displayed.   Cardiac Enzymes: No results for input(s): CKTOTAL, CKMB, CKMBINDEX, TROPONINI in the last 168 hours. BNP (last 3 results) No results for input(s): PROBNP in the last 8760 hours. CBG: Recent Labs  Lab 04/20/20 2017  GLUCAP 218*   D-Dimer: No results for input(s): DDIMER in the last 72 hours. Hgb A1c: No results for input(s): HGBA1C in the last 72 hours. Lipid Profile: No results for input(s): CHOL, HDL, LDLCALC, TRIG, CHOLHDL, LDLDIRECT in the last 72 hours. Thyroid function studies: No results for input(s): TSH, T4TOTAL, T3FREE, THYROIDAB in the last 72 hours.  Invalid input(s): FREET3 Anemia work up: No results for input(s): VITAMINB12, FOLATE, FERRITIN, TIBC, IRON, RETICCTPCT in the last 72 hours. Sepsis Labs: Recent Labs  Lab 04/16/20 0817 04/16/20 1644 04/17/20 0140 04/19/20 0620 04/20/20 1710 04/21/20 0051  PROCALCITON  --  0.68  --   --   --   --   WBC   < >  --  14.9* 17.6* 18.6* 18.9*   < > = values in this interval not displayed.    Microbiology Recent Results (from the past 240 hour(s))  SARS  Coronavirus 2 by RT PCR (hospital order, performed in [facility] hospital lab) Nasopharyngeal Nasopharyngeal Swab     Status: None   Collection Time: 04/16/20  9:11 AM   Specimen: Nasopharyngeal Swab  Result Value Ref Range Status   SARS Coronavirus 2 NEGATIVE NEGATIVE Final    Comment: (NOTE) SARS-CoV-2 target nucleic acids are NOT DETECTED.  The SARS-CoV-2 RNA is generally detectable in upper and lower respiratory specimens during the acute phase of infection. The lowest concentration of SARS-CoV-2 viral copies this assay can detect is 250 copies / mL. A negative result does not preclude SARS-CoV-2 infection and should not be used as the sole basis for treatment or other patient management decisions.  A negative result may occur with improper specimen collection / handling, submission of specimen other than nasopharyngeal swab, presence of viral mutation(s) within the areas targeted by this assay, and inadequate number of viral copies (<250 copies / mL). A negative result must be combined with clinical observations, patient history, and epidemiological information.  Fact Sheet for Patients:   StrictlyIdeas.no  Fact Sheet for Healthcare Providers: BankingDealers.co.za  This test is not yet approved or  cleared by the [place] FDA and has been authorized for detection and/or diagnosis of SARS-CoV-2 by FDA under an Emergency Use Authorization (EUA).  This EUA will remain in effect (meaning this test can be used) for the duration of the COVID-19 declaration under Section 564(b)(1) of the Act, 21 U.S.C. section 360bbb-3(b)(1), unless the authorization is terminated or revoked sooner.  Performed at [facility], [place] [address], [place], [place] [address]     Procedures and diagnostic studies:  No results found.  Medications:   . acetaminophen      .  amitriptyline  50 mg Oral QHS  . calcitRIOL      . calcitRIOL  0.25  mcg Oral Q M,W,F-HD  . Chlorhexidine Gluconate Cloth  6 each Topical Q0600  . cholecalciferol  1,000 Units Oral Daily  . darbepoetin (ARANESP) injection - DIALYSIS  100 mcg Intravenous Q Sat-HD  . levothyroxine  88 mcg Oral QAC breakfast  . metoprolol succinate  100 mg Oral Daily  . multivitamin  1 tablet Oral QHS  . pantoprazole  40 mg Oral Daily  . potassium chloride  20 mEq Oral Once  . [START ON 04/22/2020] predniSONE  60 mg Oral Q breakfast  . sodium chloride flush  3 mL Intravenous Once  . sulfamethoxazole-trimethoprim  1 tablet Oral Once per day on Mon Wed Fri  . vancomycin  500 mg Intravenous Q T,Th,Sa-HD  . vancomycin variable dose per unstable renal function (pharmacist dosing)   Does not apply See admin instructions   Continuous Infusions: . anticoagulant sodium citrate    . ceFEPime (MAXIPIME) IV 1 g (04/20/20 2045)  . famotidine (PEPCID) IV    . methylPREDNISolone (SOLU-MEDROL) injection 500 mg (04/20/20 1618)  . sodium chloride       LOS: 5 days   Geradine Girt  [facility]   How to contact the [facility] Attending or Consulting provider [place] or covering provider during after hours [place], for this patient?  1. Check the care team in [facility] and look for a) attending/consulting [facility] provider listed and b) the [facility] team listed 2. Log into [email or web address] and use [place]'s universal password to access. If you do not have the password, please contact the hospital operator. 3. Locate the [facility] provider you are looking for under [facility] and page to a number that you can be directly reached. 4. If you still have difficulty reaching the provider, please page the [facility] (Director on Call) for the Hospitalists listed on amion for assistance.  04/21/2020, 2:57 PM

## 2020-04-21 NOTE — Progress Notes (Signed)
NAME:  Jasmine White, MRN:  [id number], DOB:  11-24-1945, LOS: 5 ADMISSION DATE:  04/16/2020, CONSULTATION DATE:  04/16/20 REFERRING MD:  Langston Masker - EM, CHIEF COMPLAINT:  SOB + Hypoxia  Brief History   74 yo F w/ ANCA vasculitis on chronic immunosuppression presents to ED 7/26 with SOB, hypoxia. Oscillating O2 needs between 2L and NRB- for comfort.   History of present illness   74 yo F PMH ANCA vasculitis,renal failure on HD,  pulmonary vasculitis, HTN, HLD, prior COVID infection who presents to ED with SOB. Notably, patient recently admitted 6/26-7/20 with progressive renal failure, respiratory failure. Received plex, cytoxan, steroids, bactrim, requiring intubation 7/15 but successfully extubated 7/18.  She presents to ED with SOB which began the night prior to presentation. SOB was worse laying flat and improved when seated. Associated intermittent cough with colorless-to-tan sputum, with some blood tinged streaks. No missed HD sessions, due for HD day of ED presentation. No sick contacts, no fever chills, wheezing, no chest pain, no palpitations, no HA, dizziness.   In ED, became hypoxic when flat during CT chest, required NRB but has been down-tirtated to [facility]. PCCM and nephrology asked to evaluate in ED. ED labs: WBC 13.8, Na 131 K 3.3, ABG 7.4/ 39/ 114/ 26/ 0/ 24.9/ 99  Past Medical History  ANCA vasculitis Renal Failure on HD Pulmonary vasculitis  Prior COVID-19 infection  HTN HLD Hypothyroidism  Significant Hospital Events   7/26 Presents to ED with SOB + hypoxia   Consults:  PCCM Nephrology  Procedures:    Significant Diagnostic Tests:  7/26 HRCT> bilateral pleural effusions, R>L. Bilateral ASD, suspicious for PNA and pulmonary edema. Interlobular septal thickening.   Micro Data:  7/26 SARS Cov2> neg 7/26 RVP > not done 7/26 Sputum cx> > not done  Antimicrobials:  7/26 Vanc> 7/26 Cefepime>  7/26 cipro > 7/28 7/26 bactrim per pharm>   Interim  history/subjective:  Patient up to chair markedly improved, weaned to room air and comfortable No more hemoptysis since presentation Starting to have some urine output Objective   Blood pressure (!) 133/72, pulse 96, temperature 97.9 F (36.6 C), temperature source Oral, resp. rate 22, weight 55.4 kg, SpO2 96 %.        Intake/Output Summary (Last 24 hours) at 04/21/2020 1703 Last data filed at 04/21/2020 1659 Gross per 24 hour  Intake 788 ml  Output 3600 ml  Net -2812 ml   Filed Weights   04/21/20 0645 04/21/20 1053 04/21/20 1200  Weight: 59 kg 55.4 kg 55.4 kg    Examination: General Well-appearing, conversant: Oropharynx clear HENT: NCAT; oropharynx Neck: Trachea midline; FROM, supple, lymphadenopathy, no JVD Lungs: Clear bilaterally CV: Regular, 70s, no murmur Abdomen: Stented with positive bowel sounds Extremities: No edema Skin: No rash Psych: Appropriate Neuro: Nonfocal    Resolved Hospital Problem list     Assessment & Plan:  Pulmonary Renal Syndrome  Anti-GBM disease  ANCA, +MPO AB  RPGN now esrd on iHD  Recurrent hemoptysis  AHRF, 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  She will likely go back on Cytoxan at discharge  ANCA vasculitis/Anti-GBM disease:  ESRD on HD MWF -no  missed HD sessions, making very little urine since last HD 7/23 P prednisone 60mg  qd off cytoxan currently (home med) Volume removal w HD Beginning to make some urine, hopefully she will achieve renal recovery, unclear at this time.   Pulmonary will follow on Monday   Baltazar Apo, MD, PhD 04/21/2020, 5:07 PM [facility] [phone] or if no answer [phone]

## 2020-04-21 NOTE — Progress Notes (Signed)
[facility] Progress Note    A/P 1.  Dyspnea/hypoxic respiratory failure: secondary to recurrence of alveolitis/DAH, no significant improvement with ultrafiltration/antibiotics with recurrent hemoptysis.  Renal discussed with CCM on 7/29.  Holding off on repeat bronch for now given likelihood of DAH/alveolitis on imaging and high risk for intubation.  2.  Dialysis dependent acute kidney injury: Anuric and without any evidence of renal recovery to date.  - Plan for HD per TTS schedule and will need to revert back to MWF once done with PLEX x 7 treatments - given that she very minimal urine output along with 100% crescents on biopsy there is a very low likelihood for renal recovery  3. Anti-GBM/RPGN    Incidentally also with a positive antinuclear antibody and antidouble-stranded DNA (likely false positives). -Anti-GBM sent 7/29 and returned at 63.  Reinitiated pulse steroids with Solu-Medrol 500 mg x 3 days (started 7/29 and last dose 7/31). S/p cytoxan and given rituxan on 7/29.    - Last dose of solumedrol today, 7/31.  Ordered to then resume prednisone 60 mg daily on 8/1 onward -Will need repeat dose of rituxan on 8/12  (2 weeks after 1st dose).  Should plan for Rituxan panel 3 months thereafter - She was resumed on plasmapheresis for 7 treatments - continue plasmapheresis.  (Tentatively planned for 7/30, 7/31, 8/2, 8/4, 8/6, 8/9, 8/11.)  Utilizing FFP  3. Anemia 2/2 CKD: Hemoglobin and hematocrit drifting downwards, likely secondary to critical illness.  Status post ESA on 7/23 and ordered weekly dosing 7/31 with aranesp at 100 mcg for now.  Note hx of reported hemoptysis -no heparin with HD   4. CKD-MBD: Calcium level at goal. Continue VDRA for PTH control.  5. Nutrition: Continue renal diet with ongoing fluid restriction and renal multivitamin.  6. Hypertension: improving with HD   SUBJECTIVE: Hasn't had any hemoptysis since 7/29 PM she states.  Feels well.  Pheresis  yesterday and last HD on 7/29 with 3 kg UF.  Seen and examined on HD at 6:58 am.  BF acceptable.  145/83.  Tolerating goal   Review of systems:   Reports some shortness of breath with exertion but not at rest.  Denies any chest pain, nausea/vomiting.  Urinated some yesterday and had a BM.    OBJECTIVE:  Filed Weights   04/19/20 0527 04/19/20 0745 04/19/20 1150  Weight: 61.2 kg 60.4 kg 57.4 kg  BP (!) 134/60 (BP Location: Right Arm)   Pulse 52   Temp 97.9 F (36.6 C) (Oral)   Resp 17   Wt 57.4 kg   SpO2 95%   BMI 20.42 kg/m    Gen: nad, sitting up in bed   Resp: clearer to auscultation; unlabored. No coughing during exam  CV: reg rate, S1-S2 no rub Abd: soft, nt/nondistended Ext: no edema, lue avf with good thrill/bruit Skin: Right IJ tunneled dialysis catheter: Clear/dry/intact/dressing in place Neuro: awake, speech clear and coherent, moves all ext spontaneously psych normal mood and affect  Recent Labs  Lab 04/19/20 0816 04/20/20 0709 04/21/20 0051  NA 133*   < > 132*  K 4.1   < > 3.9  CL 96*   < > 93*  CO2 23   < > 22  GLUCOSE 139*   < > 246*  BUN 50*   < > 55*  CREATININE 5.63*   < > 4.69*  CALCIUM 8.7*   < > 8.5*  PHOS 5.6*  --   --    < > =  values in this interval not displayed.    Recent Labs  Lab 04/19/20 0620 04/20/20 1710 04/21/20 0051  WBC 17.6* 18.6* 18.9*  NEUTROABS 16.2*  --   --   HGB 8.0* 7.8* 7.5*  HCT 24.8* 23.8* 23.3*  MCV 95.0 93.7 94.3  PLT 169 131* 142*    Scheduled Meds: . amitriptyline  50 mg Oral QHS  . calcitRIOL  0.25 mcg Oral Q M,W,F-HD  . Chlorhexidine Gluconate Cloth  6 each Topical Q0600  . cholecalciferol  1,000 Units Oral Daily  . levothyroxine  88 mcg Oral QAC breakfast  . metoprolol succinate  100 mg Oral Daily  . multivitamin  1 tablet Oral QHS  . pantoprazole  40 mg Oral Daily  . potassium chloride  20 mEq Oral Once  . sodium chloride flush  3 mL Intravenous Once  . sulfamethoxazole-trimethoprim  1 tablet Oral  Once per day on Mon Wed Fri  . vancomycin variable dose per unstable renal function (pharmacist dosing)   Does not apply See admin instructions   Continuous Infusions: . anticoagulant sodium citrate    . ceFEPime (MAXIPIME) IV 1 g (04/20/20 2045)  . famotidine (PEPCID) IV    . methylPREDNISolone (SOLU-MEDROL) injection 500 mg (04/20/20 1618)  . sodium chloride     PRN Meds:.acetaminophen **OR** acetaminophen, albuterol, albuterol, anticoagulant sodium citrate, chlorpheniramine-HYDROcodone, diphenhydrAMINE, EPINEPHrine, famotidine (PEPCID) IV, hydrALAZINE, HYDROcodone-acetaminophen, ipratropium-albuterol, methylPREDNISolone (SOLU-MEDROL) injection, ondansetron **OR** ondansetron (ZOFRAN) IV, sodium chloride, zolpidem     Claudia Desanctis, MD [employer] 04/21/2020, 6:59 AM

## 2020-04-22 LAB — THERAPEUTIC PLASMA EXCHANGE (BLOOD BANK)
Plasma Exchange: 3000
Plasma volume needed: 3000
Unit division: 0
Unit division: 0
Unit division: 0
Unit division: 0
Unit division: 0
Unit division: 0
Unit division: 0
Unit division: 0
Unit division: 0

## 2020-04-22 LAB — GLUCOSE, CAPILLARY
Glucose-Capillary: 215 mg/dL — ABNORMAL HIGH (ref 70–99)
Glucose-Capillary: 174 mg/dL — ABNORMAL HIGH (ref 70–99)
Glucose-Capillary: 133 mg/dL — ABNORMAL HIGH (ref 70–99)

## 2020-04-22 MED ORDER — HEPARIN SODIUM (PORCINE) 5000 UNIT/ML IJ SOLN
5000.0000 [IU] | Freq: Three times a day (TID) | INTRAMUSCULAR | Status: DC
  Administered 2020-04-22 – 2020-05-03 (×30): 5000 [IU] via SUBCUTANEOUS
  Filled 2020-04-22 (×31): qty 1

## 2020-04-22 MED ORDER — INSULIN ASPART 100 UNIT/ML ~~LOC~~ SOLN
0.0000 [IU] | Freq: Three times a day (TID) | SUBCUTANEOUS | Status: DC
  Administered 2020-04-22: 1 [IU] via SUBCUTANEOUS
  Administered 2020-04-22: 2 [IU] via SUBCUTANEOUS
  Administered 2020-04-23 – 2020-04-26 (×5): 1 [IU] via SUBCUTANEOUS
  Administered 2020-04-28: 2 [IU] via SUBCUTANEOUS
  Administered 2020-04-29 – 2020-04-30 (×2): 1 [IU] via SUBCUTANEOUS
  Administered 2020-05-01 – 2020-05-02 (×2): 2 [IU] via SUBCUTANEOUS

## 2020-04-22 NOTE — Plan of Care (Signed)

## 2020-04-22 NOTE — Progress Notes (Signed)
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  Previous admission 03/17/20-04/10/2020 with progressive respiratory failure requiring intubation 04/05/2020-04/08/2020.  Patient was on chronic immunosuppression presented to the emergency room 04/16/2020 with respiratory distress.  Diagnosed with recurrence of alveolitis/DAH and recurrent hemoptysis.  Incidental findings of positive ANA and antidouble-stranded DNA(likely false positives).  Anti-GBM 04/19/2022 returned at 62.  Received pulse steroids with Solu-Medrol 500 mg x 3 days (started 04/19/2020-04/21/2020.).  Had received previously Cytoxan 50 mg daily and now has been administered Rituxan on 04/19/2020.  Plan is to continue prednisone 60 mg daily 04/22/2020 Plan is to continue next dose of Rituxan 05/03/2020 (2 weeks after first dose) Plan to resume on plasmapheresis for 7 treatments.  Received successful treatment #2 04/21/2020.  (Tentatively planned for 7/30, 7/31, 8/2, 8/4, 8/6, 8/9, 8/11.)  Utilizing FFP. Received dialysis 04/21/2020 with 3.6 L removed and weight decreased to 55.4 kg  Blood pressure 135/76 pulse 94 temperature 98.2 O2 sats 93% room.  Ultrafiltration 3.6 L.  Sodium 132 potassium 3.9 chloride 93 CO2 22 BUN 55 creatinine 4.69 glucose 246 calcium 8.5 hemoglobin 7.5 WBC 18.9 platelets 142.  Calcitriol 0.25 mcg Monday Wednesday Friday, vitamin D3 1000 units daily, darbepoetin 100 mcg 04/21/2020 weekly, levothyroxine 88 mcg daily, metoprolol  100 mg daily, multivitamins 1 daily Protonix 40 mg day, Bactrim 1 tablet Monday Wednesday Friday,  prednisone 60 mg daily started 04/22/2020   vancomycin 500 mg Tuesday Thursday Saturday, cefepime 1 g every 24 hours.  Rituximab 1 g administered 04/19/2020     Objective:  Vital signs in last 24 hours:  Temp:  [97.6 F (36.4 C)-98.6 F (37 C)] 98.2 F  (36.8 C) (08/01 0508) Pulse Rate:  [92-120] 94 (08/01 0508) Resp:  [15-30] 15 (08/01 0508) BP: (95-149)/(57-92) 135/76 (08/01 0508) SpO2:  [92 %-100 %] 92 % (08/01 0508) Weight:  [55.4 kg] 55.4 kg (07/31 1200)  Weight change: -3.6 kg Filed Weights   04/21/20 0645 04/21/20 1053 04/21/20 1200  Weight: 59 kg 55.4 kg 55.4 kg    Intake/Output: I/O last 3 completed shifts: In: 8182 [P.O.:920; IV Piggyback:108] Out: 3600 [Other:3600]   Intake/Output this shift:  No intake/output data recorded.  General nondistressed sitting in bed CVS-regular rate and rhythm no murmurs rubs gallops RS-clear to auscultation nonlabored on exam ABD- BS present soft non-distended EXT- no edema right IJ tunneled dialysis catheter with dressing clean and dry   Basic Metabolic Panel: Recent Labs  Lab 04/17/20 0140 04/17/20 0140 04/19/20 0620 04/19/20 0620 04/19/20 0816 04/20/20 0709 04/21/20 0051  NA 131*  --  132*  --  133* 135 132*  K 4.4  --  4.3  --  4.1 4.4 3.9  CL 95*  --  95*  --  96* 97* 93*  CO2 22  --  21*  --  23 22 22   GLUCOSE 181*  --  128*  --  139* 179* 246*  BUN 34*  --  51*  --  50* 36* 55*  CREATININE 4.35*  --  5.51*  --  5.63* 3.67* 4.69*  CALCIUM 7.5*   < > 8.7*   < > 8.7* 8.4* 8.5*  PHOS  --   --   --   --  5.6*  --   --    < > = values in this interval not displayed.    Liver Function Tests: Recent Labs  Lab 04/16/20 0817 04/19/20 0620 04/19/20 0816  AST 26 26  --   ALT 15 12  --   ALKPHOS 55 64  --   BILITOT 0.6 0.9  --   PROT 5.8* 5.7*  --   ALBUMIN 3.0* 2.6* 2.6*   No results for input(s): LIPASE, AMYLASE in the last 168 hours. No results for input(s): AMMONIA in the last 168 hours.  CBC: Recent Labs  Lab 04/16/20 0817 04/16/20 0817 04/16/20 1048 04/17/20 0140 04/19/20 0620 04/20/20 1710 04/21/20 0051  WBC 13.8*  --   --  14.9* 17.6* 18.6* 18.9*  NEUTROABS  --   --   --   --  16.2*  --   --   HGB 8.4*   < > 8.8* 8.1* 8.0* 7.8* 7.5*  HCT 26.0*    < > 26.0* 24.8* 24.8* 23.8* 23.3*  MCV 94.5  --   --  95.4 95.0 93.7 94.3  PLT 197  --   --  164 169 131* 142*   < > = values in this interval not displayed.    Cardiac Enzymes: No results for input(s): CKTOTAL, CKMB, CKMBINDEX, TROPONINI in the last 168 hours.  BNP: Invalid input(s): POCBNP  CBG: Recent Labs  Lab 04/20/20 2017  GLUCAP 218*    Microbiology: Results for orders placed or performed during the hospital encounter of 04/16/20  SARS Coronavirus 2 by RT PCR (hospital order, performed in [facility] hospital lab) Nasopharyngeal Nasopharyngeal Swab     Status: None   Collection Time: 04/16/20  9:11 AM   Specimen: Nasopharyngeal Swab  Result Value Ref Range Status   SARS Coronavirus 2 NEGATIVE NEGATIVE Final    Comment: (NOTE) SARS-CoV-2 target nucleic acids are NOT DETECTED.  The SARS-CoV-2 RNA is generally detectable in upper and lower respiratory specimens during the acute phase of infection. The lowest concentration of SARS-CoV-2 viral copies this assay can detect is 250 copies / mL. A negative result does not preclude SARS-CoV-2 infection and should not be used as the sole basis for treatment or other patient management decisions.  A negative result may occur with improper specimen collection / handling, submission of specimen other than nasopharyngeal swab, presence of viral mutation(s) within the areas targeted by this assay, and inadequate number of viral copies (<250 copies / mL). A negative result must be combined with clinical observations, patient history, and epidemiological information.  Fact Sheet for Patients:   StrictlyIdeas.no  Fact Sheet for Healthcare Providers: BankingDealers.co.za  This test is not yet approved or  cleared by the [place] FDA and has been authorized for detection and/or diagnosis of SARS-CoV-2 by FDA under an Emergency Use Authorization (EUA).  This EUA will remain in  effect (meaning this test can be used) for the duration of the COVID-19 declaration under Section 564(b)(1) of the Act, 21 U.S.C. section 360bbb-3(b)(1), unless the authorization is terminated or revoked sooner.  Performed at [facility], [place] [address], [place], [place] [address]     Coagulation Studies: No results for input(s): LABPROT, INR in the last 72 hours.  Urinalysis: No results for input(s): COLORURINE, LABSPEC, PHURINE, GLUCOSEU, HGBUR, BILIRUBINUR, KETONESUR, PROTEINUR, UROBILINOGEN, NITRITE, LEUKOCYTESUR in the last 72 hours.  Invalid input(s): APPERANCEUR    Imaging: No results found.   Medications:    anticoagulant sodium citrate     ceFEPime (MAXIPIME) IV 1 g (04/21/20 2048)   famotidine (PEPCID) IV     sodium chloride      amitriptyline  50 mg Oral QHS   calcitRIOL  0.25 mcg Oral Q M,W,F-HD   Chlorhexidine Gluconate Cloth  6 each Topical Q0600   cholecalciferol  1,000 Units Oral Daily   darbepoetin (ARANESP) injection - DIALYSIS  100 mcg Intravenous Q Sat-HD   levothyroxine  88 mcg Oral QAC breakfast   metoprolol succinate  100 mg Oral Daily   multivitamin  1 tablet Oral QHS   pantoprazole  40 mg Oral Daily   potassium chloride  20 mEq Oral Once   predniSONE  60 mg Oral Q breakfast   sodium chloride flush  3 mL Intravenous Once   sulfamethoxazole-trimethoprim  1 tablet Oral Once per day on Mon Wed Fri   vancomycin  500 mg Intravenous Q T,Th,Sa-HD   vancomycin variable dose per unstable renal function (pharmacist dosing)   Does not apply See admin instructions   acetaminophen **OR** acetaminophen, albuterol, albuterol, anticoagulant sodium citrate, chlorpheniramine-HYDROcodone, diphenhydrAMINE, EPINEPHrine, famotidine (PEPCID) IV, hydrALAZINE, HYDROcodone-acetaminophen, ipratropium-albuterol, methylPREDNISolone (SOLU-MEDROL) injection, ondansetron **OR** ondansetron (ZOFRAN) IV, sodium chloride, zolpidem  Assessment/ Plan:  1.  Dyspnea/hypoxic respiratory failure:secondary to recurrence of alveolitis/DAH, no significant improvement with ultrafiltration/antibiotics with recurrent hemoptysis. .  Holding off on repeat bronch for now given likelihood of DAH/alveolitis on imaging and high risk for intubation.  Continue with aggressive ultrafiltration to optimize euvolemia and fluid balance.  2. Dialysis dependent acute kidney injury: Anuric and without any evidence of renal recovery to date.  - Plan for HD per TTS schedule and will need to revert back to MWF once done with PLEX x 7 treatments - given that she very minimal urine output along with 100% crescents on biopsy there is a very low likelihood for renal recovery 03/22/2020.  Also double positive for anti-GBM and ANCA vasculitis.  3. Anti-GBM/RPGN   Incidentally also with a positive antinuclear antibody and antidouble-stranded DNA (likely false positives). -Anti-GBM sent 7/29 and returned at 62.  Reinitiated pulse steroids with Solu-Medrol 500 mg x 3 days (started 7/29 and last dose 7/31). S/p cytoxan previously and now given rituxan on 7/29.    - Last dose of solumedrol today, 7/31.  Ordered to then resume prednisone 60 mg daily on 8/1 onward -Will need repeat dose of rituxan on 8/12  (2 weeks after 1st dose).  Should plan for Rituxan panel 3 months thereafter - She was resumed on plasmapheresis for 7 treatments - continue plasmapheresis.  (Tentatively planned for 7/30, 7/31, 8/2, 8/4, 8/6, 8/9, 8/11.)  Utilizing FFP  3. Anemia 2/2 GGY:IRSWNIOEVO and hematocrit drifting downwards, likely secondary to critical illness. Status post ESA on 7/23 and ordered weekly dosing 7/31 with aranesp at 100 mcg for now. Note hx of reported hemoptysis -no heparin with HD   4. CKD-MBD: Calcium level at goal. Continue VDRA for PTH control.  5. Nutrition: Continue renal diet with ongoing fluid restriction and renal multivitamin.  6. Hypertension: improving with HD  7.   Hyperglycemia -most likely as result of high-dose steroids.  Will defer management to critical care service   LOS: [place] @TODAY @7 :40 AM

## 2020-04-22 NOTE — Progress Notes (Signed)
Progress Note    Jasmine White  GYI:[id number] DOB: July 04, 1946  DOA: 04/16/2020 PCP: Chevis Pretty, FNP    Brief Narrative:     Medical records reviewed and are as summarized below:  Jasmine White is an 74 y.o. female with recent hospitalization from 6/26-7/20 for AKI due to ANCA positive RPGN now on HD via right PermCath MWF, HTN, hypothyroidism, HLD and osteoarthritis presenting with dyspnea, orthopnea and hemoptysis and admitted for acute respiratory failure with hypoxia likely due to pulmonary edema, possible pneumonia and vasculitis.  CXR concerning for multifocal pneumonia.  High-resolution CT chest with very extensive, dense and somewhat geographic heterogeneous and consolidative airspace disease throughout the lung with moderate right and small left pleural effusion.  She desaturated to 75% lying down for CT scan requiring NRB.  She received emergent HD with ultrafiltration of 3.6 L and improvement in her breathing.  She was eventually weaned to 5 L.  She was also started on broad-spectrum antibiotics for pneumonia and a steroid for possible pulmonary vasculitis.  PCCM and nephrology following and appreciate their help with this complicated patient.    Assessment/Plan:   Active Problems:   Essential hypertension, benign   Hypothyroidism   Chronic back pain   Hyponatremia   Acute renal failure (HCC)   Acute respiratory failure with hypoxia (HCC)   Hypokalemia   HAP (hospital-acquired pneumonia)   Acute pulmonary edema (HCC)   ANCA-associated vasculitis (HCC)   Acute respiratory failure with hypoxia -primarily due to pulmonary edema with possible concomitant pneumonia and vasculitis.  Has some leukocytosis which could be due to steroid.   - hemoptysis appears resolved-- will resume heparin - CXR concerning for multifocal pneumonia.  -High-resolution CT chest with very extensive, dense and somewhat geographic heterogeneous and consolidative airspace disease  throughout the lung with moderate right and small left pleural effusion.   -worsening O2 status and hemoptysis overnight 7/28-7/29- plan to do pulse steroids- IV x 3 days (7/29) then PO, ritux (first dose 7/29, next dose 8/12, and plasmapheresis (7/30, 7/31, 8/2, 8/4, 8/6, 8/9, 8/11)  Pulmonary edema with moderate right pleural effusion:  -continue HD for volume removal as she makes very little to no urine  Hospital-acquired pneumonia?  Patient has some leukocytosis but no fever.  Leukocytosis could be demargination from steroid.  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 -HD per nephrology -Solu-Medrol as above.  Anemia of renal disease: Hgb 8.1 (about baseline) -Continue monitoring -ESA and IV iron per nephrology  Uncontrolled hypertension  -Fluid management with dialysis -Continue home metoprolol with as needed hydralazine  Hypothyroidism -Continue home Synthroid  Secondary hyperparathyroidism -Per nephrology  Hyponatremia -Per nephrology  Chronic back pain/mood disorder -Continue home amitriptyline    Family Communication/Anticipated D/C date and plan/Code Status   DVT prophylaxis: resume heparin and monitor hgb and hemoptysis Code Status: Full Code.  Disposition Plan: Status is: Inpatient  Remains inpatient appropriate because:IV treatments appropriate due to intensity of illness or inability to take PO   Dispo: The patient is from: Home              Anticipated d/c is to:  Home               Anticipated d/c date is: > 3 days              Patient currently is not medically stable to d/c.         Medical Consultants:    Renal  PCCM    Subjective:   Off O2 and feels well, no further episodes of hemoptysis  Objective:    Vitals:   04/22/20 0406 04/22/20 0508 04/22/20 0828 04/22/20 1041  BP:  (!) 135/76 122/67 (!) 141/80  Pulse:  94 (!) 106   Resp:  15 20 19   Temp:  98.2 F (36.8 C) 98.2 F (36.8 C) 98.3 F (36.8 C)  TempSrc:  Oral Oral Oral  SpO2: 94% 92% 94%   Weight:        Intake/Output Summary (Last 24 hours) at 04/22/2020 1146 Last data filed at 04/22/2020 0800 Gross per 24 hour  Intake 1008 ml  Output --  Net 1008 ml   Filed Weights   04/21/20 0645 04/21/20 1053 04/21/20 1200  Weight: 59 kg 55.4 kg 55.4 kg    Exam:  General: Appearance:    Thin female in no acute distress     Lungs:     No wheezing, no O2  Heart:    Tachycardic - mild  MS:   All extremities are intact.   Neurologic:   Awake, alert, oriented x 3. No apparent focal neurological           defect.       Data Reviewed:   I have personally reviewed following labs and imaging studies:  Labs: Labs show the following:   Basic Metabolic Panel: Recent Labs  Lab 04/17/20 0140 04/17/20 0140 04/19/20 0620 04/19/20 0620 04/19/20 0816 04/19/20 0816 04/20/20 0709 04/21/20 0051  NA 131*  --  132*  --  133*  --  135 132*  K 4.4   < > 4.3   < > 4.1   < > 4.4 3.9  CL 95*  --  95*  --  96*  --  97* 93*  CO2 22  --  21*  --  23  --  22 22  GLUCOSE 181*  --  128*  --  139*  --  179* 246*  BUN 34*  --  51*  --  50*  --  36* 55*  CREATININE 4.35*  --  5.51*  --  5.63*  --  3.67* 4.69*  CALCIUM 7.5*  --  8.7*  --  8.7*  --  8.4* 8.5*  PHOS  --   --   --   --  5.6*  --   --   --    < > = values in this interval not displayed.   GFR Estimated Creatinine Clearance: 9.2 mL/min (A) (by C-G formula based on SCr of 4.69 mg/dL (H)). Liver Function Tests: Recent Labs  Lab  04/16/20 0817 04/19/20 0620 04/19/20 0816  AST 26 26  --   ALT 15 12  --   ALKPHOS 55 64  --   BILITOT 0.6 0.9  --   PROT 5.8* 5.7*  --   ALBUMIN 3.0* 2.6* 2.6*   No results for input(s): LIPASE, AMYLASE in the last 168 hours. No results for input(s): AMMONIA in the last 168 hours. Coagulation profile No results for input(s): INR, PROTIME in the last 168 hours.  CBC: Recent Labs  Lab 04/16/20 [phone]  04/16/20 0817 04/16/20 1048 04/17/20 0140 04/19/20 0620 04/20/20 1710 04/21/20 0051  WBC 13.8*  --   --  14.9* 17.6* 18.6* 18.9*  NEUTROABS  --   --   --   --  16.2*  --   --   HGB 8.4*   < > 8.8* 8.1* 8.0* 7.8* 7.5*  HCT 26.0*   < > 26.0* 24.8* 24.8* 23.8* 23.3*  MCV 94.5  --   --  95.4 95.0 93.7 94.3  PLT 197  --   --  164 169 131* 142*   < > = values in this interval not displayed.   Cardiac Enzymes: No results for input(s): CKTOTAL, CKMB, CKMBINDEX, TROPONINI in the last 168 hours. BNP (last 3 results) No results for input(s): PROBNP in the last 8760 hours. CBG: Recent Labs  Lab 04/20/20 2017  GLUCAP 218*   D-Dimer: No results for input(s): DDIMER in the last 72 hours. Hgb A1c: No results for input(s): HGBA1C in the last 72 hours. Lipid Profile: No results for input(s): CHOL, HDL, LDLCALC, TRIG, CHOLHDL, LDLDIRECT in the last 72 hours. Thyroid function studies: No results for input(s): TSH, T4TOTAL, T3FREE, THYROIDAB in the last 72 hours.  Invalid input(s): FREET3 Anemia work up: No results for input(s): VITAMINB12, FOLATE, FERRITIN, TIBC, IRON, RETICCTPCT in the last 72 hours. Sepsis Labs: Recent Labs  Lab 04/16/20 0817 04/16/20 1644 04/17/20 0140 04/19/20 0620 04/20/20 1710 04/21/20 0051  PROCALCITON  --  0.68  --   --   --   --   WBC   < >  --  14.9* 17.6* 18.6* 18.9*   < > = values in this interval not displayed.    Microbiology Recent Results (from the past 240 hour(s))  SARS Coronavirus 2 by RT PCR (hospital order, performed in [facility]  hospital lab) Nasopharyngeal Nasopharyngeal Swab     Status: None   Collection Time: 04/16/20  9:11 AM   Specimen: Nasopharyngeal Swab  Result Value Ref Range Status   SARS Coronavirus 2 NEGATIVE NEGATIVE Final    Comment: (NOTE) SARS-CoV-2 target nucleic acids are NOT DETECTED.  The SARS-CoV-2 RNA is generally detectable in upper and lower respiratory specimens during the acute phase of infection. The lowest concentration of SARS-CoV-2 viral copies this assay can detect is 250 copies / mL. A negative result does not preclude SARS-CoV-2 infection and should not be used as the sole basis for treatment or other patient management decisions.  A negative result may occur with improper specimen collection / handling, submission of specimen other than nasopharyngeal swab, presence of viral mutation(s) within the areas targeted by this assay, and inadequate number of viral copies (<250 copies / mL). A negative result must be combined with clinical observations, patient history, and epidemiological information.  Fact Sheet for Patients:   StrictlyIdeas.no  Fact Sheet for Healthcare Providers: BankingDealers.co.za  This test is not yet approved or  cleared by the [place] FDA and has been authorized for detection and/or diagnosis of SARS-CoV-2 by FDA under an Emergency Use Authorization (EUA).  This EUA will remain in effect (meaning this test can be used) for the duration of the COVID-19 declaration under Section 564(b)(1) of the Act, 21 U.S.C. section 360bbb-3(b)(1), unless the authorization is terminated or revoked sooner.  Performed at [facility], [place] [address] [place], [place] [address]     Procedures and diagnostic studies:  No results found.  Medications:   . amitriptyline  50 mg Oral QHS  .  calcitRIOL  0.25 mcg Oral Q M,W,F-HD  . Chlorhexidine Gluconate Cloth  6 each Topical Q0600  . cholecalciferol  1,000  Units Oral Daily  . darbepoetin (ARANESP) injection - DIALYSIS  100 mcg Intravenous Q Sat-HD  . heparin injection (subcutaneous)  5,000 Units Subcutaneous Q8H  . levothyroxine  88 mcg Oral QAC breakfast  . metoprolol succinate  100 mg Oral Daily  . multivitamin  1 tablet Oral QHS  . pantoprazole  40 mg Oral Daily  . potassium chloride  20 mEq Oral Once  . predniSONE  60 mg Oral Q breakfast  . sodium chloride flush  3 mL Intravenous Once  . sulfamethoxazole-trimethoprim  1 tablet Oral Once per day on Mon Wed Fri  . vancomycin  500 mg Intravenous Q T,Th,Sa-HD  . vancomycin variable dose per unstable renal function (pharmacist dosing)   Does not apply See admin instructions   Continuous Infusions: . anticoagulant sodium citrate    . ceFEPime (MAXIPIME) IV 1 g (04/21/20 2048)  . famotidine (PEPCID) IV    . sodium chloride       LOS: 6 days   Geradine Girt  [facility]   How to contact the [facility] Attending or Consulting provider [place] or covering provider during after hours [place], for this patient?  1. Check the care team in [facility] and look for a) attending/consulting [facility] provider listed and b) the [facility] team listed 2. Log into [email or web address] and use [place]'s universal password to access. If you do not have the password, please contact the hospital operator. 3. Locate the [facility] provider you are looking for under [facility] and page to a number that you can be directly reached. 4. If you still have difficulty reaching the provider, please page the [facility] (Director on Call) for the Hospitalists listed on amion for assistance.  04/22/2020, 11:46 AM

## 2020-04-23 LAB — POCT I-STAT, CHEM 8
BUN: 87 mg/dL — ABNORMAL HIGH (ref 8–23)
Calcium, Ion: 1.1 mmol/L — ABNORMAL LOW (ref 1.15–1.40)
Chloride: 91 mmol/L — ABNORMAL LOW (ref 98–111)
Creatinine, Ser: 6.2 mg/dL — ABNORMAL HIGH (ref 0.44–1.00)
Glucose, Bld: 142 mg/dL — ABNORMAL HIGH (ref 70–99)
HCT: 49 % — ABNORMAL HIGH (ref 36.0–46.0)
Hemoglobin: 16.7 g/dL — ABNORMAL HIGH (ref 12.0–15.0)
Potassium: 4.4 mmol/L (ref 3.5–5.1)
Sodium: 130 mmol/L — ABNORMAL LOW (ref 135–145)
TCO2: 23 mmol/L (ref 22–32)

## 2020-04-23 LAB — PNEUMOCYSTIS JIROVECI SMEAR BY DFA: Pneumocystis jiroveci Ag: NEGATIVE

## 2020-04-23 LAB — BASIC METABOLIC PANEL
Anion gap: 15 (ref 5–15)
BUN: 70 mg/dL — ABNORMAL HIGH (ref 8–23)
CO2: 25 mmol/L (ref 22–32)
Calcium: 8.4 mg/dL — ABNORMAL LOW (ref 8.9–10.3)
Chloride: 95 mmol/L — ABNORMAL LOW (ref 98–111)
Creatinine, Ser: 5.01 mg/dL — ABNORMAL HIGH (ref 0.44–1.00)
GFR calc Af Amer: 9 mL/min — ABNORMAL LOW (ref >60)
GFR calc non Af Amer: 8 mL/min — ABNORMAL LOW (ref >60)
Glucose, Bld: 119 mg/dL — ABNORMAL HIGH (ref 70–99)
Potassium: 4 mmol/L (ref 3.5–5.1)
Sodium: 135 mmol/L (ref 135–145)

## 2020-04-23 LAB — CBC
HCT: 23.1 % — ABNORMAL LOW (ref 36.0–46.0)
Hemoglobin: 7.4 g/dL — ABNORMAL LOW (ref 12.0–15.0)
MCH: 30.5 pg (ref 26.0–34.0)
MCHC: 32 g/dL (ref 30.0–36.0)
MCV: 95.1 fL (ref 80.0–100.0)
Platelets: 115 10*3/uL — ABNORMAL LOW (ref 150–400)
RBC: 2.43 MIL/uL — ABNORMAL LOW (ref 3.87–5.11)
RDW: 18.8 % — ABNORMAL HIGH (ref 11.5–15.5)
WBC: 18.9 10*3/uL — ABNORMAL HIGH (ref 4.0–10.5)
nRBC: 0 % (ref 0.0–0.2)

## 2020-04-23 LAB — GLUCOSE, CAPILLARY
Glucose-Capillary: 91 mg/dL (ref 70–99)
Glucose-Capillary: 136 mg/dL — ABNORMAL HIGH (ref 70–99)
Glucose-Capillary: 154 mg/dL — ABNORMAL HIGH (ref 70–99)
Glucose-Capillary: 192 mg/dL — ABNORMAL HIGH (ref 70–99)

## 2020-04-23 MED ORDER — ACD FORMULA A 0.73-2.45-2.2 GM/100ML VI SOLN: 1000.0000 mL | Status: DC

## 2020-04-23 MED ORDER — ANTICOAGULANT SODIUM CITRATE 4% (200MG/5ML) IV SOLN
5.0000 mL | Freq: Once | Status: DC
  Filled 2020-04-23: qty 5

## 2020-04-23 MED ORDER — CALCIUM GLUCONATE-NACL 2-0.675 GM/100ML-% IV SOLN
2.0000 g | Freq: Once | INTRAVENOUS | Status: AC
  Administered 2020-04-23: 2000 mg via INTRAVENOUS
  Filled 2020-04-23: qty 100

## 2020-04-23 MED ORDER — CALCIUM GLUCONATE-NACL 2-0.675 GM/100ML-% IV SOLN: 2.0000 g | Freq: Once | INTRAVENOUS | Status: DC

## 2020-04-23 MED ORDER — DIPHENHYDRAMINE HCL 25 MG PO CAPS
ORAL_CAPSULE | ORAL | Status: AC
  Administered 2020-04-23: 25 mg via ORAL
  Filled 2020-04-23: qty 1

## 2020-04-23 MED ORDER — ACD FORMULA A 0.73-2.45-2.2 GM/100ML VI SOLN
Status: AC
  Administered 2020-04-23: 1000 mL
  Filled 2020-04-23: qty 500

## 2020-04-23 MED ORDER — ACETAMINOPHEN 325 MG PO TABS: 650.0000 mg | ORAL_TABLET | ORAL | Status: DC | PRN

## 2020-04-23 MED ORDER — CALCIUM CARBONATE ANTACID 500 MG PO CHEW
2.0000 | CHEWABLE_TABLET | ORAL | Status: AC
  Administered 2020-04-23: 400 mg via ORAL

## 2020-04-23 MED ORDER — CALCIUM CARBONATE ANTACID 500 MG PO CHEW
CHEWABLE_TABLET | ORAL | Status: AC
  Administered 2020-04-23: 400 mg via ORAL
  Filled 2020-04-23: qty 4

## 2020-04-23 MED ORDER — DIPHENHYDRAMINE HCL 25 MG PO CAPS: 25.0000 mg | ORAL_CAPSULE | Freq: Four times a day (QID) | ORAL | Status: DC | PRN

## 2020-04-23 NOTE — Plan of Care (Signed)

## 2020-04-23 NOTE — Progress Notes (Signed)
Progress Note    Jasmine White  IWP:[id number] DOB: [birth year]  DOA: 04/16/2020 PCP: Chevis Pretty, FNP    Brief Narrative:     Medical records reviewed and are as summarized below:  Jasmine White is an 74 y.o. female with recent hospitalization from 6/26-7/20 for AKI due to ANCA positive RPGN now on HD via right PermCath MWF, HTN, hypothyroidism, HLD and osteoarthritis presenting with dyspnea, orthopnea and hemoptysis and admitted for acute respiratory failure with hypoxia likely due to pulmonary edema, possible pneumonia and vasculitis.  CXR concerning for multifocal pneumonia.  High-resolution CT chest with very extensive, dense and somewhat geographic heterogeneous and consolidative airspace disease throughout the lung with moderate right and small left pleural effusion.  She desaturated to 75% lying down for CT scan requiring NRB.  She received emergent HD with ultrafiltration of 3.6 L and improvement in her breathing.  She was eventually weaned to 5 L.  She was also started on broad-spectrum antibiotics for pneumonia and a steroid for possible pulmonary vasculitis.  PCCM and nephrology following and appreciate their help with this complicated patient.    Assessment/Plan:   Active Problems:   Essential hypertension, benign   Hypothyroidism   Chronic back pain   Hyponatremia   Acute renal failure (HCC)   Acute respiratory failure with hypoxia (HCC)   Hypokalemia   HAP (hospital-acquired pneumonia)   Acute pulmonary edema (HCC)   ANCA-associated vasculitis (HCC)   Acute respiratory failure with hypoxia -primarily due to pulmonary edema with possible concomitant pneumonia and vasculitis.  Has some leukocytosis which could be due to steroid.   - hemoptysis appears resolved-- will resume heparin - CXR concerning for multifocal pneumonia.  -High-resolution CT chest with very extensive, dense and somewhat geographic heterogeneous and consolidative airspace disease  throughout the lung with moderate right and small left pleural effusion.   -worsening O2 status and hemoptysis overnight 7/28-7/29- plan to do pulse steroids- IV x 3 days (7/29) then PO, ritux (first dose 7/29, next dose 8/12, and plasmapheresis (7/30, 7/31, 8/2, 8/4, 8/6, 8/9, 8/11)  Pulmonary edema with moderate right pleural effusion:  -continue HD for volume removal as she makes very little to no urine  Hospital-acquired pneumonia?  Patient has some leukocytosis but no fever.  Leukocytosis could be demargination from steroid.  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 -HD per nephrology -Solu-Medrol as above.  Anemia of renal disease: Hgb 8.1 (about baseline) -Continue monitoring -ESA and IV iron per nephrology  Uncontrolled hypertension  -Fluid management with dialysis -Continue home metoprolol with as needed hydralazine  Hypothyroidism -Continue home Synthroid  Secondary hyperparathyroidism -Per nephrology  Hyponatremia -Per nephrology  Chronic back pain/mood disorder -Continue home amitriptyline    Family Communication/Anticipated D/C date and plan/Code Status   DVT prophylaxis: resume heparin and monitor hgb and hemoptysis and thrombocytopenia Code Status: Full Code.  Disposition Plan: Status is: Inpatient  Remains inpatient appropriate because:IV treatments appropriate due to intensity of illness or inability to take PO   Dispo: The patient is from: Home              Anticipated d/c  is to:  Home              Anticipated d/c date is: > 3 days              Patient currently is not medically stable to d/c.         Medical Consultants:    Renal  PCCM    Subjective:   Still no hemoptysis  Objective:    Vitals:   04/23/20 0502 04/23/20 0557 04/23/20 0740 04/23/20 1042  BP:      Pulse:      Resp: 22     Temp:   98.1 F (36.7 C) 98.1 F (36.7 C)  TempSrc:   Oral Oral  SpO2:      Weight:  59.5 kg      Intake/Output Summary (Last 24 hours) at 04/23/2020 1141 Last data filed at 04/23/2020 0915 Gross per 24 hour  Intake 780 ml  Output --  Net 780 ml   Filed Weights   04/21/20 1053 04/21/20 1200 04/23/20 0557  Weight: 55.4 kg 55.4 kg 59.5 kg    Exam:  General: Appearance:    Well developed, well nourished female in no acute distress     Lungs:     Not on O2, respirations unlabored, lungs clear in upper lobes  Heart:    Normal heart rate. Normal rhythm. No murmurs, rubs, or gallops.   MS:   All extremities are intact.   Neurologic:   Awake, alert, oriented x 3. No apparent focal neurological           defect.       Data Reviewed:   I have personally reviewed following labs and imaging studies:  Labs: Labs show the following:   Basic Metabolic Panel: Recent Labs  Lab 04/19/20 0620 04/19/20 0620 04/19/20 0816 04/19/20 0816 04/20/20 0709 04/20/20 0709 04/21/20 0051 04/23/20 0113  NA 132*  --  133*  --  135  --  132* 135  K 4.3   < > 4.1   < > 4.4   < > 3.9 4.0  CL 95*  --  96*  --  97*  --  93* 95*  CO2 21*  --  23  --  22  --  22 25  GLUCOSE 128*  --  139*  --  179*  --  246* 119*  BUN 51*  --  50*  --  36*  --  55* 70*  CREATININE 5.51*  --  5.63*  --  3.67*  --  4.69* 5.01*  CALCIUM 8.7*  --  8.7*  --  8.4*  --  8.5* 8.4*  PHOS  --   --  5.6*  --   --   --   --   --    < > = values in this interval not displayed.   GFR Estimated Creatinine Clearance: 9.2 mL/min (A) (by C-G formula based on SCr of 5.01 mg/dL (H)). Liver Function  Tests: Recent Labs  Lab 04/19/20 0620 04/19/20 0816  AST 26  --   ALT 12  --   ALKPHOS 64  --   BILITOT 0.9  --   PROT 5.7*  --   ALBUMIN 2.6* 2.6*   No results for input(s): LIPASE, AMYLASE in the last 168 hours. No results for input(s): AMMONIA in the last 168 hours. Coagulation profile No results for input(s): INR, PROTIME in the last 168 hours.  CBC: Recent Labs  Lab 04/17/20 0140 04/19/20  3267 04/20/20 1710 04/21/20 0051 04/23/20 0113  WBC 14.9* 17.6* 18.6* 18.9* 18.9*  NEUTROABS  --  16.2*  --   --   --   HGB 8.1* 8.0* 7.8* 7.5* 7.4*  HCT 24.8* 24.8* 23.8* 23.3* 23.1*  MCV 95.4 95.0 93.7 94.3 95.1  PLT 164 169 131* 142* 115*   Cardiac Enzymes: No results for input(s): CKTOTAL, CKMB, CKMBINDEX, TROPONINI in the last 168 hours. BNP (last 3 results) No results for input(s): PROBNP in the last 8760 hours. CBG: Recent Labs  Lab 04/22/20 1306 04/22/20 1605 04/22/20 2111 04/23/20 0632 04/23/20 1049  GLUCAP 215* 174* 133* 91 136*   D-Dimer: No results for input(s): DDIMER in the last 72 hours. Hgb A1c: No results for input(s): HGBA1C in the last 72 hours. Lipid Profile: No results for input(s): CHOL, HDL, LDLCALC, TRIG, CHOLHDL, LDLDIRECT in the last 72 hours. Thyroid function studies: No results for input(s): TSH, T4TOTAL, T3FREE, THYROIDAB in the last 72 hours.  Invalid input(s): FREET3 Anemia work up: No results for input(s): VITAMINB12, FOLATE, FERRITIN, TIBC, IRON, RETICCTPCT in the last 72 hours. Sepsis Labs: Recent Labs  Lab 04/16/20 1644 04/17/20 0140 04/19/20 0620 04/20/20 1710 04/21/20 0051 04/23/20 0113  PROCALCITON 0.68  --   --   --   --   --   WBC  --    < > 17.6* 18.6* 18.9* 18.9*   < > = values in this interval not displayed.    Microbiology Recent Results (from the past 240 hour(s))  SARS Coronavirus 2 by RT PCR (hospital order, performed in [facility] hospital lab) Nasopharyngeal Nasopharyngeal Swab     Status: None    Collection Time: 04/16/20  9:11 AM   Specimen: Nasopharyngeal Swab  Result Value Ref Range Status   SARS Coronavirus 2 NEGATIVE NEGATIVE Final    Comment: (NOTE) SARS-CoV-2 target nucleic acids are NOT DETECTED.  The SARS-CoV-2 RNA is generally detectable in upper and lower respiratory specimens during the acute phase of infection. The lowest concentration of SARS-CoV-2 viral copies this assay can detect is 250 copies / mL. A negative result does not preclude SARS-CoV-2 infection and should not be used as the sole basis for treatment or other patient management decisions.  A negative result may occur with improper specimen collection / handling, submission of specimen other than nasopharyngeal swab, presence of viral mutation(s) within the areas targeted by this assay, and inadequate number of viral copies (<250 copies / mL). A negative result must be combined with clinical observations, patient history, and epidemiological information.  Fact Sheet for Patients:   StrictlyIdeas.no  Fact Sheet for Healthcare Providers: BankingDealers.co.za  This test is not yet approved or  cleared by the [place] FDA and has been authorized for detection and/or diagnosis of SARS-CoV-2 by FDA under an Emergency Use Authorization (EUA).  This EUA will remain in effect (meaning this test can be used) for the duration of the COVID-19 declaration under Section 564(b)(1) of the Act, 21 U.S.C. section 360bbb-3(b)(1), unless the authorization is terminated or revoked sooner.  Performed at [facility], [place] [address], [place], [place] [address]     Procedures and diagnostic studies:  No results found.  Medications:   . amitriptyline  50 mg Oral QHS  . calcitRIOL  0.25 mcg Oral Q M,W,F-HD  . Chlorhexidine Gluconate Cloth  6 each Topical Q0600  . cholecalciferol  1,000 Units Oral Daily  . darbepoetin (ARANESP) injection - DIALYSIS  100 mcg  Intravenous  Q Sat-HD  . heparin injection (subcutaneous)  5,000 Units Subcutaneous Q8H  . insulin aspart  0-6 Units Subcutaneous TID WC  . levothyroxine  88 mcg Oral QAC breakfast  . metoprolol succinate  100 mg Oral Daily  . multivitamin  1 tablet Oral QHS  . pantoprazole  40 mg Oral Daily  . potassium chloride  20 mEq Oral Once  . predniSONE  60 mg Oral Q breakfast  . sodium chloride flush  3 mL Intravenous Once  . sulfamethoxazole-trimethoprim  1 tablet Oral Once per day on Mon Wed Fri  . vancomycin  500 mg Intravenous Q T,Th,Sa-HD   Continuous Infusions: . anticoagulant sodium citrate    . ceFEPime (MAXIPIME) IV 1 g (04/22/20 2128)  . famotidine (PEPCID) IV    . sodium chloride       LOS: 7 days   Geradine Girt  [facility]   How to contact the [place] Attending or Consulting provider [place] or covering provider during after hours [place], for this patient?  1. Check the care team in [facility] and look for a) attending/consulting TR[facility] provider listed and b) the [facility] team listed 2. Log into [email or web address] and use [place]'s universal password to access. If you do not have the password, please contact the hospital operator. 3. Locate the [facility] provider you are looking for under [facility] and page to a number that you can be directly reached. 4. If you still have difficulty reaching the provider, please page the [facility] (Director on Call) for the Hospitalists listed on amion for assistance.  04/23/2020, 11:41 AM

## 2020-04-23 NOTE — Progress Notes (Signed)
NAME:  Jasmine White, MRN:  [id number], DOB:  [birth year], LOS: 7 ADMISSION DATE:  04/16/2020, CONSULTATION DATE:  04/16/20 REFERRING MD:  Langston Masker - EM, CHIEF COMPLAINT:  SOB + Hypoxia  Brief History   74 yo F w/ ANCA vasculitis on chronic immunosuppression presents to ED 7/26 with SOB, hypoxia. Oscillating O2 needs between 2L and NRB- for comfort in ED. By 8/2, has weaned down to room air.   History of present illness   74 yo F PMH ANCA vasculitis,renal failure on HD,  pulmonary vasculitis, HTN, HLD, prior COVID infection who presents to ED with SOB. Notably, patient recently admitted 6/26-7/20 with progressive renal failure, respiratory failure. Received plex, cytoxan, steroids, bactrim, requiring intubation 7/15 but successfully extubated 7/18.  She presents to ED with SOB which began the night prior to presentation. SOB was worse laying flat and improved when seated. Associated intermittent cough with colorless-to-tan sputum, with some blood tinged streaks. No missed HD sessions, due for HD day of ED presentation. No sick contacts, no fever chills, wheezing, no chest pain, no palpitations, no HA, dizziness.   In ED, became hypoxic when flat during CT chest, required NRB but has been down-tirtated to [facility]. PCCM and nephrology asked to evaluate in ED. ED labs: WBC 13.8, Na 131 K 3.3, ABG 7.4/ 39/ 114/ 26/ 0/ 24.9/ 99  Past Medical History  ANCA vasculitis Renal Failure on HD Pulmonary vasculitis  Prior COVID-19 infection  HTN HLD Hypothyroidism  Significant Hospital Events   7/26 Presents to ED with SOB + hypoxia   Consults:  PCCM Nephrology  Procedures:    Significant Diagnostic Tests:  7/26 HRCT> bilateral pleural effusions, R>L. Bilateral ASD, suspicious for PNA and pulmonary edema. Interlobular septal thickening.   Micro Data:  7/26 SARS Cov2> neg 7/26 RVP > not done 7/26 Sputum cx> > not done  Antimicrobials:  7/26 Vanc> 7/26 Cefepime>  7/26 cipro > 7/28 7/26  bactrim per pharm>   Interim history/subjective:  Feels markedly better over the weekend and today Continues on room air Reports that she is making a little urine, but doesn't know if it is "quality, filtered urine" No further hemoptysis   Objective   Blood pressure (!) 146/84, pulse 91, temperature 98.1 F (36.7 C), temperature source Oral, resp. rate 22, weight 59.5 kg, SpO2 95 %.        Intake/Output Summary (Last 24 hours) at 04/23/2020 0741 Last data filed at 04/22/2020 1700 Gross per 24 hour  Intake 780 ml  Output --  Net 780 ml   Filed Weights   04/21/20 1053 04/21/20 1200 04/23/20 0557  Weight: 55.4 kg 55.4 kg 59.5 kg    Examination: General: WDWN older adult F, reclined in bed NAD on RA  HENT: NCAT pink mmm anicteric sclera trachea midline  Lungs: CTA bilaterally. Even unlabored respirations on RA  CV: RRR s1s2 no rgm cap refill brisk  Abdomen: soft flat ndnt + bowel sounds  Extremities: Symmetrical bulk and tone, no cyanosis or clubbing, no edema Skin: c/d/w without rash Neuro: AAOx4 following commands PERRLA 5mm   Psych: euthymic mood congruent affect. Appropriate insight    Resolved Hospital Problem list   Hemoptysis  Acute hypoxic respiratory failure   Assessment & Plan:  Pulmonary renal syndrome Anti GBM disease ANCA, + MPO Ab -recurrent hemoptysis, improved -acute hypoxic respiratory failure 2/2 pulmonary edema, alveolitis,  DAH, improved  -received 1x rituximab 7/29 Prior history of COVID-19  P: Weaned off O2 Prednisone PLEX per nephrology,  for 7 treatments. Will undergo 8/2 Tussinex  First dose ritiximab 7/19; next dose 8/12 Ultimately will need repeat CT chest after tx for acute exacerbation is complete, follow CXR PRN in interim   ANCA vasculitis/Anti-GBM disease:  -was on cytoxan, held this admission -received 1x rituximab 7/29 ESRD on HD MWF -scant UOP, low likelihood for renal recovery  P Continue prednisone 60mg  qD Next dose  rituximab 8/12 HD for volume removal TTS  PLEX per nephro     Eliseo Gum MSN, AGACNP-BC [place] [phone] If no answer, [phone] 04/23/2020, 7:45 AM

## 2020-04-23 NOTE — Progress Notes (Signed)
Patient ID: Jasmine White, female   DOB: 09-25-1945, 74 y.o.   MRN: [id number] S: Feeling better today O:BP (!) 146/84   Pulse 91   Temp 98.1 F (36.7 C) (Oral)   Resp 22   Wt 59.5 kg   SpO2 95%   BMI 21.17 kg/m   Intake/Output Summary (Last 24 hours) at 04/23/2020 0919 Last data filed at 04/22/2020 1700 Gross per 24 hour  Intake 560 ml  Output --  Net 560 ml   Intake/Output: I/O last 3 completed shifts: In: 1020 [P.O.:1020] Out: -   Intake/Output this shift:  No intake/output data recorded. Weight change: 4.1 kg Gen: NAD CVS: RRR, no rub Resp: bibasilar crackles Abd: +BS, soft, nt/nd Ext: no edema, LUE AVF +T/B  Recent Labs  Lab 04/16/20 1048 04/17/20 0140 04/19/20 0620 04/19/20 0816 04/20/20 0709 04/21/20 0051 04/23/20 0113  NA 131* 131* 132* 133* 135 132* 135  K 3.4* 4.4 4.3 4.1 4.4 3.9 4.0  CL  --  95* 95* 96* 97* 93* 95*  CO2  --  22 21* 23 22 22 25   GLUCOSE  --  181* 128* 139* 179* 246* 119*  BUN  --  34* 51* 50* 36* 55* 70*  CREATININE  --  4.35* 5.51* 5.63* 3.67* 4.69* 5.01*  ALBUMIN  --   --  2.6* 2.6*  --   --   --   CALCIUM  --  7.5* 8.7* 8.7* 8.4* 8.5* 8.4*  PHOS  --   --   --  5.6*  --   --   --   AST  --   --  26  --   --   --   --   ALT  --   --  12  --   --   --   --    Liver Function Tests: Recent Labs  Lab 04/19/20 0620 04/19/20 0816  AST 26  --   ALT 12  --   ALKPHOS 64  --   BILITOT 0.9  --   PROT 5.7*  --   ALBUMIN 2.6* 2.6*   No results for input(s): LIPASE, AMYLASE in the last 168 hours. No results for input(s): AMMONIA in the last 168 hours. CBC: Recent Labs  Lab 04/17/20 0140 04/17/20 0140 04/19/20 0620 04/19/20 0620 04/20/20 1710 04/21/20 0051 04/23/20 0113  WBC 14.9*   < > 17.6*   < > 18.6* 18.9* 18.9*  NEUTROABS  --   --  16.2*  --   --   --   --   HGB 8.1*   < > 8.0*   < > 7.8* 7.5* 7.4*  HCT 24.8*   < > 24.8*   < > 23.8* 23.3* 23.1*  MCV 95.4  --  95.0  --  93.7 94.3 95.1  PLT 164   < > 169   < > 131* 142*  115*   < > = values in this interval not displayed.   Cardiac Enzymes: No results for input(s): CKTOTAL, CKMB, CKMBINDEX, TROPONINI in the last 168 hours. CBG: Recent Labs  Lab 04/20/20 2017 04/22/20 1306 04/22/20 1605 04/22/20 2111 04/23/20 0632  GLUCAP 218* 215* 174* 133* 91    Iron Studies: No results for input(s): IRON, TIBC, TRANSFERRIN, FERRITIN in the last 72 hours. Studies/Results: No results found. Marland Kitchen amitriptyline  50 mg Oral QHS  . calcitRIOL  0.25 mcg Oral Q M,W,F-HD  . Chlorhexidine Gluconate Cloth  6 each Topical Q0600  . cholecalciferol  1,000 Units Oral Daily  . darbepoetin (ARANESP) injection - DIALYSIS  100 mcg Intravenous Q Sat-HD  . heparin injection (subcutaneous)  5,000 Units Subcutaneous Q8H  . insulin aspart  0-6 Units Subcutaneous TID WC  . levothyroxine  88 mcg Oral QAC breakfast  . metoprolol succinate  100 mg Oral Daily  . multivitamin  1 tablet Oral QHS  . pantoprazole  40 mg Oral Daily  . potassium chloride  20 mEq Oral Once  . predniSONE  60 mg Oral Q breakfast  . sodium chloride flush  3 mL Intravenous Once  . sulfamethoxazole-trimethoprim  1 tablet Oral Once per day on Mon Wed Fri  . vancomycin  500 mg Intravenous Q T,Th,Sa-HD    BMET    Component Value Date/Time   NA 135 04/23/2020 0113   NA 128 (L) 03/01/2020 0927   K 4.0 04/23/2020 0113   CL 95 (L) 04/23/2020 0113   CO2 25 04/23/2020 0113   GLUCOSE 119 (H) 04/23/2020 0113   BUN 70 (H) 04/23/2020 0113   BUN 67 (H) 03/01/2020 0927   CREATININE 5.01 (H) 04/23/2020 0113   CREATININE 0.84 12/21/2012 1552   CALCIUM 8.4 (L) 04/23/2020 0113   GFRNONAA 8 (L) 04/23/2020 0113   GFRNONAA 72 12/21/2012 1552   GFRAA 9 (L) 04/23/2020 0113   GFRAA 83 12/21/2012 1552   CBC    Component Value Date/Time   WBC 18.9 (H) 04/23/2020 0113   RBC 2.43 (L) 04/23/2020 0113   HGB 7.4 (L) 04/23/2020 0113   HGB 9.7 (L) 03/01/2020 0927   HCT 23.1 (L) 04/23/2020 0113   HCT 28.8 (L) 03/01/2020 0927    PLT 115 (L) 04/23/2020 0113   PLT 397 03/01/2020 0927   MCV 95.1 04/23/2020 0113   MCV 79 03/01/2020 0927   MCH 30.5 04/23/2020 0113   MCHC 32.0 04/23/2020 0113   RDW 18.8 (H) 04/23/2020 0113   RDW 14.7 03/01/2020 0927   LYMPHSABS 0.6 (L) 04/19/2020 0620   LYMPHSABS 1.3 03/01/2020 0927   MONOABS 0.5 04/19/2020 0620   EOSABS 0.0 04/19/2020 0620   EOSABS 0.2 03/01/2020 0927   BASOSABS 0.0 04/19/2020 0620   BASOSABS 0.1 03/01/2020 0927   Dialysis Orders: MWF - [facility] 3.5hrs 400/800 2K 2.25Ca EDW 61.5kg (will need to lower as we are challenging her edw due to hypoxia) TDC LU AVF maturing   Hep 1800 unit qHD Mircera 126mcg Q2wks - last 7/23 Venofer 100mg  qHD x10 - not started Calcitriol 0.52mcg qHD  Assessment/Plan:  1. 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  Restarted on plasmapheresis and HD with UF with improvement.  PCCM following and holding off on repeat bronch 2. Anti-GBM/RPGN- restarted pulse solumedrol and now on prednisone 60 mg daily.  Stopped oral cytoxan and given rituxan on 04/19/20.  Due for second dose on 05/03/20.  Resumed plasmapheresis and is s/p 1/7 treatments.  Plan for daily pheresis this week.   3. Dialysis dependent AKI- remains anuric.  Biopsy with significant crescents and dense ATN.  Will need to get back on MWF HD schedule later this week. 4. Anemia due to [facility] and CKD- on aranesp.  No heparin with HD  5. CKD-MBD- stable 6. HTN- stable  [name], MD [employer] [phone]

## 2020-04-24 LAB — RENAL FUNCTION PANEL
Albumin: 2.8 g/dL — ABNORMAL LOW (ref 3.5–5.0)
Anion gap: 18 — ABNORMAL HIGH (ref 5–15)
BUN: 103 mg/dL — ABNORMAL HIGH (ref 8–23)
CO2: 25 mmol/L (ref 22–32)
Calcium: 7.8 mg/dL — ABNORMAL LOW (ref 8.9–10.3)
Chloride: 88 mmol/L — ABNORMAL LOW (ref 98–111)
Creatinine, Ser: 6.63 mg/dL — ABNORMAL HIGH (ref 0.44–1.00)
GFR calc Af Amer: 7 mL/min — ABNORMAL LOW (ref >60)
GFR calc non Af Amer: 6 mL/min — ABNORMAL LOW (ref >60)
Glucose, Bld: 214 mg/dL — ABNORMAL HIGH (ref 70–99)
Phosphorus: 8.5 mg/dL — ABNORMAL HIGH (ref 2.5–4.6)
Potassium: 4.2 mmol/L (ref 3.5–5.1)
Sodium: 131 mmol/L — ABNORMAL LOW (ref 135–145)

## 2020-04-24 LAB — THERAPEUTIC PLASMA EXCHANGE (BLOOD BANK)
Plasma Exchange: 3000
Plasma volume needed: 3000
Unit division: 0
Unit division: 0
Unit division: 0
Unit division: 0
Unit division: 0
Unit division: 0
Unit division: 0
Unit division: 0
Unit division: 0

## 2020-04-24 LAB — CBC
HCT: 23 % — ABNORMAL LOW (ref 36.0–46.0)
Hemoglobin: 7.3 g/dL — ABNORMAL LOW (ref 12.0–15.0)
MCH: 30.5 pg (ref 26.0–34.0)
MCHC: 31.7 g/dL (ref 30.0–36.0)
MCV: 96.2 fL (ref 80.0–100.0)
Platelets: 126 10*3/uL — ABNORMAL LOW (ref 150–400)
RBC: 2.39 MIL/uL — ABNORMAL LOW (ref 3.87–5.11)
RDW: 18.8 % — ABNORMAL HIGH (ref 11.5–15.5)
WBC: 12.4 10*3/uL — ABNORMAL HIGH (ref 4.0–10.5)
nRBC: 0 % (ref 0.0–0.2)

## 2020-04-24 LAB — GLUCOSE, CAPILLARY
Glucose-Capillary: 83 mg/dL (ref 70–99)
Glucose-Capillary: 83 mg/dL (ref 70–99)
Glucose-Capillary: 161 mg/dL — ABNORMAL HIGH (ref 70–99)
Glucose-Capillary: 175 mg/dL — ABNORMAL HIGH (ref 70–99)

## 2020-04-24 MED ORDER — ACD FORMULA A 0.73-2.45-2.2 GM/100ML VI SOLN
1000.0000 mL | Status: DC
  Administered 2020-04-24: 1000 mL

## 2020-04-24 MED ORDER — ANTICOAGULANT SODIUM CITRATE 4% (200MG/5ML) IV SOLN
5.0000 mL | Freq: Once | Status: DC
  Filled 2020-04-24: qty 5

## 2020-04-24 MED ORDER — ACD FORMULA A 0.73-2.45-2.2 GM/100ML VI SOLN
Status: AC
  Filled 2020-04-24: qty 500

## 2020-04-24 MED ORDER — CALCIUM GLUCONATE-NACL 2-0.675 GM/100ML-% IV SOLN
2.0000 g | Freq: Once | INTRAVENOUS | Status: AC
  Administered 2020-04-24: 2000 mg via INTRAVENOUS
  Filled 2020-04-24: qty 100

## 2020-04-24 MED ORDER — DIPHENHYDRAMINE HCL 25 MG PO CAPS
ORAL_CAPSULE | ORAL | Status: AC
  Filled 2020-04-24: qty 1

## 2020-04-24 MED ORDER — DIPHENHYDRAMINE HCL 25 MG PO CAPS
25.0000 mg | ORAL_CAPSULE | Freq: Four times a day (QID) | ORAL | Status: DC | PRN
  Administered 2020-04-24: 25 mg via ORAL

## 2020-04-24 MED ORDER — ACETAMINOPHEN 325 MG PO TABS
ORAL_TABLET | ORAL | Status: AC
  Filled 2020-04-24: qty 2

## 2020-04-24 MED ORDER — CALCIUM CARBONATE ANTACID 500 MG PO CHEW
CHEWABLE_TABLET | ORAL | Status: AC
  Filled 2020-04-24: qty 2

## 2020-04-24 MED ORDER — ACETAMINOPHEN 325 MG PO TABS
650.0000 mg | ORAL_TABLET | ORAL | Status: DC | PRN
  Administered 2020-04-24: 650 mg via ORAL

## 2020-04-24 MED ORDER — NYSTATIN 100000 UNIT/ML MT SUSP
5.0000 mL | Freq: Four times a day (QID) | OROMUCOSAL | Status: DC
  Administered 2020-04-24 – 2020-05-03 (×26): 500000 [IU] via ORAL
  Filled 2020-04-24 (×25): qty 5

## 2020-04-24 MED ORDER — CALCIUM CARBONATE ANTACID 500 MG PO CHEW
2.0000 | CHEWABLE_TABLET | ORAL | Status: DC
  Administered 2020-04-24: 400 mg via ORAL

## 2020-04-24 NOTE — Progress Notes (Signed)
Patient ID: Jasmine White, female   DOB: 1946/03/31, 74 y.o.   MRN: [id number] S:Feels much better today.  O:BP (!) 143/70 (BP Location: Right Arm)   Pulse 86   Temp 97.7 F (36.5 C) (Oral)   Resp 20   Wt 59.5 kg   SpO2 97%   BMI 21.17 kg/m   Intake/Output Summary (Last 24 hours) at 04/24/2020 0914 Last data filed at 04/23/2020 1300 Gross per 24 hour  Intake 460 ml  Output --  Net 460 ml   Intake/Output: I/O last 3 completed shifts: In: 460 [P.O.:460] Out: -   Intake/Output this shift:  No intake/output data recorded. Weight change:  Gen:NAD CVS: RRR, no rub Resp: bibasilar crackles Abd: +BS, soft, NT/ND Ext: no edema, LUE AVF +T/B  Recent Labs  Lab 04/19/20 0620 04/19/20 0816 04/20/20 0709 04/21/20 0051 04/23/20 0113 04/23/20 1557  NA 132* 133* 135 132* 135 130*  K 4.3 4.1 4.4 3.9 4.0 4.4  CL 95* 96* 97* 93* 95* 91*  CO2 21* 23 22 22 25   --   GLUCOSE 128* 139* 179* 246* 119* 142*  BUN 51* 50* 36* 55* 70* 87*  CREATININE 5.51* 5.63* 3.67* 4.69* 5.01* 6.20*  ALBUMIN 2.6* 2.6*  --   --   --   --   CALCIUM 8.7* 8.7* 8.4* 8.5* 8.4*  --   PHOS  --  5.6*  --   --   --   --   AST 26  --   --   --   --   --   ALT 12  --   --   --   --   --    Liver Function Tests: Recent Labs  Lab 04/19/20 0620 04/19/20 0816  AST 26  --   ALT 12  --   ALKPHOS 64  --   BILITOT 0.9  --   PROT 5.7*  --   ALBUMIN 2.6* 2.6*   No results for input(s): LIPASE, AMYLASE in the last 168 hours. No results for input(s): AMMONIA in the last 168 hours. CBC: Recent Labs  Lab 04/19/20 0620 04/19/20 0620 04/20/20 1710 04/20/20 1710 04/21/20 0051 04/23/20 0113 04/23/20 1557  WBC 17.6*   < > 18.6*  --  18.9* 18.9*  --   NEUTROABS 16.2*  --   --   --   --   --   --   HGB 8.0*   < > 7.8*   < > 7.5* 7.4* 16.7*  HCT 24.8*   < > 23.8*   < > 23.3* 23.1* 49.0*  MCV 95.0  --  93.7  --  94.3 95.1  --   PLT 169   < > 131*  --  142* 115*  --    < > = values in this interval not displayed.    Cardiac Enzymes: No results for input(s): CKTOTAL, CKMB, CKMBINDEX, TROPONINI in the last 168 hours. CBG: Recent Labs  Lab 04/23/20 0632 04/23/20 1049 04/23/20 1842 04/23/20 2144 04/24/20 0640  GLUCAP 91 136* 154* 192* 83    Iron Studies: No results for input(s): IRON, TIBC, TRANSFERRIN, FERRITIN in the last 72 hours. Studies/Results: No results found. Marland Kitchen amitriptyline  50 mg Oral QHS  . calcitRIOL  0.25 mcg Oral Q M,W,F-HD  . Chlorhexidine Gluconate Cloth  6 each Topical Q0600  . cholecalciferol  1,000 Units Oral Daily  . darbepoetin (ARANESP) injection - DIALYSIS  100 mcg Intravenous Q Sat-HD  . heparin injection (subcutaneous)  5,000 Units Subcutaneous Q8H  . insulin aspart  0-6 Units Subcutaneous TID WC  . levothyroxine  88 mcg Oral QAC breakfast  . metoprolol succinate  100 mg Oral Daily  . multivitamin  1 tablet Oral QHS  . nystatin  5 mL Oral QID  . pantoprazole  40 mg Oral Daily  . potassium chloride  20 mEq Oral Once  . predniSONE  60 mg Oral Q breakfast  . sodium chloride flush  3 mL Intravenous Once  . sulfamethoxazole-trimethoprim  1 tablet Oral Once per day on Mon Wed Fri    BMET    Component Value Date/Time   NA 130 (L) 04/23/2020 1557   NA 128 (L) 03/01/2020 0927   K 4.4 04/23/2020 1557   CL 91 (L) 04/23/2020 1557   CO2 25 04/23/2020 0113   GLUCOSE 142 (H) 04/23/2020 1557   BUN 87 (H) 04/23/2020 1557   BUN 67 (H) 03/01/2020 0927   CREATININE 6.20 (H) 04/23/2020 1557   CREATININE 0.84 12/21/2012 1552   CALCIUM 8.4 (L) 04/23/2020 0113   GFRNONAA 8 (L) 04/23/2020 0113   GFRNONAA 72 12/21/2012 1552   GFRAA 9 (L) 04/23/2020 0113   GFRAA 83 12/21/2012 1552   CBC    Component Value Date/Time   WBC 18.9 (H) 04/23/2020 0113   RBC 2.43 (L) 04/23/2020 0113   HGB 16.7 (H) 04/23/2020 1557   HGB 9.7 (L) 03/01/2020 0927   HCT 49.0 (H) 04/23/2020 1557   HCT 28.8 (L) 03/01/2020 0927   PLT 115 (L) 04/23/2020 0113   PLT 397 03/01/2020 0927   MCV 95.1  04/23/2020 0113   MCV 79 03/01/2020 0927   MCH 30.5 04/23/2020 0113   MCHC 32.0 04/23/2020 0113   RDW 18.8 (H) 04/23/2020 0113   RDW 14.7 03/01/2020 0927   LYMPHSABS 0.6 (L) 04/19/2020 0620   LYMPHSABS 1.3 03/01/2020 0927   MONOABS 0.5 04/19/2020 0620   EOSABS 0.0 04/19/2020 0620   EOSABS 0.2 03/01/2020 0927   BASOSABS 0.0 04/19/2020 0620   BASOSABS 0.1 03/01/2020 0927     Dialysis Orders: MWF - [place] 3.5hrs 400/800 2K 2.25Ca EDW 61.5kg (will need to lower as we are challenging her edw due to hypoxia) TDC LU AVF maturing   Hep 1800 unit qHD Mircera 147mcg Q2wks - last 7/23 Venofer 100mg  qHD x10 - not started Calcitriol 0.55mcg qHD  Assessment/Plan:  1. 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  Restarted on plasmapheresis and HD with UF with improvement.  PCCM following and holding off on repeat bronch 2. Anti-GBM/RPGN- restarted pulse solumedrol and now on prednisone 60 mg daily.  Stopped oral cytoxan and given rituxan on 04/19/20.  Due for second dose on 05/03/20.  Resumed plasmapheresis and is s/p 2/7 treatments.  Plan for daily pheresis this week.   3. Dialysis dependent AKI- remains anuric.  Biopsy with significant crescents and dense ATN.  Will need to get back on MWF HD schedule later this week or by Monday next. 4. Anemia due to [facility] and CKD- on aranesp.  No heparin with HD  5. CKD-MBD- stable 6. HTN- stable  [name], MD [employer] [phone]

## 2020-04-24 NOTE — Plan of Care (Signed)

## 2020-04-24 NOTE — Progress Notes (Signed)
Progress Note    Jasmine White  POE:[id number] DOB: Jan 25, 1946  DOA: 04/16/2020 PCP: Chevis Pretty, FNP    Brief Narrative:     Medical records reviewed and are as summarized below:  Jasmine White is an 74 y.o. female with recent hospitalization from 6/26-7/20 for AKI due to ANCA positive RPGN now on HD via right PermCath MWF, HTN, hypothyroidism, HLD and osteoarthritis presenting with dyspnea, orthopnea and hemoptysis and admitted for acute respiratory failure with hypoxia likely due to pulmonary edema, possible pneumonia and vasculitis.  Appears more related to vasculitis than PNA.  PCCM and nephrology following and appreciate their help with this complicated patient.    Assessment/Plan:   Active Problems:   Essential hypertension, benign   Hypothyroidism   Chronic back pain   Hyponatremia   Acute renal failure (HCC)   Acute respiratory failure with hypoxia (HCC)   Hypokalemia   HAP (hospital-acquired pneumonia)   Acute pulmonary edema (HCC)   ANCA-associated vasculitis (HCC)   Acute respiratory failure with hypoxia -primarily due to pulmonary edema with possible concomitant pneumonia and vasculitis.  Has some leukocytosis which could be due to steroid.   - hemoptysis appears resolved-- will resume heparin SQ - CXR concerning for multifocal pneumonia- has finished abx course -High-resolution CT chest with very extensive, dense and somewhat geographic heterogeneous and consolidative airspace disease throughout the lung with moderate right and small left pleural effusion.   -worsening O2 status and hemoptysis overnight on 7/28 but since has improved markedly with pulse steroids- IV x 3 days (7/29) then PO, ritux (first dose 7/29, next dose 8/12, and plasmapheresis (7/30, 7/31, 8/2, 8/4, 8/6, 8/9, 8/11)  Thrush -nystatin PO  Pulmonary edema with moderate right pleural effusion:  -continue HD for volume removal as she makes very little to no  urine  Hospital-acquired pneumonia?  Patient has some leukocytosis but no fever.  Leukocytosis could be demargination from steroid.  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 -HD per nephrology -Solu-Medrol as above.  Anemia of renal disease: Hgb 8.1 (about baseline) -Continue monitoring -ESA and IV iron per nephrology  Uncontrolled hypertension  -Fluid management with dialysis -Continue home metoprolol with as needed hydralazine  Hypothyroidism -Continue home Synthroid  Secondary hyperparathyroidism -Per nephrology  Hyponatremia -Per nephrology  Chronic back pain/mood disorder -Continue home amitriptyline    Family Communication/Anticipated D/C date and plan/Code Status   DVT prophylaxis: resume heparin and monitor hgb and hemoptysis and thrombocytopenia Code Status: Full Code.  Disposition Plan: Status is: Inpatient  Remains inpatient appropriate because:IV treatments appropriate due to intensity of illness or inability to take PO   Dispo: The patient is from: Home              Anticipated d/c is to: Home              Anticipated d/c date is: > 3 days              Patient currently is not medically stable to d/c.       Medical Consultants:    Renal  PCCM    Subjective:   C/o  mouth soreness  Objective:    Vitals:   04/23/20 1939 04/23/20 2353 04/24/20 0352 04/24/20 0731  BP: (!) 156/82 (!) 144/71 (!) 145/78 (!) 143/70  Pulse: 84 86 86   Resp: 20 20 20 20   Temp: 97.8 F (36.6 C)  (!) 97.5 F (36.4 C) 97.7 F (36.5 C) 97.7 F (36.5 C)  TempSrc: Oral Oral Oral Oral  SpO2: 100% 100% 100% 97%  Weight:        Intake/Output Summary (Last 24 hours) at 04/24/2020 0940 Last data filed at 04/23/2020 1300 Gross per 24 hour  Intake 240 ml  Output --  Net 240 ml   Filed Weights   04/21/20 1053 04/21/20 1200 04/23/20 0557  Weight: 55.4 kg 55.4 kg 59.5 kg    Exam:   General: Appearance:    Well developed, well nourished female in no acute distress   Some white plaques on tongue  Lungs:      respirations unlabored  Heart:    Normal heart rate. Normal rhythm. No murmurs, rubs, or gallops.   MS:   All extremities are intact.   Neurologic:   Awake, alert, oriented x 3. No apparent focal neurological           defect.      Data Reviewed:   I have personally reviewed following labs and imaging studies:  Labs: Labs show the following:   Basic Metabolic Panel: Recent Labs  Lab 04/19/20 0620 04/19/20 0620 04/19/20 0816 04/19/20 0816 04/20/20 0709 04/20/20 0709 04/21/20 0051 04/21/20 0051 04/23/20 0113 04/23/20 1557  NA 132*   < > 133*  --  135  --  132*  --  135 130*  K 4.3   < > 4.1   < > 4.4   < > 3.9   < > 4.0 4.4  CL 95*   < > 96*  --  97*  --  93*  --  95* 91*  CO2 21*  --  23  --  22  --  22  --  25  --   GLUCOSE 128*   < > 139*  --  179*  --  246*  --  119* 142*  BUN 51*   < > 50*  --  36*  --  55*  --  70* 87*  CREATININE 5.51*   < > 5.63*  --  3.67*  --  4.69*  --  5.01* 6.20*  CALCIUM 8.7*  --  8.7*  --  8.4*  --  8.5*  --  8.4*  --   PHOS  --   --  5.6*  --   --   --   --   --   --   --    < > = values in this interval not displayed.   GFR Estimated Creatinine Clearance: 7.5 mL/min (A) (by C-G formula based on SCr of 6.2 mg/dL (H)). Liver Function Tests: Recent Labs  Lab 04/19/20 0620 04/19/20 0816  AST 26  --   ALT 12  --   ALKPHOS 64  --   BILITOT 0.9  --   PROT 5.7*  --   ALBUMIN 2.6* 2.6*   No results for input(s): LIPASE,  AMYLASE in the last 168 hours. No results for input(s): AMMONIA in the last 168 hours. Coagulation profile No results for input(s): INR, PROTIME in the last 168 hours.  CBC: Recent Labs  Lab 04/19/20 0620 04/20/20 1710 04/21/20 0051 04/23/20 0113 04/23/20 1557  WBC 17.6* 18.6* 18.9* 18.9*  --   NEUTROABS 16.2*  --   --   --   --   HGB 8.0* 7.8* 7.5* 7.4* 16.7*  HCT 24.8* 23.8* 23.3* 23.1* 49.0*  MCV 95.0 93.7 94.3 95.1  --   PLT 169 131* 142* 115*  --    Cardiac Enzymes: No results for input(s): CKTOTAL, CKMB, CKMBINDEX, TROPONINI in the last 168 hours. BNP (last 3 results) No results for input(s): PROBNP in the last 8760 hours. CBG: Recent Labs  Lab 04/23/20 0632 04/23/20 1049 04/23/20 1842 04/23/20 2144 04/24/20 0640  GLUCAP 91 136* 154* 192* 83   D-Dimer: No results for input(s): DDIMER in the last 72 hours. Hgb A1c: No results for input(s): HGBA1C in the last 72 hours. Lipid Profile: No results for input(s): CHOL, HDL, LDLCALC, TRIG, CHOLHDL, LDLDIRECT in the last 72 hours. Thyroid function studies: No results for input(s): TSH, T4TOTAL, T3FREE, THYROIDAB in the last 72 hours.  Invalid input(s): FREET3 Anemia work up: No results for input(s): VITAMINB12, FOLATE, FERRITIN, TIBC, IRON, RETICCTPCT in the last 72 hours. Sepsis Labs: Recent Labs  Lab 04/19/20 0620 04/20/20 1710 04/21/20 0051 04/23/20 0113  WBC 17.6* 18.6* 18.9* 18.9*    Microbiology Recent Results (from the past 240 hour(s))  SARS Coronavirus 2 by RT PCR (hospital order, performed in [facility] hospital lab) Nasopharyngeal Nasopharyngeal Swab     Status: None   Collection Time: 04/16/20  9:11 AM   Specimen: Nasopharyngeal Swab  Result Value Ref Range Status   SARS Coronavirus 2 NEGATIVE NEGATIVE Final    Comment: (NOTE) SARS-CoV-2 target nucleic acids are NOT DETECTED.  The SARS-CoV-2 RNA is generally detectable in upper and lower respiratory specimens during the acute phase of  infection. The lowest concentration of SARS-CoV-2 viral copies this assay can detect is 250 copies / mL. A negative result does not preclude SARS-CoV-2 infection and should not be used as the sole basis for treatment or other patient management decisions.  A negative result may occur with improper specimen collection / handling, submission of specimen other than nasopharyngeal swab, presence of viral mutation(s) within the areas targeted by this assay, and inadequate number of viral copies (<250 copies / mL). A negative result must be combined with clinical observations, patient history, and epidemiological information.  Fact Sheet for Patients:   StrictlyIdeas.no  Fact Sheet for Healthcare Providers: BankingDealers.co.za  This test is not yet approved or  cleared by the [place] FDA and has been authorized for detection and/or diagnosis of SARS-CoV-2 by FDA under an Emergency Use Authorization (EUA).  This EUA will remain in effect (meaning this test can be used) for the duration of the COVID-19 declaration under Section 564(b)(1) of the Act, 21 U.S.C. section 360bbb-3(b)(1), unless the authorization is terminated or revoked sooner.  Performed at [facility], [place] [address], [place], [place] [address]     Procedures and diagnostic studies:  No results found.  Medications:   . amitriptyline  50 mg Oral QHS  . calcitRIOL  0.25 mcg Oral Q M,W,F-HD  . Chlorhexidine Gluconate Cloth  6 each Topical Q0600  . cholecalciferol  1,000 Units Oral Daily  . darbepoetin (ARANESP) injection - DIALYSIS  100 mcg Intravenous Q Sat-HD  . heparin injection (subcutaneous)  5,000 Units Subcutaneous Q8H  . insulin aspart  0-6 Units Subcutaneous TID WC  . levothyroxine  88 mcg Oral QAC breakfast  . metoprolol succinate  100 mg Oral Daily  . multivitamin  1 tablet Oral QHS  . nystatin  5 mL Oral QID  . pantoprazole  40 mg Oral Daily  .  potassium chloride  20 mEq Oral Once  . predniSONE  60 mg Oral Q breakfast  . sodium chloride flush  3 mL Intravenous Once  . sulfamethoxazole-trimethoprim  1 tablet Oral Once per day on Mon Wed Fri   Continuous Infusions: . anticoagulant sodium citrate    . famotidine (PEPCID) IV    . sodium chloride       LOS: 8 days   Geradine Girt  [facility]   How to contact the [facility] Attending or Consulting provider [place] or covering provider during after hours [place], for this patient?  1. Check the care team in [facility] and look for a) attending/consulting [facility] provider listed and b) the [facility] team listed 2. Log into [email or web address] and use [place] universal password to access. If you do not have the password, please contact the hospital operator. 3. Locate the [facility] provider you are looking for under [facility] and page to a number that you can be directly reached. 4. If you still have difficulty reaching the provider, please page the [facility] (Director on Call) for the Hospitalists listed on amion for assistance.  04/24/2020, 9:40 AM

## 2020-04-25 LAB — THERAPEUTIC PLASMA EXCHANGE (BLOOD BANK)
Plasma Exchange: 3012
Plasma volume needed: 3000
Unit division: 0
Unit division: 0
Unit division: 0
Unit division: 0
Unit division: 0
Unit division: 0
Unit division: 0
Unit division: 0
Unit division: 0

## 2020-04-25 LAB — GLUCOSE, CAPILLARY
Glucose-Capillary: 92 mg/dL (ref 70–99)
Glucose-Capillary: 162 mg/dL — ABNORMAL HIGH (ref 70–99)
Glucose-Capillary: 179 mg/dL — ABNORMAL HIGH (ref 70–99)
Glucose-Capillary: 119 mg/dL — ABNORMAL HIGH (ref 70–99)

## 2020-04-25 LAB — RENAL FUNCTION PANEL
Albumin: 2.9 g/dL — ABNORMAL LOW (ref 3.5–5.0)
Anion gap: 13 (ref 5–15)
BUN: 32 mg/dL — ABNORMAL HIGH (ref 8–23)
CO2: 32 mmol/L (ref 22–32)
Calcium: 8.4 mg/dL — ABNORMAL LOW (ref 8.9–10.3)
Chloride: 94 mmol/L — ABNORMAL LOW (ref 98–111)
Creatinine, Ser: 3.07 mg/dL — ABNORMAL HIGH (ref 0.44–1.00)
GFR calc Af Amer: 17 mL/min — ABNORMAL LOW (ref >60)
GFR calc non Af Amer: 14 mL/min — ABNORMAL LOW (ref >60)
Glucose, Bld: 95 mg/dL (ref 70–99)
Phosphorus: 4.8 mg/dL — ABNORMAL HIGH (ref 2.5–4.6)
Potassium: 3 mmol/L — ABNORMAL LOW (ref 3.5–5.1)
Sodium: 139 mmol/L (ref 135–145)

## 2020-04-25 LAB — CBC
HCT: 22.3 % — ABNORMAL LOW (ref 36.0–46.0)
Hemoglobin: 7.1 g/dL — ABNORMAL LOW (ref 12.0–15.0)
MCH: 30.2 pg (ref 26.0–34.0)
MCHC: 31.8 g/dL (ref 30.0–36.0)
MCV: 94.9 fL (ref 80.0–100.0)
Platelets: 113 10*3/uL — ABNORMAL LOW (ref 150–400)
RBC: 2.35 MIL/uL — ABNORMAL LOW (ref 3.87–5.11)
RDW: 18.8 % — ABNORMAL HIGH (ref 11.5–15.5)
WBC: 10.3 10*3/uL (ref 4.0–10.5)
nRBC: 0 % (ref 0.0–0.2)

## 2020-04-25 MED ORDER — POTASSIUM CHLORIDE CRYS ER 20 MEQ PO TBCR
30.0000 meq | EXTENDED_RELEASE_TABLET | ORAL | Status: AC
  Administered 2020-04-25 (×2): 30 meq via ORAL
  Filled 2020-04-25 (×2): qty 1

## 2020-04-25 NOTE — Plan of Care (Signed)

## 2020-04-25 NOTE — Progress Notes (Signed)
Patient ID: Jasmine White, female   DOB: 1946-03-16, 74 y.o.   MRN: [id number] S: No events overnight O:BP 135/64 (BP Location: Right Arm)   Pulse 94   Temp 97.6 F (36.4 C) (Oral)   Resp 19   Wt 58.5 kg   SpO2 99%   BMI 20.82 kg/m   Intake/Output Summary (Last 24 hours) at 04/25/2020 0954 Last data filed at 04/25/2020 0800 Gross per 24 hour  Intake 220 ml  Output --  Net 220 ml   Intake/Output: No intake/output data recorded.  Intake/Output this shift:  Total I/O In: 220 [P.O.:220] Out: -  Weight change:  Gen: NAD CVS: RRR Resp: bibasilar crackles Abd: benign Ext: no edema, LUE AVF +T/B  Recent Labs  Lab 04/19/20 0620 04/19/20 0620 04/19/20 0816 04/20/20 0709 04/21/20 0051 04/23/20 0113 04/23/20 1557 04/24/20 1303 04/25/20 0046  NA 132*   < > 133* 135 132* 135 130* 131* 139  K 4.3   < > 4.1 4.4 3.9 4.0 4.4 4.2 3.0*  CL 95*   < > 96* 97* 93* 95* 91* 88* 94*  CO2 21*  --  23 22 22 25   --  25 32  GLUCOSE 128*   < > 139* 179* 246* 119* 142* 214* 95  BUN 51*   < > 50* 36* 55* 70* 87* 103* 32*  CREATININE 5.51*   < > 5.63* 3.67* 4.69* 5.01* 6.20* 6.63* 3.07*  ALBUMIN 2.6*  --  2.6*  --   --   --   --  2.8* 2.9*  CALCIUM 8.7*  --  8.7* 8.4* 8.5* 8.4*  --  7.8* 8.4*  PHOS  --   --  5.6*  --   --   --   --  8.5* 4.8*  AST 26  --   --   --   --   --   --   --   --   ALT 12  --   --   --   --   --   --   --   --    < > = values in this interval not displayed.   Liver Function Tests: Recent Labs  Lab 04/19/20 0620 04/19/20 0620 04/19/20 0816 04/24/20 1303 04/25/20 0046  AST 26  --   --   --   --   ALT 12  --   --   --   --   ALKPHOS 64  --   --   --   --   BILITOT 0.9  --   --   --   --   PROT 5.7*  --   --   --   --   ALBUMIN 2.6*   < > 2.6* 2.8* 2.9*   < > = values in this interval not displayed.   No results for input(s): LIPASE, AMYLASE in the last 168 hours. No results for input(s): AMMONIA in the last 168 hours. CBC: Recent Labs  Lab 04/19/20 0620  04/19/20 0620 04/20/20 1710 04/20/20 1710 04/21/20 0051 04/21/20 0051 04/23/20 0113 04/23/20 0113 04/23/20 1557 04/24/20 1302 04/25/20 0046  WBC 17.6*   < > 18.6*   < > 18.9*   < > 18.9*  --   --  12.4* 10.3  NEUTROABS 16.2*  --   --   --   --   --   --   --   --   --   --   HGB 8.0*   < >  7.8*   < > 7.5*   < > 7.4*   < > 16.7* 7.3* 7.1*  HCT 24.8*   < > 23.8*   < > 23.3*   < > 23.1*   < > 49.0* 23.0* 22.3*  MCV 95.0   < > 93.7  --  94.3  --  95.1  --   --  96.2 94.9  PLT 169   < > 131*   < > 142*   < > 115*  --   --  126* 113*   < > = values in this interval not displayed.   Cardiac Enzymes: No results for input(s): CKTOTAL, CKMB, CKMBINDEX, TROPONINI in the last 168 hours. CBG: Recent Labs  Lab 04/23/20 2144 04/24/20 0640 04/24/20 1100 04/24/20 2059 04/25/20 0629  GLUCAP 192* 83  83 161* 175* 92    Iron Studies: No results for input(s): IRON, TIBC, TRANSFERRIN, FERRITIN in the last 72 hours. Studies/Results: No results found. Marland Kitchen amitriptyline  50 mg Oral QHS  . calcitRIOL  0.25 mcg Oral Q M,W,F-HD  . Chlorhexidine Gluconate Cloth  6 each Topical Q0600  . cholecalciferol  1,000 Units Oral Daily  . darbepoetin (ARANESP) injection - DIALYSIS  100 mcg Intravenous Q Sat-HD  . heparin injection (subcutaneous)  5,000 Units Subcutaneous Q8H  . insulin aspart  0-6 Units Subcutaneous TID WC  . levothyroxine  88 mcg Oral QAC breakfast  . metoprolol succinate  100 mg Oral Daily  . multivitamin  1 tablet Oral QHS  . nystatin  5 mL Oral QID  . pantoprazole  40 mg Oral Daily  . potassium chloride  30 mEq Oral Q3H  . potassium chloride  20 mEq Oral Once  . predniSONE  60 mg Oral Q breakfast  . sodium chloride flush  3 mL Intravenous Once  . sulfamethoxazole-trimethoprim  1 tablet Oral Once per day on Mon Wed Fri    BMET    Component Value Date/Time   NA 139 04/25/2020 0046   NA 128 (L) 03/01/2020 0927   K 3.0 (L) 04/25/2020 0046   CL 94 (L) 04/25/2020 0046   CO2 32  04/25/2020 0046   GLUCOSE 95 04/25/2020 0046   BUN 32 (H) 04/25/2020 0046   BUN 67 (H) 03/01/2020 0927   CREATININE 3.07 (H) 04/25/2020 0046   CREATININE 0.84 12/21/2012 1552   CALCIUM 8.4 (L) 04/25/2020 0046   GFRNONAA 14 (L) 04/25/2020 0046   GFRNONAA 72 12/21/2012 1552   GFRAA 17 (L) 04/25/2020 0046   GFRAA 83 12/21/2012 1552   CBC    Component Value Date/Time   WBC 10.3 04/25/2020 0046   RBC 2.35 (L) 04/25/2020 0046   HGB 7.1 (L) 04/25/2020 0046   HGB 9.7 (L) 03/01/2020 0927   HCT 22.3 (L) 04/25/2020 0046   HCT 28.8 (L) 03/01/2020 0927   PLT 113 (L) 04/25/2020 0046   PLT 397 03/01/2020 0927   MCV 94.9 04/25/2020 0046   MCV 79 03/01/2020 0927   MCH 30.2 04/25/2020 0046   MCHC 31.8 04/25/2020 0046   RDW 18.8 (H) 04/25/2020 0046   RDW 14.7 03/01/2020 0927   LYMPHSABS 0.6 (L) 04/19/2020 0620   LYMPHSABS 1.3 03/01/2020 0927   MONOABS 0.5 04/19/2020 0620   EOSABS 0.0 04/19/2020 0620   EOSABS 0.2 03/01/2020 0927   BASOSABS 0.0 04/19/2020 0620   BASOSABS 0.1 03/01/2020 0927    Dialysis Orders: MWF - [place] 3.5hrs 400/800 2K 2.25Ca EDW 61.5kg(will need to lower as we are  challenging her edw due to hypoxia) TDC LU AVF maturing   Hep 1800 unit qHD Mircera 131mcg Q2wks - last 7/23 Venofer 100mg  qHD x10 - not started Calcitriol 0.8mcg qHD  Assessment/Plan:  1. 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 Restarted on plasmapheresis and HD with UF with improvement. PCCM following and holding off on repeat bronch 2. Anti-GBM/RPGN- restarted pulse solumedrol and now on prednisone 60 mg daily. Stopped oral cytoxan and given rituxan on 04/19/20. Due for second dose on 05/03/20. Resumed plasmapheresis and is s/p 4/7 treatments. Plan for pheresis on 8/5, 8/7, and 8/9 to complete 7 treatments.  3. Dialysis dependent AKI- remains anuric. Biopsy with significant crescents and dense ATN. Will need to get back on MWF  HD schedule later this week or by Monday next. 4. Anemia due to [facility] and CKD- on aranesp. No heparin with HD  5. CKD-MBD- stable 6. HTN- stable  [name], MD [employer] [phone]

## 2020-04-25 NOTE — Progress Notes (Signed)
PROGRESS NOTE    Jasmine White  VHQ:[id number] DOB: [birth year] DOA: 04/16/2020 PCP: Chevis Pretty, FNP    Brief Narrative:  Jasmine White is an 74 y.o. female with recent hospitalization from 6/26-7/20 for AKI due to ANCA positive RPGN now on HD via right PermCath MWF, HTN, hypothyroidism, HLD and osteoarthritis presenting with dyspnea, orthopnea and hemoptysis and admitted for acute respiratory failure with hypoxia likely due to pulmonary edema, possible pneumonia and vasculitis. Appears more related to vasculitis than PNA. PCCM and nephrology following and appreciate their help with this complicated patient.    Assessment & Plan:   Active Problems:   Essential hypertension, benign   Hypothyroidism   Chronic back pain   Hyponatremia   Acute renal failure (HCC)   Acute respiratory failure with hypoxia (HCC)   Hypokalemia   HAP (hospital-acquired pneumonia)   Acute pulmonary edema (HCC)   ANCA-associated vasculitis (HCC)   Acute respiratory failure with hypoxia: resolved Patient represented to the ED with progressive shortness of breath and hemoptysis.  Recently started on dialysis for ANCA vasculitis with concern for now pulmonary involvement.  Was noted to be hypoxic with SPO2 75% requiring NRB.  Test x-ray notable for multifocal pneumonia with CT chest extensive, dense heterogeneous and consolidative airspace disease bilaterally worsened in comparison with previous exam on 04/05/2020.  Covid-19 PCR negative.  Etiology likely multifactorial with pulmonary edema and pulmonary vasculitis most likely especially with hemoptysis, less likely infectious/pneumonia.  Initially was started on IV ciprofloxacin, cefepime and vancomycin which was eventually discontinued as vasculitis leading etiology. --Continue steroids, plasmapheresis and hemodialysis as below --Oxygen now titrated off with SPO2 94% on room air.  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  Received Rituxan on 04/19/2020, next dose 05/03/2020. --Prednisone 60 mg p.o. 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 --Continue HD per nephrology  Hypokalemia K 3.0 today, will replete. --repeat electrolytes in a.m. to include magnesium  Anemia of chronic disease Hemoglobin 7.1 today. --ESA and IV iron per nephrology --Continue to closely monitor hemoglobin, transfuse for hemoglobin less than 7.0.  Essential hypertension --Continue home metoprolol succinate 100 mg p.o. daily --Hydralazine as needed --Volume management per HD  Hypothyroidism --Continue levothyroxine 53mcg PO daily  Chronic back pain/mood disorder --Continue amitriptyline 50 mg p.o. nightly    DVT prophylaxis: Heparin Code Status: Full code Family Communication: None present at bedside    Disposition Plan:  Status is: Inpatient  Remains inpatient appropriate because:Persistent severe electrolyte disturbances, Ongoing diagnostic testing needed not appropriate for outpatient work up, Unsafe d/c plan and IV treatments appropriate due to intensity of illness or inability to take PO   Dispo: The patient is from: Home              Anticipated d/c is to: Home              Anticipated d/c date is: > 3 days              Patient currently is not medically stable to d/c.  Consultants:   Nephrology  Pulmonology/critical care medicine - signed off 04/24/2020  Procedures:   none  Antimicrobials:   Bactrim - continues for PCP prophylaxis  Ciprofloxacin 7/26 -7/28  Cefepime 7/26 - 8/2  Vancomycin 7/26 - 8/2    Subjective: Patient seen and examined bedside,  resting comfortably.  Sitting in bedside chair.  No complaints.  Denies headache, fever/chills/night sweats, no nausea/vomiting/diarrhea, no chest pain, palpitations, shortness of breath, no abdominal pain, no weakness.  No further hemoptysis.  Oxygenating well on room air.  No acute events overnight per nursing staff.  Objective: Vitals:   04/25/20 0000 04/25/20 0304 04/25/20 0803 04/25/20 1125  BP: (!) 156/77 134/63 135/64 (!) 150/71  Pulse: 86 82 94 91  Resp: 20 13 19 15   Temp: 98.5 F (36.9 C) 98.4 F (36.9 C) 97.6 F (36.4 C) 98 F (36.7 C)  TempSrc: Oral Oral Oral Oral  SpO2: 93% 94% 99% 98%  Weight:        Intake/Output Summary (Last 24 hours) at 04/25/2020 1331 Last data filed at 04/25/2020 0800 Gross per 24 hour  Intake 220 ml  Output --  Net 220 ml   Filed Weights   04/24/20 1251 04/24/20 1710 04/24/20 1930  Weight: 60 kg 58 kg 58.5 kg    Examination:  General exam: Appears calm and comfortable  Respiratory system: Clear to auscultation. Respiratory effort normal. Cardiovascular system: S1 & S2 heard, RRR. No JVD, murmurs, rubs, gallops or clicks. No pedal edema.  Right permacath HD noted right chest. Gastrointestinal system: Abdomen is nondistended, soft and nontender. No organomegaly or masses felt. Normal bowel sounds heard. Central nervous system: Alert and oriented. No focal neurological deficits. Extremities: Symmetric 5 x 5 power. Skin: No rashes, lesions or ulcers Psychiatry: Judgement and insight appear normal. Mood & affect appropriate.     Data Reviewed: I have personally reviewed following labs and imaging studies  CBC: Recent Labs  Lab 04/19/20 0620 04/19/20 0620 04/20/20 1710 04/20/20 1710 04/21/20 0051 04/23/20 0113 04/23/20 1557 04/24/20 1302 04/25/20 0046  WBC 17.6*   < > 18.6*  --  18.9* 18.9*  --  12.4* 10.3  NEUTROABS 16.2*  --   --   --   --   --   --   --   --   HGB 8.0*   < > 7.8*   < > 7.5* 7.4* 16.7* 7.3* 7.1*  HCT 24.8*   < >  23.8*   < > 23.3* 23.1* 49.0* 23.0* 22.3*  MCV 95.0   < > 93.7  --  94.3 95.1  --  96.2 94.9  PLT 169   < > 131*  --  142* 115*  --  126* 113*   < > = values in this interval not displayed.   Basic Metabolic Panel: Recent Labs  Lab 04/19/20 0816 04/19/20 0816 04/20/20 0709 04/20/20 0709 04/21/20 0051 04/23/20 0113 04/23/20 1557 04/24/20 1303 04/25/20 0046  NA 133*   < > 135   < > 132* 135 130* 131* 139  K 4.1   < > 4.4   < > 3.9 4.0 4.4 4.2 3.0*  CL 96*   < > 97*   < > 93* 95* 91* 88* 94*  CO2 23   < > 22  --  22 25  --  25 32  GLUCOSE 139*   < > 179*   < > 246* 119* 142* 214* 95  BUN 50*   < > 36*   < > 55* 70* 87* 103* 32*  CREATININE 5.63*   < > 3.67*   < > 4.69* 5.01* 6.20* 6.63* 3.07*  CALCIUM 8.7*   < >  8.4*  --  8.5* 8.4*  --  7.8* 8.4*  PHOS 5.6*  --   --   --   --   --   --  8.5* 4.8*   < > = values in this interval not displayed.   GFR: Estimated Creatinine Clearance: 14.8 mL/min (A) (by C-G formula based on SCr of 3.07 mg/dL (H)). Liver Function Tests: Recent Labs  Lab 04/19/20 0620 04/19/20 0816 04/24/20 1303 04/25/20 0046  AST 26  --   --   --   ALT 12  --   --   --   ALKPHOS 64  --   --   --   BILITOT 0.9  --   --   --   PROT 5.7*  --   --   --   ALBUMIN 2.6* 2.6* 2.8* 2.9*   No results for input(s): LIPASE, AMYLASE in the last 168 hours. No results for input(s): AMMONIA in the last 168 hours. Coagulation Profile: No results for input(s): INR, PROTIME in the last 168 hours. Cardiac Enzymes: No results for input(s): CKTOTAL, CKMB, CKMBINDEX, TROPONINI in the last 168 hours. BNP (last 3 results) No results for input(s): PROBNP in the last 8760 hours. HbA1C: No results for input(s): HGBA1C in the last 72 hours. CBG: Recent Labs  Lab 04/24/20 0640 04/24/20 1100 04/24/20 2059 04/25/20 0629 04/25/20 1124  GLUCAP 83  83 161* 175* 92 162*   Lipid Profile: No results for input(s): CHOL, HDL, LDLCALC, TRIG, CHOLHDL, LDLDIRECT in the last 72  hours. Thyroid Function Tests: No results for input(s): TSH, T4TOTAL, FREET4, T3FREE, THYROIDAB in the last 72 hours. Anemia Panel: No results for input(s): VITAMINB12, FOLATE, FERRITIN, TIBC, IRON, RETICCTPCT in the last 72 hours. Sepsis Labs: No results for input(s): PROCALCITON, LATICACIDVEN in the last 168 hours.  Recent Results (from the past 240 hour(s))  SARS Coronavirus 2 by RT PCR (hospital order, performed in [facility] hospital lab) Nasopharyngeal Nasopharyngeal Swab     Status: None   Collection Time: 04/16/20  9:11 AM   Specimen: Nasopharyngeal Swab  Result Value Ref Range Status   SARS Coronavirus 2 NEGATIVE NEGATIVE Final    Comment: (NOTE) SARS-CoV-2 target nucleic acids are NOT DETECTED.  The SARS-CoV-2 RNA is generally detectable in upper and lower respiratory specimens during the acute phase of infection. The lowest concentration of SARS-CoV-2 viral copies this assay can detect is 250 copies / mL. A negative result does not preclude SARS-CoV-2 infection and should not be used as the sole basis for treatment or other patient management decisions.  A negative result may occur with improper specimen collection / handling, submission of specimen other than nasopharyngeal swab, presence of viral mutation(s) within the areas targeted by this assay, and inadequate number of viral copies (<250 copies / mL). A negative result must be combined with clinical observations, patient history, and epidemiological information.  Fact Sheet for Patients:   StrictlyIdeas.no  Fact Sheet for Healthcare Providers: BankingDealers.co.za  This test is not yet approved or  cleared by the [place] FDA and has been authorized for detection and/or diagnosis of SARS-CoV-2 by FDA under an Emergency Use Authorization (EUA).  This EUA will remain in effect (meaning this test can be used) for the duration of the COVID-19 declaration under  Section 564(b)(1) of the Act, 21 U.S.C. section 360bbb-3(b)(1), unless the authorization is terminated or revoked sooner.  Performed at [facility], [place] [address] [place], [place] [address]  Radiology Studies: No results found.      Scheduled Meds: . amitriptyline  50 mg Oral QHS  . calcitRIOL  0.25 mcg Oral Q M,W,F-HD  . Chlorhexidine Gluconate Cloth  6 each Topical Q0600  . cholecalciferol  1,000 Units Oral Daily  . darbepoetin (ARANESP) injection - DIALYSIS  100 mcg Intravenous Q Sat-HD  . heparin injection (subcutaneous)  5,000 Units Subcutaneous Q8H  . insulin aspart  0-6 Units Subcutaneous TID WC  . levothyroxine  88 mcg Oral QAC breakfast  . metoprolol succinate  100 mg Oral Daily  . multivitamin  1 tablet Oral QHS  . nystatin  5 mL Oral QID  . pantoprazole  40 mg Oral Daily  . potassium chloride  20 mEq Oral Once  . predniSONE  60 mg Oral Q breakfast  . sodium chloride flush  3 mL Intravenous Once  . sulfamethoxazole-trimethoprim  1 tablet Oral Once per day on Mon Wed Fri   Continuous Infusions: . anticoagulant sodium citrate    . famotidine (PEPCID) IV    . sodium chloride       LOS: 9 days    Time spent: 41 minutes spent on chart review, discussion with nursing staff, consultants, updating family and interview/physical exam; more than 50% of that time was spent in counseling and/or coordination of care.    [place] Berkey J [place] DO [facility] Available via Epic secure chat 7am-7pm After these hours, please refer to coverage provider listed on [email or web address] 04/25/2020, 1:31 PM

## 2020-04-26 LAB — BASIC METABOLIC PANEL
Anion gap: 14 (ref 5–15)
BUN: 52 mg/dL — ABNORMAL HIGH (ref 8–23)
CO2: 29 mmol/L (ref 22–32)
Calcium: 8.2 mg/dL — ABNORMAL LOW (ref 8.9–10.3)
Chloride: 95 mmol/L — ABNORMAL LOW (ref 98–111)
Creatinine, Ser: 5.22 mg/dL — ABNORMAL HIGH (ref 0.44–1.00)
GFR calc Af Amer: 9 mL/min — ABNORMAL LOW (ref >60)
GFR calc non Af Amer: 8 mL/min — ABNORMAL LOW (ref >60)
Glucose, Bld: 114 mg/dL — ABNORMAL HIGH (ref 70–99)
Potassium: 4.3 mmol/L (ref 3.5–5.1)
Sodium: 138 mmol/L (ref 135–145)

## 2020-04-26 LAB — GLUCOSE, CAPILLARY
Glucose-Capillary: 108 mg/dL — ABNORMAL HIGH (ref 70–99)
Glucose-Capillary: 124 mg/dL — ABNORMAL HIGH (ref 70–99)
Glucose-Capillary: 178 mg/dL — ABNORMAL HIGH (ref 70–99)
Glucose-Capillary: 160 mg/dL — ABNORMAL HIGH (ref 70–99)

## 2020-04-26 LAB — CBC
HCT: 23.5 % — ABNORMAL LOW (ref 36.0–46.0)
Hemoglobin: 7.6 g/dL — ABNORMAL LOW (ref 12.0–15.0)
MCH: 31.7 pg (ref 26.0–34.0)
MCHC: 32.3 g/dL (ref 30.0–36.0)
MCV: 97.9 fL (ref 80.0–100.0)
Platelets: 156 10*3/uL (ref 150–400)
RBC: 2.4 MIL/uL — ABNORMAL LOW (ref 3.87–5.11)
RDW: 19.8 % — ABNORMAL HIGH (ref 11.5–15.5)
WBC: 13 10*3/uL — ABNORMAL HIGH (ref 4.0–10.5)
nRBC: 0 % (ref 0.0–0.2)

## 2020-04-26 LAB — MAGNESIUM: Magnesium: 2.1 mg/dL (ref 1.7–2.4)

## 2020-04-26 MED ORDER — ANTICOAGULANT SODIUM CITRATE 4% (200MG/5ML) IV SOLN
5.0000 mL | Status: AC
  Filled 2020-04-26: qty 5

## 2020-04-26 NOTE — Plan of Care (Signed)

## 2020-04-26 NOTE — Progress Notes (Signed)
Patient ID: Jasmine White, female   DOB: 1946-04-19, 74 y.o.   MRN: [id number] S: Feeling better O:BP 140/68 (BP Location: Right Arm)   Pulse 95   Temp (!) 97.5 F (36.4 C) (Oral)   Resp 18   Wt 58.5 kg   SpO2 100%   BMI 20.82 kg/m   Intake/Output Summary (Last 24 hours) at 04/26/2020 0903 Last data filed at 04/26/2020 0756 Gross per 24 hour  Intake 460 ml  Output --  Net 460 ml   Intake/Output: I/O last 3 completed shifts: In: 440 [P.O.:440] Out: -   Intake/Output this shift:  Total I/O In: 240 [P.O.:240] Out: -  Weight change:  Gen: NAD CVS: no rub Resp: bibasilar crackles Abd: benign Ext: trace edema, lue avf +T/B  Recent Labs  Lab 04/20/20 0709 04/21/20 0051 04/23/20 0113 04/23/20 1557 04/24/20 1303 04/25/20 0046 04/26/20 0333  NA 135 132* 135 130* 131* 139 138  K 4.4 3.9 4.0 4.4 4.2 3.0* 4.3  CL 97* 93* 95* 91* 88* 94* 95*  CO2 22 22 25   --  25 32 29  GLUCOSE 179* 246* 119* 142* 214* 95 114*  BUN 36* 55* 70* 87* 103* 32* 52*  CREATININE 3.67* 4.69* 5.01* 6.20* 6.63* 3.07* 5.22*  ALBUMIN  --   --   --   --  2.8* 2.9*  --   CALCIUM 8.4* 8.5* 8.4*  --  7.8* 8.4* 8.2*  PHOS  --   --   --   --  8.5* 4.8*  --    Liver Function Tests: Recent Labs  Lab 04/24/20 1303 04/25/20 0046  ALBUMIN 2.8* 2.9*   No results for input(s): LIPASE, AMYLASE in the last 168 hours. No results for input(s): AMMONIA in the last 168 hours. CBC: Recent Labs  Lab 04/21/20 0051 04/21/20 0051 04/23/20 0113 04/23/20 1557 04/24/20 1302 04/25/20 0046 04/26/20 0333  WBC 18.9*   < > 18.9*  --  12.4* 10.3 13.0*  HGB 7.5*   < > 7.4*   < > 7.3* 7.1* 7.6*  HCT 23.3*   < > 23.1*   < > 23.0* 22.3* 23.5*  MCV 94.3  --  95.1  --  96.2 94.9 97.9  PLT 142*   < > 115*  --  126* 113* 156   < > = values in this interval not displayed.   Cardiac Enzymes: No results for input(s): CKTOTAL, CKMB, CKMBINDEX, TROPONINI in the last 168 hours. CBG: Recent Labs  Lab 04/25/20 0629  04/25/20 1124 04/25/20 1559 04/25/20 2124 04/26/20 0616  GLUCAP 92 162* 179* 119* 108*    Iron Studies: No results for input(s): IRON, TIBC, TRANSFERRIN, FERRITIN in the last 72 hours. Studies/Results: No results found. Marland Kitchen amitriptyline  50 mg Oral QHS  . calcitRIOL  0.25 mcg Oral Q M,W,F-HD  . Chlorhexidine Gluconate Cloth  6 each Topical Q0600  . cholecalciferol  1,000 Units Oral Daily  . darbepoetin (ARANESP) injection - DIALYSIS  100 mcg Intravenous Q Sat-HD  . heparin injection (subcutaneous)  5,000 Units Subcutaneous Q8H  . insulin aspart  0-6 Units Subcutaneous TID WC  . levothyroxine  88 mcg Oral QAC breakfast  . metoprolol succinate  100 mg Oral Daily  . multivitamin  1 tablet Oral QHS  . nystatin  5 mL Oral QID  . pantoprazole  40 mg Oral Daily  . potassium chloride  20 mEq Oral Once  . predniSONE  60 mg Oral Q breakfast  . sodium chloride  flush  3 mL Intravenous Once  . sulfamethoxazole-trimethoprim  1 tablet Oral Once per day on Mon Wed Fri    BMET    Component Value Date/Time   NA 138 04/26/2020 0333   NA 128 (L) 03/01/2020 0927   K 4.3 04/26/2020 0333   CL 95 (L) 04/26/2020 0333   CO2 29 04/26/2020 0333   GLUCOSE 114 (H) 04/26/2020 0333   BUN 52 (H) 04/26/2020 0333   BUN 67 (H) 03/01/2020 0927   CREATININE 5.22 (H) 04/26/2020 0333   CREATININE 0.84 12/21/2012 1552   CALCIUM 8.2 (L) 04/26/2020 0333   GFRNONAA 8 (L) 04/26/2020 0333   GFRNONAA 72 12/21/2012 1552   GFRAA 9 (L) 04/26/2020 0333   GFRAA 83 12/21/2012 1552   CBC    Component Value Date/Time   WBC 13.0 (H) 04/26/2020 0333   RBC 2.40 (L) 04/26/2020 0333   HGB 7.6 (L) 04/26/2020 0333   HGB 9.7 (L) 03/01/2020 0927   HCT 23.5 (L) 04/26/2020 0333   HCT 28.8 (L) 03/01/2020 0927   PLT 156 04/26/2020 0333   PLT 397 03/01/2020 0927   MCV 97.9 04/26/2020 0333   MCV 79 03/01/2020 0927   MCH 31.7 04/26/2020 0333   MCHC 32.3 04/26/2020 0333   RDW 19.8 (H) 04/26/2020 0333   RDW 14.7 03/01/2020  0927   LYMPHSABS 0.6 (L) 04/19/2020 0620   LYMPHSABS 1.3 03/01/2020 0927   MONOABS 0.5 04/19/2020 0620   EOSABS 0.0 04/19/2020 0620   EOSABS 0.2 03/01/2020 0927   BASOSABS 0.0 04/19/2020 0620   BASOSABS 0.1 03/01/2020 0927    Dialysis Orders: MWF - [facility] 3.5hrs 400/800 2K 2.25Ca EDW 61.5kg(will need to lower as we are challenging her edw due to hypoxia) TDC LU AVF maturing   Hep 1800 unit qHD Mircera 167mcg Q2wks - last 7/23 Venofer 100mg  qHD x10 - not started Calcitriol 0.51mcg qHD  Assessment/Plan:  1. 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 Restarted on plasmapheresis and HD with UF with improvement. PCCM following and holding off on repeat bronch 1. Continue to challenge edw as tolerated.  More than 3 kg below edw prior to hd today. 2. Anti-GBM/RPGN- restarted pulse solumedrol and now on prednisone 60 mg daily. Stopped oral cytoxan and given rituxan on 04/19/20. Due for second dose on 05/03/20.  1. Resumed plasmapheresis and is s/p4/7 treatments.  2. Plan for pheresis on 8/6, 8/8, and 8/10 to complete 7 treatments.  3. Plan for HD today and 8/7 and get back on schedule 04/30/20 3. Dialysis dependent AKI- remains anuric. Biopsy with significant crescents and dense ATN. Will need to get back on MWF HD schedule later this weekor by Monday next. 4. Anemia due to [facility] and CKD- on aranesp. No heparin with HD  5. CKD-MBD- stable 6. HTN- stable  [name], MD [employer] [phone]

## 2020-04-26 NOTE — Evaluation (Signed)
Physical Therapy Evaluation Patient Details Name: Jasmine White MRN: [id number] DOB: 09-06-1946 Today's Date: 04/26/2020   History of Present Illness  74 y.o. female with recent hospitalization from 6/26-7/20 for AKI due to ANCA positive RPGN now on HD via right PermCath MWF, HTN, hypothyroidism, HLD and osteoarthritis presenting with dyspnea, orthopnea and hemoptysis and admitted for acute respiratory failure with hypoxia likely due to pulmonary edema, possible pneumonia and vasculitis.   Clinical Impression  Pt presents to PT with deficits in gait, balance, and endurance compared to her pre-covid baseline. Pt was independent without the use of an assistive device, ambulating for 5-7 miles at a time, biking, and hiking prior to COVID infection last November. Recently pt ambulating with use of Rollator due to fatigue and imbalance. Pt mobilizes well with UE support of RW this session but does begin to fatigue at the end of session. Pt will benefit from continued acute PT POC to continue progressing upon the pt's activity tolerance and hopefully returning to ambulating without an assistive device. PT recommends HHPT at the time of discharge.    Follow Up Recommendations Home health PT;Supervision - Intermittent    Equipment Recommendations  None recommended by PT    Recommendations for Other Services       Precautions / Restrictions Precautions Precautions: Fall Restrictions Weight Bearing Restrictions: No      Mobility  Bed Mobility               General bed mobility comments: pt received and left in recliner  Transfers Overall transfer level: Modified independent Equipment used: Rolling walker (2 wheeled) Transfers: Sit to/from Stand Sit to Stand: Modified independent (Device/Increase time)            Ambulation/Gait Ambulation/Gait assistance: Supervision Gait Distance (Feet): 400 Feet Assistive device: Rolling walker (2 wheeled) Gait Pattern/deviations:  WFL(Within Functional Limits) Gait velocity: functional Gait velocity interpretation: 1.31 - 2.62 ft/sec, indicative of limited community ambulator General Gait Details: steady step through gait, slight increase in trunk flexion likely due to a need for increased RW height  Stairs            Wheelchair Mobility    Modified Rankin (Stroke Patients Only)       Balance Overall balance assessment: Mild deficits observed, not formally tested                                           Pertinent Vitals/Pain Pain Assessment: No/denies pain    Home Living Family/patient expects to be discharged to:: Private residence Living Arrangements: Spouse/significant other Available Help at Discharge: Family;Other (Comment) (70 y.o. grandson) Type of Home: House Home Access: Ramped entrance     Home Layout: Two level;Able to live on main level with bedroom/bathroom Home Equipment: Grab bars - tub/shower;Shower seat - built in;Walker - 2 wheels;Wheelchair - [employer] - 4 wheels      Prior Function Level of Independence: Independent with assistive device(s)         Comments: ambulating with Rollator since recent admission     Hand Dominance   Dominant Hand: Right    Extremity/Trunk Assessment   Upper Extremity Assessment Upper Extremity Assessment: Overall WFL for tasks assessed    Lower Extremity Assessment Lower Extremity Assessment: Generalized weakness    Cervical / Trunk Assessment Cervical / Trunk Assessment: Normal  Communication   Communication: No difficulties  Cognition Arousal/Alertness:  Awake/alert Behavior During Therapy: WFL for tasks assessed/performed Overall Cognitive Status: Within Functional Limits for tasks assessed                                        General Comments General comments (skin integrity, edema, etc.): pt with intermittent readings of tachycardia into 130-150s, PT believes these to be artifact  as unreliable rhythm on monitor. SpO2 stable during mobility    Exercises     Assessment/Plan    PT Assessment Patient needs continued PT services  PT Problem List Decreased activity tolerance;Decreased mobility;Decreased balance       PT Treatment Interventions Gait training;Stair training;Balance training;Patient/family education    PT Goals (Current goals can be found in the Care Plan section)  Acute Rehab PT Goals Patient Stated Goal: to go home and stay home PT Goal Formulation: With patient Time For Goal Achievement: 05/10/20 Potential to Achieve Goals: Good Additional Goals Additional Goal #1: Pt will score >45/56 on BERG balance test to indicated a low falls risk    Frequency Min 3X/week   Barriers to discharge        Co-evaluation               AM-PAC PT "6 Clicks" Mobility  Outcome Measure Help needed turning from your back to your side while in a flat bed without using bedrails?: None Help needed moving from lying on your back to sitting on the side of a flat bed without using bedrails?: None Help needed moving to and from a bed to a chair (including a wheelchair)?: None Help needed standing up from a chair using your arms (e.g., wheelchair or bedside chair)?: None Help needed to walk in hospital room?: None Help needed climbing 3-5 steps with a railing? : A Little 6 Click Score: 23    End of Session   Activity Tolerance: Patient tolerated treatment well Patient left: in chair;with call bell/phone within reach Nurse Communication: Mobility status PT Visit Diagnosis: Other abnormalities of gait and mobility (R26.89)    Time: 8916-9450 PT Time Calculation (min) (ACUTE ONLY): 14 min   Charges:   PT Evaluation $PT Eval Moderate Complexity: 1 Mod          Zenaida Niece, PT, DPT Acute Rehabilitation Pager: [phone]   Zenaida Niece 04/26/2020, 5:42 PM

## 2020-04-26 NOTE — Progress Notes (Signed)
PROGRESS NOTE    Jasmine White  PIR:[id number] DOB: 09-26-1945 DOA: 04/16/2020 PCP: Chevis Pretty, FNP    Brief Narrative:  Jasmine White is an 74 y.o. female with recent hospitalization from 6/26-7/20 for AKI due to ANCA positive RPGN now on HD via right PermCath MWF, HTN, hypothyroidism, HLD and osteoarthritis presenting with dyspnea, orthopnea and hemoptysis and admitted for acute respiratory failure with hypoxia likely due to pulmonary edema, possible pneumonia and vasculitis. Appears more related to vasculitis than PNA. PCCM and nephrology following and appreciate their help with this complicated patient.    Assessment & Plan:   Active Problems:   Essential hypertension, benign   Hypothyroidism   Chronic back pain   Hyponatremia   Acute renal failure (HCC)   Acute respiratory failure with hypoxia (HCC)   Hypokalemia   HAP (hospital-acquired pneumonia)   Acute pulmonary edema (HCC)   ANCA-associated vasculitis (HCC)   Acute respiratory failure with hypoxia: resolved Patient represented to the ED with progressive shortness of breath and hemoptysis.  Recently started on dialysis for ANCA vasculitis with concern for now pulmonary involvement.  Was noted to be hypoxic with SPO2 75% requiring NRB.  Chest x-ray notable for multifocal pneumonia with CT chest extensive, dense heterogeneous and consolidative airspace disease bilaterally worsened in comparison with previous exam on 04/05/2020.  Covid-19 PCR negative.  Etiology likely multifactorial with pulmonary edema and pulmonary vasculitis most likely especially with hemoptysis, less likely infectious/pneumonia.  Initially was started on IV ciprofloxacin, cefepime and vancomycin which was eventually discontinued as vasculitis leading etiology. --Continue steroids, plasmapheresis and hemodialysis as below --Oxygen now titrated off with SPO2 94% on room air.  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  Received Rituxan on 04/19/2020, next dose 05/03/2020. --Prednisone 60 mg p.o. 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 --Continue HD per nephrology  Hypokalemia K 4.3 today --repeat electrolytes in a.m. to include magnesium  Anemia of chronic disease Hemoglobin 7.6 today. --ESA and IV iron per nephrology --Continue to closely monitor hemoglobin, transfuse for hemoglobin less than 7.0.  Essential hypertension --Continue home metoprolol succinate 100 mg p.o. daily --Hydralazine as needed --Volume management per HD  Hypothyroidism --Continue levothyroxine 82mcg PO daily  Chronic back pain/mood disorder --Continue amitriptyline 50 mg p.o. nightly  Weakness --PT/OT consult  DVT prophylaxis: Heparin Code Status: Full code Family Communication: Daughter and husband present at bedside    Disposition Plan:  Status is: Inpatient  Remains inpatient appropriate because:Persistent severe electrolyte disturbances, Ongoing diagnostic testing needed not appropriate for outpatient work up, Unsafe d/c plan and IV treatments appropriate due to intensity of illness or inability to take PO   Dispo: The patient is from: Home              Anticipated d/c is to: Home              Anticipated d/c date is: > 3 days              Patient currently is not medically stable to d/c.  Consultants:   Nephrology  Pulmonology/critical care medicine - signed off 04/24/2020  Procedures:   Plasmapheresis  Antimicrobials:   Bactrim - continues for PCP prophylaxis  Ciprofloxacin  7/26 -7/28  Cefepime 7/26 - 8/2  Vancomycin 7/26 -  8/2    Subjective: Patient seen and examined bedside, resting comfortably.  Just returned from hemodialysis, feels weak and fatigued.  Husband and daughter present at bedside and updated.  Has completed 4 of 7 sessions of plasmapheresis so far.  Next dose of Rituxan on 05/03/2020.  Poor sleep, no other complaints.  Denies headache, fever/chills/night sweats, no nausea/vomiting/diarrhea, no chest pain, palpitations, shortness of breath, no abdominal pain, no weakness.  No further hemoptysis.  Oxygenating well on room air.  No acute events overnight per nursing staff.  Objective: Vitals:   04/26/20 1030 04/26/20 1100 04/26/20 1130 04/26/20 1435  BP: (!) 141/82 (!) 141/74 (!) 149/83 133/69  Pulse: 97  96 97  Resp: (!) 33 (!) 31 20 (!) 22  Temp:    98.1 F (36.7 C)  TempSrc:    Oral  SpO2:   99% 96%  Weight:        Intake/Output Summary (Last 24 hours) at 04/26/2020 1520 Last data filed at 04/26/2020 1400 Gross per 24 hour  Intake 480 ml  Output --  Net 480 ml   Filed Weights   04/24/20 1710 04/24/20 1930 04/26/20 0835  Weight: 58 kg 58.5 kg 60.3 kg    Examination:  General exam: Appears calm and comfortable  Respiratory system: Clear to auscultation. Respiratory effort normal. Cardiovascular system: S1 & S2 heard, RRR. No JVD, murmurs, rubs, gallops or clicks. No pedal edema.  Right permacath HD noted right chest. Gastrointestinal system: Abdomen is nondistended, soft and nontender. No organomegaly or masses felt. Normal bowel sounds heard. Central nervous system: Alert and oriented. No focal neurological deficits. Extremities: Symmetric 5 x 5 power. Skin: No rashes, lesions or ulcers Psychiatry: Judgement and insight appear normal. Mood & affect appropriate.     Data Reviewed: I have personally reviewed following labs and imaging studies  CBC: Recent Labs  Lab 04/21/20 0051 04/21/20 0051 04/23/20 0113 04/23/20 1557 04/24/20 1302 04/25/20 0046 04/26/20 0333  WBC 18.9*   --  18.9*  --  12.4* 10.3 13.0*  HGB 7.5*   < > 7.4* 16.7* 7.3* 7.1* 7.6*  HCT 23.3*   < > 23.1* 49.0* 23.0* 22.3* 23.5*  MCV 94.3  --  95.1  --  96.2 94.9 97.9  PLT 142*  --  115*  --  126* 113* 156   < > = values in this interval not displayed.   Basic Metabolic Panel: Recent Labs  Lab 04/21/20 0051 04/21/20 0051 04/23/20 0113 04/23/20 1557 04/24/20 1303 04/25/20 0046 04/26/20 0333  NA 132*   < > 135 130* 131* 139 138  K 3.9   < > 4.0 4.4 4.2 3.0* 4.3  CL 93*   < > 95* 91* 88* 94* 95*  CO2 22  --  25  --  25 32 29  GLUCOSE 246*   < > 119* 142* 214* 95 114*  BUN 55*   < > 70* 87* 103* 32* 52*  CREATININE 4.69*   < > 5.01* 6.20* 6.63* 3.07* 5.22*  CALCIUM 8.5*  --  8.4*  --  7.8* 8.4* 8.2*  MG  --   --   --   --   --   --  2.1  PHOS  --   --   --   --  8.5* 4.8*  --    < > = values in this interval not displayed.   GFR: Estimated Creatinine Clearance:  8.9 mL/min (A) (by C-G formula based on SCr of 5.22 mg/dL (H)). Liver Function Tests: Recent Labs  Lab 04/24/20 1303 04/25/20 0046  ALBUMIN 2.8* 2.9*   No results for input(s): LIPASE, AMYLASE in the last 168 hours. No results for input(s): AMMONIA in the last 168 hours. Coagulation Profile: No results for input(s): INR, PROTIME in the last 168 hours. Cardiac Enzymes: No results for input(s): CKTOTAL, CKMB, CKMBINDEX, TROPONINI in the last 168 hours. BNP (last 3 results) No results for input(s): PROBNP in the last 8760 hours. HbA1C: No results for input(s): HGBA1C in the last 72 hours. CBG: Recent Labs  Lab 04/25/20 1124 04/25/20 1559 04/25/20 2124 04/26/20 0616 04/26/20 1319  GLUCAP 162* 179* 119* 108* 124*   Lipid Profile: No results for input(s): CHOL, HDL, LDLCALC, TRIG, CHOLHDL, LDLDIRECT in the last 72 hours. Thyroid Function Tests: No results for input(s): TSH, T4TOTAL, FREET4, T3FREE, THYROIDAB in the last 72 hours. Anemia Panel: No results for input(s): VITAMINB12, FOLATE, FERRITIN, TIBC, IRON,  RETICCTPCT in the last 72 hours. Sepsis Labs: No results for input(s): PROCALCITON, LATICACIDVEN in the last 168 hours.  No results found for this or any previous visit (from the past 240 hour(s)).       Radiology Studies: No results found.      Scheduled Meds: . amitriptyline  50 mg Oral QHS  . calcitRIOL  0.25 mcg Oral Q M,W,F-HD  . Chlorhexidine Gluconate Cloth  6 each Topical Q0600  . cholecalciferol  1,000 Units Oral Daily  . darbepoetin (ARANESP) injection - DIALYSIS  100 mcg Intravenous Q Sat-HD  . heparin injection (subcutaneous)  5,000 Units Subcutaneous Q8H  . insulin aspart  0-6 Units Subcutaneous TID WC  . levothyroxine  88 mcg Oral QAC breakfast  . metoprolol succinate  100 mg Oral Daily  . multivitamin  1 tablet Oral QHS  . nystatin  5 mL Oral QID  . pantoprazole  40 mg Oral Daily  . potassium chloride  20 mEq Oral Once  . predniSONE  60 mg Oral Q breakfast  . sodium chloride flush  3 mL Intravenous Once  . sulfamethoxazole-trimethoprim  1 tablet Oral Once per day on Mon Wed Fri   Continuous Infusions: . anticoagulant sodium citrate    . anticoagulant sodium citrate    . famotidine (PEPCID) IV    . sodium chloride       LOS: 10 days    Time spent: 39 minutes spent on chart review, discussion with nursing staff, consultants, updating family and interview/physical exam; more than 50% of that time was spent in counseling and/or coordination of care.    Jaidan Stachnik J [place] DO [facility] Available via Epic secure chat 7am-7pm After these hours, please refer to coverage provider listed on [email or web address] 04/26/2020, 3:20 PM

## 2020-04-27 LAB — GLUCOSE, CAPILLARY
Glucose-Capillary: 132 mg/dL — ABNORMAL HIGH (ref 70–99)
Glucose-Capillary: 132 mg/dL — ABNORMAL HIGH (ref 70–99)
Glucose-Capillary: 146 mg/dL — ABNORMAL HIGH (ref 70–99)
Glucose-Capillary: 144 mg/dL — ABNORMAL HIGH (ref 70–99)

## 2020-04-27 LAB — BASIC METABOLIC PANEL
Anion gap: 13 (ref 5–15)
BUN: 35 mg/dL — ABNORMAL HIGH (ref 8–23)
CO2: 26 mmol/L (ref 22–32)
Calcium: 7.9 mg/dL — ABNORMAL LOW (ref 8.9–10.3)
Chloride: 97 mmol/L — ABNORMAL LOW (ref 98–111)
Creatinine, Ser: 4.03 mg/dL — ABNORMAL HIGH (ref 0.44–1.00)
GFR calc Af Amer: 12 mL/min — ABNORMAL LOW (ref >60)
GFR calc non Af Amer: 10 mL/min — ABNORMAL LOW (ref >60)
Glucose, Bld: 178 mg/dL — ABNORMAL HIGH (ref 70–99)
Potassium: 3.7 mmol/L (ref 3.5–5.1)
Sodium: 136 mmol/L (ref 135–145)

## 2020-04-27 MED ORDER — ACETAMINOPHEN 325 MG PO TABS
650.0000 mg | ORAL_TABLET | ORAL | Status: DC | PRN
  Filled 2020-04-27: qty 2

## 2020-04-27 MED ORDER — ACD FORMULA A 0.73-2.45-2.2 GM/100ML VI SOLN
1000.0000 mL | Status: DC
  Filled 2020-04-27: qty 1000

## 2020-04-27 MED ORDER — MAGIC MOUTHWASH
15.0000 mL | Freq: Four times a day (QID) | ORAL | Status: DC | PRN
  Administered 2020-04-27 – 2020-05-02 (×6): 15 mL via ORAL
  Filled 2020-04-27 (×7): qty 15

## 2020-04-27 MED ORDER — CALCIUM CARBONATE ANTACID 500 MG PO CHEW
2.0000 | CHEWABLE_TABLET | ORAL | Status: AC
  Administered 2020-04-27 (×2): 400 mg via ORAL
  Filled 2020-04-27 (×2): qty 2

## 2020-04-27 MED ORDER — SODIUM CHLORIDE 0.9 % IV SOLN
3.0000 g | Freq: Once | INTRAVENOUS | Status: AC
  Administered 2020-04-27: 3 g via INTRAVENOUS
  Filled 2020-04-27: qty 30

## 2020-04-27 MED ORDER — DIPHENHYDRAMINE HCL 25 MG PO CAPS
ORAL_CAPSULE | ORAL | Status: AC
  Filled 2020-04-27: qty 1

## 2020-04-27 MED ORDER — CALCIUM CARBONATE ANTACID 500 MG PO CHEW
CHEWABLE_TABLET | ORAL | Status: AC
  Administered 2020-04-27: 400 mg
  Filled 2020-04-27: qty 2

## 2020-04-27 MED ORDER — DIPHENHYDRAMINE HCL 25 MG PO CAPS
25.0000 mg | ORAL_CAPSULE | Freq: Four times a day (QID) | ORAL | Status: DC | PRN
  Administered 2020-04-27: 25 mg via ORAL

## 2020-04-27 MED ORDER — ANTICOAGULANT SODIUM CITRATE 4% (200MG/5ML) IV SOLN
5.0000 mL | Freq: Once | Status: AC
  Administered 2020-04-27: 5 mL
  Filled 2020-04-27 (×2): qty 5

## 2020-04-27 NOTE — Plan of Care (Signed)

## 2020-04-27 NOTE — Progress Notes (Signed)
Patient ID: Jasmine White, female   DOB: Jun 23, 1946, 74 y.o.   MRN: [id number] S: Feels better O:BP (!) 154/88   Pulse 90   Temp 98.5 F (36.9 C) (Oral)   Resp 20   Wt 60.3 kg   SpO2 92%   BMI 21.46 kg/m   Intake/Output Summary (Last 24 hours) at 04/27/2020 0926 Last data filed at 04/26/2020 1700 Gross per 24 hour  Intake 480 ml  Output 1218 ml  Net -738 ml   Intake/Output: I/O last 3 completed shifts: In: 720 [P.O.:720] Out: 1218 [Other:1218]  Intake/Output this shift:  No intake/output data recorded. Weight change:  Gen: NAD CVS: rrr  Resp: bibasilar crackles Abd: +BS, soft, Nt/Nd Ext: no edema  Recent Labs  Lab 04/21/20 0051 04/23/20 0113 04/23/20 1557 04/24/20 1303 04/25/20 0046 04/26/20 0333  NA 132* 135 130* 131* 139 138  K 3.9 4.0 4.4 4.2 3.0* 4.3  CL 93* 95* 91* 88* 94* 95*  CO2 22 25  --  25 32 29  GLUCOSE 246* 119* 142* 214* 95 114*  BUN 55* 70* 87* 103* 32* 52*  CREATININE 4.69* 5.01* 6.20* 6.63* 3.07* 5.22*  ALBUMIN  --   --   --  2.8* 2.9*  --   CALCIUM 8.5* 8.4*  --  7.8* 8.4* 8.2*  PHOS  --   --   --  8.5* 4.8*  --    Liver Function Tests: Recent Labs  Lab 04/24/20 1303 04/25/20 0046  ALBUMIN 2.8* 2.9*   No results for input(s): LIPASE, AMYLASE in the last 168 hours. No results for input(s): AMMONIA in the last 168 hours. CBC: Recent Labs  Lab 04/21/20 0051 04/21/20 0051 04/23/20 0113 04/23/20 1557 04/24/20 1302 04/25/20 0046 04/26/20 0333  WBC 18.9*   < > 18.9*  --  12.4* 10.3 13.0*  HGB 7.5*   < > 7.4*   < > 7.3* 7.1* 7.6*  HCT 23.3*   < > 23.1*   < > 23.0* 22.3* 23.5*  MCV 94.3  --  95.1  --  96.2 94.9 97.9  PLT 142*   < > 115*  --  126* 113* 156   < > = values in this interval not displayed.   Cardiac Enzymes: No results for input(s): CKTOTAL, CKMB, CKMBINDEX, TROPONINI in the last 168 hours. CBG: Recent Labs  Lab 04/26/20 0616 04/26/20 1319 04/26/20 1634 04/26/20 2117 04/27/20 0619  GLUCAP 108* 124* 178* 160* 132*     Iron Studies: No results for input(s): IRON, TIBC, TRANSFERRIN, FERRITIN in the last 72 hours. Studies/Results: No results found. Marland Kitchen amitriptyline  50 mg Oral QHS  . calcitRIOL  0.25 mcg Oral Q M,W,F-HD  . calcium carbonate  2 tablet Oral Q3H  . Chlorhexidine Gluconate Cloth  6 each Topical Q0600  . cholecalciferol  1,000 Units Oral Daily  . darbepoetin (ARANESP) injection - DIALYSIS  100 mcg Intravenous Q Sat-HD  . heparin injection (subcutaneous)  5,000 Units Subcutaneous Q8H  . insulin aspart  0-6 Units Subcutaneous TID WC  . levothyroxine  88 mcg Oral QAC breakfast  . metoprolol succinate  100 mg Oral Daily  . multivitamin  1 tablet Oral QHS  . nystatin  5 mL Oral QID  . pantoprazole  40 mg Oral Daily  . potassium chloride  20 mEq Oral Once  . predniSONE  60 mg Oral Q breakfast  . sodium chloride flush  3 mL Intravenous Once  . sulfamethoxazole-trimethoprim  1 tablet Oral Once per  day on Mon Wed Fri    BMET    Component Value Date/Time   NA 138 04/26/2020 0333   NA 128 (L) 03/01/2020 0927   K 4.3 04/26/2020 0333   CL 95 (L) 04/26/2020 0333   CO2 29 04/26/2020 0333   GLUCOSE 114 (H) 04/26/2020 0333   BUN 52 (H) 04/26/2020 0333   BUN 67 (H) 03/01/2020 0927   CREATININE 5.22 (H) 04/26/2020 0333   CREATININE 0.84 12/21/2012 1552   CALCIUM 8.2 (L) 04/26/2020 0333   GFRNONAA 8 (L) 04/26/2020 0333   GFRNONAA 72 12/21/2012 1552   GFRAA 9 (L) 04/26/2020 0333   GFRAA 83 12/21/2012 1552   CBC    Component Value Date/Time   WBC 13.0 (H) 04/26/2020 0333   RBC 2.40 (L) 04/26/2020 0333   HGB 7.6 (L) 04/26/2020 0333   HGB 9.7 (L) 03/01/2020 0927   HCT 23.5 (L) 04/26/2020 0333   HCT 28.8 (L) 03/01/2020 0927   PLT 156 04/26/2020 0333   PLT 397 03/01/2020 0927   MCV 97.9 04/26/2020 0333   MCV 79 03/01/2020 0927   MCH 31.7 04/26/2020 0333   MCHC 32.3 04/26/2020 0333   RDW 19.8 (H) 04/26/2020 0333   RDW 14.7 03/01/2020 0927   LYMPHSABS 0.6 (L) 04/19/2020 0620    LYMPHSABS 1.3 03/01/2020 0927   MONOABS 0.5 04/19/2020 0620   EOSABS 0.0 04/19/2020 0620   EOSABS 0.2 03/01/2020 0927   BASOSABS 0.0 04/19/2020 0620   BASOSABS 0.1 03/01/2020 0927     Dialysis Orders: MWF - [place] 3.5hrs 400/800 2K 2.25Ca EDW 61.5kg(will need to lower as we are challenging her edw due to hypoxia) TDC LU AVF maturing   Hep 1800 unit qHD Mircera 181mcg Q2wks - last 7/23 Venofer 100mg  qHD x10 - not started Calcitriol 0.74mcg qHD  Assessment/Plan:  1. 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 Restarted on plasmapheresis and HD with UF with improvement. PCCM following and holding off on repeat bronch 1. Continue to challenge edw as tolerated.  More than 3 kg below edw prior to hd today. 2. Anti-GBM/RPGN- restarted pulse solumedrol and now on prednisone 60 mg daily. Stopped oral cytoxan and given rituxan on 04/19/20. Due for second dose on 05/03/20.  1. Resumed plasmapheresis and is s/p4/7 treatments.  2. Plan for pheresison 8/6, 8/8, and 8/10 to complete 7 treatments. 3. Plan for HD 8/7 and get back on schedule 04/30/20 3. Dialysis dependent AKI- remains anuric. Biopsy with significant crescents and dense ATN. Will need to get back on MWF HD schedule later this weekor by Monday next. 4. Anemia due to [facility] and CKD- on aranesp. No heparin with HD  5. CKD-MBD- stable 6. HTN- stable [name], MD [employer] [phone]

## 2020-04-27 NOTE — Procedures (Signed)
Pt was seen and examined while on plasmaphersis.  She is tolerating the procedure well. BP 164/93, pulse 101, temp 98.6.  No compliacations, RIJ TDC functioning well.

## 2020-04-27 NOTE — Progress Notes (Signed)
PROGRESS NOTE    Jasmine White  ZOX:[id number] DOB: 15-Jun-1946 DOA: 04/16/2020 PCP: Chevis Pretty, FNP    Brief Narrative:  Jasmine White is an 74 y.o. female with recent hospitalization from 6/26-7/20 for AKI due to ANCA positive RPGN now on HD via right PermCath MWF, HTN, hypothyroidism, HLD and osteoarthritis presenting with dyspnea, orthopnea and hemoptysis and admitted for acute respiratory failure with hypoxia likely due to pulmonary edema, possible pneumonia and vasculitis. Appears more related to vasculitis than PNA. PCCM and nephrology following and appreciate their help with this complicated patient.    Assessment & Plan:   Active Problems:   Essential hypertension, benign   Hypothyroidism   Chronic back pain   Hyponatremia   Acute renal failure (HCC)   Acute respiratory failure with hypoxia (HCC)   Hypokalemia   HAP (hospital-acquired pneumonia)   Acute pulmonary edema (HCC)   ANCA-associated vasculitis (HCC)   Acute respiratory failure with hypoxia: resolved Patient represented to the ED with progressive shortness of breath and hemoptysis.  Recently started on dialysis for ANCA vasculitis with concern for now pulmonary involvement.  Was noted to be hypoxic with SPO2 75% requiring NRB.  Chest x-ray notable for multifocal pneumonia with CT chest extensive, dense heterogeneous and consolidative airspace disease bilaterally worsened in comparison with previous exam on 04/05/2020.  Covid-19 PCR negative.  Etiology likely multifactorial with pulmonary edema and pulmonary vasculitis most likely especially with hemoptysis, less likely infectious/pneumonia.  Initially was started on IV ciprofloxacin, cefepime and vancomycin which was eventually discontinued as vasculitis leading etiology. --Continue steroids, plasmapheresis and hemodialysis as below --Oxygen now titrated off with SPO2 94% on room air.  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  Received Rituxan on 04/19/2020, next dose 05/03/2020. --Prednisone 60 mg p.o. 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 --Continue HD per nephrology  Hypokalemia K 4.3 yesterday --repeat electrolytes in a.m. to include magnesium  Anemia of chronic disease Hemoglobin 7.6 yesterday. --ESA and IV iron per nephrology --Continue to closely monitor hemoglobin, transfuse for hemoglobin less than 7.0.  Essential hypertension --Continue home metoprolol succinate 100 mg p.o. daily --Hydralazine as needed --Volume management per HD  Hypothyroidism --Continue levothyroxine 32mcg PO daily  Chronic back pain/mood disorder --Continue amitriptyline 50 mg p.o. nightly  Weakness --PT/OT following with recommendations of HH  DVT prophylaxis: Heparin Code Status: Full code Family Communication: No family present at bedside this morning, update extensively yesterday    Disposition Plan:  Status is: Inpatient  Remains inpatient appropriate because:Persistent severe electrolyte disturbances, Ongoing diagnostic testing needed not appropriate for outpatient work up, Unsafe d/c plan and IV treatments appropriate due to intensity of illness or inability to take PO   Dispo: The patient is from: Home              Anticipated d/c is to: Home              Anticipated d/c date is: > 3 days              Patient currently is not medically stable to d/c.  Consultants:   Nephrology  Pulmonology/critical care medicine - signed off 04/24/2020  Procedures:   Plasmapheresis  Antimicrobials:  Bactrim - continues for PCP prophylaxis  Ciprofloxacin 7/26  -7/28  Cefepime 7/26 - 8/2  Vancomycin 7/26 - 8/2    Subjective: Patient seen and examined bedside, resting comfortably.  Awaiting next round of plasmapheresis today, so far has completed 4 of 7 sessions of plasmapheresis so far.  Next dose of Rituxan on 05/03/2020.  No complaints this morning.  Denies headache, fever/chills/night sweats, no nausea/vomiting/diarrhea, no chest pain, palpitations, shortness of breath, no abdominal pain, no weakness. Oxygenating well on room air.  No acute events overnight per nursing staff.  Objective: Vitals:   04/27/20 0359 04/27/20 0413 04/27/20 0800 04/27/20 0908  BP: (!) 151/82 (!) 151/82 (!) 154/88 (!) 154/88  Pulse: 94 92  90  Resp: (!) 29 20    Temp: 98.5 F (36.9 C) 98.5 F (36.9 C)    TempSrc: Oral Oral Oral   SpO2: 90% 92% 92%   Weight:        Intake/Output Summary (Last 24 hours) at 04/27/2020 1237 Last data filed at 04/27/2020 1200 Gross per 24 hour  Intake 960 ml  Output --  Net 960 ml   Filed Weights   04/24/20 1710 04/24/20 1930 04/26/20 0835  Weight: 58 kg 58.5 kg 60.3 kg    Examination:  General exam: Appears calm and comfortable  Respiratory system: Clear to auscultation. Respiratory effort normal. Cardiovascular system: S1 & S2 heard, RRR. No JVD, murmurs, rubs, gallops or clicks. No pedal edema.  Right permacath HD noted right chest. Gastrointestinal system: Abdomen is nondistended, soft and nontender. No organomegaly or masses felt. Normal bowel sounds heard. Central nervous system: Alert and oriented. No focal neurological deficits. Extremities: Symmetric 5 x 5 power. Skin: No rashes, lesions or ulcers Psychiatry: Judgement and insight appear normal. Mood & affect appropriate.     Data Reviewed: I have personally reviewed following labs and imaging studies  CBC: Recent Labs  Lab 04/21/20 0051 04/21/20 0051 04/23/20 0113 04/23/20 1557 04/24/20 1302 04/25/20 0046 04/26/20 0333  WBC 18.9*  --  18.9*  --   12.4* 10.3 13.0*  HGB 7.5*   < > 7.4* 16.7* 7.3* 7.1* 7.6*  HCT 23.3*   < > 23.1* 49.0* 23.0* 22.3* 23.5*  MCV 94.3  --  95.1  --  96.2 94.9 97.9  PLT 142*  --  115*  --  126* 113* 156   < > = values in this interval not displayed.   Basic Metabolic Panel: Recent Labs  Lab 04/23/20 0113 04/23/20 0113 04/23/20 1557 04/24/20 1303 04/25/20 0046 04/26/20 0333 04/27/20 0905  NA 135   < > 130* 131* 139 138 136  K 4.0   < > 4.4 4.2 3.0* 4.3 3.7  CL 95*   < > 91* 88* 94* 95* 97*  CO2 25  --   --  25 32 29 26  GLUCOSE 119*   < > 142* 214* 95 114* 178*  BUN 70*   < > 87* 103* 32* 52* 35*  CREATININE 5.01*   < > 6.20* 6.63* 3.07* 5.22* 4.03*  CALCIUM 8.4*  --   --  7.8* 8.4* 8.2* 7.9*  MG  --   --   --   --   --  2.1  --   PHOS  --   --   --  8.5* 4.8*  --   --    < > = values in this interval not displayed.   GFR: Estimated Creatinine Clearance: 11.5 mL/min (A) (by C-G formula  based on SCr of 4.03 mg/dL (H)). Liver Function Tests: Recent Labs  Lab 04/24/20 1303 04/25/20 0046  ALBUMIN 2.8* 2.9*   No results for input(s): LIPASE, AMYLASE in the last 168 hours. No results for input(s): AMMONIA in the last 168 hours. Coagulation Profile: No results for input(s): INR, PROTIME in the last 168 hours. Cardiac Enzymes: No results for input(s): CKTOTAL, CKMB, CKMBINDEX, TROPONINI in the last 168 hours. BNP (last 3 results) No results for input(s): PROBNP in the last 8760 hours. HbA1C: No results for input(s): HGBA1C in the last 72 hours. CBG: Recent Labs  Lab 04/26/20 1319 04/26/20 1634 04/26/20 2117 04/27/20 0619 04/27/20 1135  GLUCAP 124* 178* 160* 132* 132*   Lipid Profile: No results for input(s): CHOL, HDL, LDLCALC, TRIG, CHOLHDL, LDLDIRECT in the last 72 hours. Thyroid Function Tests: No results for input(s): TSH, T4TOTAL, FREET4, T3FREE, THYROIDAB in the last 72 hours. Anemia Panel: No results for input(s): VITAMINB12, FOLATE, FERRITIN, TIBC, IRON, RETICCTPCT in the  last 72 hours. Sepsis Labs: No results for input(s): PROCALCITON, LATICACIDVEN in the last 168 hours.  No results found for this or any previous visit (from the past 240 hour(s)).       Radiology Studies: No results found.      Scheduled Meds: . amitriptyline  50 mg Oral QHS  . calcitRIOL  0.25 mcg Oral Q M,W,F-HD  . Chlorhexidine Gluconate Cloth  6 each Topical Q0600  . cholecalciferol  1,000 Units Oral Daily  . darbepoetin (ARANESP) injection - DIALYSIS  100 mcg Intravenous Q Sat-HD  . heparin injection (subcutaneous)  5,000 Units Subcutaneous Q8H  . insulin aspart  0-6 Units Subcutaneous TID WC  . levothyroxine  88 mcg Oral QAC breakfast  . metoprolol succinate  100 mg Oral Daily  . multivitamin  1 tablet Oral QHS  . nystatin  5 mL Oral QID  . pantoprazole  40 mg Oral Daily  . potassium chloride  20 mEq Oral Once  . predniSONE  60 mg Oral Q breakfast  . sodium chloride flush  3 mL Intravenous Once  . sulfamethoxazole-trimethoprim  1 tablet Oral Once per day on Mon Wed Fri   Continuous Infusions: . anticoagulant sodium citrate    . anticoagulant sodium citrate    . calcium gluconate    . citrate dextrose    . famotidine (PEPCID) IV    . sodium chloride       LOS: 11 days    Time spent: 36 minutes spent on chart review, discussion with nursing staff, consultants, updating family and interview/physical exam; more than 50% of that time was spent in counseling and/or coordination of care.    Hughey Rittenberry J [place] DO [facility] Available via Epic secure chat 7am-7pm After these hours, please refer to coverage provider listed on [email or web address] 04/27/2020, 12:37 PM

## 2020-04-27 NOTE — Evaluation (Signed)
Occupational Therapy Evaluation and Discharge Patient Details Name: Jasmine White MRN: [id number] DOB: December 10, 1945 Today's Date: 04/27/2020    History of Present Illness 74 y.o. female with recent hospitalization from 6/26-7/20 for AKI due to ANCA positive RPGN now on HD via right PermCath MWF, HTN, hypothyroidism, HLD and osteoarthritis presenting with dyspnea, orthopnea and hemoptysis and admitted for acute respiratory failure with hypoxia likely due to pulmonary edema, possible pneumonia and vasculitis.    Clinical Impression   Pt is overall functioning at a set up supervision level in ADL and ADL transfers. Recommending ADL with nursing staff. No OT needs.    Follow Up Recommendations  No OT follow up    Equipment Recommendations  None recommended by OT    Recommendations for Other Services       Precautions / Restrictions Precautions Precautions: Fall      Mobility Bed Mobility               General bed mobility comments: pt received and left in recliner  Transfers Overall transfer level: Modified independent Equipment used: Rolling walker (2 wheeled)                  Balance     Sitting balance-Leahy Scale: Normal     Standing balance support: No upper extremity supported Standing balance-Leahy Scale: Fair                             ADL either performed or assessed with clinical judgement   ADL                                         General ADL Comments: pt has been completing ADL and mobility with RW with supervision. Reviewed energy conservation strategies.     Vision Baseline Vision/History: Wears glasses Wears Glasses: At all times       Perception     Praxis      Pertinent Vitals/Pain Pain Assessment: No/denies pain     Hand Dominance Right   Extremity/Trunk Assessment Upper Extremity Assessment Upper Extremity Assessment: Overall WFL for tasks assessed   Lower Extremity Assessment Lower  Extremity Assessment: Defer to PT evaluation   Cervical / Trunk Assessment Cervical / Trunk Assessment: Normal   Communication Communication Communication: No difficulties   Cognition Arousal/Alertness: Awake/alert Behavior During Therapy: WFL for tasks assessed/performed Overall Cognitive Status: Within Functional Limits for tasks assessed                                     General Comments       Exercises     Shoulder Instructions      Home Living Family/patient expects to be discharged to:: Private residence Living Arrangements: Spouse/significant other Available Help at Discharge: Family;Other (Comment) (102 year old grandson) Type of Home: House Home Access: Ramped entrance     Home Layout: Two level;Able to live on main level with bedroom/bathroom     Bathroom Shower/Tub: Occupational psychologist: Standard     Home Equipment: Grab bars - tub/shower;Shower seat - built in;Walker - 2 wheels;Wheelchair - [employer] - 4 wheels          Prior Functioning/Environment Level of Independence: Independent with assistive device(s)  Comments: ambulating with Rollator since recent admission        OT Problem List:        OT Treatment/Interventions:      OT Goals(Current goals can be found in the care plan section) Acute Rehab OT Goals Patient Stated Goal: to go home and stay home  OT Frequency:     Barriers to D/C:            Co-evaluation              AM-PAC OT "6 Clicks" Daily Activity     Outcome Measure Help from another person eating meals?: None Help from another person taking care of personal grooming?: None Help from another person toileting, which includes using toliet, bedpan, or urinal?: None Help from another person bathing (including washing, rinsing, drying)?: None Help from another person to put on and taking off regular upper body clothing?: None Help from another person to put on and taking off  regular lower body clothing?: None 6 Click Score: 24   End of Session Equipment Utilized During Treatment: Rolling walker  Activity Tolerance: Patient tolerated treatment well Patient left: in chair;with call bell/phone within reach  OT Visit Diagnosis: Other abnormalities of gait and mobility (R26.89)                Time: 1660-6301 OT Time Calculation (min): 24 min Charges:  OT General Charges $OT Visit: 1 Visit OT Evaluation $OT Eval Low Complexity: 1 Low  Jasmine White, OTR/L Acute Rehabilitation Services Pager: [phone] Office: [phone]  Jasmine White 04/27/2020, 11:54 AM

## 2020-04-28 LAB — BASIC METABOLIC PANEL
Anion gap: 17 — ABNORMAL HIGH (ref 5–15)
BUN: 47 mg/dL — ABNORMAL HIGH (ref 8–23)
CO2: 27 mmol/L (ref 22–32)
Calcium: 8.7 mg/dL — ABNORMAL LOW (ref 8.9–10.3)
Chloride: 94 mmol/L — ABNORMAL LOW (ref 98–111)
Creatinine, Ser: 5.21 mg/dL — ABNORMAL HIGH (ref 0.44–1.00)
GFR calc Af Amer: 9 mL/min — ABNORMAL LOW (ref >60)
GFR calc non Af Amer: 8 mL/min — ABNORMAL LOW (ref >60)
Glucose, Bld: 108 mg/dL — ABNORMAL HIGH (ref 70–99)
Potassium: 3.9 mmol/L (ref 3.5–5.1)
Sodium: 138 mmol/L (ref 135–145)

## 2020-04-28 LAB — CBC
HCT: 20.3 % — ABNORMAL LOW (ref 36.0–46.0)
Hemoglobin: 6.4 g/dL — CL (ref 12.0–15.0)
MCH: 30.6 pg (ref 26.0–34.0)
MCHC: 31.5 g/dL (ref 30.0–36.0)
MCV: 97.1 fL (ref 80.0–100.0)
Platelets: 144 10*3/uL — ABNORMAL LOW (ref 150–400)
RBC: 2.09 MIL/uL — ABNORMAL LOW (ref 3.87–5.11)
RDW: 19.4 % — ABNORMAL HIGH (ref 11.5–15.5)
WBC: 10.6 10*3/uL — ABNORMAL HIGH (ref 4.0–10.5)
nRBC: 0 % (ref 0.0–0.2)

## 2020-04-28 LAB — MAGNESIUM: Magnesium: 2.1 mg/dL (ref 1.7–2.4)

## 2020-04-28 LAB — GLUCOSE, CAPILLARY
Glucose-Capillary: 102 mg/dL — ABNORMAL HIGH (ref 70–99)
Glucose-Capillary: 107 mg/dL — ABNORMAL HIGH (ref 70–99)
Glucose-Capillary: 213 mg/dL — ABNORMAL HIGH (ref 70–99)
Glucose-Capillary: 186 mg/dL — ABNORMAL HIGH (ref 70–99)

## 2020-04-28 LAB — PREPARE RBC (CROSSMATCH)

## 2020-04-28 MED ORDER — SODIUM CHLORIDE 0.9% IV SOLUTION: Freq: Once | INTRAVENOUS | Status: DC

## 2020-04-28 MED ORDER — DARBEPOETIN ALFA 100 MCG/0.5ML IJ SOSY
PREFILLED_SYRINGE | INTRAMUSCULAR | Status: AC
  Filled 2020-04-28: qty 0.5

## 2020-04-28 MED ORDER — CALCITRIOL 0.25 MCG PO CAPS
ORAL_CAPSULE | ORAL | Status: AC
  Filled 2020-04-28: qty 1

## 2020-04-28 NOTE — Progress Notes (Signed)
[facility] Progress Note   Subjective: Seen on HD, low HGB 6.4. C/O being tired but otherwise seems OK. On HD via [facility] tolerating well.   Objective Vitals:   04/28/20 0645 04/28/20 0655 04/28/20 0700 04/28/20 0730  BP: (!) 144/59 (!) 147/83 (!) 153/83 130/85  Pulse: 97 99 94 86  Resp: (!) 35 (!) 29 (!) 22 (!) 22  Temp: 97.6 F (36.4 C)     TempSrc: Oral     SpO2: 99%   98%  Weight: 59.6 kg      Physical Exam General: Pleasant elderly female in NAD Heart: S1,S2 RRR Lungs: Slightly decreased in bases otherwise CTAB Abdomen: S, NT Extremities: No LE edema Dialysis Access: L AVF + bruit RIJ TDC blood lines connected. No issue with TDC.    Additional Objective Labs: Basic Metabolic Panel: Recent Labs  Lab 04/24/20 1303 04/24/20 1303 04/25/20 0046 04/25/20 0046 04/26/20 0333 04/27/20 0905 04/28/20 0134  NA 131*   < > 139   < > 138 136 138  K 4.2   < > 3.0*   < > 4.3 3.7 3.9  CL 88*   < > 94*   < > 95* 97* 94*  CO2 25   < > 32   < > 29 26 27   GLUCOSE 214*   < > 95   < > 114* 178* 108*  BUN 103*   < > 32*   < > 52* 35* 47*  CREATININE 6.63*   < > 3.07*   < > 5.22* 4.03* 5.21*  CALCIUM 7.8*   < > 8.4*   < > 8.2* 7.9* 8.7*  PHOS 8.5*  --  4.8*  --   --   --   --    < > = values in this interval not displayed.   Liver Function Tests: Recent Labs  Lab 04/24/20 1303 04/25/20 0046  ALBUMIN 2.8* 2.9*   No results for input(s): LIPASE, AMYLASE in the last 168 hours. CBC: Recent Labs  Lab 04/23/20 0113 04/23/20 1557 04/24/20 1302 04/24/20 1302 04/25/20 0046 04/26/20 0333 04/28/20 0134  WBC 18.9*  --  12.4*   < > 10.3 13.0* 10.6*  HGB 7.4*   < > 7.3*   < > 7.1* 7.6* 6.4*  HCT 23.1*   < > 23.0*   < > 22.3* 23.5* 20.3*  MCV 95.1  --  96.2  --  94.9 97.9 97.1  PLT 115*  --  126*   < > 113* 156 144*   < > = values in this interval not displayed.   Blood Culture    Component Value Date/Time   SDES FLUID PLEURAL RIGHT 04/05/2020 1530   SDES  BRONCHIAL ALVEOLAR LAVAGE 04/05/2020 1530   SPECREQUEST NONE 04/05/2020 1530   SPECREQUEST NONE 04/05/2020 1530   CULT  04/05/2020 1530    NO GROWTH 3 DAYS Performed at [facility], [place] [address], [place], [place] [address]    CULT  04/05/2020 1530    Consistent with normal respiratory flora. Performed at [facility], [place] [address], [place], [place] [address]    REPTSTATUS 04/09/2020 FINAL 04/05/2020 1530   REPTSTATUS 04/07/2020 FINAL 04/05/2020 1530    Cardiac Enzymes: No results for input(s): CKTOTAL, CKMB, CKMBINDEX, TROPONINI in the last 168 hours. CBG: Recent Labs  Lab 04/27/20 0619 04/27/20 1135 04/27/20 1653 04/27/20 2114 04/28/20 0557  GLUCAP 132* 132* 146* 144* 102*   Iron Studies: No results for input(s):  IRON, TIBC, TRANSFERRIN, FERRITIN in the last 72 hours. @lablastinr3 @ Studies/Results: No results found. Medications:  anticoagulant sodium citrate     famotidine (PEPCID) IV     sodium chloride      sodium chloride   Intravenous Once   amitriptyline  50 mg Oral QHS   calcitRIOL  0.25 mcg Oral Q M,W,F-HD   Chlorhexidine Gluconate Cloth  6 each Topical Q0600   cholecalciferol  1,000 Units Oral Daily   darbepoetin (ARANESP) injection - DIALYSIS  100 mcg Intravenous Q Sat-HD   heparin injection (subcutaneous)  5,000 Units Subcutaneous Q8H   insulin aspart  0-6 Units Subcutaneous TID WC   levothyroxine  88 mcg Oral QAC breakfast   metoprolol succinate  100 mg Oral Daily   multivitamin  1 tablet Oral QHS   nystatin  5 mL Oral QID   pantoprazole  40 mg Oral Daily   potassium chloride  20 mEq Oral Once   predniSONE  60 mg Oral Q breakfast   sodium chloride flush  3 mL Intravenous Once   sulfamethoxazole-trimethoprim  1 tablet Oral Once per day on Mon Wed Fri     Dialysis Orders: MWF - [place] 3.5hrs 400/800 2K 2.25Ca EDW 61.5kg(will need to lower as we are challenging her edw due to hypoxia) TDC LU AVF maturing    Hep 1800 unit qHD Mircera 122mcg Q2wks - last 7/23 Venofer 100mg  qHD x10 - not started Calcitriol 0.13mcg qHD  Assessment/Plan:  1. 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 Restarted on plasmapheresis and HD with UF with improvement. PCCM following and holding off on repeat bronch 2. Continue to challenge edw as tolerated. HD today attempting 4 liters.  3. Anti-GBM/RPGN- restarted pulse solumedrol and now on prednisone 60 mg daily. Stopped oral cytoxan and given rituxan on 04/19/20. Due for second dose on 05/03/20.  1. Resumed plasmapheresis and is s/p4/7 treatments.  2. Plan for pheresison 8/6, 8/8, and 8/10to complete 7 treatments. 3. Plan for HD 8/7 and get back on schedule 04/30/20 4. Dialysis dependent AKI- remains anuric. Biopsy with significant crescents and dense ATN. Will need to get back on MWF HD schedule later this weekor by Monday next. 5. Anemia due to [facility] and CKD- on aranesp. No heparin with HD. HGB 6.4 today. Giving 2 units PRBCs today.   6. CKD-MBD- stable 7. HTN- stable  Nikkie Liming H. Sadia Belfiore NP-C 04/28/2020, 8:06 AM  [employer] [phone]

## 2020-04-28 NOTE — Progress Notes (Signed)
PROGRESS NOTE    Jasmine White  JOA:[id number] DOB: 1945/10/25 DOA: 04/16/2020 PCP: Chevis Pretty, FNP    Brief Narrative:  Jasmine White is an 74 y.o. female with recent hospitalization from 6/26-7/20 for AKI due to ANCA positive RPGN now on HD via right PermCath MWF, HTN, hypothyroidism, HLD and osteoarthritis presenting with dyspnea, orthopnea and hemoptysis and admitted for acute respiratory failure with hypoxia likely due to pulmonary edema, possible pneumonia and vasculitis. Appears more related to vasculitis than PNA. PCCM and nephrology following and appreciate their help with this complicated patient.    Assessment & Plan:   Active Problems:   Essential hypertension, benign   Hypothyroidism   Chronic back pain   Hyponatremia   Acute renal failure (HCC)   Acute respiratory failure with hypoxia (HCC)   Hypokalemia   HAP (hospital-acquired pneumonia)   Acute pulmonary edema (HCC)   ANCA-associated vasculitis (HCC)   Acute respiratory failure with hypoxia: resolved Patient represented to the ED with progressive shortness of breath and hemoptysis.  Recently started on dialysis for ANCA vasculitis with concern for now pulmonary involvement.  Was noted to be hypoxic with SPO2 75% requiring NRB.  Chest x-ray notable for multifocal pneumonia with CT chest extensive, dense heterogeneous and consolidative airspace disease bilaterally worsened in comparison with previous exam on 04/05/2020.  Covid-19 PCR negative.  Etiology likely multifactorial with pulmonary edema and pulmonary vasculitis most likely especially with hemoptysis, less likely infectious/pneumonia.  Initially was started on IV ciprofloxacin, cefepime and vancomycin which was eventually discontinued as vasculitis leading etiology. --Continue steroids, plasmapheresis and hemodialysis as below --Oxygen now titrated off with SPO2 94% on room air.  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  Received Rituxan on 04/19/2020, next dose 05/03/2020. --Prednisone 60 mg p.o. 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 --Continue HD per nephrology --Patient was to have AVF (placed 7/12 Dr. Donnetta Hutching) evaluated by vascular surgery on 05/01/2020 outpatient, will have vascular surgery evaluate next week.  Hypokalemia K 4.3 yesterday --repeat electrolytes in a.m. to include magnesium  Anemia of chronic disease Hemoglobin 6.4 today. --tranfuse 2u PRBC with HD today  --ESA and IV iron per nephrology --repeat CBC in AM --Continue to closely monitor hemoglobin, transfuse for hemoglobin less than 7.0.  Essential hypertension --Continue home metoprolol succinate 100 mg p.o. daily --Hydralazine as needed --Volume management per HD  Hypothyroidism --Continue levothyroxine 31mcg PO daily  Chronic back pain/mood disorder --Continue amitriptyline 50 mg p.o. nightly  Weakness --PT/OT following with recommendations of HH PT  DVT prophylaxis: Heparin Code Status: Full code Family Communication: Daughter and husband at bedside this afternoon Disposition Plan:  Status is: Inpatient  Remains inpatient appropriate because:Persistent severe electrolyte disturbances, Ongoing diagnostic testing needed not appropriate for outpatient work up, Unsafe d/c plan and IV treatments appropriate due to intensity of illness or inability to take PO   Dispo: The patient is from: Home              Anticipated d/c is to: Home              Anticipated d/c date is: > 3 days   05/03/2020 after Rituxin infusion  Patient currently is not medically stable to  d/c.  Consultants:   Nephrology  Pulmonology/critical care medicine - signed off 04/24/2020  Procedures:   Plasmapheresis  Antimicrobials:   Bactrim - continues for PCP prophylaxis  Ciprofloxacin 7/26 -7/28  Cefepime 7/26 - 8/2  Vancomycin 7/26 - 8/2    Subjective: Patient seen and examined bedside, resting comfortably.  Husband and daughter present.  Hemoglobin down to 6.4 this morning transfused 2 units PRBCs with hemodialysis today.  Continues with some fatigue and weakness.  No other specific complaints at this time.  Has completed 5 out of 7 sessions of plasmapheresis with next dose of Rituxan on 05/03/2020.   Objective: Vitals:   04/28/20 1000 04/28/20 1024 04/28/20 1030 04/28/20 1056  BP: (!) 143/84 129/78 (!) 149/90 (!) 141/73  Pulse: 87 98 98 100  Resp: 20 (!) 25 (!) 22 20  Temp:   (!) 97.4 F (36.3 C) (!) 97.5 F (36.4 C)  TempSrc:   Axillary Axillary  SpO2: 100% 100% 100% 100%  Weight:  56.3 kg      Intake/Output Summary (Last 24 hours) at 04/28/2020 1601 Last data filed at 04/28/2020 1200 Gross per 24 hour  Intake 1195 ml  Output 3370 ml  Net -2175 ml   Filed Weights   04/26/20 0835 04/28/20 0645 04/28/20 1024  Weight: 60.3 kg 59.6 kg 56.3 kg    Examination:  General exam: Appears calm and comfortable  Respiratory system: Clear to auscultation. Respiratory effort normal. Cardiovascular system: S1 & S2 heard, RRR. No JVD, murmurs, rubs, gallops or clicks. No pedal edema.  Right permacath HD noted right chest. Gastrointestinal system: Abdomen is nondistended, soft and nontender. No organomegaly or masses felt. Normal bowel sounds heard. Central nervous system: Alert and oriented. No focal neurological deficits. Extremities: Symmetric 5 x 5 power.  Skin: No rashes, lesions or ulcers Psychiatry: Judgement and insight appear normal. Mood & affect appropriate.     Data Reviewed: I have personally reviewed following labs and imaging  studies  CBC: Recent Labs  Lab 04/23/20 0113 04/23/20 0113 04/23/20 1557 04/24/20 1302 04/25/20 0046 04/26/20 0333 04/28/20 0134  WBC 18.9*  --   --  12.4* 10.3 13.0* 10.6*  HGB 7.4*   < > 16.7* 7.3* 7.1* 7.6* 6.4*  HCT 23.1*   < > 49.0* 23.0* 22.3* 23.5* 20.3*  MCV 95.1  --   --  96.2 94.9 97.9 97.1  PLT 115*  --   --  126* 113* 156 144*   < > = values in this interval not displayed.   Basic Metabolic Panel: Recent Labs  Lab 04/24/20 1303 04/25/20 0046 04/26/20 0333 04/27/20 0905 04/28/20 0134  NA 131* 139 138 136 138  K 4.2 3.0* 4.3 3.7 3.9  CL 88* 94* 95* 97* 94*  CO2 25 32 29 26 27   GLUCOSE 214* 95 114* 178* 108*  BUN 103* 32* 52* 35* 47*  CREATININE 6.63* 3.07* 5.22* 4.03* 5.21*  CALCIUM 7.8* 8.4* 8.2* 7.9* 8.7*  MG  --   --  2.1  --  2.1  PHOS 8.5* 4.8*  --   --   --    GFR: Estimated Creatinine Clearance: 8.4 mL/min (A) (by C-G formula based on SCr of 5.21 mg/dL (H)). Liver Function Tests: Recent Labs  Lab 04/24/20 1303 04/25/20 0046  ALBUMIN 2.8* 2.9*   No results for input(s): LIPASE, AMYLASE in the last 168 hours. No results for input(s): AMMONIA in the last 168 hours. Coagulation Profile:  No results for input(s): INR, PROTIME in the last 168 hours. Cardiac Enzymes: No results for input(s): CKTOTAL, CKMB, CKMBINDEX, TROPONINI in the last 168 hours. BNP (last 3 results) No results for input(s): PROBNP in the last 8760 hours. HbA1C: No results for input(s): HGBA1C in the last 72 hours. CBG: Recent Labs  Lab 04/27/20 1135 04/27/20 1653 04/27/20 2114 04/28/20 0557 04/28/20 1107  GLUCAP 132* 146* 144* 102* 107*   Lipid Profile: No results for input(s): CHOL, HDL, LDLCALC, TRIG, CHOLHDL, LDLDIRECT in the last 72 hours. Thyroid Function Tests: No results for input(s): TSH, T4TOTAL, FREET4, T3FREE, THYROIDAB in the last 72 hours. Anemia Panel: No results for input(s): VITAMINB12, FOLATE, FERRITIN, TIBC, IRON, RETICCTPCT in the last 72  hours. Sepsis Labs: No results for input(s): PROCALCITON, LATICACIDVEN in the last 168 hours.  No results found for this or any previous visit (from the past 240 hour(s)).       Radiology Studies: No results found.      Scheduled Meds: . sodium chloride   Intravenous Once  . amitriptyline  50 mg Oral QHS  . calcitRIOL      . calcitRIOL  0.25 mcg Oral Q M,W,F-HD  . Chlorhexidine Gluconate Cloth  6 each Topical Q0600  . cholecalciferol  1,000 Units Oral Daily  . Darbepoetin Alfa      . darbepoetin (ARANESP) injection - DIALYSIS  100 mcg Intravenous Q Sat-HD  . heparin injection (subcutaneous)  5,000 Units Subcutaneous Q8H  . insulin aspart  0-6 Units Subcutaneous TID WC  . levothyroxine  88 mcg Oral QAC breakfast  . metoprolol succinate  100 mg Oral Daily  . multivitamin  1 tablet Oral QHS  . nystatin  5 mL Oral QID  . pantoprazole  40 mg Oral Daily  . potassium chloride  20 mEq Oral Once  . predniSONE  60 mg Oral Q breakfast  . sodium chloride flush  3 mL Intravenous Once  . sulfamethoxazole-trimethoprim  1 tablet Oral Once per day on Mon Wed Fri   Continuous Infusions: . anticoagulant sodium citrate    . famotidine (PEPCID) IV    . sodium chloride       LOS: 12 days    Time spent: 36 minutes spent on chart review, discussion with nursing staff, consultants, updating family and interview/physical exam; more than 50% of that time was spent in counseling and/or coordination of care.    Polina Burmaster J [place] DO [facility] Available via Epic secure chat 7am-7pm After these hours, please refer to coverage provider listed on [email or web address] 04/28/2020, 4:01 PM

## 2020-04-28 NOTE — Progress Notes (Signed)
CRITICAL VALUE ALERT  Critical Value:  Hemoglobin=6.4  Date & Time Notied:  04/28/2020(02:20 am)  Provider Notified: Dr Marlowe Sax  Orders Received/Actions taken: To be transfused during HD as per Dr Marlowe Sax. HD RN (Obas) informed.

## 2020-04-29 ENCOUNTER — Inpatient Hospital Stay (HOSPITAL_COMMUNITY): Payer: Medicare PPO

## 2020-04-29 LAB — BASIC METABOLIC PANEL
Anion gap: 17 — ABNORMAL HIGH (ref 5–15)
BUN: 45 mg/dL — ABNORMAL HIGH (ref 8–23)
CO2: 22 mmol/L (ref 22–32)
Calcium: 8.6 mg/dL — ABNORMAL LOW (ref 8.9–10.3)
Chloride: 96 mmol/L — ABNORMAL LOW (ref 98–111)
Creatinine, Ser: 4.46 mg/dL — ABNORMAL HIGH (ref 0.44–1.00)
GFR calc Af Amer: 11 mL/min — ABNORMAL LOW (ref >60)
GFR calc non Af Amer: 9 mL/min — ABNORMAL LOW (ref >60)
Glucose, Bld: 180 mg/dL — ABNORMAL HIGH (ref 70–99)
Potassium: 4 mmol/L (ref 3.5–5.1)
Sodium: 135 mmol/L (ref 135–145)

## 2020-04-29 LAB — TYPE AND SCREEN
ABO/RH(D): O POS
Antibody Screen: NEGATIVE
Unit division: 0
Unit division: 0

## 2020-04-29 LAB — THERAPEUTIC PLASMA EXCHANGE (BLOOD BANK)
Plasma Exchange: 3000
Plasma volume needed: 3000
Unit division: 0
Unit division: 0
Unit division: 0
Unit division: 0
Unit division: 0
Unit division: 0
Unit division: 0

## 2020-04-29 LAB — CBC
HCT: 33.3 % — ABNORMAL LOW (ref 36.0–46.0)
Hemoglobin: 11 g/dL — ABNORMAL LOW (ref 12.0–15.0)
MCH: 30.6 pg (ref 26.0–34.0)
MCHC: 33 g/dL (ref 30.0–36.0)
MCV: 92.5 fL (ref 80.0–100.0)
Platelets: 126 10*3/uL — ABNORMAL LOW (ref 150–400)
RBC: 3.6 MIL/uL — ABNORMAL LOW (ref 3.87–5.11)
RDW: 18.4 % — ABNORMAL HIGH (ref 11.5–15.5)
WBC: 15.3 10*3/uL — ABNORMAL HIGH (ref 4.0–10.5)
nRBC: 0 % (ref 0.0–0.2)

## 2020-04-29 LAB — BPAM RBC
Blood Product Expiration Date: 202108312359
Blood Product Expiration Date: 202109012359
ISSUE DATE / TIME: 202108070813
ISSUE DATE / TIME: 202108070813
Unit Type and Rh: 5100
Unit Type and Rh: 5100

## 2020-04-29 LAB — POCT I-STAT, CHEM 8
BUN: 37 mg/dL — ABNORMAL HIGH (ref 8–23)
Calcium, Ion: <0.3 mmol/L — CL (ref 1.15–1.40)
Chloride: 94 mmol/L — ABNORMAL LOW (ref 98–111)
Creatinine, Ser: 4.3 mg/dL — ABNORMAL HIGH (ref 0.44–1.00)
Glucose, Bld: 186 mg/dL — ABNORMAL HIGH (ref 70–99)
HCT: 30 % — ABNORMAL LOW (ref 36.0–46.0)
Hemoglobin: 10.2 g/dL — ABNORMAL LOW (ref 12.0–15.0)
Potassium: 3.3 mmol/L — ABNORMAL LOW (ref 3.5–5.1)
Sodium: 143 mmol/L (ref 135–145)
TCO2: 22 mmol/L (ref 22–32)

## 2020-04-29 LAB — GLUCOSE, CAPILLARY
Glucose-Capillary: 103 mg/dL — ABNORMAL HIGH (ref 70–99)
Glucose-Capillary: 157 mg/dL — ABNORMAL HIGH (ref 70–99)
Glucose-Capillary: 117 mg/dL — ABNORMAL HIGH (ref 70–99)

## 2020-04-29 LAB — MAGNESIUM: Magnesium: 2.2 mg/dL (ref 1.7–2.4)

## 2020-04-29 MED ORDER — DIPHENHYDRAMINE HCL 25 MG PO CAPS
ORAL_CAPSULE | ORAL | Status: AC
  Filled 2020-04-29: qty 1

## 2020-04-29 MED ORDER — DIPHENHYDRAMINE HCL 25 MG PO CAPS
25.0000 mg | ORAL_CAPSULE | Freq: Four times a day (QID) | ORAL | Status: DC | PRN
  Administered 2020-04-29: 25 mg via ORAL

## 2020-04-29 MED ORDER — CALCIUM GLUCONATE-NACL 2-0.675 GM/100ML-% IV SOLN
2.0000 g | Freq: Once | INTRAVENOUS | Status: DC
  Filled 2020-04-29: qty 100

## 2020-04-29 MED ORDER — ACD FORMULA A 0.73-2.45-2.2 GM/100ML VI SOLN
1000.0000 mL | Status: DC
  Filled 2020-04-29 (×3): qty 1000

## 2020-04-29 MED ORDER — CALCIUM CARBONATE ANTACID 500 MG PO CHEW
CHEWABLE_TABLET | ORAL | Status: AC
  Administered 2020-04-29: 400 mg via ORAL
  Filled 2020-04-29: qty 1

## 2020-04-29 MED ORDER — ACD FORMULA A 0.73-2.45-2.2 GM/100ML VI SOLN
Status: AC
  Administered 2020-04-29: 1000 mL
  Filled 2020-04-29: qty 500

## 2020-04-29 MED ORDER — ACETAMINOPHEN 325 MG PO TABS
650.0000 mg | ORAL_TABLET | ORAL | Status: DC | PRN
  Filled 2020-04-29: qty 2

## 2020-04-29 MED ORDER — CALCIUM CARBONATE ANTACID 500 MG PO CHEW
2.0000 | CHEWABLE_TABLET | ORAL | Status: AC
  Administered 2020-04-29: 400 mg via ORAL

## 2020-04-29 MED ORDER — ANTICOAGULANT SODIUM CITRATE 4% (200MG/5ML) IV SOLN
5.0000 mL | Freq: Once | Status: AC
  Administered 2020-04-29: 5 mL
  Filled 2020-04-29: qty 5

## 2020-04-29 NOTE — Progress Notes (Addendum)
[facility] Progress Note   Subjective: c/o SOB started after HD last night, no cough or leg swelling  Objective Vitals:   04/28/20 2000 04/29/20 0006 04/29/20 0352 04/29/20 0722  BP:  (!) 145/87 (!) 148/85 (!) 144/74  Pulse: 91 89 86 92  Resp: 20 19 20 20   Temp:  98.4 F (36.9 C) (!) 97.5 F (36.4 C) 97.7 F (36.5 C)  TempSrc:  Oral Oral Oral  SpO2: 95% 98% 95% 100%  Weight:       Physical Exam General: Pleasant elderly female in NAD NO jvd Heart: S1,S2 RRR Lungs: bilat basilar crackles L > R Abdomen: S, NT Extremities: No LE edema Dialysis Access: L AVF + bruit, RIJ TDC  Dialysis Orders: MWF - [facility] 3.5h  400/800  2/2.25 bath   61.5kg (lower at dc)  No heparin (due to presumed pulm hemorrhage, was on Hep 1800u) TDC  +LUA AVF maturing   Mircera 174mcg Q2wks - last 7/23 Venofer 100mg  qHD x10 - not started yet Calcitriol 0.45mcg qHD    7/29/ 21 - last CXR diffuse bilat infiltrates, near white-out   04/29/20  CXR - today - almost completely resolved infiltrates  Assessment/Plan: 1. 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 Restarted on plasmapheresis and HD with improvement. PCCM following and holding off on repeat bronch 2. BP/ volume - BP's normal, good UF yest on HD 4 L off. Under dry wt but w/ some SOB as below cont to lower vol as tolerated.  3. SOB - new last night, has rales but CXR looks quite good compared to admit film and not grossly vol overloaded. Will plan HD in am w/ good UF as tolerated.  4. Anti-GBM/RPGN- re-pulsed w/ IV solumedrol and now on prednisone 60 mg daily. Stopped oral cytoxan and gave rituxan on 04/19/20. Due for second dose on 05/03/20.  1. Resumed plasmapheresis and is s/p5/7 treatments. Getting pheresis today and then 8/10 will be last Rx 2. Plan for next HD Monday 5. Dialysis dependent AKI- remains anuric. Biopsy with significant crescents and dense ATN. Will  need to get back on MWF sched as above.  6. Anemia due to [facility] and CKD- on aranesp. No heparin with HD for now. HGB 6.4 yesterday, Sp 2u prbc on 8/7, CBC pend today.  7. CKD-MBD- stable 8. HTN- stable  Kelly Splinter, MD 04/29/2020, 10:21 AM  Additional Objective Labs: Basic Metabolic Panel: Recent Labs  Lab 04/24/20 1303 04/24/20 1303 04/25/20 0046 04/25/20 0046 04/26/20 0333 04/27/20 0905 04/28/20 0134  NA 131*   < > 139   < > 138 136 138  K 4.2   < > 3.0*   < > 4.3 3.7 3.9  CL 88*   < > 94*   < > 95* 97* 94*  CO2 25   < > 32   < > 29 26 27   GLUCOSE 214*   < > 95   < > 114* 178* 108*  BUN 103*   < > 32*   < > 52* 35* 47*  CREATININE 6.63*   < > 3.07*   < > 5.22* 4.03* 5.21*  CALCIUM 7.8*   < > 8.4*   < > 8.2* 7.9* 8.7*  PHOS 8.5*  --  4.8*  --   --   --   --    < > = values in this interval not displayed.   Liver Function Tests: Recent Labs  Lab 04/24/20 1303  04/25/20 0046  ALBUMIN 2.8* 2.9*   No results for input(s): LIPASE, AMYLASE in the last 168 hours. CBC: Recent Labs  Lab 04/23/20 0113 04/23/20 1557 04/24/20 1302 04/24/20 1302 04/25/20 0046 04/26/20 0333 04/28/20 0134  WBC 18.9*  --  12.4*   < > 10.3 13.0* 10.6*  HGB 7.4*   < > 7.3*   < > 7.1* 7.6* 6.4*  HCT 23.1*   < > 23.0*   < > 22.3* 23.5* 20.3*  MCV 95.1  --  96.2  --  94.9 97.9 97.1  PLT 115*  --  126*   < > 113* 156 144*   < > = values in this interval not displayed.   Blood Culture    Component Value Date/Time   SDES FLUID PLEURAL RIGHT 04/05/2020 1530   SDES BRONCHIAL ALVEOLAR LAVAGE 04/05/2020 1530   SPECREQUEST NONE 04/05/2020 1530   SPECREQUEST NONE 04/05/2020 1530   CULT  04/05/2020 1530    NO GROWTH 3 DAYS Performed at [facility], [place] [address] [place], [place] [address]    CULT  04/05/2020 1530    Consistent with normal respiratory flora. Performed at [facility], [place] [address] [place], [place] [address]    REPTSTATUS 04/09/2020 FINAL 04/05/2020 1530    REPTSTATUS 04/07/2020 FINAL 04/05/2020 1530    Cardiac Enzymes: No results for input(s): CKTOTAL, CKMB, CKMBINDEX, TROPONINI in the last 168 hours. CBG: Recent Labs  Lab 04/28/20 0557 04/28/20 1107 04/28/20 1610 04/28/20 2140 04/29/20 0627  GLUCAP 102* 107* 213* 186* 103*   Iron Studies: No results for input(s): IRON, TIBC, TRANSFERRIN, FERRITIN in the last 72 hours. @lablastinr3 @ Studies/Results: No results found. Medications: . citrate dextrose    . anticoagulant sodium citrate    . anticoagulant sodium citrate    . calcium gluconate    . citrate dextrose    . famotidine (PEPCID) IV    . sodium chloride     . diphenhydrAMINE      . sodium chloride   Intravenous Once  . amitriptyline  50 mg Oral QHS  . calcitRIOL  0.25 mcg Oral Q M,W,F-HD  . calcium carbonate  2 tablet Oral Q3H  . Chlorhexidine Gluconate Cloth  6 each Topical Q0600  . cholecalciferol  1,000 Units Oral Daily  . darbepoetin (ARANESP) injection - DIALYSIS  100 mcg Intravenous Q Sat-HD  . heparin injection (subcutaneous)  5,000 Units Subcutaneous Q8H  . insulin aspart  0-6 Units Subcutaneous TID WC  . levothyroxine  88 mcg Oral QAC breakfast  . metoprolol succinate  100 mg Oral Daily  . multivitamin  1 tablet Oral QHS  . nystatin  5 mL Oral QID  . pantoprazole  40 mg Oral Daily  . potassium chloride  20 mEq Oral Once  . predniSONE  60 mg Oral Q breakfast  . sodium chloride flush  3 mL Intravenous Once  . sulfamethoxazole-trimethoprim  1 tablet Oral Once per day on Mon Wed Fri

## 2020-04-29 NOTE — Progress Notes (Signed)
PROGRESS NOTE    Jasmine White  ZOX:[id number] DOB: 10-Jun-1946 DOA: 04/16/2020 PCP: Chevis Pretty, FNP    Brief Narrative:  Jasmine White is an 74 y.o. female with recent hospitalization from 6/26-7/20 for AKI due to ANCA positive RPGN now on HD via right PermCath MWF, HTN, hypothyroidism, HLD and osteoarthritis presenting with dyspnea, orthopnea and hemoptysis and admitted for acute respiratory failure with hypoxia likely due to pulmonary edema, possible pneumonia and vasculitis. Appears more related to vasculitis than PNA. PCCM and nephrology following and appreciate their help with this complicated patient.    Assessment & Plan:   Active Problems:   Essential hypertension, benign   Hypothyroidism   Chronic back pain   Hyponatremia   Acute renal failure (HCC)   Acute respiratory failure with hypoxia (HCC)   Hypokalemia   HAP (hospital-acquired pneumonia)   Acute pulmonary edema (HCC)   ANCA-associated vasculitis (HCC)   Acute respiratory failure with hypoxia: resolved Patient represented to the ED with progressive shortness of breath and hemoptysis.  Recently started on dialysis for ANCA vasculitis with concern for now pulmonary involvement.  Was noted to be hypoxic with SPO2 75% requiring NRB.  Chest x-ray notable for multifocal pneumonia with CT chest extensive, dense heterogeneous and consolidative airspace disease bilaterally worsened in comparison with previous exam on 04/05/2020.  Covid-19 PCR negative.  Etiology likely multifactorial with pulmonary edema and pulmonary vasculitis most likely especially with hemoptysis, less likely infectious/pneumonia.  Initially was started on IV ciprofloxacin, cefepime and vancomycin which was eventually discontinued as vasculitis leading etiology. --Continue steroids, plasmapheresis and hemodialysis as below --Was placed on oxygen last night following dialysis, oxygen now titrated off with SPO2 94% on room air. --Chest x-ray  ordered by nephrology this morning, pending  Pulmonary edema with moderate right pleural effusion --Continue HD for volume removal  ANCA positive vasculitis with renal and pulmonary involvement Was previously on Cytoxan.  Received Rituxan on 04/19/2020, next dose 05/03/2020. --Prednisone 60 mg p.o. 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 --Continue HD per nephrology --Patient was to have AVF (placed 7/12 Dr. Donnetta Hutching) evaluated by vascular surgery on 05/01/2020 outpatient, will have vascular surgery evaluate next week.  Hypokalemia K 4.0 today  Anemia of chronic disease Hemoglobin 6.4 today. --tranfused 2u PRBC with HD on 8/7 --ESA and IV iron per nephrology --Hgb 6.4>11.0 today --Continue to closely monitor hemoglobin, transfuse for hemoglobin less than 7.0.  Essential hypertension --Continue home metoprolol succinate 100 mg p.o. daily --Hydralazine as needed --Volume management per HD  Hypothyroidism --Continue levothyroxine 23mcg PO daily  Chronic back pain/mood disorder --Continue amitriptyline 50 mg p.o. nightly  Weakness --PT/OT following with recommendations of HH PT  DVT prophylaxis: Heparin Code Status: Full code Family Communication: Daughter and husband at bedside this afternoon Disposition Plan:  Status is: Inpatient  Remains inpatient appropriate because:Persistent severe electrolyte disturbances, Ongoing diagnostic testing needed not appropriate for outpatient work up, Unsafe d/c plan and IV treatments appropriate due to intensity of illness or inability to take PO   Dispo: The patient is from: Home              Anticipated d/c is to: Home              Anticipated d/c date is: > 3 days   05/03/2020 after Rituxin infusion  Patient currently is not medically stable to d/c.  Consultants:   Nephrology  Pulmonology/critical care medicine - signed off 04/24/2020  Procedures:   Plasmapheresis  Antimicrobials:   Bactrim - continues for PCP prophylaxis  Ciprofloxacin 7/26 -7/28  Cefepime 7/26 - 8/2  Vancomycin 7/26 - 8/2    Subjective: Patient seen and examined bedside, resting comfortably sitting in bedside chair.  No family present.  Hemoglobin up to 11.0 this morning following transfusion yesterday of 2 units.  Was short of breath and placed on supplemental oxygen overnight, now titrated back to room air with good SPO2..  Continues with some fatigue and weakness.  No other specific complaints at this time.  Has completed 5 out of 7 sessions of plasmapheresis with plasmapheresis scheduled for today and next dose of Rituxan on 05/03/2020.  Nuys headache, no chest pain, palpitations, no abdominal pain, no fever/chills/night sweats, no cough/congestion.  No acute events overnight per nursing staff.   Objective: Vitals:   04/29/20 0352 04/29/20 0722 04/29/20 1015 04/29/20 1033  BP: (!) 148/85 (!) 144/74 (!) 153/78 (!) 155/78  Pulse: 86 92 89 89  Resp: 20 20 (!) 35 (!) 35  Temp: (!) 97.5 F (36.4 C) 97.7 F (36.5 C) 97.9 F (36.6 C)   TempSrc: Oral Oral Oral   SpO2: 95% 100% 94% 94%  Weight:   57.7 kg     Intake/Output Summary (Last 24 hours) at 04/29/2020 1103 Last data filed at 04/29/2020 0800 Gross per 24 hour  Intake 720 ml  Output --  Net 720 ml   Filed Weights   04/28/20 0645 04/28/20 1024 04/29/20 1015  Weight: 59.6 kg 56.3 kg 57.7 kg    Examination:  General exam: Appears calm and comfortable  Respiratory system: Clear to auscultation. Respiratory effort normal.  On room air with SPO2 93% Cardiovascular system: S1 & S2 heard, RRR. No JVD, murmurs, rubs, gallops or clicks. No pedal edema.  Right permacath HD noted right chest. Gastrointestinal system: Abdomen is nondistended,  soft and nontender. No organomegaly or masses felt. Normal bowel sounds heard. Central nervous system: Alert and oriented. No focal neurological deficits. Extremities: Symmetric 5 x 5 power.  Skin: No rashes, lesions or ulcers Psychiatry: Judgement and insight appear normal. Mood & affect appropriate.     Data Reviewed: I have personally reviewed following labs and imaging studies  CBC: Recent Labs  Lab 04/24/20 1302 04/24/20 1302 04/25/20 0046 04/26/20 0333 04/28/20 0134 04/29/20 0927 04/29/20 1011  WBC 12.4*  --  10.3 13.0* 10.6* 15.3*  --   HGB 7.3*   < > 7.1* 7.6* 6.4* 11.0* 10.2*  HCT 23.0*   < > 22.3* 23.5* 20.3* 33.3* 30.0*  MCV 96.2  --  94.9 97.9 97.1 92.5  --   PLT 126*  --  113* 156 144* 126*  --    < > = values in this interval not displayed.   Basic Metabolic Panel: Recent Labs  Lab 04/24/20 1303 04/24/20 1303 04/25/20 0046 04/25/20 0046 04/26/20 0333 04/27/20 0905 04/28/20 0134 04/29/20 0927 04/29/20 1011  NA 131*   < > 139   < > 138 136 138 135 143  K 4.2   < > 3.0*   < > 4.3 3.7 3.9 4.0 3.3*  CL 88*   < > 94*   < > 95* 97* 94* 96* 94*  CO2 25   < > 32  --  29 26 27 22   --   GLUCOSE 214*   < >  95   < > 114* 178* 108* 180* 186*  BUN 103*   < > 32*   < > 52* 35* 47* 45* 37*  CREATININE 6.63*   < > 3.07*   < > 5.22* 4.03* 5.21* 4.46* 4.30*  CALCIUM 7.8*   < > 8.4*  --  8.2* 7.9* 8.7* 8.6*  --   MG  --   --   --   --  2.1  --  2.1 2.2  --   PHOS 8.5*  --  4.8*  --   --   --   --   --   --    < > = values in this interval not displayed.   GFR: Estimated Creatinine Clearance: 10.5 mL/min (A) (by C-G formula based on SCr of 4.3 mg/dL (H)). Liver Function Tests: Recent Labs  Lab 04/24/20 1303 04/25/20 0046  ALBUMIN 2.8* 2.9*   No results for input(s): LIPASE, AMYLASE in the last 168 hours. No results for input(s): AMMONIA in the last 168 hours. Coagulation Profile: No results for input(s): INR, PROTIME in the last 168 hours. Cardiac Enzymes: No  results for input(s): CKTOTAL, CKMB, CKMBINDEX, TROPONINI in the last 168 hours. BNP (last 3 results) No results for input(s): PROBNP in the last 8760 hours. HbA1C: No results for input(s): HGBA1C in the last 72 hours. CBG: Recent Labs  Lab 04/28/20 0557 04/28/20 1107 04/28/20 1610 04/28/20 2140 04/29/20 0627  GLUCAP 102* 107* 213* 186* 103*   Lipid Profile: No results for input(s): CHOL, HDL, LDLCALC, TRIG, CHOLHDL, LDLDIRECT in the last 72 hours. Thyroid Function Tests: No results for input(s): TSH, T4TOTAL, FREET4, T3FREE, THYROIDAB in the last 72 hours. Anemia Panel: No results for input(s): VITAMINB12, FOLATE, FERRITIN, TIBC, IRON, RETICCTPCT in the last 72 hours. Sepsis Labs: No results for input(s): PROCALCITON, LATICACIDVEN in the last 168 hours.  No results found for this or any previous visit (from the past 240 hour(s)).       Radiology Studies: No results found.      Scheduled Meds: . diphenhydrAMINE      . sodium chloride   Intravenous Once  . amitriptyline  50 mg Oral QHS  . calcitRIOL  0.25 mcg Oral Q M,W,F-HD  . calcium carbonate  2 tablet Oral Q3H  . Chlorhexidine Gluconate Cloth  6 each Topical Q0600  . cholecalciferol  1,000 Units Oral Daily  . darbepoetin (ARANESP) injection - DIALYSIS  100 mcg Intravenous Q Sat-HD  . heparin injection (subcutaneous)  5,000 Units Subcutaneous Q8H  . insulin aspart  0-6 Units Subcutaneous TID WC  . levothyroxine  88 mcg Oral QAC breakfast  . metoprolol succinate  100 mg Oral Daily  . multivitamin  1 tablet Oral QHS  . nystatin  5 mL Oral QID  . pantoprazole  40 mg Oral Daily  . potassium chloride  20 mEq Oral Once  . predniSONE  60 mg Oral Q breakfast  . sodium chloride flush  3 mL Intravenous Once  . sulfamethoxazole-trimethoprim  1 tablet Oral Once per day on Mon Wed Fri   Continuous Infusions: . citrate dextrose    . anticoagulant sodium citrate    . anticoagulant sodium citrate    . calcium gluconate     . citrate dextrose    . famotidine (PEPCID) IV    . sodium chloride       LOS: 13 days    Time spent: 32 minutes spent on chart review, discussion with nursing staff, consultants,  updating family and interview/physical exam; more than 50% of that time was spent in counseling and/or coordination of care.    Zayleigh Stroh J [place] DO [facility] Available via Epic secure chat 7am-7pm After these hours, please refer to coverage provider listed on [email or web address] 04/29/2020, 11:03 AM

## 2020-04-30 LAB — THERAPEUTIC PLASMA EXCHANGE (BLOOD BANK)
Plasma Exchange: 3000
Plasma volume needed: 3000
Unit division: 0
Unit division: 0
Unit division: 0
Unit division: 0
Unit division: 0
Unit division: 0
Unit division: 0
Unit division: 0

## 2020-04-30 LAB — BASIC METABOLIC PANEL
Anion gap: 17 — ABNORMAL HIGH (ref 5–15)
BUN: 59 mg/dL — ABNORMAL HIGH (ref 8–23)
CO2: 26 mmol/L (ref 22–32)
Calcium: 8.6 mg/dL — ABNORMAL LOW (ref 8.9–10.3)
Chloride: 95 mmol/L — ABNORMAL LOW (ref 98–111)
Creatinine, Ser: 5.59 mg/dL — ABNORMAL HIGH (ref 0.44–1.00)
GFR calc Af Amer: 8 mL/min — ABNORMAL LOW (ref >60)
GFR calc non Af Amer: 7 mL/min — ABNORMAL LOW (ref >60)
Glucose, Bld: 108 mg/dL — ABNORMAL HIGH (ref 70–99)
Potassium: 3.9 mmol/L (ref 3.5–5.1)
Sodium: 138 mmol/L (ref 135–145)

## 2020-04-30 LAB — CBC
HCT: 31.2 % — ABNORMAL LOW (ref 36.0–46.0)
Hemoglobin: 10 g/dL — ABNORMAL LOW (ref 12.0–15.0)
MCH: 29.9 pg (ref 26.0–34.0)
MCHC: 32.1 g/dL (ref 30.0–36.0)
MCV: 93.4 fL (ref 80.0–100.0)
Platelets: 133 10*3/uL — ABNORMAL LOW (ref 150–400)
RBC: 3.34 MIL/uL — ABNORMAL LOW (ref 3.87–5.11)
RDW: 18.1 % — ABNORMAL HIGH (ref 11.5–15.5)
WBC: 12.5 10*3/uL — ABNORMAL HIGH (ref 4.0–10.5)
nRBC: 0 % (ref 0.0–0.2)

## 2020-04-30 LAB — GLUCOSE, CAPILLARY
Glucose-Capillary: 99 mg/dL (ref 70–99)
Glucose-Capillary: 186 mg/dL — ABNORMAL HIGH (ref 70–99)
Glucose-Capillary: 161 mg/dL — ABNORMAL HIGH (ref 70–99)
Glucose-Capillary: 125 mg/dL — ABNORMAL HIGH (ref 70–99)

## 2020-04-30 LAB — MAGNESIUM: Magnesium: 2.1 mg/dL (ref 1.7–2.4)

## 2020-04-30 MED ORDER — WHITE PETROLATUM EX OINT: TOPICAL_OINTMENT | CUTANEOUS | Status: DC | PRN

## 2020-04-30 MED ORDER — CALCITRIOL 0.25 MCG PO CAPS
ORAL_CAPSULE | ORAL | Status: AC
  Filled 2020-04-30: qty 1

## 2020-04-30 NOTE — Progress Notes (Signed)
PT Cancellation Note  Patient Details Name: Jasmine White MRN: [id number] DOB: August 26, 1946   Cancelled Treatment:    Reason Eval/Treat Not Completed: Patient at procedure or test/unavailable. Pt currently off unit for HD. Will check back as able to continue with PT POC.    Thelma Comp 04/30/2020, 7:51 AM   Rolinda Roan, PT, DPT Acute Rehabilitation Services Pager: [phone] Office: [phone]

## 2020-04-30 NOTE — Progress Notes (Signed)
PROGRESS NOTE    Jasmine White  SWN:[id number] DOB: October 12, 1945 DOA: 04/16/2020 PCP: Chevis Pretty, FNP    Brief Narrative:  Jasmine White is an 74 y.o. female with recent hospitalization from 6/26-7/20 for AKI due to ANCA positive RPGN now on HD via right PermCath MWF, HTN, hypothyroidism, HLD and osteoarthritis presenting with dyspnea, orthopnea and hemoptysis and admitted for acute respiratory failure with hypoxia likely due to pulmonary edema, possible pneumonia and vasculitis. Appears more related to vasculitis than PNA. PCCM and nephrology following and appreciate their help with this complicated patient.    Assessment & Plan:   Active Problems:   Essential hypertension, benign   Hypothyroidism   Chronic back pain   Hyponatremia   Acute renal failure (HCC)   Acute respiratory failure with hypoxia (HCC)   Hypokalemia   HAP (hospital-acquired pneumonia)   Acute pulmonary edema (HCC)   ANCA-associated vasculitis (HCC)   Acute respiratory failure with hypoxia: resolved Patient represented to the ED with progressive shortness of breath and hemoptysis.  Recently started on dialysis for ANCA vasculitis with concern for now pulmonary involvement.  Was noted to be hypoxic with SPO2 75% requiring NRB.  Chest x-ray notable for multifocal pneumonia with CT chest extensive, dense heterogeneous and consolidative airspace disease bilaterally worsened in comparison with previous exam on 04/05/2020.  Covid-19 PCR negative.  Etiology likely multifactorial with pulmonary edema and pulmonary vasculitis most likely especially with hemoptysis, less likely infectious/pneumonia.  Initially was started on IV ciprofloxacin, cefepime and vancomycin which was eventually discontinued as vasculitis leading etiology. 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  Received Rituxan on 04/19/2020, next dose 05/03/2020. --Prednisone 60 mg p.o. 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 --Continue HD per nephrology --Patient was to have AVF (placed 7/12 Dr. Donnetta Hutching) evaluated by vascular surgery on 05/01/2020 outpatient, will order Vas duplex U/S and have vascular surgery evaluate this week  Hypokalemia Repleted during hospitalization, K 3.9 today  Anemia of chronic disease Hemoglobin 6.4 today. --tranfused 2u PRBC with HD on 8/7 --ESA and IV iron per nephrology --Hgb 6.4>10.0 today --Continue to closely monitor hemoglobin, transfuse for hemoglobin less than 7.0.  Essential hypertension --Continue home metoprolol succinate 100 mg p.o. daily --Hydralazine as needed --Volume management per HD  Hypothyroidism --Continue levothyroxine 27mcg PO daily  Chronic back pain/mood disorder --Continue amitriptyline 50 mg p.o. nightly  Weakness --PT/OT following with recommendations of HH PT  DVT prophylaxis: Heparin Code Status: Full code Family Communication: No family present at bedside this morning. Disposition Plan:  Status is: Inpatient  Remains inpatient appropriate because:Persistent severe electrolyte disturbances, Ongoing diagnostic testing needed not appropriate for outpatient work up, Unsafe d/c plan and IV treatments appropriate due to intensity of illness or inability to take PO   Dispo: The patient is from: Home              Anticipated d/c is to: Home              Anticipated d/c date is: > 3 days   05/03/2020 after Rituxin infusion  Patient currently is not medically stable  to d/c.  Consultants:   Nephrology  Pulmonology/critical care medicine - signed off 04/24/2020  Procedures:   Plasmapheresis  Antimicrobials:   Bactrim - continues for PCP prophylaxis  Ciprofloxacin 7/26 -7/28  Cefepime 7/26 - 8/2  Vancomycin 7/26 - 8/2    Subjective: Patient seen and examined bedside, resting comfortably sitting in bedside chair.  No family present.  Just returned from hemodialysis.  Has 1 more session of plasmapheresis planned for tomorrow.  Next Rituxan infusion on 05/03/2020. Continues with some fatigue and weakness.  No other specific complaints at this time.  Denies headache, no chest pain, palpitations, no abdominal pain, no fever/chills/night sweats, no cough/congestion.  No acute events overnight per nursing staff.   Objective: Vitals:   04/30/20 1000 04/30/20 1025 04/30/20 1030 04/30/20 1100  BP: 113/74 123/74 126/65 98/73  Pulse:  (!) 104  99  Resp:  (!) 23  20  Temp:   (!) 97.5 F (36.4 C) 97.7 F (36.5 C)  TempSrc:   Oral Oral  SpO2:  98%  94%  Weight:  56.4 kg      Intake/Output Summary (Last 24 hours) at 04/30/2020 1214 Last data filed at 04/30/2020 1025 Gross per 24 hour  Intake 1245 ml  Output 2500 ml  Net -1255 ml   Filed Weights   04/30/20 0407 04/30/20 0646 04/30/20 1025  Weight: 58.9 kg 58.9 kg 56.4 kg    Examination:  General exam: Appears calm and comfortable  Respiratory system: Clear to auscultation. Respiratory effort normal.  On room air with SPO2 97% Cardiovascular system: S1 & S2 heard, RRR. No JVD, murmurs, rubs, gallops or clicks. No pedal edema.  Right permacath HD noted right chest. Gastrointestinal system: Abdomen is nondistended, soft and nontender. No organomegaly or masses felt. Normal bowel sounds heard. Central nervous system: Alert and oriented. No focal neurological deficits. Extremities: Symmetric 5 x 5 power.  Skin: No rashes, lesions or ulcers Psychiatry: Judgement and insight appear normal. Mood &  affect appropriate.     Data Reviewed: I have personally reviewed following labs and imaging studies  CBC: Recent Labs  Lab 04/25/20 0046 04/25/20 0046 04/26/20 0333 04/28/20 0134 04/29/20 0927 04/29/20 1011 04/30/20 0037  WBC 10.3  --  13.0* 10.6* 15.3*  --  12.5*  HGB 7.1*   < > 7.6* 6.4* 11.0* 10.2* 10.0*  HCT 22.3*   < > 23.5* 20.3* 33.3* 30.0* 31.2*  MCV 94.9  --  97.9 97.1 92.5  --  93.4  PLT 113*  --  156 144* 126*  --  133*   < > = values in this interval not displayed.   Basic Metabolic Panel: Recent Labs  Lab 04/24/20 1303 04/24/20 1303 04/25/20 0046 04/25/20 0046 04/26/20 0333 04/26/20 0333 04/27/20 0905 04/28/20 0134 04/29/20 0927 04/29/20 1011 04/30/20 0037  NA 131*   < > 139   < > 138   < > 136 138 135 143 138  K 4.2   < > 3.0*   < > 4.3   < > 3.7 3.9 4.0 3.3* 3.9  CL 88*   < > 94*   < > 95*   < > 97* 94* 96* 94* 95*  CO2 25   < > 32   < > 29  --  26 27 22   --  26  GLUCOSE 214*   < > 95   < > 114*   < > 178* 108* 180* 186* 108*  BUN 103*   < > 32*   < > 52*   < > 35* 47* 45* 37* 59*  CREATININE 6.63*   < > 3.07*   < > 5.22*   < > 4.03* 5.21* 4.46* 4.30* 5.59*  CALCIUM 7.8*   < > 8.4*   < > 8.2*  --  7.9* 8.7* 8.6*  --  8.6*  MG  --   --   --   --  2.1  --   --  2.1 2.2  --  2.1  PHOS 8.5*  --  4.8*  --   --   --   --   --   --   --   --    < > = values in this interval not displayed.   GFR: Estimated Creatinine Clearance: 7.9 mL/min (A) (by C-G formula based on SCr of 5.59 mg/dL (H)). Liver Function Tests: Recent Labs  Lab 04/24/20 1303 04/25/20 0046  ALBUMIN 2.8* 2.9*   No results for input(s): LIPASE, AMYLASE in the last 168 hours. No results for input(s): AMMONIA in the last 168 hours. Coagulation Profile: No results for input(s): INR, PROTIME in the last 168 hours. Cardiac Enzymes: No results for input(s): CKTOTAL, CKMB, CKMBINDEX, TROPONINI in the last 168 hours. BNP (last 3 results) No results for input(s): PROBNP in the last 8760  hours. HbA1C: No results for input(s): HGBA1C in the last 72 hours. CBG: Recent Labs  Lab 04/29/20 0627 04/29/20 1606 04/29/20 2123 04/30/20 0633 04/30/20 1112  GLUCAP 103* 157* 117* 99 186*   Lipid Profile: No results for input(s): CHOL, HDL, LDLCALC, TRIG, CHOLHDL, LDLDIRECT in the last 72 hours. Thyroid Function Tests: No results for input(s): TSH, T4TOTAL, FREET4, T3FREE, THYROIDAB in the last 72 hours. Anemia Panel: No results for input(s): VITAMINB12, FOLATE, FERRITIN, TIBC, IRON, RETICCTPCT in the last 72 hours. Sepsis Labs: No results for input(s): PROCALCITON, LATICACIDVEN in the last 168 hours.  No results found for this or any previous visit (from the past 240 hour(s)).       Radiology Studies: DG CHEST PORT 1 VIEW  Result Date: 04/29/2020 CLINICAL DATA:  Chest pain and shortness of breath.  Hemodialysis. EXAM: PORTABLE CHEST 1 VIEW COMPARISON:  04/19/2020 FINDINGS: RIGHT-sided dialysis catheter tips overlie the level of superior vena cava. Heart size is enlarged and partially obscured. There are bilateral pleural effusions. There has been significant improvement in aeration of the lungs bilaterally. Patchy airspace filling opacities persist throughout the lungs. IMPRESSION: Significant improvement in aeration of the lungs. Small bilateral pleural effusions. Electronically Signed   By: [name] M.D.   On: 04/29/2020 12:15        Scheduled Meds: . sodium chloride   Intravenous Once  . amitriptyline  50 mg Oral QHS  . calcitRIOL      . calcitRIOL  0.25 mcg Oral Q M,W,F-HD  . Chlorhexidine Gluconate Cloth  6 each Topical Q0600  . cholecalciferol  1,000 Units Oral Daily  . darbepoetin (ARANESP) injection - DIALYSIS  100 mcg Intravenous Q Sat-HD  . heparin injection (subcutaneous)  5,000 Units Subcutaneous Q8H  . insulin aspart  0-6 Units Subcutaneous TID WC  . levothyroxine  88 mcg Oral QAC breakfast  . metoprolol succinate  100 mg Oral Daily  .  multivitamin  1 tablet Oral QHS  . nystatin  5 mL Oral QID  . pantoprazole  40 mg Oral Daily  . potassium chloride  20 mEq Oral Once  . predniSONE  60 mg Oral Q breakfast  . sodium chloride flush  3 mL Intravenous Once  . sulfamethoxazole-trimethoprim  1 tablet Oral Once per day on Mon Wed Fri   Continuous Infusions: . anticoagulant sodium citrate    . famotidine (PEPCID) IV    . sodium chloride       LOS: 14 days    Time spent: 32 minutes spent on chart review, discussion with nursing staff, consultants, updating family and interview/physical exam; more than 50% of that time was spent in counseling and/or coordination of care.    Koray Soter J [place] DO [facility] Available via Epic secure chat 7am-7pm After these hours, please refer to coverage provider listed on [email or web address] 04/30/2020, 12:14 PM

## 2020-04-30 NOTE — Progress Notes (Signed)
[facility] Progress Note   Subjective:  Seen on HD - 3L UF goal and tolerating. Ongoing mild dyspnea, no CP.    Objective Vitals:   04/30/20 0700 04/30/20 0730 04/30/20 0800 04/30/20 0830  BP: (!) 144/85 132/68 118/66 127/74  Pulse: 85 99 82   Resp: (!) 25 19 (!) 21   Temp:      TempSrc:      SpO2:  95% 97%   Weight:       Physical Exam General: Well appearing woman, NAD Heart: RRR Lungs: CTA anteriorly, faint LLL rales Abdomen: soft, non-tender Extremities: No LE edema Dialysis Access: L AVF + bruit, TDC in R chest  Additional Objective Labs: Basic Metabolic Panel: Recent Labs  Lab 04/24/20 1303 04/24/20 1303 04/25/20 0046 04/26/20 0333 04/28/20 0134 04/28/20 0134 04/29/20 0927 04/29/20 1011 04/30/20 0037  NA 131*   < > 139   < > 138   < > 135 143 138  K 4.2   < > 3.0*   < > 3.9   < > 4.0 3.3* 3.9  CL 88*   < > 94*   < > 94*   < > 96* 94* 95*  CO2 25   < > 32   < > 27  --  22  --  26  GLUCOSE 214*   < > 95   < > 108*   < > 180* 186* 108*  BUN 103*   < > 32*   < > 47*   < > 45* 37* 59*  CREATININE 6.63*   < > 3.07*   < > 5.21*   < > 4.46* 4.30* 5.59*  CALCIUM 7.8*   < > 8.4*   < > 8.7*  --  8.6*  --  8.6*  PHOS 8.5*  --  4.8*  --   --   --   --   --   --    < > = values in this interval not displayed.   Liver Function Tests: Recent Labs  Lab 04/24/20 1303 04/25/20 0046  ALBUMIN 2.8* 2.9*   CBC: Recent Labs  Lab 04/25/20 0046 04/25/20 0046 04/26/20 0333 04/26/20 0333 04/28/20 0134 04/28/20 0134 04/29/20 0927 04/29/20 1011 04/30/20 0037  WBC 10.3   < > 13.0*   < > 10.6*  --  15.3*  --  12.5*  HGB 7.1*   < > 7.6*   < > 6.4*   < > 11.0* 10.2* 10.0*  HCT 22.3*   < > 23.5*   < > 20.3*   < > 33.3* 30.0* 31.2*  MCV 94.9  --  97.9  --  97.1  --  92.5  --  93.4  PLT 113*   < > 156   < > 144*  --  126*  --  133*   < > = values in this interval not displayed.   CBG: Recent Labs  Lab 04/28/20 2140 04/29/20 0627 04/29/20 1606  04/29/20 2123 04/30/20 0633  GLUCAP 186* 103* 157* 117* 99   Studies/Results: DG CHEST PORT 1 VIEW  Result Date: 04/29/2020 CLINICAL DATA:  Chest pain and shortness of breath.  Hemodialysis. EXAM: PORTABLE CHEST 1 VIEW COMPARISON:  04/19/2020 FINDINGS: RIGHT-sided dialysis catheter tips overlie the level of superior vena cava. Heart size is enlarged and partially obscured. There are bilateral pleural effusions. There has been significant improvement in aeration of the lungs bilaterally. Patchy airspace filling opacities persist throughout the lungs. IMPRESSION: Significant improvement  in aeration of the lungs. Small bilateral pleural effusions. Electronically Signed   By: [name] M.D.   On: 04/29/2020 12:15   Medications: . anticoagulant sodium citrate    . calcium gluconate    . citrate dextrose    . famotidine (PEPCID) IV    . sodium chloride     . sodium chloride   Intravenous Once  . amitriptyline  50 mg Oral QHS  . calcitRIOL  0.25 mcg Oral Q M,W,F-HD  . Chlorhexidine Gluconate Cloth  6 each Topical Q0600  . cholecalciferol  1,000 Units Oral Daily  . darbepoetin (ARANESP) injection - DIALYSIS  100 mcg Intravenous Q Sat-HD  . heparin injection (subcutaneous)  5,000 Units Subcutaneous Q8H  . insulin aspart  0-6 Units Subcutaneous TID WC  . levothyroxine  88 mcg Oral QAC breakfast  . metoprolol succinate  100 mg Oral Daily  . multivitamin  1 tablet Oral QHS  . nystatin  5 mL Oral QID  . pantoprazole  40 mg Oral Daily  . potassium chloride  20 mEq Oral Once  . predniSONE  60 mg Oral Q breakfast  . sodium chloride flush  3 mL Intravenous Once  . sulfamethoxazole-trimethoprim  1 tablet Oral Once per day on Mon Wed Fri    Dialysis Orders: MWF - [place] 3.5h  400/800  2/2.25 bath   61.5kg (lower at dc)  No heparin (due to presumed pulm hemorrhage, was on Hep 1800u) TDC  +LUA AVF maturing  - Mircera 179mcg Q2wks - last 7/23 - Venofer 100mg  qHD x10 - ordered/not started  yet - Calcitriol 0.44mcg qHD  04/19/20 - last CXR diffuse bilat infiltrates, near white-out 04/29/20  CXR - today - almost completely resolved infiltrates  Assessment/Plan: 1. 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 Restarted on plasmapheresis and HD with improvement. PCCM following and holding off on repeat bronch for now. 2. BP/ volume: 3L UFG today - follow. Some dyspnea, CXR much improved from admit. 3. Anti-GBM/RPGN- re-pulsed w/ IV solumedrol and now on prednisone 60 mg daily. Stopped oral cytoxan and gave rituxan on 04/19/20. Due for second dose on 05/03/20.  1. Resumed plasmapheresis and is s/p6/7 treatments -> 05/01/20 will be last Rx 2. HD today and per MWF schedule. 4. Dialysis dependent AKI - remains anuric. Biopsy with significant crescents and dense ATN. Will need to get back on MWF sched as above.  5. Anemia due to [facility] and CKD- Continue Aranesp q Sat.No heparin with HD. S/p 2U PRBCs on 8/7, Hgb 10 today. 6. CKD-MBD- Ca/Phos stable. On PO calcitriol q HD.  Veneta Penton, PA-C 04/30/2020, 8:52 AM  [employer]

## 2020-04-30 NOTE — Plan of Care (Signed)

## 2020-05-01 ENCOUNTER — Encounter (HOSPITAL_COMMUNITY): Payer: Medicare PPO

## 2020-05-01 ENCOUNTER — Inpatient Hospital Stay (HOSPITAL_COMMUNITY): Admit: 2020-05-01 | Payer: Medicare PPO

## 2020-05-01 ENCOUNTER — Inpatient Hospital Stay (HOSPITAL_COMMUNITY): Payer: Medicare PPO

## 2020-05-01 DIAGNOSIS — N179 Acute kidney failure, unspecified: Secondary | ICD-10-CM

## 2020-05-01 LAB — GLUCOSE, CAPILLARY
Glucose-Capillary: 97 mg/dL (ref 70–99)
Glucose-Capillary: 136 mg/dL — ABNORMAL HIGH (ref 70–99)
Glucose-Capillary: 215 mg/dL — ABNORMAL HIGH (ref 70–99)
Glucose-Capillary: 113 mg/dL — ABNORMAL HIGH (ref 70–99)

## 2020-05-01 LAB — BASIC METABOLIC PANEL WITH GFR
Anion gap: 17 — ABNORMAL HIGH (ref 5–15)
BUN: 38 mg/dL — ABNORMAL HIGH (ref 8–23)
CO2: 24 mmol/L (ref 22–32)
Calcium: 8.3 mg/dL — ABNORMAL LOW (ref 8.9–10.3)
Chloride: 96 mmol/L — ABNORMAL LOW (ref 98–111)
Creatinine, Ser: 4.04 mg/dL — ABNORMAL HIGH (ref 0.44–1.00)
GFR calc Af Amer: 12 mL/min — ABNORMAL LOW
GFR calc non Af Amer: 10 mL/min — ABNORMAL LOW
Glucose, Bld: 119 mg/dL — ABNORMAL HIGH (ref 70–99)
Potassium: 3.2 mmol/L — ABNORMAL LOW (ref 3.5–5.1)
Sodium: 137 mmol/L (ref 135–145)

## 2020-05-01 MED ORDER — CALCIUM CARBONATE ANTACID 500 MG PO CHEW: 2.0000 | CHEWABLE_TABLET | ORAL | Status: DC

## 2020-05-01 MED ORDER — CALCIUM CARBONATE ANTACID 500 MG PO CHEW
CHEWABLE_TABLET | ORAL | Status: AC
  Administered 2020-05-01: 400 mg via ORAL
  Filled 2020-05-01: qty 4

## 2020-05-01 MED ORDER — ACETAMINOPHEN 325 MG PO TABS: 650.0000 mg | ORAL_TABLET | ORAL | Status: DC | PRN

## 2020-05-01 MED ORDER — ANTICOAGULANT SODIUM CITRATE 4% (200MG/5ML) IV SOLN
5.0000 mL | Freq: Once | Status: DC
  Filled 2020-05-01: qty 5

## 2020-05-01 MED ORDER — DIPHENHYDRAMINE HCL 25 MG PO CAPS
ORAL_CAPSULE | ORAL | Status: AC
  Administered 2020-05-01: 25 mg via ORAL
  Filled 2020-05-01: qty 1

## 2020-05-01 MED ORDER — ACD FORMULA A 0.73-2.45-2.2 GM/100ML VI SOLN
Status: AC
  Administered 2020-05-01: 1000 mL
  Filled 2020-05-01: qty 500

## 2020-05-01 MED ORDER — DIPHENHYDRAMINE HCL 25 MG PO CAPS: 25.0000 mg | ORAL_CAPSULE | Freq: Four times a day (QID) | ORAL | Status: DC | PRN

## 2020-05-01 MED ORDER — CALCIUM GLUCONATE-NACL 2-0.675 GM/100ML-% IV SOLN
2.0000 g | Freq: Once | INTRAVENOUS | Status: AC
  Administered 2020-05-01: 2000 mg via INTRAVENOUS
  Filled 2020-05-01 (×2): qty 100

## 2020-05-01 MED ORDER — POTASSIUM CHLORIDE CRYS ER 20 MEQ PO TBCR
40.0000 meq | EXTENDED_RELEASE_TABLET | Freq: Once | ORAL | Status: AC
  Administered 2020-05-01: 40 meq via ORAL
  Filled 2020-05-01: qty 2

## 2020-05-01 MED ORDER — ACD FORMULA A 0.73-2.45-2.2 GM/100ML VI SOLN
1000.0000 mL | Status: DC
  Filled 2020-05-01: qty 1000

## 2020-05-01 NOTE — TOC Initial Note (Signed)
Transition of Care [facility]) - Initial/Assessment Note    Patient Details  Name: Jasmine White MRN: [id number] Date of Birth: May 23, 1946  [facility] CM/SW Contact:    Carles Collet, RN Phone Number: 05/01/2020, 11:50 AM  Clinical Narrative:            Damaris Schooner w patient's daughter, she would like to use [facility], and for ptaient to return home [facility] requested doppler of graft be done in hospital, and for nocturnal oxygen test to be completed as well. These requests were passed on to Dr [place]  CM will continue to follow        Expected Discharge Plan: [place] Barriers to Discharge: Continued Medical Work up   Patient Goals and CMS Choice Patient states their goals for this hospitalization and ongoing recovery are:: to go home CMS Medicare.gov Compare Post Acute Care list provided to:: Legal Guardian Choice offered to / list presented to : Adult Children  Expected Discharge Plan and Services Expected Discharge Plan: [place]   Discharge Planning Services: CM Consult                                          Prior Living Arrangements/Services                       Activities of Daily Living Home Assistive Devices/Equipment: None ADL Screening (condition at time of admission) Patient's cognitive ability adequate to safely complete daily activities?: Yes Is the patient deaf or have difficulty hearing?: No Does the patient have difficulty seeing, even when wearing glasses/contacts?: No Does the patient have difficulty concentrating, remembering, or making decisions?: Yes Patient able to express need for assistance with ADLs?: Yes Does the patient have difficulty dressing or bathing?: No Independently performs ADLs?: No Communication: Independent Dressing (OT): Independent Grooming: Independent Feeding: Independent Bathing: Needs assistance Is this a change from baseline?: Pre-admission baseline Toileting:  Independent In/Out Bed: Independent Walks in Home: Independent with device (comment) Does the patient have difficulty walking or climbing stairs?: No Weakness of Legs: None Weakness of Arms/Hands: None  Permission Sought/Granted                  Emotional Assessment              Admission diagnosis:  Shortness of breath [R06.02] Hypoxia [R09.02] Healthcare-associated pneumonia [J18.9] Acute respiratory failure with hypoxia ([place]) [J96.01] Patient Active Problem List   Diagnosis Date Noted  . ANCA-associated vasculitis ([place])   . HAP (hospital-acquired pneumonia)   . Acute pulmonary edema (HCC)   . Hypokalemia 04/16/2020  . Acute respiratory failure with hypoxia ([place]) 04/05/2020  . Acute renal failure ([place])   . Intractable vomiting   . Hyponatremia 03/17/2020  . Primary insomnia 01/20/2019  . BMI 27.0-27.9,adult 06/29/2015  . Chronic back pain 07/06/2014  . Essential hypertension, benign 12/21/2012  . Hyperlipidemia with target LDL less than 130 12/21/2012  . Hypothyroidism 12/21/2012  . Depression 12/21/2012   PCP:  Chevis Pretty, FNP Pharmacy:   [facility], [address] [place] [address] Phone: [phone] Fax: [phone]  [facility] [place], [place] [address] Phone: [phone] Fax: [phone]  [id number] - [place], [place] - [phone]  [place] [address] Phone: [phone] Fax: [phone]  [place], [place] - [place] [address] Phone: [phone] Fax: [phone]     Social Determinants of Health (SDOH) Interventions    Readmission Risk Interventions No flowsheet data found.

## 2020-05-01 NOTE — Progress Notes (Signed)
PT Cancellation Note  Patient Details Name: Jasmine White MRN: [id number] DOB: March 07, 1946   Cancelled Treatment:    Reason Eval/Treat Not Completed: Patient at procedure or test/unavailable Pt off the floor at HD. Will follow.   Marguarite Arbour A Trustin Chapa 05/01/2020, 9:33 AM Marisa Severin, PT, DPT Acute Rehabilitation Services Pager [phone] Office [phone]

## 2020-05-01 NOTE — Progress Notes (Signed)
PROGRESS NOTE    MERL GUARDINO  AOZ:[id number] DOB: 05-25-1946 DOA: 04/16/2020 PCP: Chevis Pretty, FNP    Brief Narrative:  Jasmine White is an 74 y.o. female with recent hospitalization from 6/26-7/20 for AKI due to ANCA positive RPGN now on HD via right PermCath MWF, HTN, hypothyroidism, HLD and osteoarthritis presenting with dyspnea, orthopnea and hemoptysis and admitted for acute respiratory failure with hypoxia likely due to pulmonary edema, possible pneumonia and vasculitis. Appears more related to vasculitis than PNA. PCCM and nephrology following and appreciate their help with this complicated patient.    Assessment & Plan:   Active Problems:   Essential hypertension, benign   Hypothyroidism   Chronic back pain   Hyponatremia   Acute renal failure (HCC)   Acute respiratory failure with hypoxia (HCC)   Hypokalemia   HAP (hospital-acquired pneumonia)   Acute pulmonary edema (HCC)   ANCA-associated vasculitis (HCC)   Acute respiratory failure with hypoxia: resolved Patient represented to the ED with progressive shortness of breath and hemoptysis.  Recently started on dialysis for ANCA vasculitis with concern for now pulmonary involvement.  Was noted to be hypoxic with SPO2 75% requiring NRB.  Chest x-ray notable for multifocal pneumonia with CT chest extensive, dense heterogeneous and consolidative airspace disease bilaterally worsened in comparison with previous exam on 04/05/2020.  Covid-19 PCR negative.  Etiology likely multifactorial with pulmonary edema and pulmonary vasculitis most likely especially with hemoptysis, less likely infectious/pneumonia.  Initially was started on IV ciprofloxacin, cefepime and vancomycin which was eventually discontinued as vasculitis leading etiology. 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  Started on Rituxan.  Completed 7 treatments of plasmapheresis on 05/01/2020. --Prednisone 60 mg p.o. daily --To receive second dose of Rituxan 05/03/2020. 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 --Continue HD per nephrology --Patient was to have AVF (placed 7/12 Dr. Donnetta Hutching) evaluated by vascular surgery on 05/01/2020 outpatient, will order Vas duplex U/S.  Hypokalemia Repleted during hospitalization.  Anemia of chronic disease Hemoglobin 6.4 today. --tranfused 2u PRBC with HD on 8/7 --ESA and IV iron per nephrology --Hgb 6.4>10.0 8/9 --Continue to closely monitor hemoglobin, transfuse for hemoglobin less than 7.0.  Essential hypertension --Continue home metoprolol succinate 100 mg p.o. daily --Hydralazine as needed --Volume management per HD  Hypothyroidism --Continue levothyroxine 61mcg PO daily  Chronic back pain/mood disorder --Continue amitriptyline 50 mg p.o. nightly  Weakness --PT/OT following with recommendations of HH PT  DVT prophylaxis: Heparin Code Status: Full code Family Communication: No family present at bedside this morning. Disposition Plan:  Status is: Inpatient  Remains inpatient appropriate because:Persistent severe electrolyte disturbances, Ongoing diagnostic testing needed not appropriate for outpatient work up, Unsafe d/c plan and IV treatments appropriate due to intensity of illness or inability to take PO   Dispo: The patient is from: Home              Anticipated d/c is to: Home              Anticipated d/c date is: 2 days   05/03/2020 after Rituxin infusion              Patient currently is not medically stable to d/c.  Consultants:   Nephrology  Pulmonology/critical care medicine - signed  off 04/24/2020  Procedures:   Plasmapheresis  Antimicrobials:   Bactrim - continues for PCP prophylaxis  Ciprofloxacin 7/26 -7/28  Cefepime 7/26 - 8/2  Vancomycin 7/26 - 8/2    Subjective: Patient seen and examined bedside, resting comfortably sitting in bedside chair.  Completed course of plasmapheresis today.  Rituxan planned on 05/03/2020 and can discharge home thereafter per nephrology.  Ordered vascular duplex ultrasound to assess left AV fistula per family request as this was supposed to be done outpatient today.  Continues with some fatigue and weakness.  No other specific complaints at this time.  Denies headache, no chest pain, palpitations, no abdominal pain, no fever/chills/night sweats, no cough/congestion.  No acute events overnight per nursing staff.   Objective: Vitals:   05/01/20 1022 05/01/20 1036 05/01/20 1050 05/01/20 1116  BP: (!) 157/81 140/83 (!) 151/77 (!) 156/79  Pulse: (!) 109 (!) 110 (!) 101 (!) 103  Resp: (!) 36 (!) 22 (!) 32 20  Temp: 98.2 F (36.8 C) 98.2 F (36.8 C) (!) 97.4 F (36.3 C) 98.1 F (36.7 C)  TempSrc:    Oral  SpO2: 97% 97% 96% 97%  Weight:        Intake/Output Summary (Last 24 hours) at 05/01/2020 1606 Last data filed at 05/01/2020 1500 Gross per 24 hour  Intake 312 ml  Output --  Net 312 ml   Filed Weights   04/30/20 0646 04/30/20 1025 05/01/20 0550  Weight: 58.9 kg 56.4 kg 57.2 kg    Examination:  General exam: Appears calm and comfortable  Respiratory system: Clear to auscultation. Respiratory effort normal.  On room air with SPO2 97% Cardiovascular system: S1 & S2 heard, RRR. No JVD, murmurs, rubs, gallops or clicks. No pedal edema.  Right permacath HD noted right chest. Gastrointestinal system: Abdomen is nondistended, soft and nontender. No organomegaly or masses felt. Normal bowel sounds heard. Central nervous system: Alert and oriented. No focal neurological deficits. Extremities: Symmetric 5 x 5 power.  Skin: No  rashes, lesions or ulcers Psychiatry: Judgement and insight appear normal. Mood & affect appropriate.     Data Reviewed: I have personally reviewed following labs and imaging studies  CBC: Recent Labs  Lab 04/25/20 0046 04/25/20 0046 04/26/20 0333 04/28/20 0134 04/29/20 0927 04/29/20 1011 04/30/20 0037  WBC 10.3  --  13.0* 10.6* 15.3*  --  12.5*  HGB 7.1*   < > 7.6* 6.4* 11.0* 10.2* 10.0*  HCT 22.3*   < > 23.5* 20.3* 33.3* 30.0* 31.2*  MCV 94.9  --  97.9 97.1 92.5  --  93.4  PLT 113*  --  156 144* 126*  --  133*   < > = values in this interval not displayed.   Basic Metabolic Panel: Recent Labs  Lab 04/25/20 0046 04/25/20 0046 04/26/20 0333 04/26/20 0333 04/27/20 0905 04/27/20 0905 04/28/20 0134 04/29/20 0927 04/29/20 1011 04/30/20 0037 05/01/20 0647  NA 139   < > 138   < > 136   < > 138 135 143 138 137  K 3.0*   < > 4.3   < > 3.7   < > 3.9 4.0 3.3* 3.9 3.2*  CL 94*   < > 95*   < > 97*   < > 94* 96* 94* 95* 96*  CO2 32   < > 29   < > 26  --  27 22  --  26 24  GLUCOSE 95   < >  114*   < > 178*   < > 108* 180* 186* 108* 119*  BUN 32*   < > 52*   < > 35*   < > 47* 45* 37* 59* 38*  CREATININE 3.07*   < > 5.22*   < > 4.03*   < > 5.21* 4.46* 4.30* 5.59* 4.04*  CALCIUM 8.4*   < > 8.2*   < > 7.9*  --  8.7* 8.6*  --  8.6* 8.3*  MG  --   --  2.1  --   --   --  2.1 2.2  --  2.1  --   PHOS 4.8*  --   --   --   --   --   --   --   --   --   --    < > = values in this interval not displayed.   GFR: Estimated Creatinine Clearance: 11 mL/min (A) (by C-G formula based on SCr of 4.04 mg/dL (H)). Liver Function Tests: Recent Labs  Lab 04/25/20 0046  ALBUMIN 2.9*   No results for input(s): LIPASE, AMYLASE in the last 168 hours. No results for input(s): AMMONIA in the last 168 hours. Coagulation Profile: No results for input(s): INR, PROTIME in the last 168 hours. Cardiac Enzymes: No results for input(s): CKTOTAL, CKMB, CKMBINDEX, TROPONINI in the last 168 hours. BNP (last 3  results) No results for input(s): PROBNP in the last 8760 hours. HbA1C: No results for input(s): HGBA1C in the last 72 hours. CBG: Recent Labs  Lab 04/30/20 1112 04/30/20 1632 04/30/20 2103 05/01/20 0606 05/01/20 1128  GLUCAP 186* 161* 125* 97 136*   Lipid Profile: No results for input(s): CHOL, HDL, LDLCALC, TRIG, CHOLHDL, LDLDIRECT in the last 72 hours. Thyroid Function Tests: No results for input(s): TSH, T4TOTAL, FREET4, T3FREE, THYROIDAB in the last 72 hours. Anemia Panel: No results for input(s): VITAMINB12, FOLATE, FERRITIN, TIBC, IRON, RETICCTPCT in the last 72 hours. Sepsis Labs: No results for input(s): PROCALCITON, LATICACIDVEN in the last 168 hours.  No results found for this or any previous visit (from the past 240 hour(s)).       Radiology Studies: VAS US DUPLEX DIALYSIS ACCESS (AVF, AVG)  Result Date: 05/01/2020 DIALYSIS ACCESS Reason for Exam: Routine follow up. Access Site: Left Upper Extremity. Access Type: Brachial-cephalic AVF. History: Left brachial-cephalic AVF placed 03/18/349. Comparison Study: No prior study Performing Technologist: Maudry Mayhew MHA, RDMS, RVT, RDCS  Examination Guidelines: A complete evaluation includes B-mode imaging, spectral Doppler, color Doppler, and power Doppler as needed of all accessible portions of each vessel. Unilateral testing is considered an integral part of a complete examination. Limited examinations for reoccurring indications may be performed as noted.  Findings: +--------------------+----------+-----------------+--------+ AVF                 PSV (cm/s)Flow Vol (mL/min)Comments +--------------------+----------+-----------------+--------+ Native artery inflow   113           361                +--------------------+----------+-----------------+--------+ AVF Anastomosis        192                              +--------------------+----------+-----------------+--------+   +------------+---------+-------------+---------+------------------------------+ OUTFLOW VEIN   PSV   Diameter (cm)  Depth             Describe                         (  cm/s)                 (cm)                                  +------------+---------+-------------+---------+------------------------------+ Prox UA        82        0.41       0.66                                  +------------+---------+-------------+---------+------------------------------+ Mid UA         80        0.43       0.42          competing branch        +------------+---------+-------------+---------+------------------------------+ Dist UA        670       0.10       0.43      narrowing with possible                                                           thrombus            +------------+---------+-------------+---------+------------------------------+ AC Fossa       140       0.49       0.24                                  +------------+---------+-------------+---------+------------------------------+   Summary: Arteriovenous fistula- Focal elevated velocities in the distal upper arm with possible thrombus. *See table(s) above for measurements and observations.   --------------------------------------------------------------------------------   Preliminary         Scheduled Meds: . sodium chloride   Intravenous Once  . amitriptyline  50 mg Oral QHS  . calcitRIOL  0.25 mcg Oral Q M,W,F-HD  . Chlorhexidine Gluconate Cloth  6 each Topical Q0600  . cholecalciferol  1,000 Units Oral Daily  . darbepoetin (ARANESP) injection - DIALYSIS  100 mcg Intravenous Q Sat-HD  . heparin injection (subcutaneous)  5,000 Units Subcutaneous Q8H  . insulin aspart  0-6 Units Subcutaneous TID WC  . levothyroxine  88 mcg Oral QAC breakfast  . metoprolol succinate  100 mg Oral Daily  . multivitamin  1 tablet Oral QHS  . nystatin  5 mL Oral QID  . pantoprazole  40 mg Oral Daily  . predniSONE  60 mg  Oral Q breakfast  . sodium chloride flush  3 mL Intravenous Once  . sulfamethoxazole-trimethoprim  1 tablet Oral Once per day on Mon Wed Fri   Continuous Infusions: . anticoagulant sodium citrate    . famotidine (PEPCID) IV    . sodium chloride       LOS: 15 days    Time spent: 32 minutes spent on chart review, discussion with nursing staff, consultants, updating family and interview/physical exam; more than 50% of that time was spent in counseling and/or coordination of care.    Jebadiah Imperato J [place] DO [facility] Available via Epic secure chat 7am-7pm After these hours, please refer to coverage provider listed on [email or web address] 05/01/2020, 4:06 PM

## 2020-05-01 NOTE — Plan of Care (Signed)
  Problem: Education: Goal: Knowledge of General Education information will improve Description: Including pain rating scale, medication(s)/side effects and non-pharmacologic comfort measures Outcome: Progressing   Problem: Health Behavior/Discharge Planning: Goal: Ability to manage health-related needs will improve Outcome: Progressing   Problem: Clinical Measurements: Goal: Ability to maintain clinical measurements within normal limits will improve Outcome: Progressing Goal: Will remain free from infection Outcome: Progressing Goal: Respiratory complications will improve Outcome: Progressing   Problem: Activity: Goal: Risk for activity intolerance will decrease Outcome: Progressing   Problem: Nutrition: Goal: Adequate nutrition will be maintained Outcome: Progressing   Problem: Coping: Goal: Level of anxiety will decrease Outcome: Progressing   

## 2020-05-01 NOTE — Progress Notes (Signed)
AVF duplex completed. Refer to "CV Proc" under chart review to view preliminary results.  05/01/2020 1:40 PM Kelby Aline., MHA, RVT, RDCS, RDMS

## 2020-05-01 NOTE — Plan of Care (Signed)
  Problem: Education: Goal: Knowledge of General Education information will improve Description: Including pain rating scale, medication(s)/side effects and non-pharmacologic comfort measures Outcome: Progressing   Problem: Health Behavior/Discharge Planning: Goal: Ability to manage health-related needs will improve Outcome: Progressing   Problem: Clinical Measurements: Goal: Ability to maintain clinical measurements within normal limits will improve Outcome: Progressing   Problem: Clinical Measurements: Goal: Diagnostic test results will improve Outcome: Progressing   Problem: Clinical Measurements: Goal: Respiratory complications will improve Outcome: Progressing   Problem: Activity: Goal: Risk for activity intolerance will decrease Outcome: Progressing   Problem: Nutrition: Goal: Adequate nutrition will be maintained Outcome: Progressing   Problem: Coping: Goal: Level of anxiety will decrease Outcome: Progressing   Problem: Pain Managment: Goal: General experience of comfort will improve Outcome: Progressing   

## 2020-05-01 NOTE — Progress Notes (Signed)
[facility] Progress Note   Subjective:  Seen on PE this am, no futher SOB, slept w/o O2 last night.   Objective Vitals:   05/01/20 0917 05/01/20 0935 05/01/20 0950 05/01/20 1005  BP: (!) 155/89 (!) 151/92    Pulse: (!) 110 (!) 111    Resp: (!) 39 (!) 37    Temp: 98.2 F (36.8 C) 98.2 F (36.8 C) 98.2 F (36.8 C) 98.2 F (36.8 C)  TempSrc:      SpO2: 97% 95%    Weight:       Physical Exam General: Well appearing woman, NAD Heart: RRR Lungs: CTA anteriorly, occ L rales o/w clear Abdomen: soft, non-tender Extremities: No LE edema Dialysis Access: L AVF + bruit, TDC in R chest  Additional Objective Labs: Basic Metabolic Panel: Recent Labs  Lab 04/24/20 1303 04/24/20 1303 04/25/20 0046 04/26/20 0333 04/29/20 0927 04/29/20 0927 04/29/20 1011 04/30/20 0037 05/01/20 0647  NA 131*   < > 139   < > 135   < > 143 138 137  K 4.2   < > 3.0*   < > 4.0   < > 3.3* 3.9 3.2*  CL 88*   < > 94*   < > 96*   < > 94* 95* 96*  CO2 25   < > 32   < > 22  --   --  26 24  GLUCOSE 214*   < > 95   < > 180*   < > 186* 108* 119*  BUN 103*   < > 32*   < > 45*   < > 37* 59* 38*  CREATININE 6.63*   < > 3.07*   < > 4.46*   < > 4.30* 5.59* 4.04*  CALCIUM 7.8*   < > 8.4*   < > 8.6*  --   --  8.6* 8.3*  PHOS 8.5*  --  4.8*  --   --   --   --   --   --    < > = values in this interval not displayed.   Liver Function Tests: Recent Labs  Lab 04/24/20 1303 04/25/20 0046  ALBUMIN 2.8* 2.9*   CBC: Recent Labs  Lab 04/25/20 0046 04/25/20 0046 04/26/20 0333 04/26/20 0333 04/28/20 0134 04/28/20 0134 04/29/20 0927 04/29/20 1011 04/30/20 0037  WBC 10.3   < > 13.0*   < > 10.6*  --  15.3*  --  12.5*  HGB 7.1*   < > 7.6*   < > 6.4*   < > 11.0* 10.2* 10.0*  HCT 22.3*   < > 23.5*   < > 20.3*   < > 33.3* 30.0* 31.2*  MCV 94.9  --  97.9  --  97.1  --  92.5  --  93.4  PLT 113*   < > 156   < > 144*  --  126*  --  133*   < > = values in this interval not displayed.   CBG: Recent  Labs  Lab 04/30/20 0633 04/30/20 1112 04/30/20 1632 04/30/20 2103 05/01/20 0606  GLUCAP 99 186* 161* 125* 97   Studies/Results: No results found. Medications: . anticoagulant sodium citrate    . anticoagulant sodium citrate    . calcium gluconate 2,000 mg (05/01/20 0835)  . citrate dextrose    . citrate dextrose    . famotidine (PEPCID) IV    . sodium chloride     . sodium chloride   Intravenous Once  .  amitriptyline  50 mg Oral QHS  . calcitRIOL  0.25 mcg Oral Q M,W,F-HD  . calcium carbonate  2 tablet Oral Q3H  . Chlorhexidine Gluconate Cloth  6 each Topical Q0600  . cholecalciferol  1,000 Units Oral Daily  . darbepoetin (ARANESP) injection - DIALYSIS  100 mcg Intravenous Q Sat-HD  . heparin injection (subcutaneous)  5,000 Units Subcutaneous Q8H  . insulin aspart  0-6 Units Subcutaneous TID WC  . levothyroxine  88 mcg Oral QAC breakfast  . metoprolol succinate  100 mg Oral Daily  . multivitamin  1 tablet Oral QHS  . nystatin  5 mL Oral QID  . pantoprazole  40 mg Oral Daily  . potassium chloride  20 mEq Oral Once  . predniSONE  60 mg Oral Q breakfast  . sodium chloride flush  3 mL Intravenous Once  . sulfamethoxazole-trimethoprim  1 tablet Oral Once per day on Mon Wed Fri    Dialysis Orders: MWF - [place] 3.5h  400/800  2/2.25 bath   61.5kg (lower at dc)  No heparin (due to presumed pulm hemorrhage, was on Hep 1800u) TDC  +LUA AVF maturing  - Mircera 164mcg Q2wks - last 7/23 - Venofer 100mg  qHD x10 - ordered/not started yet - Calcitriol 0.17mcg qHD  04/19/20 - last CXR diffuse bilat infiltrates, near white-out 04/29/20  CXR - today - almost completely resolved infiltrates  Assessment/Plan: 1. 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 Restarted on plasmapheresis and HD with improvement. PCCM following and did not feel bronch was indicated. F/U CXR very sig improvement on 8/8.  2. BP/ volume: got down to  56kg after HD yest, will lower edw on discharge.  3. Anti-GBM/RPGN- re-pulsed w/ IV solumedrol and now on prednisone 60 mg daily. Stopped oral cytoxan and gave rituxan on 04/19/20 - Due for second Rituxan dose on 05/03/20 - Resumed plasmapheresis, s/p6/7 treatments -> today is last Rx - should be ready for dc after 2nd Rituxan dose 4. Dialysis dependent AKI - remains anuric. Biopsy with significant crescents and dense ATN. Probably is ESRD. HD MWF.  HD Wed. 5. Anemia due to [facility] and CKD- Continue Aranesp q Sat.No heparin with HD. S/p 2U PRBCs on 8/7, Hgb 10 today. 6. CKD-MBD- Ca/Phos stable. On PO calcitriol q HD.  Kelly Splinter, MD 05/01/2020, 10:34 AM

## 2020-05-01 NOTE — Progress Notes (Signed)
Physical Therapy Treatment Patient Details Name: Jasmine White MRN: [id number] DOB: July 25, 1946 Today's Date: 05/01/2020    History of Present Illness 74 y.o. female with recent hospitalization from 6/26-7/20 for AKI due to ANCA positive RPGN now on HD via right PermCath MWF, HTN, hypothyroidism, HLD and osteoarthritis presenting with dyspnea, orthopnea and hemoptysis and admitted for acute respiratory failure with hypoxia likely due to pulmonary edema, possible pneumonia and vasculitis.     PT Comments    Patient progressing well towards PT goals. Reports no pain today and breathing appears better. Tolerated ambulation with use of rollator on RA and able to maintain Sp02 >95% throughout. HR ranged from 100-120 bpm with 2/4 DOE. Pt reports hx of panic attacks. Reviewed energy conservation techniques and importance of using rollator as needed as well as short bouts of activity with longer rest breaks. Pt very active PTA, walking miles, riding bikes and going to the [facility] lifting weights. Would benefit from higher level balance activities next session to prepare for d/c home. Will follow.    Follow Up Recommendations  Supervision - Intermittent;Outpatient PT (vs no follow up pending progress)     Equipment Recommendations  None recommended by PT    Recommendations for Other Services       Precautions / Restrictions Precautions Precautions: Fall Restrictions Weight Bearing Restrictions: No    Mobility  Bed Mobility               General bed mobility comments: Up in chair upon PT arrival.  Transfers Overall transfer level: Modified independent Equipment used: 4-wheeled walker Transfers: Sit to/from Stand Sit to Stand: Modified independent (Device/Increase time)         General transfer comment: Modified Independent, no safety concerns.  Ambulation/Gait Ambulation/Gait assistance: Supervision;Modified independent (Device/Increase time) Gait Distance (Feet): 500  Feet Assistive device: 4-wheeled walker Gait Pattern/deviations: WFL(Within Functional Limits) Gait velocity: functional Gait velocity interpretation: 1.31 - 2.62 ft/sec, indicative of limited community ambulator General Gait Details: Steady gait using rollator for support; trunk flexion likely due to rollator height. 2/4 DOE but VSS on RA. Sp02 remained >96%  and HR ranged from 100-120 bpm   Stairs             Wheelchair Mobility    Modified Rankin (Stroke Patients Only)       Balance Overall balance assessment: No apparent balance deficits (not formally assessed)                                          Cognition Arousal/Alertness: Awake/alert Behavior During Therapy: WFL for tasks assessed/performed Overall Cognitive Status: Within Functional Limits for tasks assessed                                        Exercises      General Comments General comments (skin integrity, edema, etc.): Needs further assessment of higher level balance.      Pertinent Vitals/Pain Pain Assessment: No/denies pain    Home Living                      Prior Function            PT Goals (current goals can now be found in the care plan section) Progress towards PT goals: Progressing toward  goals    Frequency    Min 3X/week      PT Plan Discharge plan needs to be updated    Co-evaluation              AM-PAC PT "6 Clicks" Mobility   Outcome Measure  Help needed turning from your back to your side while in a flat bed without using bedrails?: None Help needed moving from lying on your back to sitting on the side of a flat bed without using bedrails?: None Help needed moving to and from a bed to a chair (including a wheelchair)?: None Help needed standing up from a chair using your arms (e.g., wheelchair or bedside chair)?: None Help needed to walk in hospital room?: None Help needed climbing 3-5 steps with a railing? : A  Little 6 Click Score: 23    End of Session Equipment Utilized During Treatment: Gait belt Activity Tolerance: Patient tolerated treatment well Patient left: in bed;with call bell/phone within reach Nurse Communication: Mobility status PT Visit Diagnosis: Other abnormalities of gait and mobility (R26.89)     Time: 1146-1200 PT Time Calculation (min) (ACUTE ONLY): 14 min  Charges:  $Therapeutic Exercise: 8-22 mins                     Marisa Severin, PT, DPT Acute Rehabilitation Services Pager [phone] Office May 05/01/2020, 12:38 PM

## 2020-05-02 DIAGNOSIS — N179 Acute kidney failure, unspecified: Secondary | ICD-10-CM

## 2020-05-02 LAB — THERAPEUTIC PLASMA EXCHANGE (BLOOD BANK)
Plasma Exchange: 3000
Plasma volume needed: 3000
Unit division: 0
Unit division: 0
Unit division: 0
Unit division: 0
Unit division: 0
Unit division: 0
Unit division: 0
Unit division: 0
Unit division: 0
Unit division: 0

## 2020-05-02 LAB — GLUCOSE, CAPILLARY
Glucose-Capillary: 82 mg/dL (ref 70–99)
Glucose-Capillary: 100 mg/dL — ABNORMAL HIGH (ref 70–99)
Glucose-Capillary: 213 mg/dL — ABNORMAL HIGH (ref 70–99)
Glucose-Capillary: 147 mg/dL — ABNORMAL HIGH (ref 70–99)

## 2020-05-02 LAB — CBC
HCT: 31.1 % — ABNORMAL LOW (ref 36.0–46.0)
Hemoglobin: 9.8 g/dL — ABNORMAL LOW (ref 12.0–15.0)
MCH: 30.8 pg (ref 26.0–34.0)
MCHC: 31.5 g/dL (ref 30.0–36.0)
MCV: 97.8 fL (ref 80.0–100.0)
Platelets: 141 10*3/uL — ABNORMAL LOW (ref 150–400)
RBC: 3.18 MIL/uL — ABNORMAL LOW (ref 3.87–5.11)
RDW: 18.2 % — ABNORMAL HIGH (ref 11.5–15.5)
WBC: 9 10*3/uL (ref 4.0–10.5)
nRBC: 0 % (ref 0.0–0.2)

## 2020-05-02 LAB — BASIC METABOLIC PANEL
Anion gap: 14 (ref 5–15)
BUN: 61 mg/dL — ABNORMAL HIGH (ref 8–23)
CO2: 28 mmol/L (ref 22–32)
Calcium: 8.3 mg/dL — ABNORMAL LOW (ref 8.9–10.3)
Chloride: 95 mmol/L — ABNORMAL LOW (ref 98–111)
Creatinine, Ser: 5.83 mg/dL — ABNORMAL HIGH (ref 0.44–1.00)
GFR calc Af Amer: 8 mL/min — ABNORMAL LOW (ref >60)
GFR calc non Af Amer: 7 mL/min — ABNORMAL LOW (ref >60)
Glucose, Bld: 96 mg/dL (ref 70–99)
Potassium: 3.9 mmol/L (ref 3.5–5.1)
Sodium: 137 mmol/L (ref 135–145)

## 2020-05-02 LAB — MAGNESIUM: Magnesium: 2.1 mg/dL (ref 1.7–2.4)

## 2020-05-02 MED ORDER — SODIUM CHLORIDE 0.9 % IV SOLN
1000.0000 mg | Freq: Once | INTRAVENOUS | Status: AC
  Administered 2020-05-03: 1000 mg via INTRAVENOUS
  Filled 2020-05-02: qty 100

## 2020-05-02 MED ORDER — SODIUM CHLORIDE 0.9 % IV BOLUS: 1000.0000 mL | Freq: Once | INTRAVENOUS | Status: DC | PRN

## 2020-05-02 MED ORDER — SODIUM CHLORIDE 0.9 % IV SOLN: 1000.0000 mg | Freq: Once | INTRAVENOUS | Status: DC

## 2020-05-02 MED ORDER — FAMOTIDINE IN NACL 20-0.9 MG/50ML-% IV SOLN
20.0000 mg | Freq: Once | INTRAVENOUS | Status: DC | PRN
  Filled 2020-05-02: qty 50

## 2020-05-02 MED ORDER — DIPHENHYDRAMINE HCL 25 MG PO CAPS
50.0000 mg | ORAL_CAPSULE | Freq: Once | ORAL | Status: AC
  Administered 2020-05-03: 50 mg via ORAL
  Filled 2020-05-02: qty 2

## 2020-05-02 MED ORDER — ACETAMINOPHEN 325 MG PO TABS
650.0000 mg | ORAL_TABLET | Freq: Once | ORAL | Status: AC
  Administered 2020-05-03: 650 mg via ORAL
  Filled 2020-05-02: qty 2

## 2020-05-02 MED ORDER — ALBUTEROL SULFATE (2.5 MG/3ML) 0.083% IN NEBU: 2.5000 mg | INHALATION_SOLUTION | Freq: Once | RESPIRATORY_TRACT | Status: DC | PRN

## 2020-05-02 MED ORDER — CALCITRIOL 0.25 MCG PO CAPS
ORAL_CAPSULE | ORAL | Status: AC
  Administered 2020-05-02: 0.25 ug via ORAL
  Filled 2020-05-02: qty 1

## 2020-05-02 MED ORDER — METHYLPREDNISOLONE SODIUM SUCC 125 MG IJ SOLR: 125.0000 mg | Freq: Once | INTRAMUSCULAR | Status: DC | PRN

## 2020-05-02 MED ORDER — EPINEPHRINE PF 1 MG/ML IJ SOLN: 0.3000 mg | INTRAMUSCULAR | Status: DC | PRN

## 2020-05-02 MED ORDER — DIPHENHYDRAMINE HCL 50 MG/ML IJ SOLN: 50.0000 mg | Freq: Once | INTRAMUSCULAR | Status: DC | PRN

## 2020-05-02 NOTE — Progress Notes (Signed)
[facility] Progress Note   Subjective:  No SOB, wt's down w/ HD to 56.5 w/ no cramping.   Objective Vitals:   05/02/20 0930 05/02/20 1000 05/02/20 1030 05/02/20 1051  BP: 125/72 (!) 141/75 123/75 (!) 146/80  Pulse: 89 89 86 (!) 109  Resp: (!) 26 (!) 29 19 (!) 23  Temp:    97.6 F (36.4 C)  TempSrc:    Oral  SpO2: 99% 100% 100% 100%  Weight:    57.1 kg   Physical Exam General: Well appearing woman, NAD Heart: RRR Lungs: CTA bilat Abdomen: soft, non-tender Extremities: No LE edema Dialysis Access: L AVF + bruit, TDC in R chest  Additional Objective Labs: Basic Metabolic Panel: Recent Labs  Lab 04/30/20 0037 05/01/20 0647 05/02/20 0834  NA 138 137 137  K 3.9 3.2* 3.9  CL 95* 96* 95*  CO2 26 24 28   GLUCOSE 108* 119* 96  BUN 59* 38* 61*  CREATININE 5.59* 4.04* 5.83*  CALCIUM 8.6* 8.3* 8.3*   Liver Function Tests: No results for input(s): AST, ALT, ALKPHOS, BILITOT, PROT, ALBUMIN in the last 168 hours. CBC: Recent Labs  Lab 04/26/20 0333 04/26/20 0333 04/28/20 0134 04/28/20 0134 04/29/20 0927 04/29/20 0927 04/29/20 1011 04/30/20 0037 05/02/20 0834  WBC 13.0*   < > 10.6*   < > 15.3*  --   --  12.5* 9.0  HGB 7.6*   < > 6.4*   < > 11.0*   < > 10.2* 10.0* 9.8*  HCT 23.5*   < > 20.3*   < > 33.3*   < > 30.0* 31.2* 31.1*  MCV 97.9  --  97.1  --  92.5  --   --  93.4 97.8  PLT 156   < > 144*   < > 126*  --   --  133* 141*   < > = values in this interval not displayed.   CBG: Recent Labs  Lab 05/01/20 1128 05/01/20 1645 05/01/20 2051 05/02/20 0633 05/02/20 1125  GLUCAP 136* 215* 113* 82 100*   Studies/Results: VAS US DUPLEX DIALYSIS ACCESS (AVF, AVG)  Result Date: 05/01/2020 DIALYSIS ACCESS Reason for Exam: Routine follow up. Access Site: Left Upper Extremity. Access Type: Brachial-cephalic AVF. History: Left brachial-cephalic AVF placed 01/16/622. Comparison Study: No prior study Performing Technologist: Maudry Mayhew MHA, RDMS, RVT,  RDCS  Examination Guidelines: A complete evaluation includes B-mode imaging, spectral Doppler, color Doppler, and power Doppler as needed of all accessible portions of each vessel. Unilateral testing is considered an integral part of a complete examination. Limited examinations for reoccurring indications may be performed as noted.  Findings: +--------------------+----------+-----------------+--------+ AVF                 PSV (cm/s)Flow Vol (mL/min)Comments +--------------------+----------+-----------------+--------+ Native artery inflow   113           361                +--------------------+----------+-----------------+--------+ AVF Anastomosis        192                              +--------------------+----------+-----------------+--------+  +------------+---------+-------------+---------+------------------------------+ OUTFLOW VEIN   PSV   Diameter (cm)  Depth             Describe                         (cm/s)                 (  cm)                                  +------------+---------+-------------+---------+------------------------------+ Prox UA        82        0.41       0.66                                  +------------+---------+-------------+---------+------------------------------+ Mid UA         80        0.43       0.42          competing branch        +------------+---------+-------------+---------+------------------------------+ Dist UA        670       0.10       0.43      narrowing with possible                                                           thrombus            +------------+---------+-------------+---------+------------------------------+ AC Fossa       140       0.49       0.24                                  +------------+---------+-------------+---------+------------------------------+   Summary: Arteriovenous fistula- Focal elevated velocities in the distal upper arm with possible thrombus. *See table(s) above for  measurements and observations.  Diagnosing physician: Harold Barban MD Electronically signed by Harold Barban MD on 05/01/2020 at 10:22:14 PM.   --------------------------------------------------------------------------------   Final    Medications: . anticoagulant sodium citrate    . famotidine (PEPCID) IV    . sodium chloride     . sodium chloride   Intravenous Once  . amitriptyline  50 mg Oral QHS  . calcitRIOL  0.25 mcg Oral Q M,W,F-HD  . Chlorhexidine Gluconate Cloth  6 each Topical Q0600  . cholecalciferol  1,000 Units Oral Daily  . darbepoetin (ARANESP) injection - DIALYSIS  100 mcg Intravenous Q Sat-HD  . heparin injection (subcutaneous)  5,000 Units Subcutaneous Q8H  . insulin aspart  0-6 Units Subcutaneous TID WC  . levothyroxine  88 mcg Oral QAC breakfast  . metoprolol succinate  100 mg Oral Daily  . multivitamin  1 tablet Oral QHS  . nystatin  5 mL Oral QID  . pantoprazole  40 mg Oral Daily  . predniSONE  60 mg Oral Q breakfast  . sodium chloride flush  3 mL Intravenous Once  . sulfamethoxazole-trimethoprim  1 tablet Oral Once per day on Mon Wed Fri    Dialysis Orders: MWF -  3.5h  400/800  2/2.25 bath   61.5kg (lower at dc)  No heparin (due to presumed pulm hemorrhage, was on Hep 1800u) TDC  +LUA AVF maturing  - Mircera 198mcg Q2wks - last 7/23 - Venofer 100mg  qHD x10 - ordered/not started yet - Calcitriol 0.12mcg qHD  04/19/20 - last CXR diffuse bilat infiltrates, near white-out 04/29/20  CXR - today - almost completely resolved infiltrates  Assessment/Plan: 1. 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 Restarted on plasmapheresis and HD with improvement. PCCM following and did not feel bronch was indicated. F/U CXR very sig improvement on 8/8.  2. BP/ volume: got down to 56kg after HD yest, will lower edw to 56kg at discharge.  3. Anti-GBM/RPGN- re-pulsed w/ IV solumedrol and now on prednisone 60 mg  daily. Stopped oral cytoxan and gave rituxan 1000 mg on 04/19/20 and will get 2nd dose on D#15 tomorrow 8/12. Had her 2nd round of pheresis completed on 05/01/20 (7 sessions). CXR has cleared up significantly and pt is on room air.  4. Dialysis dependent AKI - remains anuric. Biopsy with significant crescents and dense ATN. Probably is ESRD. HD MWF. HD today, then HD on 8/13 as OP assuming tolerates Rituxan well.  5. Anemia due to [facility] and CKD- Continue Aranesp q Sat.No heparin with HD. S/p 2U PRBCs on 8/7, Hgb 10 today. 6. CKD-MBD- Ca/Phos stable. On PO calcitriol q HD.  Kelly Splinter, MD 05/02/2020, 3:28 PM

## 2020-05-02 NOTE — Progress Notes (Signed)
Coordinated plan for 05/03/20 Rituxan administration with pharmacist, Tonye Royalty, RN. Plan is to administer at 1000. Nursing, please enter IV Team consult after premeds are given.

## 2020-05-02 NOTE — Progress Notes (Signed)
Patient placed on overnight pulse ox. Patients sats are 98 and pulse is 88. Pt is doing well at this time. RT will continue to monitor.

## 2020-05-02 NOTE — Plan of Care (Signed)

## 2020-05-02 NOTE — Plan of Care (Signed)
  Problem: Education: Goal: Knowledge of General Education information will improve Description: Including pain rating scale, medication(s)/side effects and non-pharmacologic comfort measures Outcome: Progressing   Problem: Health Behavior/Discharge Planning: Goal: Ability to manage health-related needs will improve Outcome: Progressing   Problem: Clinical Measurements: Goal: Ability to maintain clinical measurements within normal limits will improve Outcome: Progressing   Problem: Clinical Measurements: Goal: Will remain free from infection Outcome: Progressing   Problem: Clinical Measurements: Goal: Diagnostic test results will improve Outcome: Progressing   Problem: Clinical Measurements: Goal: Respiratory complications will improve Outcome: Progressing   Problem: Activity: Goal: Risk for activity intolerance will decrease Outcome: Progressing   Problem: Nutrition: Goal: Adequate nutrition will be maintained Outcome: Progressing   Problem: Coping: Goal: Level of anxiety will decrease Outcome: Progressing   Problem: Pain Managment: Goal: General experience of comfort will improve Outcome: Progressing

## 2020-05-02 NOTE — Progress Notes (Signed)
Patient ID: Jasmine White, female   DOB: [birth year], 74 y.o.   MRN: [id number]  PROGRESS NOTE    SHAKINAH NAVIS  TDV:[id number] DOB: Apr 29, 1946 DOA: 04/16/2020 PCP: Chevis Pretty, FNP   Brief Narrative:  74 y.o.femalewith recent hospitalization from 6/26-7/20/2081for AKI due to ANCA positive RPGN now on HD, HTN, hypothyroidism, HLD and osteoarthritis presented with dyspnea, orthopnea and hemoptysis and admitted for acute respiratory failure with hypoxia likely due to pulmonary edema, possible pneumonia and vasculitis.Antibiotics were subsequently discontinued after it was felt that symptoms were mostly because of vasculitis. PCCM and nephrology  were consulted.  Assessment & Plan:   Acute respiratory failure with hypoxia -Patient was hypoxic on presentation requiring nonrebreather.  COVID-19 test was negative. -Chest x-ray notable for multifocal pneumonia with CT chest extensive, dense heterogeneous and consolidative airspace disease bilaterally worsened in comparison with previous exam on 04/05/2020 -Etiology likely multifactorial with pulmonary edema and pulmonary vasculitis most likely especially with hemoptysis, less likely infectious/pneumonia.  Initially was started on IV ciprofloxacin, cefepime and vancomycin which was eventually discontinued as vasculitis leading etiology -Patient is currently on oral prednisone.  She also required plasmapheresis, last treatment on 05/01/2020.  Nephrology following. -Resolved.  Currently on room air. 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  Currently on oral prednisone and completed 7 treatments of plasmapheresis on 05/02/2019. -To receive second dose of Rituxan on 05/03/2020 -Continue Bactrim on Monday/Wednesday/Friday for PCP prophylaxis  End-stage renal disease secondary to  ANCA positive RPGN -Nephrology following.  Dialysis as per nephrology schedule.  Dialysis started on 03/21/2020.  Follow-up with vascular surgery as an outpatient regarding AV fistula placement  Hypokalemia -Improved  Anemia of chronic disease -Patient required packed red cell transfusion during hospitalization.  Hemoglobin currently stable.  Transfuse if hemoglobin less than 7  Essential hypertension -Blood pressure stable.  Continue metoprolol  Hypothyroidism  -Continue levothyroxine  Chronic back pain/mood disorder -Continue amitriptyline  Weakness -PT/OT recommending home health PT    DVT prophylaxis: Heparin Code Status: Full Family Communication: None at bedside Disposition Plan: Status is: Inpatient  Remains inpatient appropriate because:IV treatments appropriate due to intensity of illness or inability to take PO and Inpatient level of care appropriate due to severity of illness   Dispo: The patient is from: Home              Anticipated d/c is to: Home              Anticipated d/c date is: 1 day              Patient currently is not medically stable to d/c.   Consultants: Nephrology PCCM: Signed off on 04/24/2020  Procedures: Plasmapheresis: 7 treatments till 05/01/2020  Antimicrobials:  Anti-infectives (From admission, onward)   Start     Dose/Rate Route Frequency Ordered Stop   04/21/20 1515  vancomycin (VANCOREADY) IVPB 500 mg/100 mL  Status:  Discontinued        500 mg 100 mL/hr over 60 Minutes Intravenous  Once 04/21/20 1511 04/21/20 1511   04/21/20 1515  vancomycin (VANCOCIN) 500 mg in sodium chloride 0.9 % 100 mL IVPB        500 mg 100 mL/hr over 60 Minutes Intravenous  Once 04/21/20 1512 04/21/20 1710   04/21/20 1200  vancomycin (VANCOCIN) IVPB 500 mg/100 ml premix  Status:  Discontinued  500 mg 100 mL/hr over 60 Minutes Intravenous  Once 04/20/20 1125 04/20/20 1135   04/21/20 1200  vancomycin (VANCOCIN) IVPB 500 mg/100 ml premix  Status:   Discontinued        500 mg 100 mL/hr over 60 Minutes Intravenous Every T-Th-Sa (Hemodialysis) 04/21/20 0727 04/23/20 1407   04/20/20 1200  vancomycin (VANCOCIN) IVPB 500 mg/100 ml premix  Status:  Discontinued        500 mg 100 mL/hr over 60 Minutes Intravenous Every Dialysis 04/19/20 1024 04/19/20 1040   04/20/20 1200  vancomycin (VANCOCIN) IVPB 500 mg/100 ml premix  Status:  Discontinued        500 mg 100 mL/hr over 60 Minutes Intravenous  Once 04/19/20 1040 04/20/20 1125   04/19/20 1000  vancomycin (VANCOCIN) IVPB 500 mg/100 ml premix  Status:  Discontinued        500 mg 100 mL/hr over 60 Minutes Intravenous  Once 04/19/20 0850 04/19/20 0952   04/18/20 1200  vancomycin (VANCOCIN) IVPB 500 mg/100 ml premix        500 mg 100 mL/hr over 60 Minutes Intravenous Every M-W-F (Hemodialysis) 04/18/20 0931 04/18/20 1457   04/17/20 1400  vancomycin (VANCOREADY) IVPB 500 mg/100 mL        500 mg 100 mL/hr over 60 Minutes Intravenous  Once 04/17/20 1318 04/17/20 1736   04/17/20 1000  ceFEPIme (MAXIPIME) 1 g in sodium chloride 0.9 % 100 mL IVPB  Status:  Discontinued        1 g 200 mL/hr over 30 Minutes Intravenous Every 24 hours 04/16/20 0914 04/16/20 1858   04/17/20 0000  vancomycin variable dose per unstable renal function (pharmacist dosing)  Status:  Discontinued         Does not apply See admin instructions 04/16/20 0914 04/22/20 1229   04/16/20 2000  ceFEPIme (MAXIPIME) 1 g in sodium chloride 0.9 % 100 mL IVPB        1 g 200 mL/hr over 30 Minutes Intravenous Every 24 hours 04/16/20 1858 04/23/20 2007   04/16/20 2000  ciprofloxacin (CIPRO) IVPB 400 mg  Status:  Discontinued       Note to Pharmacy: Immunocompromised host   400 mg 200 mL/hr over 60 Minutes Intravenous Every 24 hours 04/16/20 1858 04/18/20 1348   04/16/20 2000  vancomycin (VANCOREADY) IVPB 500 mg/100 mL        500 mg 100 mL/hr over 60 Minutes Intravenous  Once 04/16/20 1858 04/17/20 0121   04/16/20 1800   sulfamethoxazole-trimethoprim (BACTRIM) 400-80 MG per tablet 1 tablet     Discontinue     1 tablet Oral Once per day on Mon Wed Fri 04/16/20 1341     04/16/20 1545  sulfamethoxazole-trimethoprim (BACTRIM) 400-80 MG per tablet 1 tablet  Status:  Discontinued        1 tablet Oral Every M-W-F 04/16/20 1534 04/16/20 1639   04/16/20 1300  ciprofloxacin (CIPRO) IVPB 400 mg  Status:  Discontinued       Note to Pharmacy: Immunocompromised host   400 mg 200 mL/hr over 60 Minutes Intravenous Every 24 hours 04/16/20 1217 04/16/20 1858   04/16/20 1215  ciprofloxacin (CIPRO) IVPB 400 mg  Status:  Discontinued       Note to Pharmacy: Immunocompromised host   400 mg 200 mL/hr over 60 Minutes Intravenous Every 8 hours 04/16/20 1204 04/16/20 1217   04/16/20 0915  vancomycin (VANCOCIN) IVPB 1000 mg/200 mL premix  Status:  Discontinued        1,000  mg 200 mL/hr over 60 Minutes Intravenous  Once 04/16/20 0902 04/16/20 0914   04/16/20 0915  ceFEPIme (MAXIPIME) 2 g in sodium chloride 0.9 % 100 mL IVPB        2 g 200 mL/hr over 30 Minutes Intravenous  Once 04/16/20 0902 04/16/20 0948   04/16/20 0915  vancomycin (VANCOREADY) IVPB 1250 mg/250 mL        1,250 mg 166.7 mL/hr over 90 Minutes Intravenous  Once 04/16/20 0914 04/16/20 1134       Subjective: Patient seen and examined at bedside undergoing hemodialysis.  Denies any worsening shortness of breath, chest pain, nausea or vomiting.  Objective: Vitals:   05/02/20 0930 05/02/20 1000 05/02/20 1030 05/02/20 1051  BP: 125/72 (!) 141/75 123/75 (!) 146/80  Pulse: 89 89 86 (!) 109  Resp: (!) 26 (!) 29 19 (!) 23  Temp:    97.6 F (36.4 C)  TempSrc:    Oral  SpO2: 99% 100% 100% 100%  Weight:    57.1 kg    Intake/Output Summary (Last 24 hours) at 05/02/2020 1147 Last data filed at 05/02/2020 1051 Gross per 24 hour  Intake 792 ml  Output 800 ml  Net -8 ml   Filed Weights   05/01/20 0550 05/02/20 0712 05/02/20 1051  Weight: 57.2 kg 56.8 kg 57.1 kg     Examination:  General exam: Appears calm and comfortable.  Looks chronically ill Respiratory system: Bilateral decreased breath sounds at bases with scattered crackles Cardiovascular system: S1 & S2 heard, Rate controlled Gastrointestinal system: Abdomen is nondistended, soft and nontender. Normal bowel sounds heard. Extremities: No cyanosis, clubbing; lower extremity edema present   Data Reviewed: I have personally reviewed following labs and imaging studies  CBC: Recent Labs  Lab 04/26/20 0333 04/26/20 0333 04/28/20 0134 04/29/20 0927 04/29/20 1011 04/30/20 0037 05/02/20 0834  WBC 13.0*  --  10.6* 15.3*  --  12.5* 9.0  HGB 7.6*   < > 6.4* 11.0* 10.2* 10.0* 9.8*  HCT 23.5*   < > 20.3* 33.3* 30.0* 31.2* 31.1*  MCV 97.9  --  97.1 92.5  --  93.4 97.8  PLT 156  --  144* 126*  --  133* 141*   < > = values in this interval not displayed.   Basic Metabolic Panel: Recent Labs  Lab 04/26/20 0333 04/27/20 0905 04/28/20 0134 04/28/20 0134 04/29/20 0927 04/29/20 1011 04/30/20 0037 05/01/20 0647 05/02/20 0834  NA 138   < > 138   < > 135 143 138 137 137  K 4.3   < > 3.9   < > 4.0 3.3* 3.9 3.2* 3.9  CL 95*   < > 94*   < > 96* 94* 95* 96* 95*  CO2 29   < > 27  --  22  --  26 24 28   GLUCOSE 114*   < > 108*   < > 180* 186* 108* 119* 96  BUN 52*   < > 47*   < > 45* 37* 59* 38* 61*  CREATININE 5.22*   < > 5.21*   < > 4.46* 4.30* 5.59* 4.04* 5.83*  CALCIUM 8.2*   < > 8.7*  --  8.6*  --  8.6* 8.3* 8.3*  MG 2.1  --  2.1  --  2.2  --  2.1  --  2.1   < > = values in this interval not displayed.   GFR: Estimated Creatinine Clearance: 7.6 mL/min (A) (by C-G formula based on SCr  of 5.83 mg/dL (H)). Liver Function Tests: No results for input(s): AST, ALT, ALKPHOS, BILITOT, PROT, ALBUMIN in the last 168 hours. No results for input(s): LIPASE, AMYLASE in the last 168 hours. No results for input(s): AMMONIA in the last 168 hours. Coagulation Profile: No results for input(s): INR,  PROTIME in the last 168 hours. Cardiac Enzymes: No results for input(s): CKTOTAL, CKMB, CKMBINDEX, TROPONINI in the last 168 hours. BNP (last 3 results) No results for input(s): PROBNP in the last 8760 hours. HbA1C: No results for input(s): HGBA1C in the last 72 hours. CBG: Recent Labs  Lab 05/01/20 0606 05/01/20 1128 05/01/20 1645 05/01/20 2051 05/02/20 0633  GLUCAP 97 136* 215* 113* 82   Lipid Profile: No results for input(s): CHOL, HDL, LDLCALC, TRIG, CHOLHDL, LDLDIRECT in the last 72 hours. Thyroid Function Tests: No results for input(s): TSH, T4TOTAL, FREET4, T3FREE, THYROIDAB in the last 72 hours. Anemia Panel: No results for input(s): VITAMINB12, FOLATE, FERRITIN, TIBC, IRON, RETICCTPCT in the last 72 hours. Sepsis Labs: No results for input(s): PROCALCITON, LATICACIDVEN in the last 168 hours.  No results found for this or any previous visit (from the past 240 hour(s)).       Radiology Studies: VAS US DUPLEX DIALYSIS ACCESS (AVF, AVG)  Result Date: 05/01/2020 DIALYSIS ACCESS Reason for Exam: Routine follow up. Access Site: Left Upper Extremity. Access Type: Brachial-cephalic AVF. History: Left brachial-cephalic AVF placed 5/78/4696. Comparison Study: No prior study Performing Technologist: Maudry Mayhew MHA, RDMS, RVT, RDCS  Examination Guidelines: A complete evaluation includes B-mode imaging, spectral Doppler, color Doppler, and power Doppler as needed of all accessible portions of each vessel. Unilateral testing is considered an integral part of a complete examination. Limited examinations for reoccurring indications may be performed as noted.  Findings: +--------------------+----------+-----------------+--------+ AVF                 PSV (cm/s)Flow Vol (mL/min)Comments +--------------------+----------+-----------------+--------+ Native artery inflow   113           361                +--------------------+----------+-----------------+--------+ AVF  Anastomosis        192                              +--------------------+----------+-----------------+--------+  +------------+---------+-------------+---------+------------------------------+ OUTFLOW VEIN   PSV   Diameter (cm)  Depth             Describe                         (cm/s)                 (cm)                                  +------------+---------+-------------+---------+------------------------------+ Prox UA        82        0.41       0.66                                  +------------+---------+-------------+---------+------------------------------+ Mid UA         80        0.43       0.42          competing branch        +------------+---------+-------------+---------+------------------------------+  Dist UA        670       0.10       0.43      narrowing with possible                                                           thrombus            +------------+---------+-------------+---------+------------------------------+ AC Fossa       140       0.49       0.24                                  +------------+---------+-------------+---------+------------------------------+   Summary: Arteriovenous fistula- Focal elevated velocities in the distal upper arm with possible thrombus. *See table(s) above for measurements and observations.  Diagnosing physician: Harold Barban MD Electronically signed by Harold Barban MD on 05/01/2020 at 10:22:14 PM.   --------------------------------------------------------------------------------   Final         Scheduled Meds: . sodium chloride   Intravenous Once  . amitriptyline  50 mg Oral QHS  . calcitRIOL  0.25 mcg Oral Q M,W,F-HD  . Chlorhexidine Gluconate Cloth  6 each Topical Q0600  . cholecalciferol  1,000 Units Oral Daily  . darbepoetin (ARANESP) injection - DIALYSIS  100 mcg Intravenous Q Sat-HD  . heparin injection (subcutaneous)  5,000 Units Subcutaneous Q8H  . insulin aspart  0-6 Units  Subcutaneous TID WC  . levothyroxine  88 mcg Oral QAC breakfast  . metoprolol succinate  100 mg Oral Daily  . multivitamin  1 tablet Oral QHS  . nystatin  5 mL Oral QID  . pantoprazole  40 mg Oral Daily  . predniSONE  60 mg Oral Q breakfast  . sodium chloride flush  3 mL Intravenous Once  . sulfamethoxazole-trimethoprim  1 tablet Oral Once per day on Mon Wed Fri   Continuous Infusions: . anticoagulant sodium citrate    . famotidine (PEPCID) IV    . sodium chloride            Aline August, MD [facility] 05/02/2020, 11:47 AM

## 2020-05-03 LAB — GLUCOSE, CAPILLARY
Glucose-Capillary: 84 mg/dL (ref 70–99)
Glucose-Capillary: 118 mg/dL — ABNORMAL HIGH (ref 70–99)

## 2020-05-03 LAB — CBC WITH DIFFERENTIAL/PLATELET
Abs Immature Granulocytes: 0.09 10*3/uL — ABNORMAL HIGH (ref 0.00–0.07)
Basophils Absolute: 0 10*3/uL (ref 0.0–0.1)
Basophils Relative: 0 %
Eosinophils Absolute: 0.1 10*3/uL (ref 0.0–0.5)
Eosinophils Relative: 1 %
HCT: 29.3 % — ABNORMAL LOW (ref 36.0–46.0)
Hemoglobin: 9.4 g/dL — ABNORMAL LOW (ref 12.0–15.0)
Immature Granulocytes: 1 %
Lymphocytes Relative: 13 %
Lymphs Abs: 1.2 10*3/uL (ref 0.7–4.0)
MCH: 31.1 pg (ref 26.0–34.0)
MCHC: 32.1 g/dL (ref 30.0–36.0)
MCV: 97 fL (ref 80.0–100.0)
Monocytes Absolute: 0.7 10*3/uL (ref 0.1–1.0)
Monocytes Relative: 8 %
Neutro Abs: 6.8 10*3/uL (ref 1.7–7.7)
Neutrophils Relative %: 77 %
Platelets: 147 10*3/uL — ABNORMAL LOW (ref 150–400)
RBC: 3.02 MIL/uL — ABNORMAL LOW (ref 3.87–5.11)
RDW: 17.9 % — ABNORMAL HIGH (ref 11.5–15.5)
WBC: 8.9 10*3/uL (ref 4.0–10.5)
nRBC: 0 % (ref 0.0–0.2)

## 2020-05-03 LAB — BASIC METABOLIC PANEL
Anion gap: 11 (ref 5–15)
BUN: 33 mg/dL — ABNORMAL HIGH (ref 8–23)
CO2: 26 mmol/L (ref 22–32)
Calcium: 7.9 mg/dL — ABNORMAL LOW (ref 8.9–10.3)
Chloride: 100 mmol/L (ref 98–111)
Creatinine, Ser: 3.73 mg/dL — ABNORMAL HIGH (ref 0.44–1.00)
GFR calc Af Amer: 13 mL/min — ABNORMAL LOW (ref >60)
GFR calc non Af Amer: 11 mL/min — ABNORMAL LOW (ref >60)
Glucose, Bld: 109 mg/dL — ABNORMAL HIGH (ref 70–99)
Potassium: 3.9 mmol/L (ref 3.5–5.1)
Sodium: 137 mmol/L (ref 135–145)

## 2020-05-03 MED ORDER — RENA-VITE PO TABS: 1.0000 | ORAL_TABLET | Freq: Every day | ORAL | 0 refills | Status: DC

## 2020-05-03 MED ORDER — ALBUTEROL SULFATE HFA 108 (90 BASE) MCG/ACT IN AERS
2.0000 | INHALATION_SPRAY | Freq: Four times a day (QID) | RESPIRATORY_TRACT | 0 refills | Status: DC | PRN
Start: 2020-05-03 — End: 2020-12-19

## 2020-05-03 MED ORDER — PREDNISONE 20 MG PO TABS
60.0000 mg | ORAL_TABLET | Freq: Every day | ORAL | Status: DC
Start: 2020-05-03 — End: 2020-12-12

## 2020-05-03 MED ORDER — CALCITRIOL 0.25 MCG PO CAPS: 0.2500 ug | ORAL_CAPSULE | ORAL | 0 refills | Status: DC

## 2020-05-03 NOTE — Progress Notes (Signed)
Discharge instructions and follow up appointments reviewed patient verbalizes understanding.  Written copy of the instructions given to patient.  All questions answered.  Patient via Wheelchair to spouses waiting vehile in stable  Condition.

## 2020-05-03 NOTE — Progress Notes (Signed)
[facility] Progress Note   Subjective:  No c/o today  Objective Vitals:   05/03/20 0345 05/03/20 0752 05/03/20 1059 05/03/20 1100  BP: (!) 141/81 (!) 121/59 131/84   Pulse: 90 86  84  Resp: (!) 24 (!) 21    Temp: 97.6 F (36.4 C) 98 F (36.7 C) 98.1 F (36.7 C)   TempSrc: Oral Oral    SpO2: 97% 99%  99%  Weight:       Physical Exam General: Well appearing woman, NAD Heart: RRR Lungs: CTA bilat Abdomen: soft, non-tender Extremities: No LE edema Dialysis Access: L AVF + bruit, TDC in R chest  Additional Objective Labs: Basic Metabolic Panel: Recent Labs  Lab 05/01/20 0647 05/02/20 0834 05/03/20 0032  NA 137 137 137  K 3.2* 3.9 3.9  CL 96* 95* 100  CO2 24 28 26   GLUCOSE 119* 96 109*  BUN 38* 61* 33*  CREATININE 4.04* 5.83* 3.73*  CALCIUM 8.3* 8.3* 7.9*   Liver Function Tests: No results for input(s): AST, ALT, ALKPHOS, BILITOT, PROT, ALBUMIN in the last 168 hours. CBC: Recent Labs  Lab 04/28/20 0134 04/28/20 0134 04/29/20 0927 04/29/20 1011 04/30/20 0037 05/02/20 0834 05/03/20 0032  WBC 10.6*   < > 15.3*   < > 12.5* 9.0 8.9  NEUTROABS  --   --   --   --   --   --  6.8  HGB 6.4*   < > 11.0*   < > 10.0* 9.8* 9.4*  HCT 20.3*   < > 33.3*   < > 31.2* 31.1* 29.3*  MCV 97.1  --  92.5  --  93.4 97.8 97.0  PLT 144*   < > 126*   < > 133* 141* 147*   < > = values in this interval not displayed.   CBG: Recent Labs  Lab 05/02/20 1125 05/02/20 1523 05/02/20 2129 05/03/20 0629 05/03/20 1059  GLUCAP 100* 213* 147* 84 118*   Studies/Results: VAS US DUPLEX DIALYSIS ACCESS (AVF, AVG)  Result Date: 05/01/2020 DIALYSIS ACCESS Reason for Exam: Routine follow up. Access Site: Left Upper Extremity. Access Type: Brachial-cephalic AVF. History: Left brachial-cephalic AVF placed 12/29/8117. Comparison Study: No prior study Performing Technologist: Maudry Mayhew MHA, RDMS, RVT, RDCS  Examination Guidelines: A complete evaluation includes B-mode  imaging, spectral Doppler, color Doppler, and power Doppler as needed of all accessible portions of each vessel. Unilateral testing is considered an integral part of a complete examination. Limited examinations for reoccurring indications may be performed as noted.  Findings: +--------------------+----------+-----------------+--------+ AVF                 PSV (cm/s)Flow Vol (mL/min)Comments +--------------------+----------+-----------------+--------+ Native artery inflow   113           361                +--------------------+----------+-----------------+--------+ AVF Anastomosis        192                              +--------------------+----------+-----------------+--------+  +------------+---------+-------------+---------+------------------------------+ OUTFLOW VEIN   PSV   Diameter (cm)  Depth             Describe                         (cm/s)                 (cm)                                  +------------+---------+-------------+---------+------------------------------+  Prox UA        82        0.41       0.66                                  +------------+---------+-------------+---------+------------------------------+ Mid UA         80        0.43       0.42          competing branch        +------------+---------+-------------+---------+------------------------------+ Dist UA        670       0.10       0.43      narrowing with possible                                                           thrombus            +------------+---------+-------------+---------+------------------------------+ AC Fossa       140       0.49       0.24                                  +------------+---------+-------------+---------+------------------------------+   Summary: Arteriovenous fistula- Focal elevated velocities in the distal upper arm with possible thrombus. *See table(s) above for measurements and observations.  Diagnosing physician: Harold Barban MD  Electronically signed by Harold Barban MD on 05/01/2020 at 10:22:14 PM.   --------------------------------------------------------------------------------   Final    Medications: . anticoagulant sodium citrate    . famotidine (PEPCID) IV    . sodium chloride     . sodium chloride   Intravenous Once  . amitriptyline  50 mg Oral QHS  . calcitRIOL  0.25 mcg Oral Q M,W,F-HD  . Chlorhexidine Gluconate Cloth  6 each Topical Q0600  . cholecalciferol  1,000 Units Oral Daily  . darbepoetin (ARANESP) injection - DIALYSIS  100 mcg Intravenous Q Sat-HD  . heparin injection (subcutaneous)  5,000 Units Subcutaneous Q8H  . insulin aspart  0-6 Units Subcutaneous TID WC  . levothyroxine  88 mcg Oral QAC breakfast  . metoprolol succinate  100 mg Oral Daily  . multivitamin  1 tablet Oral QHS  . nystatin  5 mL Oral QID  . pantoprazole  40 mg Oral Daily  . predniSONE  60 mg Oral Q breakfast  . sodium chloride flush  3 mL Intravenous Once  . sulfamethoxazole-trimethoprim  1 tablet Oral Once per day on Mon Wed Fri    Dialysis Orders: MWF - [facility] 3.5h  400/800  2/2.25 bath   61.5kg (lower at dc)  No heparin (due to presumed pulm hemorrhage, was on Hep 1800u) TDC  +LUA AVF maturing  - Mircera 13mcg Q2wks - last 7/23 - Venofer 100mg  qHD x10 - ordered/not started yet - Calcitriol 0.63mcg qHD  04/19/20 - last CXR diffuse bilat infiltrates, near white-out 04/29/20  CXR - today - almost completely resolved infiltrates  Assessment/Plan: 1. 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 Restarted on plasmapheresis and HD with improvement. PCCM following and did not feel bronch was indicated. F/U CXR very sig  improvement on 8/8.  2. BP/ volume: got down to 56kg after HD yest, will lower edw to 56kg at discharge.  3. Anti-GBM/RPGN- re-pulsed w/ IV solumedrol. Stopped oral cytoxan and gave rituxan 1 gm on 7/29 with 2nd dose due today 8/12. Had another round  of pheresis completed on 05/01/20 (7 sessions). Will dc on prednisone 60 mg/ d and will be followed up by renal team (Dr Marval Regal) and pulm (Dr Halford Chessman, f/u mid Sept). CXR has cleared up significantly and pt is on room air.  4. Dialysis dependent AKI - remains anuric. Biopsy with significant crescents and dense ATN. Probably is ESRD. HD MWF.  5. Anemia due to [facility] and CKD- Continue Aranesp q Sat.No heparin with HD. S/p 2U PRBCs on 8/7, Hgb 10 today. 6. CKD-MBD- Ca/Phos stable. On PO calcitriol q HD. 7. Dispo - okay for dc after Rituxan 2nd dose today   Kelly Splinter, MD 05/03/2020, 11:43 AM

## 2020-05-03 NOTE — TOC Transition Note (Signed)
Transition of Care (TOC) - CM/SW Discharge Note Marvetta Gibbons RN, BSN Transitions of Care Unit 4E- RN Case Manager See Treatment Team for direct phone # Cross Coverage for [place]   Patient Details  Name: ROSHAUNDA STARKEY MRN: [id number] Date of Birth: 08/26/1946  Transition of Care [facility]) CM/SW Contact:  Dawayne Patricia, RN Phone Number: 05/03/2020, 12:02 PM   Clinical Narrative:    Pt stable for transition home, order placed for HHPT- call made to [facility] with Alvis Lemmings pt was active with them for [facility] services PTA and they will be able to resume services post discharge. As per previous CM note this is family's preference to continue [facility] services with [facility].  [place] services to resume with [facility]    Barriers to Discharge: Barriers Resolved   Patient Goals and CMS Choice Patient states their goals for this hospitalization and ongoing recovery are:: to go home CMS Medicare.gov Compare Post Acute Care list provided to:: Legal Guardian Choice offered to / list presented to : Adult Children  Discharge Placement               Home with [facility]        Discharge Plan and Services   Discharge Planning Services: CM Consult            DME Arranged: N/A DME Agency: NA       HH Arranged: PT [place] Agency: [place] Date [facility] Agency Contacted: 05/03/20 Time [place]: 1202 Representative spoke with at [place]: [place] ([place]) Interventions     Readmission Risk Interventions No flowsheet data found.

## 2020-05-03 NOTE — Discharge Summary (Signed)
Physician Discharge Summary  Jasmine White YTK:[id number] DOB: [birth year] DOA: 04/16/2020  PCP: Chevis Pretty, FNP  Admit date: 04/16/2020 Discharge date: 05/03/2020  Admitted From: Home Disposition: Home  Recommendations for Outpatient Follow-up:  1. Follow up with PCP in 1 week with repeat CBC/BMP 2. Outpatient follow-up with nephrology and pulmonary 3. Follow up in ED if symptoms worsen or new appear   Home Health: Home with PT Equipment/Devices: None  Discharge Condition: Stable CODE STATUS: Full Diet recommendation: Heart healthy/carb modified/renal hemodialysis diet  Brief/Interim Summary: 74 y.o.femalewith recent hospitalization from 6/26-7/20/2024for AKI due to ANCA positive RPGN now on HD, HTN, hypothyroidism, HLD and osteoarthritis presented with dyspnea, orthopnea and hemoptysis and admitted for acute respiratory failure with hypoxia likely due to pulmonary edema, possible pneumonia and vasculitis.Antibiotics were subsequently discontinued after it was felt that symptoms were mostly because of vasculitis. PCCM and nephrology  were consulted. During the hospitalization, her respiratory status has improved and she is currently on room air.  She has been switched to oral prednisone.  She has received 7 treatments of plasmapheresis.  She will receive her second dose of Rituxan today and if she remains stable, she will be discharged home.  Nephrology has cleared the patient for discharge with outpatient follow-up with nephrology and pulmonary.  Discharge Diagnoses:   Acute respiratory failure with hypoxia -Patient was hypoxic on presentation requiring nonrebreather.  COVID-19 test was negative. -Chest x-ray notable for multifocal pneumonia with CT chest extensive, dense heterogeneous and consolidative airspace disease bilaterally worsened in comparison with previous exam on 04/05/2020 -Etiology likely multifactorial with pulmonary edema and pulmonary vasculitis most  likely especially with hemoptysis, less likely infectious/pneumonia. Initially was started on IV ciprofloxacin, cefepime and vancomycin which was eventually discontinued as vasculitis leading etiology -Patient is currently on oral prednisone.  She also required plasmapheresis, last treatment on 05/01/2020.  Nephrology following. -Resolved.  Currently on room air. 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  Currently on oral prednisone and completed 7 treatments of plasmapheresis on 05/02/2019. -To receive second dose of Rituxan today on 05/03/2020 -Continue Bactrim on Monday/Wednesday/Friday for PCP prophylaxis -If patient remains stable after Rituxan today, she will be discharged home on prednisone 60 mg daily.  Outpatient follow-up with nephrology regarding tapering the dose of steroids.    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  Dialysis as per nephrology schedule.  Dialysis started on 03/21/2020.  Follow-up with vascular surgery as an outpatient regarding AV fistula placement -Continue outpatient follow-up with hemodialysis as scheduled.  Hypokalemia -Improved  Anemia of chronic disease -Patient required packed red cell transfusion during hospitalization.  Hemoglobin currently stable.    Outpatient follow-up.  Essential hypertension -Blood pressure stable.  Continue metoprolol  Hypothyroidism  -Continue levothyroxine  Chronic back pain/mood disorder -Continue amitriptyline  Weakness -PT/OT recommending home health PT   Discharge Instructions  Discharge Instructions    Ambulatory referral to Pulmonology   Complete by: As directed    Hospital follow-up   Reason for referral: Other   Diet - low sodium heart healthy   Complete by: As directed    Diet Carb Modified    Complete by: As directed    Increase activity slowly   Complete by: As directed    No wound care   Complete by: As directed      Allergies as of 05/03/2020  Reactions   Hctz [hydrochlorothiazide] Other (See Comments)   Hx of severe Hyponatremia while taking HCTZ - now contraindicated   Percocet [oxycodone-acetaminophen] Nausea Only   Ultram [tramadol] Nausea Only   Makes her crazy and nauseated      Medication List    STOP taking these medications   cyclophosphamide 50 MG capsule Commonly known as: CYTOXAN   HYDROcodone-acetaminophen 5-325 MG tablet Commonly known as: NORCO/VICODIN     TAKE these medications   albuterol 108 (90 Base) MCG/ACT inhaler Commonly known as: VENTOLIN HFA Inhale 2 puffs into the lungs every 6 (six) hours as needed for wheezing or shortness of breath.   amitriptyline 50 MG tablet Commonly known as: ELAVIL Take 1 tablet (50 mg total) by mouth at bedtime.   calcitRIOL 0.25 MCG capsule Commonly known as: ROCALTROL Take 1 capsule (0.25 mcg total) by mouth every Monday, Wednesday, and Friday with hemodialysis. Start taking on: May 04, 2020   metoprolol succinate 100 MG 24 hr tablet Commonly known as: TOPROL-XL Take 1 tablet (100 mg total) by mouth daily. Take with or immediately following a meal.   multivitamin Tabs tablet Take 1 tablet by mouth at bedtime.   omeprazole 20 MG capsule Commonly known as: PriLOSEC Take 1 capsule (20 mg total) by mouth daily.   predniSONE 20 MG tablet Commonly known as: DELTASONE Take 3 tablets (60 mg total) by mouth daily. What changed: how much to take   sulfamethoxazole-trimethoprim 400-80 MG tablet Commonly known as: BACTRIM Take 1 tablet by mouth every Monday, Wednesday, and Friday.   Synthroid 88 MCG tablet Generic drug: levothyroxine TAKE 1 TABLET BY MOUTH ONCE DAILY BEFORE BREAKFAST What changed:   how much to take  how to take this  when to take this  additional instructions    Vitamin D3 25 MCG tablet Commonly known as: Vitamin D Take 1 tablet (1,000 Units total) by mouth daily.       Follow-up Information    Chesley Mires, MD Follow up on 06/08/2020.   Specialty: Pulmonary Disease Why: 10:30am This will be at the [facility], NOT at [facility].  Contact information: [address] [place] [address] [phone]        [facility] Follow up.   Specialty: Home Health Services Contact information: [place] [address] [phone]        Donato Heinz, MD. Schedule an appointment as soon as possible for a visit in 1 week(s).   Specialty: Nephrology Contact information: [place] [address] [phone]        Chevis Pretty, [place]. Schedule an appointment as soon as possible for a visit in 1 week(s).   Specialty: Family Medicine Contact information: [address] [place] [address] [phone]              Allergies  Allergen Reactions  . Hctz [Hydrochlorothiazide] Other (See Comments)    Hx of severe Hyponatremia while taking HCTZ - now contraindicated  . Percocet [Oxycodone-Acetaminophen] Nausea Only  . Ultram [Tramadol] Nausea Only    Makes her crazy and nauseated    Consultations:  Nephrology/PCCM   Procedures/Studies: DG Chest 1 View  Result Date: 04/19/2020 CLINICAL DATA:  Shortness of breath EXAM: CHEST  1 VIEW COMPARISON:  04/18/2020. FINDINGS: Dual-lumen catheter with tip over SVC in stable position. Heart size stable. Progressive severe diffuse bilateral pulmonary infiltrates/edema. No prominent pleural effusion. No pneumothorax. IMPRESSION: 1.  Dual-lumen catheter  noted with tip over SVC in stable position. 2. Progressive severe diffuse bilateral pulmonary infiltrates/edema. Electronically Signed   By: Marcello Moores  Register   On: 04/19/2020 07:46   DG Chest 2 View  Result Date: 04/18/2020 CLINICAL DATA:  Chest  pain and shortness of breath EXAM: CHEST - 2 VIEW COMPARISON:  April 16, 2020 chest radiograph and chest CT FINDINGS: Widespread multifocal airspace opacity persists. No new opacities are evident. Heart size and pulmonary vascularity are normal. No adenopathy. Central catheter tip is in the superior vena cava near the cavoatrial junction. No pneumothorax. There is mild anterior wedging of the L1 vertebral body. IMPRESSION: Multifocal airspace opacity, likely multifocal pneumonia. No new opacity evident. Stable cardiac silhouette. Central catheter as described. No evident pneumothorax. Electronically Signed   By: Lowella Grip III M.D.   On: 04/18/2020 08:24   DG Chest 2 View  Result Date: 04/16/2020 CLINICAL DATA:  Shortness of breath, chest pain EXAM: CHEST - 2 VIEW COMPARISON:  70 20 FINDINGS: Interval extubation G2. Right dialysis catheter remains in place, unchanged. Sensitive patchy bilateral airspace opacities are noted, new since prior study compatible with multifocal pneumonia. No effusions. Heart is normal size. IMPRESSION: Extensive patchy bilateral airspace disease compatible with multifocal pneumonia. Electronically Signed   By: Rolm Baptise M.D.   On: 04/16/2020 08:34   CT CHEST WO CONTRAST  Result Date: 04/05/2020 CLINICAL DATA:  Hypoxia EXAM: CT CHEST WITHOUT CONTRAST TECHNIQUE: Multidetector CT imaging of the chest was performed following the standard protocol without IV contrast. COMPARISON:  Chest radiograph April 05, 2020; chest CT March 24, 2020. FINDINGS: Cardiovascular: There is no thoracic aortic aneurysm. There are scattered foci of calcification in visualized great vessels, most notably in the left proximal subclavian artery. There are foci of aortic atherosclerosis. There are foci of coronary artery calcification. A small amount of pericardial fluid is within physiologic range. No pericardial thickening is evident. Central catheter tip is near the cavoatrial junction.  Mediastinum/Nodes: Thyroid is unremarkable in appearance. There are multiple subcentimeter mediastinal lymph nodes. No adenopathy by size criteria evident. No esophageal lesions are appreciable. Lungs/Pleura: There are sizable free-flowing pleural effusions bilaterally. There is compressive atelectatic change in both lung bases. There is extensive airspace opacity throughout the upper lobes, significantly increased from prior CT. There is airspace consolidation in the right lower lobe as well. There is patchy infiltrate in the lingula as well as atelectatic change more inferiorly in the lingula and right middle lobe regions. No pneumothorax. Upper Abdomen: There is upper abdominal aortic atherosclerosis. Visualized upper abdominal structures otherwise appear unremarkable. Musculoskeletal: There are foci of degenerative change in the lumbar spine. There is a large Schmorl's node superiorly at L1. No blastic or lytic bone lesions are evident. There are no chest wall lesions appreciable. IMPRESSION: 1. Sizable free-flowing pleural effusions bilaterally. Compressive atelectasis in both lower lobes. Extensive multifocal airspace opacity, most severe in the upper lobes bilaterally with progression bilaterally compared to most recent CT examination. Areas of patchy atelectasis also seen. 2. There is aortic atherosclerosis as well as foci of coronary artery calcification. Probable hemodynamically significant obstruction due to calcification in the proximal left subclavian artery. 3.  Central catheter tip near cavoatrial junction. 4.  No adenopathy by size criteria. Aortic Atherosclerosis (ICD10-I70.0). Electronically Signed   By: Lowella Grip III M.D.   On: 04/05/2020 13:29   CT Chest High Resolution  Result Date: 04/16/2020 CLINICAL DATA:  Concern for ILD, severe shortness of breath and cough for several weeks  EXAM: CT CHEST WITHOUT CONTRAST TECHNIQUE: Multidetector CT imaging of the chest was performed following  the standard protocol without intravenous contrast. High resolution imaging of the lungs, as well as inspiratory and expiratory imaging, was performed. COMPARISON:  CT chest, 04/05/2020 FINDINGS: Cardiovascular: Large-bore right neck multi lumen vascular catheter. Aortic atherosclerosis. Normal heart size. Scattered three-vessel coronary artery calcifications. No pericardial effusion. Mediastinum/Nodes: No enlarged mediastinal, hilar, or axillary lymph nodes. Thyroid gland, trachea, and esophagus demonstrate no significant findings. Lungs/Pleura: Moderate right, small left pleural effusions are slightly decreased compared to prior examination dated 04/05/2020. There is very extensive, dense, somewhat geographic heterogeneous and consolidative airspace disease throughout the lungs, which is significantly worsened compared to prior examination. There is interlobular septal thickening throughout, which is similar to prior examination. Upper Abdomen: No acute abnormality. Musculoskeletal: No chest wall mass or suspicious bone lesions identified. IMPRESSION: 1. There is very extensive, dense, somewhat geographic heterogeneous and consolidative airspace disease throughout the lungs, which is significantly worsened compared to prior examination dated 04/05/2020. There is interlobular septal thickening throughout, which is similar to prior examination. Findings are most consistent with worsened multifocal infection, with underlying edema. 2. Moderate right, small left pleural effusions are slightly decreased compared to prior examination dated 04/05/2020. 3. There are no obvious features of interstitial lung disease, however examination is very limited by the presence of extensive acute airspace opacity and pleural effusions. Follow-up examination could be performed at the resolution of acute presentation. 4. Coronary artery disease.  Aortic Atherosclerosis (ICD10-I70.0). Electronically Signed   By: Eddie Candle M.D.   On:  04/16/2020 11:34   DG CHEST PORT 1 VIEW  Result Date: 04/29/2020 CLINICAL DATA:  Chest pain and shortness of breath.  Hemodialysis. EXAM: PORTABLE CHEST 1 VIEW COMPARISON:  04/19/2020 FINDINGS: RIGHT-sided dialysis catheter tips overlie the level of superior vena cava. Heart size is enlarged and partially obscured. There are bilateral pleural effusions. There has been significant improvement in aeration of the lungs bilaterally. Patchy airspace filling opacities persist throughout the lungs. IMPRESSION: Significant improvement in aeration of the lungs. Small bilateral pleural effusions. Electronically Signed   By: [name] M.D.   On: 04/29/2020 12:15   DG Chest Port 1 View  Result Date: 04/08/2020 CLINICAL DATA:  Respiratory failure. EXAM: PORTABLE CHEST 1 VIEW COMPARISON:  April 07, 2020 FINDINGS: Stable support apparatus. Improved aeration of the lungs with less prominent bilateral opacities. Small bilateral pleural effusions. Cardiomediastinal silhouette is normal. Mediastinal contours appear intact. IMPRESSION: 1. Improved aeration of the lungs with less prominent bilateral airspace opacities. 2. Small bilateral pleural effusions. Electronically Signed   By: Fidela Salisbury M.D.   On: 04/08/2020 15:12   DG CHEST PORT 1 VIEW  Result Date: 04/07/2020 CLINICAL DATA:  74 year old female with respiratory failure EXAM: PORTABLE CHEST 1 VIEW COMPARISON:  04/06/2020 FINDINGS: None cardiomediastinal silhouette unchanged in size and contour. Endotracheal tube is unchanged terminating approximately 3.4 cm above the carina. Unchanged right IJ tunneled hemodialysis catheter. Unchanged gastric tube terminating out of the field of view. Similar appearance of the lungs with coarsened interstitial markings and opacity at the left lung base obscuring the left hemidiaphragm. No pneumothorax. IMPRESSION: Similar appearance of the chest x-ray with mild bilateral interstitial opacities and left basilar opacity  which may reflect a combination of atelectasis/consolidation and/or pleural fluid. Unchanged endotracheal tube, gastric tube, right IJ hemodialysis catheter. Electronically Signed   By: Corrie Mckusick D.O.   On: 04/07/2020 09:00   DG Chest [facility]  Result Date: 04/06/2020 CLINICAL DATA:  Intubation EXAM: PORTABLE CHEST 1 VIEW COMPARISON:  Yesterday FINDINGS: Endotracheal tube tip is halfway between the clavicular heads and carina. The enteric tube reaches the stomach. Dialysis catheter with tip at the upper cavoatrial junction. Mild improvement in airspace disease in the left upper lobe with possible worsening at the base where the diaphragm is less well seen. No visible effusion or pneumothorax. Normal heart size. IMPRESSION: 1. Unremarkable hardware positioning. 2. Infiltrates have mildly improved at the left upper lobe and increased at the left base. Electronically Signed   By: Monte Fantasia M.D.   On: 04/06/2020 08:27   DG CHEST PORT 1 VIEW  Result Date: 04/05/2020 CLINICAL DATA:  Orogastric tube placement. EXAM: PORTABLE CHEST 1 VIEW COMPARISON:  Chest radiograph and CT earlier today. FINDINGS: Enteric tube tip is below the diaphragm not included in the field of view. The side-port is not visualized. Endotracheal tube tip at the level of the clavicular heads, 4.4 cm from the carina. Right-sided dialysis catheter unchanged with tip in the lower SVC. Heart is normal in size. Left pleural effusion and basilar opacity appears similar. Right pleural effusion has improved. Improving perihilar opacities on the right, with slight increase on the left. Persistent pulmonary edema. No pneumothorax. IMPRESSION: 1. Enteric tube tip below the diaphragm not included in the field of view. 2. Endotracheal tube tip 4.4 cm from the carina. 3. Improved right pleural effusion, unchanged left pleural effusion. 4. Shifting perihilar opacities with improvement on the right but worsening on the left, may be due to  differences in positioning. Electronically Signed   By: Keith Rake M.D.   On: 04/05/2020 16:44   DG CHEST PORT 1 VIEW  Result Date: 04/05/2020 CLINICAL DATA:  Shortness of breath. EXAM: PORTABLE CHEST 1 VIEW COMPARISON:  03/22/2020 FINDINGS: The right IJ dialysis catheter is in good position, unchanged. The cardiac silhouette, mediastinal and hilar contours are within normal limits. Patchy asymmetric airspace process in the lungs along with bilateral pleural effusions. No pneumothorax. IMPRESSION: Patchy asymmetric airspace process and bilateral pleural effusions. Electronically Signed   By: Marijo Sanes M.D.   On: 04/05/2020 05:54   DG Abd Portable 1V  Result Date: 04/05/2020 CLINICAL DATA:  Orogastric tube placement. EXAM: PORTABLE ABDOMEN - 1 VIEW COMPARISON:  Chest radiograph of earlier in the day. FINDINGS: nasogastric tube terminates in the descending duodenum versus being looped in the stomach. Left base atelectasis with small left pleural effusion. Non-obstructive bowel gas pattern. Minimal convex right lumbar spine curvature with lumbar spine fixation. IMPRESSION: Nasogastric tube terminating either in the descending duodenum or looped within a redundant stomach. Consider retraction on the order of 10 cm. Electronically Signed   By: Abigail Miyamoto M.D.   On: 04/05/2020 19:01   VAS US DUPLEX DIALYSIS ACCESS (AVF, AVG)  Result Date: 05/01/2020 DIALYSIS ACCESS Reason for Exam: Routine follow up. Access Site: Left Upper Extremity. Access Type: Brachial-cephalic AVF. History: Left brachial-cephalic AVF placed 9/60/4540. Comparison Study: No prior study Performing Technologist: Maudry Mayhew MHA, RDMS, RVT, RDCS  Examination Guidelines: A complete evaluation includes B-mode imaging, spectral Doppler, color Doppler, and power Doppler as needed of all accessible portions of each vessel. Unilateral testing is considered an integral part of a complete examination. Limited examinations for  reoccurring indications may be performed as noted.  Findings: +--------------------+----------+-----------------+--------+ AVF                 PSV (cm/s)Flow Vol (mL/min)Comments +--------------------+----------+-----------------+--------+ Native artery inflow  113           361                +--------------------+----------+-----------------+--------+ AVF Anastomosis        192                              +--------------------+----------+-----------------+--------+  +------------+---------+-------------+---------+------------------------------+ OUTFLOW VEIN   PSV   Diameter (cm)  Depth             Describe                         (cm/s)                 (cm)                                  +------------+---------+-------------+---------+------------------------------+ Prox UA        82        0.41       0.66                                  +------------+---------+-------------+---------+------------------------------+ Mid UA         80        0.43       0.42          competing branch        +------------+---------+-------------+---------+------------------------------+ Dist UA        670       0.10       0.43      narrowing with possible                                                           thrombus            +------------+---------+-------------+---------+------------------------------+ AC Fossa       140       0.49       0.24                                  +------------+---------+-------------+---------+------------------------------+   Summary: Arteriovenous fistula- Focal elevated velocities in the distal upper arm with possible thrombus. *See table(s) above for measurements and observations.  Diagnosing physician: Harold Barban MD Electronically signed by Harold Barban MD on 05/01/2020 at 10:22:14 PM.   --------------------------------------------------------------------------------   Final    ECHOCARDIOGRAM COMPLETE  Result Date:  04/06/2020    ECHOCARDIOGRAM REPORT   Patient Name:   Jasmine White Date of Exam: 04/06/2020 Medical Rec #:  [id number]       Height:       66.0 in Accession #:    [id number]      Weight:       143.3 lb Date of Birth:  18-Feb-1946       BSA:          1.736 m Patient Age:    47 years        BP:           145/73 mmHg Patient Gender:  F               HR:           90 bpm. Exam Location:  Inpatient Procedure: 2D Echo, Cardiac Doppler and Color Doppler Indications:    I50.23 Acute on chronic systolic (congestive) heart failure  History:        Patient has no prior history of Echocardiogram examinations.                 Risk Factors:Hypertension and Dyslipidemia. Cancer.                 Hypothyroidism.  Sonographer:    Jonelle Sidle Dance Referring Phys: [id number] Candee Furbish  Sonographer Comments: Echo performed with patient supine and on artificial respirator. IMPRESSIONS  1. Left ventricular ejection fraction, by estimation, is 55 to 60%. The left ventricle has normal function. The left ventricle has no regional wall motion abnormalities. Left ventricular diastolic parameters are consistent with Grade I diastolic dysfunction (impaired relaxation).  2. Right ventricular systolic function is normal. The right ventricular size is normal.  3. Left atrial size was mildly dilated.  4. Trivial pericardial effusion. Large pleural effusion in the left lateral region.  5. The mitral valve is normal in structure. Moderate mitral valve regurgitation. No evidence of mitral stenosis.  6. Aortic regurgitation - vena contracta 0.58 cm. The aortic valve is tricuspid. Aortic valve regurgitation is moderate to severe. Mild aortic valve sclerosis is present, with no evidence of aortic valve stenosis.  7. The inferior vena cava is normal in size with greater than 50% respiratory variability, suggesting right atrial pressure of 3 mmHg. FINDINGS  Left Ventricle: Left ventricular ejection fraction, by estimation, is 55 to 60%. The left ventricle has  normal function. The left ventricle has no regional wall motion abnormalities. The left ventricular internal cavity size was normal in size. There is  no left ventricular hypertrophy. Left ventricular diastolic parameters are consistent with Grade I diastolic dysfunction (impaired relaxation). Right Ventricle: The right ventricular size is normal. No increase in right ventricular wall thickness. Right ventricular systolic function is normal. Left Atrium: Left atrial size was mildly dilated. Right Atrium: Right atrial size was normal in size. Pericardium: Trivial pericardial effusion. Trivial pericardial effusion is present. Mitral Valve: The mitral valve is normal in structure. Normal mobility of the mitral valve leaflets. Mild mitral annular calcification. Moderate mitral valve regurgitation. No evidence of mitral valve stenosis. Tricuspid Valve: The tricuspid valve is normal in structure. Tricuspid valve regurgitation is mild . No evidence of tricuspid stenosis. Aortic Valve: Aortic regurgitation - vena contracta 0.58 cm. The aortic valve is tricuspid. Aortic valve regurgitation is moderate to severe. Aortic regurgitation PHT measures 185 msec. Mild aortic valve sclerosis is present, with no evidence of aortic valve stenosis. Pulmonic Valve: The pulmonic valve was normal in structure. Pulmonic valve regurgitation is not visualized. No evidence of pulmonic stenosis. Aorta: The aortic root is normal in size and structure. Venous: The inferior vena cava is normal in size with greater than 50% respiratory variability, suggesting right atrial pressure of 3 mmHg. IAS/Shunts: No atrial level shunt detected by color flow Doppler. Additional Comments: There is a large pleural effusion in the left lateral region.  LEFT VENTRICLE PLAX 2D LVIDd:         4.90 cm  Diastology LVIDs:         3.30 cm  LV e' lateral:   5.77 cm/s LV PW:  0.80 cm  LV E/e' lateral: 22.7 LV IVS:        0.80 cm  LV e' medial:    5.55 cm/s LVOT  diam:     2.00 cm  LV E/e' medial:  23.6 LV SV:         76 LV SV Index:   44 LVOT Area:     3.14 cm  RIGHT VENTRICLE             IVC RV Basal diam:  3.30 cm     IVC diam: 1.90 cm RV Mid diam:    2.70 cm RV S prime:     14.80 cm/s TAPSE (M-mode): 2.8 cm LEFT ATRIUM             Index       RIGHT ATRIUM           Index LA diam:        3.20 cm 1.84 cm/m  RA Area:     13.90 cm LA Vol (A2C):   79.5 ml 45.81 ml/m RA Volume:   32.70 ml  18.84 ml/m LA Vol (A4C):   32.7 ml 18.84 ml/m LA Biplane Vol: 52.6 ml 30.31 ml/m  AORTIC VALVE LVOT Vmax:   95.60 cm/s LVOT Vmean:  73.900 cm/s LVOT VTI:    0.241 m AI PHT:      185 msec  AORTA Ao Root diam: 3.40 cm Ao Asc diam:  3.70 cm MITRAL VALVE MV Area (PHT): 4.60 cm      SHUNTS MV Decel Time: 165 msec      Systemic VTI:  0.24 m MR Peak grad:    136.4 mmHg  Systemic Diam: 2.00 cm MR Mean grad:    92.0 mmHg MR Vmax:         584.00 cm/s MR Vmean:        457.0 cm/s MR PISA:         1.57 cm MR PISA Eff ROA: 10 mm MR PISA Radius:  0.50 cm MV E velocity: 131.00 cm/s MV A velocity: 122.00 cm/s MV E/A ratio:  1.07 Candee Furbish MD Electronically signed by Candee Furbish MD Signature Date/Time: 04/06/2020/11:38:12 AM    Final        Subjective: Patient seen and examined at bedside.  She feels much better and states that she is ready to go home after her Rituxan treatment today.  No overnight fever, vomiting or worsening shortness of breath.  Discharge Exam: Vitals:   05/03/20 0345 05/03/20 0752  BP: (!) 141/81 (!) 121/59  Pulse: 90 86  Resp: (!) 24 (!) 21  Temp: 97.6 F (36.4 C) 98 F (36.7 C)  SpO2: 97% 99%    General: Pt is alert, awake, not in acute distress Cardiovascular: rate controlled, S1/S2 + Respiratory: bilateral decreased breath sounds at bases with some scattered crackles Abdominal: Soft, NT, ND, bowel sounds + Extremities: Trace lower extremity edema; no cyanosis    The results of significant diagnostics from this hospitalization (including  imaging, microbiology, ancillary and laboratory) are listed below for reference.     Microbiology: No results found for this or any previous visit (from the past 240 hour(s)).   Labs: BNP (last 3 results) Recent Labs    03/21/20 0509 03/22/20 0103 04/05/20 1638  BNP 2,263.4* 3,320.9* 0,973.5*   Basic Metabolic Panel: Recent Labs  Lab 04/28/20 0134 04/28/20 0134 04/29/20 3299 04/29/20 2426 04/29/20 1011 04/30/20 0037 05/01/20 0647 05/02/20 0834 05/03/20 0032  NA 138   < >  135   < > 143 138 137 137 137  K 3.9   < > 4.0   < > 3.3* 3.9 3.2* 3.9 3.9  CL 94*   < > 96*   < > 94* 95* 96* 95* 100  CO2 27   < > 22  --   --  26 24 28 26   GLUCOSE 108*   < > 180*   < > 186* 108* 119* 96 109*  BUN 47*   < > 45*   < > 37* 59* 38* 61* 33*  CREATININE 5.21*   < > 4.46*   < > 4.30* 5.59* 4.04* 5.83* 3.73*  CALCIUM 8.7*   < > 8.6*  --   --  8.6* 8.3* 8.3* 7.9*  MG 2.1  --  2.2  --   --  2.1  --  2.1  --    < > = values in this interval not displayed.   Liver Function Tests: No results for input(s): AST, ALT, ALKPHOS, BILITOT, PROT, ALBUMIN in the last 168 hours. No results for input(s): LIPASE, AMYLASE in the last 168 hours. No results for input(s): AMMONIA in the last 168 hours. CBC: Recent Labs  Lab 04/28/20 0134 04/28/20 0134 04/29/20 0927 04/29/20 1011 04/30/20 0037 05/02/20 0834 05/03/20 0032  WBC 10.6*  --  15.3*  --  12.5* 9.0 8.9  NEUTROABS  --   --   --   --   --   --  6.8  HGB 6.4*   < > 11.0* 10.2* 10.0* 9.8* 9.4*  HCT 20.3*   < > 33.3* 30.0* 31.2* 31.1* 29.3*  MCV 97.1  --  92.5  --  93.4 97.8 97.0  PLT 144*  --  126*  --  133* 141* 147*   < > = values in this interval not displayed.   Cardiac Enzymes: No results for input(s): CKTOTAL, CKMB, CKMBINDEX, TROPONINI in the last 168 hours. BNP: Invalid input(s): POCBNP CBG: Recent Labs  Lab 05/02/20 0633 05/02/20 1125 05/02/20 1523 05/02/20 2129 05/03/20 0629  GLUCAP 82 100* 213* 147* 84   D-Dimer No  results for input(s): DDIMER in the last 72 hours. Hgb A1c No results for input(s): HGBA1C in the last 72 hours. Lipid Profile No results for input(s): CHOL, HDL, LDLCALC, TRIG, CHOLHDL, LDLDIRECT in the last 72 hours. Thyroid function studies No results for input(s): TSH, T4TOTAL, T3FREE, THYROIDAB in the last 72 hours.  Invalid input(s): FREET3 Anemia work up No results for input(s): VITAMINB12, FOLATE, FERRITIN, TIBC, IRON, RETICCTPCT in the last 72 hours. Urinalysis    Component Value Date/Time   COLORURINE AMBER (A) 03/18/2020 2143   APPEARANCEUR HAZY (A) 03/18/2020 2143   APPEARANCEUR Clear 03/03/2019 0920   LABSPEC 1.008 03/18/2020 2143   PHURINE 6.0 03/18/2020 2143   GLUCOSEU NEGATIVE 03/18/2020 2143   HGBUR LARGE (A) 03/18/2020 2143   BILIRUBINUR NEGATIVE 03/18/2020 2143   BILIRUBINUR Negative 03/03/2019 0920   KETONESUR NEGATIVE 03/18/2020 2143   PROTEINUR 100 (A) 03/18/2020 2143   UROBILINOGEN negative 12/21/2012 1509   NITRITE NEGATIVE 03/18/2020 2143   LEUKOCYTESUR SMALL (A) 03/18/2020 2143   Sepsis Labs Invalid input(s): PROCALCITONIN,  WBC,  LACTICIDVEN Microbiology No results found for this or any previous visit (from the past 240 hour(s)).   Time coordinating discharge: 35 minutes  SIGNED:   Aline August, MD  [facility] 05/03/2020, 10:39 AM

## 2020-05-03 NOTE — Plan of Care (Signed)
  Problem: Education: Goal: Knowledge of General Education information will improve Description: Including pain rating scale, medication(s)/side effects and non-pharmacologic comfort measures Outcome: Adequate for Discharge   Problem: Health Behavior/Discharge Planning: Goal: Ability to manage health-related needs will improve Outcome: Adequate for Discharge   Problem: Clinical Measurements: Goal: Ability to maintain clinical measurements within normal limits will improve Outcome: Adequate for Discharge Goal: Will remain free from infection Outcome: Adequate for Discharge Goal: Diagnostic test results will improve Outcome: Adequate for Discharge Goal: Respiratory complications will improve Outcome: Adequate for Discharge Goal: Cardiovascular complication will be avoided Outcome: Adequate for Discharge   Problem: Activity: Goal: Risk for activity intolerance will decrease Outcome: Adequate for Discharge   Problem: Nutrition: Goal: Adequate nutrition will be maintained Outcome: Adequate for Discharge   Problem: Coping: Goal: Level of anxiety will decrease Outcome: Adequate for Discharge   Problem: Elimination: Goal: Will not experience complications related to bowel motility Outcome: Adequate for Discharge Goal: Will not experience complications related to urinary retention Outcome: Adequate for Discharge   Problem: Pain Managment: Goal: General experience of comfort will improve Outcome: Adequate for Discharge   Problem: Safety: Goal: Ability to remain free from injury will improve Outcome: Adequate for Discharge   Problem: Skin Integrity: Goal: Risk for impaired skin integrity will decrease Outcome: Adequate for Discharge   Problem: Acute Rehab PT Goals(only PT should resolve) Goal: Pt Will Ambulate Outcome: Adequate for Discharge Goal: PT Additional Goal #1 Outcome: Adequate for Discharge

## 2020-05-03 NOTE — Discharge Planning (Signed)
Discharge summary faxed to [facility].

## 2020-05-03 NOTE — Progress Notes (Signed)
Orders acknowledge by Anson Fret RN and not Marianna Payment RN. [employer]

## 2020-05-03 NOTE — Progress Notes (Signed)
Removed patient from the over night pulse ox and downloaded the results, Printed a copy and place in patients paper chart.

## 2020-05-04 ENCOUNTER — Telehealth: Payer: Self-pay

## 2020-05-04 DIAGNOSIS — N179 Acute kidney failure, unspecified: Secondary | ICD-10-CM | POA: Diagnosis not present

## 2020-05-04 DIAGNOSIS — Z992 Dependence on renal dialysis: Secondary | ICD-10-CM | POA: Diagnosis not present

## 2020-05-04 DIAGNOSIS — N2581 Secondary hyperparathyroidism of renal origin: Secondary | ICD-10-CM | POA: Diagnosis not present

## 2020-05-04 NOTE — Telephone Encounter (Signed)
° ° °  Transitional Care Management  Contact Attempt Attempt Date:05/04/2020 Attempted By:   1st unsuccessful TCM contact attempt.   I reached out to [name] on her preferred telephone number to discuss Transitional Care Management, medication reconciliation, and to schedule a TCM hospital follow-up with her PCP at [facility].  Discharge Date: 05/03/2020 Location: [facility] Discharge Dx: Hypoxia  Recommendations for Outpatient Follow-up:  1. Follow up with PCP in 1 week with repeat CBC/BMP 2. Outpatient follow-up with nephrology and pulmonary 3. Follow up in ED if symptoms worsen or new appear  Plan I left a HIPPA compliant message for her to return my call.  Will attempt to contact again within the 2 business day post discharge window if she does not return my call.

## 2020-05-05 DIAGNOSIS — I120 Hypertensive chronic kidney disease with stage 5 chronic kidney disease or end stage renal disease: Secondary | ICD-10-CM | POA: Diagnosis not present

## 2020-05-05 DIAGNOSIS — N390 Urinary tract infection, site not specified: Secondary | ICD-10-CM | POA: Diagnosis not present

## 2020-05-05 DIAGNOSIS — M47816 Spondylosis without myelopathy or radiculopathy, lumbar region: Secondary | ICD-10-CM | POA: Diagnosis not present

## 2020-05-05 DIAGNOSIS — I288 Other diseases of pulmonary vessels: Secondary | ICD-10-CM | POA: Diagnosis not present

## 2020-05-05 DIAGNOSIS — N186 End stage renal disease: Secondary | ICD-10-CM | POA: Diagnosis not present

## 2020-05-05 DIAGNOSIS — J9601 Acute respiratory failure with hypoxia: Secondary | ICD-10-CM | POA: Diagnosis not present

## 2020-05-05 DIAGNOSIS — G8929 Other chronic pain: Secondary | ICD-10-CM | POA: Diagnosis not present

## 2020-05-05 DIAGNOSIS — N179 Acute kidney failure, unspecified: Secondary | ICD-10-CM | POA: Diagnosis not present

## 2020-05-05 DIAGNOSIS — D631 Anemia in chronic kidney disease: Secondary | ICD-10-CM | POA: Diagnosis not present

## 2020-05-07 DIAGNOSIS — Z992 Dependence on renal dialysis: Secondary | ICD-10-CM | POA: Diagnosis not present

## 2020-05-07 DIAGNOSIS — N179 Acute kidney failure, unspecified: Secondary | ICD-10-CM | POA: Diagnosis not present

## 2020-05-07 DIAGNOSIS — N2581 Secondary hyperparathyroidism of renal origin: Secondary | ICD-10-CM | POA: Diagnosis not present

## 2020-05-08 ENCOUNTER — Ambulatory Visit (INDEPENDENT_AMBULATORY_CARE_PROVIDER_SITE_OTHER): Payer: Medicare PPO | Admitting: Physician Assistant

## 2020-05-08 ENCOUNTER — Other Ambulatory Visit: Payer: Self-pay | Admitting: *Deleted

## 2020-05-08 ENCOUNTER — Encounter: Payer: Self-pay | Admitting: *Deleted

## 2020-05-08 ENCOUNTER — Other Ambulatory Visit: Payer: Self-pay

## 2020-05-08 VITALS — BP 131/71 | HR 79 | Temp 98.2°F | Resp 20 | Ht 66.0 in | Wt 129.9 lb

## 2020-05-08 DIAGNOSIS — N186 End stage renal disease: Secondary | ICD-10-CM

## 2020-05-08 DIAGNOSIS — Z992 Dependence on renal dialysis: Secondary | ICD-10-CM

## 2020-05-08 NOTE — Progress Notes (Signed)
POST OPERATIVE OFFICE NOTE    CC:  F/u for surgery  HPI:  This is a 74 y.o. female who is s/p Left brachiocephalic AV fistula creation on 04/02/20 by Dr. Donnetta Hutching.    Pt returns today for follow up.   She denise left hand pain, loss of sensation or loss of strength.  She is currently on HD MWF.  She has a right TDC placed by IR.    Allergies  Allergen Reactions  . Hctz [Hydrochlorothiazide] Other (See Comments)    Hx of severe Hyponatremia while taking HCTZ - now contraindicated  . Percocet [Oxycodone-Acetaminophen] Nausea Only  . Ultram [Tramadol] Nausea Only    Makes her crazy and nauseated    Current Outpatient Medications  Medication Sig Dispense Refill  . albuterol (VENTOLIN HFA) 108 (90 Base) MCG/ACT inhaler Inhale 2 puffs into the lungs every 6 (six) hours as needed for wheezing or shortness of breath. 8 g 0  . amitriptyline (ELAVIL) 50 MG tablet Take 1 tablet (50 mg total) by mouth at bedtime. 90 tablet 1  . calcitRIOL (ROCALTROL) 0.25 MCG capsule Take 1 capsule (0.25 mcg total) by mouth every Monday, Wednesday, and Friday with hemodialysis. 30 capsule 0  . cholecalciferol (VITAMIN D) 25 MCG tablet Take 1 tablet (1,000 Units total) by mouth daily. 30 tablet 6  . metoprolol succinate (TOPROL-XL) 100 MG 24 hr tablet Take 1 tablet (100 mg total) by mouth daily. Take with or immediately following a meal. 90 tablet 1  . multivitamin (RENA-VIT) TABS tablet Take 1 tablet by mouth at bedtime. 30 tablet 0  . omeprazole (PRILOSEC) 20 MG capsule Take 1 capsule (20 mg total) by mouth daily. 30 capsule 11  . predniSONE (DELTASONE) 20 MG tablet Take 3 tablets (60 mg total) by mouth daily.    Marland Kitchen sulfamethoxazole-trimethoprim (BACTRIM) 400-80 MG tablet Take 1 tablet by mouth every Monday, Wednesday, and Friday. 30 tablet 6  . SYNTHROID 88 MCG tablet TAKE 1 TABLET BY MOUTH ONCE DAILY BEFORE BREAKFAST (Patient taking differently: Take 88 mcg by mouth daily before breakfast. ) 90 tablet 1   No  current facility-administered medications for this visit.     ROS:  See HPI  Physical Exam: Findings:  +--------------------+----------+-----------------+--------+  AVF         PSV (cm/s)Flow Vol (mL/min)Comments  +--------------------+----------+-----------------+--------+  Native artery inflow  113      361          +--------------------+----------+-----------------+--------+  AVF Anastomosis     192                 +--------------------+----------+-----------------+--------+     +------------+---------+-------------+---------+---------------------------  ---+  OUTFLOW VEIN  PSV  Diameter (cm) Depth       Describe                (cm/s)         (cm)                    +------------+---------+-------------+---------+---------------------------  ---+  Prox UA     82    0.41    0.66                    +------------+---------+-------------+---------+---------------------------  ---+  Mid UA     80    0.43    0.42      competing branch       +------------+---------+-------------+---------+---------------------------  ---+  Dist UA     670    0.10  0.43    narrowing with possible                                  thrombus         +------------+---------+-------------+---------+---------------------------  ---+  AC Fossa    140    0.49    0.24                    +------------+---------+-------------+---------+---------------------------  ---+    Incision:  Incision has healed well Extremities:  Palpable radial pulses, grip 5/5 and sensation intact left hand. Lungs: Non labored breathing     Assessment/Plan:  This is a 74 y.o. female who is s/p: Left BC fistula creation 4 weeks out.  There is a narrowed area in the  fistula distally and palpation feels pulsatile.    We will order a fistulogram with possible intervention.  If the cephalic fistula fails she does have an acceptable basilic vein we can use.  She and her husband agree with this plan.  Roxy Horseman PA-C [facility] [phone]  Clinic MD:  Early

## 2020-05-09 ENCOUNTER — Inpatient Hospital Stay: Payer: Medicare PPO | Admitting: Primary Care

## 2020-05-09 DIAGNOSIS — N179 Acute kidney failure, unspecified: Secondary | ICD-10-CM | POA: Diagnosis not present

## 2020-05-09 DIAGNOSIS — N2581 Secondary hyperparathyroidism of renal origin: Secondary | ICD-10-CM | POA: Diagnosis not present

## 2020-05-09 DIAGNOSIS — Z992 Dependence on renal dialysis: Secondary | ICD-10-CM | POA: Diagnosis not present

## 2020-05-10 DIAGNOSIS — B372 Candidiasis of skin and nail: Secondary | ICD-10-CM | POA: Diagnosis not present

## 2020-05-10 DIAGNOSIS — I776 Arteritis, unspecified: Secondary | ICD-10-CM | POA: Diagnosis not present

## 2020-05-10 DIAGNOSIS — N179 Acute kidney failure, unspecified: Secondary | ICD-10-CM | POA: Diagnosis not present

## 2020-05-10 DIAGNOSIS — N390 Urinary tract infection, site not specified: Secondary | ICD-10-CM | POA: Diagnosis not present

## 2020-05-10 DIAGNOSIS — I120 Hypertensive chronic kidney disease with stage 5 chronic kidney disease or end stage renal disease: Secondary | ICD-10-CM | POA: Diagnosis not present

## 2020-05-10 DIAGNOSIS — G8929 Other chronic pain: Secondary | ICD-10-CM | POA: Diagnosis not present

## 2020-05-10 DIAGNOSIS — I10 Essential (primary) hypertension: Secondary | ICD-10-CM | POA: Diagnosis not present

## 2020-05-10 DIAGNOSIS — D631 Anemia in chronic kidney disease: Secondary | ICD-10-CM | POA: Diagnosis not present

## 2020-05-10 DIAGNOSIS — J9601 Acute respiratory failure with hypoxia: Secondary | ICD-10-CM | POA: Diagnosis not present

## 2020-05-10 DIAGNOSIS — E039 Hypothyroidism, unspecified: Secondary | ICD-10-CM | POA: Diagnosis not present

## 2020-05-10 DIAGNOSIS — E876 Hypokalemia: Secondary | ICD-10-CM | POA: Diagnosis not present

## 2020-05-10 DIAGNOSIS — I288 Other diseases of pulmonary vessels: Secondary | ICD-10-CM | POA: Diagnosis not present

## 2020-05-10 DIAGNOSIS — N186 End stage renal disease: Secondary | ICD-10-CM | POA: Diagnosis not present

## 2020-05-10 DIAGNOSIS — M47816 Spondylosis without myelopathy or radiculopathy, lumbar region: Secondary | ICD-10-CM | POA: Diagnosis not present

## 2020-05-10 DIAGNOSIS — Z992 Dependence on renal dialysis: Secondary | ICD-10-CM | POA: Diagnosis not present

## 2020-05-10 DIAGNOSIS — E785 Hyperlipidemia, unspecified: Secondary | ICD-10-CM | POA: Diagnosis not present

## 2020-05-10 LAB — FUNGUS CULTURE WITH STAIN

## 2020-05-10 LAB — FUNGUS CULTURE RESULT

## 2020-05-10 LAB — FUNGAL ORGANISM REFLEX

## 2020-05-11 DIAGNOSIS — N2581 Secondary hyperparathyroidism of renal origin: Secondary | ICD-10-CM | POA: Diagnosis not present

## 2020-05-11 DIAGNOSIS — Z992 Dependence on renal dialysis: Secondary | ICD-10-CM | POA: Diagnosis not present

## 2020-05-11 DIAGNOSIS — N179 Acute kidney failure, unspecified: Secondary | ICD-10-CM | POA: Diagnosis not present

## 2020-05-12 DIAGNOSIS — D631 Anemia in chronic kidney disease: Secondary | ICD-10-CM | POA: Diagnosis not present

## 2020-05-12 DIAGNOSIS — N186 End stage renal disease: Secondary | ICD-10-CM | POA: Diagnosis not present

## 2020-05-12 DIAGNOSIS — I288 Other diseases of pulmonary vessels: Secondary | ICD-10-CM | POA: Diagnosis not present

## 2020-05-12 DIAGNOSIS — M4800 Spinal stenosis, site unspecified: Secondary | ICD-10-CM | POA: Diagnosis not present

## 2020-05-12 DIAGNOSIS — F39 Unspecified mood [affective] disorder: Secondary | ICD-10-CM | POA: Diagnosis not present

## 2020-05-12 DIAGNOSIS — I2510 Atherosclerotic heart disease of native coronary artery without angina pectoris: Secondary | ICD-10-CM | POA: Diagnosis not present

## 2020-05-12 DIAGNOSIS — M47816 Spondylosis without myelopathy or radiculopathy, lumbar region: Secondary | ICD-10-CM | POA: Diagnosis not present

## 2020-05-12 DIAGNOSIS — J9601 Acute respiratory failure with hypoxia: Secondary | ICD-10-CM | POA: Diagnosis not present

## 2020-05-12 DIAGNOSIS — I120 Hypertensive chronic kidney disease with stage 5 chronic kidney disease or end stage renal disease: Secondary | ICD-10-CM | POA: Diagnosis not present

## 2020-05-14 DIAGNOSIS — N2581 Secondary hyperparathyroidism of renal origin: Secondary | ICD-10-CM | POA: Diagnosis not present

## 2020-05-14 DIAGNOSIS — Z992 Dependence on renal dialysis: Secondary | ICD-10-CM | POA: Diagnosis not present

## 2020-05-14 DIAGNOSIS — N179 Acute kidney failure, unspecified: Secondary | ICD-10-CM | POA: Diagnosis not present

## 2020-05-15 ENCOUNTER — Telehealth: Payer: Self-pay | Admitting: Primary Care

## 2020-05-15 ENCOUNTER — Other Ambulatory Visit (HOSPITAL_COMMUNITY)
Admission: RE | Admit: 2020-05-15 | Discharge: 2020-05-15 | Disposition: A | Discharge status: Home / Self Care | Payer: Medicare PPO | Source: Ambulatory Visit | Attending: Vascular Surgery | Admitting: Vascular Surgery

## 2020-05-15 ENCOUNTER — Ambulatory Visit (INDEPENDENT_AMBULATORY_CARE_PROVIDER_SITE_OTHER): Payer: Medicare PPO

## 2020-05-15 ENCOUNTER — Other Ambulatory Visit: Payer: Self-pay

## 2020-05-15 ENCOUNTER — Encounter: Payer: Self-pay | Admitting: Primary Care

## 2020-05-15 ENCOUNTER — Ambulatory Visit: Payer: Medicare PPO | Admitting: Primary Care

## 2020-05-15 VITALS — BP 120/70 | HR 86 | Temp 96.4°F | Ht 66.0 in | Wt 127.2 lb

## 2020-05-15 DIAGNOSIS — I776 Arteritis, unspecified: Secondary | ICD-10-CM

## 2020-05-15 DIAGNOSIS — J810 Acute pulmonary edema: Secondary | ICD-10-CM

## 2020-05-15 DIAGNOSIS — J9601 Acute respiratory failure with hypoxia: Secondary | ICD-10-CM

## 2020-05-15 DIAGNOSIS — Z20822 Contact with and (suspected) exposure to covid-19: Secondary | ICD-10-CM | POA: Insufficient documentation

## 2020-05-15 DIAGNOSIS — N189 Chronic kidney disease, unspecified: Secondary | ICD-10-CM

## 2020-05-15 DIAGNOSIS — I7782 Antineutrophilic cytoplasmic antibody (ANCA) vasculitis: Secondary | ICD-10-CM

## 2020-05-15 DIAGNOSIS — Z01812 Encounter for preprocedural laboratory examination: Secondary | ICD-10-CM | POA: Insufficient documentation

## 2020-05-15 DIAGNOSIS — J90 Pleural effusion, not elsewhere classified: Secondary | ICD-10-CM | POA: Diagnosis not present

## 2020-05-15 DIAGNOSIS — D649 Anemia, unspecified: Secondary | ICD-10-CM

## 2020-05-15 LAB — BASIC METABOLIC PANEL
BUN: 87 mg/dL (ref 6–23)
CO2: 31 mEq/L (ref 19–32)
Calcium: 8 mg/dL — ABNORMAL LOW (ref 8.4–10.5)
Chloride: 91 mEq/L — ABNORMAL LOW (ref 96–112)
Creatinine, Ser: 6.19 mg/dL (ref 0.40–1.20)
GFR: 6.6 mL/min — CL (ref >60.00)
Glucose, Bld: 128 mg/dL — ABNORMAL HIGH (ref 70–99)
Potassium: 4.2 mEq/L (ref 3.5–5.1)
Sodium: 137 mEq/L (ref 135–145)

## 2020-05-15 LAB — SARS CORONAVIRUS 2 (TAT 6-24 HRS): SARS Coronavirus 2: NEGATIVE

## 2020-05-15 LAB — CBC
HCT: 34.1 % — ABNORMAL LOW (ref 36.0–46.0)
Hemoglobin: 11.2 g/dL — ABNORMAL LOW (ref 12.0–15.0)
MCHC: 32.8 g/dL (ref 30.0–36.0)
MCV: 96 fl (ref 78.0–100.0)
Platelets: 284 10*3/uL (ref 150.0–400.0)
RBC: 3.56 Mil/uL — ABNORMAL LOW (ref 3.87–5.11)
RDW: 20 % — ABNORMAL HIGH (ref 11.5–15.5)
WBC: 12.2 10*3/uL — ABNORMAL HIGH (ref 4.0–10.5)

## 2020-05-15 NOTE — Progress Notes (Signed)
Reviewed and agree with assessment/plan.   Chesley Mires, MD [facility] Pulmonary/Critical Care 05/15/2020, 1:36 PM Pager:  [phone]

## 2020-05-15 NOTE — Progress Notes (Addendum)
@Patient  ID: Jasmine White, female    DOB: [birth year], 74 y.o.   MRN: [id number]  Chief Complaint  Patient presents with  . Hospitalization Follow-up    Referring provider: Chevis Pretty, *  HPI: 74 year old female, never smoked.  Past medical history significant for hypertension, ANCA vasculitis, hospital-acquired pneumonia, acute pulmonary edema, prior Covid infection, acute respiratory failure with hypoxia, hypothyroidism, secondary hyperparathyroidism, coagulation defect, renal failure on HD, iron deficiency anemia, chronic back pain, depression, protein calorie malnutrition.    Patient recently hospitalized 04/16/20-05/03/20 for acute respiratory failure with hypoxia in the setting of acute pulmonary edema plus pneumonia plus pleural effusion.  CT chest confirms bilateral patchy consolidation with right effusion.  Patient required intubation from 7/15-7/18/2021.  Patient treated with broad-spectrum antibiotics for pneumonia (vancomycin, cefepime and Cipro).  Continue P JP prophylactic Bactrim Monday Wednesday Friday.  Patient is on hemodialysis Monday Wednesday Friday for end-stage renal disease.  She is maintained on Cytoxan and prednisone for ANCA vasculitis.  Rapid improvement on dialysis seems to favor pulmonary edema.  If hemoptysis continues may need to increase immunosuppressant on.  05/15/2020 - Interim hx Patient presents today for hospital follow-up.  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  Respiratory failure likely due to pulmonary edema, possible pneumonia and vasculitis.  Antibiotics were subsequently discontinued.  She significantly improved with 7 treatments of plasmapheresis.  Hemoptysis resolved and patient is on room air.  She is currently on prednisone 60mg  daily, taper by nephrology over 6 months. He has not yet seen nephrology since hospitalization. She is off oxygen. Shortness of breath is better since  hospitalization. She has some intermittent wheezing mostly when she lies flat. She has been using 15% incline pillow at night. Her breathing actually improves with activity. She is getting physical therapy once a week. She has anxiety with associated panic attacks. Managed by PCP. She is on Elavil 50mg  at bedtime. Husband states that she is sleeping pretty well at night. Her appetite is ok. She has stopped Cytoxan (removed from medication list). She received total of two doses of Rituxan. Denies fever, cough, chest tightness/pain.   Imaging: 7/26 HRCT> bilateral pleural effusions, R>L. Bilateral ASD, suspicious for PNA and pulmonary edema. Interlobular septal thickening.   Testing: 7/26 SARS Cov2> neg 7/26 RVP > not done 7/26 Sputum cx> > not done  Microbials: 7/26 Vanc> 7/26 Cefepime>  7/26 cipro > 7/28 7/26 bactrim per pharm>   Allergies  Allergen Reactions  . Hctz [Hydrochlorothiazide] Other (See Comments)    Hx of severe Hyponatremia while taking HCTZ - now contraindicated  . Percocet [Oxycodone-Acetaminophen] Nausea Only  . Ultram [Tramadol] Nausea Only    Makes her crazy and nauseated    Immunization History  Administered Date(s) Administered  . Fluad Quad(high Dose 65+) 07/07/2019  . Influenza, High Dose Seasonal PF 06/26/2017, 06/30/2018  . Influenza,inj,Quad PF,6+ Mos 06/29/2013, 07/06/2014, 06/22/2015  . Moderna SARS-COVID-2 Vaccination 11/24/2019, 12/30/2019  . Pneumococcal Conjugate-13 06/29/2015  . Pneumococcal Polysaccharide-23 12/21/2012  . Tdap 12/19/2010    Past Medical History:  Diagnosis Date  . Arthritis   . Bruises easily   . Cancer ([place])    basal skin cancer  . Chronic back pain    HNP/stenosis and radiculopathy  . Hyperlipidemia    takes Pravastatin daily  . Hypertension    takes Hyzaar and toprol daily  . Hypothyroidism    takes Synthroid daily  . Insomnia    takes Elevil nightly  .  Joint pain   . Joint swelling   . Low BP    past some  sedation  . Nocturia   . PONV (postoperative nausea and vomiting)    with knee replacement b/p dropped  . Sinusitis    finished zpak yesterday  . Urinary frequency     Tobacco History: Social History   Tobacco Use  Smoking Status Never Smoker  Smokeless Tobacco Never Used   Counseling given: Not Answered   Outpatient Medications Prior to Visit  Medication Sig Dispense Refill  . albuterol (VENTOLIN HFA) 108 (90 Base) MCG/ACT inhaler Inhale 2 puffs into the lungs every 6 (six) hours as needed for wheezing or shortness of breath. 8 g 0  . amitriptyline (ELAVIL) 50 MG tablet Take 1 tablet (50 mg total) by mouth at bedtime. 90 tablet 1  . calcitRIOL (ROCALTROL) 0.25 MCG capsule Take 1 capsule (0.25 mcg total) by mouth every Monday, Wednesday, and Friday with hemodialysis. 30 capsule 0  . heparin 1000 unit/mL SOLN injection Heparin Sodium (Porcine) 1,000 Units/mL Catheter Lock Arterial    . Methoxy PEG-Epoetin Beta (MIRCERA IJ) Mircera    . metoprolol succinate (TOPROL-XL) 100 MG 24 hr tablet Take 1 tablet (100 mg total) by mouth daily. Take with or immediately following a meal. 90 tablet 1  . Multiple Vitamin (MULTI-VITAMIN DAILY PO) SMARTSIG:1 Tablet(s) By Mouth Daily    . multivitamin (RENA-VIT) TABS tablet Take 1 tablet by mouth at bedtime. 30 tablet 0  . omeprazole (PRILOSEC) 20 MG capsule Take 1 capsule (20 mg total) by mouth daily. 30 capsule 11  . predniSONE (DELTASONE) 20 MG tablet Take 3 tablets (60 mg total) by mouth daily.    Marland Kitchen sulfamethoxazole-trimethoprim (BACTRIM) 400-80 MG tablet Take 1 tablet by mouth every Monday, Wednesday, and Friday. 30 tablet 6  . SYNTHROID 88 MCG tablet TAKE 1 TABLET BY MOUTH ONCE DAILY BEFORE BREAKFAST (Patient taking differently: Take 88 mcg by mouth daily before breakfast. ) 90 tablet 1  . VITAMIN D PO Take 1 tablet by mouth daily.     . cyclophosphamide (CYTOXAN) 50 MG capsule Take by mouth.      No facility-administered medications prior to  visit.   Review of Systems  Review of Systems  Constitutional: Negative for appetite change and fever.  Respiratory: Positive for shortness of breath and wheezing. Negative for apnea, cough and chest tightness.   Cardiovascular: Negative.    Physical Exam  BP 120/70 (BP Location: Right Arm, Cuff Size: Normal)   Pulse 86   Temp (!) 96.4 F (35.8 C) (Temporal)   Ht 5\' 6"  (1.676 m)   Wt 127 lb 3.2 oz (57.7 kg)   SpO2 96%   BMI 20.53 kg/m  Physical Exam Constitutional:      Appearance: Normal appearance.     Comments: Thin female  Cardiovascular:     Rate and Rhythm: Normal rate and regular rhythm.  Pulmonary:     Effort: Pulmonary effort is normal.     Breath sounds: Normal breath sounds. No wheezing or rhonchi.  Neurological:     Mental Status: She is alert.  Psychiatric:        Mood and Affect: Mood normal.        Behavior: Behavior normal.        Thought Content: Thought content normal.        Judgment: Judgment normal.      Lab Results:  CBC    Component Value Date/Time   WBC 8.9 05/03/2020  0032   RBC 3.02 (L) 05/03/2020 0032   HGB 9.4 (L) 05/03/2020 0032   HGB 9.7 (L) 03/01/2020 0927   HCT 29.3 (L) 05/03/2020 0032   HCT 28.8 (L) 03/01/2020 0927   PLT 147 (L) 05/03/2020 0032   PLT 397 03/01/2020 0927   MCV 97.0 05/03/2020 0032   MCV 79 03/01/2020 0927   MCH 31.1 05/03/2020 0032   MCHC 32.1 05/03/2020 0032   RDW 17.9 (H) 05/03/2020 0032   RDW 14.7 03/01/2020 0927   LYMPHSABS 1.2 05/03/2020 0032   LYMPHSABS 1.3 03/01/2020 0927   MONOABS 0.7 05/03/2020 0032   EOSABS 0.1 05/03/2020 0032   EOSABS 0.2 03/01/2020 0927   BASOSABS 0.0 05/03/2020 0032   BASOSABS 0.1 03/01/2020 0927    BMET    Component Value Date/Time   NA 137 05/03/2020 0032   NA 128 (L) 03/01/2020 0927   K 3.9 05/03/2020 0032   CL 100 05/03/2020 0032   CO2 26 05/03/2020 0032   GLUCOSE 109 (H) 05/03/2020 0032   BUN 33 (H) 05/03/2020 0032   BUN 67 (H) 03/01/2020 0927   CREATININE  3.73 (H) 05/03/2020 0032   CREATININE 0.84 12/21/2012 1552   CALCIUM 7.9 (L) 05/03/2020 0032   GFRNONAA 11 (L) 05/03/2020 0032   GFRNONAA 72 12/21/2012 1552   GFRAA 13 (L) 05/03/2020 0032   GFRAA 83 12/21/2012 1552    BNP    Component Value Date/Time   BNP 2,852.3 (H) 04/05/2020 1638    ProBNP No results found for: PROBNP  Imaging: DG Chest 1 View  Result Date: 04/19/2020 CLINICAL DATA:  Shortness of breath EXAM: CHEST  1 VIEW COMPARISON:  04/18/2020. FINDINGS: Dual-lumen catheter with tip over SVC in stable position. Heart size stable. Progressive severe diffuse bilateral pulmonary infiltrates/edema. No prominent pleural effusion. No pneumothorax. IMPRESSION: 1.  Dual-lumen catheter noted with tip over SVC in stable position. 2. Progressive severe diffuse bilateral pulmonary infiltrates/edema. Electronically Signed   By: Marcello Moores  Register   On: 04/19/2020 07:46   DG Chest 2 View  Result Date: 05/15/2020 CLINICAL DATA:  Pulmonary edema or pneumonia. EXAM: CHEST - 2 VIEW COMPARISON:  April 29, 2020. FINDINGS: The heart size and mediastinal contours are within normal limits. No pneumothorax or pleural effusion is noted. Right internal jugular dialysis catheter is unchanged. Minimal bibasilar densities are noted concerning for pulmonary edema or possibly atypical inflammation. The visualized skeletal structures are unremarkable. IMPRESSION: Minimal bibasilar pulmonary edema or possibly atypical inflammation. Electronically Signed   By: Marijo Conception M.D.   On: 05/15/2020 11:29   DG Chest 2 View  Result Date: 04/18/2020 CLINICAL DATA:  Chest pain and shortness of breath EXAM: CHEST - 2 VIEW COMPARISON:  April 16, 2020 chest radiograph and chest CT FINDINGS: Widespread multifocal airspace opacity persists. No new opacities are evident. Heart size and pulmonary vascularity are normal. No adenopathy. Central catheter tip is in the superior vena cava near the cavoatrial junction. No  pneumothorax. There is mild anterior wedging of the L1 vertebral body. IMPRESSION: Multifocal airspace opacity, likely multifocal pneumonia. No new opacity evident. Stable cardiac silhouette. Central catheter as described. No evident pneumothorax. Electronically Signed   By: Lowella Grip III M.D.   On: 04/18/2020 08:24   DG Chest 2 View  Result Date: 04/16/2020 CLINICAL DATA:  Shortness of breath, chest pain EXAM: CHEST - 2 VIEW COMPARISON:  70 20 FINDINGS: Interval extubation G2. Right dialysis catheter remains in place, unchanged. Sensitive patchy bilateral airspace opacities are  noted, new since prior study compatible with multifocal pneumonia. No effusions. Heart is normal size. IMPRESSION: Extensive patchy bilateral airspace disease compatible with multifocal pneumonia. Electronically Signed   By: Rolm Baptise M.D.   On: 04/16/2020 08:34   CT Chest High Resolution  Result Date: 04/16/2020 CLINICAL DATA:  Concern for ILD, severe shortness of breath and cough for several weeks EXAM: CT CHEST WITHOUT CONTRAST TECHNIQUE: Multidetector CT imaging of the chest was performed following the standard protocol without intravenous contrast. High resolution imaging of the lungs, as well as inspiratory and expiratory imaging, was performed. COMPARISON:  CT chest, 04/05/2020 FINDINGS: Cardiovascular: Large-bore right neck multi lumen vascular catheter. Aortic atherosclerosis. Normal heart size. Scattered three-vessel coronary artery calcifications. No pericardial effusion. Mediastinum/Nodes: No enlarged mediastinal, hilar, or axillary lymph nodes. Thyroid gland, trachea, and esophagus demonstrate no significant findings. Lungs/Pleura: Moderate right, small left pleural effusions are slightly decreased compared to prior examination dated 04/05/2020. There is very extensive, dense, somewhat geographic heterogeneous and consolidative airspace disease throughout the lungs, which is significantly worsened compared to  prior examination. There is interlobular septal thickening throughout, which is similar to prior examination. Upper Abdomen: No acute abnormality. Musculoskeletal: No chest wall mass or suspicious bone lesions identified. IMPRESSION: 1. There is very extensive, dense, somewhat geographic heterogeneous and consolidative airspace disease throughout the lungs, which is significantly worsened compared to prior examination dated 04/05/2020. There is interlobular septal thickening throughout, which is similar to prior examination. Findings are most consistent with worsened multifocal infection, with underlying edema. 2. Moderate right, small left pleural effusions are slightly decreased compared to prior examination dated 04/05/2020. 3. There are no obvious features of interstitial lung disease, however examination is very limited by the presence of extensive acute airspace opacity and pleural effusions. Follow-up examination could be performed at the resolution of acute presentation. 4. Coronary artery disease.  Aortic Atherosclerosis (ICD10-I70.0). Electronically Signed   By: Eddie Candle M.D.   On: 04/16/2020 11:34   DG CHEST PORT 1 VIEW  Result Date: 04/29/2020 CLINICAL DATA:  Chest pain and shortness of breath.  Hemodialysis. EXAM: PORTABLE CHEST 1 VIEW COMPARISON:  04/19/2020 FINDINGS: RIGHT-sided dialysis catheter tips overlie the level of superior vena cava. Heart size is enlarged and partially obscured. There are bilateral pleural effusions. There has been significant improvement in aeration of the lungs bilaterally. Patchy airspace filling opacities persist throughout the lungs. IMPRESSION: Significant improvement in aeration of the lungs. Small bilateral pleural effusions. Electronically Signed   By: [name] M.D.   On: 04/29/2020 12:15   VAS US DUPLEX DIALYSIS ACCESS (AVF, AVG)  Result Date: 05/01/2020 DIALYSIS ACCESS Reason for Exam: Routine follow up. Access Site: Left Upper Extremity. Access  Type: Brachial-cephalic AVF. History: Left brachial-cephalic AVF placed 03/28/2375. Comparison Study: No prior study Performing Technologist: Maudry Mayhew MHA, RDMS, RVT, RDCS  Examination Guidelines: A complete evaluation includes B-mode imaging, spectral Doppler, color Doppler, and power Doppler as needed of all accessible portions of each vessel. Unilateral testing is considered an integral part of a complete examination. Limited examinations for reoccurring indications may be performed as noted.  Findings: +--------------------+----------+-----------------+--------+ AVF                 PSV (cm/s)Flow Vol (mL/min)Comments +--------------------+----------+-----------------+--------+ Native artery inflow   113           361                +--------------------+----------+-----------------+--------+ AVF Anastomosis        192                              +--------------------+----------+-----------------+--------+  +------------+---------+-------------+---------+------------------------------+  OUTFLOW VEIN   PSV   Diameter (cm)  Depth             Describe                         (cm/s)                 (cm)                                  +------------+---------+-------------+---------+------------------------------+ Prox UA        82        0.41       0.66                                  +------------+---------+-------------+---------+------------------------------+ Mid UA         80        0.43       0.42          competing branch        +------------+---------+-------------+---------+------------------------------+ Dist UA        670       0.10       0.43      narrowing with possible                                                           thrombus            +------------+---------+-------------+---------+------------------------------+ AC Fossa       140       0.49       0.24                                   +------------+---------+-------------+---------+------------------------------+   Summary: Arteriovenous fistula- Focal elevated velocities in the distal upper arm with possible thrombus. *See table(s) above for measurements and observations.  Diagnosing physician: Harold Barban MD Electronically signed by Harold Barban MD on 05/01/2020 at 10:22:14 PM.   --------------------------------------------------------------------------------   Final      Assessment & Plan:   ANCA-associated vasculitis ([place]) - Spoke with patient's daughter, she has stopped Cytoxan. She received her second dose of Rituxan on 05/03/2020.  - Plan continue Bactrim DS MWF and prednisone 60mg  daily (taper per nephrology) - CXR today showed minimal bibasilar pulmonary edema vs atypical inflammation. Needs repeat CT imaging in 4-6 weeks to assess improvement of pulmonary infiltrates - Patient to follow-up with nephrology in 1 week/as soon as possible    Acute pulmonary edema ([place]) - Patient is on HD MWF  Acute respiratory failure with hypoxia [facility]) - Resolved; Recent admission in July 26th -Aug 12th for resp failure d.t pulmonary edema, pneumonia and vasculitis. She needs to follow up with nephrology. CXR today showed minimal bibasilar pulmonary edema vs atypical inflammation. Orderd CT chest in 4 weeks  Anemia, unspecified - Checking CBC today to follow-up from hospital stay   Renal failure - Continues HD. She will be having AV fistula placed later this week - Checking BMET to follow-up from hospital stay   Martyn Ehrich, NP 05/15/2020

## 2020-05-15 NOTE — Assessment & Plan Note (Signed)
-   Patient is on HD MWF

## 2020-05-15 NOTE — Assessment & Plan Note (Addendum)
-   Continues HD. She will be having AV fistula placed later this week - Checking BMET to follow-up from hospital stay

## 2020-05-15 NOTE — Patient Instructions (Addendum)
Recommendations: - Continue Dialysis MWF - Continue Bactrim MWF  - Stay on prednisone 60mg  daily, taper per nephrology recommendations - Per hospital discharge note you were supposed to stop Cytoxan and continue Rituxan. You received your second dose of Rituxan on 05/03/2020 (please see nephrology to discuss medication)  ORDERS: - Labs CBC/BMET - CXR today re: fu pulmonary edema  Follow- up: - Please contact nephrology for hospital follow-up as soon as possible needs to be seen with Donato Heinz, MD - You have an apt with Dr. Halford Chessman on September 17th; keep apt if needed

## 2020-05-15 NOTE — Assessment & Plan Note (Addendum)
-   Spoke with patient's daughter, she has stopped Cytoxan. She received her second dose of Rituxan on 05/03/2020.  - Plan continue Bactrim DS MWF and prednisone 60mg  daily (taper per nephrology) - CXR today showed minimal bibasilar pulmonary edema vs atypical inflammation. Needs repeat CT imaging in 4-6 weeks to assess improvement of pulmonary infiltrates - Patient to follow-up with nephrology in 1 week/as soon as possible

## 2020-05-15 NOTE — Addendum Note (Signed)
Addended by: Martyn Ehrich on: 05/15/2020 01:11 PM   Modules accepted: Orders

## 2020-05-15 NOTE — Telephone Encounter (Signed)
Patient's daughter notified of recent lab results. She is reporting that Ms. Zunker will see her nephrology doctor and have dialysis on 05/16/2020. She understands the emergency plan made by NP [facility].

## 2020-05-15 NOTE — Telephone Encounter (Signed)
ATC, left VM for patient to return call concerning labs drawn today.

## 2020-05-15 NOTE — Telephone Encounter (Signed)
She has know renal failure on HD. I would advise she called nephrology today and let know know she had labs that were done today and kidney function is worse. Her vitals were stable and she looked ok. If she develops worsening shortness of breath, hypotension, dizziness needs to go to ED

## 2020-05-15 NOTE — Assessment & Plan Note (Signed)
-   Checking CBC today to follow-up from hospital stay

## 2020-05-15 NOTE — Assessment & Plan Note (Addendum)
-   Resolved; Recent admission in July 26th -Aug 12th for resp failure d.t pulmonary edema, pneumonia and vasculitis. She needs to follow up with nephrology. CXR today showed minimal bibasilar pulmonary edema vs atypical inflammation. Orderd CT chest in 4 weeks

## 2020-05-15 NOTE — Telephone Encounter (Signed)
Received call report on critical labs from [place] at [facility].  BUN: 87 Creat: 6.19 GFR: 6.60  Beth, please advise.

## 2020-05-16 DIAGNOSIS — I120 Hypertensive chronic kidney disease with stage 5 chronic kidney disease or end stage renal disease: Secondary | ICD-10-CM | POA: Diagnosis not present

## 2020-05-16 DIAGNOSIS — I2510 Atherosclerotic heart disease of native coronary artery without angina pectoris: Secondary | ICD-10-CM | POA: Diagnosis not present

## 2020-05-16 DIAGNOSIS — M47816 Spondylosis without myelopathy or radiculopathy, lumbar region: Secondary | ICD-10-CM | POA: Diagnosis not present

## 2020-05-16 DIAGNOSIS — J9601 Acute respiratory failure with hypoxia: Secondary | ICD-10-CM | POA: Diagnosis not present

## 2020-05-16 DIAGNOSIS — M4800 Spinal stenosis, site unspecified: Secondary | ICD-10-CM | POA: Diagnosis not present

## 2020-05-16 DIAGNOSIS — N2581 Secondary hyperparathyroidism of renal origin: Secondary | ICD-10-CM | POA: Diagnosis not present

## 2020-05-16 DIAGNOSIS — Z992 Dependence on renal dialysis: Secondary | ICD-10-CM | POA: Diagnosis not present

## 2020-05-16 DIAGNOSIS — D631 Anemia in chronic kidney disease: Secondary | ICD-10-CM | POA: Diagnosis not present

## 2020-05-16 DIAGNOSIS — I288 Other diseases of pulmonary vessels: Secondary | ICD-10-CM | POA: Diagnosis not present

## 2020-05-16 DIAGNOSIS — F39 Unspecified mood [affective] disorder: Secondary | ICD-10-CM | POA: Diagnosis not present

## 2020-05-16 DIAGNOSIS — N186 End stage renal disease: Secondary | ICD-10-CM | POA: Diagnosis not present

## 2020-05-17 ENCOUNTER — Other Ambulatory Visit: Payer: Self-pay

## 2020-05-17 ENCOUNTER — Ambulatory Visit (HOSPITAL_COMMUNITY)
Admission: RE | Admit: 2020-05-17 | Discharge: 2020-05-17 | Disposition: A | Discharge status: Home / Self Care | Payer: Medicare PPO | Attending: Vascular Surgery | Admitting: Vascular Surgery

## 2020-05-17 ENCOUNTER — Ambulatory Visit (HOSPITAL_COMMUNITY)
Admission: RE | Disposition: A | Discharge status: Home / Self Care | Payer: Self-pay | Source: Home / Self Care | Attending: Vascular Surgery

## 2020-05-17 ENCOUNTER — Encounter (HOSPITAL_COMMUNITY): Payer: Self-pay | Admitting: Vascular Surgery

## 2020-05-17 DIAGNOSIS — N186 End stage renal disease: Secondary | ICD-10-CM | POA: Diagnosis not present

## 2020-05-17 DIAGNOSIS — Z7952 Long term (current) use of systemic steroids: Secondary | ICD-10-CM | POA: Diagnosis not present

## 2020-05-17 DIAGNOSIS — Z888 Allergy status to other drugs, medicaments and biological substances status: Secondary | ICD-10-CM | POA: Diagnosis not present

## 2020-05-17 DIAGNOSIS — Z992 Dependence on renal dialysis: Secondary | ICD-10-CM | POA: Diagnosis not present

## 2020-05-17 DIAGNOSIS — T82858A Stenosis of vascular prosthetic devices, implants and grafts, initial encounter: Secondary | ICD-10-CM | POA: Diagnosis not present

## 2020-05-17 DIAGNOSIS — Z885 Allergy status to narcotic agent status: Secondary | ICD-10-CM | POA: Insufficient documentation

## 2020-05-17 DIAGNOSIS — Z79899 Other long term (current) drug therapy: Secondary | ICD-10-CM | POA: Diagnosis not present

## 2020-05-17 DIAGNOSIS — Y832 Surgical operation with anastomosis, bypass or graft as the cause of abnormal reaction of the patient, or of later complication, without mention of misadventure at the time of the procedure: Secondary | ICD-10-CM | POA: Insufficient documentation

## 2020-05-17 DIAGNOSIS — Z79890 Hormone replacement therapy (postmenopausal): Secondary | ICD-10-CM | POA: Diagnosis not present

## 2020-05-17 DIAGNOSIS — T82898A Other specified complication of vascular prosthetic devices, implants and grafts, initial encounter: Secondary | ICD-10-CM | POA: Diagnosis not present

## 2020-05-17 HISTORY — PX: PERIPHERAL VASCULAR BALLOON ANGIOPLASTY: CATH118281

## 2020-05-17 HISTORY — PX: A/V FISTULAGRAM: CATH118298

## 2020-05-17 LAB — POCT I-STAT, CHEM 8
BUN: 78 mg/dL — ABNORMAL HIGH (ref 8–23)
Calcium, Ion: 1 mmol/L — ABNORMAL LOW (ref 1.15–1.40)
Chloride: 89 mmol/L — ABNORMAL LOW (ref 98–111)
Creatinine, Ser: 4.9 mg/dL — ABNORMAL HIGH (ref 0.44–1.00)
Glucose, Bld: 101 mg/dL — ABNORMAL HIGH (ref 70–99)
HCT: 36 % (ref 36.0–46.0)
Hemoglobin: 12.2 g/dL (ref 12.0–15.0)
Potassium: 3.8 mmol/L (ref 3.5–5.1)
Sodium: 133 mmol/L — ABNORMAL LOW (ref 135–145)
TCO2: 30 mmol/L (ref 22–32)

## 2020-05-17 SURGERY — A/V FISTULAGRAM
Anesthesia: LOCAL

## 2020-05-17 MED ORDER — LIDOCAINE HCL (PF) 1 % IJ SOLN
INTRAMUSCULAR | Status: AC
  Filled 2020-05-17: qty 30

## 2020-05-17 MED ORDER — HEPARIN SODIUM (PORCINE) 1000 UNIT/ML IJ SOLN
INTRAMUSCULAR | Status: DC | PRN
  Administered 2020-05-17: 2000 [IU] via INTRAVENOUS
  Administered 2020-05-17: 3000 [IU] via INTRAVENOUS

## 2020-05-17 MED ORDER — FENTANYL CITRATE (PF) 100 MCG/2ML IJ SOLN
INTRAMUSCULAR | Status: DC | PRN
Start: 2020-05-17 — End: 2020-05-17
  Administered 2020-05-17: 25 ug via INTRAVENOUS

## 2020-05-17 MED ORDER — IODIXANOL 320 MG/ML IV SOLN
INTRAVENOUS | Status: DC | PRN
  Administered 2020-05-17: 120 mL via INTRAVENOUS

## 2020-05-17 MED ORDER — SODIUM CHLORIDE 0.9% FLUSH: 3.0000 mL | Freq: Two times a day (BID) | INTRAVENOUS | Status: DC

## 2020-05-17 MED ORDER — HEPARIN (PORCINE) IN NACL 1000-0.9 UT/500ML-% IV SOLN
INTRAVENOUS | Status: DC | PRN
  Administered 2020-05-17: 500 mL

## 2020-05-17 MED ORDER — HEPARIN (PORCINE) IN NACL 1000-0.9 UT/500ML-% IV SOLN
INTRAVENOUS | Status: AC
  Filled 2020-05-17: qty 500

## 2020-05-17 MED ORDER — LIDOCAINE HCL (PF) 1 % IJ SOLN
INTRAMUSCULAR | Status: DC | PRN
  Administered 2020-05-17: 2 mL via INTRADERMAL

## 2020-05-17 MED ORDER — SODIUM CHLORIDE 0.9 % IV SOLN: 250.0000 mL | INTRAVENOUS | Status: DC | PRN

## 2020-05-17 MED ORDER — SODIUM CHLORIDE 0.9% FLUSH: 3.0000 mL | INTRAVENOUS | Status: DC | PRN

## 2020-05-17 MED ORDER — FENTANYL CITRATE (PF) 100 MCG/2ML IJ SOLN
INTRAMUSCULAR | Status: AC
  Filled 2020-05-17: qty 2

## 2020-05-17 MED ORDER — HEPARIN (PORCINE) IN NACL 2000-0.9 UNIT/L-% IV SOLN
INTRAVENOUS | Status: AC
  Filled 2020-05-17: qty 1000

## 2020-05-17 SURGICAL SUPPLY — 32 items
BAG SNAP BAND KOVER 36X36 (MISCELLANEOUS) ×3 IMPLANT
BALLN ATLAS 14X40X75 (BALLOONS) ×3
BALLN LUTONIX AV 12X40X75 (BALLOONS) ×3
BALLN MUSTANG 10X80X75 (BALLOONS) ×3
BALLN MUSTANG 12X80X75 (BALLOONS) ×6
BALLN MUSTANG 6X80X75 (BALLOONS) ×6
BALLN MUSTANG 7.0X20 75 (BALLOONS) ×6
BALLN MUSTANG 8.0X40 75 (BALLOONS) ×3
BALLN MUSTANG 8X80X75 (BALLOONS) ×6
BALLOON ATLAS 14X40X75 (BALLOONS) ×2 IMPLANT
BALLOON LUTONIX AV 12X40X75 (BALLOONS) ×2 IMPLANT
BALLOON MUSTANG 10X80X75 (BALLOONS) ×2 IMPLANT
BALLOON MUSTANG 12X80X75 (BALLOONS) ×4 IMPLANT
BALLOON MUSTANG 6X80X75 (BALLOONS) ×4 IMPLANT
BALLOON MUSTANG 7.0X20 75 (BALLOONS) ×4 IMPLANT
BALLOON MUSTANG 8.0X40 75 (BALLOONS) ×2 IMPLANT
BALLOON MUSTANG 8X80X75 (BALLOONS) ×4 IMPLANT
CATH BEACON 5 .035 65 KMP TIP (CATHETERS) ×3 IMPLANT
COVER DOME SNAP 22 D (MISCELLANEOUS) ×3 IMPLANT
KIT ENCORE 26 ADVANTAGE (KITS) ×3 IMPLANT
KIT MICROPUNCTURE NIT STIFF (SHEATH) ×3 IMPLANT
PROTECTION STATION PRESSURIZED (MISCELLANEOUS) ×3
SHEATH PINNACLE R/O II 6F 4CM (SHEATH) ×3 IMPLANT
SHEATH PINNACLE R/O II 7F 4CM (SHEATH) ×3 IMPLANT
SHEATH PROBE COVER 6X72 (BAG) ×3 IMPLANT
STATION PROTECTION PRESSURIZED (MISCELLANEOUS) ×2 IMPLANT
STOPCOCK MORSE 400PSI 3WAY (MISCELLANEOUS) ×3 IMPLANT
TRAY PV CATH (CUSTOM PROCEDURE TRAY) ×3 IMPLANT
TUBING CIL FLEX 10 FLL-RA (TUBING) ×3 IMPLANT
WIRE AMPLATZ SS-J .035X180CM (WIRE) ×6 IMPLANT
WIRE BENTSON .035X145CM (WIRE) ×3 IMPLANT
WIRE ROSEN-J .035X180CM (WIRE) ×3 IMPLANT

## 2020-05-17 NOTE — Op Note (Signed)
OPERATIVE NOTE   PROCEDURE: 1. left brachiocephalic arteriovenous fistula cannulation under ultrasound guidance 2. left arm fistuogram including central venogram 3. left central angioplasty in innominate vein (6 mm x 80 mm Mustang, 8 mm x 80 mm Mustang, 10 mm x 80 mm Mustang, 12 mm x 80 mm Mustang, and 14 mm x 80 mm Atlas and 12 mm drug coated balloon) 4. left peripheral angioplasty in proximal upper arm cephalic vein (6 mm x 80 mm Mustang) and distal upper arm cephalic vein (6 mm x 80 mm  Mustang, 7 mm x 20 mm Mustang, and 8 mm x 40 mm Mustang)  PRE-OPERATIVE DIAGNOSIS: Malfunctioning left arteriovenous fistula  POST-OPERATIVE DIAGNOSIS: same as above   SURGEON: Marty Heck, MD  ANESTHESIA: local  ESTIMATED BLOOD LOSS: 5 cc  FINDING(S): 1. On initial evaluation the left brachiocephalic fistula was pulsatile.  Initial central venogram showed a high-grade central venous stenosis that was very tight likely greater than 99% with mostly collaterals filling the central system.  I was able to cross this lesion and ultimately angioplastied this with a 6 mm, 8 mm, 10 mm, 12 mm Mustang with significant recoil and no significant improvement.  Finally with a 14 mm Atlas I was able to get a flow channel and this was treated with drug-coated balloon.  There were two peripheral lesions one in the upper arm cephalic vein near the humeral head that was treated with a 6 mm Mustang and then a second lesion in the distal upper arm that was treated with a 6 mm and 7 mm Mustang with significant recoil and then used an 8 mm Mustang.  We did have extravasation here but this resolved with manual pressure.  Would recommend that she get to 12 weeks postop from initial placement by Dr. Donnetta Hutching before using.  Excellent thrill upon completion.  SPECIMEN(S):  None  CONTRAST: 120 cc  INDICATIONS: Jasmine White is a 74 y.o. female who  presents with malfunctioning left brachiocephalic arteriovenous  fistula.  The patient is scheduled for left arm fsitulogram.  The patient is aware the risks include but are not limited to: bleeding, infection, thrombosis of the cannulated access, and possible anaphylactic reaction to the contrast.  The patient is aware of the risks of the procedure and elects to proceed forward.  DESCRIPTION: After full informed written consent was obtained, the patient was brought back to the angiography suite and placed supine upon the angiography table.  The patient was connected to monitoring equipment.  The left arm was prepped and draped in the standard fashion for a left arm fistulogram.  Under ultrasound guidance, the left arteriovenous fistula was evaluated, it was patent, an image was saved.  It was cannulated with a micropuncture needle under ultrasound guidance.  The microwire was advanced into the fistula and the needle was exchanged for the a microsheath, which was lodged 2 cm into the access.  The wire was removed and the sheath was connected to the IV extension tubing.  Hand injections were completed to image the access from the antecubitum up to the level of axilla.  The central venous structures were also imaged by hand injections.  Patient had multiple issues with the fistula as noted above.  Placed a Bentson wire and then exchanged for a short 6 [place] sheath.  Patient was given 3000 units of IV heparin.  Initially used a Bentson wire with a KMP to cross the central venous stenosis in the left innominate vein and  confirmed on hand-injection we were in the true lumen.  This was a very tight lesion with mostly collaterals filling in the chest.  Initially used a 6 mm then 8 mm than 10 mm and then 12 mm Mustang with no significant provement and a lot of recoil.  I then gave her 2000 units of additional IV heparin and upsized to a 14 mm Atlas and she had significant pain and we were finally able to get a good flow channel without as much recoil.  This was then treated with a 12  mm drug-coated impact which was the largest drug balloon we had.  During this process of exchanging we did exchange for Amplatz wire for more support given the balloons would not track over the [employer] wire.  In the process we did treat a upper arm cephalic vein stenosis approximately 50% with a 6 mm Mustang to nominal pressure for 2 minutes.  Finally we treated the distal upper arm stenosis of approximately 70 to 80% with a 6 mm and 7 mm Mustang to nominal pressure for 2 minutes and again with significant recoil.  I then upsized to an 8 mm Mustang to nominal pressure for 2 minutes and there was no significant recoil with a good thrill.  There was some extravasation here but this was treated with manual pressure.  Wires and catheters were removed. A 4-0 Monocryl purse-string suture was sewn around the sheath.  The sheath was removed while tying down the suture.  A sterile bandage was applied to the puncture site.  COMPLICATIONS: None  CONDITION: Stable  Marty Heck, MD [facility] Office: [phone]  05/17/2020 11:06 AM

## 2020-05-17 NOTE — Discharge Instructions (Signed)

## 2020-05-17 NOTE — H&P (Signed)
History and Physical Interval Note:  05/17/2020 8:19 AM  Jasmine White  has presented today for surgery, with the diagnosis of instage renal.  The various methods of treatment have been discussed with the patient and family. After consideration of risks, benefits and other options for treatment, the patient has consented to  Procedure(s): A/V FISTULAGRAM - Left Arm (N/A) as a surgical intervention.  The patient's history has been reviewed, patient examined, no change in status, stable for surgery.  I have reviewed the patient's chart and labs.  Questions were answered to the patient's satisfaction.    Left arm fistulogram.  Marty Heck  CC:  F/u for surgery  HPI:  This is a 74 y.o. female who is s/p Left brachiocephalic AV fistula creation on 04/02/20 by Dr. Donnetta Hutching.    Pt returns today for follow up.              She denise left hand pain, loss of sensation or loss of strength.  She is currently on HD MWF.  She has a right TDC placed by IR.         Allergies  Allergen Reactions  . Hctz [Hydrochlorothiazide] Other (See Comments)    Hx of severe Hyponatremia while taking HCTZ - now contraindicated  . Percocet [Oxycodone-Acetaminophen] Nausea Only  . Ultram [Tramadol] Nausea Only    Makes her crazy and nauseated          Current Outpatient Medications  Medication Sig Dispense Refill  . albuterol (VENTOLIN HFA) 108 (90 Base) MCG/ACT inhaler Inhale 2 puffs into the lungs every 6 (six) hours as needed for wheezing or shortness of breath. 8 g 0  . amitriptyline (ELAVIL) 50 MG tablet Take 1 tablet (50 mg total) by mouth at bedtime. 90 tablet 1  . calcitRIOL (ROCALTROL) 0.25 MCG capsule Take 1 capsule (0.25 mcg total) by mouth every Monday, Wednesday, and Friday with hemodialysis. 30 capsule 0  . cholecalciferol (VITAMIN D) 25 MCG tablet Take 1 tablet (1,000 Units total) by mouth daily. 30 tablet 6  . metoprolol succinate (TOPROL-XL) 100 MG 24 hr tablet Take 1 tablet (100  mg total) by mouth daily. Take with or immediately following a meal. 90 tablet 1  . multivitamin (RENA-VIT) TABS tablet Take 1 tablet by mouth at bedtime. 30 tablet 0  . omeprazole (PRILOSEC) 20 MG capsule Take 1 capsule (20 mg total) by mouth daily. 30 capsule 11  . predniSONE (DELTASONE) 20 MG tablet Take 3 tablets (60 mg total) by mouth daily.    Marland Kitchen sulfamethoxazole-trimethoprim (BACTRIM) 400-80 MG tablet Take 1 tablet by mouth every Monday, Wednesday, and Friday. 30 tablet 6  . SYNTHROID 88 MCG tablet TAKE 1 TABLET BY MOUTH ONCE DAILY BEFORE BREAKFAST (Patient taking differently: Take 88 mcg by mouth daily before breakfast. ) 90 tablet 1   No current facility-administered medications for this visit.     ROS:  See HPI  Physical Exam: Findings:  +--------------------+----------+-----------------+--------+  AVF         PSV (cm/s)Flow Vol (mL/min)Comments  +--------------------+----------+-----------------+--------+  Native artery inflow  113      361          +--------------------+----------+-----------------+--------+  AVF Anastomosis     192                 +--------------------+----------+-----------------+--------+     +------------+---------+-------------+---------+---------------------------  ---+  OUTFLOW VEIN  PSV  Diameter (cm) Depth       Describe                (  cm/s)         (cm)                    +------------+---------+-------------+---------+---------------------------  ---+  Prox UA     82    0.41    0.66                    +------------+---------+-------------+---------+---------------------------  ---+  Mid UA     80    0.43    0.42      competing branch       +------------+---------+-------------+---------+---------------------------  ---+  Dist UA     670    0.10    0.43     narrowing with possible                                  thrombus         +------------+---------+-------------+---------+---------------------------  ---+  AC Fossa    140    0.49    0.24                    +------------+---------+-------------+---------+---------------------------  ---+    Incision:  Incision has healed well Extremities:  Palpable radial pulses, grip 5/5 and sensation intact left hand. Lungs: Non labored breathing     Assessment/Plan:  This is a 74 y.o. female who is s/p: Left BC fistula creation 4 weeks out.             There is a narrowed area in the fistula distally and palpation feels pulsatile.    We will order a fistulogram with possible intervention.  If the cephalic fistula fails she does have an acceptable basilic vein we can use.  She and her husband agree with this plan.  Roxy Horseman PA-C [facility] [phone]  Clinic MD:  Early

## 2020-05-18 ENCOUNTER — Encounter (HOSPITAL_COMMUNITY): Payer: Self-pay | Admitting: Vascular Surgery

## 2020-05-18 DIAGNOSIS — Z992 Dependence on renal dialysis: Secondary | ICD-10-CM | POA: Diagnosis not present

## 2020-05-18 DIAGNOSIS — N2581 Secondary hyperparathyroidism of renal origin: Secondary | ICD-10-CM | POA: Diagnosis not present

## 2020-05-18 DIAGNOSIS — N186 End stage renal disease: Secondary | ICD-10-CM | POA: Diagnosis not present

## 2020-05-18 MED FILL — Heparin Sod (Porcine)-NaCl IV Soln 1000 Unit/500ML-0.9%: INTRAVENOUS | Qty: 500 | Status: AC

## 2020-05-21 DIAGNOSIS — N186 End stage renal disease: Secondary | ICD-10-CM | POA: Diagnosis not present

## 2020-05-21 DIAGNOSIS — Z992 Dependence on renal dialysis: Secondary | ICD-10-CM | POA: Diagnosis not present

## 2020-05-21 DIAGNOSIS — N2581 Secondary hyperparathyroidism of renal origin: Secondary | ICD-10-CM | POA: Diagnosis not present

## 2020-05-22 DIAGNOSIS — F39 Unspecified mood [affective] disorder: Secondary | ICD-10-CM | POA: Diagnosis not present

## 2020-05-22 DIAGNOSIS — I120 Hypertensive chronic kidney disease with stage 5 chronic kidney disease or end stage renal disease: Secondary | ICD-10-CM | POA: Diagnosis not present

## 2020-05-22 DIAGNOSIS — M47816 Spondylosis without myelopathy or radiculopathy, lumbar region: Secondary | ICD-10-CM | POA: Diagnosis not present

## 2020-05-22 DIAGNOSIS — I2510 Atherosclerotic heart disease of native coronary artery without angina pectoris: Secondary | ICD-10-CM | POA: Diagnosis not present

## 2020-05-22 DIAGNOSIS — N179 Acute kidney failure, unspecified: Secondary | ICD-10-CM | POA: Diagnosis not present

## 2020-05-22 DIAGNOSIS — D631 Anemia in chronic kidney disease: Secondary | ICD-10-CM | POA: Diagnosis not present

## 2020-05-22 DIAGNOSIS — I288 Other diseases of pulmonary vessels: Secondary | ICD-10-CM | POA: Diagnosis not present

## 2020-05-22 DIAGNOSIS — Z992 Dependence on renal dialysis: Secondary | ICD-10-CM | POA: Diagnosis not present

## 2020-05-22 DIAGNOSIS — N186 End stage renal disease: Secondary | ICD-10-CM | POA: Diagnosis not present

## 2020-05-22 DIAGNOSIS — J9601 Acute respiratory failure with hypoxia: Secondary | ICD-10-CM | POA: Diagnosis not present

## 2020-05-22 DIAGNOSIS — M4800 Spinal stenosis, site unspecified: Secondary | ICD-10-CM | POA: Diagnosis not present

## 2020-05-22 LAB — ACID FAST CULTURE WITH REFLEXED SENSITIVITIES (MYCOBACTERIA): Acid Fast Culture: NEGATIVE

## 2020-05-23 DIAGNOSIS — N186 End stage renal disease: Secondary | ICD-10-CM | POA: Diagnosis not present

## 2020-05-23 DIAGNOSIS — N2581 Secondary hyperparathyroidism of renal origin: Secondary | ICD-10-CM | POA: Diagnosis not present

## 2020-05-23 DIAGNOSIS — Z992 Dependence on renal dialysis: Secondary | ICD-10-CM | POA: Diagnosis not present

## 2020-05-25 DIAGNOSIS — N2581 Secondary hyperparathyroidism of renal origin: Secondary | ICD-10-CM | POA: Diagnosis not present

## 2020-05-25 DIAGNOSIS — N186 End stage renal disease: Secondary | ICD-10-CM | POA: Diagnosis not present

## 2020-05-25 DIAGNOSIS — Z992 Dependence on renal dialysis: Secondary | ICD-10-CM | POA: Diagnosis not present

## 2020-05-28 DIAGNOSIS — Z992 Dependence on renal dialysis: Secondary | ICD-10-CM | POA: Diagnosis not present

## 2020-05-28 DIAGNOSIS — N186 End stage renal disease: Secondary | ICD-10-CM | POA: Diagnosis not present

## 2020-05-28 DIAGNOSIS — N2581 Secondary hyperparathyroidism of renal origin: Secondary | ICD-10-CM | POA: Diagnosis not present

## 2020-05-30 DIAGNOSIS — Z992 Dependence on renal dialysis: Secondary | ICD-10-CM | POA: Diagnosis not present

## 2020-05-30 DIAGNOSIS — N2581 Secondary hyperparathyroidism of renal origin: Secondary | ICD-10-CM | POA: Diagnosis not present

## 2020-05-30 DIAGNOSIS — N186 End stage renal disease: Secondary | ICD-10-CM | POA: Diagnosis not present

## 2020-05-31 DIAGNOSIS — L239 Allergic contact dermatitis, unspecified cause: Secondary | ICD-10-CM | POA: Diagnosis not present

## 2020-05-31 DIAGNOSIS — L821 Other seborrheic keratosis: Secondary | ICD-10-CM | POA: Diagnosis not present

## 2020-05-31 DIAGNOSIS — B009 Herpesviral infection, unspecified: Secondary | ICD-10-CM | POA: Diagnosis not present

## 2020-05-31 DIAGNOSIS — L0101 Non-bullous impetigo: Secondary | ICD-10-CM | POA: Diagnosis not present

## 2020-05-31 DIAGNOSIS — C44329 Squamous cell carcinoma of skin of other parts of face: Secondary | ICD-10-CM | POA: Diagnosis not present

## 2020-05-31 DIAGNOSIS — L570 Actinic keratosis: Secondary | ICD-10-CM | POA: Diagnosis not present

## 2020-05-31 DIAGNOSIS — D692 Other nonthrombocytopenic purpura: Secondary | ICD-10-CM | POA: Diagnosis not present

## 2020-05-31 DIAGNOSIS — Z85828 Personal history of other malignant neoplasm of skin: Secondary | ICD-10-CM | POA: Diagnosis not present

## 2020-05-31 DIAGNOSIS — D485 Neoplasm of uncertain behavior of skin: Secondary | ICD-10-CM | POA: Diagnosis not present

## 2020-05-31 DIAGNOSIS — L0100 Impetigo, unspecified: Secondary | ICD-10-CM | POA: Diagnosis not present

## 2020-06-01 DIAGNOSIS — N2581 Secondary hyperparathyroidism of renal origin: Secondary | ICD-10-CM | POA: Diagnosis not present

## 2020-06-01 DIAGNOSIS — Z992 Dependence on renal dialysis: Secondary | ICD-10-CM | POA: Diagnosis not present

## 2020-06-01 DIAGNOSIS — N186 End stage renal disease: Secondary | ICD-10-CM | POA: Diagnosis not present

## 2020-06-04 DIAGNOSIS — N186 End stage renal disease: Secondary | ICD-10-CM | POA: Diagnosis not present

## 2020-06-04 DIAGNOSIS — Z992 Dependence on renal dialysis: Secondary | ICD-10-CM | POA: Diagnosis not present

## 2020-06-04 DIAGNOSIS — N2581 Secondary hyperparathyroidism of renal origin: Secondary | ICD-10-CM | POA: Diagnosis not present

## 2020-06-05 ENCOUNTER — Telehealth: Payer: Self-pay

## 2020-06-05 ENCOUNTER — Telehealth: Payer: Self-pay | Admitting: Pulmonary Disease

## 2020-06-05 ENCOUNTER — Other Ambulatory Visit: Payer: Self-pay | Admitting: Pulmonary Disease

## 2020-06-05 DIAGNOSIS — N2581 Secondary hyperparathyroidism of renal origin: Secondary | ICD-10-CM | POA: Diagnosis not present

## 2020-06-05 DIAGNOSIS — I10 Essential (primary) hypertension: Secondary | ICD-10-CM | POA: Diagnosis not present

## 2020-06-05 DIAGNOSIS — F3341 Major depressive disorder, recurrent, in partial remission: Secondary | ICD-10-CM | POA: Diagnosis not present

## 2020-06-05 DIAGNOSIS — I776 Arteritis, unspecified: Secondary | ICD-10-CM | POA: Diagnosis not present

## 2020-06-05 DIAGNOSIS — D631 Anemia in chronic kidney disease: Secondary | ICD-10-CM | POA: Diagnosis not present

## 2020-06-05 DIAGNOSIS — K1379 Other lesions of oral mucosa: Secondary | ICD-10-CM | POA: Diagnosis not present

## 2020-06-05 DIAGNOSIS — R2681 Unsteadiness on feet: Secondary | ICD-10-CM | POA: Diagnosis not present

## 2020-06-05 DIAGNOSIS — E039 Hypothyroidism, unspecified: Secondary | ICD-10-CM | POA: Diagnosis not present

## 2020-06-05 DIAGNOSIS — N186 End stage renal disease: Secondary | ICD-10-CM | POA: Diagnosis not present

## 2020-06-05 NOTE — Telephone Encounter (Signed)
Spoke with patient's daughter regarding prior message.Pateitnt did not request a refill to be sent to our office. Patient will contact her renal provider for Rena-vit. Patient's daughter voice was understanding.Nothing else further needed.

## 2020-06-05 NOTE — Telephone Encounter (Signed)
This needs to be refilled by her PCP.

## 2020-06-05 NOTE — Telephone Encounter (Signed)
Carloyn Manner, CMA  Chesley Mires, MD 4 hours ago (11:03 AM)   Pt is requesting refill on nephro-vite tablet lov 05/15/20   Routing comment    LMTCB  X 1

## 2020-06-05 NOTE — Telephone Encounter (Signed)
See prior note from 9/14.

## 2020-06-06 DIAGNOSIS — Z992 Dependence on renal dialysis: Secondary | ICD-10-CM | POA: Diagnosis not present

## 2020-06-06 DIAGNOSIS — N186 End stage renal disease: Secondary | ICD-10-CM | POA: Diagnosis not present

## 2020-06-06 DIAGNOSIS — N2581 Secondary hyperparathyroidism of renal origin: Secondary | ICD-10-CM | POA: Diagnosis not present

## 2020-06-07 DIAGNOSIS — I120 Hypertensive chronic kidney disease with stage 5 chronic kidney disease or end stage renal disease: Secondary | ICD-10-CM | POA: Diagnosis not present

## 2020-06-07 DIAGNOSIS — D631 Anemia in chronic kidney disease: Secondary | ICD-10-CM | POA: Diagnosis not present

## 2020-06-07 DIAGNOSIS — M47816 Spondylosis without myelopathy or radiculopathy, lumbar region: Secondary | ICD-10-CM | POA: Diagnosis not present

## 2020-06-07 DIAGNOSIS — N186 End stage renal disease: Secondary | ICD-10-CM | POA: Diagnosis not present

## 2020-06-07 DIAGNOSIS — I2510 Atherosclerotic heart disease of native coronary artery without angina pectoris: Secondary | ICD-10-CM | POA: Diagnosis not present

## 2020-06-07 DIAGNOSIS — F39 Unspecified mood [affective] disorder: Secondary | ICD-10-CM | POA: Diagnosis not present

## 2020-06-07 DIAGNOSIS — J9601 Acute respiratory failure with hypoxia: Secondary | ICD-10-CM | POA: Diagnosis not present

## 2020-06-07 DIAGNOSIS — I288 Other diseases of pulmonary vessels: Secondary | ICD-10-CM | POA: Diagnosis not present

## 2020-06-07 DIAGNOSIS — M4800 Spinal stenosis, site unspecified: Secondary | ICD-10-CM | POA: Diagnosis not present

## 2020-06-08 ENCOUNTER — Ambulatory Visit: Payer: Medicare PPO | Admitting: Pulmonary Disease

## 2020-06-08 ENCOUNTER — Encounter: Payer: Self-pay | Admitting: Pulmonary Disease

## 2020-06-08 ENCOUNTER — Other Ambulatory Visit: Payer: Self-pay

## 2020-06-08 VITALS — BP 142/78 | HR 89 | Temp 97.9°F | Ht 66.0 in | Wt 130.2 lb

## 2020-06-08 DIAGNOSIS — R0489 Hemorrhage from other sites in respiratory passages: Secondary | ICD-10-CM

## 2020-06-08 DIAGNOSIS — I776 Arteritis, unspecified: Secondary | ICD-10-CM

## 2020-06-08 DIAGNOSIS — J849 Interstitial pulmonary disease, unspecified: Secondary | ICD-10-CM | POA: Diagnosis not present

## 2020-06-08 DIAGNOSIS — I7782 Antineutrophilic cytoplasmic antibody (ANCA) vasculitis: Secondary | ICD-10-CM

## 2020-06-08 DIAGNOSIS — N186 End stage renal disease: Secondary | ICD-10-CM | POA: Diagnosis not present

## 2020-06-08 DIAGNOSIS — N2581 Secondary hyperparathyroidism of renal origin: Secondary | ICD-10-CM | POA: Diagnosis not present

## 2020-06-08 DIAGNOSIS — Z992 Dependence on renal dialysis: Secondary | ICD-10-CM | POA: Diagnosis not present

## 2020-06-08 NOTE — Progress Notes (Signed)
Jasmine White, Jasmine White, Jasmine White  Chief Complaint  Patient presents with  . Follow-up    Balance issues    Constitutional:  BP (!) 142/78 (BP Location: Right Arm, Cuff Size: Normal)   Pulse 89   Temp 97.9 F (36.6 C) (Temporal)   Ht 5\' 6"  (1.676 m)   Wt 130 lb 3.2 oz (59.1 kg)   SpO2 99% Comment: Room air  BMI 21.01 kg/m   Past Medical History:  Chronic back pain, HTN, Hypothyroidism, CKD, HLD, COVID 11 August 2019  Past Surgical History:  Her  has a past surgical history that includes Back surgery; bil knee replacements; basal cell skin cancer; Breast biopsy; Lumbar laminectomy/decompression microdiscectomy (08/13/2012); Abdominal hysterectomy; Colonoscopy; IR US Guide Vasc Access Right (03/20/2020); IR Fluoro Guide CV Line Right (03/20/2020); AV fistula placement (Left, 04/02/2020); A/V Fistulagram (N/A, 05/17/2020); Jasmine PERIPHERAL VASCULAR BALLOON ANGIOPLASTY (Left, 05/17/2020).  Brief Summary:  Jasmine White is a 74 y.o. 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      Subjective:   She no longer is needing supplemental oxygen.  Not having cough, wheeze, sputum, or chest pain.  Denies fever.  Her activity is limited now more by her balance rather than her breathing.  She wasn't able to get AV fistula Jasmine still getting HD through catheter.  Saw dermatology Jasmine started on antiviral for infection around her nose.  CXR from 05/15/20 showed clearing of previous infiltrates.  Physical Exam:   Appearance - well kempt, thin  ENMT - no sinus tenderness, improved oral lesions, no LAN, Mallampati 2 airway, no stridor  Respiratory - equal breath sounds bilaterally, no wheezing or rales  CV - s1s2 regular rate Jasmine rhythm, no murmurs  Ext - no clubbing, no edema  Skin - no rashes  Psych - normal mood Jasmine affect   Serology:   ANCA 03/18/20 >> MPO  10.3 (nl 0 - 9)  GBM Ab 03/18/20 >> 137 (nl 0 - 20)  ANA 6/27 >> positive, ds DNA Ab 50 (nl < 5)  GMB Ab 04/03/20 >> 55  White testing:   Rt thoracentesis 04/05/20 >> 900 cc straw colored fluid, glucose 133, protein < 3, LDH 50, WBC 63 (72%M)  Bronchoscopy 04/05/20 >> WBC 125 (68%N)   Chest Imaging:   V/Q scan 03/22/20 >> negative  CT chest 03/24/20 >> mosaic areas of GGO Jasmine consolidation, mod b/l effusions  CT chest 04/05/20 >> large b/l effusions with compressive ATX, extensive ASD  HRCT 04/16/20 >> mod Rt Jasmine small Lt effusions, extensive/dense consolidation  Cardiac Tests:   Echo 04/06/20 >>  EF 55 to 60%, grade 1 DD, large Lt effusion, mod MR, mod/severe AR  Social History:  She  reports that she has never smoked. She has never used smokeless tobacco. She reports that she does not drink alcohol Jasmine does not use drugs.  Family History:  Her family history includes Alcohol abuse in her father; Arthritis in her brother Jasmine brother; Cancer (age of onset: 47) in her brother; Heart disease in her father Jasmine mother; Hypertension in her mother; Stroke in her father.     Assessment/Plan:   Interstitial lung disease with Diffuse Alveolar Hemorrhage in July 2021. - in setting of ANCA, GBM positive rapidly progressive glomerulonephritis - will arrange for White function test Jasmine high resolution CT chest to monitor  ESRD in setting of Rapidly Progressive Glomerulonephritis. - prednisone, rituximab  per nephrology - followed by Dr. Marval Regal with [facility]  Anxiety. - she is being transitioned off amitriptyline - she will call back with name of her new anxiety medication  Time Spent Involved in Patient White on Day of Examination:  25 minutes  Follow up:  Patient Instructions  Will schedule White function test  Will schedule high resolution CT chest for November 2021  Follow up in in November 2021   Medication List:   Allergies as of 06/08/2020       Reactions   Hctz [hydrochlorothiazide] Other (See Comments)   Hx of severe Hyponatremia while taking HCTZ - now contraindicated   Percocet [oxycodone-acetaminophen] Nausea Only   Ultram [tramadol] Nausea Only   Makes her crazy Jasmine nauseated      Medication List       Accurate as of June 08, 2020 11:26 AM. If you have any questions, ask your nurse or doctor.        STOP taking these medications   metoprolol succinate 100 MG 24 hr tablet Commonly known as: TOPROL-XL Stopped by: Chesley Mires, MD     TAKE these medications   albuterol 108 (90 Base) MCG/ACT inhaler Commonly known as: VENTOLIN HFA Inhale 2 puffs into the lungs every 6 (six) hours as needed for wheezing or shortness of breath.   amitriptyline 50 MG tablet Commonly known as: ELAVIL Take 1 tablet (50 mg total) by mouth at bedtime.   calcitRIOL 0.25 MCG capsule Commonly known as: ROCALTROL Take 1 capsule (0.25 mcg total) by mouth every Monday, Wednesday, Jasmine Friday with hemodialysis.   heparin 1000 unit/mL Soln injection Heparin Sodium (Porcine) 1,000 Units/mL Catheter Lock Arterial   MIRCERA IJ Mircera   MULTI-VITAMIN DAILY PO SMARTSIG:1 Tablet(s) By Mouth Daily   multivitamin Tabs tablet Take 1 tablet by mouth at bedtime.   omeprazole 20 MG capsule Commonly known as: PriLOSEC Take 1 capsule (20 mg total) by mouth daily.   predniSONE 20 MG tablet Commonly known as: DELTASONE Take 3 tablets (60 mg total) by mouth daily. What changed: how much to take   sulfamethoxazole-trimethoprim 400-80 MG tablet Commonly known as: BACTRIM Take 1 tablet by mouth every Monday, Wednesday, Jasmine Friday.   Synthroid 88 MCG tablet Generic drug: levothyroxine TAKE 1 TABLET BY MOUTH ONCE DAILY BEFORE BREAKFAST What changed:   how much to take  how to take this  when to take this  additional instructions   VITAMIN D PO Take 1 tablet by mouth daily.       Signature:  Chesley Mires, MD [place] Pager - [phone] 06/08/2020, 11:26 AM

## 2020-06-08 NOTE — Patient Instructions (Signed)
Will schedule pulmonary function test  Will schedule high resolution CT chest for November 2021  Follow up in in November 2021

## 2020-06-11 DIAGNOSIS — N186 End stage renal disease: Secondary | ICD-10-CM | POA: Diagnosis not present

## 2020-06-11 DIAGNOSIS — Z992 Dependence on renal dialysis: Secondary | ICD-10-CM | POA: Diagnosis not present

## 2020-06-11 DIAGNOSIS — N2581 Secondary hyperparathyroidism of renal origin: Secondary | ICD-10-CM | POA: Diagnosis not present

## 2020-06-11 NOTE — Addendum Note (Signed)
Addended by: Lia Foyer R on: 06/11/2020 01:31 PM   Modules accepted: Orders

## 2020-06-13 DIAGNOSIS — Z992 Dependence on renal dialysis: Secondary | ICD-10-CM | POA: Diagnosis not present

## 2020-06-13 DIAGNOSIS — N2581 Secondary hyperparathyroidism of renal origin: Secondary | ICD-10-CM | POA: Diagnosis not present

## 2020-06-13 DIAGNOSIS — N186 End stage renal disease: Secondary | ICD-10-CM | POA: Diagnosis not present

## 2020-06-15 DIAGNOSIS — Z992 Dependence on renal dialysis: Secondary | ICD-10-CM | POA: Diagnosis not present

## 2020-06-15 DIAGNOSIS — N2581 Secondary hyperparathyroidism of renal origin: Secondary | ICD-10-CM | POA: Diagnosis not present

## 2020-06-15 DIAGNOSIS — N186 End stage renal disease: Secondary | ICD-10-CM | POA: Diagnosis not present

## 2020-06-18 DIAGNOSIS — N186 End stage renal disease: Secondary | ICD-10-CM | POA: Diagnosis not present

## 2020-06-18 DIAGNOSIS — Z992 Dependence on renal dialysis: Secondary | ICD-10-CM | POA: Diagnosis not present

## 2020-06-18 DIAGNOSIS — N2581 Secondary hyperparathyroidism of renal origin: Secondary | ICD-10-CM | POA: Diagnosis not present

## 2020-06-20 DIAGNOSIS — N2581 Secondary hyperparathyroidism of renal origin: Secondary | ICD-10-CM | POA: Diagnosis not present

## 2020-06-20 DIAGNOSIS — N186 End stage renal disease: Secondary | ICD-10-CM | POA: Diagnosis not present

## 2020-06-20 DIAGNOSIS — Z992 Dependence on renal dialysis: Secondary | ICD-10-CM | POA: Diagnosis not present

## 2020-06-21 DIAGNOSIS — Z992 Dependence on renal dialysis: Secondary | ICD-10-CM | POA: Diagnosis not present

## 2020-06-21 DIAGNOSIS — N179 Acute kidney failure, unspecified: Secondary | ICD-10-CM | POA: Diagnosis not present

## 2020-06-21 DIAGNOSIS — N186 End stage renal disease: Secondary | ICD-10-CM | POA: Diagnosis not present

## 2020-06-22 DIAGNOSIS — Z992 Dependence on renal dialysis: Secondary | ICD-10-CM | POA: Diagnosis not present

## 2020-06-22 DIAGNOSIS — N186 End stage renal disease: Secondary | ICD-10-CM | POA: Diagnosis not present

## 2020-06-22 DIAGNOSIS — N2581 Secondary hyperparathyroidism of renal origin: Secondary | ICD-10-CM | POA: Diagnosis not present

## 2020-06-25 DIAGNOSIS — N2581 Secondary hyperparathyroidism of renal origin: Secondary | ICD-10-CM | POA: Diagnosis not present

## 2020-06-25 DIAGNOSIS — Z992 Dependence on renal dialysis: Secondary | ICD-10-CM | POA: Diagnosis not present

## 2020-06-25 DIAGNOSIS — N186 End stage renal disease: Secondary | ICD-10-CM | POA: Diagnosis not present

## 2020-06-27 DIAGNOSIS — Z992 Dependence on renal dialysis: Secondary | ICD-10-CM | POA: Diagnosis not present

## 2020-06-27 DIAGNOSIS — N186 End stage renal disease: Secondary | ICD-10-CM | POA: Diagnosis not present

## 2020-06-27 DIAGNOSIS — N2581 Secondary hyperparathyroidism of renal origin: Secondary | ICD-10-CM | POA: Diagnosis not present

## 2020-06-29 DIAGNOSIS — N2581 Secondary hyperparathyroidism of renal origin: Secondary | ICD-10-CM | POA: Diagnosis not present

## 2020-06-29 DIAGNOSIS — Z992 Dependence on renal dialysis: Secondary | ICD-10-CM | POA: Diagnosis not present

## 2020-06-29 DIAGNOSIS — N186 End stage renal disease: Secondary | ICD-10-CM | POA: Diagnosis not present

## 2020-07-02 DIAGNOSIS — N186 End stage renal disease: Secondary | ICD-10-CM | POA: Diagnosis not present

## 2020-07-02 DIAGNOSIS — N2581 Secondary hyperparathyroidism of renal origin: Secondary | ICD-10-CM | POA: Diagnosis not present

## 2020-07-02 DIAGNOSIS — Z992 Dependence on renal dialysis: Secondary | ICD-10-CM | POA: Diagnosis not present

## 2020-07-03 DIAGNOSIS — I776 Arteritis, unspecified: Secondary | ICD-10-CM | POA: Diagnosis not present

## 2020-07-03 DIAGNOSIS — N186 End stage renal disease: Secondary | ICD-10-CM | POA: Diagnosis not present

## 2020-07-03 DIAGNOSIS — G8929 Other chronic pain: Secondary | ICD-10-CM | POA: Diagnosis not present

## 2020-07-03 DIAGNOSIS — M545 Low back pain, unspecified: Secondary | ICD-10-CM | POA: Diagnosis not present

## 2020-07-03 DIAGNOSIS — E039 Hypothyroidism, unspecified: Secondary | ICD-10-CM | POA: Diagnosis not present

## 2020-07-03 DIAGNOSIS — F3341 Major depressive disorder, recurrent, in partial remission: Secondary | ICD-10-CM | POA: Diagnosis not present

## 2020-07-04 DIAGNOSIS — Z992 Dependence on renal dialysis: Secondary | ICD-10-CM | POA: Diagnosis not present

## 2020-07-04 DIAGNOSIS — N2581 Secondary hyperparathyroidism of renal origin: Secondary | ICD-10-CM | POA: Diagnosis not present

## 2020-07-04 DIAGNOSIS — N186 End stage renal disease: Secondary | ICD-10-CM | POA: Diagnosis not present

## 2020-07-06 DIAGNOSIS — Z992 Dependence on renal dialysis: Secondary | ICD-10-CM | POA: Diagnosis not present

## 2020-07-06 DIAGNOSIS — N2581 Secondary hyperparathyroidism of renal origin: Secondary | ICD-10-CM | POA: Diagnosis not present

## 2020-07-06 DIAGNOSIS — N186 End stage renal disease: Secondary | ICD-10-CM | POA: Diagnosis not present

## 2020-07-09 DIAGNOSIS — N186 End stage renal disease: Secondary | ICD-10-CM | POA: Diagnosis not present

## 2020-07-09 DIAGNOSIS — N2581 Secondary hyperparathyroidism of renal origin: Secondary | ICD-10-CM | POA: Diagnosis not present

## 2020-07-09 DIAGNOSIS — Z992 Dependence on renal dialysis: Secondary | ICD-10-CM | POA: Diagnosis not present

## 2020-07-11 DIAGNOSIS — N186 End stage renal disease: Secondary | ICD-10-CM | POA: Diagnosis not present

## 2020-07-11 DIAGNOSIS — N2581 Secondary hyperparathyroidism of renal origin: Secondary | ICD-10-CM | POA: Diagnosis not present

## 2020-07-11 DIAGNOSIS — Z992 Dependence on renal dialysis: Secondary | ICD-10-CM | POA: Diagnosis not present

## 2020-07-13 DIAGNOSIS — Z992 Dependence on renal dialysis: Secondary | ICD-10-CM | POA: Diagnosis not present

## 2020-07-13 DIAGNOSIS — N2581 Secondary hyperparathyroidism of renal origin: Secondary | ICD-10-CM | POA: Diagnosis not present

## 2020-07-13 DIAGNOSIS — N186 End stage renal disease: Secondary | ICD-10-CM | POA: Diagnosis not present

## 2020-07-16 DIAGNOSIS — Z992 Dependence on renal dialysis: Secondary | ICD-10-CM | POA: Diagnosis not present

## 2020-07-16 DIAGNOSIS — N2581 Secondary hyperparathyroidism of renal origin: Secondary | ICD-10-CM | POA: Diagnosis not present

## 2020-07-16 DIAGNOSIS — N186 End stage renal disease: Secondary | ICD-10-CM | POA: Diagnosis not present

## 2020-07-18 DIAGNOSIS — Z992 Dependence on renal dialysis: Secondary | ICD-10-CM | POA: Diagnosis not present

## 2020-07-18 DIAGNOSIS — N186 End stage renal disease: Secondary | ICD-10-CM | POA: Diagnosis not present

## 2020-07-18 DIAGNOSIS — N2581 Secondary hyperparathyroidism of renal origin: Secondary | ICD-10-CM | POA: Diagnosis not present

## 2020-07-19 DIAGNOSIS — H43393 Other vitreous opacities, bilateral: Secondary | ICD-10-CM | POA: Diagnosis not present

## 2020-07-19 DIAGNOSIS — H40033 Anatomical narrow angle, bilateral: Secondary | ICD-10-CM | POA: Diagnosis not present

## 2020-07-20 ENCOUNTER — Other Ambulatory Visit: Payer: Self-pay

## 2020-07-20 ENCOUNTER — Other Ambulatory Visit (HOSPITAL_COMMUNITY)
Admission: RE | Admit: 2020-07-20 | Discharge: 2020-07-20 | Disposition: A | Discharge status: Home / Self Care | Payer: Medicare PPO | Source: Ambulatory Visit | Attending: Pulmonary Disease | Admitting: Pulmonary Disease

## 2020-07-20 DIAGNOSIS — Z01812 Encounter for preprocedural laboratory examination: Secondary | ICD-10-CM | POA: Insufficient documentation

## 2020-07-20 DIAGNOSIS — Z992 Dependence on renal dialysis: Secondary | ICD-10-CM | POA: Diagnosis not present

## 2020-07-20 DIAGNOSIS — Z20822 Contact with and (suspected) exposure to covid-19: Secondary | ICD-10-CM | POA: Diagnosis not present

## 2020-07-20 DIAGNOSIS — N186 End stage renal disease: Secondary | ICD-10-CM | POA: Diagnosis not present

## 2020-07-20 DIAGNOSIS — N2581 Secondary hyperparathyroidism of renal origin: Secondary | ICD-10-CM | POA: Diagnosis not present

## 2020-07-20 LAB — SARS CORONAVIRUS 2 (TAT 6-24 HRS): SARS Coronavirus 2: NEGATIVE

## 2020-07-22 DIAGNOSIS — N179 Acute kidney failure, unspecified: Secondary | ICD-10-CM | POA: Diagnosis not present

## 2020-07-22 DIAGNOSIS — Z992 Dependence on renal dialysis: Secondary | ICD-10-CM | POA: Diagnosis not present

## 2020-07-22 DIAGNOSIS — N186 End stage renal disease: Secondary | ICD-10-CM | POA: Diagnosis not present

## 2020-07-23 DIAGNOSIS — Z992 Dependence on renal dialysis: Secondary | ICD-10-CM | POA: Diagnosis not present

## 2020-07-23 DIAGNOSIS — N2581 Secondary hyperparathyroidism of renal origin: Secondary | ICD-10-CM | POA: Diagnosis not present

## 2020-07-23 DIAGNOSIS — N186 End stage renal disease: Secondary | ICD-10-CM | POA: Diagnosis not present

## 2020-07-24 ENCOUNTER — Other Ambulatory Visit: Payer: Self-pay

## 2020-07-24 ENCOUNTER — Ambulatory Visit (HOSPITAL_COMMUNITY)
Admission: RE | Admit: 2020-07-24 | Discharge: 2020-07-24 | Disposition: A | Discharge status: Home / Self Care | Payer: Medicare PPO | Source: Ambulatory Visit | Attending: Pulmonary Disease | Admitting: Pulmonary Disease

## 2020-07-24 DIAGNOSIS — J849 Interstitial pulmonary disease, unspecified: Secondary | ICD-10-CM | POA: Diagnosis not present

## 2020-07-24 LAB — PULMONARY FUNCTION TEST
DL/VA % pred: 85 %
DL/VA: 3.47 ml/min/mmHg/L
DLCO unc % pred: 59 %
DLCO unc: 12.07 ml/min/mmHg
FEF 25-75 Post: 2.12 L/sec
FEF 25-75 Pre: 1.68 L/sec
FEF2575-%Change-Post: 26 %
FEF2575-%Pred-Post: 119 %
FEF2575-%Pred-Pre: 94 %
FEV1-%Change-Post: 0 %
FEV1-%Pred-Post: 73 %
FEV1-%Pred-Pre: 74 %
FEV1-Post: 1.7 L
FEV1-Pre: 1.71 L
FEV1FVC-%Change-Post: 6 %
FEV1FVC-%Pred-Pre: 111 %
FEV6-%Change-Post: -5 %
FEV6-%Pred-Post: 65 %
FEV6-%Pred-Pre: 69 %
FEV6-Post: 1.91 L
FEV6-Pre: 2.03 L
FEV6FVC-%Change-Post: 0 %
FEV6FVC-%Pred-Post: 104 %
FEV6FVC-%Pred-Pre: 104 %
FVC-%Change-Post: -6 %
FVC-%Pred-Post: 62 %
FVC-%Pred-Pre: 66 %
FVC-Post: 1.91 L
FVC-Pre: 2.04 L
Post FEV1/FVC ratio: 89 %
Post FEV6/FVC ratio: 100 %
Pre FEV1/FVC ratio: 84 %
Pre FEV6/FVC Ratio: 99 %
RV % pred: 85 %
RV: 2.04 L
TLC % pred: 73 %
TLC: 3.92 L

## 2020-07-24 MED ORDER — ALBUTEROL SULFATE (2.5 MG/3ML) 0.083% IN NEBU
2.5000 mg | INHALATION_SOLUTION | Freq: Once | RESPIRATORY_TRACT | Status: AC
  Administered 2020-07-24: 2.5 mg via RESPIRATORY_TRACT

## 2020-07-25 ENCOUNTER — Telehealth: Payer: Self-pay | Admitting: Pulmonary Disease

## 2020-07-25 DIAGNOSIS — N2581 Secondary hyperparathyroidism of renal origin: Secondary | ICD-10-CM | POA: Diagnosis not present

## 2020-07-25 DIAGNOSIS — Z992 Dependence on renal dialysis: Secondary | ICD-10-CM | POA: Diagnosis not present

## 2020-07-25 DIAGNOSIS — N186 End stage renal disease: Secondary | ICD-10-CM | POA: Diagnosis not present

## 2020-07-25 NOTE — Telephone Encounter (Signed)
Please schedule ROV to review PFT result.

## 2020-07-25 NOTE — Telephone Encounter (Signed)
Called and phone number that was attached to patient chart was patient's daughter's phone number. Patient's daughter gave me patient's home number and it was changed in the chart and she stated that patient is currently at dialysis. I will attempt to call patient tomorrow.

## 2020-07-26 NOTE — Telephone Encounter (Signed)
Called and scheduled office visit with Dr Halford Chessman at the [place] office for Wednesday 08/01/2020 at 10am. Patient agreeable to time, date and location. Nothing further needed at this time.

## 2020-07-27 DIAGNOSIS — N2581 Secondary hyperparathyroidism of renal origin: Secondary | ICD-10-CM | POA: Diagnosis not present

## 2020-07-27 DIAGNOSIS — N186 End stage renal disease: Secondary | ICD-10-CM | POA: Diagnosis not present

## 2020-07-27 DIAGNOSIS — Z992 Dependence on renal dialysis: Secondary | ICD-10-CM | POA: Diagnosis not present

## 2020-07-30 DIAGNOSIS — Z992 Dependence on renal dialysis: Secondary | ICD-10-CM | POA: Diagnosis not present

## 2020-07-30 DIAGNOSIS — N186 End stage renal disease: Secondary | ICD-10-CM | POA: Diagnosis not present

## 2020-07-30 DIAGNOSIS — N2581 Secondary hyperparathyroidism of renal origin: Secondary | ICD-10-CM | POA: Diagnosis not present

## 2020-08-01 ENCOUNTER — Ambulatory Visit: Payer: Medicare PPO | Admitting: Pulmonary Disease

## 2020-08-01 ENCOUNTER — Other Ambulatory Visit: Payer: Self-pay

## 2020-08-01 ENCOUNTER — Encounter: Payer: Self-pay | Admitting: Pulmonary Disease

## 2020-08-01 VITALS — BP 124/62 | HR 78 | Temp 97.5°F | Ht 66.0 in | Wt 128.0 lb

## 2020-08-01 DIAGNOSIS — R0489 Hemorrhage from other sites in respiratory passages: Secondary | ICD-10-CM

## 2020-08-01 DIAGNOSIS — Z992 Dependence on renal dialysis: Secondary | ICD-10-CM | POA: Diagnosis not present

## 2020-08-01 DIAGNOSIS — N186 End stage renal disease: Secondary | ICD-10-CM | POA: Diagnosis not present

## 2020-08-01 DIAGNOSIS — J849 Interstitial pulmonary disease, unspecified: Secondary | ICD-10-CM | POA: Diagnosis not present

## 2020-08-01 DIAGNOSIS — I7782 Antineutrophilic cytoplasmic antibody (ANCA) vasculitis: Secondary | ICD-10-CM

## 2020-08-01 DIAGNOSIS — I776 Arteritis, unspecified: Secondary | ICD-10-CM

## 2020-08-01 DIAGNOSIS — N2581 Secondary hyperparathyroidism of renal origin: Secondary | ICD-10-CM | POA: Diagnosis not present

## 2020-08-01 NOTE — Progress Notes (Signed)
[facility]  Chief Complaint  Patient presents with   Follow-up    No complaints currently    Constitutional:  BP 124/62 (BP Location: Right Arm, Cuff Size: Normal)    Pulse 78    Temp (!) 97.5 F (36.4 C) (Other (Comment)) Comment (Src): wrist   Ht 5\' 6"  (1.676 m)    Wt 128 lb (58.1 kg)    SpO2 98% Comment: Room air   BMI 20.66 kg/m   Past Medical History:  Chronic back pain, HTN, Hypothyroidism, CKD, HLD, COVID 11 August 2019  Past Surgical History:  Her  has a past surgical history that includes Back surgery; bil knee replacements; basal cell skin cancer; Breast biopsy; Lumbar laminectomy/decompression microdiscectomy (08/13/2012); Abdominal hysterectomy; Colonoscopy; IR US Guide Vasc Access Right (03/20/2020); IR Fluoro Guide CV Line Right (03/20/2020); AV fistula placement (Left, 04/02/2020); A/V Fistulagram (N/A, 05/17/2020); and PERIPHERAL VASCULAR BALLOON ANGIOPLASTY (Left, 05/17/2020).  Brief Summary:  Jasmine White is a 74 y.o. 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      Subjective:   Breathing has been okay.  Not having cough, fever, wheeze, chest pain, hemoptysis, joint swelling, or leg swelling.  Down to 20 mg prednisone daily.  PFT report read as moderate to severe restriction, but in my review restriction in mild.  She has CT chest coming up later this month.  Physical Exam:   Appearance - well kempt   ENMT - no sinus tenderness, no oral exudate, no LAN, Mallampati 4 airway, no stridor  Respiratory - equal breath sounds bilaterally, no wheezing or rales  CV - s1s2 regular rate and rhythm, no murmurs  Ext - no clubbing, no edema, AV graft Lt arm  Skin - multiple areas of ecchymosis  Psych - normal mood and affect    Serology:   ANCA 03/18/20 >> MPO 10.3 (nl 0 - 9)  GBM Ab 03/18/20 >> 137 (nl 0 - 20)  ANA  6/27 >> positive, ds DNA Ab 50 (nl < 5)  GMB Ab 04/03/20 >> 55  Pulmonary testing:   Rt thoracentesis 04/05/20 >> 900 cc straw colored fluid, glucose 133, protein < 3, LDH 50, WBC 63 (72%M)  Bronchoscopy 04/05/20 >> WBC 125 (68%N)  PFT 07/24/20 >> FEV1 1.71 (74%), FEV1% 84, TLC 3.92 (73%), DLCO 59%  Chest Imaging:   V/Q scan 03/22/20 >> negative  CT chest 03/24/20 >> mosaic areas of GGO and consolidation, mod b/l effusions  CT chest 04/05/20 >> large b/l effusions with compressive ATX, extensive ASD  HRCT 04/16/20 >> mod Rt and small Lt effusions, extensive/dense consolidation  Cardiac Tests:   Echo 04/06/20 >>  EF 55 to 60%, grade 1 DD, large Lt effusion, mod MR, mod/severe AR  Social History:  She  reports that she has never smoked. She has never used smokeless tobacco. She reports that she does not drink alcohol and does not use drugs.  Family History:  Her family history includes Alcohol abuse in her father; Arthritis in her brother and brother; Cancer (age of onset: 43) in her brother; Heart disease in her father and mother; Hypertension in her mother; Stroke in her father.     Assessment/Plan:   Interstitial lung disease with Diffuse Alveolar Hemorrhage in July 2021. - in setting of ANCA, GBM positive rapidly progressive glomerulonephritis - clinically improved - PFT showed mild restriction and moderate diffusion defect - will  call her with results of follow up HRCT chest later this month  ESRD in setting of Rapidly Progressive Glomerulonephritis. - followed by Dr. Marval Regal with [facility] - completed course of rituximab - down to 20 mg prednisone daily   Time Spent Involved in Patient Care on Day of Examination:  24 minutes  Follow up:  Patient Instructions  Will call with results of CT chest  Follow up in 4 months   Medication List:   Allergies as of 08/01/2020      Reactions   Hctz [hydrochlorothiazide] Other (See Comments)   Hx of severe  Hyponatremia while taking HCTZ - now contraindicated   Percocet [oxycodone-acetaminophen] Nausea Only   Ultram [tramadol] Nausea Only   Makes her crazy and nauseated      Medication List       Accurate as of August 01, 2020 10:28 AM. If you have any questions, ask your nurse or doctor.        STOP taking these medications   amitriptyline 50 MG tablet Commonly known as: ELAVIL Stopped by: Chesley Mires, MD   omeprazole 20 MG capsule Commonly known as: PriLOSEC Stopped by: Chesley Mires, MD     TAKE these medications   albuterol 108 (90 Base) MCG/ACT inhaler Commonly known as: VENTOLIN HFA Inhale 2 puffs into the lungs every 6 (six) hours as needed for wheezing or shortness of breath.   b complex-vitamin c-folic acid 0.8 MG Tabs tablet Take 1 tablet by mouth at bedtime.   calcitRIOL 0.25 MCG capsule Commonly known as: ROCALTROL Take 1 capsule (0.25 mcg total) by mouth every Monday, Wednesday, and Friday with hemodialysis.   escitalopram 10 MG tablet Commonly known as: LEXAPRO Take 10 mg by mouth daily.   heparin 1000 unit/mL Soln injection Heparin Sodium (Porcine) 1,000 Units/mL Catheter Lock Arterial   HYDROcodone-acetaminophen 5-325 MG tablet Commonly known as: NORCO/VICODIN Take 0.5 tablets by mouth every 6 (six) hours as needed for moderate pain.   MIRCERA IJ Mircera   MULTI-VITAMIN DAILY PO SMARTSIG:1 Tablet(s) By Mouth Daily   predniSONE 20 MG tablet Commonly known as: DELTASONE Take 3 tablets (60 mg total) by mouth daily. What changed: how much to take   sevelamer carbonate 800 MG tablet Commonly known as: RENVELA Take 800 mg by mouth 3 (three) times daily with meals.   sulfamethoxazole-trimethoprim 400-80 MG tablet Commonly known as: BACTRIM Take 1 tablet by mouth every Monday, Wednesday, and Friday.   Synthroid 88 MCG tablet Generic drug: levothyroxine TAKE 1 TABLET BY MOUTH ONCE DAILY BEFORE BREAKFAST What changed:   how much to take  how  to take this  when to take this  additional instructions   VITAMIN D PO Take 1 tablet by mouth daily.       Signature:  Chesley Mires, MD [place] Pager - [phone] 08/01/2020, 10:28 AM

## 2020-08-01 NOTE — Patient Instructions (Signed)
Will call with results of CT chest  Follow up in 4 months

## 2020-08-03 DIAGNOSIS — N186 End stage renal disease: Secondary | ICD-10-CM | POA: Diagnosis not present

## 2020-08-03 DIAGNOSIS — Z992 Dependence on renal dialysis: Secondary | ICD-10-CM | POA: Diagnosis not present

## 2020-08-03 DIAGNOSIS — N2581 Secondary hyperparathyroidism of renal origin: Secondary | ICD-10-CM | POA: Diagnosis not present

## 2020-08-06 ENCOUNTER — Ambulatory Visit (HOSPITAL_COMMUNITY): Payer: Medicare PPO

## 2020-08-06 DIAGNOSIS — N186 End stage renal disease: Secondary | ICD-10-CM | POA: Diagnosis not present

## 2020-08-06 DIAGNOSIS — Z992 Dependence on renal dialysis: Secondary | ICD-10-CM | POA: Diagnosis not present

## 2020-08-06 DIAGNOSIS — N2581 Secondary hyperparathyroidism of renal origin: Secondary | ICD-10-CM | POA: Diagnosis not present

## 2020-08-07 ENCOUNTER — Other Ambulatory Visit: Payer: Self-pay

## 2020-08-07 ENCOUNTER — Ambulatory Visit (HOSPITAL_COMMUNITY)
Admission: RE | Admit: 2020-08-07 | Discharge: 2020-08-07 | Disposition: A | Discharge status: Home / Self Care | Payer: Medicare PPO | Source: Ambulatory Visit | Attending: Pulmonary Disease | Admitting: Pulmonary Disease

## 2020-08-07 DIAGNOSIS — J849 Interstitial pulmonary disease, unspecified: Secondary | ICD-10-CM | POA: Insufficient documentation

## 2020-08-07 DIAGNOSIS — I700 Atherosclerosis of aorta: Secondary | ICD-10-CM | POA: Diagnosis not present

## 2020-08-07 DIAGNOSIS — I2510 Atherosclerotic heart disease of native coronary artery without angina pectoris: Secondary | ICD-10-CM | POA: Diagnosis not present

## 2020-08-08 ENCOUNTER — Telehealth: Payer: Self-pay | Admitting: Pulmonary Disease

## 2020-08-08 DIAGNOSIS — N2581 Secondary hyperparathyroidism of renal origin: Secondary | ICD-10-CM | POA: Diagnosis not present

## 2020-08-08 DIAGNOSIS — N186 End stage renal disease: Secondary | ICD-10-CM | POA: Diagnosis not present

## 2020-08-08 DIAGNOSIS — Z992 Dependence on renal dialysis: Secondary | ICD-10-CM | POA: Diagnosis not present

## 2020-08-08 NOTE — Telephone Encounter (Signed)
HRCT chest 08/07/20 >> resolution of previous ASD and b/l effusions; mild irregular peripheral interstitial opacity throughout the lungs with a slight apical to basal gradient    Trying calling patient, but no answer.  Please let her know that CT chest looks much better compared to last Summer.

## 2020-08-09 NOTE — Telephone Encounter (Signed)
Called and went over CT results per Dr Halford Chessman with patient. All questions answered and patient expressed full understanding. Nothing further needed at this time.

## 2020-08-10 DIAGNOSIS — N186 End stage renal disease: Secondary | ICD-10-CM | POA: Diagnosis not present

## 2020-08-10 DIAGNOSIS — Z992 Dependence on renal dialysis: Secondary | ICD-10-CM | POA: Diagnosis not present

## 2020-08-10 DIAGNOSIS — N2581 Secondary hyperparathyroidism of renal origin: Secondary | ICD-10-CM | POA: Diagnosis not present

## 2020-08-13 DIAGNOSIS — N186 End stage renal disease: Secondary | ICD-10-CM | POA: Diagnosis not present

## 2020-08-13 DIAGNOSIS — Z992 Dependence on renal dialysis: Secondary | ICD-10-CM | POA: Diagnosis not present

## 2020-08-13 DIAGNOSIS — N2581 Secondary hyperparathyroidism of renal origin: Secondary | ICD-10-CM | POA: Diagnosis not present

## 2020-08-15 DIAGNOSIS — N186 End stage renal disease: Secondary | ICD-10-CM | POA: Diagnosis not present

## 2020-08-15 DIAGNOSIS — Z992 Dependence on renal dialysis: Secondary | ICD-10-CM | POA: Diagnosis not present

## 2020-08-15 DIAGNOSIS — N2581 Secondary hyperparathyroidism of renal origin: Secondary | ICD-10-CM | POA: Diagnosis not present

## 2020-08-17 DIAGNOSIS — Z992 Dependence on renal dialysis: Secondary | ICD-10-CM | POA: Diagnosis not present

## 2020-08-17 DIAGNOSIS — N2581 Secondary hyperparathyroidism of renal origin: Secondary | ICD-10-CM | POA: Diagnosis not present

## 2020-08-17 DIAGNOSIS — N186 End stage renal disease: Secondary | ICD-10-CM | POA: Diagnosis not present

## 2020-08-20 DIAGNOSIS — N186 End stage renal disease: Secondary | ICD-10-CM | POA: Diagnosis not present

## 2020-08-20 DIAGNOSIS — Z992 Dependence on renal dialysis: Secondary | ICD-10-CM | POA: Diagnosis not present

## 2020-08-20 DIAGNOSIS — N2581 Secondary hyperparathyroidism of renal origin: Secondary | ICD-10-CM | POA: Diagnosis not present

## 2020-08-21 DIAGNOSIS — N186 End stage renal disease: Secondary | ICD-10-CM | POA: Diagnosis not present

## 2020-08-21 DIAGNOSIS — N179 Acute kidney failure, unspecified: Secondary | ICD-10-CM | POA: Diagnosis not present

## 2020-08-21 DIAGNOSIS — Z992 Dependence on renal dialysis: Secondary | ICD-10-CM | POA: Diagnosis not present

## 2020-08-21 DIAGNOSIS — I871 Compression of vein: Secondary | ICD-10-CM | POA: Diagnosis not present

## 2020-08-21 DIAGNOSIS — T82858A Stenosis of vascular prosthetic devices, implants and grafts, initial encounter: Secondary | ICD-10-CM | POA: Diagnosis not present

## 2020-08-22 DIAGNOSIS — N186 End stage renal disease: Secondary | ICD-10-CM | POA: Diagnosis not present

## 2020-08-22 DIAGNOSIS — Z992 Dependence on renal dialysis: Secondary | ICD-10-CM | POA: Diagnosis not present

## 2020-08-22 DIAGNOSIS — N2581 Secondary hyperparathyroidism of renal origin: Secondary | ICD-10-CM | POA: Diagnosis not present

## 2020-08-24 DIAGNOSIS — Z992 Dependence on renal dialysis: Secondary | ICD-10-CM | POA: Diagnosis not present

## 2020-08-24 DIAGNOSIS — N2581 Secondary hyperparathyroidism of renal origin: Secondary | ICD-10-CM | POA: Diagnosis not present

## 2020-08-24 DIAGNOSIS — N186 End stage renal disease: Secondary | ICD-10-CM | POA: Diagnosis not present

## 2020-08-27 DIAGNOSIS — N186 End stage renal disease: Secondary | ICD-10-CM | POA: Diagnosis not present

## 2020-08-27 DIAGNOSIS — N2581 Secondary hyperparathyroidism of renal origin: Secondary | ICD-10-CM | POA: Diagnosis not present

## 2020-08-27 DIAGNOSIS — Z992 Dependence on renal dialysis: Secondary | ICD-10-CM | POA: Diagnosis not present

## 2020-08-29 DIAGNOSIS — N2581 Secondary hyperparathyroidism of renal origin: Secondary | ICD-10-CM | POA: Diagnosis not present

## 2020-08-29 DIAGNOSIS — Z992 Dependence on renal dialysis: Secondary | ICD-10-CM | POA: Diagnosis not present

## 2020-08-29 DIAGNOSIS — N186 End stage renal disease: Secondary | ICD-10-CM | POA: Diagnosis not present

## 2020-08-31 DIAGNOSIS — Z992 Dependence on renal dialysis: Secondary | ICD-10-CM | POA: Diagnosis not present

## 2020-08-31 DIAGNOSIS — N186 End stage renal disease: Secondary | ICD-10-CM | POA: Diagnosis not present

## 2020-08-31 DIAGNOSIS — N2581 Secondary hyperparathyroidism of renal origin: Secondary | ICD-10-CM | POA: Diagnosis not present

## 2020-09-03 DIAGNOSIS — N186 End stage renal disease: Secondary | ICD-10-CM | POA: Diagnosis not present

## 2020-09-03 DIAGNOSIS — Z992 Dependence on renal dialysis: Secondary | ICD-10-CM | POA: Diagnosis not present

## 2020-09-03 DIAGNOSIS — N2581 Secondary hyperparathyroidism of renal origin: Secondary | ICD-10-CM | POA: Diagnosis not present

## 2020-09-05 DIAGNOSIS — N2581 Secondary hyperparathyroidism of renal origin: Secondary | ICD-10-CM | POA: Diagnosis not present

## 2020-09-05 DIAGNOSIS — Z992 Dependence on renal dialysis: Secondary | ICD-10-CM | POA: Diagnosis not present

## 2020-09-05 DIAGNOSIS — N186 End stage renal disease: Secondary | ICD-10-CM | POA: Diagnosis not present

## 2020-09-06 ENCOUNTER — Ambulatory Visit: Payer: Self-pay | Admitting: Nurse Practitioner

## 2020-09-07 DIAGNOSIS — N2581 Secondary hyperparathyroidism of renal origin: Secondary | ICD-10-CM | POA: Diagnosis not present

## 2020-09-07 DIAGNOSIS — Z992 Dependence on renal dialysis: Secondary | ICD-10-CM | POA: Diagnosis not present

## 2020-09-07 DIAGNOSIS — N186 End stage renal disease: Secondary | ICD-10-CM | POA: Diagnosis not present

## 2020-09-10 DIAGNOSIS — N2581 Secondary hyperparathyroidism of renal origin: Secondary | ICD-10-CM | POA: Diagnosis not present

## 2020-09-10 DIAGNOSIS — Z992 Dependence on renal dialysis: Secondary | ICD-10-CM | POA: Diagnosis not present

## 2020-09-10 DIAGNOSIS — N186 End stage renal disease: Secondary | ICD-10-CM | POA: Diagnosis not present

## 2020-09-11 DIAGNOSIS — Z452 Encounter for adjustment and management of vascular access device: Secondary | ICD-10-CM | POA: Diagnosis not present

## 2020-09-12 DIAGNOSIS — Z992 Dependence on renal dialysis: Secondary | ICD-10-CM | POA: Diagnosis not present

## 2020-09-12 DIAGNOSIS — N2581 Secondary hyperparathyroidism of renal origin: Secondary | ICD-10-CM | POA: Diagnosis not present

## 2020-09-12 DIAGNOSIS — N186 End stage renal disease: Secondary | ICD-10-CM | POA: Diagnosis not present

## 2020-09-14 DIAGNOSIS — N2581 Secondary hyperparathyroidism of renal origin: Secondary | ICD-10-CM | POA: Diagnosis not present

## 2020-09-14 DIAGNOSIS — Z992 Dependence on renal dialysis: Secondary | ICD-10-CM | POA: Diagnosis not present

## 2020-09-14 DIAGNOSIS — N186 End stage renal disease: Secondary | ICD-10-CM | POA: Diagnosis not present

## 2020-09-17 DIAGNOSIS — N2581 Secondary hyperparathyroidism of renal origin: Secondary | ICD-10-CM | POA: Diagnosis not present

## 2020-09-17 DIAGNOSIS — N186 End stage renal disease: Secondary | ICD-10-CM | POA: Diagnosis not present

## 2020-09-17 DIAGNOSIS — Z992 Dependence on renal dialysis: Secondary | ICD-10-CM | POA: Diagnosis not present

## 2020-09-19 DIAGNOSIS — N2581 Secondary hyperparathyroidism of renal origin: Secondary | ICD-10-CM | POA: Diagnosis not present

## 2020-09-19 DIAGNOSIS — N186 End stage renal disease: Secondary | ICD-10-CM | POA: Diagnosis not present

## 2020-09-19 DIAGNOSIS — Z992 Dependence on renal dialysis: Secondary | ICD-10-CM | POA: Diagnosis not present

## 2020-09-21 DIAGNOSIS — N179 Acute kidney failure, unspecified: Secondary | ICD-10-CM | POA: Diagnosis not present

## 2020-09-21 DIAGNOSIS — N2581 Secondary hyperparathyroidism of renal origin: Secondary | ICD-10-CM | POA: Diagnosis not present

## 2020-09-21 DIAGNOSIS — N186 End stage renal disease: Secondary | ICD-10-CM | POA: Diagnosis not present

## 2020-09-21 DIAGNOSIS — Z992 Dependence on renal dialysis: Secondary | ICD-10-CM | POA: Diagnosis not present

## 2020-09-24 DIAGNOSIS — N186 End stage renal disease: Secondary | ICD-10-CM | POA: Diagnosis not present

## 2020-09-24 DIAGNOSIS — N2581 Secondary hyperparathyroidism of renal origin: Secondary | ICD-10-CM | POA: Diagnosis not present

## 2020-09-24 DIAGNOSIS — Z992 Dependence on renal dialysis: Secondary | ICD-10-CM | POA: Diagnosis not present

## 2020-09-26 DIAGNOSIS — N186 End stage renal disease: Secondary | ICD-10-CM | POA: Diagnosis not present

## 2020-09-26 DIAGNOSIS — Z992 Dependence on renal dialysis: Secondary | ICD-10-CM | POA: Diagnosis not present

## 2020-09-26 DIAGNOSIS — N2581 Secondary hyperparathyroidism of renal origin: Secondary | ICD-10-CM | POA: Diagnosis not present

## 2020-09-28 DIAGNOSIS — N2581 Secondary hyperparathyroidism of renal origin: Secondary | ICD-10-CM | POA: Diagnosis not present

## 2020-09-28 DIAGNOSIS — Z992 Dependence on renal dialysis: Secondary | ICD-10-CM | POA: Diagnosis not present

## 2020-09-28 DIAGNOSIS — N186 End stage renal disease: Secondary | ICD-10-CM | POA: Diagnosis not present

## 2020-10-01 ENCOUNTER — Other Ambulatory Visit: Payer: Self-pay | Admitting: Internal Medicine

## 2020-10-01 DIAGNOSIS — Z1231 Encounter for screening mammogram for malignant neoplasm of breast: Secondary | ICD-10-CM

## 2020-10-01 DIAGNOSIS — Z992 Dependence on renal dialysis: Secondary | ICD-10-CM | POA: Diagnosis not present

## 2020-10-01 DIAGNOSIS — N2581 Secondary hyperparathyroidism of renal origin: Secondary | ICD-10-CM | POA: Diagnosis not present

## 2020-10-01 DIAGNOSIS — N186 End stage renal disease: Secondary | ICD-10-CM | POA: Diagnosis not present

## 2020-10-02 DIAGNOSIS — I776 Arteritis, unspecified: Secondary | ICD-10-CM | POA: Diagnosis not present

## 2020-10-02 DIAGNOSIS — Z9181 History of falling: Secondary | ICD-10-CM | POA: Diagnosis not present

## 2020-10-02 DIAGNOSIS — G8929 Other chronic pain: Secondary | ICD-10-CM | POA: Diagnosis not present

## 2020-10-02 DIAGNOSIS — L309 Dermatitis, unspecified: Secondary | ICD-10-CM | POA: Diagnosis not present

## 2020-10-02 DIAGNOSIS — G63 Polyneuropathy in diseases classified elsewhere: Secondary | ICD-10-CM | POA: Diagnosis not present

## 2020-10-02 DIAGNOSIS — N186 End stage renal disease: Secondary | ICD-10-CM | POA: Diagnosis not present

## 2020-10-03 ENCOUNTER — Other Ambulatory Visit: Payer: Self-pay

## 2020-10-03 ENCOUNTER — Encounter: Payer: Self-pay | Admitting: Neurology

## 2020-10-03 ENCOUNTER — Ambulatory Visit
Admission: RE | Admit: 2020-10-03 | Discharge: 2020-10-03 | Disposition: A | Discharge status: Home / Self Care | Payer: Medicare PPO | Source: Ambulatory Visit

## 2020-10-03 DIAGNOSIS — Z1231 Encounter for screening mammogram for malignant neoplasm of breast: Secondary | ICD-10-CM | POA: Diagnosis not present

## 2020-10-03 DIAGNOSIS — Z992 Dependence on renal dialysis: Secondary | ICD-10-CM | POA: Diagnosis not present

## 2020-10-03 DIAGNOSIS — N186 End stage renal disease: Secondary | ICD-10-CM | POA: Diagnosis not present

## 2020-10-03 DIAGNOSIS — N2581 Secondary hyperparathyroidism of renal origin: Secondary | ICD-10-CM | POA: Diagnosis not present

## 2020-10-04 ENCOUNTER — Other Ambulatory Visit: Payer: Self-pay

## 2020-10-04 DIAGNOSIS — R202 Paresthesia of skin: Secondary | ICD-10-CM

## 2020-10-05 DIAGNOSIS — N2581 Secondary hyperparathyroidism of renal origin: Secondary | ICD-10-CM | POA: Diagnosis not present

## 2020-10-05 DIAGNOSIS — Z992 Dependence on renal dialysis: Secondary | ICD-10-CM | POA: Diagnosis not present

## 2020-10-05 DIAGNOSIS — N186 End stage renal disease: Secondary | ICD-10-CM | POA: Diagnosis not present

## 2020-10-08 DIAGNOSIS — N186 End stage renal disease: Secondary | ICD-10-CM | POA: Diagnosis not present

## 2020-10-08 DIAGNOSIS — Z992 Dependence on renal dialysis: Secondary | ICD-10-CM | POA: Diagnosis not present

## 2020-10-08 DIAGNOSIS — N2581 Secondary hyperparathyroidism of renal origin: Secondary | ICD-10-CM | POA: Diagnosis not present

## 2020-10-10 DIAGNOSIS — N186 End stage renal disease: Secondary | ICD-10-CM | POA: Diagnosis not present

## 2020-10-10 DIAGNOSIS — Z992 Dependence on renal dialysis: Secondary | ICD-10-CM | POA: Diagnosis not present

## 2020-10-10 DIAGNOSIS — N2581 Secondary hyperparathyroidism of renal origin: Secondary | ICD-10-CM | POA: Diagnosis not present

## 2020-10-11 DIAGNOSIS — I871 Compression of vein: Secondary | ICD-10-CM | POA: Diagnosis not present

## 2020-10-11 DIAGNOSIS — Z992 Dependence on renal dialysis: Secondary | ICD-10-CM | POA: Diagnosis not present

## 2020-10-11 DIAGNOSIS — T82858A Stenosis of vascular prosthetic devices, implants and grafts, initial encounter: Secondary | ICD-10-CM | POA: Diagnosis not present

## 2020-10-11 DIAGNOSIS — N186 End stage renal disease: Secondary | ICD-10-CM | POA: Diagnosis not present

## 2020-10-12 DIAGNOSIS — N186 End stage renal disease: Secondary | ICD-10-CM | POA: Diagnosis not present

## 2020-10-12 DIAGNOSIS — Z992 Dependence on renal dialysis: Secondary | ICD-10-CM | POA: Diagnosis not present

## 2020-10-12 DIAGNOSIS — N2581 Secondary hyperparathyroidism of renal origin: Secondary | ICD-10-CM | POA: Diagnosis not present

## 2020-10-15 DIAGNOSIS — N186 End stage renal disease: Secondary | ICD-10-CM | POA: Diagnosis not present

## 2020-10-15 DIAGNOSIS — N2581 Secondary hyperparathyroidism of renal origin: Secondary | ICD-10-CM | POA: Diagnosis not present

## 2020-10-15 DIAGNOSIS — Z992 Dependence on renal dialysis: Secondary | ICD-10-CM | POA: Diagnosis not present

## 2020-10-17 DIAGNOSIS — Z992 Dependence on renal dialysis: Secondary | ICD-10-CM | POA: Diagnosis not present

## 2020-10-17 DIAGNOSIS — N2581 Secondary hyperparathyroidism of renal origin: Secondary | ICD-10-CM | POA: Diagnosis not present

## 2020-10-17 DIAGNOSIS — N186 End stage renal disease: Secondary | ICD-10-CM | POA: Diagnosis not present

## 2020-10-19 DIAGNOSIS — N186 End stage renal disease: Secondary | ICD-10-CM | POA: Diagnosis not present

## 2020-10-19 DIAGNOSIS — Z992 Dependence on renal dialysis: Secondary | ICD-10-CM | POA: Diagnosis not present

## 2020-10-19 DIAGNOSIS — N2581 Secondary hyperparathyroidism of renal origin: Secondary | ICD-10-CM | POA: Diagnosis not present

## 2020-10-22 DIAGNOSIS — Z992 Dependence on renal dialysis: Secondary | ICD-10-CM | POA: Diagnosis not present

## 2020-10-22 DIAGNOSIS — N179 Acute kidney failure, unspecified: Secondary | ICD-10-CM | POA: Diagnosis not present

## 2020-10-22 DIAGNOSIS — N186 End stage renal disease: Secondary | ICD-10-CM | POA: Diagnosis not present

## 2020-10-22 DIAGNOSIS — N2581 Secondary hyperparathyroidism of renal origin: Secondary | ICD-10-CM | POA: Diagnosis not present

## 2020-10-24 DIAGNOSIS — Z992 Dependence on renal dialysis: Secondary | ICD-10-CM | POA: Diagnosis not present

## 2020-10-24 DIAGNOSIS — N2581 Secondary hyperparathyroidism of renal origin: Secondary | ICD-10-CM | POA: Diagnosis not present

## 2020-10-24 DIAGNOSIS — N186 End stage renal disease: Secondary | ICD-10-CM | POA: Diagnosis not present

## 2020-10-26 DIAGNOSIS — N2581 Secondary hyperparathyroidism of renal origin: Secondary | ICD-10-CM | POA: Diagnosis not present

## 2020-10-26 DIAGNOSIS — Z992 Dependence on renal dialysis: Secondary | ICD-10-CM | POA: Diagnosis not present

## 2020-10-26 DIAGNOSIS — N186 End stage renal disease: Secondary | ICD-10-CM | POA: Diagnosis not present

## 2020-10-29 DIAGNOSIS — N2581 Secondary hyperparathyroidism of renal origin: Secondary | ICD-10-CM | POA: Diagnosis not present

## 2020-10-29 DIAGNOSIS — Z992 Dependence on renal dialysis: Secondary | ICD-10-CM | POA: Diagnosis not present

## 2020-10-29 DIAGNOSIS — N186 End stage renal disease: Secondary | ICD-10-CM | POA: Diagnosis not present

## 2020-10-31 DIAGNOSIS — Z992 Dependence on renal dialysis: Secondary | ICD-10-CM | POA: Diagnosis not present

## 2020-10-31 DIAGNOSIS — N186 End stage renal disease: Secondary | ICD-10-CM | POA: Diagnosis not present

## 2020-10-31 DIAGNOSIS — N2581 Secondary hyperparathyroidism of renal origin: Secondary | ICD-10-CM | POA: Diagnosis not present

## 2020-11-02 DIAGNOSIS — Z992 Dependence on renal dialysis: Secondary | ICD-10-CM | POA: Diagnosis not present

## 2020-11-02 DIAGNOSIS — N186 End stage renal disease: Secondary | ICD-10-CM | POA: Diagnosis not present

## 2020-11-02 DIAGNOSIS — N2581 Secondary hyperparathyroidism of renal origin: Secondary | ICD-10-CM | POA: Diagnosis not present

## 2020-11-05 DIAGNOSIS — Z992 Dependence on renal dialysis: Secondary | ICD-10-CM | POA: Diagnosis not present

## 2020-11-05 DIAGNOSIS — N186 End stage renal disease: Secondary | ICD-10-CM | POA: Diagnosis not present

## 2020-11-05 DIAGNOSIS — N2581 Secondary hyperparathyroidism of renal origin: Secondary | ICD-10-CM | POA: Diagnosis not present

## 2020-11-07 DIAGNOSIS — N2581 Secondary hyperparathyroidism of renal origin: Secondary | ICD-10-CM | POA: Diagnosis not present

## 2020-11-07 DIAGNOSIS — N186 End stage renal disease: Secondary | ICD-10-CM | POA: Diagnosis not present

## 2020-11-07 DIAGNOSIS — Z992 Dependence on renal dialysis: Secondary | ICD-10-CM | POA: Diagnosis not present

## 2020-11-09 DIAGNOSIS — N2581 Secondary hyperparathyroidism of renal origin: Secondary | ICD-10-CM | POA: Diagnosis not present

## 2020-11-09 DIAGNOSIS — Z992 Dependence on renal dialysis: Secondary | ICD-10-CM | POA: Diagnosis not present

## 2020-11-09 DIAGNOSIS — N186 End stage renal disease: Secondary | ICD-10-CM | POA: Diagnosis not present

## 2020-11-12 DIAGNOSIS — Z992 Dependence on renal dialysis: Secondary | ICD-10-CM | POA: Diagnosis not present

## 2020-11-12 DIAGNOSIS — N186 End stage renal disease: Secondary | ICD-10-CM | POA: Diagnosis not present

## 2020-11-12 DIAGNOSIS — N2581 Secondary hyperparathyroidism of renal origin: Secondary | ICD-10-CM | POA: Diagnosis not present

## 2020-11-13 ENCOUNTER — Other Ambulatory Visit: Payer: Self-pay

## 2020-11-13 ENCOUNTER — Ambulatory Visit: Payer: Medicare PPO | Admitting: Neurology

## 2020-11-13 DIAGNOSIS — Z992 Dependence on renal dialysis: Secondary | ICD-10-CM | POA: Diagnosis not present

## 2020-11-13 DIAGNOSIS — N186 End stage renal disease: Secondary | ICD-10-CM | POA: Diagnosis not present

## 2020-11-13 DIAGNOSIS — R202 Paresthesia of skin: Secondary | ICD-10-CM

## 2020-11-13 DIAGNOSIS — I871 Compression of vein: Secondary | ICD-10-CM | POA: Diagnosis not present

## 2020-11-13 NOTE — Procedures (Signed)
[facility]  [place], [place]  [place], [place] [address] Tel: [phone] Fax:  [phone] Test Date:  11/13/2020  Patient: Jasmine White DOB: May 04, 1946 Physician: Narda Amber, DO  Sex: Female Height: 5\' 6"  Ref Phys: Leeroy Cha, MD  ID#: [id number]   Technician:    Patient Complaints: This is a 75 year old female referred for evaluation of bilateral feet pain and paresthesias.  NCV & EMG Findings: Extensive electrodiagnostic testing of the right lower extremity and additional studies of the left shows:  1. Bilateral sural and superficial peroneal sensory responses are within normal limits. 2. Peroneal motor response is reduced on the right and absent on the left; in isolation, these findings are nonspecific.  Peroneal motor responses at the tibialis anterior are within normal limits.  Bilateral tibial motor responses are within normal limits. 3. Bilateral tibial H reflex studies are within normal limits. 4. There is no evidence of active or chronic motor axonal loss changes affecting any of the tested muscles.  Motor unit configuration and recruitment pattern is within normal limits.  Impression: This is a normal age-appropriate study of the lower extremities.  In particular, there is no evidence of a sensorimotor polyneuropathy or lumbosacral radiculopathy.   ___________________________ Narda Amber, DO    Nerve Conduction Studies Anti Sensory Summary Table   Stim Site NR Peak (ms) Norm Peak (ms) P-T Amp (V) Norm P-T Amp  Left Sup Peroneal Anti Sensory (Ant Lat Mall)  33C  12 cm    2.1 <4.6 6.7 >3  Right Sup Peroneal Anti Sensory (Ant Lat Mall)  33C  12 cm    2.7 <4.6 8.9 >3  Left Sural Anti Sensory (Lat Mall)  33C  Calf    3.1 <4.6 9.5 >3  Right Sural Anti Sensory (Lat Mall)  33C  Calf    2.7 <4.6 9.9 >3   Motor Summary Table   Stim Site NR Onset (ms) Norm Onset (ms) O-P Amp (mV) Norm O-P Amp Site1 Site2 Delta-0 (ms) Dist (cm)  Vel (m/s) Norm Vel (m/s)  Left Peroneal Motor (Ext Dig Brev)  33C  Ankle NR  <6.0  >2.5 B Fib Ankle  0.0  >40  B Fib NR     Poplt B Fib  0.0  >40  Poplt NR            Right Peroneal Motor (Ext Dig Brev)  33C  Ankle    2.7 <6.0 2.4 >2.5 B Fib Ankle 7.9 34.0 43 >40  B Fib    10.6  2.1  Poplt B Fib 1.7 9.0 53 >40  Poplt    12.3  2.0         Left Peroneal TA Motor (Tib Ant)  33C  Fib Head    3.8 <4.5 3.4 >3 Poplit Fib Head 1.5 8.0 53 >40  Poplit    5.3  2.8         Right Peroneal TA Motor (Tib Ant)  33C  Fib Head    4.0 <4.5 3.0 >3 Poplit Fib Head 1.2 8.0 67 >40  Poplit    5.2  2.8         Left Tibial Motor (Abd Hall Brev)  33C  Ankle    3.7 <6.0 6.1 >4 Knee Ankle 7.8 41.0 53 >40  Knee    11.5  5.5         Right Tibial Motor (Abd Hall Brev)  33C  Ankle    3.8 <6.0 6.2 >4  Knee Ankle 9.1 40.0 44 >40  Knee    12.9  4.4          H Reflex Studies   NR H-Lat (ms) Lat Norm (ms) L-R H-Lat (ms)  Left Tibial (Gastroc)  33C     33.06 <35 0.00  Right Tibial (Gastroc)  33C     33.06 <35 0.00   EMG   Side Muscle Ins Act Fibs Psw Fasc Number Recrt Dur Dur. Amp Amp. Poly Poly. Comment  Right AntTibialis Nml Nml Nml Nml Nml Nml Nml Nml Nml Nml Nml Nml N/A  Right Gastroc Nml Nml Nml Nml Nml Nml Nml Nml Nml Nml Nml Nml N/A  Right Flex Dig Long Nml Nml Nml Nml Nml Nml Nml Nml Nml Nml Nml Nml N/A  Right RectFemoris Nml Nml Nml Nml Nml Nml Nml Nml Nml Nml Nml Nml N/A  Right GluteusMed Nml Nml Nml Nml Nml Nml Nml Nml Nml Nml Nml Nml N/A  Left AntTibialis Nml Nml Nml Nml Nml Nml Nml Nml Nml Nml Nml Nml N/A  Left Gastroc Nml Nml Nml Nml Nml Nml Nml Nml Nml Nml Nml Nml N/A  Left Flex Dig Long Nml Nml Nml Nml Nml Nml Nml Nml Nml Nml Nml Nml N/A  Left RectFemoris Nml Nml Nml Nml Nml Nml Nml Nml Nml Nml Nml Nml N/A  Left GluteusMed Nml Nml Nml Nml Nml Nml Nml Nml Nml Nml Nml Nml N/A      Waveforms:

## 2020-11-14 DIAGNOSIS — N186 End stage renal disease: Secondary | ICD-10-CM | POA: Diagnosis not present

## 2020-11-14 DIAGNOSIS — Z992 Dependence on renal dialysis: Secondary | ICD-10-CM | POA: Diagnosis not present

## 2020-11-14 DIAGNOSIS — N2581 Secondary hyperparathyroidism of renal origin: Secondary | ICD-10-CM | POA: Diagnosis not present

## 2020-11-16 DIAGNOSIS — Z992 Dependence on renal dialysis: Secondary | ICD-10-CM | POA: Diagnosis not present

## 2020-11-16 DIAGNOSIS — N186 End stage renal disease: Secondary | ICD-10-CM | POA: Diagnosis not present

## 2020-11-16 DIAGNOSIS — N2581 Secondary hyperparathyroidism of renal origin: Secondary | ICD-10-CM | POA: Diagnosis not present

## 2020-11-19 DIAGNOSIS — N179 Acute kidney failure, unspecified: Secondary | ICD-10-CM | POA: Diagnosis not present

## 2020-11-19 DIAGNOSIS — Z992 Dependence on renal dialysis: Secondary | ICD-10-CM | POA: Diagnosis not present

## 2020-11-19 DIAGNOSIS — N2581 Secondary hyperparathyroidism of renal origin: Secondary | ICD-10-CM | POA: Diagnosis not present

## 2020-11-19 DIAGNOSIS — N186 End stage renal disease: Secondary | ICD-10-CM | POA: Diagnosis not present

## 2020-11-21 DIAGNOSIS — N186 End stage renal disease: Secondary | ICD-10-CM | POA: Diagnosis not present

## 2020-11-21 DIAGNOSIS — Z992 Dependence on renal dialysis: Secondary | ICD-10-CM | POA: Diagnosis not present

## 2020-11-21 DIAGNOSIS — N2581 Secondary hyperparathyroidism of renal origin: Secondary | ICD-10-CM | POA: Diagnosis not present

## 2020-11-23 DIAGNOSIS — Z992 Dependence on renal dialysis: Secondary | ICD-10-CM | POA: Diagnosis not present

## 2020-11-23 DIAGNOSIS — N2581 Secondary hyperparathyroidism of renal origin: Secondary | ICD-10-CM | POA: Diagnosis not present

## 2020-11-23 DIAGNOSIS — N186 End stage renal disease: Secondary | ICD-10-CM | POA: Diagnosis not present

## 2020-11-26 DIAGNOSIS — Z992 Dependence on renal dialysis: Secondary | ICD-10-CM | POA: Diagnosis not present

## 2020-11-26 DIAGNOSIS — N2581 Secondary hyperparathyroidism of renal origin: Secondary | ICD-10-CM | POA: Diagnosis not present

## 2020-11-26 DIAGNOSIS — N186 End stage renal disease: Secondary | ICD-10-CM | POA: Diagnosis not present

## 2020-11-28 DIAGNOSIS — N186 End stage renal disease: Secondary | ICD-10-CM | POA: Diagnosis not present

## 2020-11-28 DIAGNOSIS — N2581 Secondary hyperparathyroidism of renal origin: Secondary | ICD-10-CM | POA: Diagnosis not present

## 2020-11-28 DIAGNOSIS — Z992 Dependence on renal dialysis: Secondary | ICD-10-CM | POA: Diagnosis not present

## 2020-11-30 DIAGNOSIS — Z992 Dependence on renal dialysis: Secondary | ICD-10-CM | POA: Diagnosis not present

## 2020-11-30 DIAGNOSIS — N186 End stage renal disease: Secondary | ICD-10-CM | POA: Diagnosis not present

## 2020-11-30 DIAGNOSIS — N2581 Secondary hyperparathyroidism of renal origin: Secondary | ICD-10-CM | POA: Diagnosis not present

## 2020-12-03 DIAGNOSIS — N2581 Secondary hyperparathyroidism of renal origin: Secondary | ICD-10-CM | POA: Diagnosis not present

## 2020-12-03 DIAGNOSIS — N186 End stage renal disease: Secondary | ICD-10-CM | POA: Diagnosis not present

## 2020-12-03 DIAGNOSIS — Z992 Dependence on renal dialysis: Secondary | ICD-10-CM | POA: Diagnosis not present

## 2020-12-05 DIAGNOSIS — N186 End stage renal disease: Secondary | ICD-10-CM | POA: Diagnosis not present

## 2020-12-05 DIAGNOSIS — N2581 Secondary hyperparathyroidism of renal origin: Secondary | ICD-10-CM | POA: Diagnosis not present

## 2020-12-05 DIAGNOSIS — Z992 Dependence on renal dialysis: Secondary | ICD-10-CM | POA: Diagnosis not present

## 2020-12-07 DIAGNOSIS — N2581 Secondary hyperparathyroidism of renal origin: Secondary | ICD-10-CM | POA: Diagnosis not present

## 2020-12-07 DIAGNOSIS — N186 End stage renal disease: Secondary | ICD-10-CM | POA: Diagnosis not present

## 2020-12-07 DIAGNOSIS — Z992 Dependence on renal dialysis: Secondary | ICD-10-CM | POA: Diagnosis not present

## 2020-12-10 DIAGNOSIS — Z992 Dependence on renal dialysis: Secondary | ICD-10-CM | POA: Diagnosis not present

## 2020-12-10 DIAGNOSIS — N186 End stage renal disease: Secondary | ICD-10-CM | POA: Diagnosis not present

## 2020-12-10 DIAGNOSIS — N2581 Secondary hyperparathyroidism of renal origin: Secondary | ICD-10-CM | POA: Diagnosis not present

## 2020-12-12 ENCOUNTER — Encounter: Payer: Self-pay | Admitting: Primary Care

## 2020-12-12 ENCOUNTER — Ambulatory Visit: Payer: Medicare PPO | Admitting: Primary Care

## 2020-12-12 ENCOUNTER — Ambulatory Visit (INDEPENDENT_AMBULATORY_CARE_PROVIDER_SITE_OTHER): Payer: Medicare PPO

## 2020-12-12 ENCOUNTER — Other Ambulatory Visit: Payer: Self-pay

## 2020-12-12 VITALS — BP 122/78 | HR 88 | Temp 97.5°F | Ht 66.0 in | Wt 120.0 lb

## 2020-12-12 DIAGNOSIS — N189 Chronic kidney disease, unspecified: Secondary | ICD-10-CM | POA: Diagnosis not present

## 2020-12-12 DIAGNOSIS — I7782 Antineutrophilic cytoplasmic antibody (ANCA) vasculitis: Secondary | ICD-10-CM

## 2020-12-12 DIAGNOSIS — I776 Arteritis, unspecified: Secondary | ICD-10-CM

## 2020-12-12 DIAGNOSIS — R0602 Shortness of breath: Secondary | ICD-10-CM

## 2020-12-12 DIAGNOSIS — Z992 Dependence on renal dialysis: Secondary | ICD-10-CM | POA: Diagnosis not present

## 2020-12-12 DIAGNOSIS — N2581 Secondary hyperparathyroidism of renal origin: Secondary | ICD-10-CM | POA: Diagnosis not present

## 2020-12-12 DIAGNOSIS — N186 End stage renal disease: Secondary | ICD-10-CM | POA: Diagnosis not present

## 2020-12-12 LAB — CBC WITH DIFFERENTIAL/PLATELET
Basophils Absolute: 0.1 10*3/uL (ref 0.0–0.1)
Basophils Relative: 1.3 % (ref 0.0–3.0)
Eosinophils Absolute: 0.1 10*3/uL (ref 0.0–0.7)
Eosinophils Relative: 2.1 % (ref 0.0–5.0)
HCT: 30.4 % — ABNORMAL LOW (ref 36.0–46.0)
Hemoglobin: 10 g/dL — ABNORMAL LOW (ref 12.0–15.0)
Lymphocytes Relative: 21.5 % (ref 12.0–46.0)
Lymphs Abs: 1.2 10*3/uL (ref 0.7–4.0)
MCHC: 32.8 g/dL (ref 30.0–36.0)
MCV: 86.8 fl (ref 78.0–100.0)
Monocytes Absolute: 0.7 10*3/uL (ref 0.1–1.0)
Monocytes Relative: 12 % (ref 3.0–12.0)
Neutro Abs: 3.5 10*3/uL (ref 1.4–7.7)
Neutrophils Relative %: 63.1 % (ref 43.0–77.0)
Platelets: 194 10*3/uL (ref 150.0–400.0)
RBC: 3.5 Mil/uL — ABNORMAL LOW (ref 3.87–5.11)
RDW: 17.5 % — ABNORMAL HIGH (ref 11.5–15.5)
WBC: 5.6 10*3/uL (ref 4.0–10.5)

## 2020-12-12 LAB — BASIC METABOLIC PANEL
BUN: 22 mg/dL (ref 6–23)
CO2: 34 mEq/L — ABNORMAL HIGH (ref 19–32)
Calcium: 9.3 mg/dL (ref 8.4–10.5)
Chloride: 92 mEq/L — ABNORMAL LOW (ref 96–112)
Creatinine, Ser: 3.09 mg/dL — ABNORMAL HIGH (ref 0.40–1.20)
GFR: 14.24 mL/min — CL (ref >60.00)
Glucose, Bld: 105 mg/dL — ABNORMAL HIGH (ref 70–99)
Potassium: 3.3 mEq/L — ABNORMAL LOW (ref 3.5–5.1)
Sodium: 137 mEq/L (ref 135–145)

## 2020-12-12 LAB — BRAIN NATRIURETIC PEPTIDE: Pro B Natriuretic peptide (BNP): 3681 pg/mL — ABNORMAL HIGH (ref 0.0–100.0)

## 2020-12-12 NOTE — Assessment & Plan Note (Signed)
-   Continue with HD MWF. She has left AV fistula. No longer on high dose oral prednisone, discontinuing Bactrim DS MWF and heparin flushes

## 2020-12-12 NOTE — Patient Instructions (Addendum)
  Recommendations: - Stop Bactrim (because you are not on high dose prednisone anymore) - Continue Albuterol rescue inhaler 2 puffs every 4-6 hours as needed for breakthrough shortness of breath/wheezing - Hold off on taking more prednisone for now  Orders: - CXR and labs today  Follow-up - Keep apt with Dr. Halford Chessman next week for now

## 2020-12-12 NOTE — Progress Notes (Signed)
@Patient  ID: Jasmine White, female    DOB: [birth year], 75 y.o.   MRN: [id number]  Chief Complaint  Patient presents with  . Shortness of Breath    Reports increased dyspnea over the past few weeks that is worse at night that is relieved with Albuterol use. Last use of Albuterol was 2 nights ago.    Referring provider: Leeroy Cha,*  HPI: 75 year old female, never smoked.  Past medical history significant for hypertension, ANCA associated vasculitis, end-stage renal disease secondary to  Glomerulonephritis, pulmonary edema, respiratory failure with hypoxia, pneumonia, thyroidism, secondary hyperparathyroidism, coagulation defect.  Patient of Dr. Halford Chessman, last seen on 08/01/2020.    12/12/2020  Patient had diffuse alveolar hemorrhage in July 2021 in the setting of ANCA, GMB positive rapidly progressive glomerulonephritis. HRCT on 08/07/2020 showed resolution of previous ASD and bilateral effusions, mild irregular peripheral interstitial opacity throughout the lung.  Pulmonary function testing showed mild restriction with moderate diffusion defect.  Patient presents today for acute visit. Her husband made apt today d/t new nocturnal wheezing that she was experiencing several days ago. He states that she is now quite a bit better and has not had any wheezing the last two nights. She has a cough with clear mucus and dry mouth. She is not limited in her activities at home, she is using stationary bike for exercise. She is able to walk level own pace without shortness of breath. HRCT in November 2021 showed resolution of previous ASD and bilateral effusions. She continues with HD MWF. She is not on prednisone anymore, however, she is still on Bactrim MWF. Denies f/c/s, chest tightness, hemoptysis or reflux.    Allergies  Allergen Reactions  . Hctz [Hydrochlorothiazide] Other (See Comments)    Hx of severe Hyponatremia while taking HCTZ - now contraindicated  . Percocet  [Oxycodone-Acetaminophen] Nausea Only  . Ultram [Tramadol] Nausea Only    Makes her crazy and nauseated    Immunization History  Administered Date(s) Administered  . Fluad Quad(high Dose 65+) 07/07/2019  . Influenza, High Dose Seasonal PF 06/26/2017, 06/30/2018, 06/14/2019  . Influenza,inj,Quad PF,6+ Mos 06/29/2013, 07/06/2014, 06/22/2015  . Moderna Sars-Covid-2 Vaccination 11/24/2019, 12/30/2019, 07/06/2020  . Pneumococcal Conjugate-13 06/29/2015  . Pneumococcal Polysaccharide-23 12/21/2012  . Tdap 12/19/2010    Past Medical History:  Diagnosis Date  . Arthritis   . Bruises easily   . Cancer ([place])    basal skin cancer  . Chronic back pain    HNP/stenosis and radiculopathy  . Hyperlipidemia    takes Pravastatin daily  . Hypertension    takes Hyzaar and toprol daily  . Hypothyroidism    takes Synthroid daily  . Insomnia    takes Elevil nightly  . Joint pain   . Joint swelling   . Low BP    past some sedation  . Nocturia   . PONV (postoperative nausea and vomiting)    with knee replacement b/p dropped  . Sinusitis    finished zpak yesterday  . Urinary frequency     Tobacco History: Social History   Tobacco Use  Smoking Status Never Smoker  Smokeless Tobacco Never Used   Counseling given: Not Answered   Outpatient Medications Prior to Visit  Medication Sig Dispense Refill  . albuterol (VENTOLIN HFA) 108 (90 Base) MCG/ACT inhaler Inhale 2 puffs into the lungs every 6 (six) hours as needed for wheezing or shortness of breath. 8 g 0  . b complex-vitamin c-folic acid (NEPHRO-VITE) 0.8 MG TABS tablet Take 1 tablet  by mouth at bedtime.    Marland Kitchen escitalopram (LEXAPRO) 10 MG tablet Take 10 mg by mouth daily.    Marland Kitchen HYDROcodone-acetaminophen (NORCO/VICODIN) 5-325 MG tablet Take 0.5 tablets by mouth every 6 (six) hours as needed for moderate pain.    . Methoxy PEG-Epoetin Beta (MIRCERA IJ) Mircera    . Multiple Vitamin (MULTI-VITAMIN DAILY PO) SMARTSIG:1 Tablet(s) By Mouth  Daily    . sevelamer carbonate (RENVELA) 800 MG tablet Take 800 mg by mouth 3 (three) times daily with meals.    Marland Kitchen SYNTHROID 88 MCG tablet TAKE 1 TABLET BY MOUTH ONCE DAILY BEFORE BREAKFAST (Patient taking differently: Take 88 mcg by mouth daily before breakfast.) 90 tablet 1  . VITAMIN D PO Take 1 tablet by mouth daily.     . heparin 1000 unit/mL SOLN injection Heparin Sodium (Porcine) 1,000 Units/mL Catheter Lock Arterial    . sulfamethoxazole-trimethoprim (BACTRIM) 400-80 MG tablet Take 1 tablet by mouth every Monday, Wednesday, and Friday. 30 tablet 6  . calcitRIOL (ROCALTROL) 0.25 MCG capsule Take 1 capsule (0.25 mcg total) by mouth every Monday, Wednesday, and Friday with hemodialysis. (Patient not taking: Reported on 12/12/2020) 30 capsule 0  . predniSONE (DELTASONE) 20 MG tablet Take 3 tablets (60 mg total) by mouth daily. (Patient not taking: Reported on 12/12/2020)     No facility-administered medications prior to visit.    Review of Systems  Review of Systems  Constitutional: Negative.   Respiratory: Positive for cough, shortness of breath and wheezing. Negative for chest tightness.   Cardiovascular: Negative for leg swelling.    Physical Exam  BP 122/78   Pulse 88   Temp (!) 97.5 F (36.4 C)   Ht 5\' 6"  (1.676 m)   Wt 120 lb (54.4 kg)   SpO2 96%   BMI 19.37 kg/m  Physical Exam Constitutional:      Appearance: Normal appearance. She is well-developed.     Comments: Thin frame  HENT:     Mouth/Throat:     Mouth: Mucous membranes are moist.     Pharynx: Oropharynx is clear.  Cardiovascular:     Rate and Rhythm: Normal rate and regular rhythm.  Pulmonary:     Effort: Pulmonary effort is normal. No respiratory distress.     Breath sounds: No stridor. No rhonchi or rales.     Comments: Inspiratory wheeze Neurological:     General: No focal deficit present.     Mental Status: She is alert and oriented to person, place, and time. Mental status is at baseline.   Psychiatric:        Mood and Affect: Mood normal.        Behavior: Behavior normal.        Thought Content: Thought content normal.        Judgment: Judgment normal.      Lab Results:  CBC    Component Value Date/Time   WBC 12.2 (H) 05/15/2020 1120   RBC 3.56 (L) 05/15/2020 1120   HGB 12.2 05/17/2020 0721   HGB 9.7 (L) 03/01/2020 0927   HCT 36.0 05/17/2020 0721   HCT 28.8 (L) 03/01/2020 0927   PLT 284.0 05/15/2020 1120   PLT 397 03/01/2020 0927   MCV 96.0 05/15/2020 1120   MCV 79 03/01/2020 0927   MCH 31.1 05/03/2020 0032   MCHC 32.8 05/15/2020 1120   RDW 20.0 (H) 05/15/2020 1120   RDW 14.7 03/01/2020 0927   LYMPHSABS 1.2 05/03/2020 0032   LYMPHSABS 1.3 03/01/2020 1829  MONOABS 0.7 05/03/2020 0032   EOSABS 0.1 05/03/2020 0032   EOSABS 0.2 03/01/2020 0927   BASOSABS 0.0 05/03/2020 0032   BASOSABS 0.1 03/01/2020 0927    BMET    Component Value Date/Time   NA 133 (L) 05/17/2020 0721   NA 128 (L) 03/01/2020 0927   K 3.8 05/17/2020 0721   CL 89 (L) 05/17/2020 0721   CO2 31 05/15/2020 1120   GLUCOSE 101 (H) 05/17/2020 0721   BUN 78 (H) 05/17/2020 0721   BUN 67 (H) 03/01/2020 0927   CREATININE 4.90 (H) 05/17/2020 0721   CREATININE 0.84 12/21/2012 1552   CALCIUM 8.0 (L) 05/15/2020 1120   GFRNONAA 11 (L) 05/03/2020 0032   GFRNONAA 72 12/21/2012 1552   GFRAA 13 (L) 05/03/2020 0032   GFRAA 83 12/21/2012 1552    BNP    Component Value Date/Time   BNP 2,852.3 (H) 04/05/2020 1638    ProBNP No results found for: PROBNP  Imaging: NCV with EMG(electromyography)  Result Date: 11/13/2020 Alda Berthold, DO     11/13/2020  4:14 PM [facility] [address]  [place], [place] [address] Tel: [phone] Fax:  [phone] Test Date:  11/13/2020 Patient: Mitzie Marlar DOB: [birth year] Physician: Narda Amber, DO Sex: Female Height: 5\' 6"  Ref Phys: Leeroy Cha, MD ID#: [id number]   Technician:  Patient Complaints: This is a 75 year old  female referred for evaluation of bilateral feet pain and paresthesias. NCV & EMG Findings: Extensive electrodiagnostic testing of the right lower extremity and additional studies of the left shows: 1. Bilateral sural and superficial peroneal sensory responses are within normal limits. 2. Peroneal motor response is reduced on the right and absent on the left; in isolation, these findings are nonspecific.  Peroneal motor responses at the tibialis anterior are within normal limits.  Bilateral tibial motor responses are within normal limits. 3. Bilateral tibial H reflex studies are within normal limits. 4. There is no evidence of active or chronic motor axonal loss changes affecting any of the tested muscles.  Motor unit configuration and recruitment pattern is within normal limits. Impression: This is a normal age-appropriate study of the lower extremities.  In particular, there is no evidence of a sensorimotor polyneuropathy or lumbosacral radiculopathy. ___________________________ Narda Amber, DO Nerve Conduction Studies Anti Sensory Summary Table  Stim Site NR Peak (ms) Norm Peak (ms) P-T Amp (V) Norm P-T Amp Left Sup Peroneal Anti Sensory (Ant Lat Mall)  33C 12 cm    2.1 <4.6 6.7 >3 Right Sup Peroneal Anti Sensory (Ant Lat Mall)  33C 12 cm    2.7 <4.6 8.9 >3 Left Sural Anti Sensory (Lat Mall)  33C Calf    3.1 <4.6 9.5 >3 Right Sural Anti Sensory (Lat Mall)  33C Calf    2.7 <4.6 9.9 >3 Motor Summary Table  Stim Site NR Onset (ms) Norm Onset (ms) O-P Amp (mV) Norm O-P Amp Site1 Site2 Delta-0 (ms) Dist (cm) Vel (m/s) Norm Vel (m/s) Left Peroneal Motor (Ext Dig Brev)  33C Ankle NR  <6.0  >2.5 B Fib Ankle  0.0  >40 B Fib NR     Poplt B Fib  0.0  >40 Poplt NR           Right Peroneal Motor (Ext Dig Brev)  33C Ankle    2.7 <6.0 2.4 >2.5 B Fib Ankle 7.9 34.0 43 >40 B Fib    10.6  2.1  Poplt B Fib 1.7 9.0 53 >40 Poplt  12.3  2.0        Left Peroneal TA Motor (Tib Ant)  33C Fib Head    3.8 <4.5 3.4 >3 Poplit Fib  Head 1.5 8.0 53 >40 Poplit    5.3  2.8        Right Peroneal TA Motor (Tib Ant)  33C Fib Head    4.0 <4.5 3.0 >3 Poplit Fib Head 1.2 8.0 67 >40 Poplit    5.2  2.8        Left Tibial Motor (Abd Hall Brev)  33C Ankle    3.7 <6.0 6.1 >4 Knee Ankle 7.8 41.0 53 >40 Knee    11.5  5.5        Right Tibial Motor (Abd Hall Brev)  33C Ankle    3.8 <6.0 6.2 >4 Knee Ankle 9.1 40.0 44 >40 Knee    12.9  4.4        H Reflex Studies  NR H-Lat (ms) Lat Norm (ms) L-R H-Lat (ms) Left Tibial (Gastroc)  33C    33.06 <35 0.00 Right Tibial (Gastroc)  33C    33.06 <35 0.00 EMG  Side Muscle Ins Act Fibs Psw Fasc Number Recrt Dur Dur. Amp Amp. Poly Poly. Comment Right AntTibialis Nml Nml Nml Nml Nml Nml Nml Nml Nml Nml Nml Nml N/A Right Gastroc Nml Nml Nml Nml Nml Nml Nml Nml Nml Nml Nml Nml N/A Right Flex Dig Long Nml Nml Nml Nml Nml Nml Nml Nml Nml Nml Nml Nml N/A Right RectFemoris Nml Nml Nml Nml Nml Nml Nml Nml Nml Nml Nml Nml N/A Right GluteusMed Nml Nml Nml Nml Nml Nml Nml Nml Nml Nml Nml Nml N/A Left AntTibialis Nml Nml Nml Nml Nml Nml Nml Nml Nml Nml Nml Nml N/A Left Gastroc Nml Nml Nml Nml Nml Nml Nml Nml Nml Nml Nml Nml N/A Left Flex Dig Long Nml Nml Nml Nml Nml Nml Nml Nml Nml Nml Nml Nml N/A Left RectFemoris Nml Nml Nml Nml Nml Nml Nml Nml Nml Nml Nml Nml N/A Left GluteusMed Nml Nml Nml Nml Nml Nml Nml Nml Nml Nml Nml Nml N/A Waveforms:                 Assessment & Plan:   Dyspnea, unspecified - New nocturnal wheezing and dyspnea starting last week. Symptoms have improved the past two nights without intervention. She had inspiratory wheezing on exam. Checking CXR and basic labs today.   Renal failure - Continue with HD MWF. She has left AV fistula. No longer on high dose oral prednisone, discontinuing Bactrim DS MWF and heparin flushes   ANCA-associated vasculitis ([place]) - Patient had diffuse alveolar hemorrhage in July 2021 in the setting of ANCA, GMB positive rapidly progressive glomerulonephritis - HRCT on  08/07/2020 showed resolution of previous ASD and bilateral effusions, mild irregular peripheral interstitial opacity throughout the lung   Martyn Ehrich, NP 12/12/2020

## 2020-12-12 NOTE — Assessment & Plan Note (Signed)
-   New nocturnal wheezing and dyspnea starting last week. Symptoms have improved the past two nights without intervention. She had inspiratory wheezing on exam. Checking CXR and basic labs today.

## 2020-12-12 NOTE — Assessment & Plan Note (Signed)
-   Patient had diffuse alveolar hemorrhage in July 2021 in the setting of ANCA, GMB positive rapidly progressive glomerulonephritis - HRCT on 08/07/2020 showed resolution of previous ASD and bilateral effusions, mild irregular peripheral interstitial opacity throughout the lung

## 2020-12-13 NOTE — Progress Notes (Signed)
Please let patient know that her BNP (fluid level) was significantly elevated, this is likely related to kidney failure but could mean she has Left ventricular dysfunction of her heart. She did have mild diastolic dysfunction on her echocardiogram from July 2021. May benefit from following up with cardiology if she is established with them. Her potassium was also low-normal. She should eat foods rich in potassium next several days. Kidney function is improved from several months ago.   CXR has not come back yet. We will follow-up on that tomorrow

## 2020-12-13 NOTE — Progress Notes (Signed)
Reviewed and agree with assessment/plan.   Chesley Mires, MD [facility] Pulmonary/Critical Care 12/13/2020, 8:46 AM Pager:  [phone]

## 2020-12-14 ENCOUNTER — Telehealth: Payer: Self-pay

## 2020-12-14 ENCOUNTER — Telehealth: Payer: Self-pay | Admitting: Pulmonary Disease

## 2020-12-14 DIAGNOSIS — Z992 Dependence on renal dialysis: Secondary | ICD-10-CM | POA: Diagnosis not present

## 2020-12-14 DIAGNOSIS — N186 End stage renal disease: Secondary | ICD-10-CM | POA: Diagnosis not present

## 2020-12-14 DIAGNOSIS — J849 Interstitial pulmonary disease, unspecified: Secondary | ICD-10-CM

## 2020-12-14 DIAGNOSIS — N2581 Secondary hyperparathyroidism of renal origin: Secondary | ICD-10-CM | POA: Diagnosis not present

## 2020-12-14 MED ORDER — PREDNISONE 20 MG PO TABS: 20.0000 mg | ORAL_TABLET | Freq: Every day | ORAL | 0 refills | Status: DC

## 2020-12-14 NOTE — Telephone Encounter (Signed)
We can try prednisone 20mg  x 5 days as she had some wheezing on exam. And wait to see what HRCT shows and follow-up with [facility]. Please send RX if spouse is ok with this.

## 2020-12-14 NOTE — Telephone Encounter (Signed)
Called and went over xray results per E. Volanda Napoleon NP with patient. Also went over results with patient daughter, [name] (per [facility] and patient). All questions answered and patient and daughter expressed full understanding. Patient agreeable to HRCT being ordered. Orders placed per Volanda Napoleon NP. Confirmed upcoming scheduled office visit with Dr Halford Chessman on 12/19/2020. Nothing further needed at this time.

## 2020-12-14 NOTE — Progress Notes (Signed)
Called and went over xray result. See telephone note from today at 12:07. Nothing further needed at this time.

## 2020-12-14 NOTE — Telephone Encounter (Signed)
-----   Message from Martyn Ehrich, NP sent at 12/14/2020 11:17 AM EDT ----- Please let patient know CXR showed diffuse interstitial prominence concerning for ILD. No pneumonia or pleural effusion. Please order HRCT re: ILD. I would like it done before her apt with Dr. Halford Chessman on 3/30

## 2020-12-14 NOTE — Progress Notes (Signed)
Please let patient know CXR showed diffuse interstitial prominence concerning for ILD. No pneumonia or pleural effusion. Please order HRCT re: ILD. I would like it done before her apt with Dr. Halford Chessman on 3/30

## 2020-12-14 NOTE — Telephone Encounter (Signed)
Spoke with pt's spouse Ray (dpr on file), states that pt has sob worse at night and is asking for suggestions.  Per 3/23 OV note with Beth, this was discussed and evaluated with labs and a cxr.  Pt is having HRCT on Monday to follow up on lungs, and lab results showed elevated fluid levels.  Re-reviewed lab results/recs, and advised pt to speak to kidney doctor since they manage fluid levels for pt. I also advised for pt to sleep with head of bed elevated.  Spouse expressed understanding.   Beth please advise if you have any additional recs for pt in the meantime.  Thanks!

## 2020-12-14 NOTE — Telephone Encounter (Signed)
Called and spoke with patient's husband. He is ok with the prednisone. RX has been sent to [facility] in [place].   Nothing further needed at time of call.

## 2020-12-17 ENCOUNTER — Other Ambulatory Visit: Payer: Self-pay

## 2020-12-17 ENCOUNTER — Ambulatory Visit (HOSPITAL_COMMUNITY): Payer: Medicare PPO

## 2020-12-17 ENCOUNTER — Ambulatory Visit (HOSPITAL_COMMUNITY)
Admission: RE | Admit: 2020-12-17 | Discharge: 2020-12-17 | Disposition: A | Discharge status: Home / Self Care | Payer: Medicare PPO | Source: Ambulatory Visit | Attending: Primary Care | Admitting: Primary Care

## 2020-12-17 DIAGNOSIS — Z992 Dependence on renal dialysis: Secondary | ICD-10-CM | POA: Diagnosis not present

## 2020-12-17 DIAGNOSIS — I700 Atherosclerosis of aorta: Secondary | ICD-10-CM | POA: Diagnosis not present

## 2020-12-17 DIAGNOSIS — J849 Interstitial pulmonary disease, unspecified: Secondary | ICD-10-CM | POA: Insufficient documentation

## 2020-12-17 DIAGNOSIS — N186 End stage renal disease: Secondary | ICD-10-CM | POA: Diagnosis not present

## 2020-12-17 DIAGNOSIS — N2581 Secondary hyperparathyroidism of renal origin: Secondary | ICD-10-CM | POA: Diagnosis not present

## 2020-12-17 DIAGNOSIS — I2510 Atherosclerotic heart disease of native coronary artery without angina pectoris: Secondary | ICD-10-CM | POA: Diagnosis not present

## 2020-12-17 DIAGNOSIS — I517 Cardiomegaly: Secondary | ICD-10-CM | POA: Diagnosis not present

## 2020-12-17 NOTE — Progress Notes (Signed)
You are seeing her this Wednesday. She in on HD. BNP was > 3,000. CXR showed diffuse interstitial prominence, concerning for potential progressive interstitial lung disease. Orderd HRCT, see below results. I think her sob is related more to her kidney failure/ She did have wheezing on exam, gave her trial oral steriods.

## 2020-12-19 ENCOUNTER — Other Ambulatory Visit: Payer: Self-pay

## 2020-12-19 ENCOUNTER — Ambulatory Visit: Payer: Medicare PPO | Admitting: Pulmonary Disease

## 2020-12-19 ENCOUNTER — Ambulatory Visit: Payer: Medicare PPO | Admitting: Primary Care

## 2020-12-19 ENCOUNTER — Encounter: Payer: Self-pay | Admitting: Pulmonary Disease

## 2020-12-19 VITALS — BP 124/64 | HR 82 | Temp 97.8°F | Ht 66.0 in | Wt 117.4 lb

## 2020-12-19 DIAGNOSIS — I7782 Antineutrophilic cytoplasmic antibody (ANCA) vasculitis: Secondary | ICD-10-CM

## 2020-12-19 DIAGNOSIS — R0602 Shortness of breath: Secondary | ICD-10-CM

## 2020-12-19 DIAGNOSIS — N186 End stage renal disease: Secondary | ICD-10-CM | POA: Diagnosis not present

## 2020-12-19 DIAGNOSIS — J849 Interstitial pulmonary disease, unspecified: Secondary | ICD-10-CM | POA: Diagnosis not present

## 2020-12-19 DIAGNOSIS — I776 Arteritis, unspecified: Secondary | ICD-10-CM | POA: Diagnosis not present

## 2020-12-19 DIAGNOSIS — Z992 Dependence on renal dialysis: Secondary | ICD-10-CM | POA: Diagnosis not present

## 2020-12-19 DIAGNOSIS — N189 Chronic kidney disease, unspecified: Secondary | ICD-10-CM

## 2020-12-19 DIAGNOSIS — N2581 Secondary hyperparathyroidism of renal origin: Secondary | ICD-10-CM | POA: Diagnosis not present

## 2020-12-19 MED ORDER — ALBUTEROL SULFATE HFA 108 (90 BASE) MCG/ACT IN AERS: 2.0000 | INHALATION_SPRAY | Freq: Four times a day (QID) | RESPIRATORY_TRACT | 3 refills | Status: DC | PRN

## 2020-12-19 NOTE — Progress Notes (Signed)
[facility]  Chief Complaint  Patient presents with  . Follow-up    Shortness of breath at rest and with activity which started a couple weeks ago    Constitutional:  BP 124/64 (BP Location: Right Arm, Cuff Size: Normal)   Pulse 82   Temp 97.8 F (36.6 C) (Other (Comment)) Comment (Src): wrist  Ht 5\' 6"  (1.676 m)   Wt 117 lb 6.4 oz (53.3 kg)   SpO2 97% Comment: Room air  BMI 18.95 kg/m   Past Medical History:  Chronic back pain, HTN, Hypothyroidism, CKD, HLD, COVID 11 August 2019  Past Surgical History:  Her  has a past surgical history that includes Back surgery; bil knee replacements; basal cell skin cancer; Lumbar laminectomy/decompression microdiscectomy (08/13/2012); Abdominal hysterectomy; Colonoscopy; IR US Guide Vasc Access Right (03/20/2020); IR Fluoro Guide CV Line Right (03/20/2020); AV fistula placement (Left, 04/02/2020); A/V Fistulagram (N/A, 05/17/2020); PERIPHERAL VASCULAR BALLOON ANGIOPLASTY (Left, 05/17/2020); and Breast biopsy.  Brief Summary:  Jasmine White is a 75 y.o. 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      Subjective:   She saw Derl Barrow earlier this month.  She had a spell in which she had trouble with her breathing.  She was treated with prednisone and felt better.  She has also been getting dialysis.  She was using albuterol then and this helped.  Her CT chest showed Rt > Lt effusions.  She was wearing a back brace when her breathing flared up, and she felt like this limited her breathing.  She is feeling much better now.  She hasn't needed to use albuterol for past several days, and no longer wearing back brace.  She is scheduled for pulmonary appointment at [facility] as part of renal transplant assessment.  Her husband was asking about whether she should get home oxygen set up.  Her pulse oximeter  reading at home is consistently in the mid-90s.  Physical Exam:   Appearance - well kempt, thin  ENMT - no sinus tenderness, no oral exudate, no LAN, Mallampati 4 airway, no stridor  Respiratory - equal breath sounds bilaterally, no wheezing or rales, no dullness with percussion  CV - s1s2 regular rate and rhythm, no murmurs  Ext - no clubbing, no edema, AV graft Lt arm  Skin - no rashes  Psych - normal mood and affect    Serology:   ANCA 03/18/20 >> MPO 10.3 (nl 0 - 9)  GBM Ab 03/18/20 >> 137 (nl 0 - 20)  ANA 6/27 >> positive, ds DNA Ab 50 (nl < 5)  GMB Ab 04/03/20 >> 55  Pulmonary testing:   Rt thoracentesis 04/05/20 >> 900 cc straw colored fluid, glucose 133, protein < 3, LDH 50, WBC 63 (72%M)  Bronchoscopy 04/05/20 >> WBC 125 (68%N)  PFT 07/24/20 >> FEV1 1.71 (74%), FEV1% 84, TLC 3.92 (73%), DLCO 59%  Chest Imaging:   V/Q scan 03/22/20 >> negative  CT chest 03/24/20 >> mosaic areas of GGO and consolidation, mod b/l effusions  CT chest 04/05/20 >> large b/l effusions with compressive ATX, extensive ASD  HRCT 04/16/20 >> mod Rt and small Lt effusions, extensive/dense consolidation  HRCT chest 08/07/20 >> resolution of previous ASD and b/l effusions; mild irregular peripheral interstitial opacity throughout the lungs with a slight apical to basal gradient  HRCT chest 12/17/20 >> Rt greater than Lt pleural effusion, air trapping, atelectasis  Cardiac Tests:   Echo 04/06/20 >>  EF 55 to 60%, grade 1 DD, large Lt effusion, mod MR, mod/severe AR  Social History:  She  reports that she has never smoked. She has never used smokeless tobacco. She reports that she does not drink alcohol and does not use drugs.  Family History:  Her family history includes Alcohol abuse in her father; Arthritis in her brother and brother; Cancer (age of onset: 74) in her brother; Heart disease in her father and mother; Hypertension in her mother; Stroke in her father.     Assessment/Plan:    Interstitial lung disease with Diffuse Alveolar Hemorrhage in July 2021. - in setting of ANCA, GBM positive rapidly progressive glomerulonephritis - no evidence for recurrence on most recent CT chest  Dyspnea. - had episode of shortness of breath earlier in March 2022, likely from pleural effusion and atelectasis - clinically improved - advised her to use incentive spirometer and avoid using devices that would restrict her lung expansion - discussed indications for supplemental oxygen, and explained that being short of breath doesn't always correlate with needing extra oxygen  ESRD in setting of Rapidly Progressive Glomerulonephritis. - followed by Dr. Marval Regal with [facility] - treated with rituximab in Summer of 2021 - she is being assessed at [place] for renal transplant and will have pulmonary assessment there in April 2022 to assess fitness for transplant  Time Spent Involved in Patient Care on Day of Examination:  37 minutes  Follow up:  Patient Instructions  Follow up in 6 months   Medication List:   Allergies as of 12/19/2020      Reactions   Hctz [hydrochlorothiazide] Other (See Comments)   Hx of severe Hyponatremia while taking HCTZ - now contraindicated   Percocet [oxycodone-acetaminophen] Nausea Only   Ultram [tramadol] Nausea Only   Makes her crazy and nauseated      Medication List       Accurate as of December 19, 2020 12:12 PM. If you have any questions, ask your nurse or doctor.        STOP taking these medications   calcitRIOL 0.25 MCG capsule Commonly known as: ROCALTROL Stopped by: Chesley Mires, MD   MIRCERA IJ Stopped by: Chesley Mires, MD   predniSONE 20 MG tablet Commonly known as: DELTASONE Stopped by: Chesley Mires, MD   sulfamethoxazole-trimethoprim 400-80 MG tablet Commonly known as: BACTRIM Stopped by: Chesley Mires, MD     TAKE these medications   albuterol 108 (90 Base) MCG/ACT inhaler Commonly known as: VENTOLIN  HFA Inhale 2 puffs into the lungs every 6 (six) hours as needed for wheezing or shortness of breath.   b complex-vitamin c-folic acid 0.8 MG Tabs tablet Take 1 tablet by mouth at bedtime.   escitalopram 10 MG tablet Commonly known as: LEXAPRO Take 10 mg by mouth daily.   HYDROcodone-acetaminophen 5-325 MG tablet Commonly known as: NORCO/VICODIN Take 0.5 tablets by mouth every 6 (six) hours as needed for moderate pain.   MULTI-VITAMIN DAILY PO SMARTSIG:1 Tablet(s) By Mouth Daily   sevelamer carbonate 800 MG tablet Commonly known as: RENVELA Take 800 mg by mouth 3 (three) times daily with meals.   Synthroid 88 MCG tablet Generic drug: levothyroxine TAKE 1 TABLET BY MOUTH ONCE DAILY BEFORE BREAKFAST What changed:   how much to take  how to take this  when to take this  additional instructions   VITAMIN D PO Take 1 tablet by mouth daily.  Signature:  Chesley Mires, MD [place] Pager - [phone] 12/19/2020, 12:12 PM

## 2020-12-19 NOTE — Patient Instructions (Signed)
Follow up in 6 months 

## 2020-12-20 DIAGNOSIS — N179 Acute kidney failure, unspecified: Secondary | ICD-10-CM | POA: Diagnosis not present

## 2020-12-20 DIAGNOSIS — N186 End stage renal disease: Secondary | ICD-10-CM | POA: Diagnosis not present

## 2020-12-20 DIAGNOSIS — Z992 Dependence on renal dialysis: Secondary | ICD-10-CM | POA: Diagnosis not present

## 2020-12-21 DIAGNOSIS — Z992 Dependence on renal dialysis: Secondary | ICD-10-CM | POA: Diagnosis not present

## 2020-12-21 DIAGNOSIS — N2581 Secondary hyperparathyroidism of renal origin: Secondary | ICD-10-CM | POA: Diagnosis not present

## 2020-12-21 DIAGNOSIS — N186 End stage renal disease: Secondary | ICD-10-CM | POA: Diagnosis not present

## 2020-12-24 DIAGNOSIS — N2581 Secondary hyperparathyroidism of renal origin: Secondary | ICD-10-CM | POA: Diagnosis not present

## 2020-12-24 DIAGNOSIS — Z992 Dependence on renal dialysis: Secondary | ICD-10-CM | POA: Diagnosis not present

## 2020-12-24 DIAGNOSIS — N186 End stage renal disease: Secondary | ICD-10-CM | POA: Diagnosis not present

## 2020-12-26 DIAGNOSIS — Z992 Dependence on renal dialysis: Secondary | ICD-10-CM | POA: Diagnosis not present

## 2020-12-26 DIAGNOSIS — N186 End stage renal disease: Secondary | ICD-10-CM | POA: Diagnosis not present

## 2020-12-26 DIAGNOSIS — N2581 Secondary hyperparathyroidism of renal origin: Secondary | ICD-10-CM | POA: Diagnosis not present

## 2020-12-26 IMAGING — CT CT CHEST HIGH RESOLUTION W/O CM
2 of 5 series · 15 of 36 positions shown, 18 images · non-contrast
Comparison: CT chest, 04/05/2020

CLINICAL DATA: Concern for ILD, severe shortness of breath and
cough for several weeks

EXAM:
CT CHEST WITHOUT CONTRAST
TECHNIQUE: Multidetector CT imaging of the chest was performed following the
standard protocol without intravenous contrast. High resolution
imaging of the lungs, as well as inspiratory and expiratory imaging,
was performed.

[Series 5: high resolution 2.0 b30f · axial · 0.69mm/px · z∈[+1142,+1404]mm · 12 of 145 slices shown, 15 images]
[im 7/145  mediastinal]
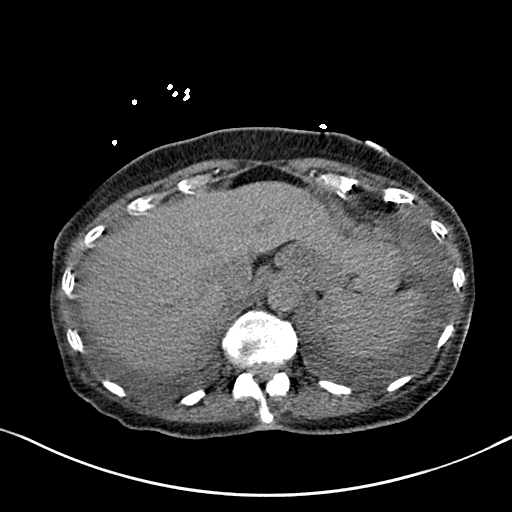
[im 7/145  lung]
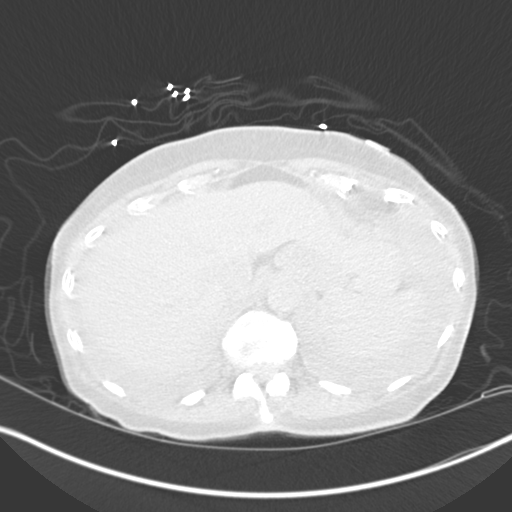
[im 20/145  lung]
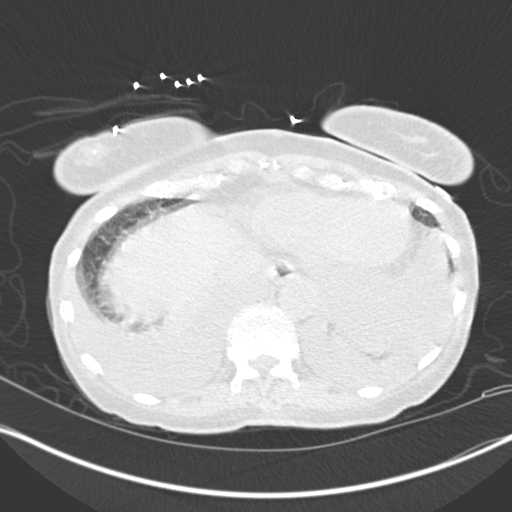
[im 33/145  lung]
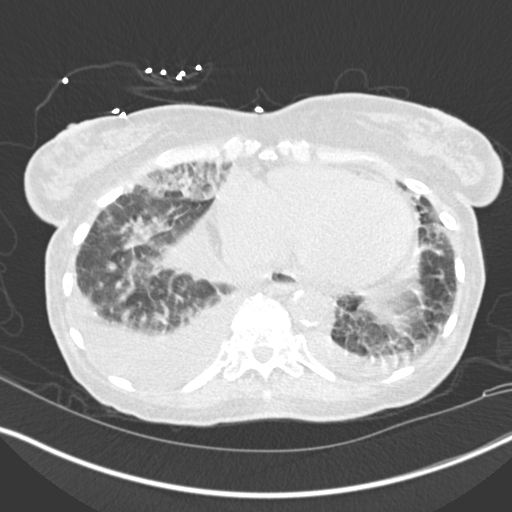
[im 46/145  lung]
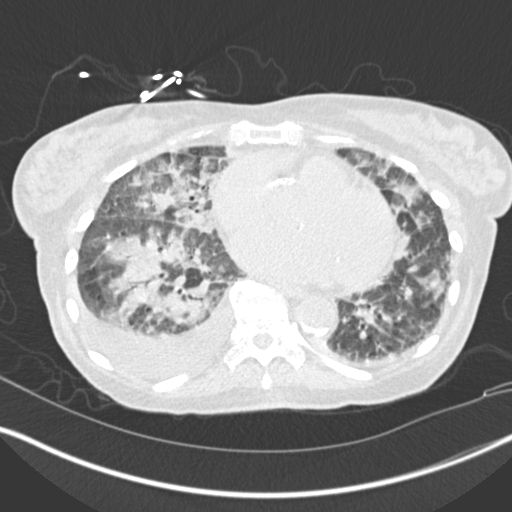
[im 53/145  mediastinal]
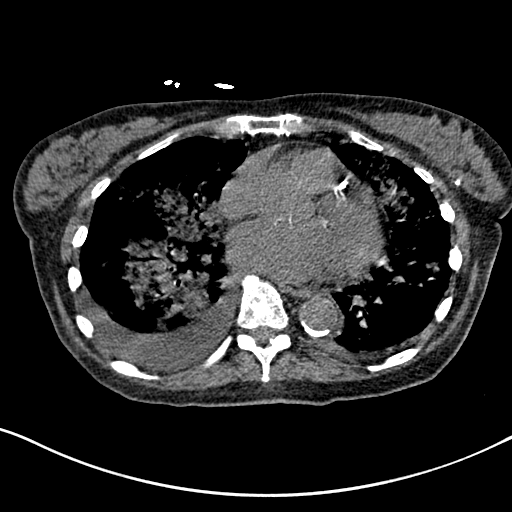
[im 53/145  lung]
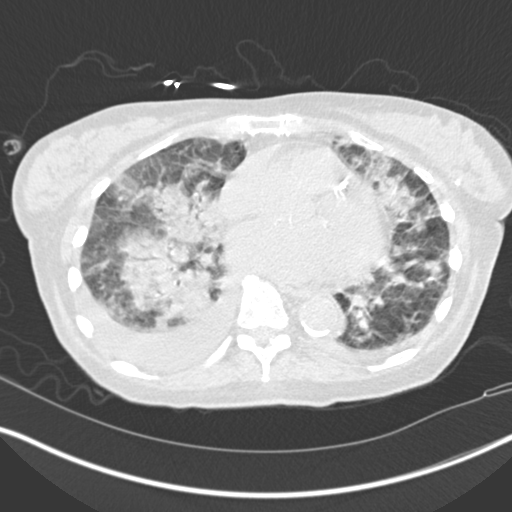
[im 66/145  lung]
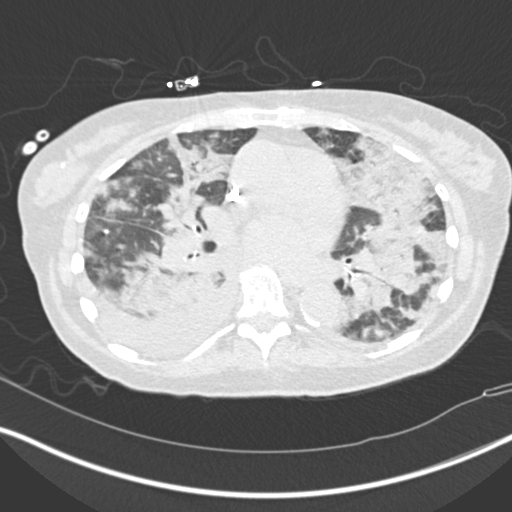
[im 79/145  lung]
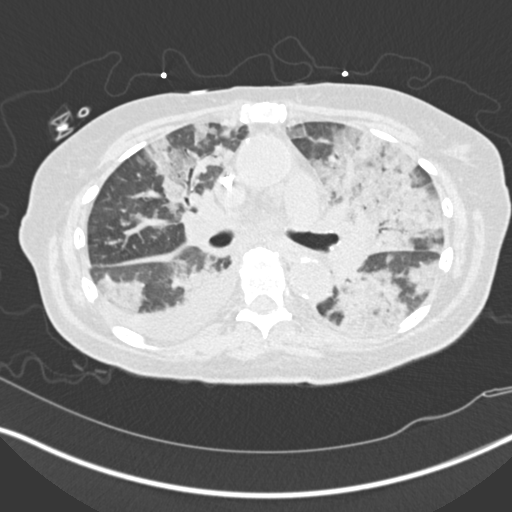
[im 92/145  lung]
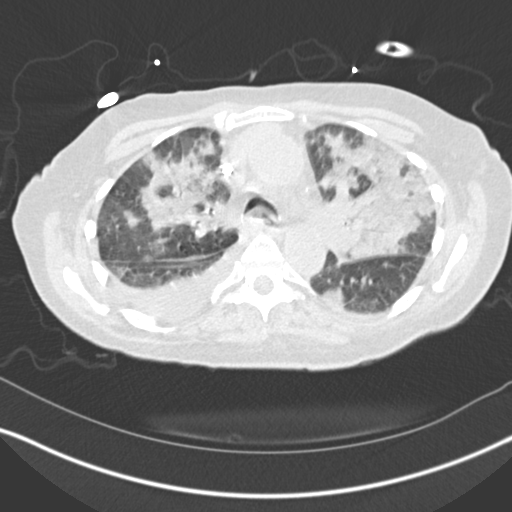
[im 99/145  mediastinal]
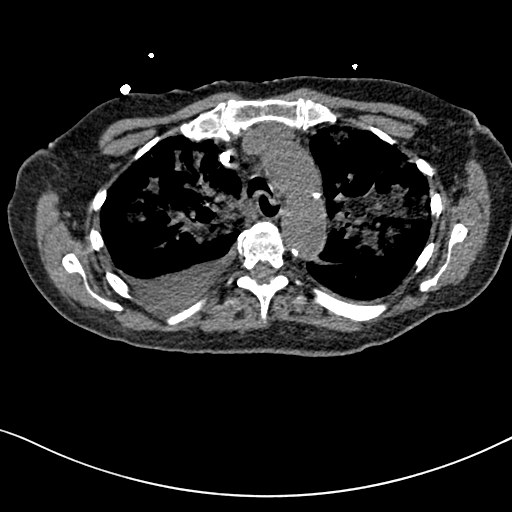
[im 99/145  lung]
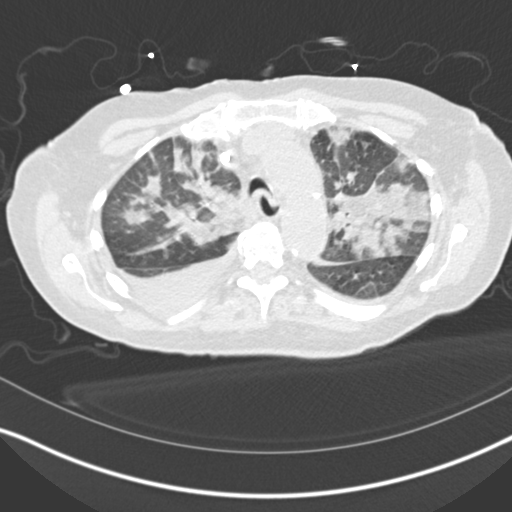
[im 112/145  lung]
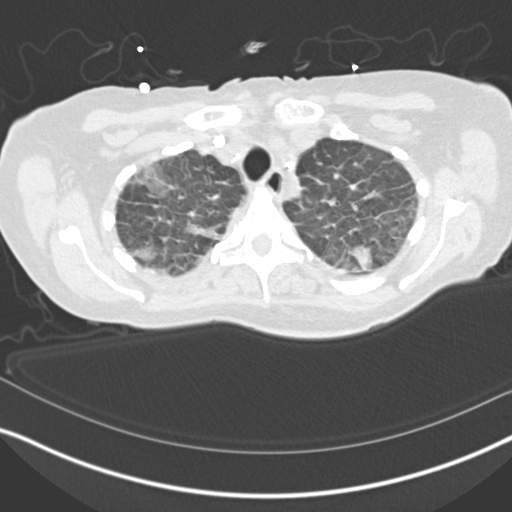
[im 125/145  lung]
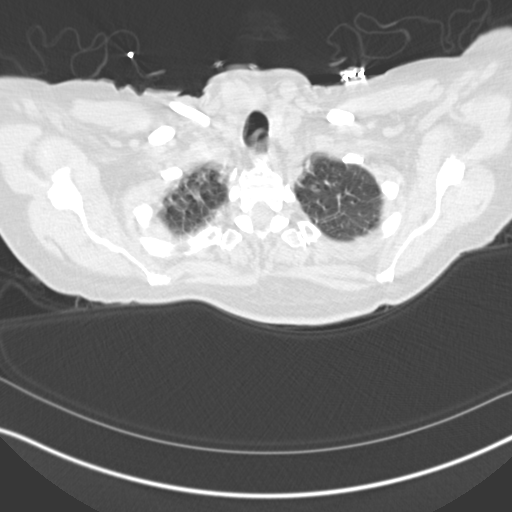
[im 138/145  lung]
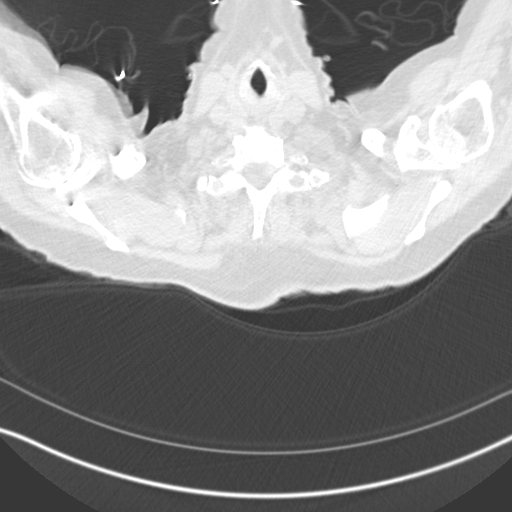

[Series 8: coronal · coronal · 0.61mm/px · 3 of 118 slices shown]
[im 24/118  lung]
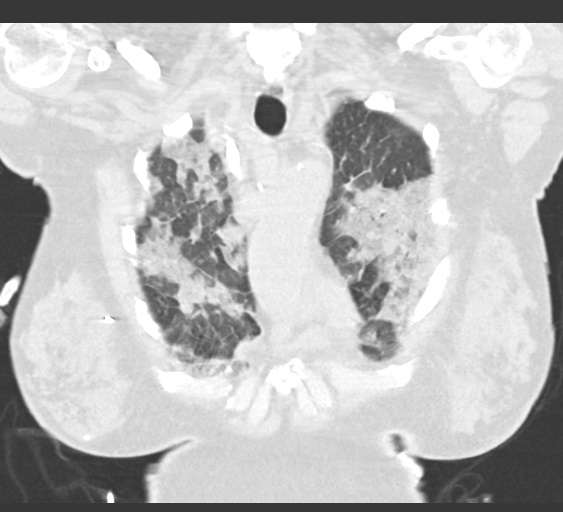
[im 47/118  lung]
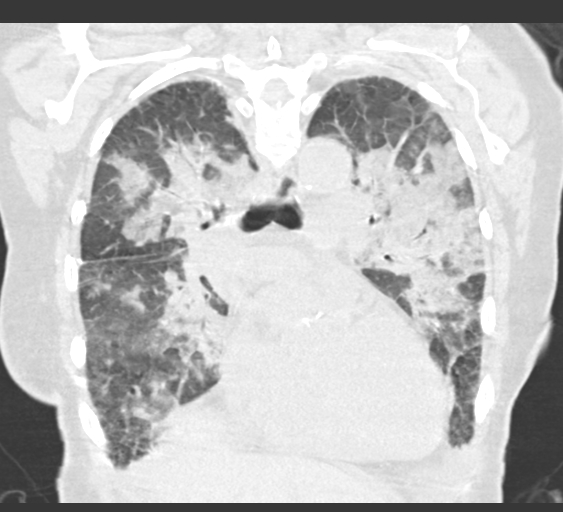
[im 71/118  lung]
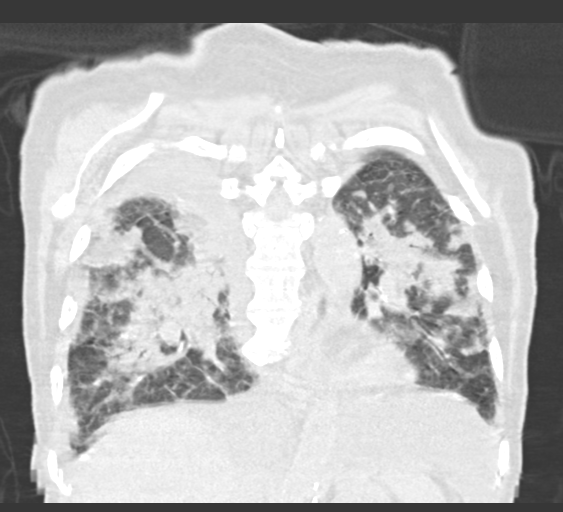

[15 of 36 positions shown; findings below may reference images not displayed]

FINDINGS: Cardiovascular: Large-bore right neck multi lumen vascular catheter.
Aortic atherosclerosis. Normal heart size. Scattered three-vessel
coronary artery calcifications. No pericardial effusion.

Mediastinum/Nodes: No enlarged mediastinal, hilar, or axillary lymph
nodes. Thyroid gland, trachea, and esophagus demonstrate no
significant findings.

Lungs/Pleura: Moderate right, small left pleural effusions are
slightly decreased compared to prior examination dated 04/05/2020.
There is very extensive, dense, somewhat geographic heterogeneous
and consolidative airspace disease throughout the lungs, which is
significantly worsened compared to prior examination. There is
interlobular septal thickening throughout, which is similar to prior
examination.

Upper Abdomen: No acute abnormality.

Musculoskeletal: No chest wall mass or suspicious bone lesions
identified.
IMPRESSION: 1. There is very extensive, dense, somewhat geographic heterogeneous
and consolidative airspace disease throughout the lungs, which is
significantly worsened compared to prior examination dated
04/05/2020. There is interlobular septal thickening throughout,
which is similar to prior examination. Findings are most consistent
with worsened multifocal infection, with underlying edema.
2. Moderate right, small left pleural effusions are slightly
decreased compared to prior examination dated 04/05/2020.
[DATE]. There are no obvious features of interstitial lung disease,
however examination is very limited by the presence of extensive
acute airspace opacity and pleural effusions. Follow-up examination
could be performed at the resolution of acute presentation.
4. Coronary artery disease.  Aortic Atherosclerosis ([id number]).

## 2020-12-28 DIAGNOSIS — Z992 Dependence on renal dialysis: Secondary | ICD-10-CM | POA: Diagnosis not present

## 2020-12-28 DIAGNOSIS — N186 End stage renal disease: Secondary | ICD-10-CM | POA: Diagnosis not present

## 2020-12-28 DIAGNOSIS — N2581 Secondary hyperparathyroidism of renal origin: Secondary | ICD-10-CM | POA: Diagnosis not present

## 2020-12-31 DIAGNOSIS — N2581 Secondary hyperparathyroidism of renal origin: Secondary | ICD-10-CM | POA: Diagnosis not present

## 2020-12-31 DIAGNOSIS — N186 End stage renal disease: Secondary | ICD-10-CM | POA: Diagnosis not present

## 2020-12-31 DIAGNOSIS — Z992 Dependence on renal dialysis: Secondary | ICD-10-CM | POA: Diagnosis not present

## 2021-01-02 DIAGNOSIS — Z992 Dependence on renal dialysis: Secondary | ICD-10-CM | POA: Diagnosis not present

## 2021-01-02 DIAGNOSIS — N186 End stage renal disease: Secondary | ICD-10-CM | POA: Diagnosis not present

## 2021-01-02 DIAGNOSIS — N2581 Secondary hyperparathyroidism of renal origin: Secondary | ICD-10-CM | POA: Diagnosis not present

## 2021-01-04 DIAGNOSIS — N2581 Secondary hyperparathyroidism of renal origin: Secondary | ICD-10-CM | POA: Diagnosis not present

## 2021-01-04 DIAGNOSIS — Z992 Dependence on renal dialysis: Secondary | ICD-10-CM | POA: Diagnosis not present

## 2021-01-04 DIAGNOSIS — N186 End stage renal disease: Secondary | ICD-10-CM | POA: Diagnosis not present

## 2021-01-07 DIAGNOSIS — Z992 Dependence on renal dialysis: Secondary | ICD-10-CM | POA: Diagnosis not present

## 2021-01-07 DIAGNOSIS — N186 End stage renal disease: Secondary | ICD-10-CM | POA: Diagnosis not present

## 2021-01-07 DIAGNOSIS — N2581 Secondary hyperparathyroidism of renal origin: Secondary | ICD-10-CM | POA: Diagnosis not present

## 2021-01-08 ENCOUNTER — Other Ambulatory Visit (HOSPITAL_COMMUNITY): Payer: Medicare PPO

## 2021-01-09 DIAGNOSIS — N2581 Secondary hyperparathyroidism of renal origin: Secondary | ICD-10-CM | POA: Diagnosis not present

## 2021-01-09 DIAGNOSIS — N186 End stage renal disease: Secondary | ICD-10-CM | POA: Diagnosis not present

## 2021-01-09 DIAGNOSIS — Z992 Dependence on renal dialysis: Secondary | ICD-10-CM | POA: Diagnosis not present

## 2021-01-11 ENCOUNTER — Telehealth: Payer: Self-pay | Admitting: Pulmonary Disease

## 2021-01-11 DIAGNOSIS — J849 Interstitial pulmonary disease, unspecified: Secondary | ICD-10-CM

## 2021-01-11 DIAGNOSIS — N2581 Secondary hyperparathyroidism of renal origin: Secondary | ICD-10-CM | POA: Diagnosis not present

## 2021-01-11 DIAGNOSIS — N186 End stage renal disease: Secondary | ICD-10-CM | POA: Diagnosis not present

## 2021-01-11 DIAGNOSIS — Z992 Dependence on renal dialysis: Secondary | ICD-10-CM | POA: Diagnosis not present

## 2021-01-11 NOTE — Telephone Encounter (Signed)
Please arrange for overnight oximetry on room air.  We could see if she qualifies for home oxygen, and then they wouldn't have to pay for it.  Please check if they are okay with going this route, and if they are then please place order for ONO with room air.

## 2021-01-11 NOTE — Telephone Encounter (Signed)
Called and updated patient on Dr Halford Chessman ok to place order for home oxygen POC at 2 Liters. Patient confirmed she wanted script sent to [facility]. Placed in orders. Patient stated nothing further needed at this time. PCC's aware of order being placed. Nothing further needed at this time.

## 2021-01-11 NOTE — Telephone Encounter (Signed)
Primary Pulmonologist: Dr Halford Chessman Last office visit and with whom: 12/19/2020 What do we see them for (pulmonary problems): Interstitial Pulmonary disease, sob, ANCA-associated vasculitis Last OV assessment/plan: Interstitial lung disease with Diffuse Alveolar Hemorrhage in July 2021. - in setting of ANCA, GBM positive rapidly progressive glomerulonephritis - no evidence for recurrence on most recent CT chest  Dyspnea. - had episode of shortness of breath earlier in March 2022, likely from pleural effusion and atelectasis - clinically improved - advised her to use incentive spirometer and avoid using devices that would restrict her lung expansion - discussed indications for supplemental oxygen, and explained that being short of breath doesn't always correlate with needing extra oxygen  ESRD in setting of Rapidly Progressive Glomerulonephritis. - followed by Dr. Marval Regal with [facility] - treated with rituximab in Summer of 2021 - she is being assessed at [place] for renal transplant and will have pulmonary assessment there in April 2022 to assess fitness for transplant.   Reason for call: Spoke with patient and Pt's husband who states patient having shortness of breath which started a couple weeks ago. Patient stated checks O2 sat which have been between 95%-100% when feeling short of breath. Patient husband states patient has wheezing at times mainly at night but not every night. Intermittent non productive cough which came after started having shortness of breath.  Denies any fever, chills or other symptoms. Patient states uses inhaler which does not help much. Patient states she can't lay down in the bed and is now struggling to breathe at night even when sitting up in recliner chair at night.  They are requesting Dr. Halford Chessman to go ahead and put in the order to [facility] for the oxygen unit (concentrator). They state they will pay for it.   Dr Halford Chessman  please advise.   Allergies  Allergen Reactions  . Hctz [Hydrochlorothiazide] Other (See Comments)    Hx of severe Hyponatremia while taking HCTZ - now contraindicated  . Percocet [Oxycodone-Acetaminophen] Nausea Only  . Ultram [Tramadol] Nausea Only    Makes her crazy and nauseated    Immunization History  Administered Date(s) Administered  . Fluad Quad(high Dose 65+) 07/07/2019  . Influenza, High Dose Seasonal PF 06/26/2017, 06/30/2018, 06/14/2019, 06/05/2020  . Influenza,inj,Quad PF,6+ Mos 06/29/2013, 07/06/2014, 06/22/2015  . Moderna Sars-Covid-2 Vaccination 11/24/2019, 12/30/2019, 07/06/2020  . Pneumococcal Conjugate-13 06/29/2015  . Pneumococcal Polysaccharide-23 12/21/2012  . Tdap 12/19/2010

## 2021-01-11 NOTE — Telephone Encounter (Signed)
Called and spoke with patient with Dr Juanetta Gosling recommendation. Patient and husband stated they would prefer to pay for the oxygen and not do the ONO. They are wanting the prescription for the oxygen concentrator to be sent in to [facility].   Dr Halford Chessman please advise.

## 2021-01-11 NOTE — Telephone Encounter (Signed)
Okay.  Please send order for her to get home oxygen POC at 2 liters.

## 2021-01-14 ENCOUNTER — Telehealth: Payer: Self-pay | Admitting: Pulmonary Disease

## 2021-01-14 DIAGNOSIS — N2581 Secondary hyperparathyroidism of renal origin: Secondary | ICD-10-CM | POA: Diagnosis not present

## 2021-01-14 DIAGNOSIS — N186 End stage renal disease: Secondary | ICD-10-CM | POA: Diagnosis not present

## 2021-01-14 DIAGNOSIS — Z992 Dependence on renal dialysis: Secondary | ICD-10-CM | POA: Diagnosis not present

## 2021-01-14 NOTE — Telephone Encounter (Signed)
Spoke with the pt's spouse ok per DPR  He states pt having increased SOB x 2 wks- happens with and without exertion  She checks sats on RA and they are always above 90% ra  She states also coughing a little more then usual lately  No fever  OV with [facility] in [facility] 01/15/21 Seek emergent care sooner if needed

## 2021-01-15 ENCOUNTER — Emergency Department (HOSPITAL_COMMUNITY): Payer: Medicare PPO

## 2021-01-15 ENCOUNTER — Encounter (HOSPITAL_COMMUNITY): Payer: Self-pay | Admitting: Emergency Medicine

## 2021-01-15 ENCOUNTER — Encounter: Payer: Self-pay | Admitting: Internal Medicine

## 2021-01-15 ENCOUNTER — Other Ambulatory Visit: Payer: Self-pay

## 2021-01-15 ENCOUNTER — Ambulatory Visit: Payer: Medicare PPO | Admitting: Internal Medicine

## 2021-01-15 ENCOUNTER — Inpatient Hospital Stay (HOSPITAL_COMMUNITY)
Admission: EM | Admit: 2021-01-15 | Discharge: 2021-01-17 | DRG: 286 | Disposition: A | Discharge status: Home Health | Payer: Medicare PPO | Attending: Family Medicine | Admitting: Family Medicine

## 2021-01-15 DIAGNOSIS — R778 Other specified abnormalities of plasma proteins: Secondary | ICD-10-CM | POA: Diagnosis not present

## 2021-01-15 DIAGNOSIS — Z8616 Personal history of COVID-19: Secondary | ICD-10-CM | POA: Diagnosis not present

## 2021-01-15 DIAGNOSIS — E8889 Other specified metabolic disorders: Secondary | ICD-10-CM | POA: Diagnosis present

## 2021-01-15 DIAGNOSIS — Z681 Body mass index (BMI) 19 or less, adult: Secondary | ICD-10-CM | POA: Diagnosis not present

## 2021-01-15 DIAGNOSIS — J8410 Pulmonary fibrosis, unspecified: Secondary | ICD-10-CM | POA: Diagnosis present

## 2021-01-15 DIAGNOSIS — N2581 Secondary hyperparathyroidism of renal origin: Secondary | ICD-10-CM | POA: Diagnosis not present

## 2021-01-15 DIAGNOSIS — E785 Hyperlipidemia, unspecified: Secondary | ICD-10-CM | POA: Diagnosis present

## 2021-01-15 DIAGNOSIS — G4700 Insomnia, unspecified: Secondary | ICD-10-CM | POA: Diagnosis present

## 2021-01-15 DIAGNOSIS — Z85828 Personal history of other malignant neoplasm of skin: Secondary | ICD-10-CM | POA: Diagnosis not present

## 2021-01-15 DIAGNOSIS — E039 Hypothyroidism, unspecified: Secondary | ICD-10-CM | POA: Diagnosis present

## 2021-01-15 DIAGNOSIS — N250 Renal osteodystrophy: Secondary | ICD-10-CM | POA: Diagnosis not present

## 2021-01-15 DIAGNOSIS — Z96653 Presence of artificial knee joint, bilateral: Secondary | ICD-10-CM | POA: Diagnosis present

## 2021-01-15 DIAGNOSIS — Z8249 Family history of ischemic heart disease and other diseases of the circulatory system: Secondary | ICD-10-CM | POA: Diagnosis not present

## 2021-01-15 DIAGNOSIS — I5023 Acute on chronic systolic (congestive) heart failure: Secondary | ICD-10-CM | POA: Diagnosis not present

## 2021-01-15 DIAGNOSIS — E43 Unspecified severe protein-calorie malnutrition: Secondary | ICD-10-CM | POA: Diagnosis not present

## 2021-01-15 DIAGNOSIS — Z20822 Contact with and (suspected) exposure to covid-19: Secondary | ICD-10-CM | POA: Diagnosis not present

## 2021-01-15 DIAGNOSIS — I120 Hypertensive chronic kidney disease with stage 5 chronic kidney disease or end stage renal disease: Secondary | ICD-10-CM | POA: Diagnosis not present

## 2021-01-15 DIAGNOSIS — Z809 Family history of malignant neoplasm, unspecified: Secondary | ICD-10-CM

## 2021-01-15 DIAGNOSIS — Z886 Allergy status to analgesic agent status: Secondary | ICD-10-CM | POA: Diagnosis not present

## 2021-01-15 DIAGNOSIS — Z79890 Hormone replacement therapy (postmenopausal): Secondary | ICD-10-CM | POA: Diagnosis not present

## 2021-01-15 DIAGNOSIS — Z888 Allergy status to other drugs, medicaments and biological substances status: Secondary | ICD-10-CM | POA: Diagnosis not present

## 2021-01-15 DIAGNOSIS — D631 Anemia in chronic kidney disease: Secondary | ICD-10-CM | POA: Diagnosis not present

## 2021-01-15 DIAGNOSIS — J810 Acute pulmonary edema: Secondary | ICD-10-CM

## 2021-01-15 DIAGNOSIS — I5031 Acute diastolic (congestive) heart failure: Secondary | ICD-10-CM

## 2021-01-15 DIAGNOSIS — Z79899 Other long term (current) drug therapy: Secondary | ICD-10-CM

## 2021-01-15 DIAGNOSIS — I509 Heart failure, unspecified: Secondary | ICD-10-CM

## 2021-01-15 DIAGNOSIS — Z992 Dependence on renal dialysis: Secondary | ICD-10-CM

## 2021-01-15 DIAGNOSIS — Z885 Allergy status to narcotic agent status: Secondary | ICD-10-CM

## 2021-01-15 DIAGNOSIS — N186 End stage renal disease: Secondary | ICD-10-CM | POA: Diagnosis present

## 2021-01-15 DIAGNOSIS — I083 Combined rheumatic disorders of mitral, aortic and tricuspid valves: Secondary | ICD-10-CM | POA: Diagnosis present

## 2021-01-15 DIAGNOSIS — N059 Unspecified nephritic syndrome with unspecified morphologic changes: Secondary | ICD-10-CM | POA: Diagnosis present

## 2021-01-15 DIAGNOSIS — E8779 Other fluid overload: Secondary | ICD-10-CM | POA: Diagnosis not present

## 2021-01-15 DIAGNOSIS — R0602 Shortness of breath: Secondary | ICD-10-CM | POA: Diagnosis not present

## 2021-01-15 DIAGNOSIS — I5021 Acute systolic (congestive) heart failure: Secondary | ICD-10-CM | POA: Diagnosis not present

## 2021-01-15 DIAGNOSIS — J811 Chronic pulmonary edema: Secondary | ICD-10-CM | POA: Diagnosis not present

## 2021-01-15 DIAGNOSIS — I132 Hypertensive heart and chronic kidney disease with heart failure and with stage 5 chronic kidney disease, or end stage renal disease: Principal | ICD-10-CM | POA: Diagnosis present

## 2021-01-15 DIAGNOSIS — I517 Cardiomegaly: Secondary | ICD-10-CM | POA: Diagnosis not present

## 2021-01-15 DIAGNOSIS — J8489 Other specified interstitial pulmonary diseases: Secondary | ICD-10-CM | POA: Diagnosis not present

## 2021-01-15 DIAGNOSIS — I340 Nonrheumatic mitral (valve) insufficiency: Secondary | ICD-10-CM | POA: Diagnosis not present

## 2021-01-15 DIAGNOSIS — J90 Pleural effusion, not elsewhere classified: Secondary | ICD-10-CM | POA: Diagnosis not present

## 2021-01-15 LAB — COMPREHENSIVE METABOLIC PANEL
ALT: 25 U/L (ref 0–44)
AST: 51 U/L — ABNORMAL HIGH (ref 15–41)
Albumin: 3.8 g/dL (ref 3.5–5.0)
Alkaline Phosphatase: 138 U/L — ABNORMAL HIGH (ref 38–126)
Anion gap: 12 (ref 5–15)
BUN: 46 mg/dL — ABNORMAL HIGH (ref 8–23)
CO2: 32 mmol/L (ref 22–32)
Calcium: 10 mg/dL (ref 8.9–10.3)
Chloride: 88 mmol/L — ABNORMAL LOW (ref 98–111)
Creatinine, Ser: 5.43 mg/dL — ABNORMAL HIGH (ref 0.44–1.00)
GFR, Estimated: 8 mL/min — ABNORMAL LOW (ref >60)
Glucose, Bld: 95 mg/dL (ref 70–99)
Potassium: 3.6 mmol/L (ref 3.5–5.1)
Sodium: 132 mmol/L — ABNORMAL LOW (ref 135–145)
Total Bilirubin: 1.1 mg/dL (ref 0.3–1.2)
Total Protein: 6.7 g/dL (ref 6.5–8.1)

## 2021-01-15 LAB — CBC WITH DIFFERENTIAL/PLATELET
Abs Immature Granulocytes: 0.01 10*3/uL (ref 0.00–0.07)
Basophils Absolute: 0.1 10*3/uL (ref 0.0–0.1)
Basophils Relative: 1 %
Eosinophils Absolute: 0.1 10*3/uL (ref 0.0–0.5)
Eosinophils Relative: 2 %
HCT: 37.3 % (ref 36.0–46.0)
Hemoglobin: 11.7 g/dL — ABNORMAL LOW (ref 12.0–15.0)
Immature Granulocytes: 0 %
Lymphocytes Relative: 21 %
Lymphs Abs: 1.2 10*3/uL (ref 0.7–4.0)
MCH: 30 pg (ref 26.0–34.0)
MCHC: 31.4 g/dL (ref 30.0–36.0)
MCV: 95.6 fL (ref 80.0–100.0)
Monocytes Absolute: 0.5 10*3/uL (ref 0.1–1.0)
Monocytes Relative: 9 %
Neutro Abs: 3.8 10*3/uL (ref 1.7–7.7)
Neutrophils Relative %: 67 %
Platelets: 163 10*3/uL (ref 150–400)
RBC: 3.9 MIL/uL (ref 3.87–5.11)
RDW: 17.1 % — ABNORMAL HIGH (ref 11.5–15.5)
WBC: 5.7 10*3/uL (ref 4.0–10.5)
nRBC: 0 % (ref 0.0–0.2)

## 2021-01-15 LAB — RESP PANEL BY RT-PCR (FLU A&B, COVID) ARPGX2
Influenza A by PCR: NEGATIVE
Influenza B by PCR: NEGATIVE
SARS Coronavirus 2 by RT PCR: NEGATIVE

## 2021-01-15 LAB — TROPONIN I (HIGH SENSITIVITY)
Troponin I (High Sensitivity): 87 ng/L — ABNORMAL HIGH (ref <18)
Troponin I (High Sensitivity): 77 ng/L — ABNORMAL HIGH (ref <18)

## 2021-01-15 LAB — TSH: TSH: 1.295 u[IU]/mL (ref 0.350–4.500)

## 2021-01-15 LAB — HEPATITIS B SURFACE ANTIGEN: Hepatitis B Surface Ag: NONREACTIVE

## 2021-01-15 LAB — HEPATITIS B SURFACE ANTIBODY,QUALITATIVE: Hep B S Ab: NONREACTIVE

## 2021-01-15 MED ORDER — HEPARIN SODIUM (PORCINE) 5000 UNIT/ML IJ SOLN
5000.0000 [IU] | Freq: Two times a day (BID) | INTRAMUSCULAR | Status: DC
  Administered 2021-01-15 – 2021-01-17 (×4): 5000 [IU] via SUBCUTANEOUS
  Filled 2021-01-15 (×4): qty 1

## 2021-01-15 MED ORDER — SEVELAMER CARBONATE 800 MG PO TABS: 800.0000 mg | ORAL_TABLET | Freq: Three times a day (TID) | ORAL | Status: DC

## 2021-01-15 MED ORDER — CHLORHEXIDINE GLUCONATE CLOTH 2 % EX PADS
6.0000 | MEDICATED_PAD | Freq: Every day | CUTANEOUS | Status: DC
  Administered 2021-01-16: 6 via TOPICAL

## 2021-01-15 MED ORDER — FUROSEMIDE 10 MG/ML IJ SOLN
40.0000 mg | Freq: Once | INTRAMUSCULAR | Status: AC
  Administered 2021-01-15: 40 mg via INTRAVENOUS
  Filled 2021-01-15: qty 4

## 2021-01-15 MED ORDER — IPRATROPIUM BROMIDE 0.02 % IN SOLN
0.5000 mg | Freq: Four times a day (QID) | RESPIRATORY_TRACT | Status: DC
  Administered 2021-01-15 – 2021-01-16 (×2): 0.5 mg via RESPIRATORY_TRACT
  Filled 2021-01-15 (×2): qty 2.5

## 2021-01-15 MED ORDER — DIPHENHYDRAMINE HCL 25 MG PO CAPS
25.0000 mg | ORAL_CAPSULE | Freq: Every evening | ORAL | Status: DC | PRN
  Administered 2021-01-15: 25 mg via ORAL
  Filled 2021-01-15 (×2): qty 1

## 2021-01-15 MED ORDER — ONDANSETRON HCL 4 MG/2ML IJ SOLN: 4.0000 mg | Freq: Four times a day (QID) | INTRAMUSCULAR | Status: DC | PRN

## 2021-01-15 MED ORDER — SODIUM CHLORIDE 0.9% FLUSH
3.0000 mL | Freq: Two times a day (BID) | INTRAVENOUS | Status: DC
  Administered 2021-01-15 – 2021-01-16 (×4): 3 mL via INTRAVENOUS

## 2021-01-15 MED ORDER — SEVELAMER CARBONATE 800 MG PO TABS: 800.0000 mg | ORAL_TABLET | ORAL | Status: DC | PRN

## 2021-01-15 MED ORDER — ACETAMINOPHEN 325 MG PO TABS: 650.0000 mg | ORAL_TABLET | ORAL | Status: DC | PRN

## 2021-01-15 MED ORDER — RENA-VITE PO TABS
1.0000 | ORAL_TABLET | Freq: Every day | ORAL | Status: DC
  Administered 2021-01-15 – 2021-01-16 (×2): 1 via ORAL
  Filled 2021-01-15 (×3): qty 1

## 2021-01-15 MED ORDER — CARVEDILOL 3.125 MG PO TABS
3.1250 mg | ORAL_TABLET | Freq: Two times a day (BID) | ORAL | Status: DC
  Administered 2021-01-15 – 2021-01-17 (×5): 3.125 mg via ORAL
  Filled 2021-01-15 (×6): qty 1

## 2021-01-15 MED ORDER — SEVELAMER CARBONATE 800 MG PO TABS
3600.0000 mg | ORAL_TABLET | Freq: Three times a day (TID) | ORAL | Status: DC
  Administered 2021-01-15 – 2021-01-17 (×4): 3600 mg via ORAL
  Filled 2021-01-15 (×2): qty 5
  Filled 2021-01-15: qty 4.5
  Filled 2021-01-15: qty 5

## 2021-01-15 MED ORDER — FUROSEMIDE 10 MG/ML IJ SOLN: 40.0000 mg | Freq: Every day | INTRAMUSCULAR | Status: DC

## 2021-01-15 MED ORDER — ADULT MULTIVITAMIN W/MINERALS CH: ORAL_TABLET | Freq: Every day | ORAL | Status: DC

## 2021-01-15 MED ORDER — ESCITALOPRAM OXALATE 10 MG PO TABS
10.0000 mg | ORAL_TABLET | Freq: Every day | ORAL | Status: DC
  Administered 2021-01-15 – 2021-01-17 (×2): 10 mg via ORAL
  Filled 2021-01-15 (×3): qty 1

## 2021-01-15 MED ORDER — LEVOTHYROXINE SODIUM 88 MCG PO TABS
88.0000 ug | ORAL_TABLET | Freq: Every day | ORAL | Status: DC
  Administered 2021-01-16 – 2021-01-17 (×2): 88 ug via ORAL
  Filled 2021-01-15 (×2): qty 1

## 2021-01-15 MED ORDER — SODIUM CHLORIDE 0.9% FLUSH: 3.0000 mL | INTRAVENOUS | Status: DC | PRN

## 2021-01-15 MED ORDER — ALBUTEROL SULFATE HFA 108 (90 BASE) MCG/ACT IN AERS: 2.0000 | INHALATION_SPRAY | Freq: Four times a day (QID) | RESPIRATORY_TRACT | Status: DC | PRN

## 2021-01-15 MED ORDER — SODIUM CHLORIDE 0.9 % IV SOLN: 250.0000 mL | INTRAVENOUS | Status: DC | PRN

## 2021-01-15 MED ORDER — HYDROCODONE-ACETAMINOPHEN 5-325 MG PO TABS
0.5000 | ORAL_TABLET | Freq: Four times a day (QID) | ORAL | Status: DC | PRN
  Administered 2021-01-17: 0.5 via ORAL
  Filled 2021-01-15: qty 1

## 2021-01-15 NOTE — Assessment & Plan Note (Signed)
Clinically this is chf until proven otherwise > to ER  (triage nurse aware)          Each maintenance medication was reviewed in detail including emphasizing most importantly the difference between maintenance and prns and under what circumstances the prns are to be triggered using an action plan format where appropriate.  Total time for H and P, chart review, counseling, and generating customized AVS unique to this office visit / same day charting for pt new to me  > 30 min

## 2021-01-15 NOTE — ED Notes (Signed)
While ambulating the pt became dizzy and O2 sats dropped to 91%.

## 2021-01-15 NOTE — Procedures (Signed)
I was present at this dialysis session, have reviewed the session itself and made  appropriate changes.  UFG 3L.  AVF Qb 400 non issues.  Tolerating well about 20 min into treatment. Jannifer Hick MD [facility] pager [phone]   01/15/2021, 2:59 PM

## 2021-01-15 NOTE — H&P (Addendum)
History and Physical    Jasmine White ZDG:[id number] DOB: [birth year] DOA: 01/15/2021  PCP: Leeroy Cha, MD (Confirm with patient/family/NH records and if not entered, this has to be entered at [facility] point of entry) Patient coming from: [facility]  I have personally briefly reviewed patient's old medical records in [place]  Chief Complaint: SOB  HPI: Jasmine White is a 75 y.o. female with medical history significant of ESRD on HD MWF, positive ANCA interstitial lung disease and diffuse alveolar hemorrhage, moderate mitral regurgitation, hypothyroidism, presented with increasing shortness of breath.  Symptoms of shortness of breath started about 1 month ago and gradually getting worse, mainly exertional dyspnea with some orthopnea symptoms.  No cough and no fever or chills.  Since yesterday, she has been experiencing more difficulty breathing, and has been feeling heart racing as well.  This morning she felt tightness in the chest and she went to see her pulmonologist for routine office visit who suspect the patient has new onset CHF and sent her to the ED. ED Course: Chest x-ray showed bilateral pleural effusion worsening to March.  Blood pressure elevated compared to baseline.  Tachypneic, tachycardia.  Review of Systems: As per HPI otherwise 14 point review of systems negative.    Past Medical History:  Diagnosis Date  . Arthritis   . Bruises easily   . Cancer ([place])    basal skin cancer  . Chronic back pain    HNP/stenosis and radiculopathy  . Hyperlipidemia    takes Pravastatin daily  . Hypertension    takes Hyzaar and toprol daily  . Hypothyroidism    takes Synthroid daily  . Insomnia    takes Elevil nightly  . Joint pain   . Joint swelling   . Low BP    past some sedation  . Nocturia   . PONV (postoperative nausea and vomiting)    with knee replacement b/p dropped  . Sinusitis    finished zpak yesterday  . Urinary frequency     Past Surgical History:   Procedure Laterality Date  . A/V FISTULAGRAM N/A 05/17/2020   Procedure: A/V FISTULAGRAM - Left Arm;  Surgeon: Marty Heck, MD;  Location: [facility];  Service: Cardiovascular;  Laterality: N/A;  . ABDOMINAL HYSTERECTOMY    . AV FISTULA PLACEMENT Left 04/02/2020   Procedure: ARTERIOVENOUS (AV) FISTULA CREATION;  Surgeon: Rosetta Posner, MD;  Location: [facility];  Service: Vascular;  Laterality: Left;  . BACK SURGERY    . basal cell skin cancer     on face and forehead  . bil knee replacements    . BREAST BIOPSY     left breast/benign  . COLONOSCOPY    . IR FLUORO GUIDE CV LINE RIGHT  03/20/2020  . IR US GUIDE VASC ACCESS RIGHT  03/20/2020  . LUMBAR LAMINECTOMY/DECOMPRESSION MICRODISCECTOMY  08/13/2012   Procedure: LUMBAR LAMINECTOMY/DECOMPRESSION MICRODISCECTOMY 1 LEVEL;  Surgeon: Erline Levine, MD;  Location: [facility];  Service: Neurosurgery;  Laterality: Left;  Left lumbar one-two Microdiskectomy  . PERIPHERAL VASCULAR BALLOON ANGIOPLASTY Left 05/17/2020   Procedure: PERIPHERAL VASCULAR BALLOON ANGIOPLASTY;  Surgeon: Marty Heck, MD;  Location: [facility];  Service: Cardiovascular;  Laterality: Left;  Arm fistula      reports that she has never smoked. She has never used smokeless tobacco. She reports that she does not drink alcohol and does not use drugs.  Allergies  Allergen Reactions  . Hctz [Hydrochlorothiazide] Other (See Comments)  Hx of severe Hyponatremia while taking HCTZ - now contraindicated  . Percocet [Oxycodone-Acetaminophen] Nausea Only  . Ultram [Tramadol] Nausea Only and Other (See Comments)    Patient remarked this made her "feel crazy"    Family History  Problem Relation Age of Onset  . Hypertension Mother   . Heart disease Mother   . Stroke Father   . Heart disease Father   . Alcohol abuse Father   . Arthritis Brother   . Cancer Brother 66       prostate  . Arthritis Brother   . Colon cancer Neg Hx   . Colon polyps Neg  Hx   . Stomach cancer Neg Hx   . Rectal cancer Neg Hx       Prior to Admission medications   Medication Sig Start Date End Date Taking? Authorizing Provider  VITAMIN D PO Take 1 tablet by mouth daily. 04/18/20 04/17/21 Yes [provider]  albuterol (VENTOLIN HFA) 108 (90 Base) MCG/ACT inhaler Inhale 2 puffs into the lungs every 6 (six) hours as needed for wheezing or shortness of breath. 12/19/20   Chesley Mires, MD  b complex-vitamin c-folic acid (NEPHRO-VITE) 0.8 MG TABS tablet Take 1 tablet by mouth at bedtime.    [provider]  escitalopram (LEXAPRO) 10 MG tablet Take 10 mg by mouth daily.    [provider]  HYDROcodone-acetaminophen (NORCO/VICODIN) 5-325 MG tablet Take 0.5 tablets by mouth every 6 (six) hours as needed for moderate pain.    [provider]  Multiple Vitamin (MULTI-VITAMIN DAILY PO) SMARTSIG:1 Tablet(s) By Mouth Daily 05/03/20   [provider]  sevelamer carbonate (RENVELA) 800 MG tablet Take 800 mg by mouth. Taking 1 tablet four times a day    [provider]  SYNTHROID 88 MCG tablet TAKE 1 TABLET BY MOUTH ONCE DAILY BEFORE BREAKFAST Patient taking differently: Take 88 mcg by mouth daily before breakfast. 03/01/20   Chevis Pretty, FNP    Physical Exam: Vitals:   01/15/21 1015 01/15/21 1017 01/15/21 1122 01/15/21 1215  BP:  (!) 142/91 (!) 145/89 (!) 155/94  Pulse:  (!) 105 (!) 106 (!) 105  Resp:  (!) 27 (!) 33 (!) 21  Temp:  98 F (36.7 C)    TempSrc:  Oral    SpO2:  96% 100% 100%  Weight: 52.9 kg     Height: 5\' 6"  (1.676 m)       Constitutional: NAD, calm, comfortable Vitals:   01/15/21 1015 01/15/21 1017 01/15/21 1122 01/15/21 1215  BP:  (!) 142/91 (!) 145/89 (!) 155/94  Pulse:  (!) 105 (!) 106 (!) 105  Resp:  (!) 27 (!) 33 (!) 21  Temp:  98 F (36.7 C)    TempSrc:  Oral    SpO2:  96% 100% 100%  Weight: 52.9 kg     Height: 5\' 6"  (1.676 m)      Eyes: PERRL, lids and conjunctivae  normal ENMT: Mucous membranes are moist. Posterior pharynx clear of any exudate or lesions.Normal dentition.  Neck: normal, supple, no masses, no thyromegaly. JVD about 6 cm above clavicle Respiratory: clear to auscultation bilaterally, no wheezing, fine crackles to bilateral mid levels.  Increasing respiratory effort.  Talking in broken sentences.  No accessory muscle use.  Cardiovascular: Regular rate and rhythm, systolic murmur on apex. No extremity edema. 2+ pedal pulses. No carotid bruits.  Abdomen: no tenderness, no masses palpated. No hepatosplenomegaly. Bowel sounds positive.  Musculoskeletal: no clubbing / cyanosis. No joint  deformity upper and lower extremities. Good ROM, no contractures. Normal muscle tone.  Skin: no rashes, lesions, ulcers. No induration Neurologic: CN 2-12 grossly intact. Sensation intact, DTR normal. Strength 5/5 in all 4.  Psychiatric: Normal judgment and insight. Alert and oriented x 3. Normal mood.     Labs on Admission: I have personally reviewed following labs and imaging studies  CBC: Recent Labs  Lab 01/15/21 1054  WBC 5.7  NEUTROABS 3.8  HGB 11.7*  HCT 37.3  MCV 95.6  PLT 124   Basic Metabolic Panel: Recent Labs  Lab 01/15/21 1054  NA 132*  K 3.6  CL 88*  CO2 32  GLUCOSE 95  BUN 46*  CREATININE 5.43*  CALCIUM 10.0   GFR: Estimated Creatinine Clearance: 7.5 mL/min (A) (by C-G formula based on SCr of 5.43 mg/dL (H)). Liver Function Tests: Recent Labs  Lab 01/15/21 1054  AST 51*  ALT 25  ALKPHOS 138*  BILITOT 1.1  PROT 6.7  ALBUMIN 3.8   No results for input(s): LIPASE, AMYLASE in the last 168 hours. No results for input(s): AMMONIA in the last 168 hours. Coagulation Profile: No results for input(s): INR, PROTIME in the last 168 hours. Cardiac Enzymes: No results for input(s): CKTOTAL, CKMB, CKMBINDEX, TROPONINI in the last 168 hours. BNP (last 3 results) Recent Labs    12/12/20 1509  PROBNP 3,681.0*   HbA1C: No  results for input(s): HGBA1C in the last 72 hours. CBG: No results for input(s): GLUCAP in the last 168 hours. Lipid Profile: No results for input(s): CHOL, HDL, LDLCALC, TRIG, CHOLHDL, LDLDIRECT in the last 72 hours. Thyroid Function Tests: No results for input(s): TSH, T4TOTAL, FREET4, T3FREE, THYROIDAB in the last 72 hours. Anemia Panel: No results for input(s): VITAMINB12, FOLATE, FERRITIN, TIBC, IRON, RETICCTPCT in the last 72 hours. Urine analysis:    Component Value Date/Time   COLORURINE AMBER (A) 03/18/2020 2143   APPEARANCEUR HAZY (A) 03/18/2020 2143   APPEARANCEUR Clear 03/03/2019 0920   LABSPEC 1.008 03/18/2020 2143   PHURINE 6.0 03/18/2020 2143   GLUCOSEU NEGATIVE 03/18/2020 2143   HGBUR LARGE (A) 03/18/2020 2143   BILIRUBINUR NEGATIVE 03/18/2020 2143   BILIRUBINUR Negative 03/03/2019 0920   KETONESUR NEGATIVE 03/18/2020 2143   PROTEINUR 100 (A) 03/18/2020 2143   UROBILINOGEN negative 12/21/2012 1509   NITRITE NEGATIVE 03/18/2020 2143   LEUKOCYTESUR SMALL (A) 03/18/2020 2143    Radiological Exams on Admission: DG Chest 2 View  Result Date: 01/15/2021 CLINICAL DATA:  Shortness of breath. EXAM: CHEST - 2 VIEW COMPARISON:  CT 12/17/2020.  Chest x-ray 12/12/2020, 04/18/2020. FINDINGS: Mediastinum and hilar structures normal. Stable cardiomegaly. Diffuse bilateral interstitial prominence again noted. Similar findings noted on prior exams. Reference is made to CT report of 12/17/2020. Small bilateral pleural effusions noted. No pneumothorax. IMPRESSION: 1.  Stable cardiomegaly. 2. Diffuse bilateral interstitial prominence again noted. Similar findings noted on prior exam. Reference is made to prior CT report of 12/17/2020. Small bilateral pleural effusions again noted. Electronically Signed   By: Marcello Moores  Register   On: 01/15/2021 11:20    EKG: Independently reviewed.  Sinus tachycardia  Assessment/Plan Active Problems:   Acute CHF (congestive heart failure) (HCC)   CHF  (congestive heart failure) ([place])  (please populate well all problems here in Problem List. (For example, if patient is on BP meds at home and you resume or decide to hold them, it is a problem that needs to be her. Same for CAD, COPD, HLD and so on)  New  onset CHF, suspect diastolic -In combination with moderate mitral regurgitation.  Patient only makes minimal urine, will trend what dose of Lasix, patient allergic to morphine.  Consider BiPAP if remained respiratory distress.  Discussed with on-call nephrologist about additional HD sessions to relieve symptoms. -Echocardiogram -Start Coreg  Impending respiratory failure -As above  Elevated troponins -Likely demand ischemia in the setting of decompensated CHF and CKD -Follow-up second set and check echocardiogram.  Elevated blood pressure without diagnosis of HTN -Husband who collects BP record from various occasions showed almost always normalized BP. -Trial of small dose of Coreg to treat both elevation BP and Tachy  Tachycardia -Likely from CHF -Check TSH -Change Albuterol to Atrovent\  Hx of mitral regurgitation -Fluid overload, extra HD  ESRD on HD -Nephro consulted   Pulm fibrosis -Outpatient pulm F/U, last year's Echo showed no pulm HTN.  DVT prophylaxis: Heparin subQ Code Status: Full Code Family Communication: Husband at bedside Disposition Plan: Expect 2-3 days hospital stay to treat fluid overload. Consults called: Nephro Admission status: Tele admit   Lequita Halt MD [facility] Pager [phone]  01/15/2021, 1:18 PM

## 2021-01-15 NOTE — Patient Instructions (Signed)
Please go to the  and I will call the triage nurse to let her know you're on the way

## 2021-01-15 NOTE — Progress Notes (Signed)
MD returned call. See new orders.

## 2021-01-15 NOTE — Progress Notes (Signed)
Dr Juanetta Gosling last A/p  12/19/20     Assessment/Plan:   Interstitial lung disease with Diffuse Alveolar Hemorrhage in July 2021. - in setting of ANCA, GBM positive rapidly progressive glomerulonephritis - no evidence for recurrence on most recent CT chest  Dyspnea. - had episode of shortness of breath earlier in March 2022, likely from pleural effusion and atelectasis - clinically improved - advised her to use incentive spirometer and avoid using devices that would restrict her lung expansion - discussed indications for supplemental oxygen, and explained that being short of breath doesn't always correlate with needing extra oxygen  ESRD in setting of Rapidly Progressive Glomerulonephritis. - followed by Dr. Marval Regal with [facility] - treated with rituximab in Summer of 2021 - she is being assessed at [facility] for renal transplant and will have pulmonary assessment there in April 2022 to assess fitness for transplant               01/15/2021  Acute  ov/[facility]auvin re: sob worse x 2 weeks s desats  - no immunosuppressive rx  Chief Complaint  Patient presents with  . Acute Visit    Increased sob x 2 wks.,no wheezing or cough. Started oxygen 2L pulsing 4 days ago.  Dyspnea: was able to walk to end drive  Of driveway x 761  And now only walk 100 ft  Cough: none Sleeping: could sleep flat bed one pillow now in chair  SABA use: not much, last used one day prior to OV   02: started back on 2lpm POC 4 d prior to [place]  / checked sats sitting always above 90 even off 02  Covid status:   vax x 3 moderna   No obvious day to day or daytime variability or assoc excess/ purulent sputum or mucus plugs or hemoptysis or cp or chest tightness, subjective wheeze or overt sinus or hb symptoms.    Also denies any obvious fluctuation of symptoms with weather or environmental changes or other aggravating or alleviating factors except as outlined above   No unusual exposure hx or h/o  childhood pna/ asthma or knowledge of premature birth.  Current Allergies, Complete Past Medical History, Past Surgical History, Family History, and Social History were reviewed in [employer] record.  ROS  The following are not active complaints unless bolded Hoarseness, sore throat, dysphagia, dental problems, itching, sneezing,  nasal congestion or discharge of excess mucus or purulent secretions, ear ache,   fever, chills, sweats, unintended wt loss or wt gain, classically pleuritic or exertional cp,  orthopnea pnd or arm/hand swelling  or leg swelling, presyncope, palpitations, abdominal pain, anorexia, nausea, vomiting, diarrhea  or change in bowel habits or change in bladder habits, change in stools or change in urine, dysuria, hematuria,  rash, arthralgias, visual complaints, headache, numbness, weakness or ataxia or problems with walking or coordination,  change in mood or  memory.        Current Meds  Medication Sig  . albuterol (VENTOLIN HFA) 108 (90 Base) MCG/ACT inhaler Inhale 2 puffs into the lungs every 6 (six) hours as needed for wheezing or shortness of breath.  Marland Kitchen b complex-vitamin c-folic acid (NEPHRO-VITE) 0.8 MG TABS tablet Take 1 tablet by mouth at bedtime.  Marland Kitchen escitalopram (LEXAPRO) 10 MG tablet Take 10 mg by mouth daily.  Marland Kitchen HYDROcodone-acetaminophen (NORCO/VICODIN) 5-325 MG tablet Take 0.5 tablets by mouth every 6 (six) hours as needed for moderate pain.  . Multiple Vitamin (MULTI-VITAMIN DAILY PO) SMARTSIG:1 Tablet(s)  By Mouth Daily  . sevelamer carbonate (RENVELA) 800 MG tablet Take 800 mg by mouth. Taking 1 tablet four times a day  . SYNTHROID 88 MCG tablet TAKE 1 TABLET BY MOUTH ONCE DAILY BEFORE BREAKFAST (Patient taking differently: Take 88 mcg by mouth daily before breakfast.)        PEx  Wt Readings from Last 3 Encounters:  01/15/21 115 lb 3.2 oz (52.3 kg)  12/19/20 117 lb 6.4 oz (53.3 kg)  12/12/20 120 lb (54.4 kg)      Vital signs  reviewed  01/15/2021  - Note at rest 02 sats  96% on RA   General appearance:    amb anxious wf, sob at rest   HEENT : pt wearing mask not removed for exam due to covid -19 concerns.    NECK :  without JVD/Nodes/TM/ nl carotid upstrokes bilaterally   LUNGS: no acc muscle use,  Nl contour chest with insp crackles bases  bilaterally without cough on insp or exp maneuvers   CV:  RRR s3 gallop s  increase in P2, and  Trace bilateral ankle/foot edema   ABD:  soft and nontender with nl inspiratory excursion in the supine position. No bruits or organomegaly appreciated, bowel sounds nl  MS:  Nl gait/ ext warm without deformities, calf tenderness, cyanosis or clubbing No obvious joint restrictions   SKIN: warm and dry without lesions    NEURO:  alert, approp, nl sensorium with  no motor or cerebellar deficits apparent.

## 2021-01-15 NOTE — ED Notes (Signed)
Patient transported to X-ray 

## 2021-01-15 NOTE — ED Provider Notes (Signed)
[facility] Provider Note   CSN: [id number] Arrival date & time: 01/15/21  1008     History Chief Complaint  Patient presents with  . Shortness of Breath  . Chest Pain    Jasmine White is a 75 y.o. female presenting for evaluation of shortness of breath and chest pain.  Patient states for the past 1 to 2 weeks, she has had gradually worsening shortness of breath.  It is significantly worse with exertion and when she lays flat, however is also present at rest.  She also reports associated chest pain which is intermittent and described as a heaviness.  She saw her pulmonologist today, who thought her symptoms may be more related to a heart problem than a lung problem.  She denies any previous history of heart problems, has never seen a cardiologist.  Patient was diagnosed with COVID several months ago, had subsequent kidney failure and is currently on dialysis, goes Monday, Wednesday, Friday.  Last dialysis was yesterday, was a normal session for her.  She does report worsening leg swelling over the past several weeks to months, not improved with dialysis.  She has no history of heart failure, is not on diuretics.  She denies recent fevers, chills, cough, nausea, vomiting, abnormal bowel movements.  She does continue to produce minimal amounts of urine, this has been normal for her.   Additional history obtained from chart review.  Patient with a history of GBM positive rapidly progressive glomerulonephritis, hypertension, hyperlipidemia, hypothyroidism.  I reviewed recent pulmonology note from today.  Patient has been on oxygen for the past week due to worsening shortness of breath.  Sent here due to abnormal heart sounds which are new.  HPI     Past Medical History:  Diagnosis Date  . Arthritis   . Bruises easily   . Cancer ([place])    basal skin cancer  . Chronic back pain    HNP/stenosis and radiculopathy  . Hyperlipidemia    takes  Pravastatin daily  . Hypertension    takes Hyzaar and toprol daily  . Hypothyroidism    takes Synthroid daily  . Insomnia    takes Elevil nightly  . Joint pain   . Joint swelling   . Low BP    past some sedation  . Nocturia   . PONV (postoperative nausea and vomiting)    with knee replacement b/p dropped  . Sinusitis    finished zpak yesterday  . Urinary frequency     Patient Active Problem List   Diagnosis Date Noted  . 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 HAP (hospital-acquired pneumonia)   . Acute pulmonary edema ([facility])   . Hypokalemia 04/16/2020  . Unspecified protein-calorie malnutrition ([place]) 04/16/2020  . Allergy, unspecified, initial encounter 04/11/2020  . Anaphylactic shock, unspecified, initial encounter 04/11/2020  . Anemia, unspecified 04/03/2020  . Coagulation defect, unspecified ([place]) 04/03/2020  . Diarrhea, unspecified 04/03/2020  . Dyspnea, unspecified 04/03/2020  . Encounter for immunization 04/03/2020  . Iron deficiency anemia, unspecified 04/03/2020  . Nocturia 04/03/2020  . Other specified arthritis, multiple sites 04/03/2020  . Pruritus, unspecified 04/03/2020  . Secondary hyperparathyroidism of renal origin ([place]) 04/03/2020  . Renal failure   . Intractable vomiting   . Hyponatremia 03/17/2020  . Primary insomnia 01/20/2019  . History of total knee replacement, bilateral 01/01/2018  . BMI 27.0-27.9,adult 06/29/2015  . Chronic back pain 07/06/2014  . Essential hypertension, benign 12/21/2012  . Hyperlipidemia with target LDL less than  130 12/21/2012  . Hypothyroidism 12/21/2012  . Depression 12/21/2012    Past Surgical History:  Procedure Laterality Date  . A/V FISTULAGRAM N/A 05/17/2020   Procedure: A/V FISTULAGRAM - Left Arm;  Surgeon: Marty Heck, MD;  Location: [facility];  Service: Cardiovascular;  Laterality: N/A;  . ABDOMINAL HYSTERECTOMY    . AV FISTULA PLACEMENT Left 04/02/2020   Procedure: ARTERIOVENOUS (AV) FISTULA  CREATION;  Surgeon: Rosetta Posner, MD;  Location: [facility];  Service: Vascular;  Laterality: Left;  . BACK SURGERY    . basal cell skin cancer     on face and forehead  . bil knee replacements    . BREAST BIOPSY     left breast/benign  . COLONOSCOPY    . IR FLUORO GUIDE CV LINE RIGHT  03/20/2020  . IR US GUIDE VASC ACCESS RIGHT  03/20/2020  . LUMBAR LAMINECTOMY/DECOMPRESSION MICRODISCECTOMY  08/13/2012   Procedure: LUMBAR LAMINECTOMY/DECOMPRESSION MICRODISCECTOMY 1 LEVEL;  Surgeon: Erline Levine, MD;  Location: [facility];  Service: Neurosurgery;  Laterality: Left;  Left lumbar one-two Microdiskectomy  . PERIPHERAL VASCULAR BALLOON ANGIOPLASTY Left 05/17/2020   Procedure: PERIPHERAL VASCULAR BALLOON ANGIOPLASTY;  Surgeon: Marty Heck, MD;  Location: [facility];  Service: Cardiovascular;  Laterality: Left;  Arm fistula      OB History   No obstetric history on file.     Family History  Problem Relation Age of Onset  . Hypertension Mother   . Heart disease Mother   . Stroke Father   . Heart disease Father   . Alcohol abuse Father   . Arthritis Brother   . Cancer Brother 10       prostate  . Arthritis Brother   . Colon cancer Neg Hx   . Colon polyps Neg Hx   . Stomach cancer Neg Hx   . Rectal cancer Neg Hx     Social History   Tobacco Use  . Smoking status: Never Smoker  . Smokeless tobacco: Never Used  Vaping Use  . Vaping Use: Never used  Substance Use Topics  . Alcohol use: No    Alcohol/week: 0.0 standard drinks  . Drug use: No    Home Medications Prior to Admission medications   Medication Sig Start Date End Date Taking? Authorizing Provider  albuterol (VENTOLIN HFA) 108 (90 Base) MCG/ACT inhaler Inhale 2 puffs into the lungs every 6 (six) hours as needed for wheezing or shortness of breath. 12/19/20   Chesley Mires, MD  b complex-vitamin c-folic acid (NEPHRO-VITE) 0.8 MG TABS tablet Take 1 tablet by mouth at bedtime.    [provider]   escitalopram (LEXAPRO) 10 MG tablet Take 10 mg by mouth daily.    [provider]  HYDROcodone-acetaminophen (NORCO/VICODIN) 5-325 MG tablet Take 0.5 tablets by mouth every 6 (six) hours as needed for moderate pain.    [provider]  Multiple Vitamin (MULTI-VITAMIN DAILY PO) SMARTSIG:1 Tablet(s) By Mouth Daily 05/03/20   [provider]  sevelamer carbonate (RENVELA) 800 MG tablet Take 800 mg by mouth. Taking 1 tablet four times a day    [provider]  SYNTHROID 88 MCG tablet TAKE 1 TABLET BY MOUTH ONCE DAILY BEFORE BREAKFAST Patient taking differently: Take 88 mcg by mouth daily before breakfast. 03/01/20   Hassell Done, [name], FNP  VITAMIN D PO Take 1 tablet by mouth daily.  Patient not taking: Reported on 01/15/2021 04/18/20 04/17/21  [provider]    Allergies  Hctz [hydrochlorothiazide], Percocet [oxycodone-acetaminophen], and Ultram [tramadol]  Review of Systems   Review of Systems  Respiratory: Positive for shortness of breath.   Cardiovascular: Positive for chest pain and leg swelling.  All other systems reviewed and are negative.   Physical Exam Updated Vital Signs BP (!) 155/94   Pulse (!) 105   Temp 98 F (36.7 C) (Oral)   Resp (!) 21   Ht 5\' 6"  (1.676 m)   Wt 52.9 kg   SpO2 100%   BMI 18.82 kg/m   Physical Exam Vitals and nursing note reviewed.  Constitutional:      General: She is not in acute distress.    Appearance: She is well-developed.     Comments: Appears chronically ill  HENT:     Head: Normocephalic and atraumatic.  Eyes:     Extraocular Movements: Extraocular movements intact.     Conjunctiva/sclera: Conjunctivae normal.     Pupils: Pupils are equal, round, and reactive to light.  Cardiovascular:     Rate and Rhythm: Normal rate and regular rhythm.     Pulses: Normal pulses.     Heart sounds: Murmur heard.      Comments: Mildly tachycardic around 105. Pulmonary:     Effort: Pulmonary effort  is normal. No respiratory distress.     Breath sounds: Examination of the right-lower field reveals rales. Examination of the left-lower field reveals rales. Rales present. No wheezing.     Comments: Speaking in full sentences.  Mildly tachypneic.  On 2 L via [place], sats in the mid 90s.  Crackles in bilateral lower bases Abdominal:     General: Bowel sounds are normal. There is no distension.     Palpations: Abdomen is soft.     Tenderness: There is no abdominal tenderness.  Musculoskeletal:        General: Normal range of motion.     Cervical back: Normal range of motion and neck supple.     Right lower leg: No edema.     Left lower leg: No edema.     Comments: No pitting edema  Skin:    General: Skin is warm and dry.     Capillary Refill: Capillary refill takes less than 2 seconds.  Neurological:     Mental Status: She is alert and oriented to person, place, and time.     ED Results / Procedures / Treatments   Labs (all labs ordered are listed, but only abnormal results are displayed) Labs Reviewed  CBC WITH DIFFERENTIAL/PLATELET - Abnormal; Notable for the following components:      Result Value   Hemoglobin 11.7 (*)    RDW 17.1 (*)    All other components within normal limits  COMPREHENSIVE METABOLIC PANEL - Abnormal; Notable for the following components:   Sodium 132 (*)    Chloride 88 (*)    BUN 46 (*)    Creatinine, Ser 5.43 (*)    AST 51 (*)    Alkaline Phosphatase 138 (*)    GFR, Estimated 8 (*)    All other components within normal limits  TROPONIN I (HIGH SENSITIVITY) - Abnormal; Notable for the following components:   Troponin I (High Sensitivity) 87 (*)    All other components within normal limits  RESP PANEL BY RT-PCR (FLU A&B, COVID) [id number]  TROPONIN I (HIGH SENSITIVITY)    EKG EKG Interpretation  Date/Time:  Tuesday January 15 2021 10:19:14 EDT Ventricular Rate:  105 PR Interval:  169 QRS Duration: 104 QT  Interval:  366 QTC Calculation: 484 R  Axis:   -8 Text Interpretation: Sinus tachycardia Probable left atrial enlargement Low voltage, extremity leads Abnormal T, probable ischemia, inferior leads Borderline ST elevation, anterior leads No acute changes Nonspecific ST and T wave abnormality Confirmed by Varney Biles [phone]) on 01/15/2021 11:33:46 AM   Radiology DG Chest 2 View  Result Date: 01/15/2021 CLINICAL DATA:  Shortness of breath. EXAM: CHEST - 2 VIEW COMPARISON:  CT 12/17/2020.  Chest x-ray 12/12/2020, 04/18/2020. FINDINGS: Mediastinum and hilar structures normal. Stable cardiomegaly. Diffuse bilateral interstitial prominence again noted. Similar findings noted on prior exams. Reference is made to CT report of 12/17/2020. Small bilateral pleural effusions noted. No pneumothorax. IMPRESSION: 1.  Stable cardiomegaly. 2. Diffuse bilateral interstitial prominence again noted. Similar findings noted on prior exam. Reference is made to prior CT report of 12/17/2020. Small bilateral pleural effusions again noted. Electronically Signed   By: Marcello Moores  Register   On: 01/15/2021 11:20    Procedures Procedures   Medications Ordered in ED Medications - No data to display  ED Course  I have reviewed the triage vital signs and the nursing notes.  Pertinent labs & imaging results that were available during my care of the patient were reviewed by me and considered in my medical decision making (see chart for details).    MDM Rules/Calculators/A&P                          Patient presented for evaluation of worsening shortness of breath and chest pain.  On exam, patient appears chronically ill.  She is tachypneic and tachycardic.  On oxygen, sats are reassuring, however patient does not normally need oxygen.  She has crackles in bilateral lower bases.  Concern for heart failure.  Consider infection including viral or bacterial cause.  Consider PE, although based on exam, patient more likely to be fluid overloaded than to have PE.   Consider ACS.  Will obtain labs, chest x-ray, and reassess.  Chest x-ray viewed and independently interpreted by myself, shows worsening cardiomegaly and edema.  This fits the patient's picture of being fluid overloaded.  Labs show mild elevation in troponin, likely due to demand.  Patient ambulated, became dizzy with ambulation and sats dropped to 91% on oxygen.  In the setting of what appears to be new onset heart failure, patient will likely need to be admitted for further evaluation.  Discussed with Dr. Roosevelt Locks from [facility] hospitalist service, patient to be admitted  Final Clinical Impression(s) / ED Diagnoses Final diagnoses:  Cardiomegaly  Shortness of breath  Elevated troponin    Rx / DC Orders ED Discharge Orders    None       Franchot Heidelberg, PA-C 01/15/21 [place], [place], MD 01/16/21 [phone]

## 2021-01-15 NOTE — ED Triage Notes (Signed)
Pt coming from dr office. Complaint of shortness of breath and chest tightness. Pt does receive dialysis, had entire treatment yesterday but still not feeling better.

## 2021-01-15 NOTE — Progress Notes (Signed)
Paged MD related to pt verbalized needs something to help her sleep tonight. Awaiting call back.

## 2021-01-15 NOTE — Consult Note (Signed)
[facility] Renal Consultation Note    Indication for Consultation:  Management of ESRD/hemodialysis, anemia, hypertension/volume, and secondary hyperparathyroidism.  HPI: Jasmine White is a 75 y.o. female with PMH including ESRD on dialysis MWF, HTN, hypothyroidism, positive ANCA interstitial lung disease and diffuse alveolar hemorrhage, moderate mitral regurgitation, who was sent to the ED by her pulmonologist for worsening SOB and suspected CHF. Patient reports she has been progressively more SOB over the past month. Reports she started using home O2 this week. Shortness of breath is worse with exertion and laying flat, and she reports her doctor noticed swelling in her legs. She also feels some chest tightness when trying to catch her breath. Reports dizziness with ambulation. Denies heart palpitations, abdominal pain, nausea, vomiting and diarrhea. No recent fever/chills. She does endorse very poor appetite lately and suspects she is losing weight. CXR in the ED notable for worsening bilateral pleural effusion. VS notable for HTN, tachycardia and tachypneic. She was admitted for management of suspected CHF, echocardiogram ordered and nephrology consulted for dialysis. Outpatient HD records show minimal fluid gains. EDW recently lowered to 52kg and she left at 51.5kg last dialysis.   Past Medical History:  Diagnosis Date  . Arthritis   . Bruises easily   . Cancer ([place])    basal skin cancer  . Chronic back pain    HNP/stenosis and radiculopathy  . Hyperlipidemia    takes Pravastatin daily  . Hypertension    takes Hyzaar and toprol daily  . Hypothyroidism    takes Synthroid daily  . Insomnia    takes Elevil nightly  . Joint pain   . Joint swelling   . Low BP    past some sedation  . Nocturia   . PONV (postoperative nausea and vomiting)    with knee replacement b/p dropped  . Sinusitis    finished zpak yesterday  . Urinary frequency    Past Surgical History:   Procedure Laterality Date  . A/V FISTULAGRAM N/A 05/17/2020   Procedure: A/V FISTULAGRAM - Left Arm;  Surgeon: Marty Heck, MD;  Location: [facility];  Service: Cardiovascular;  Laterality: N/A;  . ABDOMINAL HYSTERECTOMY    . AV FISTULA PLACEMENT Left 04/02/2020   Procedure: ARTERIOVENOUS (AV) FISTULA CREATION;  Surgeon: Rosetta Posner, MD;  Location: [facility];  Service: Vascular;  Laterality: Left;  . BACK SURGERY    . basal cell skin cancer     on face and forehead  . bil knee replacements    . BREAST BIOPSY     left breast/benign  . COLONOSCOPY    . IR FLUORO GUIDE CV LINE RIGHT  03/20/2020  . IR US GUIDE VASC ACCESS RIGHT  03/20/2020  . LUMBAR LAMINECTOMY/DECOMPRESSION MICRODISCECTOMY  08/13/2012   Procedure: LUMBAR LAMINECTOMY/DECOMPRESSION MICRODISCECTOMY 1 LEVEL;  Surgeon: Erline Levine, MD;  Location: [facility];  Service: Neurosurgery;  Laterality: Left;  Left lumbar one-two Microdiskectomy  . PERIPHERAL VASCULAR BALLOON ANGIOPLASTY Left 05/17/2020   Procedure: PERIPHERAL VASCULAR BALLOON ANGIOPLASTY;  Surgeon: Marty Heck, MD;  Location: [facility];  Service: Cardiovascular;  Laterality: Left;  Arm fistula    Family History  Problem Relation Age of Onset  . Hypertension Mother   . Heart disease Mother   . Stroke Father   . Heart disease Father   . Alcohol abuse Father   . Arthritis Brother   . Cancer Brother 56       prostate  . Arthritis Brother   .  Colon cancer Neg Hx   . Colon polyps Neg Hx   . Stomach cancer Neg Hx   . Rectal cancer Neg Hx    Social History:  reports that she has never smoked. She has never used smokeless tobacco. She reports that she does not drink alcohol and does not use drugs.  ROS: As per HPI otherwise negative.  Physical Exam: Vitals:   01/15/21 1015 01/15/21 1017 01/15/21 1122 01/15/21 1215  BP:  (!) 142/91 (!) 145/89 (!) 155/94  Pulse:  (!) 105 (!) 106 (!) 105  Resp:  (!) 27 (!) 33 (!) 21  Temp:  98 F  (36.7 C)    TempSrc:  Oral    SpO2:  96% 100% 100%  Weight: 52.9 kg     Height: 5\' 6"  (1.676 m)        General: Well developed, well nourished, speaking full sentences but tachypneic Head: Normocephalic, atraumatic, sclera non-icteric, mucus membranes are moist. Neck: +JVD Lungs: Tachypneic on O2 via [place], diffuse crackles bilateral lower/mid lung fields. No wheezing auscultated  Heart: 3/6 systolic murmur, mildly tachycardic rhythm, regular rate Abdomen: Soft, non-tender, non-distended with normoactive bowel sounds. No rebound/guarding. No obvious abdominal masses. Musculoskeletal:  Strength and tone appear normal for age. Lower extremities: trace pedal edema bilaterally Neuro: Alert and oriented X 3. Moves all extremities spontaneously. Psych:  Responds to questions appropriately with a normal affect. Dialysis Access: LUE AVF + Bruit  Allergies  Allergen Reactions  . Hctz [Hydrochlorothiazide] Other (See Comments)    Hx of severe Hyponatremia while taking HCTZ - now contraindicated  . Percocet [Oxycodone-Acetaminophen] Nausea Only  . Ultram [Tramadol] Nausea Only and Other (See Comments)    Patient remarked this made her "feel crazy"   Prior to Admission medications   Medication Sig Start Date End Date Taking? Authorizing Provider  acetaminophen (TYLENOL) 500 MG tablet Take 500-1,000 mg by mouth daily as needed (for pain).   Yes [provider]  albuterol (VENTOLIN HFA) 108 (90 Base) MCG/ACT inhaler Inhale 2 puffs into the lungs every 6 (six) hours as needed for wheezing or shortness of breath. 12/19/20  Yes Chesley Mires, MD  b complex-vitamin c-folic acid (NEPHRO-VITE) 0.8 MG TABS tablet Take 1 tablet by mouth at bedtime.   Yes [provider]  escitalopram (LEXAPRO) 10 MG tablet Take 10 mg by mouth daily in the afternoon.   Yes [provider]  HYDROcodone-acetaminophen (NORCO/VICODIN) 5-325 MG tablet Take 0.5 tablets by mouth at bedtime as needed (for  pain).   Yes [provider]  hydrocortisone 1 % lotion Apply 1 application topically 2 (two) times daily as needed for itching.   Yes [provider]  sevelamer carbonate (RENVELA) 800 MG tablet Take 800-3,600 mg by mouth See admin instructions. Take 3,600 mg by mouth with each meal and 800 mg with each snack   Yes [provider]  SYNTHROID 88 MCG tablet TAKE 1 TABLET BY [place] BREAKFAST Patient taking differently: Take 88 mcg by mouth daily before breakfast. 03/01/20  Yes Chevis Pretty, FNP   Current Facility-Administered Medications  Medication Dose Route Frequency Provider Last Rate Last Admin  . 0.9 %  sodium chloride infusion  250 mL Intravenous PRN Wynetta Fines T, MD      . acetaminophen (TYLENOL) tablet 650 mg  650 mg Oral Q4H PRN Wynetta Fines T, MD      . albuterol (VENTOLIN HFA) 108 (90 Base) MCG/ACT inhaler 2 puff  2 puff Inhalation  Q6H PRN Wynetta Fines T, MD      . carvedilol (COREG) tablet 3.125 mg  3.125 mg Oral BID WC Wynetta Fines T, MD      . escitalopram (LEXAPRO) tablet 10 mg  10 mg Oral Daily Wynetta Fines T, MD      . furosemide (LASIX) injection 40 mg  40 mg Intravenous Once Wynetta Fines T, MD      . heparin injection 5,000 Units  5,000 Units Subcutaneous Q12H Wynetta Fines T, MD      . HYDROcodone-acetaminophen (NORCO/VICODIN) 5-325 MG per tablet 0.5 tablet  0.5 tablet Oral Q6H PRN Wynetta Fines T, MD      . ipratropium (ATROVENT) nebulizer solution 0.5 mg  0.5 mg Nebulization Q6H Lequita Halt, MD      . Derrill Memo ON 01/16/2021] levothyroxine (SYNTHROID) tablet 88 mcg  88 mcg Oral QAC breakfast Wynetta Fines T, MD      . Multi-Vitamin Daily TABS   Oral Daily Wynetta Fines T, MD      . multivitamin (RENA-VIT) tablet 1 tablet  1 tablet Oral QHS Wynetta Fines T, MD      . ondansetron [facility]) injection 4 mg  4 mg Intravenous Q6H PRN Wynetta Fines T, MD      . sevelamer carbonate (RENVELA) tablet 800 mg  800 mg Oral TID WC Wynetta Fines T, MD      .  sodium chloride flush (NS) 0.9 % injection 3 mL  3 mL Intravenous Q12H Wynetta Fines T, MD      . sodium chloride flush (NS) 0.9 % injection 3 mL  3 mL Intravenous PRN Lequita Halt, MD       Current Outpatient Medications  Medication Sig Dispense Refill  . acetaminophen (TYLENOL) 500 MG tablet Take 500-1,000 mg by mouth daily as needed (for pain).    Marland Kitchen albuterol (VENTOLIN HFA) 108 (90 Base) MCG/ACT inhaler Inhale 2 puffs into the lungs every 6 (six) hours as needed for wheezing or shortness of breath. 8 g 3  . b complex-vitamin c-folic acid (NEPHRO-VITE) 0.8 MG TABS tablet Take 1 tablet by mouth at bedtime.    Marland Kitchen escitalopram (LEXAPRO) 10 MG tablet Take 10 mg by mouth daily in the afternoon.    Marland Kitchen HYDROcodone-acetaminophen (NORCO/VICODIN) 5-325 MG tablet Take 0.5 tablets by mouth at bedtime as needed (for pain).    . hydrocortisone 1 % lotion Apply 1 application topically 2 (two) times daily as needed for itching.    . sevelamer carbonate (RENVELA) 800 MG tablet Take 800-3,600 mg by mouth See admin instructions. Take 3,600 mg by mouth with each meal and 800 mg with each snack    . SYNTHROID 88 MCG tablet TAKE 1 TABLET BY MOUTH ONCE DAILY BEFORE BREAKFAST (Patient taking differently: Take 88 mcg by mouth daily before breakfast.) 90 tablet 1   Labs: Basic Metabolic Panel: Recent Labs  Lab 01/15/21 1054  NA 132*  K 3.6  CL 88*  CO2 32  GLUCOSE 95  BUN 46*  CREATININE 5.43*  CALCIUM 10.0   Liver Function Tests: Recent Labs  Lab 01/15/21 1054  AST 51*  ALT 25  ALKPHOS 138*  BILITOT 1.1  PROT 6.7  ALBUMIN 3.8   No results for input(s): LIPASE, AMYLASE in the last 168 hours. No results for input(s): AMMONIA in the last 168 hours. CBC: Recent Labs  Lab 01/15/21 1054  WBC 5.7  NEUTROABS 3.8  HGB 11.7*  HCT 37.3  MCV 95.6  PLT 163  Cardiac Enzymes: No results for input(s): CKTOTAL, CKMB, CKMBINDEX, TROPONINI in the last 168 hours. CBG: No results for input(s): GLUCAP in the  last 168 hours. Iron Studies: No results for input(s): IRON, TIBC, TRANSFERRIN, FERRITIN in the last 72 hours. Studies/Results: DG Chest 2 View  Result Date: 01/15/2021 CLINICAL DATA:  Shortness of breath. EXAM: CHEST - 2 VIEW COMPARISON:  CT 12/17/2020.  Chest x-ray 12/12/2020, 04/18/2020. FINDINGS: Mediastinum and hilar structures normal. Stable cardiomegaly. Diffuse bilateral interstitial prominence again noted. Similar findings noted on prior exams. Reference is made to CT report of 12/17/2020. Small bilateral pleural effusions noted. No pneumothorax. IMPRESSION: 1.  Stable cardiomegaly. 2. Diffuse bilateral interstitial prominence again noted. Similar findings noted on prior exam. Reference is made to prior CT report of 12/17/2020. Small bilateral pleural effusions again noted. Electronically Signed   By: Marcello Moores  Register   On: 01/15/2021 11:20    Outpatient Dialysis Orders:  Center: [facility] on MWF. 180NRe, 3.5hours, BFR 400, DR 500, EDW 52kg, 2K/2Ca, AVF 15g, no heparin Mircera 157mcg IV q 2 weeks- last dose 01/11/21 No VDRA Renvela 3 tabs PO TID with meals   Assessment/Plan: 1.  Shortness of breath: Hx of pulmonary fibrosis but worsening SOB over the past month. Patient has been losing weight and presentation is consistent with volume overload. Does not tolerate very high UF goals outpatient, will challenge EDW as tolerated with HD today and again tomorrow. Will need a new dry weight. Primary team ordering echocardiogram to evaluate for heart failure.  2. Interstitial lung disease with Diffuse Alveolar Hemorrhage in July 2021:  In setting of ANCA, GBM positive glomerulonephritis. Followed by pulmonology.  3.  ESRD:  Dialyzes on MWF schedule, extra HD today as above. 4.  Hypertension: BP elevated, likely due to volume overload. Expect improvement with UF with HD.  5.  Anemia: Hemoglobin 11.7. No ESA indicated at this time.  6.  Metabolic bone disease: Calcium slightly  elevated, will use low Ca bath 7.  Nutrition:  Renal diet/fluid restrictions once tolerating PO. Alb 3.8.   Anice Paganini, PA-C 01/15/2021, 1:34 PM  [facility] Pager: [phone]

## 2021-01-15 NOTE — ED Notes (Signed)
Patient sent to dialysis

## 2021-01-15 NOTE — Plan of Care (Signed)

## 2021-01-16 ENCOUNTER — Inpatient Hospital Stay (HOSPITAL_COMMUNITY): Payer: Medicare PPO

## 2021-01-16 DIAGNOSIS — N186 End stage renal disease: Secondary | ICD-10-CM

## 2021-01-16 DIAGNOSIS — R0602 Shortness of breath: Secondary | ICD-10-CM

## 2021-01-16 DIAGNOSIS — E43 Unspecified severe protein-calorie malnutrition: Secondary | ICD-10-CM | POA: Insufficient documentation

## 2021-01-16 DIAGNOSIS — I340 Nonrheumatic mitral (valve) insufficiency: Secondary | ICD-10-CM

## 2021-01-16 DIAGNOSIS — R778 Other specified abnormalities of plasma proteins: Secondary | ICD-10-CM

## 2021-01-16 DIAGNOSIS — I5021 Acute systolic (congestive) heart failure: Secondary | ICD-10-CM

## 2021-01-16 DIAGNOSIS — I509 Heart failure, unspecified: Secondary | ICD-10-CM

## 2021-01-16 LAB — ECHOCARDIOGRAM COMPLETE
AR max vel: 1.54 cm2
AV Area VTI: 1.32 cm2
AV Area mean vel: 1.49 cm2
AV Mean grad: 5 mmHg
AV Peak grad: 10.3 mmHg
AV Vena cont: 0.2 cm
Ao pk vel: 1.61 m/s
Area-P 1/2: 6.54 cm2
Calc EF: 31.7 %
Height: 66 in
MV M vel: 5.58 m/s
MV Peak grad: 124.5 mmHg
MV VTI: 0.89 cm2
P 1/2 time: 278 msec
Radius: 0.6 cm
Single Plane A2C EF: 28.4 %
Single Plane A4C EF: 34.7 %
Weight: 1763.2 oz

## 2021-01-16 LAB — BASIC METABOLIC PANEL
Anion gap: 11 (ref 5–15)
BUN: 27 mg/dL — ABNORMAL HIGH (ref 8–23)
CO2: 29 mmol/L (ref 22–32)
Calcium: 9.7 mg/dL (ref 8.9–10.3)
Chloride: 93 mmol/L — ABNORMAL LOW (ref 98–111)
Creatinine, Ser: 3.94 mg/dL — ABNORMAL HIGH (ref 0.44–1.00)
GFR, Estimated: 11 mL/min — ABNORMAL LOW (ref >60)
Glucose, Bld: 85 mg/dL (ref 70–99)
Potassium: 3.5 mmol/L (ref 3.5–5.1)
Sodium: 133 mmol/L — ABNORMAL LOW (ref 135–145)

## 2021-01-16 LAB — HEPATITIS B SURFACE ANTIBODY, QUANTITATIVE: Hep B S AB Quant (Post): <3.1 m[IU]/mL — ABNORMAL LOW (ref >9.9)

## 2021-01-16 MED ORDER — TRAZODONE HCL 50 MG PO TABS
25.0000 mg | ORAL_TABLET | Freq: Every evening | ORAL | Status: DC | PRN
  Administered 2021-01-16: 50 mg via ORAL
  Filled 2021-01-16: qty 1

## 2021-01-16 MED ORDER — PROSOURCE PLUS PO LIQD
30.0000 mL | Freq: Two times a day (BID) | ORAL | Status: DC
  Administered 2021-01-17: 30 mL via ORAL
  Filled 2021-01-16 (×3): qty 30

## 2021-01-16 MED ORDER — SODIUM CHLORIDE 0.9% FLUSH
3.0000 mL | Freq: Two times a day (BID) | INTRAVENOUS | Status: DC
  Administered 2021-01-17: 3 mL via INTRAVENOUS

## 2021-01-16 MED ORDER — NEPRO/CARBSTEADY PO LIQD: 237.0000 mL | Freq: Two times a day (BID) | ORAL | Status: DC

## 2021-01-16 NOTE — Progress Notes (Signed)
[facility] night shift.  The nursing staff reported that the patient would like something different than diphenhydramine to help her sleep since this did not work.  Trazodone 25 to 50 mg p.o. nightly as needed ordered.  Tennis Must, MD

## 2021-01-16 NOTE — Evaluation (Signed)
Physical Therapy Evaluation Patient Details Name: Jasmine White MRN: [id number] DOB: [birth year] Today's Date: 01/16/2021   History of Present Illness  75 y.o. female admitted 4/26 for progressive SOB and suspected CHF. Pt began 2L home oxygen this week d/t worsening SOB. Pt found to have bilateral pleural effusions on chest xray. PMH including ESRD on dialysis MWF, HTN, hypothyroidism, positive ANCA interstitial lung disease and diffuse alveolar hemorrhage, moderate mitral regurgitation.  Clinical Impression  Pt reports to PT that she does not use supplemental oxygen at baseline, on 2LPM upon arrival. Pt is weaned to RA prior to initiation of gait activities with oxygen sats maintained at or above 96%. Pt demonstrates ability to ambulate limited community distances, with increased WOB and reports of SOB. Pt demonstrates instability towards end of session due to fatigue. Pt will benefit from acute PT to address deficits in overall strength, endurance, and activity tolerance for improved safety and mobility.     Follow Up Recommendations Home health PT    Equipment Recommendations  None recommended by PT    Recommendations for Other Services       Precautions / Restrictions Precautions Precautions: Fall Restrictions Weight Bearing Restrictions: No      Mobility  Bed Mobility Overal bed mobility: Modified Independent (HOB elevated.)                  Transfers Overall transfer level: Independent Equipment used: None                Ambulation/Gait Ambulation/Gait assistance: Min guard Gait Distance (Feet): 75 Feet Assistive device: None Gait Pattern/deviations: Step-through pattern;Decreased stride length;Drifts right/left;Wide base of support Gait velocity: reduced Gait velocity interpretation: 1.31 - 2.62 ft/sec, indicative of limited community ambulator General Gait Details: Pt reports SOB during gait, limited by fatigue.  Stairs            Wheelchair  Mobility    Modified Rankin (Stroke Patients Only)       Balance Overall balance assessment: Independent                                           Pertinent Vitals/Pain Pain Assessment: No/denies pain    Home Living Family/patient expects to be discharged to:: Private residence Living Arrangements: Spouse/significant other Available Help at Discharge: Family Type of Home: House Home Access: [place]: Two level;Able to live on main level with bedroom/bathroom Home Equipment: Gilford Rile - 2 wheels;Wheelchair - Brewing technologist;Toilet riser;Other (comment) (rollator)      Prior Function Level of Independence: Independent         Comments: Pt reports she does not use oxygen at baseline, on 2L [place] upon arrival.     Hand Dominance        Extremity/Trunk Assessment   Upper Extremity Assessment Upper Extremity Assessment: Defer to OT evaluation    Lower Extremity Assessment Lower Extremity Assessment: Generalized weakness    Cervical / Trunk Assessment Cervical / Trunk Assessment: Normal  Communication   Communication: No difficulties  Cognition Arousal/Alertness: Awake/alert Behavior During Therapy: WFL for tasks assessed/performed Overall Cognitive Status: Within Functional Limits for tasks assessed                                        General  Comments General comments (skin integrity, edema, etc.): Pt reports not using supplemental oxygen at baseline. Pt on 2L at rest with oxygen sats in high 90s. Pt weaned to RA and sats at 96% seated at EOB. Pt desats to 90-91% during gait on RA, returning to mid 90s with standing rest break. Pt returned to 2L at end of session.    Exercises     Assessment/Plan    PT Assessment Patient needs continued PT services  PT Problem List Decreased strength;Decreased activity tolerance;Decreased mobility;Cardiopulmonary status limiting activity       PT Treatment  Interventions Gait training;Functional mobility training;Therapeutic activities;Therapeutic exercise;Patient/family education    PT Goals (Current goals can be found in the Care Plan section)  Acute Rehab PT Goals Patient Stated Goal: Return home PT Goal Formulation: With patient Time For Goal Achievement: 01/30/21 Potential to Achieve Goals: Good Additional Goals Additional Goal #1: Pt will ambulate >125 feet with least restrictive device with DOE no greater than 2/4.    Frequency Min 3X/week   Barriers to discharge        Co-evaluation               AM-PAC PT "6 Clicks" Mobility  Outcome Measure Help needed turning from your back to your side while in a flat bed without using bedrails?: None Help needed moving from lying on your back to sitting on the side of a flat bed without using bedrails?: None Help needed moving to and from a bed to a chair (including a wheelchair)?: None Help needed standing up from a chair using your arms (e.g., wheelchair or bedside chair)?: None Help needed to walk in hospital room?: A Little Help needed climbing 3-5 steps with a railing? : A Lot 6 Click Score: 21    End of Session Equipment Utilized During Treatment: Gait belt Activity Tolerance: Patient limited by fatigue;Treatment limited secondary to medical complications (Comment) Patient left: in chair;with call bell/phone within reach Nurse Communication: Mobility status PT Visit Diagnosis: Other abnormalities of gait and mobility (R26.89);Muscle weakness (generalized) (M62.81);Difficulty in walking, not elsewhere classified (R26.2)    Time: 6283-1517 PT Time Calculation (min) (ACUTE ONLY): 17 min   Charges:   PT Evaluation $PT Eval Low Complexity: 1 Low          Acute Rehab  Pager: [phone]   Garwin Brothers, SPT 01/16/2021, 10:56 AM

## 2021-01-16 NOTE — Progress Notes (Signed)
[facility] Progress Note   Subjective:   S/p HD yesterday with 2.5L UF, tolerated well. Reports breathing is slightly better but not at baseline. Interval improvement in CXR. Denies CP, palpitations, dizziness, abdominal pain and nausea. Planned for HD again today.   Objective Vitals:   01/15/21 2026 01/16/21 0238 01/16/21 0430 01/16/21 0432  BP: 121/78  137/85 137/85  Pulse: 98  100 93  Resp: 20  19 20   Temp: 98.5 F (36.9 C)  98.2 F (36.8 C) 98.5 F (36.9 C)  TempSrc:   Oral Oral  SpO2:  97% 97% 100%  Weight:   50 kg   Height:       Physical Exam General: WDWN female, alert and in NAD Heart: RRR, no murmur Lungs: Diffuse crackles b/l lower lobes, no wheezing, respirations unlabored Abdomen: Soft, non-distended, +BS Extremities: No edema b/l lower extremities Dialysis Access:  LUE AVF + bruit  Additional Objective Labs: Basic Metabolic Panel: Recent Labs  Lab 01/15/21 1054 01/16/21 0412  NA 132* 133*  K 3.6 3.5  CL 88* 93*  CO2 32 29  GLUCOSE 95 85  BUN 46* 27*  CREATININE 5.43* 3.94*  CALCIUM 10.0 9.7   Liver Function Tests: Recent Labs  Lab 01/15/21 1054  AST 51*  ALT 25  ALKPHOS 138*  BILITOT 1.1  PROT 6.7  ALBUMIN 3.8   CBC: Recent Labs  Lab 01/15/21 1054  WBC 5.7  NEUTROABS 3.8  HGB 11.7*  HCT 37.3  MCV 95.6  PLT 163   Blood Culture    Component Value Date/Time   SDES FLUID PLEURAL RIGHT 04/05/2020 1530   SDES BRONCHIAL ALVEOLAR LAVAGE 04/05/2020 1530   SPECREQUEST NONE 04/05/2020 1530   SPECREQUEST NONE 04/05/2020 1530   CULT  04/05/2020 1530    NO GROWTH 3 DAYS Performed at [facility], [place] [address] [place], [place] [address]    CULT  04/05/2020 1530    Consistent with normal respiratory flora. Performed at [facility], [place] [address], [place], [place] [address]    REPTSTATUS 04/09/2020 FINAL 04/05/2020 1530   REPTSTATUS 04/07/2020 FINAL 04/05/2020 1530   Studies/Results: DG Chest 1  View  Result Date: 01/16/2021 CLINICAL DATA:  Congestive heart failure EXAM: CHEST  1 VIEW COMPARISON:  01/15/2021 FINDINGS: Pulmonary insufflation is stable. Diffuse interstitial prominence is again identified, slightly improved from a prior examination and likely reflecting trace, improving interstitial pulmonary edema. Small bilateral pleural effusions are unchanged. Superimposed right basilar consolidation is stable. Cardiac size within normal limits. No pneumothorax. IMPRESSION: Interval improvement in probable trace interstitial pulmonary edema. Stable small bilateral pleural effusions. Stable superimposed right basilar consolidation possibly related to superimposed acute infection or aspiration. Electronically Signed   By: Fidela Salisbury MD   On: 01/16/2021 07:20   DG Chest 2 View  Result Date: 01/15/2021 CLINICAL DATA:  Shortness of breath. EXAM: CHEST - 2 VIEW COMPARISON:  CT 12/17/2020.  Chest x-ray 12/12/2020, 04/18/2020. FINDINGS: Mediastinum and hilar structures normal. Stable cardiomegaly. Diffuse bilateral interstitial prominence again noted. Similar findings noted on prior exams. Reference is made to CT report of 12/17/2020. Small bilateral pleural effusions noted. No pneumothorax. IMPRESSION: 1.  Stable cardiomegaly. 2. Diffuse bilateral interstitial prominence again noted. Similar findings noted on prior exam. Reference is made to prior CT report of 12/17/2020. Small bilateral pleural effusions again noted. Electronically Signed   By: Marcello Moores  Register   On: 01/15/2021 11:20   Medications: . sodium chloride     .  carvedilol  3.125 mg Oral BID WC  . Chlorhexidine Gluconate Cloth  6 each Topical Q0600  . Chlorhexidine Gluconate Cloth  6 each Topical Q0600  . escitalopram  10 mg Oral Daily  . heparin  5,000 Units Subcutaneous Q12H  . levothyroxine  88 mcg Oral QAC breakfast  . multivitamin  1 tablet Oral QHS  . sevelamer carbonate  3,600 mg Oral TID WC  . sodium chloride flush  3 mL  Intravenous Q12H    Outpatient Dialysis Orders:  Center: [facility] on MWF. 180NRe, 3.5hours, BFR 400, DR 500, EDW 52kg, 2K/2Ca, AVF 15g, no heparin Mircera 129mcg IV q 2 weeks- last dose 01/11/21 No VDRA Renvela 3 tabs PO TID with meals   Assessment/Plan: 1. Shortness of breath: Hx of pulmonary fibrosis but worsening SOB over the past month. Patient has been losing weight and presentation is consistent with volume overload. Tolerated 2.5L UF on 4/26, interval improvement. Will have HD again today for further UF. Will need a new dry weight. Primary team ordering echocardiogram to evaluate for heart failure.  2. Interstitial lung disease with Diffuse Alveolar Hemorrhage in July 2021:  In setting of ANCA, GBM positive glomerulonephritis. Followed by pulmonology.  3.  ESRD:  Dialyzes on MWF schedule, extra HD on 4/26 for volume. Next HD today. 4.  Hypertension: BP now controlled, not on any antihypertensive medications.  5.  Anemia: Hemoglobin 11.7. No ESA indicated at this time.  6.  Metabolic bone disease: Calcium slightly elevated, will use low Ca bath 7.  Nutrition:  Renal diet/fluid restrictions once tolerating PO. Alb 3.8.   Anice Paganini, PA-C 01/16/2021, 8:16 AM  [facility] Pager: [phone]

## 2021-01-16 NOTE — Consult Note (Signed)
Cardiology Consultation:   Patient ID: Jasmine White MRN: [id number]; DOB: 1946-01-22  Admit date: 01/15/2021 Date of Consult: 01/16/2021  PCP:  Leeroy Cha, [place]  Cardiologist:  No primary care provider on file.  Advanced Practice Provider:  No care team member to display Electrophysiologist:  None        Patient Profile:   Jasmine White is a 75 y.o. female with a hx of ESRD who is being seen today for the evaluation of CHF at the request of Dr Darrick Meigs.  History of Present Illness:   Jasmine White is a 75 year old woman admitted with new onset congestive heart failure.  She has an interesting history.  She reports that she was very healthy until 2020 when she developed COVID-19 pneumonia.  She used to walk 5 to 6 miles every day and work out at [employer].  She developed multiple problems including ANCA positive interstitial lung disease and end-stage renal disease.  She reports that she has been on hemodialysis now for approximately 6 months.  The patient describes progressive fatigue.  She has developed shortness of breath at rest, orthopnea, and PND over the last 4 to 6 weeks.  She frequently has to sit up on the bedside at night and lean forward in order to breathe well.  She feels a little bit better since undergoing back-to-back dialysis the past few days.  She had developed ankle swelling and this is now resolved.  She continues to experience orthopnea and PND with slight improvement.  She has felt a pressure in her chest at times, but no exertional angina.  The patient is a lifelong non-smoker.  During her hospitalization, she is found to have markedly elevated BNP.  She has small bilateral pleural effusions with mild interstitial edema.  An echocardiogram was performed today and demonstrates new LV dysfunction with an LVEF of 30 to 35%, moderately severe mitral regurgitation, and moderate aortic insufficiency.   Past Medical History:   Diagnosis Date  . Arthritis   . Bruises easily   . Cancer ([place])    basal skin cancer  . Chronic back pain    HNP/stenosis and radiculopathy  . Hyperlipidemia    takes Pravastatin daily  . Hypertension    takes Hyzaar and toprol daily  . Hypothyroidism    takes Synthroid daily  . Insomnia    takes Elevil nightly  . Joint pain   . Joint swelling   . Low BP    past some sedation  . Nocturia   . PONV (postoperative nausea and vomiting)    with knee replacement b/p dropped  . Sinusitis    finished zpak yesterday  . Urinary frequency     Past Surgical History:  Procedure Laterality Date  . A/V FISTULAGRAM N/A 05/17/2020   Procedure: A/V FISTULAGRAM - Left Arm;  Surgeon: Marty Heck, MD;  Location: [facility];  Service: Cardiovascular;  Laterality: N/A;  . ABDOMINAL HYSTERECTOMY    . AV FISTULA PLACEMENT Left 04/02/2020   Procedure: ARTERIOVENOUS (AV) FISTULA CREATION;  Surgeon: Rosetta Posner, MD;  Location: [facility];  Service: Vascular;  Laterality: Left;  . BACK SURGERY    . basal cell skin cancer     on face and forehead  . bil knee replacements    . BREAST BIOPSY     left breast/benign  . COLONOSCOPY    . IR FLUORO GUIDE CV LINE RIGHT  03/20/2020  . IR  US GUIDE VASC ACCESS RIGHT  03/20/2020  . LUMBAR LAMINECTOMY/DECOMPRESSION MICRODISCECTOMY  08/13/2012   Procedure: LUMBAR LAMINECTOMY/DECOMPRESSION MICRODISCECTOMY 1 LEVEL;  Surgeon: Erline Levine, MD;  Location: [facility];  Service: Neurosurgery;  Laterality: Left;  Left lumbar one-two Microdiskectomy  . PERIPHERAL VASCULAR BALLOON ANGIOPLASTY Left 05/17/2020   Procedure: PERIPHERAL VASCULAR BALLOON ANGIOPLASTY;  Surgeon: Marty Heck, MD;  Location: [facility];  Service: Cardiovascular;  Laterality: Left;  Arm fistula      Home Medications:  Prior to Admission medications   Medication Sig Start Date End Date Taking? Authorizing Provider  acetaminophen (TYLENOL) 500 MG tablet Take  500-1,000 mg by mouth daily as needed (for pain).   Yes [provider]  albuterol (VENTOLIN HFA) 108 (90 Base) MCG/ACT inhaler Inhale 2 puffs into the lungs every 6 (six) hours as needed for wheezing or shortness of breath. 12/19/20  Yes Chesley Mires, MD  b complex-vitamin c-folic acid (NEPHRO-VITE) 0.8 MG TABS tablet Take 1 tablet by mouth at bedtime.   Yes [provider]  escitalopram (LEXAPRO) 10 MG tablet Take 10 mg by mouth daily in the afternoon.   Yes [provider]  HYDROcodone-acetaminophen (NORCO/VICODIN) 5-325 MG tablet Take 0.5 tablets by mouth at bedtime as needed (for pain).   Yes [provider]  hydrocortisone 1 % lotion Apply 1 application topically 2 (two) times daily as needed for itching.   Yes [provider]  sevelamer carbonate (RENVELA) 800 MG tablet Take 800-3,600 mg by mouth See admin instructions. Take 3,600 mg by mouth with each meal and 800 mg with each snack   Yes [provider]  SYNTHROID 88 MCG tablet TAKE 1 TABLET BY [place] BREAKFAST Patient taking differently: Take 88 mcg by mouth daily before breakfast. 03/01/20  Yes Chevis Pretty, FNP    Inpatient Medications: Scheduled Meds: . (feeding supplement) PROSource Plus  30 mL Oral BID BM  . carvedilol  3.125 mg Oral BID WC  . Chlorhexidine Gluconate Cloth  6 each Topical Q0600  . Chlorhexidine Gluconate Cloth  6 each Topical Q0600  . escitalopram  10 mg Oral Daily  . feeding supplement (NEPRO CARB STEADY)  237 mL Oral BID BM  . heparin  5,000 Units Subcutaneous Q12H  . levothyroxine  88 mcg Oral QAC breakfast  . multivitamin  1 tablet Oral QHS  . sevelamer carbonate  3,600 mg Oral TID WC  . sodium chloride flush  3 mL Intravenous Q12H   Continuous Infusions: . sodium chloride     PRN Meds: sodium chloride, acetaminophen, albuterol, diphenhydrAMINE, HYDROcodone-acetaminophen, ondansetron (ZOFRAN) IV, sevelamer carbonate, sodium  chloride flush  Allergies:    Allergies  Allergen Reactions  . Hctz [Hydrochlorothiazide] Other (See Comments)    Hx of severe Hyponatremia while taking HCTZ - now contraindicated  . Other Other (See Comments)    If ever given "strong" pain medication, the patient will require something to counteract the nausea  . Percocet [Oxycodone-Acetaminophen] Nausea Only  . Ultram [Tramadol] Nausea Only and Other (See Comments)    Patient remarked this made her "feel crazy" (also)    Social History:   Social History   Socioeconomic History  . Marital status: Married    Spouse name: Ray  . Number of children: 2  . Years of education: Not on file  . Highest education level: Not on file  Occupational History  . Occupation: retired  Tobacco Use  . Smoking status: Never Smoker  . Smokeless  tobacco: Never Used  Vaping Use  . Vaping Use: Never used  Substance and Sexual Activity  . Alcohol use: No    Alcohol/week: 0.0 standard drinks  . Drug use: No  . Sexual activity: Not Currently  Other Topics Concern  . Not on file  Social History Narrative  . Not on file   Social Determinants of Health   Financial Resource Strain: Not on file  Food Insecurity: Not on file  Transportation Needs: Not on file  Physical Activity: Not on file  Stress: Not on file  Social Connections: Not on file  Intimate Partner Violence: Not on file    Family History:    Family History  Problem Relation Age of Onset  . Hypertension Mother   . Heart disease Mother   . Stroke Father   . Heart disease Father   . Alcohol abuse Father   . Arthritis Brother   . Cancer Brother 57       prostate  . Arthritis Brother   . Colon cancer Neg Hx   . Colon polyps Neg Hx   . Stomach cancer Neg Hx   . Rectal cancer Neg Hx      ROS:  Please see the history of present illness.  All other ROS reviewed and negative.     Physical Exam/Data:   Vitals:   01/15/21 2026 01/16/21 0238 01/16/21 0430 01/16/21 0432   BP: 121/78  137/85 137/85  Pulse: 98  100 93  Resp: 20  19 20   Temp: 98.5 F (36.9 C)  98.2 F (36.8 C) 98.5 F (36.9 C)  TempSrc:   Oral Oral  SpO2:  97% 97% 100%  Weight:   50 kg   Height:        Intake/Output Summary (Last 24 hours) at 01/16/2021 1853 Last data filed at 01/16/2021 1400 Gross per 24 hour  Intake 725 ml  Output 0 ml  Net 725 ml   Last 3 Weights 01/16/2021 01/15/2021 01/15/2021  Weight (lbs) 110 lb 3.2 oz 239 lb 10.2 oz 108 lb 7.5 oz  Weight (kg) 49.986 kg 108.7 kg 49.2 kg     Body mass index is 17.79 kg/m.  General: Thin woman in no acute distress HEENT: normal Lymph: no adenopathy Neck: no JVD Endocrine:  No thryomegaly Vascular: No carotid bruits; Cardiac:  normal S1, S2; RRR; 3/6 holosystolic murmur at the left axilla Lungs:  clear to auscultation bilaterally, no wheezing, rhonchi or rales  Abd: soft, nontender, no hepatomegaly  Ext: no edema Musculoskeletal:  No deformities, BUE and BLE strength normal and equal Skin: warm and dry  Neuro:  CNs 2-12 intact, no focal abnormalities noted Psych:  Normal affect   EKG:  The EKG was personally reviewed and demonstrates: Sinus tachycardia 105 bpm, LVH, wandering baseline  Relevant CV Studies: 2D echocardiogram: IMPRESSIONS    1. Left ventricular ejection fraction, by estimation, is 30 to 35%. The  left ventricle has moderately decreased function. The left ventricle  demonstrates global hypokinesis. The left ventricular internal cavity size  was mildly dilated. There is mild  left ventricular hypertrophy. Left ventricular diastolic parameters are  consistent with Grade II diastolic dysfunction (pseudonormalization).  Elevated left atrial pressure.  2. Right ventricular systolic function is normal. The right ventricular  size is normal. There is mildly elevated pulmonary artery systolic  pressure. The estimated right ventricular systolic pressure is 44.0 mmHg.  3. Left atrial size was severely  dilated.  4. Right atrial size was mildly  dilated.  5. The mitral valve is abnormal. Moderate to severe mitral valve  regurgitation.  6. Tricuspid valve regurgitation is moderate.  7. The aortic valve is tricuspid. Aortic valve regurgitation is moderate.  Mild to moderate aortic valve sclerosis/calcification is present, without  any evidence of aortic stenosis.  8. The inferior vena cava is normal in size with <50% respiratory  variability, suggesting right atrial pressure of 8 mmHg.   Laboratory Data:  High Sensitivity Troponin:   Recent Labs  Lab 01/15/21 1054 01/15/21 1254  TROPONINIHS 87* 77*     Chemistry Recent Labs  Lab 01/15/21 1054 01/16/21 0412  NA 132* 133*  K 3.6 3.5  CL 88* 93*  CO2 32 29  GLUCOSE 95 85  BUN 46* 27*  CREATININE 5.43* 3.94*  CALCIUM 10.0 9.7  GFRNONAA 8* 11*  ANIONGAP 12 11    Recent Labs  Lab 01/15/21 1054  PROT 6.7  ALBUMIN 3.8  AST 51*  ALT 25  ALKPHOS 138*  BILITOT 1.1   Hematology Recent Labs  Lab 01/15/21 1054  WBC 5.7  RBC 3.90  HGB 11.7*  HCT 37.3  MCV 95.6  MCH 30.0  MCHC 31.4  RDW 17.1*  PLT 163   BNPNo results for input(s): BNP, PROBNP in the last 168 hours.  DDimer No results for input(s): DDIMER in the last 168 hours.   Radiology/Studies:  DG Chest 1 View  Result Date: 01/16/2021 CLINICAL DATA:  Congestive heart failure EXAM: CHEST  1 VIEW COMPARISON:  01/15/2021 FINDINGS: Pulmonary insufflation is stable. Diffuse interstitial prominence is again identified, slightly improved from a prior examination and likely reflecting trace, improving interstitial pulmonary edema. Small bilateral pleural effusions are unchanged. Superimposed right basilar consolidation is stable. Cardiac size within normal limits. No pneumothorax. IMPRESSION: Interval improvement in probable trace interstitial pulmonary edema. Stable small bilateral pleural effusions. Stable superimposed right basilar consolidation possibly related  to superimposed acute infection or aspiration. Electronically Signed   By: Fidela Salisbury MD   On: 01/16/2021 07:20   DG Chest 2 View  Result Date: 01/15/2021 CLINICAL DATA:  Shortness of breath. EXAM: CHEST - 2 VIEW COMPARISON:  CT 12/17/2020.  Chest x-ray 12/12/2020, 04/18/2020. FINDINGS: Mediastinum and hilar structures normal. Stable cardiomegaly. Diffuse bilateral interstitial prominence again noted. Similar findings noted on prior exams. Reference is made to CT report of 12/17/2020. Small bilateral pleural effusions noted. No pneumothorax. IMPRESSION: 1.  Stable cardiomegaly. 2. Diffuse bilateral interstitial prominence again noted. Similar findings noted on prior exam. Reference is made to prior CT report of 12/17/2020. Small bilateral pleural effusions again noted. Electronically Signed   By: Marcello Moores  Register   On: 01/15/2021 11:20   ECHOCARDIOGRAM COMPLETE  Result Date: 01/16/2021    ECHOCARDIOGRAM REPORT   Patient Name:   Jasmine White Date of Exam: 01/16/2021 Medical Rec #:  [id number]       Height:       66.0 in Accession #:    [id number]      Weight:       110.2 lb Date of Birth:  October 11, 1945       BSA:          1.552 m Patient Age:    72 years        BP:           137/85 mmHg Patient Gender: F               HR:  90 bpm. Exam Location:  Inpatient Procedure: 2D Echo, Cardiac Doppler, Color Doppler and Strain Analysis  Results communicated to Dr Darrick Meigs at 1435. Indications:     CHF  History:         Patient has prior history of Echocardiogram examinations, most                  recent 04/06/2020. Signs/Symptoms:Hypotension; Risk                  Factors:Hypertension and Dyslipidemia.  Sonographer:     H[place] Referring Phys:  [id number] Lequita Halt Diagnosing Phys: Oswaldo Milian MD IMPRESSIONS  1. Left ventricular ejection fraction, by estimation, is 30 to 35%. The left ventricle has moderately decreased function. The left ventricle demonstrates global hypokinesis. The left  ventricular internal cavity size was mildly dilated. There is mild left ventricular hypertrophy. Left ventricular diastolic parameters are consistent with Grade II diastolic dysfunction (pseudonormalization). Elevated left atrial pressure.  2. Right ventricular systolic function is normal. The right ventricular size is normal. There is mildly elevated pulmonary artery systolic pressure. The estimated right ventricular systolic pressure is 41.9 mmHg.  3. Left atrial size was severely dilated.  4. Right atrial size was mildly dilated.  5. The mitral valve is abnormal. Moderate to severe mitral valve regurgitation.  6. Tricuspid valve regurgitation is moderate.  7. The aortic valve is tricuspid. Aortic valve regurgitation is moderate. Mild to moderate aortic valve sclerosis/calcification is present, without any evidence of aortic stenosis.  8. The inferior vena cava is normal in size with <50% respiratory variability, suggesting right atrial pressure of 8 mmHg. FINDINGS  Left Ventricle: Left ventricular ejection fraction, by estimation, is 30 to 35%. The left ventricle has moderately decreased function. The left ventricle demonstrates global hypokinesis. The left ventricular internal cavity size was mildly dilated. There is mild left ventricular hypertrophy. Left ventricular diastolic parameters are consistent with Grade II diastolic dysfunction (pseudonormalization). Elevated left atrial pressure. Right Ventricle: The right ventricular size is normal. No increase in right ventricular wall thickness. Right ventricular systolic function is normal. There is mildly elevated pulmonary artery systolic pressure. The tricuspid regurgitant velocity is 2.66  m/s, and with an assumed right atrial pressure of 8 mmHg, the estimated right ventricular systolic pressure is 62.2 mmHg. Left Atrium: Left atrial size was severely dilated. Right Atrium: Right atrial size was mildly dilated. Pericardium: There is no evidence of pericardial  effusion. Mitral Valve: The mitral valve is abnormal. Moderate to severe mitral valve regurgitation. MV peak gradient, 11.0 mmHg. The mean mitral valve gradient is 6.0 mmHg. Tricuspid Valve: The tricuspid valve is normal in structure. Tricuspid valve regurgitation is moderate. Aortic Valve: The aortic valve is tricuspid. Aortic valve regurgitation is moderate. Aortic regurgitation PHT measures 278 msec. Mild to moderate aortic valve sclerosis/calcification is present, without any evidence of aortic stenosis. Aortic valve mean gradient measures 5.0 mmHg. Aortic valve peak gradient measures 10.3 mmHg. Aortic valve area, by VTI measures 1.32 cm. Pulmonic Valve: The pulmonic valve was not well visualized. Pulmonic valve regurgitation is not visualized. Aorta: The aortic root and ascending aorta are structurally normal, with no evidence of dilitation. Venous: The inferior vena cava is normal in size with less than 50% respiratory variability, suggesting right atrial pressure of 8 mmHg. IAS/Shunts: No atrial level shunt detected by color flow Doppler. Additional Comments: There is a small pleural effusion in the left lateral region.  LEFT VENTRICLE PLAX 2D LVIDd:  5.40 cm      Diastology LV PW:         1.10 cm      LV e' medial:    3.71 cm/s LV IVS:        1.20 cm      LV E/e' medial:  46.6 LVOT diam:     1.90 cm      LV e' lateral:   5.57 cm/s LV SV:         31           LV E/e' lateral: 31.1 LV SV Index:   20 LVOT Area:     2.84 cm  LV Volumes (MOD) LV vol d, MOD A2C: 115.0 ml LV vol d, MOD A4C: 118.0 ml LV vol s, MOD A2C: 82.3 ml LV vol s, MOD A4C: 77.1 ml LV SV MOD A2C:     32.7 ml LV SV MOD A4C:     118.0 ml LV SV MOD BP:      37.8 ml RIGHT VENTRICLE RV S prime:     15.40 cm/s TAPSE (M-mode): 2.2 cm LEFT ATRIUM              Index       RIGHT ATRIUM           Index LA diam:        3.70 cm  2.38 cm/m  RA Area:     17.00 cm LA Vol (A2C):   96.2 ml  61.97 ml/m RA Volume:   45.90 ml  29.57 ml/m LA Vol (A4C):    111.0 ml 71.51 ml/m LA Biplane Vol: 109.0 ml 70.22 ml/m  AORTIC VALVE                    PULMONIC VALVE AV Area (Vmax):    1.54 cm     PV Vmax:       0.53 m/s AV Area (Vmean):   1.49 cm     PV Vmean:      47.800 cm/s AV Area (VTI):     1.32 cm     PV VTI:        0.108 m AV Vmax:           160.50 cm/s  PV Peak grad:  1.1 mmHg AV Vmean:          106.450 cm/s PV Mean grad:  1.0 mmHg AV VTI:            0.238 m AV Peak Grad:      10.3 mmHg AV Mean Grad:      5.0 mmHg LVOT Vmax:         87.30 cm/s LVOT Vmean:        56.100 cm/s LVOT VTI:          0.111 m LVOT/AV VTI ratio: 0.47 AI PHT:            278 msec AR Vena Contracta: 0.20 cm  AORTA Ao Root diam: 3.10 cm Ao Asc diam:  3.40 cm MITRAL VALVE                 TRICUSPID VALVE MV Area (PHT): 6.54 cm      TR Peak grad:   28.3 mmHg MV Area VTI:   0.89 cm      TR Vmax:        266.00 cm/s MV Peak grad:  11.0 mmHg MV Mean grad:  6.0 mmHg  SHUNTS MV Vmax:       1.66 m/s      Systemic VTI:  0.11 m MV Vmean:      113.0 cm/s    Systemic Diam: 1.90 cm MV Decel Time: 116 msec MR Peak grad:    124.5 mmHg MR Mean grad:    73.0 mmHg MR Vmax:         558.00 cm/s MR Vmean:        393.0 cm/s MR PISA:         2.26 cm MR PISA Eff ROA: 10 mm MR PISA Radius:  0.60 cm MV E velocity: 173.00 cm/s MV A velocity: 124.00 cm/s MV E/A ratio:  1.40 Oswaldo Milian MD Electronically signed by Oswaldo Milian MD Signature Date/Time: 01/16/2021/2:30:10 PM    Final (Updated)      Assessment and Plan:   1. Acute systolic heart failure with newly diagnosed severe LV systolic dysfunction 2. Valvular heart disease with moderately severe mitral and aortic regurgitation 3. End-stage renal disease  The patient has classic symptoms of progressive heart failure over the past 4 to 6 weeks with symptoms of leg edema, progressive dyspnea, weakness and fatigue, orthopnea, and PND.  She has had interval decline in LV function with normal LVEF in July 2021 at the time of her last  echocardiogram and now with severe LV dysfunction on her recent echo study.  In general it appears that the patient has really struggled since the COVID infection in 2020 and now with progression to end-stage renal disease requiring hemodialysis over the last 6 months.  I reviewed potential etiologies of systolic heart failure/LV dysfunction with the patient today.  It is possible that she has a valvular cardiomyopathy or an ischemic cardiomyopathy.  I have recommended that she undergo right and left heart catheterization for definitive evaluation of both her coronary anatomy and hemodynamics.  She will require femoral access because of hemodialysis. I have reviewed the risks, indications, and alternatives to cardiac catheterization, possible angioplasty, and stenting with the patient. Risks include but are not limited to bleeding, infection, vascular injury, stroke, myocardial infection, arrhythmia, kidney injury, radiation-related injury in the case of prolonged fluoroscopy use, emergency cardiac surgery, and death. The patient understands the risks of serious complication is 1-2 in 3762 with diagnostic cardiac cath and 1-2% or less with angioplasty/stenting.  Medical therapy is limited in this patient currently undergoing daily dialysis to remove volume.  Would continue carvedilol for now.  Further recommendations based on results of her heart catheterization.  We may ultimately need to perform a transesophageal echo for further assessment of her valvular dysfunction.  I am concerned that her treatment options will be limited as she will probably not be a candidate for transcatheter edge-to-edge mitral valve repair because of her thickened leaflets and resting transmitral mean gradient of 6 mmHg.  We will follow up tomorrow after her heart catheterization.  Full informed consent is obtained tonight.  I spoke with the patient's nurse and there are plans to perform hemodialysis later tonight.  The patient thinks  she will be able to lie flat for the cath procedure as long as she has supplemental oxygen.   Risk Assessment/Risk Scores:        New York Heart Association (NYHA) Functional Class NYHA Class IV        For questions or updates, please contact [facility] Please consult [email or web address] for contact info under    Signed, Sherren Mocha, MD  01/16/2021 6:53 PM

## 2021-01-16 NOTE — Progress Notes (Signed)
Heart Failure Navigator Progress Note  Assessed for Heart & Vascular TOC clinic readiness.  Unfortunately at this time the patient does not meet criteria due to ESRD on HD.   Navigator available for reassessment of patient.   Eliana Lueth, RN, BSN Heart Failure Nurse Navigator [phone]  

## 2021-01-16 NOTE — Progress Notes (Signed)
Initial Nutrition Assessment  DOCUMENTATION CODES:   Severe malnutrition in context of chronic illness,Underweight  INTERVENTION:  Provide Nepro Shake po BID, each supplement provides 425 kcal and 19 grams protein.  Provide 30 ml Prosource Plus po BID, each supplement provides 100 kcal and 15 grams of protein.   Encourage adequate PO intake.   NUTRITION DIAGNOSIS:   Severe Malnutrition related to chronic illness (ESRD HD, CHF) as evidenced by severe fat depletion,severe muscle depletion.  GOAL:   Patient will meet greater than or equal to 90% of their needs  MONITOR:   PO intake,Supplement acceptance,Skin,Weight trends,Labs,I & O's  REASON FOR ASSESSMENT:   Malnutrition Screening Tool    ASSESSMENT:   75 y.o. female with medical history significant of ESRD on HD MWF, positive ANCA interstitial lung disease and diffuse alveolar hemorrhage, moderate mitral regurgitation, hypothyroidism, presented with increasing shortness of breath. Pt with new onset CHF.  Pt reports having a good appetite. Meal completion 50%. Pt reports having a decreased appetite prior to admission which has been ongoing over the past 1 year. Pt reports still consuming 3 meals a day with at least one protein shake (premier protein) and liquid protein supplements twice daily. Pt weight weight loss related to fluid status. Per MD, pt admitted with volume overload. Pt reports breathing has improved since removal of excess fluid. Plans for HD today.   NUTRITION - FOCUSED PHYSICAL EXAM:  Flowsheet Row Most Recent Value  Orbital Region Unable to assess  Upper Arm Region Moderate depletion  Thoracic and Lumbar Region Severe depletion  Buccal Region Unable to assess  Temple Region Unable to assess  Clavicle Bone Region Moderate depletion  Clavicle and Acromion Bone Region Moderate depletion  Scapular Bone Region Unable to assess  Dorsal Hand Unable to assess  Patellar Region Moderate depletion  Anterior  Thigh Region Severe depletion  Posterior Calf Region Severe depletion  Edema (RD Assessment) None  Hair Reviewed  Eyes Reviewed  Mouth Reviewed  Skin Reviewed  Nails Reviewed     Labs and medications reviewed.   Diet Order:   Diet Order            Diet Heart Room service appropriate? Yes; Fluid consistency: Thin; Fluid restriction: 2000 mL Fluid  Diet effective now                 EDUCATION NEEDS:   Not appropriate for education at this time  Skin:  Skin Assessment: Reviewed RN Assessment  Last BM:  4/26  Height:   Ht Readings from Last 1 Encounters:  01/15/21 5\' 6"  (1.676 m)    Weight:   Wt Readings from Last 1 Encounters:  01/16/21 50 kg    Ideal Body Weight:  59 kg  BMI:  Body mass index is 17.79 kg/m.  Estimated Nutritional Needs:   Kcal:  1800-2000  Protein:  85-100 grams  Fluid:  2 L/day  Corrin Parker, MS, RD, LDN RD pager number/after hours weekend pager number on Amion.

## 2021-01-16 NOTE — Progress Notes (Signed)
[facility]  PROGRESS NOTE  Jasmine White ZJQ:[id number] DOB: [birth year] DOA: 01/15/2021 PCP: Leeroy Cha, MD   Brief HPI:   75 year old female with history of ESRD on HD MWF, positive ANCA interstitial lung disease, diffuse lower hemorrhage, moderate mitral regurgitation, hypothyroidism presented with worsening shortness of breath.  Patient symptoms started about a month ago and gradually got worse.  Patient also has been racing heartbeat.  She also had chest tightness and went to see his pulmonologist who suspected new onset CHF and sent her to the ED.  In the ED chest x-ray showed bilateral pleural effusion.  BNP was elevated to 3681.    Subjective   Patient seen and examined, she underwent hemodialysis yesterday and is breathing better this morning.   Assessment/Plan:     1. New onset CHF-BNP was elevated to 3,681, she does have moderate mitral regurgitation.  Echocardiogram done today shows EF of 30 to 35%, left ventricle has moderately decreased function.  Also shows global hypokinesis.  Patient is getting hemodialysis.  Continue Coreg 3.125 mg p.o. twice daily.  Will consult cardiology for further recommendations. 2. ESRD-on hemodialysis.  Nephrology following. 3. Hypertension-patient has not been diagnosed hypertension in the past.  Started on Coreg.  Blood pressure is stable. 4. Pulmonary fibrosis-followed by pulmonology as outpatient. 5. Hypothyroidism-continue Synthroid   Scheduled medications:   . (feeding supplement) PROSource Plus  30 mL Oral BID BM  . carvedilol  3.125 mg Oral BID WC  . Chlorhexidine Gluconate Cloth  6 each Topical Q0600  . Chlorhexidine Gluconate Cloth  6 each Topical Q0600  . escitalopram  10 mg Oral Daily  . feeding supplement (NEPRO CARB STEADY)  237 mL Oral BID BM  . heparin  5,000 Units Subcutaneous Q12H  . levothyroxine  88 mcg Oral QAC breakfast  . multivitamin  1 tablet Oral QHS  . sevelamer carbonate  3,600 mg Oral TID  WC  . sodium chloride flush  3 mL Intravenous Q12H         Data Reviewed:   CBG:  No results for input(s): GLUCAP in the last 168 hours.  SpO2: 100 % O2 Flow Rate (L/min): 2 L/min    Vitals:   01/15/21 2026 01/16/21 0238 01/16/21 0430 01/16/21 0432  BP: 121/78  137/85 137/85  Pulse: 98  100 93  Resp: 20  19 20   Temp: 98.5 F (36.9 C)  98.2 F (36.8 C) 98.5 F (36.9 C)  TempSrc:   Oral Oral  SpO2:  97% 97% 100%  Weight:   50 kg   Height:         Intake/Output Summary (Last 24 hours) at 01/16/2021 1809 Last data filed at 01/16/2021 0600 Gross per 24 hour  Intake 125 ml  Output 0 ml  Net 125 ml    04/25 1901 - 04/27 0700 In: 125 [P.O.:125] Out: 2500   Filed Weights   01/15/21 1745 01/15/21 1806 01/16/21 0430  Weight: 49.2 kg 108.7 kg 50 kg    CBC:  Recent Labs  Lab 01/15/21 1054  WBC 5.7  HGB 11.7*  HCT 37.3  PLT 163  MCV 95.6  MCH 30.0  MCHC 31.4  RDW 17.1*  LYMPHSABS 1.2  MONOABS 0.5  EOSABS 0.1  BASOSABS 0.1    Complete metabolic panel:  Recent Labs  Lab 01/15/21 1054 01/15/21 1500 01/16/21 0412  NA 132*  --  133*  K 3.6  --  3.5  CL 88*  --  93*  CO2 32  --  29  GLUCOSE 95  --  85  BUN 46*  --  27*  CREATININE 5.43*  --  3.94*  CALCIUM 10.0  --  9.7  AST 51*  --   --   ALT 25  --   --   ALKPHOS 138*  --   --   BILITOT 1.1  --   --   ALBUMIN 3.8  --   --   TSH  --  1.295  --     No results for input(s): LIPASE, AMYLASE in the last 168 hours.  Recent Labs  Lab 01/15/21 1139  [place]    ------------------------------------------------------------------------------------------------------------------ No results for input(s): CHOL, HDL, LDLCALC, TRIG, CHOLHDL, LDLDIRECT in the last 72 hours.  Lab Results  Component Value Date   HGBA1C 5.9 (H) 03/21/2020   ------------------------------------------------------------------------------------------------------------------ Recent Labs    01/15/21 1500   TSH 1.295   ------------------------------------------------------------------------------------------------------------------ No results for input(s): VITAMINB12, FOLATE, FERRITIN, TIBC, IRON, RETICCTPCT in the last 72 hours.  Coagulation profile  No results for input(s): INR, PROTIME in the last 168 hours.  No results for input(s): DDIMER in the last 72 hours.  Cardiac Enzymes  No results for input(s): CKMB, TROPONINI, MYOGLOBIN in the last 168 hours.  Invalid input(s): CK ------------------------------------------------------------------------------------------------------------------    Component Value Date/Time   BNP 2,852.3 (H) 04/05/2020 1638     Antibiotics: Anti-infectives (From admission, onward)   None       Radiology Reports  DG Chest 1 View  Result Date: 01/16/2021 CLINICAL DATA:  Congestive heart failure EXAM: CHEST  1 VIEW COMPARISON:  01/15/2021 FINDINGS: Pulmonary insufflation is stable. Diffuse interstitial prominence is again identified, slightly improved from a prior examination and likely reflecting trace, improving interstitial pulmonary edema. Small bilateral pleural effusions are unchanged. Superimposed right basilar consolidation is stable. Cardiac size within normal limits. No pneumothorax. IMPRESSION: Interval improvement in probable trace interstitial pulmonary edema. Stable small bilateral pleural effusions. Stable superimposed right basilar consolidation possibly related to superimposed acute infection or aspiration. Electronically Signed   By: Fidela Salisbury MD   On: 01/16/2021 07:20   DG Chest 2 View  Result Date: 01/15/2021 CLINICAL DATA:  Shortness of breath. EXAM: CHEST - 2 VIEW COMPARISON:  CT 12/17/2020.  Chest x-ray 12/12/2020, 04/18/2020. FINDINGS: Mediastinum and hilar structures normal. Stable cardiomegaly. Diffuse bilateral interstitial prominence again noted. Similar findings noted on prior exams. Reference is made to CT report of  12/17/2020. Small bilateral pleural effusions noted. No pneumothorax. IMPRESSION: 1.  Stable cardiomegaly. 2. Diffuse bilateral interstitial prominence again noted. Similar findings noted on prior exam. Reference is made to prior CT report of 12/17/2020. Small bilateral pleural effusions again noted. Electronically Signed   By: Marcello Moores  Register   On: 01/15/2021 11:20   ECHOCARDIOGRAM COMPLETE  Result Date: 01/16/2021    ECHOCARDIOGRAM REPORT   Patient Name:   DIANA DAVENPORT Date of Exam: 01/16/2021 Medical Rec #:  [id number]       Height:       66.0 in Accession #:    [id number]      Weight:       110.2 lb Date of Birth:  1946-06-02       BSA:          1.552 m Patient Age:    55 years        BP:           137/85 mmHg Patient Gender: F  HR:           90 bpm. Exam Location:  Inpatient Procedure: 2D Echo, Cardiac Doppler, Color Doppler and Strain Analysis  Results communicated to Dr Darrick Meigs at 1435. Indications:     CHF  History:         Patient has prior history of Echocardiogram examinations, most                  recent 04/06/2020. Signs/Symptoms:Hypotension; Risk                  Factors:Hypertension and Dyslipidemia.  Sonographer:     [place] Referring Phys:  [id number] Lequita Halt Diagnosing Phys: Oswaldo Milian MD IMPRESSIONS  1. Left ventricular ejection fraction, by estimation, is 30 to 35%. The left ventricle has moderately decreased function. The left ventricle demonstrates global hypokinesis. The left ventricular internal cavity size was mildly dilated. There is mild left ventricular hypertrophy. Left ventricular diastolic parameters are consistent with Grade II diastolic dysfunction (pseudonormalization). Elevated left atrial pressure.  2. Right ventricular systolic function is normal. The right ventricular size is normal. There is mildly elevated pulmonary artery systolic pressure. The estimated right ventricular systolic pressure is 24.2 mmHg.  3. Left atrial size was severely  dilated.  4. Right atrial size was mildly dilated.  5. The mitral valve is abnormal. Moderate to severe mitral valve regurgitation.  6. Tricuspid valve regurgitation is moderate.  7. The aortic valve is tricuspid. Aortic valve regurgitation is moderate. Mild to moderate aortic valve sclerosis/calcification is present, without any evidence of aortic stenosis.  8. The inferior vena cava is normal in size with <50% respiratory variability, suggesting right atrial pressure of 8 mmHg. FINDINGS  Left Ventricle: Left ventricular ejection fraction, by estimation, is 30 to 35%. The left ventricle has moderately decreased function. The left ventricle demonstrates global hypokinesis. The left ventricular internal cavity size was mildly dilated. There is mild left ventricular hypertrophy. Left ventricular diastolic parameters are consistent with Grade II diastolic dysfunction (pseudonormalization). Elevated left atrial pressure. Right Ventricle: The right ventricular size is normal. No increase in right ventricular wall thickness. Right ventricular systolic function is normal. There is mildly elevated pulmonary artery systolic pressure. The tricuspid regurgitant velocity is 2.66  m/s, and with an assumed right atrial pressure of 8 mmHg, the estimated right ventricular systolic pressure is 35.3 mmHg. Left Atrium: Left atrial size was severely dilated. Right Atrium: Right atrial size was mildly dilated. Pericardium: There is no evidence of pericardial effusion. Mitral Valve: The mitral valve is abnormal. Moderate to severe mitral valve regurgitation. MV peak gradient, 11.0 mmHg. The mean mitral valve gradient is 6.0 mmHg. Tricuspid Valve: The tricuspid valve is normal in structure. Tricuspid valve regurgitation is moderate. Aortic Valve: The aortic valve is tricuspid. Aortic valve regurgitation is moderate. Aortic regurgitation PHT measures 278 msec. Mild to moderate aortic valve sclerosis/calcification is present, without any  evidence of aortic stenosis. Aortic valve mean gradient measures 5.0 mmHg. Aortic valve peak gradient measures 10.3 mmHg. Aortic valve area, by VTI measures 1.32 cm. Pulmonic Valve: The pulmonic valve was not well visualized. Pulmonic valve regurgitation is not visualized. Aorta: The aortic root and ascending aorta are structurally normal, with no evidence of dilitation. Venous: The inferior vena cava is normal in size with less than 50% respiratory variability, suggesting right atrial pressure of 8 mmHg. IAS/Shunts: No atrial level shunt detected by color flow Doppler. Additional Comments: There is a small pleural effusion in the left lateral  region.  LEFT VENTRICLE PLAX 2D LVIDd:         5.40 cm      Diastology LV PW:         1.10 cm      LV e' medial:    3.71 cm/s LV IVS:        1.20 cm      LV E/e' medial:  46.6 LVOT diam:     1.90 cm      LV e' lateral:   5.57 cm/s LV SV:         31           LV E/e' lateral: 31.1 LV SV Index:   20 LVOT Area:     2.84 cm  LV Volumes (MOD) LV vol d, MOD A2C: 115.0 ml LV vol d, MOD A4C: 118.0 ml LV vol s, MOD A2C: 82.3 ml LV vol s, MOD A4C: 77.1 ml LV SV MOD A2C:     32.7 ml LV SV MOD A4C:     118.0 ml LV SV MOD BP:      37.8 ml RIGHT VENTRICLE RV S prime:     15.40 cm/s TAPSE (M-mode): 2.2 cm LEFT ATRIUM              Index       RIGHT ATRIUM           Index LA diam:        3.70 cm  2.38 cm/m  RA Area:     17.00 cm LA Vol (A2C):   96.2 ml  61.97 ml/m RA Volume:   45.90 ml  29.57 ml/m LA Vol (A4C):   111.0 ml 71.51 ml/m LA Biplane Vol: 109.0 ml 70.22 ml/m  AORTIC VALVE                    PULMONIC VALVE AV Area (Vmax):    1.54 cm     PV Vmax:       0.53 m/s AV Area (Vmean):   1.49 cm     PV Vmean:      47.800 cm/s AV Area (VTI):     1.32 cm     PV VTI:        0.108 m AV Vmax:           160.50 cm/s  PV Peak grad:  1.1 mmHg AV Vmean:          106.450 cm/s PV Mean grad:  1.0 mmHg AV VTI:            0.238 m AV Peak Grad:      10.3 mmHg AV Mean Grad:      5.0 mmHg LVOT Vmax:          87.30 cm/s LVOT Vmean:        56.100 cm/s LVOT VTI:          0.111 m LVOT/AV VTI ratio: 0.47 AI PHT:            278 msec AR Vena Contracta: 0.20 cm  AORTA Ao Root diam: 3.10 cm Ao Asc diam:  3.40 cm MITRAL VALVE                 TRICUSPID VALVE MV Area (PHT): 6.54 cm      TR Peak grad:   28.3 mmHg MV Area VTI:   0.89 cm      TR Vmax:        266.00 cm/s MV Peak  grad:  11.0 mmHg MV Mean grad:  6.0 mmHg      SHUNTS MV Vmax:       1.66 m/s      Systemic VTI:  0.11 m MV Vmean:      113.0 cm/s    Systemic Diam: 1.90 cm MV Decel Time: 116 msec MR Peak grad:    124.5 mmHg MR Mean grad:    73.0 mmHg MR Vmax:         558.00 cm/s MR Vmean:        393.0 cm/s MR PISA:         2.26 cm MR PISA Eff ROA: 10 mm MR PISA Radius:  0.60 cm MV E velocity: 173.00 cm/s MV A velocity: 124.00 cm/s MV E/A ratio:  1.40 Oswaldo Milian MD Electronically signed by Oswaldo Milian MD Signature Date/Time: 01/16/2021/2:30:10 PM    Final (Updated)       DVT prophylaxis: Heparin  Code Status: Full code  Family Communication: Discussed with patient's husband at bedside   Consultants:  Nephrology  Procedures:      Objective    Physical Examination:    General-appears in no acute distress  Heart-S1-S2, regular, no murmur auscultated  Lungs-clear to auscultation bilaterally, no wheezing or crackles auscultated  Abdomen-soft, nontender, no organomegaly  Extremities-no edema in the lower extremities  Neuro-alert, oriented x3, no focal deficit noted   Status is: Inpatient  Dispo: The patient is from: Home              Anticipated d/c is to: Home              Anticipated d/c date is: 01/17/2021              Patient currently not stable for discharge  Barrier to discharge-ongoing treatment for new onset CHF  COVID-19 Labs  No results for input(s): DDIMER, FERRITIN, LDH, CRP in the last 72 hours.  Lab Results  Component Value Date   [place] NEGATIVE 01/15/2021   [place] NEGATIVE  07/20/2020   [place] NEGATIVE 05/15/2020   [place] NEGATIVE 04/16/2020    Microbiology  Recent Results (from the past 240 hour(s))  Resp Panel by RT-PCR (Flu A&B, Covid) Nasopharyngeal Swab     Status: None   Collection Time: 01/15/21 11:39 AM   Specimen: Nasopharyngeal Swab; Nasopharyngeal(NP) swabs in vial transport medium  Result Value Ref Range Status   SARS Coronavirus 2 by RT PCR NEGATIVE NEGATIVE Final    Comment: (NOTE) SARS-CoV-2 target nucleic acids are NOT DETECTED.  The SARS-CoV-2 RNA is generally detectable in upper respiratory specimens during the acute phase of infection. The lowest concentration of SARS-CoV-2 viral copies this assay can detect is 138 copies/mL. A negative result does not preclude SARS-Cov-2 infection and should not be used as the sole basis for treatment or other patient management decisions. A negative result may occur with  improper specimen collection/handling, submission of specimen other than nasopharyngeal swab, presence of viral mutation(s) within the areas targeted by this assay, and inadequate number of viral copies(<138 copies/mL). A negative result must be combined with clinical observations, patient history, and epidemiological information. The expected result is Negative.  Fact Sheet for Patients:  EntrepreneurPulse.com.au  Fact Sheet for Healthcare Providers:  IncredibleEmployment.be  This test is no t yet approved or cleared by the [place] FDA and  has been authorized for detection and/or diagnosis of SARS-CoV-2 by FDA under an Emergency Use Authorization (EUA). This EUA will remain  in effect (meaning  this test can be used) for the duration of the COVID-19 declaration under Section 564(b)(1) of the Act, 21 U.S.C.section 360bbb-3(b)(1), unless the authorization is terminated  or revoked sooner.       Influenza A by PCR NEGATIVE NEGATIVE Final   Influenza B by PCR NEGATIVE  NEGATIVE Final    Comment: (NOTE) The Xpert Xpress SARS-CoV-2/FLU/RSV plus assay is intended as an aid in the diagnosis of influenza from Nasopharyngeal swab specimens and should not be used as a sole basis for treatment. Nasal washings and aspirates are unacceptable for Xpert Xpress SARS-CoV-2/FLU/RSV testing.  Fact Sheet for Patients: EntrepreneurPulse.com.au  Fact Sheet for Healthcare Providers: IncredibleEmployment.be  This test is not yet approved or cleared by the [place] FDA and has been authorized for detection and/or diagnosis of SARS-CoV-2 by FDA under an Emergency Use Authorization (EUA). This EUA will remain in effect (meaning this test can be used) for the duration of the COVID-19 declaration under Section 564(b)(1) of the Act, 21 U.S.C. section 360bbb-3(b)(1), unless the authorization is terminated or revoked.  Performed at [facility], [address] [place], [place] [address]              [facility] If 7PM-7AM, please contact night-coverage at [email or web address], Office  [phone]   01/16/2021, 6:09 PM  LOS: 1 day

## 2021-01-16 NOTE — H&P (View-Only) (Signed)
Cardiology Consultation:   Patient ID: ROSEMARIA INABINET MRN: [id number]; DOB: 23-Apr-1946  Admit date: 01/15/2021 Date of Consult: 01/16/2021  PCP:  Leeroy Cha, [place]  Cardiologist:  No primary care provider on file.  Advanced Practice Provider:  No care team member to display Electrophysiologist:  None        Patient Profile:   Jasmine White is a 75 y.o. female with a hx of ESRD who is being seen today for the evaluation of CHF at the request of Dr Darrick Meigs.  History of Present Illness:   Jasmine White is a 75 year old woman admitted with new onset congestive heart failure.  She has an interesting history.  She reports that she was very healthy until 2020 when she developed COVID-19 pneumonia.  She used to walk 5 to 6 miles every day and work out at [employer].  She developed multiple problems including ANCA positive interstitial lung disease and end-stage renal disease.  She reports that she has been on hemodialysis now for approximately 6 months.  The patient describes progressive fatigue.  She has developed shortness of breath at rest, orthopnea, and PND over the last 4 to 6 weeks.  She frequently has to sit up on the bedside at night and lean forward in order to breathe well.  She feels a little bit better since undergoing back-to-back dialysis the past few days.  She had developed ankle swelling and this is now resolved.  She continues to experience orthopnea and PND with slight improvement.  She has felt a pressure in her chest at times, but no exertional angina.  The patient is a lifelong non-smoker.  During her hospitalization, she is found to have markedly elevated BNP.  She has small bilateral pleural effusions with mild interstitial edema.  An echocardiogram was performed today and demonstrates new LV dysfunction with an LVEF of 30 to 35%, moderately severe mitral regurgitation, and moderate aortic insufficiency.   Past Medical History:   Diagnosis Date  . Arthritis   . Bruises easily   . Cancer ([place])    basal skin cancer  . Chronic back pain    HNP/stenosis and radiculopathy  . Hyperlipidemia    takes Pravastatin daily  . Hypertension    takes Hyzaar and toprol daily  . Hypothyroidism    takes Synthroid daily  . Insomnia    takes Elevil nightly  . Joint pain   . Joint swelling   . Low BP    past some sedation  . Nocturia   . PONV (postoperative nausea and vomiting)    with knee replacement b/p dropped  . Sinusitis    finished zpak yesterday  . Urinary frequency     Past Surgical History:  Procedure Laterality Date  . A/V FISTULAGRAM N/A 05/17/2020   Procedure: A/V FISTULAGRAM - Left Arm;  Surgeon: Marty Heck, MD;  Location: [facility];  Service: Cardiovascular;  Laterality: N/A;  . ABDOMINAL HYSTERECTOMY    . AV FISTULA PLACEMENT Left 04/02/2020   Procedure: ARTERIOVENOUS (AV) FISTULA CREATION;  Surgeon: Rosetta Posner, MD;  Location: [facility];  Service: Vascular;  Laterality: Left;  . BACK SURGERY    . basal cell skin cancer     on face and forehead  . bil knee replacements    . BREAST BIOPSY     left breast/benign  . COLONOSCOPY    . IR FLUORO GUIDE CV LINE RIGHT  03/20/2020  . IR  US GUIDE VASC ACCESS RIGHT  03/20/2020  . LUMBAR LAMINECTOMY/DECOMPRESSION MICRODISCECTOMY  08/13/2012   Procedure: LUMBAR LAMINECTOMY/DECOMPRESSION MICRODISCECTOMY 1 LEVEL;  Surgeon: Erline Levine, MD;  Location: [facility];  Service: Neurosurgery;  Laterality: Left;  Left lumbar one-two Microdiskectomy  . PERIPHERAL VASCULAR BALLOON ANGIOPLASTY Left 05/17/2020   Procedure: PERIPHERAL VASCULAR BALLOON ANGIOPLASTY;  Surgeon: Marty Heck, MD;  Location: [facility];  Service: Cardiovascular;  Laterality: Left;  Arm fistula      Home Medications:  Prior to Admission medications   Medication Sig Start Date End Date Taking? Authorizing Provider  acetaminophen (TYLENOL) 500 MG tablet Take  500-1,000 mg by mouth daily as needed (for pain).   Yes [provider]  albuterol (VENTOLIN HFA) 108 (90 Base) MCG/ACT inhaler Inhale 2 puffs into the lungs every 6 (six) hours as needed for wheezing or shortness of breath. 12/19/20  Yes Chesley Mires, MD  b complex-vitamin c-folic acid (NEPHRO-VITE) 0.8 MG TABS tablet Take 1 tablet by mouth at bedtime.   Yes [provider]  escitalopram (LEXAPRO) 10 MG tablet Take 10 mg by mouth daily in the afternoon.   Yes [provider]  HYDROcodone-acetaminophen (NORCO/VICODIN) 5-325 MG tablet Take 0.5 tablets by mouth at bedtime as needed (for pain).   Yes [provider]  hydrocortisone 1 % lotion Apply 1 application topically 2 (two) times daily as needed for itching.   Yes [provider]  sevelamer carbonate (RENVELA) 800 MG tablet Take 800-3,600 mg by mouth See admin instructions. Take 3,600 mg by mouth with each meal and 800 mg with each snack   Yes [provider]  SYNTHROID 88 MCG tablet TAKE 1 TABLET BY [place] BREAKFAST Patient taking differently: Take 88 mcg by mouth daily before breakfast. 03/01/20  Yes Chevis Pretty, FNP    Inpatient Medications: Scheduled Meds: . (feeding supplement) PROSource Plus  30 mL Oral BID BM  . carvedilol  3.125 mg Oral BID WC  . Chlorhexidine Gluconate Cloth  6 each Topical Q0600  . Chlorhexidine Gluconate Cloth  6 each Topical Q0600  . escitalopram  10 mg Oral Daily  . feeding supplement (NEPRO CARB STEADY)  237 mL Oral BID BM  . heparin  5,000 Units Subcutaneous Q12H  . levothyroxine  88 mcg Oral QAC breakfast  . multivitamin  1 tablet Oral QHS  . sevelamer carbonate  3,600 mg Oral TID WC  . sodium chloride flush  3 mL Intravenous Q12H   Continuous Infusions: . sodium chloride     PRN Meds: sodium chloride, acetaminophen, albuterol, diphenhydrAMINE, HYDROcodone-acetaminophen, ondansetron (ZOFRAN) IV, sevelamer carbonate, sodium  chloride flush  Allergies:    Allergies  Allergen Reactions  . Hctz [Hydrochlorothiazide] Other (See Comments)    Hx of severe Hyponatremia while taking HCTZ - now contraindicated  . Other Other (See Comments)    If ever given "strong" pain medication, the patient will require something to counteract the nausea  . Percocet [Oxycodone-Acetaminophen] Nausea Only  . Ultram [Tramadol] Nausea Only and Other (See Comments)    Patient remarked this made her "feel crazy" (also)    Social History:   Social History   Socioeconomic History  . Marital status: Married    Spouse name: Ray  . Number of children: 2  . Years of education: Not on file  . Highest education level: Not on file  Occupational History  . Occupation: retired  Tobacco Use  . Smoking status: Never Smoker  . Smokeless  tobacco: Never Used  Vaping Use  . Vaping Use: Never used  Substance and Sexual Activity  . Alcohol use: No    Alcohol/week: 0.0 standard drinks  . Drug use: No  . Sexual activity: Not Currently  Other Topics Concern  . Not on file  Social History Narrative  . Not on file   Social Determinants of Health   Financial Resource Strain: Not on file  Food Insecurity: Not on file  Transportation Needs: Not on file  Physical Activity: Not on file  Stress: Not on file  Social Connections: Not on file  Intimate Partner Violence: Not on file    Family History:    Family History  Problem Relation Age of Onset  . Hypertension Mother   . Heart disease Mother   . Stroke Father   . Heart disease Father   . Alcohol abuse Father   . Arthritis Brother   . Cancer Brother 76       prostate  . Arthritis Brother   . Colon cancer Neg Hx   . Colon polyps Neg Hx   . Stomach cancer Neg Hx   . Rectal cancer Neg Hx      ROS:  Please see the history of present illness.  All other ROS reviewed and negative.     Physical Exam/Data:   Vitals:   01/15/21 2026 01/16/21 0238 01/16/21 0430 01/16/21 0432   BP: 121/78  137/85 137/85  Pulse: 98  100 93  Resp: 20  19 20   Temp: 98.5 F (36.9 C)  98.2 F (36.8 C) 98.5 F (36.9 C)  TempSrc:   Oral Oral  SpO2:  97% 97% 100%  Weight:   50 kg   Height:        Intake/Output Summary (Last 24 hours) at 01/16/2021 1853 Last data filed at 01/16/2021 1400 Gross per 24 hour  Intake 725 ml  Output 0 ml  Net 725 ml   Last 3 Weights 01/16/2021 01/15/2021 01/15/2021  Weight (lbs) 110 lb 3.2 oz 239 lb 10.2 oz 108 lb 7.5 oz  Weight (kg) 49.986 kg 108.7 kg 49.2 kg     Body mass index is 17.79 kg/m.  General: Thin woman in no acute distress HEENT: normal Lymph: no adenopathy Neck: no JVD Endocrine:  No thryomegaly Vascular: No carotid bruits; Cardiac:  normal S1, S2; RRR; 3/6 holosystolic murmur at the left axilla Lungs:  clear to auscultation bilaterally, no wheezing, rhonchi or rales  Abd: soft, nontender, no hepatomegaly  Ext: no edema Musculoskeletal:  No deformities, BUE and BLE strength normal and equal Skin: warm and dry  Neuro:  CNs 2-12 intact, no focal abnormalities noted Psych:  Normal affect   EKG:  The EKG was personally reviewed and demonstrates: Sinus tachycardia 105 bpm, LVH, wandering baseline  Relevant CV Studies: 2D echocardiogram: IMPRESSIONS    1. Left ventricular ejection fraction, by estimation, is 30 to 35%. The  left ventricle has moderately decreased function. The left ventricle  demonstrates global hypokinesis. The left ventricular internal cavity size  was mildly dilated. There is mild  left ventricular hypertrophy. Left ventricular diastolic parameters are  consistent with Grade II diastolic dysfunction (pseudonormalization).  Elevated left atrial pressure.  2. Right ventricular systolic function is normal. The right ventricular  size is normal. There is mildly elevated pulmonary artery systolic  pressure. The estimated right ventricular systolic pressure is 38.2 mmHg.  3. Left atrial size was severely  dilated.  4. Right atrial size was mildly  dilated.  5. The mitral valve is abnormal. Moderate to severe mitral valve  regurgitation.  6. Tricuspid valve regurgitation is moderate.  7. The aortic valve is tricuspid. Aortic valve regurgitation is moderate.  Mild to moderate aortic valve sclerosis/calcification is present, without  any evidence of aortic stenosis.  8. The inferior vena cava is normal in size with <50% respiratory  variability, suggesting right atrial pressure of 8 mmHg.   Laboratory Data:  High Sensitivity Troponin:   Recent Labs  Lab 01/15/21 1054 01/15/21 1254  TROPONINIHS 87* 77*     Chemistry Recent Labs  Lab 01/15/21 1054 01/16/21 0412  NA 132* 133*  K 3.6 3.5  CL 88* 93*  CO2 32 29  GLUCOSE 95 85  BUN 46* 27*  CREATININE 5.43* 3.94*  CALCIUM 10.0 9.7  GFRNONAA 8* 11*  ANIONGAP 12 11    Recent Labs  Lab 01/15/21 1054  PROT 6.7  ALBUMIN 3.8  AST 51*  ALT 25  ALKPHOS 138*  BILITOT 1.1   Hematology Recent Labs  Lab 01/15/21 1054  WBC 5.7  RBC 3.90  HGB 11.7*  HCT 37.3  MCV 95.6  MCH 30.0  MCHC 31.4  RDW 17.1*  PLT 163   BNPNo results for input(s): BNP, PROBNP in the last 168 hours.  DDimer No results for input(s): DDIMER in the last 168 hours.   Radiology/Studies:  DG Chest 1 View  Result Date: 01/16/2021 CLINICAL DATA:  Congestive heart failure EXAM: CHEST  1 VIEW COMPARISON:  01/15/2021 FINDINGS: Pulmonary insufflation is stable. Diffuse interstitial prominence is again identified, slightly improved from a prior examination and likely reflecting trace, improving interstitial pulmonary edema. Small bilateral pleural effusions are unchanged. Superimposed right basilar consolidation is stable. Cardiac size within normal limits. No pneumothorax. IMPRESSION: Interval improvement in probable trace interstitial pulmonary edema. Stable small bilateral pleural effusions. Stable superimposed right basilar consolidation possibly related  to superimposed acute infection or aspiration. Electronically Signed   By: Fidela Salisbury MD   On: 01/16/2021 07:20   DG Chest 2 View  Result Date: 01/15/2021 CLINICAL DATA:  Shortness of breath. EXAM: CHEST - 2 VIEW COMPARISON:  CT 12/17/2020.  Chest x-ray 12/12/2020, 04/18/2020. FINDINGS: Mediastinum and hilar structures normal. Stable cardiomegaly. Diffuse bilateral interstitial prominence again noted. Similar findings noted on prior exams. Reference is made to CT report of 12/17/2020. Small bilateral pleural effusions noted. No pneumothorax. IMPRESSION: 1.  Stable cardiomegaly. 2. Diffuse bilateral interstitial prominence again noted. Similar findings noted on prior exam. Reference is made to prior CT report of 12/17/2020. Small bilateral pleural effusions again noted. Electronically Signed   By: Marcello Moores  Register   On: 01/15/2021 11:20   ECHOCARDIOGRAM COMPLETE  Result Date: 01/16/2021    ECHOCARDIOGRAM REPORT   Patient Name:   Jasmine White Date of Exam: 01/16/2021 Medical Rec #:  [id number]       Height:       66.0 in Accession #:    [id number]      Weight:       110.2 lb Date of Birth:  [birth year]       BSA:          1.552 m Patient Age:    8 years        BP:           137/85 mmHg Patient Gender: F               HR:  90 bpm. Exam Location:  Inpatient Procedure: 2D Echo, Cardiac Doppler, Color Doppler and Strain Analysis  Results communicated to Dr Darrick Meigs at 1435. Indications:     CHF  History:         Patient has prior history of Echocardiogram examinations, most                  recent 04/06/2020. Signs/Symptoms:Hypotension; Risk                  Factors:Hypertension and Dyslipidemia.  Sonographer:     El Combate Referring Phys:  [id number] Lequita Halt Diagnosing Phys: Oswaldo Milian MD IMPRESSIONS  1. Left ventricular ejection fraction, by estimation, is 30 to 35%. The left ventricle has moderately decreased function. The left ventricle demonstrates global hypokinesis. The left  ventricular internal cavity size was mildly dilated. There is mild left ventricular hypertrophy. Left ventricular diastolic parameters are consistent with Grade II diastolic dysfunction (pseudonormalization). Elevated left atrial pressure.  2. Right ventricular systolic function is normal. The right ventricular size is normal. There is mildly elevated pulmonary artery systolic pressure. The estimated right ventricular systolic pressure is 86.7 mmHg.  3. Left atrial size was severely dilated.  4. Right atrial size was mildly dilated.  5. The mitral valve is abnormal. Moderate to severe mitral valve regurgitation.  6. Tricuspid valve regurgitation is moderate.  7. The aortic valve is tricuspid. Aortic valve regurgitation is moderate. Mild to moderate aortic valve sclerosis/calcification is present, without any evidence of aortic stenosis.  8. The inferior vena cava is normal in size with <50% respiratory variability, suggesting right atrial pressure of 8 mmHg. FINDINGS  Left Ventricle: Left ventricular ejection fraction, by estimation, is 30 to 35%. The left ventricle has moderately decreased function. The left ventricle demonstrates global hypokinesis. The left ventricular internal cavity size was mildly dilated. There is mild left ventricular hypertrophy. Left ventricular diastolic parameters are consistent with Grade II diastolic dysfunction (pseudonormalization). Elevated left atrial pressure. Right Ventricle: The right ventricular size is normal. No increase in right ventricular wall thickness. Right ventricular systolic function is normal. There is mildly elevated pulmonary artery systolic pressure. The tricuspid regurgitant velocity is 2.66  m/s, and with an assumed right atrial pressure of 8 mmHg, the estimated right ventricular systolic pressure is 61.9 mmHg. Left Atrium: Left atrial size was severely dilated. Right Atrium: Right atrial size was mildly dilated. Pericardium: There is no evidence of pericardial  effusion. Mitral Valve: The mitral valve is abnormal. Moderate to severe mitral valve regurgitation. MV peak gradient, 11.0 mmHg. The mean mitral valve gradient is 6.0 mmHg. Tricuspid Valve: The tricuspid valve is normal in structure. Tricuspid valve regurgitation is moderate. Aortic Valve: The aortic valve is tricuspid. Aortic valve regurgitation is moderate. Aortic regurgitation PHT measures 278 msec. Mild to moderate aortic valve sclerosis/calcification is present, without any evidence of aortic stenosis. Aortic valve mean gradient measures 5.0 mmHg. Aortic valve peak gradient measures 10.3 mmHg. Aortic valve area, by VTI measures 1.32 cm. Pulmonic Valve: The pulmonic valve was not well visualized. Pulmonic valve regurgitation is not visualized. Aorta: The aortic root and ascending aorta are structurally normal, with no evidence of dilitation. Venous: The inferior vena cava is normal in size with less than 50% respiratory variability, suggesting right atrial pressure of 8 mmHg. IAS/Shunts: No atrial level shunt detected by color flow Doppler. Additional Comments: There is a small pleural effusion in the left lateral region.  LEFT VENTRICLE PLAX 2D LVIDd:  5.40 cm      Diastology LV PW:         1.10 cm      LV e' medial:    3.71 cm/s LV IVS:        1.20 cm      LV E/e' medial:  46.6 LVOT diam:     1.90 cm      LV e' lateral:   5.57 cm/s LV SV:         31           LV E/e' lateral: 31.1 LV SV Index:   20 LVOT Area:     2.84 cm  LV Volumes (MOD) LV vol d, MOD A2C: 115.0 ml LV vol d, MOD A4C: 118.0 ml LV vol s, MOD A2C: 82.3 ml LV vol s, MOD A4C: 77.1 ml LV SV MOD A2C:     32.7 ml LV SV MOD A4C:     118.0 ml LV SV MOD BP:      37.8 ml RIGHT VENTRICLE RV S prime:     15.40 cm/s TAPSE (M-mode): 2.2 cm LEFT ATRIUM              Index       RIGHT ATRIUM           Index LA diam:        3.70 cm  2.38 cm/m  RA Area:     17.00 cm LA Vol (A2C):   96.2 ml  61.97 ml/m RA Volume:   45.90 ml  29.57 ml/m LA Vol (A4C):    111.0 ml 71.51 ml/m LA Biplane Vol: 109.0 ml 70.22 ml/m  AORTIC VALVE                    PULMONIC VALVE AV Area (Vmax):    1.54 cm     PV Vmax:       0.53 m/s AV Area (Vmean):   1.49 cm     PV Vmean:      47.800 cm/s AV Area (VTI):     1.32 cm     PV VTI:        0.108 m AV Vmax:           160.50 cm/s  PV Peak grad:  1.1 mmHg AV Vmean:          106.450 cm/s PV Mean grad:  1.0 mmHg AV VTI:            0.238 m AV Peak Grad:      10.3 mmHg AV Mean Grad:      5.0 mmHg LVOT Vmax:         87.30 cm/s LVOT Vmean:        56.100 cm/s LVOT VTI:          0.111 m LVOT/AV VTI ratio: 0.47 AI PHT:            278 msec AR Vena Contracta: 0.20 cm  AORTA Ao Root diam: 3.10 cm Ao Asc diam:  3.40 cm MITRAL VALVE                 TRICUSPID VALVE MV Area (PHT): 6.54 cm      TR Peak grad:   28.3 mmHg MV Area VTI:   0.89 cm      TR Vmax:        266.00 cm/s MV Peak grad:  11.0 mmHg MV Mean grad:  6.0 mmHg  SHUNTS MV Vmax:       1.66 m/s      Systemic VTI:  0.11 m MV Vmean:      113.0 cm/s    Systemic Diam: 1.90 cm MV Decel Time: 116 msec MR Peak grad:    124.5 mmHg MR Mean grad:    73.0 mmHg MR Vmax:         558.00 cm/s MR Vmean:        393.0 cm/s MR PISA:         2.26 cm MR PISA Eff ROA: 10 mm MR PISA Radius:  0.60 cm MV E velocity: 173.00 cm/s MV A velocity: 124.00 cm/s MV E/A ratio:  1.40 Oswaldo Milian MD Electronically signed by Oswaldo Milian MD Signature Date/Time: 01/16/2021/2:30:10 PM    Final (Updated)      Assessment and Plan:   1. Acute systolic heart failure with newly diagnosed severe LV systolic dysfunction 2. Valvular heart disease with moderately severe mitral and aortic regurgitation 3. End-stage renal disease  The patient has classic symptoms of progressive heart failure over the past 4 to 6 weeks with symptoms of leg edema, progressive dyspnea, weakness and fatigue, orthopnea, and PND.  She has had interval decline in LV function with normal LVEF in July 2021 at the time of her last  echocardiogram and now with severe LV dysfunction on her recent echo study.  In general it appears that the patient has really struggled since the COVID infection in 2020 and now with progression to end-stage renal disease requiring hemodialysis over the last 6 months.  I reviewed potential etiologies of systolic heart failure/LV dysfunction with the patient today.  It is possible that she has a valvular cardiomyopathy or an ischemic cardiomyopathy.  I have recommended that she undergo right and left heart catheterization for definitive evaluation of both her coronary anatomy and hemodynamics.  She will require femoral access because of hemodialysis. I have reviewed the risks, indications, and alternatives to cardiac catheterization, possible angioplasty, and stenting with the patient. Risks include but are not limited to bleeding, infection, vascular injury, stroke, myocardial infection, arrhythmia, kidney injury, radiation-related injury in the case of prolonged fluoroscopy use, emergency cardiac surgery, and death. The patient understands the risks of serious complication is 1-2 in 6606 with diagnostic cardiac cath and 1-2% or less with angioplasty/stenting.  Medical therapy is limited in this patient currently undergoing daily dialysis to remove volume.  Would continue carvedilol for now.  Further recommendations based on results of her heart catheterization.  We may ultimately need to perform a transesophageal echo for further assessment of her valvular dysfunction.  I am concerned that her treatment options will be limited as she will probably not be a candidate for transcatheter edge-to-edge mitral valve repair because of her thickened leaflets and resting transmitral mean gradient of 6 mmHg.  We will follow up tomorrow after her heart catheterization.  Full informed consent is obtained tonight.  I spoke with the patient's nurse and there are plans to perform hemodialysis later tonight.  The patient thinks  she will be able to lie flat for the cath procedure as long as she has supplemental oxygen.   Risk Assessment/Risk Scores:        New York Heart Association (NYHA) Functional Class NYHA Class IV        For questions or updates, please contact [facility] Please consult [email or web address] for contact info under    Signed, Sherren Mocha, MD  01/16/2021 6:53 PM

## 2021-01-16 NOTE — Progress Notes (Signed)
*  PRELIMINARY RESULTS* Echocardiogram 2D Echocardiogram has been performed.  Jasmine White 01/16/2021, 12:54 PM

## 2021-01-17 ENCOUNTER — Encounter (HOSPITAL_COMMUNITY): Payer: Self-pay | Admitting: Cardiovascular Disease

## 2021-01-17 ENCOUNTER — Inpatient Hospital Stay (HOSPITAL_COMMUNITY)
Admission: EM | Disposition: A | Discharge status: Home Health | Payer: Self-pay | Source: Home / Self Care | Attending: Family Medicine

## 2021-01-17 DIAGNOSIS — I5023 Acute on chronic systolic (congestive) heart failure: Secondary | ICD-10-CM

## 2021-01-17 HISTORY — PX: RIGHT/LEFT HEART CATH AND CORONARY ANGIOGRAPHY: CATH118266

## 2021-01-17 LAB — BASIC METABOLIC PANEL
Anion gap: 10 (ref 5–15)
BUN: 12 mg/dL (ref 8–23)
CO2: 30 mmol/L (ref 22–32)
Calcium: 9.5 mg/dL (ref 8.9–10.3)
Chloride: 96 mmol/L — ABNORMAL LOW (ref 98–111)
Creatinine, Ser: 2.55 mg/dL — ABNORMAL HIGH (ref 0.44–1.00)
GFR, Estimated: 19 mL/min — ABNORMAL LOW (ref >60)
Glucose, Bld: 91 mg/dL (ref 70–99)
Potassium: 3.5 mmol/L (ref 3.5–5.1)
Sodium: 136 mmol/L (ref 135–145)

## 2021-01-17 LAB — POCT I-STAT 7, (LYTES, BLD GAS, ICA,H+H)
Acid-Base Excess: 5 mmol/L — ABNORMAL HIGH (ref 0.0–2.0)
Bicarbonate: 31.2 mmol/L — ABNORMAL HIGH (ref 20.0–28.0)
Calcium, Ion: 1.21 mmol/L (ref 1.15–1.40)
HCT: 32 % — ABNORMAL LOW (ref 36.0–46.0)
Hemoglobin: 10.9 g/dL — ABNORMAL LOW (ref 12.0–15.0)
O2 Saturation: 100 %
Potassium: 3.6 mmol/L (ref 3.5–5.1)
Sodium: 138 mmol/L (ref 135–145)
TCO2: 33 mmol/L — ABNORMAL HIGH (ref 22–32)
pCO2 arterial: 56.3 mmHg — ABNORMAL HIGH (ref 32.0–48.0)
pH, Arterial: 7.352 (ref 7.350–7.450)
pO2, Arterial: 232 mmHg — ABNORMAL HIGH (ref 83.0–108.0)

## 2021-01-17 LAB — LIPID PANEL
Cholesterol: 130 mg/dL (ref 0–200)
HDL: 60 mg/dL (ref >40)
LDL Cholesterol: 53 mg/dL (ref 0–99)
Total CHOL/HDL Ratio: 2.2 RATIO
Triglycerides: 84 mg/dL (ref <150)
VLDL: 17 mg/dL (ref 0–40)

## 2021-01-17 LAB — POCT I-STAT EG7
Acid-Base Excess: 4 mmol/L — ABNORMAL HIGH (ref 0.0–2.0)
Acid-Base Excess: 5 mmol/L — ABNORMAL HIGH (ref 0.0–2.0)
Bicarbonate: 30.7 mmol/L — ABNORMAL HIGH (ref 20.0–28.0)
Bicarbonate: 31.4 mmol/L — ABNORMAL HIGH (ref 20.0–28.0)
Calcium, Ion: 1.27 mmol/L (ref 1.15–1.40)
Calcium, Ion: 1.3 mmol/L (ref 1.15–1.40)
HCT: 32 % — ABNORMAL LOW (ref 36.0–46.0)
HCT: 32 % — ABNORMAL LOW (ref 36.0–46.0)
Hemoglobin: 10.9 g/dL — ABNORMAL LOW (ref 12.0–15.0)
Hemoglobin: 10.9 g/dL — ABNORMAL LOW (ref 12.0–15.0)
O2 Saturation: 71 %
O2 Saturation: 73 %
Potassium: 3.6 mmol/L (ref 3.5–5.1)
Potassium: 3.7 mmol/L (ref 3.5–5.1)
Sodium: 138 mmol/L (ref 135–145)
Sodium: 138 mmol/L (ref 135–145)
TCO2: 32 mmol/L (ref 22–32)
TCO2: 33 mmol/L — ABNORMAL HIGH (ref 22–32)
pCO2, Ven: 56.3 mmHg (ref 44.0–60.0)
pCO2, Ven: 57.5 mmHg (ref 44.0–60.0)
pH, Ven: 7.344 (ref 7.250–7.430)
pH, Ven: 7.346 (ref 7.250–7.430)
pO2, Ven: 40 mmHg (ref 32.0–45.0)
pO2, Ven: 42 mmHg (ref 32.0–45.0)

## 2021-01-17 SURGERY — RIGHT/LEFT HEART CATH AND CORONARY ANGIOGRAPHY
Anesthesia: LOCAL

## 2021-01-17 MED ORDER — SODIUM CHLORIDE 0.9 % IV SOLN: INTRAVENOUS | Status: DC

## 2021-01-17 MED ORDER — CARVEDILOL 3.125 MG PO TABS: 3.1250 mg | ORAL_TABLET | Freq: Two times a day (BID) | ORAL | 3 refills | Status: DC

## 2021-01-17 MED ORDER — VERAPAMIL HCL 2.5 MG/ML IV SOLN
INTRAVENOUS | Status: AC
  Filled 2021-01-17: qty 2

## 2021-01-17 MED ORDER — MIDAZOLAM HCL 2 MG/2ML IJ SOLN
INTRAMUSCULAR | Status: DC | PRN
  Administered 2021-01-17: 1 mg via INTRAVENOUS

## 2021-01-17 MED ORDER — FENTANYL CITRATE (PF) 100 MCG/2ML IJ SOLN
INTRAMUSCULAR | Status: DC | PRN
  Administered 2021-01-17: 25 ug via INTRAVENOUS

## 2021-01-17 MED ORDER — ONDANSETRON HCL 4 MG/2ML IJ SOLN: 4.0000 mg | Freq: Four times a day (QID) | INTRAMUSCULAR | Status: DC | PRN

## 2021-01-17 MED ORDER — HEPARIN (PORCINE) IN NACL 1000-0.9 UT/500ML-% IV SOLN
INTRAVENOUS | Status: AC
  Filled 2021-01-17: qty 1000

## 2021-01-17 MED ORDER — ACETAMINOPHEN 325 MG PO TABS: 650.0000 mg | ORAL_TABLET | ORAL | Status: DC | PRN

## 2021-01-17 MED ORDER — ASPIRIN 81 MG PO CHEW: 81.0000 mg | CHEWABLE_TABLET | ORAL | Status: DC

## 2021-01-17 MED ORDER — HYDRALAZINE HCL 20 MG/ML IJ SOLN: 10.0000 mg | INTRAMUSCULAR | Status: AC | PRN

## 2021-01-17 MED ORDER — SODIUM CHLORIDE 0.9 % IV SOLN: 250.0000 mL | INTRAVENOUS | Status: DC | PRN

## 2021-01-17 MED ORDER — LIDOCAINE HCL (PF) 1 % IJ SOLN
INTRAMUSCULAR | Status: DC | PRN
  Administered 2021-01-17: 15 mL

## 2021-01-17 MED ORDER — ASPIRIN 81 MG PO CHEW
81.0000 mg | CHEWABLE_TABLET | ORAL | Status: AC
  Administered 2021-01-17: 81 mg via ORAL
  Filled 2021-01-17: qty 1

## 2021-01-17 MED ORDER — MIDAZOLAM HCL 2 MG/2ML IJ SOLN
INTRAMUSCULAR | Status: AC
  Filled 2021-01-17: qty 2

## 2021-01-17 MED ORDER — HEPARIN (PORCINE) IN NACL 1000-0.9 UT/500ML-% IV SOLN
INTRAVENOUS | Status: DC | PRN
  Administered 2021-01-17 (×2): 500 mL

## 2021-01-17 MED ORDER — SODIUM CHLORIDE 0.9% FLUSH: 3.0000 mL | INTRAVENOUS | Status: DC | PRN

## 2021-01-17 MED ORDER — SODIUM CHLORIDE 0.9% FLUSH: 3.0000 mL | Freq: Two times a day (BID) | INTRAVENOUS | Status: DC

## 2021-01-17 MED ORDER — LABETALOL HCL 5 MG/ML IV SOLN: 10.0000 mg | INTRAVENOUS | Status: AC | PRN

## 2021-01-17 MED ORDER — TRAZODONE HCL 50 MG PO TABS: 50.0000 mg | ORAL_TABLET | Freq: Every evening | ORAL | 1 refills | Status: DC | PRN

## 2021-01-17 MED ORDER — IOHEXOL 350 MG/ML SOLN
INTRAVENOUS | Status: DC | PRN
  Administered 2021-01-17: 30 mL via INTRA_ARTERIAL

## 2021-01-17 MED ORDER — SODIUM CHLORIDE 0.9 % IV SOLN: INTRAVENOUS | Status: AC

## 2021-01-17 MED ORDER — FENTANYL CITRATE (PF) 100 MCG/2ML IJ SOLN
INTRAMUSCULAR | Status: AC
  Filled 2021-01-17: qty 2

## 2021-01-17 MED ORDER — LIDOCAINE HCL (PF) 1 % IJ SOLN
INTRAMUSCULAR | Status: AC
  Filled 2021-01-17: qty 30

## 2021-01-17 SURGICAL SUPPLY — 11 items
CATH INFINITI 5FR MULTPACK ANG (CATHETERS) ×2 IMPLANT
CATH SWAN GANZ 7F STRAIGHT (CATHETERS) ×2 IMPLANT
CLOSURE MYNX CONTROL 6F/7F (Vascular Products) ×2 IMPLANT
KIT HEART LEFT (KITS) ×2 IMPLANT
PACK CARDIAC CATHETERIZATION (CUSTOM PROCEDURE TRAY) ×2 IMPLANT
SHEATH PINNACLE 5F 10CM (SHEATH) ×2 IMPLANT
SHEATH PINNACLE 7F 10CM (SHEATH) ×2 IMPLANT
SHEATH PROBE COVER 6X72 (BAG) ×2 IMPLANT
TRANSDUCER W/STOPCOCK (MISCELLANEOUS) ×2 IMPLANT
TUBING CIL FLEX 10 FLL-RA (TUBING) ×2 IMPLANT
WIRE EMERALD 3MM-J .035X150CM (WIRE) ×2 IMPLANT

## 2021-01-17 NOTE — Progress Notes (Signed)
Progress Note  Patient Name: Jasmine White Date of Encounter: 01/17/2021  Primary Cardiologist: New to [facility]   Subjective   Feeling better.  Able to lie flat without dyspnea.  No chest pain or pressure.  Patient has returned from cardiac catheterization.  Inpatient Medications    Scheduled Meds: . (feeding supplement) PROSource Plus  30 mL Oral BID BM  . carvedilol  3.125 mg Oral BID WC  . Chlorhexidine Gluconate Cloth  6 each Topical Q0600  . Chlorhexidine Gluconate Cloth  6 each Topical Q0600  . escitalopram  10 mg Oral Daily  . feeding supplement (NEPRO CARB STEADY)  237 mL Oral BID BM  . heparin  5,000 Units Subcutaneous Q12H  . levothyroxine  88 mcg Oral QAC breakfast  . multivitamin  1 tablet Oral QHS  . sevelamer carbonate  3,600 mg Oral TID WC  . sodium chloride flush  3 mL Intravenous Q12H  . sodium chloride flush  3 mL Intravenous Q12H   Continuous Infusions: . sodium chloride    . sodium chloride    . sodium chloride 10 mL/hr at 01/17/21 0622   PRN Meds: sodium chloride, sodium chloride, acetaminophen, albuterol, HYDROcodone-acetaminophen, ondansetron (ZOFRAN) IV, sevelamer carbonate, sodium chloride flush, sodium chloride flush, traZODone   Vital Signs    Vitals:   01/17/21 0222 01/17/21 0244 01/17/21 0654 01/17/21 0904  BP: 125/81 110/69 135/82 114/75  Pulse: 94 91 98 79  Resp: (!) 28 20 (!) 25 17  Temp: 98.1 F (36.7 C) 97.6 F (36.4 C) 98 F (36.7 C) 98.1 F (36.7 C)  TempSrc: Oral Oral Oral Oral  SpO2: 100% 99% 99% 100%  Weight: 49.9 kg     Height:        Intake/Output Summary (Last 24 hours) at 01/17/2021 1005 Last data filed at 01/17/2021 0202 Gross per 24 hour  Intake 240 ml  Output 2000 ml  Net -1760 ml   Filed Weights   01/16/21 0430 01/16/21 2222 01/17/21 0222  Weight: 50 kg 51.9 kg 49.9 kg    Physical Exam   General: Thin woman, NAD Skin: Warm, dry, intact  Head: Normocephalic, atraumatic, sclera non-icteric, no  xanthomas, clear, moist mucus membranes. Neck: Negative for carotid bruits. No JVD Lungs:Clear to ausculation bilaterally. No wheezes, rales, or rhonchi. Breathing is unlabored. Cardiovascular: RRR with S1 S2.  2/6 systolic murmur at the apex.  Abdomen: Soft, non-tender, non-distended with normoactive bowel sounds. No hepatomegaly, No rebound/guarding. No obvious abdominal masses. MSK: Strength and tone appear normal for age. 5/5 in all extremities Extremities: No edema. No clubbing or cyanosis. DP/PT pulses 2+ bilaterally Neuro: Alert and oriented. No focal deficits. No facial asymmetry. MAE spontaneously. Psych: Responds to questions appropriately with normal affect.    Labs    Chemistry Recent Labs  Lab 01/15/21 1054 01/16/21 0412 01/17/21 0254  NA 132* 133* 136  K 3.6 3.5 3.5  CL 88* 93* 96*  CO2 32 29 30  GLUCOSE 95 85 91  BUN 46* 27* 12  CREATININE 5.43* 3.94* 2.55*  CALCIUM 10.0 9.7 9.5  PROT 6.7  --   --   ALBUMIN 3.8  --   --   AST 51*  --   --   ALT 25  --   --   ALKPHOS 138*  --   --   BILITOT 1.1  --   --   GFRNONAA 8* 11* 19*  ANIONGAP 12 11 10      Hematology Recent  Labs  Lab 01/15/21 1054  WBC 5.7  RBC 3.90  HGB 11.7*  HCT 37.3  MCV 95.6  MCH 30.0  MCHC 31.4  RDW 17.1*  PLT 163    Cardiac EnzymesNo results for input(s): TROPONINI in the last 168 hours. No results for input(s): TROPIPOC in the last 168 hours.   BNPNo results for input(s): BNP, PROBNP in the last 168 hours.   DDimer No results for input(s): DDIMER in the last 168 hours.   Radiology    DG Chest 1 View  Result Date: 01/16/2021 CLINICAL DATA:  Congestive heart failure EXAM: CHEST  1 VIEW COMPARISON:  01/15/2021 FINDINGS: Pulmonary insufflation is stable. Diffuse interstitial prominence is again identified, slightly improved from a prior examination and likely reflecting trace, improving interstitial pulmonary edema. Small bilateral pleural effusions are unchanged. Superimposed  right basilar consolidation is stable. Cardiac size within normal limits. No pneumothorax. IMPRESSION: Interval improvement in probable trace interstitial pulmonary edema. Stable small bilateral pleural effusions. Stable superimposed right basilar consolidation possibly related to superimposed acute infection or aspiration. Electronically Signed   By: Fidela Salisbury MD   On: 01/16/2021 07:20   DG Chest 2 View  Result Date: 01/15/2021 CLINICAL DATA:  Shortness of breath. EXAM: CHEST - 2 VIEW COMPARISON:  CT 12/17/2020.  Chest x-ray 12/12/2020, 04/18/2020. FINDINGS: Mediastinum and hilar structures normal. Stable cardiomegaly. Diffuse bilateral interstitial prominence again noted. Similar findings noted on prior exams. Reference is made to CT report of 12/17/2020. Small bilateral pleural effusions noted. No pneumothorax. IMPRESSION: 1.  Stable cardiomegaly. 2. Diffuse bilateral interstitial prominence again noted. Similar findings noted on prior exam. Reference is made to prior CT report of 12/17/2020. Small bilateral pleural effusions again noted. Electronically Signed   By: Marcello Moores  Register   On: 01/15/2021 11:20   ECHOCARDIOGRAM COMPLETE  Result Date: 01/16/2021    ECHOCARDIOGRAM REPORT   Patient Name:   KATHLEAN CINCO Date of Exam: 01/16/2021 Medical Rec #:  [id number]       Height:       66.0 in Accession #:    [id number]      Weight:       110.2 lb Date of Birth:  10/23/1945       BSA:          1.552 m Patient Age:    70 years        BP:           137/85 mmHg Patient Gender: F               HR:           90 bpm. Exam Location:  Inpatient Procedure: 2D Echo, Cardiac Doppler, Color Doppler and Strain Analysis  Results communicated to Dr Darrick Meigs at 1435. Indications:     CHF  History:         Patient has prior history of Echocardiogram examinations, most                  recent 04/06/2020. Signs/Symptoms:Hypotension; Risk                  Factors:Hypertension and Dyslipidemia.  Sonographer:     Homer  Referring Phys:  [id number] Lequita Halt Diagnosing Phys: Oswaldo Milian MD IMPRESSIONS  1. Left ventricular ejection fraction, by estimation, is 30 to 35%. The left ventricle has moderately decreased function. The left ventricle demonstrates global hypokinesis. The left ventricular internal cavity size was mildly dilated. There is mild  left ventricular hypertrophy. Left ventricular diastolic parameters are consistent with Grade II diastolic dysfunction (pseudonormalization). Elevated left atrial pressure.  2. Right ventricular systolic function is normal. The right ventricular size is normal. There is mildly elevated pulmonary artery systolic pressure. The estimated right ventricular systolic pressure is 74.9 mmHg.  3. Left atrial size was severely dilated.  4. Right atrial size was mildly dilated.  5. The mitral valve is abnormal. Moderate to severe mitral valve regurgitation.  6. Tricuspid valve regurgitation is moderate.  7. The aortic valve is tricuspid. Aortic valve regurgitation is moderate. Mild to moderate aortic valve sclerosis/calcification is present, without any evidence of aortic stenosis.  8. The inferior vena cava is normal in size with <50% respiratory variability, suggesting right atrial pressure of 8 mmHg. FINDINGS  Left Ventricle: Left ventricular ejection fraction, by estimation, is 30 to 35%. The left ventricle has moderately decreased function. The left ventricle demonstrates global hypokinesis. The left ventricular internal cavity size was mildly dilated. There is mild left ventricular hypertrophy. Left ventricular diastolic parameters are consistent with Grade II diastolic dysfunction (pseudonormalization). Elevated left atrial pressure. Right Ventricle: The right ventricular size is normal. No increase in right ventricular wall thickness. Right ventricular systolic function is normal. There is mildly elevated pulmonary artery systolic pressure. The tricuspid regurgitant velocity is 2.66   m/s, and with an assumed right atrial pressure of 8 mmHg, the estimated right ventricular systolic pressure is 44.9 mmHg. Left Atrium: Left atrial size was severely dilated. Right Atrium: Right atrial size was mildly dilated. Pericardium: There is no evidence of pericardial effusion. Mitral Valve: The mitral valve is abnormal. Moderate to severe mitral valve regurgitation. MV peak gradient, 11.0 mmHg. The mean mitral valve gradient is 6.0 mmHg. Tricuspid Valve: The tricuspid valve is normal in structure. Tricuspid valve regurgitation is moderate. Aortic Valve: The aortic valve is tricuspid. Aortic valve regurgitation is moderate. Aortic regurgitation PHT measures 278 msec. Mild to moderate aortic valve sclerosis/calcification is present, without any evidence of aortic stenosis. Aortic valve mean gradient measures 5.0 mmHg. Aortic valve peak gradient measures 10.3 mmHg. Aortic valve area, by VTI measures 1.32 cm. Pulmonic Valve: The pulmonic valve was not well visualized. Pulmonic valve regurgitation is not visualized. Aorta: The aortic root and ascending aorta are structurally normal, with no evidence of dilitation. Venous: The inferior vena cava is normal in size with less than 50% respiratory variability, suggesting right atrial pressure of 8 mmHg. IAS/Shunts: No atrial level shunt detected by color flow Doppler. Additional Comments: There is a small pleural effusion in the left lateral region.  LEFT VENTRICLE PLAX 2D LVIDd:         5.40 cm      Diastology LV PW:         1.10 cm      LV e' medial:    3.71 cm/s LV IVS:        1.20 cm      LV E/e' medial:  46.6 LVOT diam:     1.90 cm      LV e' lateral:   5.57 cm/s LV SV:         31           LV E/e' lateral: 31.1 LV SV Index:   20 LVOT Area:     2.84 cm  LV Volumes (MOD) LV vol d, MOD A2C: 115.0 ml LV vol d, MOD A4C: 118.0 ml LV vol s, MOD A2C: 82.3 ml LV vol s, MOD A4C: 77.1 ml  LV SV MOD A2C:     32.7 ml LV SV MOD A4C:     118.0 ml LV SV MOD BP:      37.8 ml  RIGHT VENTRICLE RV S prime:     15.40 cm/s TAPSE (M-mode): 2.2 cm LEFT ATRIUM              Index       RIGHT ATRIUM           Index LA diam:        3.70 cm  2.38 cm/m  RA Area:     17.00 cm LA Vol (A2C):   96.2 ml  61.97 ml/m RA Volume:   45.90 ml  29.57 ml/m LA Vol (A4C):   111.0 ml 71.51 ml/m LA Biplane Vol: 109.0 ml 70.22 ml/m  AORTIC VALVE                    PULMONIC VALVE AV Area (Vmax):    1.54 cm     PV Vmax:       0.53 m/s AV Area (Vmean):   1.49 cm     PV Vmean:      47.800 cm/s AV Area (VTI):     1.32 cm     PV VTI:        0.108 m AV Vmax:           160.50 cm/s  PV Peak grad:  1.1 mmHg AV Vmean:          106.450 cm/s PV Mean grad:  1.0 mmHg AV VTI:            0.238 m AV Peak Grad:      10.3 mmHg AV Mean Grad:      5.0 mmHg LVOT Vmax:         87.30 cm/s LVOT Vmean:        56.100 cm/s LVOT VTI:          0.111 m LVOT/AV VTI ratio: 0.47 AI PHT:            278 msec AR Vena Contracta: 0.20 cm  AORTA Ao Root diam: 3.10 cm Ao Asc diam:  3.40 cm MITRAL VALVE                 TRICUSPID VALVE MV Area (PHT): 6.54 cm      TR Peak grad:   28.3 mmHg MV Area VTI:   0.89 cm      TR Vmax:        266.00 cm/s MV Peak grad:  11.0 mmHg MV Mean grad:  6.0 mmHg      SHUNTS MV Vmax:       1.66 m/s      Systemic VTI:  0.11 m MV Vmean:      113.0 cm/s    Systemic Diam: 1.90 cm MV Decel Time: 116 msec MR Peak grad:    124.5 mmHg MR Mean grad:    73.0 mmHg MR Vmax:         558.00 cm/s MR Vmean:        393.0 cm/s MR PISA:         2.26 cm MR PISA Eff ROA: 10 mm MR PISA Radius:  0.60 cm MV E velocity: 173.00 cm/s MV A velocity: 124.00 cm/s MV E/A ratio:  1.40 Oswaldo Milian MD Electronically signed by Oswaldo Milian MD Signature Date/Time: 01/16/2021/2:30:10 PM    Final (Updated)     ECG  No new tracing as of 01/17/21 - Personally Reviewed  Cardiac Studies   Echocardiogram 01/17/21:  1. Left ventricular ejection fraction, by estimation, is 30 to 35%. The  left ventricle has moderately decreased  function. The left ventricle  demonstrates global hypokinesis. The left ventricular internal cavity size  was mildly dilated. There is mild  left ventricular hypertrophy. Left ventricular diastolic parameters are  consistent with Grade II diastolic dysfunction (pseudonormalization).  Elevated left atrial pressure.  2. Right ventricular systolic function is normal. The right ventricular  size is normal. There is mildly elevated pulmonary artery systolic  pressure. The estimated right ventricular systolic pressure is 93.7 mmHg.  3. Left atrial size was severely dilated.  4. Right atrial size was mildly dilated.  5. The mitral valve is abnormal. Moderate to severe mitral valve  regurgitation.  6. Tricuspid valve regurgitation is moderate.  7. The aortic valve is tricuspid. Aortic valve regurgitation is moderate.  Mild to moderate aortic valve sclerosis/calcification is present, without  any evidence of aortic stenosis.  8. The inferior vena cava is normal in size with <50% respiratory  variability, suggesting right atrial pressure of 8 mmHg.   Patient Profile     75 y.o. female with a hx of ESRD who is being seen today for the evaluation of CHF at the request of Dr Darrick Meigs  Assessment & Plan    1. Acute systolic CHF with presumed ischemic versus valvular cardiomyopathy: -Echocardiogram performed 01/16/21 with an LVEF of 30 to 35%, moderately severe mitral regurgitation, and moderate aortic insufficiency. -Plan to pursue [facility] today for further coronary and valvular assessment  -Hold off on ASA, statin until post cath setting  -Will address GDMT after cath however will likely plan for Imdur and hydralazine if BP will allow   2. Moderate to severe MR/mod AR: -Per most recent echocardiogram with LVEF 30-35%, G2DD, LVH, moderate to severe mitral valve regurgitation, moderate TR, moderate AS, mild to moderate aortic valve sclerosis/calcification is present, without any evidence of aortic  stenosis.  -Moderate to severe mitral valve regurgitation. MV peak gradient, 11.0 mmHg. The mean mitral valve gradient is 6.0 mmHg. Aortic valve mean  gradient measures 5.0 mmHg. Aortic valve peak gradient measures 10.3 mmHg. Aortic valve area, by VTI measures 1.32 cm.  -There is concern that her HF symptoms may be stemming from her valvular disease rather than ischemia however will proceed with [facility] today for further evaluation   3. ESRD: -MWF schedule>>>dialyzed extra on 01/15/21 for volume overload -Nephrology following    4. 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  Patient seen, examined. Available data reviewed. Agree with findings, assessment, and plan as outlined by Kathyrn Drown, NP.  The patient is independently interviewed and examined.  My exam findings are reflected above.  The patient's husband and daughter are both at the bedside.  Cardiac catheterization reveals angiographically normal coronary arteries.  Intracardiac hemodynamics are normal with normal right atrial pressure, normal pulmonary artery pressure with a mean of 17 mmHg, wedge pressure of 9 with no significant V wave, and preserved cardiac output.  The patient now appears well compensated after undergoing daily dialysis for volume removal.  We will keep her on low-dose carvedilol and arrange outpatient cardiology  follow-up.  I would anticipate repeating an echocardiogram in about 6 months to reassess LV function.  I do not think she will tolerate more aggressive heart failure treatment with frailty and end-stage renal disease because of concern of hypotension especially on dialysis days.  Patient is stable for  hospital discharge from a cardiovascular perspective once her post-cath bedrest is up.  Sherren Mocha, M.D. 01/17/2021 12:39 PM

## 2021-01-17 NOTE — Progress Notes (Signed)
The patient returned from dialysis.  Patient is alert and oriented at this time, and vital signs are within normal limits.  Patient was transported via stretcher by the transport team.  Will continue to monitor.   Donah Driver, RN

## 2021-01-17 NOTE — Interval H&P Note (Signed)
Cath Lab Visit (complete for each Cath Lab visit)  Clinical Evaluation Leading to the Procedure:   ACS: No.  Non-ACS:    Anginal Classification: CCS I  Anti-ischemic medical therapy: Minimal Therapy (1 class of medications)  Non-Invasive Test Results: No non-invasive testing performed  Prior CABG: No previous CABG      History and Physical Interval Note:  01/17/2021 10:39 AM  Jasmine White  has presented today for surgery, with the diagnosis of NSTEMI.  The various methods of treatment have been discussed with the patient and family. After consideration of risks, benefits and other options for treatment, the patient has consented to  Procedure(s): RIGHT/LEFT HEART CATH AND CORONARY ANGIOGRAPHY (N/A) as a surgical intervention.  The patient's history has been reviewed, patient examined, no change in status, stable for surgery.  I have reviewed the patient's chart and labs.  Questions were answered to the patient's satisfaction.     Quay Burow

## 2021-01-17 NOTE — Progress Notes (Signed)
Site area: RT GRION ARTERIAL  Site Prior to Removal:  Level 0 Pressure Applied For:20MINUTES Manual:   YES Patient Status During Pull:  AWAKE Post Pull Site:  Level 0 Post Pull Instructions Given: YES Post Pull Pulses Present: RT DP PALPABLE Dressing Applied:  YES Bedrest begins @ 12;00 Comments:

## 2021-01-17 NOTE — TOC Initial Note (Signed)
Transition of Care [facility]) - Initial/Assessment Note    Patient Details  Name: Jasmine White MRN: [id number] Date of Birth: 03-09-1946  Transition of Care [facility]) CM/SW Contact:    Bethena Roys, RN Phone Number: 01/17/2021, 1:13 PM  Clinical Narrative:  Patient presented for shortness of breath-patient has a history of ESRD- HD MWF. Plan will be to return home with spouse once stable. Medicare.gov list was not provided-patient wanted to use previous agency. Patient has used [place] in the past and family is agreeable to have [place] again. Referral provided to [facility] and start of care to begin within 24-48 hours post transition home. Patient will transition home via private vehicle.              Expected Discharge Plan: [place] Barriers to Discharge: No Barriers Identified   Patient Goals and CMS Choice Patient states their goals for this hospitalization and ongoing recovery are:: to return home CMS Medicare.gov Compare Post Acute Care list provided to:: Other (Comment Required) (Patient has used [facility] in the past and will not need CMS Medicare .gov list to review.) Choice offered to / list presented to : NA  Expected Discharge Plan and Services Expected Discharge Plan: [place] In-house Referral: NA Discharge Planning Services: CM Consult Post Acute Care Choice: [place] arrangements for the past 2 months: Single Family Home                   DME Agency: NA         HH Agency: [place] Date [facility] Agency Contacted: 01/17/21 Time HH Agency Contacted: 1200 Representative spoke with at [place]: Tommi Rumps  Prior Living Arrangements/Services Living arrangements for the past 2 months: [place] with:: Self,Spouse (Has support of daughter.) Patient language and need for interpreter reviewed:: Yes        Need for Family Participation in Patient Care: Yes (Comment) Care giver support system in  place?: Yes (comment) Current home services: DME (Patient has a cane, rolling walker, wheelchair, and oxygen from [employer] Criminal Activity/Legal Involvement Pertinent to Current Situation/Hospitalization: No - Comment as needed  Activities of Daily Living Home Assistive Devices/Equipment: Eyeglasses ADL Screening (condition at time of admission) Patient's cognitive ability adequate to safely complete daily activities?: Yes Is the patient deaf or have difficulty hearing?: No Does the patient have difficulty seeing, even when wearing glasses/contacts?: No Does the patient have difficulty concentrating, remembering, or making decisions?: No Patient able to express need for assistance with ADLs?: Yes Does the patient have difficulty dressing or bathing?: No Independently performs ADLs?: Yes (appropriate for developmental age) Does the patient have difficulty walking or climbing stairs?: No Weakness of Legs: None Weakness of Arms/Hands: None  Permission Sought/Granted Permission sought to share information with : Case Manager,Facility Contact Representative,Family Supports Permission granted to share information with : Yes, Verbal Permission Granted     Permission granted to share info w AGENCY: [facility]        Emotional Assessment Appearance:: Appears stated age Attitude/Demeanor/Rapport: Engaged Affect (typically observed): Appropriate Orientation: : Oriented to Situation,Oriented to  Time,Oriented to Place Alcohol / Substance Use: Not Applicable Psych Involvement: No (comment)  Admission diagnosis:  Cardiomegaly [I51.7] Shortness of breath [R06.02] CHF (congestive heart failure) ([place]) [I50.9] Elevated troponin [R77.8] Patient Active Problem List   Diagnosis Date Noted  . Protein-calorie malnutrition, severe 01/16/2021  . Acute CHF (congestive heart failure) ([place]) 01/15/2021  . CHF (  congestive heart failure) ([place]) 01/15/2021  . Cardiomegaly   . 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 HAP (hospital-acquired pneumonia)   . Acute pulmonary edema (H[facility])   . Hypokalemia 04/16/2020  . Unspecified protein-calorie malnutrition ([place]) 04/16/2020  . Allergy, unspecified, initial encounter 04/11/2020  . Anaphylactic shock, unspecified, initial encounter 04/11/2020  . Anemia, unspecified 04/03/2020  . Coagulation defect, unspecified ([place]) 04/03/2020  . Diarrhea, unspecified 04/03/2020  . Dyspnea, unspecified 04/03/2020  . Encounter for immunization 04/03/2020  . Iron deficiency anemia, unspecified 04/03/2020  . Nocturia 04/03/2020  . Other specified arthritis, multiple sites 04/03/2020  . Pruritus, unspecified 04/03/2020  . Secondary hyperparathyroidism of renal origin ([place]) 04/03/2020  . Renal failure   . Intractable vomiting   . Hyponatremia 03/17/2020  . Primary insomnia 01/20/2019  . History of total knee replacement, bilateral 01/01/2018  . BMI 27.0-27.9,adult 06/29/2015  . Chronic back pain 07/06/2014  . Essential hypertension, benign 12/21/2012  . Hyperlipidemia with target LDL less than 130 12/21/2012  . Hypothyroidism 12/21/2012  . Depression 12/21/2012   PCP:  Leeroy Cha, MD Pharmacy:   [place], [address] [address] Phone: [phone] Fax: [phone]  [facility] [place], [address] [place] [address] Phone: [phone] Fax: [phone]  [id number] - [place], [address] [place] [address] Phone: [phone] Fax: [phone]  [facility] [place], [place] - [address] [place], [address] [address] Phone: [phone] Fax: [phone]  Readmission Risk Interventions No flowsheet data found.

## 2021-01-17 NOTE — Discharge Instructions (Signed)
Heart Failure, Self-Care Heart failure is a serious condition. The following information explains the things you need to do to take care of yourself after a heart failure diagnosis. You may be asked to change your diet, take certain medicines, and make other lifestyle changes in order to stay as healthy as possible. Your health care provider may also give you more specific instructions. If you have problems or questions, contact your health care provider. What are the risks? Having heart failure puts you at higher risk for certain problems. These problems can get worse if you do not take good care of yourself. Problems may include:  Damage to the kidneys, liver, or lungs.  Malnutrition.  Abnormal heart rhythms.  Blood clotting issues that could cause a stroke. Supplies needed:  Scale for monitoring weight.  Blood pressure monitor.  Notebook.  Medicines. How to care for yourself when you have heart failure Medicines Take over-the-counter and prescription medicines only as told by your health care provider. Medicines reduce the workload of your heart, slow the progression of heart failure, and improve symptoms. Take your medicines every day.  Do not stop taking your medicine unless your health care provider tells you to do so.  Do not skip any dose of medicine.  Refill your prescriptions before you run out of medicine.  Talk with your health care provider if you cannot afford your medicines. Eating and drinking  Eat heart-healthy foods. Talk with a dietitian to make an eating plan that is right for you. ? Limit salt (sodium) if told by your health care provider. Sodium restriction may reduce symptoms of heart failure. Ask a dietitian to recommend heart-healthy seasonings. ? Use healthy cooking methods instead of frying. Healthy methods include roasting, grilling, broiling, baking, poaching, steaming, and stir-frying. ? Choose foods that contain no trans fat and are low in saturated  fat and cholesterol. Healthy choices include fresh or frozen fruits and vegetables, fish, lean meats, legumes, fat-free or low-fat dairy products, and whole-grain or high-fiber foods.  Limit your fluid intake, if directed by your health care provider. Fluid restriction may reduce symptoms of heart failure.   Alcohol use  Do not drink alcohol if: ? Your health care provider tells you not to drink. ? Your heart was damaged by alcohol, or you have severe heart failure. ? You are pregnant, may be pregnant, or are planning to become pregnant.  If you drink alcohol: ? Limit how much you have to:  0-1 drink a day for women.  0-2 drinks a day for men. ? Know how much alcohol is in your drink. In the [place] one drink equals one 12 oz bottle of beer (355 mL), one 5 oz glass of wine (148 mL), or one 1 oz glass of hard liquor (44 mL). Lifestyle  Do not use any products that contain nicotine or tobacco. These products include cigarettes, chewing tobacco, and vaping devices, such as e-cigarettes. If you need help quitting, ask your health care provider. ? Do not use nicotine gum or patches before talking to your health care provider.  Do not use illegal drugs.  Work with your health care provider to safely reach the right body weight.  Do physical activity if told by your health care provider. Talk to your health care provider before you begin an exercise if: ? You are an older adult. ? You have severe heart failure.  Learn to manage stress. If you need help to do this, ask your health care provider.  Participate  in or seek physical rehabilitation as needed to keep or improve your independence and quality of life.  Participate in a cardiac rehabilitation program, which is a treatment program to improve your health and well-being through exercise training, education, and counseling.  Plan rest periods when you get tired.   Monitoring important information  Weigh yourself every day. This will  help you to notice if too much fluid is building up in your body. ? Weigh yourself every morning after you urinate and before you eat breakfast. ? Wear the same amount of clothing each time you weigh yourself. ? Record your daily weight. Provide your health care provider with your weight record.  Monitor and record your pulse and blood pressure as told by your health care provider.   Dealing with extreme temperatures  If the weather is extremely hot: ? Avoid vigorous physical activity. ? Use air conditioning or fans, or find a cooler location. ? Avoid caffeine and alcohol. ? Wear loose-fitting, lightweight, and light-colored clothing.  If the weather is extremely cold: ? Avoid vigorous activity. ? Layer your clothes. ? Wear mittens or gloves, a hat, and a face covering when you go outside. ? Avoid alcohol. Follow these instructions at home:  Stay up to date with vaccines. Pneumococcal and flu (influenza) vaccines are especially important in preventing infections of the airways.  Keep all follow-up visits. This is important. Contact a health care provider if you:  Gain 2-3 lb (1-1.4 kg) in 24 hours or 5 lb (2.3 kg) in a week.  Have increasing shortness of breath.  Are unable to participate in your usual physical activities.  Get tired easily.  Cough more than normal, especially with physical activity.  Lose your appetite or feel nauseous.  Have any swelling or more swelling in areas such as your hands, feet, ankles, or abdomen.  Are unable to sleep because it is hard to breathe.  Feel like your heart is beating quickly (palpitations).  Become dizzy or light-headed when you stand up.  Have feelings of depression or sadness. Get help right away if you:  Have trouble breathing.  Notice, or your family notices, a change in your awareness, such as having trouble staying awake or concentrating.  Have pain or discomfort in your chest.  Have an episode of fainting  (syncope). These symptoms may represent a serious problem that is an emergency. Do not wait to see if the symptoms will go away. Get medical help right away. Call your local emergency services (911 in the [place] Do not drive yourself to the hospital. Summary  Heart failure is a serious condition. To care for yourself, you may be asked to change your diet, take certain medicines, and make other lifestyle changes.  Take your medicines every day. Do not stop taking them unless your health care provider tells you to do so.  Limit salt and eat heart-healthy foods, such as fresh or frozen fruits and vegetables, fish, lean meats, legumes, fat-free or low-fat dairy products, and whole-grain or high-fiber foods.  Ask your health care provider if you have any alcohol restrictions. You may have to stop drinking alcohol if you have severe heart failure.  Contact your health care provider if you notice problems, such as rapid weight gain or a fast heartbeat. Get help right away if you faint or have chest pain or trouble breathing. This information is not intended to replace advice given to you by your health care provider. Make sure you discuss any questions you have  with your health care provider. °Document Revised: 03/31/2020 Document Reviewed: 03/31/2020 °[employer] Patient Education © 2021 [employer] ° °

## 2021-01-17 NOTE — Discharge Summary (Signed)
Physician Discharge Summary  Jasmine White HBZ:[id number] DOB: 31-Mar-1946 DOA: 01/15/2021  PCP: Leeroy Cha, MD  Admit date: 01/15/2021 Discharge date: 01/17/2021  Time spent: 50* minutes  Recommendations for Outpatient Follow-up:  1. Follow-up cardiology as outpatient   Discharge Diagnoses:  Active Problems:   Acute CHF (congestive heart failure) (HCC)   CHF (congestive heart failure) (HCC)   Protein-calorie malnutrition, severe   Discharge Condition: Stable  Diet recommendation: Heart healthy diet  Filed Weights   01/16/21 0430 01/16/21 2222 01/17/21 0222  Weight: 50 kg 51.9 kg 49.9 kg    History of present illness:  75 year old female with history of ESRD on HD MWF, positive ANCA interstitial lung disease, diffuse lower hemorrhage, moderate mitral regurgitation, hypothyroidism presented with worsening shortness of breath.  Patient symptoms started about a month ago and gradually got worse.  Patient also has been racing heartbeat.  She also had chest tightness and went to see his pulmonologist who suspected new onset CHF and sent her to the ED.  In the ED chest x-ray showed bilateral pleural effusion.  BNP was elevated to 3681.  Hospital Course:   1. New onset CHF-BNP was elevated to 3,681, she does have moderate mitral regurgitation.  Echocardiogram done today shows EF of 30 to 35%, left ventricle has moderately decreased function.  Also shows global hypokinesis.  Patient is getting hemodialysis.  Continue Coreg 3.125 mg p.o. twice daily.    Cardiology was consulted, patient underwent right and left heart catheterization, patient is hemodynamically stable cardiology recommends to discharge patient on Coreg.  Cardiology follow patient as outpatient.  2. ESRD-on hemodialysis.    Monday Wednesday Friday.  Patient can get tomorrow's hemodialysis as outpatient.  3. Hypertension-patient has not been diagnosed with hypertension in the past.  Started on Coreg.  Blood  pressure is stable.  4. Pulmonary fibrosis-followed by pulmonology as outpatient.  She is currently not requiring oxygen, O2 sats 100% on room air.  5. Hypothyroidism-continue Synthroid   Procedures:    Consultations:  Cardiology  Discharge Exam: Vitals:   01/17/21 1435 01/17/21 1505  BP: 126/73 125/72  Pulse: 89 91  Resp: 20 (!) 22  Temp:    SpO2: 100% 100%    General: Appears in no acute distress Cardiovascular: S1-S2, regular Respiratory: Clear to auscultation bilaterally  Discharge Instructions   Discharge Instructions    Diet - low sodium heart healthy   Complete by: As directed    Increase activity slowly   Complete by: As directed      Allergies as of 01/17/2021      Reactions   Hctz [hydrochlorothiazide] Other (See Comments)   Hx of severe Hyponatremia while taking HCTZ - now contraindicated   Other Other (See Comments)   If ever given "strong" pain medication, the patient will require something to counteract the nausea   Percocet [oxycodone-acetaminophen] Nausea Only   Ultram [tramadol] Nausea Only, Other (See Comments)   Patient remarked this made her "feel crazy" (also)      Medication List    TAKE these medications   acetaminophen 500 MG tablet Commonly known as: TYLENOL Take 500-1,000 mg by mouth daily as needed (for pain).   albuterol 108 (90 Base) MCG/ACT inhaler Commonly known as: VENTOLIN HFA Inhale 2 puffs into the lungs every 6 (six) hours as needed for wheezing or shortness of breath.   b complex-vitamin c-folic acid 0.8 MG Tabs tablet Take 1 tablet by mouth at bedtime.   carvedilol 3.125 MG tablet Commonly known  as: COREG Take 1 tablet (3.125 mg total) by mouth 2 (two) times daily with a meal.   escitalopram 10 MG tablet Commonly known as: LEXAPRO Take 10 mg by mouth daily in the afternoon.   HYDROcodone-acetaminophen 5-325 MG tablet Commonly known as: NORCO/VICODIN Take 0.5 tablets by mouth at bedtime as needed (for  pain).   hydrocortisone 1 % lotion Apply 1 application topically 2 (two) times daily as needed for itching.   sevelamer carbonate 800 MG tablet Commonly known as: RENVELA Take 800-3,600 mg by mouth See admin instructions. Take 3,600 mg by mouth with each meal and 800 mg with each snack   Synthroid 88 MCG tablet Generic drug: levothyroxine TAKE 1 TABLET BY MOUTH ONCE DAILY BEFORE BREAKFAST What changed:   how much to take  how to take this  when to take this  additional instructions   traZODone 50 MG tablet Commonly known as: DESYREL Take 1 tablet (50 mg total) by mouth at bedtime as needed for sleep.      Allergies  Allergen Reactions  . Hctz [Hydrochlorothiazide] Other (See Comments)    Hx of severe Hyponatremia while taking HCTZ - now contraindicated  . Other Other (See Comments)    If ever given "strong" pain medication, the patient will require something to counteract the nausea  . Percocet [Oxycodone-Acetaminophen] Nausea Only  . Ultram [Tramadol] Nausea Only and Other (See Comments)    Patient remarked this made her "feel crazy" (also)    Follow-up Information    [facility], [facility] Follow up.   Specialty: Home Health Services Why: Physical Therapy-office to call to schedule a visit time.  Contact information: [place] [address] [phone]        Richardson Dopp T, PA-C Follow up on 03/01/2021.   Specialties: Cardiology, Physician Assistant Why: at 8:45am. Please arrive at 8:30am  Contact information: [address] [place] [address] [phone]                The results of significant diagnostics from this hospitalization (including imaging, microbiology, ancillary and laboratory) are listed below for reference.    Significant Diagnostic Studies: DG Chest 1 View  Result Date: 01/16/2021 CLINICAL DATA:  Congestive heart failure EXAM: CHEST  1 VIEW COMPARISON:  01/15/2021 FINDINGS:  Pulmonary insufflation is stable. Diffuse interstitial prominence is again identified, slightly improved from a prior examination and likely reflecting trace, improving interstitial pulmonary edema. Small bilateral pleural effusions are unchanged. Superimposed right basilar consolidation is stable. Cardiac size within normal limits. No pneumothorax. IMPRESSION: Interval improvement in probable trace interstitial pulmonary edema. Stable small bilateral pleural effusions. Stable superimposed right basilar consolidation possibly related to superimposed acute infection or aspiration. Electronically Signed   By: Fidela Salisbury MD   On: 01/16/2021 07:20   DG Chest 2 View  Result Date: 01/15/2021 CLINICAL DATA:  Shortness of breath. EXAM: CHEST - 2 VIEW COMPARISON:  CT 12/17/2020.  Chest x-ray 12/12/2020, 04/18/2020. FINDINGS: Mediastinum and hilar structures normal. Stable cardiomegaly. Diffuse bilateral interstitial prominence again noted. Similar findings noted on prior exams. Reference is made to CT report of 12/17/2020. Small bilateral pleural effusions noted. No pneumothorax. IMPRESSION: 1.  Stable cardiomegaly. 2. Diffuse bilateral interstitial prominence again noted. Similar findings noted on prior exam. Reference is made to prior CT report of 12/17/2020. Small bilateral pleural effusions again noted. Electronically Signed   By: Marcello Moores  Register   On: 01/15/2021 11:20   CARDIAC CATHETERIZATION  Result  Date: 01/17/2021 DANNE SCARDINA is a 75 y.o. female  [id number] LOCATION:  FACILITY: [facility] PHYSICIAN: Quay Burow, M.D. 1945-11-26 DATE OF PROCEDURE:  01/17/2021 DATE OF DISCHARGE: CARDIAC CATHETERIZATION History obtained from chart review.Ms. Nakagawa is a 75 year old woman admitted with new onset congestive heart failure.  She has an interesting history.  She reports that she was very healthy until 2020 when she developed COVID-19 pneumonia.  She used to walk 5 to 6 miles every day and work out at [employer].   She developed multiple problems including ANCA positive interstitial lung disease and end-stage renal disease.  She reports that she has been on hemodialysis now for approximately 6 months.  The patient describes progressive fatigue.  She has developed shortness of breath at rest, orthopnea, and PND over the last 4 to 6 weeks.  She frequently has to sit up on the bedside at night and lean forward in order to breathe well.  She feels a little bit better since undergoing back-to-back dialysis the past few days.  She had developed ankle swelling and this is now resolved.  She continues to experience orthopnea and PND with slight improvement.  She has felt a pressure in her chest at times, but no exertional angina.  The patient is a lifelong non-smoker.  During her hospitalization, she is found to have markedly elevated BNP.  She has small bilateral pleural effusions with mild interstitial edema.  An echocardiogram was performed today and demonstrates new LV dysfunction with an LVEF of 30 to 35%, moderately severe mitral regurgitation, and moderate aortic insufficiency.  She was referred for right left heart cath to define her anatomy and physiology.   Ms. Millikin has essentially normal coronary arteries and normal filling pressures.  She appears to be euvolemic.  She did not appear to have a large V wave although by echo she has severe mitral regurgitation.  Guideline directed optimal medical therapy will be recommended.  She may be a candidate for MitraClip which Dr. Burt Knack will address.  The right common femoral vein had Mynx closure successfully.  The arterial sheath will be pulled manually given the fact that there is very little subcutaneous tissue.  The patient left lab in stable condition. Quay Burow. MD, [facility] 01/17/2021 11:26 AM   ECHOCARDIOGRAM COMPLETE  Result Date: 01/16/2021    ECHOCARDIOGRAM REPORT   Patient Name:   NIASHA DEVINS Date of Exam: 01/16/2021 Medical Rec #:  [id number]       Height:        66.0 in Accession #:    [id number]      Weight:       110.2 lb Date of Birth:  02/07/1946       BSA:          1.552 m Patient Age:    75 years        BP:           137/85 mmHg Patient Gender: F               HR:           90 bpm. Exam Location:  Inpatient Procedure: 2D Echo, Cardiac Doppler, Color Doppler and Strain Analysis  Results communicated to Dr Darrick Meigs at 1435. Indications:     CHF  History:         Patient has prior history of Echocardiogram examinations, most                  recent 04/06/2020. Signs/Symptoms:Hypotension;  Risk                  Factors:Hypertension and Dyslipidemia.  Sonographer:     [place] Referring Phys:  [id number] Lequita Halt Diagnosing Phys: Oswaldo Milian MD IMPRESSIONS  1. Left ventricular ejection fraction, by estimation, is 30 to 35%. The left ventricle has moderately decreased function. The left ventricle demonstrates global hypokinesis. The left ventricular internal cavity size was mildly dilated. There is mild left ventricular hypertrophy. Left ventricular diastolic parameters are consistent with Grade II diastolic dysfunction (pseudonormalization). Elevated left atrial pressure.  2. Right ventricular systolic function is normal. The right ventricular size is normal. There is mildly elevated pulmonary artery systolic pressure. The estimated right ventricular systolic pressure is 73.4 mmHg.  3. Left atrial size was severely dilated.  4. Right atrial size was mildly dilated.  5. The mitral valve is abnormal. Moderate to severe mitral valve regurgitation.  6. Tricuspid valve regurgitation is moderate.  7. The aortic valve is tricuspid. Aortic valve regurgitation is moderate. Mild to moderate aortic valve sclerosis/calcification is present, without any evidence of aortic stenosis.  8. The inferior vena cava is normal in size with <50% respiratory variability, suggesting right atrial pressure of 8 mmHg. FINDINGS  Left Ventricle: Left ventricular ejection fraction, by  estimation, is 30 to 35%. The left ventricle has moderately decreased function. The left ventricle demonstrates global hypokinesis. The left ventricular internal cavity size was mildly dilated. There is mild left ventricular hypertrophy. Left ventricular diastolic parameters are consistent with Grade II diastolic dysfunction (pseudonormalization). Elevated left atrial pressure. Right Ventricle: The right ventricular size is normal. No increase in right ventricular wall thickness. Right ventricular systolic function is normal. There is mildly elevated pulmonary artery systolic pressure. The tricuspid regurgitant velocity is 2.66  m/s, and with an assumed right atrial pressure of 8 mmHg, the estimated right ventricular systolic pressure is 19.3 mmHg. Left Atrium: Left atrial size was severely dilated. Right Atrium: Right atrial size was mildly dilated. Pericardium: There is no evidence of pericardial effusion. Mitral Valve: The mitral valve is abnormal. Moderate to severe mitral valve regurgitation. MV peak gradient, 11.0 mmHg. The mean mitral valve gradient is 6.0 mmHg. Tricuspid Valve: The tricuspid valve is normal in structure. Tricuspid valve regurgitation is moderate. Aortic Valve: The aortic valve is tricuspid. Aortic valve regurgitation is moderate. Aortic regurgitation PHT measures 278 msec. Mild to moderate aortic valve sclerosis/calcification is present, without any evidence of aortic stenosis. Aortic valve mean gradient measures 5.0 mmHg. Aortic valve peak gradient measures 10.3 mmHg. Aortic valve area, by VTI measures 1.32 cm. Pulmonic Valve: The pulmonic valve was not well visualized. Pulmonic valve regurgitation is not visualized. Aorta: The aortic root and ascending aorta are structurally normal, with no evidence of dilitation. Venous: The inferior vena cava is normal in size with less than 50% respiratory variability, suggesting right atrial pressure of 8 mmHg. IAS/Shunts: No atrial level shunt  detected by color flow Doppler. Additional Comments: There is a small pleural effusion in the left lateral region.  LEFT VENTRICLE PLAX 2D LVIDd:         5.40 cm      Diastology LV PW:         1.10 cm      LV e' medial:    3.71 cm/s LV IVS:        1.20 cm      LV E/e' medial:  46.6 LVOT diam:     1.90 cm  LV e' lateral:   5.57 cm/s LV SV:         31           LV E/e' lateral: 31.1 LV SV Index:   20 LVOT Area:     2.84 cm  LV Volumes (MOD) LV vol d, MOD A2C: 115.0 ml LV vol d, MOD A4C: 118.0 ml LV vol s, MOD A2C: 82.3 ml LV vol s, MOD A4C: 77.1 ml LV SV MOD A2C:     32.7 ml LV SV MOD A4C:     118.0 ml LV SV MOD BP:      37.8 ml RIGHT VENTRICLE RV S prime:     15.40 cm/s TAPSE (M-mode): 2.2 cm LEFT ATRIUM              Index       RIGHT ATRIUM           Index LA diam:        3.70 cm  2.38 cm/m  RA Area:     17.00 cm LA Vol (A2C):   96.2 ml  61.97 ml/m RA Volume:   45.90 ml  29.57 ml/m LA Vol (A4C):   111.0 ml 71.51 ml/m LA Biplane Vol: 109.0 ml 70.22 ml/m  AORTIC VALVE                    PULMONIC VALVE AV Area (Vmax):    1.54 cm     PV Vmax:       0.53 m/s AV Area (Vmean):   1.49 cm     PV Vmean:      47.800 cm/s AV Area (VTI):     1.32 cm     PV VTI:        0.108 m AV Vmax:           160.50 cm/s  PV Peak grad:  1.1 mmHg AV Vmean:          106.450 cm/s PV Mean grad:  1.0 mmHg AV VTI:            0.238 m AV Peak Grad:      10.3 mmHg AV Mean Grad:      5.0 mmHg LVOT Vmax:         87.30 cm/s LVOT Vmean:        56.100 cm/s LVOT VTI:          0.111 m LVOT/AV VTI ratio: 0.47 AI PHT:            278 msec AR Vena Contracta: 0.20 cm  AORTA Ao Root diam: 3.10 cm Ao Asc diam:  3.40 cm MITRAL VALVE                 TRICUSPID VALVE MV Area (PHT): 6.54 cm      TR Peak grad:   28.3 mmHg MV Area VTI:   0.89 cm      TR Vmax:        266.00 cm/s MV Peak grad:  11.0 mmHg MV Mean grad:  6.0 mmHg      SHUNTS MV Vmax:       1.66 m/s      Systemic VTI:  0.11 m MV Vmean:      113.0 cm/s    Systemic Diam: 1.90 cm MV Decel Time: 116  msec MR Peak grad:    124.5 mmHg MR Mean grad:    73.0 mmHg MR Vmax:  558.00 cm/s MR Vmean:        393.0 cm/s MR PISA:         2.26 cm MR PISA Eff ROA: 10 mm MR PISA Radius:  0.60 cm MV E velocity: 173.00 cm/s MV A velocity: 124.00 cm/s MV E/A ratio:  1.40 Oswaldo Milian MD Electronically signed by Oswaldo Milian MD Signature Date/Time: 01/16/2021/2:30:10 PM    Final (Updated)     Microbiology: Recent Results (from the past 240 hour(s))  Resp Panel by RT-PCR (Flu A&B, Covid) Nasopharyngeal Swab     Status: None   Collection Time: 01/15/21 11:39 AM   Specimen: Nasopharyngeal Swab; Nasopharyngeal(NP) swabs in vial transport medium  Result Value Ref Range Status   SARS Coronavirus 2 by RT PCR NEGATIVE NEGATIVE Final    Comment: (NOTE) SARS-CoV-2 target nucleic acids are NOT DETECTED.  The SARS-CoV-2 RNA is generally detectable in upper respiratory specimens during the acute phase of infection. The lowest concentration of SARS-CoV-2 viral copies this assay can detect is 138 copies/mL. A negative result does not preclude SARS-Cov-2 infection and should not be used as the sole basis for treatment or other patient management decisions. A negative result may occur with  improper specimen collection/handling, submission of specimen other than nasopharyngeal swab, presence of viral mutation(s) within the areas targeted by this assay, and inadequate number of viral copies(<138 copies/mL). A negative result must be combined with clinical observations, patient history, and epidemiological information. The expected result is Negative.  Fact Sheet for Patients:  EntrepreneurPulse.com.au  Fact Sheet for Healthcare Providers:  IncredibleEmployment.be  This test is no t yet approved or cleared by the [place] FDA and  has been authorized for detection and/or diagnosis of SARS-CoV-2 by FDA under an Emergency Use Authorization (EUA). This EUA  will remain  in effect (meaning this test can be used) for the duration of the COVID-19 declaration under Section 564(b)(1) of the Act, 21 U.S.C.section 360bbb-3(b)(1), unless the authorization is terminated  or revoked sooner.       Influenza A by PCR NEGATIVE NEGATIVE Final   Influenza B by PCR NEGATIVE NEGATIVE Final    Comment: (NOTE) The Xpert Xpress SARS-CoV-2/FLU/RSV plus assay is intended as an aid in the diagnosis of influenza from Nasopharyngeal swab specimens and should not be used as a sole basis for treatment. Nasal washings and aspirates are unacceptable for Xpert Xpress SARS-CoV-2/FLU/RSV testing.  Fact Sheet for Patients: EntrepreneurPulse.com.au  Fact Sheet for Healthcare Providers: IncredibleEmployment.be  This test is not yet approved or cleared by the [place] FDA and has been authorized for detection and/or diagnosis of SARS-CoV-2 by FDA under an Emergency Use Authorization (EUA). This EUA will remain in effect (meaning this test can be used) for the duration of the COVID-19 declaration under Section 564(b)(1) of the Act, 21 U.S.C. section 360bbb-3(b)(1), unless the authorization is terminated or revoked.  Performed at [facility], [place] [address], [place], [place] [address]      Labs: Basic Metabolic Panel: Recent Labs  Lab 01/15/21 1054 01/16/21 0412 01/17/21 0254 01/17/21 1059 01/17/21 1102  NA 132* 133* 136 138  138 138  K 3.6 3.5 3.5 3.6  3.7 3.6  CL 88* 93* 96*  --   --   CO2 32 29 30  --   --   GLUCOSE 95 85 91  --   --   BUN 46* 27* 12  --   --   CREATININE 5.43* 3.94* 2.55*  --   --  CALCIUM 10.0 9.7 9.5  --   --    Liver Function Tests: Recent Labs  Lab 01/15/21 1054  AST 51*  ALT 25  ALKPHOS 138*  BILITOT 1.1  PROT 6.7  ALBUMIN 3.8   No results for input(s): LIPASE, AMYLASE in the last 168 hours. No results for input(s): AMMONIA in the last 168 hours. CBC: Recent Labs   Lab 01/15/21 1054 01/17/21 1059 01/17/21 1102  WBC 5.7  --   --   NEUTROABS 3.8  --   --   HGB 11.7* 10.9*  10.9* 10.9*  HCT 37.3 32.0*  32.0* 32.0*  MCV 95.6  --   --   PLT 163  --   --    Cardiac Enzymes: No results for input(s): CKTOTAL, CKMB, CKMBINDEX, TROPONINI in the last 168 hours. BNP: BNP (last 3 results) Recent Labs    03/21/20 0509 03/22/20 0103 04/05/20 1638  BNP 2,263.4* 3,320.9* 2,852.3*    ProBNP (last 3 results) Recent Labs    12/12/20 1509  PROBNP 3,681.0*    CBG: No results for input(s): GLUCAP in the last 168 hours.     Signed:  Oswald Hillock MD.  [facility] 01/17/2021, 4:03 PM

## 2021-01-17 NOTE — Progress Notes (Signed)
[facility] Progress Note   Subjective:   S/p HD yesterday with 2.0L UF, tolerated well; post wt was 49.9kg, EDW was 52kg. Reports breathing is much better now. Cardiology following and LHC/RHC planned for today. Denies CP, palpitations, dizziness, abdominal pain and nausea.   Objective Vitals:   01/17/21 0222 01/17/21 0244 01/17/21 0654 01/17/21 0904  BP: 125/81 110/69 135/82 114/75  Pulse: 94 91 98 79  Resp: (!) 28 20 (!) 25 17  Temp: 98.1 F (36.7 C) 97.6 F (36.4 C) 98 F (36.7 C) 98.1 F (36.7 C)  TempSrc: Oral Oral Oral Oral  SpO2: 100% 99% 99% 100%  Weight: 49.9 kg     Height:       Physical Exam General: WDWN female, alert and in NAD Heart: RRR, no murmur Lungs: normal WOB on RA Abdomen: Soft, non-distended, +BS Extremities: No edema b/l lower extremities Dialysis Access:  LUE AVF + bruit  Additional Objective Labs: Basic Metabolic Panel: Recent Labs  Lab 01/15/21 1054 01/16/21 0412 01/17/21 0254  NA 132* 133* 136  K 3.6 3.5 3.5  CL 88* 93* 96*  CO2 32 29 30  GLUCOSE 95 85 91  BUN 46* 27* 12  CREATININE 5.43* 3.94* 2.55*  CALCIUM 10.0 9.7 9.5   Liver Function Tests: Recent Labs  Lab 01/15/21 1054  AST 51*  ALT 25  ALKPHOS 138*  BILITOT 1.1  PROT 6.7  ALBUMIN 3.8   CBC: Recent Labs  Lab 01/15/21 1054  WBC 5.7  NEUTROABS 3.8  HGB 11.7*  HCT 37.3  MCV 95.6  PLT 163   Blood Culture    Component Value Date/Time   SDES FLUID PLEURAL RIGHT 04/05/2020 1530   SDES BRONCHIAL ALVEOLAR LAVAGE 04/05/2020 1530   SPECREQUEST NONE 04/05/2020 1530   SPECREQUEST NONE 04/05/2020 1530   CULT  04/05/2020 1530    NO GROWTH 3 DAYS Performed at [facility], Mobile [address] [place], [place] [address]    CULT  04/05/2020 1530    Consistent with normal respiratory flora. Performed at [facility], [place] [address] [place], [place] [address]    REPTSTATUS 04/09/2020 FINAL 04/05/2020 1530   REPTSTATUS 04/07/2020 FINAL  04/05/2020 1530   Studies/Results: DG Chest 1 View  Result Date: 01/16/2021 CLINICAL DATA:  Congestive heart failure EXAM: CHEST  1 VIEW COMPARISON:  01/15/2021 FINDINGS: Pulmonary insufflation is stable. Diffuse interstitial prominence is again identified, slightly improved from a prior examination and likely reflecting trace, improving interstitial pulmonary edema. Small bilateral pleural effusions are unchanged. Superimposed right basilar consolidation is stable. Cardiac size within normal limits. No pneumothorax. IMPRESSION: Interval improvement in probable trace interstitial pulmonary edema. Stable small bilateral pleural effusions. Stable superimposed right basilar consolidation possibly related to superimposed acute infection or aspiration. Electronically Signed   By: Fidela Salisbury MD   On: 01/16/2021 07:20   DG Chest 2 View  Result Date: 01/15/2021 CLINICAL DATA:  Shortness of breath. EXAM: CHEST - 2 VIEW COMPARISON:  CT 12/17/2020.  Chest x-ray 12/12/2020, 04/18/2020. FINDINGS: Mediastinum and hilar structures normal. Stable cardiomegaly. Diffuse bilateral interstitial prominence again noted. Similar findings noted on prior exams. Reference is made to CT report of 12/17/2020. Small bilateral pleural effusions noted. No pneumothorax. IMPRESSION: 1.  Stable cardiomegaly. 2. Diffuse bilateral interstitial prominence again noted. Similar findings noted on prior exam. Reference is made to prior CT report of 12/17/2020. Small bilateral pleural effusions again noted. Electronically Signed   By: Marcello Moores  Register   On:  01/15/2021 11:20   ECHOCARDIOGRAM COMPLETE  Result Date: 01/16/2021    ECHOCARDIOGRAM REPORT   Patient Name:   Jasmine White Date of Exam: 01/16/2021 Medical Rec #:  [id number]       Height:       66.0 in Accession #:    [id number]      Weight:       110.2 lb Date of Birth:  [birth year]       BSA:          1.552 m Patient Age:    75 years        BP:           137/85 mmHg Patient Gender: F                HR:           90 bpm. Exam Location:  Inpatient Procedure: 2D Echo, Cardiac Doppler, Color Doppler and Strain Analysis  Results communicated to Dr Darrick Meigs at 1435. Indications:     CHF  History:         Patient has prior history of Echocardiogram examinations, most                  recent 04/06/2020. Signs/Symptoms:Hypotension; Risk                  Factors:Hypertension and Dyslipidemia.  Sonographer:     [place] Referring Phys:  [id number] Lequita Halt Diagnosing Phys: Oswaldo Milian MD IMPRESSIONS  1. Left ventricular ejection fraction, by estimation, is 30 to 35%. The left ventricle has moderately decreased function. The left ventricle demonstrates global hypokinesis. The left ventricular internal cavity size was mildly dilated. There is mild left ventricular hypertrophy. Left ventricular diastolic parameters are consistent with Grade II diastolic dysfunction (pseudonormalization). Elevated left atrial pressure.  2. Right ventricular systolic function is normal. The right ventricular size is normal. There is mildly elevated pulmonary artery systolic pressure. The estimated right ventricular systolic pressure is 41.6 mmHg.  3. Left atrial size was severely dilated.  4. Right atrial size was mildly dilated.  5. The mitral valve is abnormal. Moderate to severe mitral valve regurgitation.  6. Tricuspid valve regurgitation is moderate.  7. The aortic valve is tricuspid. Aortic valve regurgitation is moderate. Mild to moderate aortic valve sclerosis/calcification is present, without any evidence of aortic stenosis.  8. The inferior vena cava is normal in size with <50% respiratory variability, suggesting right atrial pressure of 8 mmHg. FINDINGS  Left Ventricle: Left ventricular ejection fraction, by estimation, is 30 to 35%. The left ventricle has moderately decreased function. The left ventricle demonstrates global hypokinesis. The left ventricular internal cavity size was mildly dilated. There  is mild left ventricular hypertrophy. Left ventricular diastolic parameters are consistent with Grade II diastolic dysfunction (pseudonormalization). Elevated left atrial pressure. Right Ventricle: The right ventricular size is normal. No increase in right ventricular wall thickness. Right ventricular systolic function is normal. There is mildly elevated pulmonary artery systolic pressure. The tricuspid regurgitant velocity is 2.66  m/s, and with an assumed right atrial pressure of 8 mmHg, the estimated right ventricular systolic pressure is 60.6 mmHg. Left Atrium: Left atrial size was severely dilated. Right At[facility]: Right atrial size was mildly dilated. Pericardium: There is no evidence of pericardial effusion. Mitral Valve: The mitral valve is abnormal. Moderate to severe mitral valve regurgitation. MV peak gradient, 11.0 mmHg. The mean mitral valve gradient is 6.0 mmHg. Tricuspid Valve: The tricuspid valve is normal  in structure. Tricuspid valve regurgitation is moderate. Aortic Valve: The aortic valve is tricuspid. Aortic valve regurgitation is moderate. Aortic regurgitation PHT measures 278 msec. Mild to moderate aortic valve sclerosis/calcification is present, without any evidence of aortic stenosis. Aortic valve mean gradient measures 5.0 mmHg. Aortic valve peak gradient measures 10.3 mmHg. Aortic valve area, by VTI measures 1.32 cm. Pulmonic Valve: The pulmonic valve was not well visualized. Pulmonic valve regurgitation is not visualized. Aorta: The aortic root and ascending aorta are structurally normal, with no evidence of dilitation. Venous: The inferior vena cava is normal in size with less than 50% respiratory variability, suggesting right atrial pressure of 8 mmHg. IAS/Shunts: No atrial level shunt detected by color flow Doppler. Additional Comments: There is a small pleural effusion in the left lateral region.  LEFT VENTRICLE PLAX 2D LVIDd:         5.40 cm      Diastology LV PW:         1.10 cm       LV e' medial:    3.71 cm/s LV IVS:        1.20 cm      LV E/e' medial:  46.6 LVOT diam:     1.90 cm      LV e' lateral:   5.57 cm/s LV SV:         31           LV E/e' lateral: 31.1 LV SV Index:   20 LVOT Area:     2.84 cm  LV Volumes (MOD) LV vol d, MOD A2C: 115.0 ml LV vol d, MOD A4C: 118.0 ml LV vol s, MOD A2C: 82.3 ml LV vol s, MOD A4C: 77.1 ml LV SV MOD A2C:     32.7 ml LV SV MOD A4C:     118.0 ml LV SV MOD BP:      37.8 ml RIGHT VENTRICLE RV S prime:     15.40 cm/s TAPSE (M-mode): 2.2 cm LEFT ATRIUM              Index       RIGHT ATRIUM           Index LA diam:        3.70 cm  2.38 cm/m  RA Area:     17.00 cm LA Vol (A2C):   96.2 ml  61.97 ml/m RA Volume:   45.90 ml  29.57 ml/m LA Vol (A4C):   111.0 ml 71.51 ml/m LA Biplane Vol: 109.0 ml 70.22 ml/m  AORTIC VALVE                    PULMONIC VALVE AV Area (Vmax):    1.54 cm     PV Vmax:       0.53 m/s AV Area (Vmean):   1.49 cm     PV Vmean:      47.800 cm/s AV Area (VTI):     1.32 cm     PV VTI:        0.108 m AV Vmax:           160.50 cm/s  PV Peak grad:  1.1 mmHg AV Vmean:          106.450 cm/s PV Mean grad:  1.0 mmHg AV VTI:            0.238 m AV Peak Grad:      10.3 mmHg AV Mean Grad:      5.0 mmHg  LVOT Vmax:         87.30 cm/s LVOT Vmean:        56.100 cm/s LVOT VTI:          0.111 m LVOT/AV VTI ratio: 0.47 AI PHT:            278 msec AR Vena Contracta: 0.20 cm  AORTA Ao Root diam: 3.10 cm Ao Asc diam:  3.40 cm MITRAL VALVE                 TRICUSPID VALVE MV Area (PHT): 6.54 cm      TR Peak grad:   28.3 mmHg MV Area VTI:   0.89 cm      TR Vmax:        266.00 cm/s MV Peak grad:  11.0 mmHg MV Mean grad:  6.0 mmHg      SHUNTS MV Vmax:       1.66 m/s      Systemic VTI:  0.11 m MV Vmean:      113.0 cm/s    Systemic Diam: 1.90 cm MV Decel Time: 116 msec MR Peak grad:    124.5 mmHg MR Mean grad:    73.0 mmHg MR Vmax:         558.00 cm/s MR Vmean:        393.0 cm/s MR PISA:         2.26 cm MR PISA Eff ROA: 10 mm MR PISA Radius:  0.60 cm MV E  velocity: 173.00 cm/s MV A velocity: 124.00 cm/s MV E/A ratio:  1.40 Oswaldo Milian MD Electronically signed by Oswaldo Milian MD Signature Date/Time: 01/16/2021/2:30:10 PM    Final (Updated)    Medications: . sodium chloride    . sodium chloride    . sodium chloride 10 mL/hr at 01/17/21 0622   . (feeding supplement) PROSource Plus  30 mL Oral BID BM  . carvedilol  3.125 mg Oral BID WC  . Chlorhexidine Gluconate Cloth  6 each Topical Q0600  . Chlorhexidine Gluconate Cloth  6 each Topical Q0600  . escitalopram  10 mg Oral Daily  . feeding supplement (NEPRO CARB STEADY)  237 mL Oral BID BM  . heparin  5,000 Units Subcutaneous Q12H  . levothyroxine  88 mcg Oral QAC breakfast  . multivitamin  1 tablet Oral QHS  . sevelamer carbonate  3,600 mg Oral TID WC  . sodium chloride flush  3 mL Intravenous Q12H  . sodium chloride flush  3 mL Intravenous Q12H    Outpatient Dialysis Orders:  Center: [facility] on MWF. 180NRe, 3.5hours, BFR 400, DR 500, EDW 52kg, 2K/2Ca, AVF 15g, no heparin Mircera 157mcg IV q 2 weeks- last dose 01/11/21 No VDRA Renvela 3 tabs PO TID with meals   Assessment/Plan: 1. Shortness of breath: Hx of pulmonary fibrosis but worsening SOB over the past month. Patient has been losing weight and presentation is consistent with volume overload which has improved with UF.  Below EDW now, will continue to challenge, RHC today will be helpful. 2. Interstitial lung disease with Diffuse Alveolar Hemorrhage in July 2021:  In setting of ANCA, GBM positive glomerulonephritis. Followed by pulmonology.  3.  ESRD:  Dialyzes on MWF schedule, extra HD on 4/26 for volume. Next HD planned 4/29. 4.  Hypertension: BP now controlled, not on any antihypertensive medications.  5.  Anemia: Hemoglobin 11.7. No ESA indicated at this time.  6.  Metabolic bone disease: Calcium slightly elevated, will use low Ca  bath 7.  Nutrition:  Renal diet/fluid restriction 8. CHF: TTE EF  30-35% with mod/severe MR, mod AI, cardiology eval and plans R/LHC today.  Volume control with HD.  Jannifer Hick MD [facility] Pager [phone]

## 2021-01-18 DIAGNOSIS — N186 End stage renal disease: Secondary | ICD-10-CM | POA: Diagnosis not present

## 2021-01-18 DIAGNOSIS — Z992 Dependence on renal dialysis: Secondary | ICD-10-CM | POA: Diagnosis not present

## 2021-01-18 DIAGNOSIS — N2581 Secondary hyperparathyroidism of renal origin: Secondary | ICD-10-CM | POA: Diagnosis not present

## 2021-01-18 MED FILL — Verapamil HCl IV Soln 2.5 MG/ML: INTRAVENOUS | Qty: 2 | Status: AC

## 2021-01-19 DIAGNOSIS — Z992 Dependence on renal dialysis: Secondary | ICD-10-CM | POA: Diagnosis not present

## 2021-01-19 DIAGNOSIS — N179 Acute kidney failure, unspecified: Secondary | ICD-10-CM | POA: Diagnosis not present

## 2021-01-19 DIAGNOSIS — N186 End stage renal disease: Secondary | ICD-10-CM | POA: Diagnosis not present

## 2021-01-20 DIAGNOSIS — I509 Heart failure, unspecified: Secondary | ICD-10-CM | POA: Diagnosis not present

## 2021-01-20 DIAGNOSIS — J8410 Pulmonary fibrosis, unspecified: Secondary | ICD-10-CM | POA: Diagnosis not present

## 2021-01-20 DIAGNOSIS — E039 Hypothyroidism, unspecified: Secondary | ICD-10-CM | POA: Diagnosis not present

## 2021-01-20 DIAGNOSIS — E43 Unspecified severe protein-calorie malnutrition: Secondary | ICD-10-CM | POA: Diagnosis not present

## 2021-01-20 DIAGNOSIS — I132 Hypertensive heart and chronic kidney disease with heart failure and with stage 5 chronic kidney disease, or end stage renal disease: Secondary | ICD-10-CM | POA: Diagnosis not present

## 2021-01-20 DIAGNOSIS — N186 End stage renal disease: Secondary | ICD-10-CM | POA: Diagnosis not present

## 2021-01-20 DIAGNOSIS — M1990 Unspecified osteoarthritis, unspecified site: Secondary | ICD-10-CM | POA: Diagnosis not present

## 2021-01-20 DIAGNOSIS — J90 Pleural effusion, not elsewhere classified: Secondary | ICD-10-CM | POA: Diagnosis not present

## 2021-01-20 DIAGNOSIS — I083 Combined rheumatic disorders of mitral, aortic and tricuspid valves: Secondary | ICD-10-CM | POA: Diagnosis not present

## 2021-01-21 DIAGNOSIS — Z992 Dependence on renal dialysis: Secondary | ICD-10-CM | POA: Diagnosis not present

## 2021-01-21 DIAGNOSIS — N2581 Secondary hyperparathyroidism of renal origin: Secondary | ICD-10-CM | POA: Diagnosis not present

## 2021-01-21 DIAGNOSIS — N186 End stage renal disease: Secondary | ICD-10-CM | POA: Diagnosis not present

## 2021-01-23 DIAGNOSIS — I132 Hypertensive heart and chronic kidney disease with heart failure and with stage 5 chronic kidney disease, or end stage renal disease: Secondary | ICD-10-CM | POA: Diagnosis not present

## 2021-01-23 DIAGNOSIS — I083 Combined rheumatic disorders of mitral, aortic and tricuspid valves: Secondary | ICD-10-CM | POA: Diagnosis not present

## 2021-01-23 DIAGNOSIS — N2581 Secondary hyperparathyroidism of renal origin: Secondary | ICD-10-CM | POA: Diagnosis not present

## 2021-01-23 DIAGNOSIS — E43 Unspecified severe protein-calorie malnutrition: Secondary | ICD-10-CM | POA: Diagnosis not present

## 2021-01-23 DIAGNOSIS — N186 End stage renal disease: Secondary | ICD-10-CM | POA: Diagnosis not present

## 2021-01-23 DIAGNOSIS — E039 Hypothyroidism, unspecified: Secondary | ICD-10-CM | POA: Diagnosis not present

## 2021-01-23 DIAGNOSIS — J8410 Pulmonary fibrosis, unspecified: Secondary | ICD-10-CM | POA: Diagnosis not present

## 2021-01-23 DIAGNOSIS — M1990 Unspecified osteoarthritis, unspecified site: Secondary | ICD-10-CM | POA: Diagnosis not present

## 2021-01-23 DIAGNOSIS — I509 Heart failure, unspecified: Secondary | ICD-10-CM | POA: Diagnosis not present

## 2021-01-23 DIAGNOSIS — Z992 Dependence on renal dialysis: Secondary | ICD-10-CM | POA: Diagnosis not present

## 2021-01-23 DIAGNOSIS — J90 Pleural effusion, not elsewhere classified: Secondary | ICD-10-CM | POA: Diagnosis not present

## 2021-01-25 DIAGNOSIS — Z992 Dependence on renal dialysis: Secondary | ICD-10-CM | POA: Diagnosis not present

## 2021-01-25 DIAGNOSIS — N2581 Secondary hyperparathyroidism of renal origin: Secondary | ICD-10-CM | POA: Diagnosis not present

## 2021-01-25 DIAGNOSIS — N186 End stage renal disease: Secondary | ICD-10-CM | POA: Diagnosis not present

## 2021-01-28 DIAGNOSIS — J90 Pleural effusion, not elsewhere classified: Secondary | ICD-10-CM | POA: Diagnosis not present

## 2021-01-28 DIAGNOSIS — M1990 Unspecified osteoarthritis, unspecified site: Secondary | ICD-10-CM | POA: Diagnosis not present

## 2021-01-28 DIAGNOSIS — I132 Hypertensive heart and chronic kidney disease with heart failure and with stage 5 chronic kidney disease, or end stage renal disease: Secondary | ICD-10-CM | POA: Diagnosis not present

## 2021-01-28 DIAGNOSIS — N186 End stage renal disease: Secondary | ICD-10-CM | POA: Diagnosis not present

## 2021-01-28 DIAGNOSIS — N2581 Secondary hyperparathyroidism of renal origin: Secondary | ICD-10-CM | POA: Diagnosis not present

## 2021-01-28 DIAGNOSIS — I509 Heart failure, unspecified: Secondary | ICD-10-CM | POA: Diagnosis not present

## 2021-01-28 DIAGNOSIS — J8410 Pulmonary fibrosis, unspecified: Secondary | ICD-10-CM | POA: Diagnosis not present

## 2021-01-28 DIAGNOSIS — E039 Hypothyroidism, unspecified: Secondary | ICD-10-CM | POA: Diagnosis not present

## 2021-01-28 DIAGNOSIS — E43 Unspecified severe protein-calorie malnutrition: Secondary | ICD-10-CM | POA: Diagnosis not present

## 2021-01-28 DIAGNOSIS — Z992 Dependence on renal dialysis: Secondary | ICD-10-CM | POA: Diagnosis not present

## 2021-01-28 DIAGNOSIS — I083 Combined rheumatic disorders of mitral, aortic and tricuspid valves: Secondary | ICD-10-CM | POA: Diagnosis not present

## 2021-01-29 DIAGNOSIS — I083 Combined rheumatic disorders of mitral, aortic and tricuspid valves: Secondary | ICD-10-CM | POA: Diagnosis not present

## 2021-01-29 DIAGNOSIS — M1990 Unspecified osteoarthritis, unspecified site: Secondary | ICD-10-CM | POA: Diagnosis not present

## 2021-01-29 DIAGNOSIS — N186 End stage renal disease: Secondary | ICD-10-CM | POA: Diagnosis not present

## 2021-01-29 DIAGNOSIS — I509 Heart failure, unspecified: Secondary | ICD-10-CM | POA: Diagnosis not present

## 2021-01-29 DIAGNOSIS — E43 Unspecified severe protein-calorie malnutrition: Secondary | ICD-10-CM | POA: Diagnosis not present

## 2021-01-29 DIAGNOSIS — J90 Pleural effusion, not elsewhere classified: Secondary | ICD-10-CM | POA: Diagnosis not present

## 2021-01-29 DIAGNOSIS — J8410 Pulmonary fibrosis, unspecified: Secondary | ICD-10-CM | POA: Diagnosis not present

## 2021-01-29 DIAGNOSIS — I132 Hypertensive heart and chronic kidney disease with heart failure and with stage 5 chronic kidney disease, or end stage renal disease: Secondary | ICD-10-CM | POA: Diagnosis not present

## 2021-01-29 DIAGNOSIS — E039 Hypothyroidism, unspecified: Secondary | ICD-10-CM | POA: Diagnosis not present

## 2021-01-30 DIAGNOSIS — N2581 Secondary hyperparathyroidism of renal origin: Secondary | ICD-10-CM | POA: Diagnosis not present

## 2021-01-30 DIAGNOSIS — N186 End stage renal disease: Secondary | ICD-10-CM | POA: Diagnosis not present

## 2021-01-30 DIAGNOSIS — Z992 Dependence on renal dialysis: Secondary | ICD-10-CM | POA: Diagnosis not present

## 2021-01-31 DIAGNOSIS — I5031 Acute diastolic (congestive) heart failure: Secondary | ICD-10-CM | POA: Diagnosis not present

## 2021-01-31 DIAGNOSIS — F5101 Primary insomnia: Secondary | ICD-10-CM | POA: Diagnosis not present

## 2021-01-31 DIAGNOSIS — F3341 Major depressive disorder, recurrent, in partial remission: Secondary | ICD-10-CM | POA: Diagnosis not present

## 2021-01-31 DIAGNOSIS — N186 End stage renal disease: Secondary | ICD-10-CM | POA: Diagnosis not present

## 2021-01-31 DIAGNOSIS — E039 Hypothyroidism, unspecified: Secondary | ICD-10-CM | POA: Diagnosis not present

## 2021-01-31 DIAGNOSIS — I776 Arteritis, unspecified: Secondary | ICD-10-CM | POA: Diagnosis not present

## 2021-01-31 DIAGNOSIS — E785 Hyperlipidemia, unspecified: Secondary | ICD-10-CM | POA: Diagnosis not present

## 2021-02-01 DIAGNOSIS — N186 End stage renal disease: Secondary | ICD-10-CM | POA: Diagnosis not present

## 2021-02-01 DIAGNOSIS — N2581 Secondary hyperparathyroidism of renal origin: Secondary | ICD-10-CM | POA: Diagnosis not present

## 2021-02-01 DIAGNOSIS — Z992 Dependence on renal dialysis: Secondary | ICD-10-CM | POA: Diagnosis not present

## 2021-02-04 DIAGNOSIS — I509 Heart failure, unspecified: Secondary | ICD-10-CM | POA: Diagnosis not present

## 2021-02-04 DIAGNOSIS — I083 Combined rheumatic disorders of mitral, aortic and tricuspid valves: Secondary | ICD-10-CM | POA: Diagnosis not present

## 2021-02-04 DIAGNOSIS — E039 Hypothyroidism, unspecified: Secondary | ICD-10-CM | POA: Diagnosis not present

## 2021-02-04 DIAGNOSIS — J8410 Pulmonary fibrosis, unspecified: Secondary | ICD-10-CM | POA: Diagnosis not present

## 2021-02-04 DIAGNOSIS — Z992 Dependence on renal dialysis: Secondary | ICD-10-CM | POA: Diagnosis not present

## 2021-02-04 DIAGNOSIS — N186 End stage renal disease: Secondary | ICD-10-CM | POA: Diagnosis not present

## 2021-02-04 DIAGNOSIS — M1990 Unspecified osteoarthritis, unspecified site: Secondary | ICD-10-CM | POA: Diagnosis not present

## 2021-02-04 DIAGNOSIS — N2581 Secondary hyperparathyroidism of renal origin: Secondary | ICD-10-CM | POA: Diagnosis not present

## 2021-02-04 DIAGNOSIS — I132 Hypertensive heart and chronic kidney disease with heart failure and with stage 5 chronic kidney disease, or end stage renal disease: Secondary | ICD-10-CM | POA: Diagnosis not present

## 2021-02-04 DIAGNOSIS — J90 Pleural effusion, not elsewhere classified: Secondary | ICD-10-CM | POA: Diagnosis not present

## 2021-02-04 DIAGNOSIS — E43 Unspecified severe protein-calorie malnutrition: Secondary | ICD-10-CM | POA: Diagnosis not present

## 2021-02-05 DIAGNOSIS — I871 Compression of vein: Secondary | ICD-10-CM | POA: Diagnosis not present

## 2021-02-05 DIAGNOSIS — T82858A Stenosis of vascular prosthetic devices, implants and grafts, initial encounter: Secondary | ICD-10-CM | POA: Diagnosis not present

## 2021-02-05 DIAGNOSIS — N186 End stage renal disease: Secondary | ICD-10-CM | POA: Diagnosis not present

## 2021-02-05 DIAGNOSIS — Z992 Dependence on renal dialysis: Secondary | ICD-10-CM | POA: Diagnosis not present

## 2021-02-06 DIAGNOSIS — N2581 Secondary hyperparathyroidism of renal origin: Secondary | ICD-10-CM | POA: Diagnosis not present

## 2021-02-06 DIAGNOSIS — N186 End stage renal disease: Secondary | ICD-10-CM | POA: Diagnosis not present

## 2021-02-06 DIAGNOSIS — Z992 Dependence on renal dialysis: Secondary | ICD-10-CM | POA: Diagnosis not present

## 2021-02-07 DIAGNOSIS — I083 Combined rheumatic disorders of mitral, aortic and tricuspid valves: Secondary | ICD-10-CM | POA: Diagnosis not present

## 2021-02-07 DIAGNOSIS — I132 Hypertensive heart and chronic kidney disease with heart failure and with stage 5 chronic kidney disease, or end stage renal disease: Secondary | ICD-10-CM | POA: Diagnosis not present

## 2021-02-07 DIAGNOSIS — E039 Hypothyroidism, unspecified: Secondary | ICD-10-CM | POA: Diagnosis not present

## 2021-02-07 DIAGNOSIS — J90 Pleural effusion, not elsewhere classified: Secondary | ICD-10-CM | POA: Diagnosis not present

## 2021-02-07 DIAGNOSIS — N186 End stage renal disease: Secondary | ICD-10-CM | POA: Diagnosis not present

## 2021-02-07 DIAGNOSIS — E43 Unspecified severe protein-calorie malnutrition: Secondary | ICD-10-CM | POA: Diagnosis not present

## 2021-02-07 DIAGNOSIS — J8410 Pulmonary fibrosis, unspecified: Secondary | ICD-10-CM | POA: Diagnosis not present

## 2021-02-07 DIAGNOSIS — M1990 Unspecified osteoarthritis, unspecified site: Secondary | ICD-10-CM | POA: Diagnosis not present

## 2021-02-07 DIAGNOSIS — I509 Heart failure, unspecified: Secondary | ICD-10-CM | POA: Diagnosis not present

## 2021-02-08 DIAGNOSIS — Z992 Dependence on renal dialysis: Secondary | ICD-10-CM | POA: Diagnosis not present

## 2021-02-08 DIAGNOSIS — N186 End stage renal disease: Secondary | ICD-10-CM | POA: Diagnosis not present

## 2021-02-08 DIAGNOSIS — N2581 Secondary hyperparathyroidism of renal origin: Secondary | ICD-10-CM | POA: Diagnosis not present

## 2021-02-11 DIAGNOSIS — N2581 Secondary hyperparathyroidism of renal origin: Secondary | ICD-10-CM | POA: Diagnosis not present

## 2021-02-11 DIAGNOSIS — Z992 Dependence on renal dialysis: Secondary | ICD-10-CM | POA: Diagnosis not present

## 2021-02-11 DIAGNOSIS — N186 End stage renal disease: Secondary | ICD-10-CM | POA: Diagnosis not present

## 2021-02-12 DIAGNOSIS — N186 End stage renal disease: Secondary | ICD-10-CM | POA: Diagnosis not present

## 2021-02-12 DIAGNOSIS — M1990 Unspecified osteoarthritis, unspecified site: Secondary | ICD-10-CM | POA: Diagnosis not present

## 2021-02-12 DIAGNOSIS — J90 Pleural effusion, not elsewhere classified: Secondary | ICD-10-CM | POA: Diagnosis not present

## 2021-02-12 DIAGNOSIS — I509 Heart failure, unspecified: Secondary | ICD-10-CM | POA: Diagnosis not present

## 2021-02-12 DIAGNOSIS — E039 Hypothyroidism, unspecified: Secondary | ICD-10-CM | POA: Diagnosis not present

## 2021-02-12 DIAGNOSIS — I132 Hypertensive heart and chronic kidney disease with heart failure and with stage 5 chronic kidney disease, or end stage renal disease: Secondary | ICD-10-CM | POA: Diagnosis not present

## 2021-02-12 DIAGNOSIS — I083 Combined rheumatic disorders of mitral, aortic and tricuspid valves: Secondary | ICD-10-CM | POA: Diagnosis not present

## 2021-02-12 DIAGNOSIS — E43 Unspecified severe protein-calorie malnutrition: Secondary | ICD-10-CM | POA: Diagnosis not present

## 2021-02-12 DIAGNOSIS — J8410 Pulmonary fibrosis, unspecified: Secondary | ICD-10-CM | POA: Diagnosis not present

## 2021-02-13 DIAGNOSIS — E039 Hypothyroidism, unspecified: Secondary | ICD-10-CM | POA: Diagnosis not present

## 2021-02-13 DIAGNOSIS — J8410 Pulmonary fibrosis, unspecified: Secondary | ICD-10-CM | POA: Diagnosis not present

## 2021-02-13 DIAGNOSIS — Z992 Dependence on renal dialysis: Secondary | ICD-10-CM | POA: Diagnosis not present

## 2021-02-13 DIAGNOSIS — N2581 Secondary hyperparathyroidism of renal origin: Secondary | ICD-10-CM | POA: Diagnosis not present

## 2021-02-13 DIAGNOSIS — M1990 Unspecified osteoarthritis, unspecified site: Secondary | ICD-10-CM | POA: Diagnosis not present

## 2021-02-13 DIAGNOSIS — I132 Hypertensive heart and chronic kidney disease with heart failure and with stage 5 chronic kidney disease, or end stage renal disease: Secondary | ICD-10-CM | POA: Diagnosis not present

## 2021-02-13 DIAGNOSIS — I509 Heart failure, unspecified: Secondary | ICD-10-CM | POA: Diagnosis not present

## 2021-02-13 DIAGNOSIS — J90 Pleural effusion, not elsewhere classified: Secondary | ICD-10-CM | POA: Diagnosis not present

## 2021-02-13 DIAGNOSIS — I083 Combined rheumatic disorders of mitral, aortic and tricuspid valves: Secondary | ICD-10-CM | POA: Diagnosis not present

## 2021-02-13 DIAGNOSIS — N186 End stage renal disease: Secondary | ICD-10-CM | POA: Diagnosis not present

## 2021-02-13 DIAGNOSIS — E43 Unspecified severe protein-calorie malnutrition: Secondary | ICD-10-CM | POA: Diagnosis not present

## 2021-02-15 DIAGNOSIS — N186 End stage renal disease: Secondary | ICD-10-CM | POA: Diagnosis not present

## 2021-02-15 DIAGNOSIS — Z992 Dependence on renal dialysis: Secondary | ICD-10-CM | POA: Diagnosis not present

## 2021-02-15 DIAGNOSIS — N2581 Secondary hyperparathyroidism of renal origin: Secondary | ICD-10-CM | POA: Diagnosis not present

## 2021-02-18 DIAGNOSIS — Z992 Dependence on renal dialysis: Secondary | ICD-10-CM | POA: Diagnosis not present

## 2021-02-18 DIAGNOSIS — N186 End stage renal disease: Secondary | ICD-10-CM | POA: Diagnosis not present

## 2021-02-18 DIAGNOSIS — N2581 Secondary hyperparathyroidism of renal origin: Secondary | ICD-10-CM | POA: Diagnosis not present

## 2021-02-19 DIAGNOSIS — Z992 Dependence on renal dialysis: Secondary | ICD-10-CM | POA: Diagnosis not present

## 2021-02-19 DIAGNOSIS — J90 Pleural effusion, not elsewhere classified: Secondary | ICD-10-CM | POA: Diagnosis not present

## 2021-02-19 DIAGNOSIS — J8410 Pulmonary fibrosis, unspecified: Secondary | ICD-10-CM | POA: Diagnosis not present

## 2021-02-19 DIAGNOSIS — N186 End stage renal disease: Secondary | ICD-10-CM | POA: Diagnosis not present

## 2021-02-19 DIAGNOSIS — I083 Combined rheumatic disorders of mitral, aortic and tricuspid valves: Secondary | ICD-10-CM | POA: Diagnosis not present

## 2021-02-19 DIAGNOSIS — I132 Hypertensive heart and chronic kidney disease with heart failure and with stage 5 chronic kidney disease, or end stage renal disease: Secondary | ICD-10-CM | POA: Diagnosis not present

## 2021-02-19 DIAGNOSIS — N179 Acute kidney failure, unspecified: Secondary | ICD-10-CM | POA: Diagnosis not present

## 2021-02-19 DIAGNOSIS — M1990 Unspecified osteoarthritis, unspecified site: Secondary | ICD-10-CM | POA: Diagnosis not present

## 2021-02-19 DIAGNOSIS — E43 Unspecified severe protein-calorie malnutrition: Secondary | ICD-10-CM | POA: Diagnosis not present

## 2021-02-19 DIAGNOSIS — E039 Hypothyroidism, unspecified: Secondary | ICD-10-CM | POA: Diagnosis not present

## 2021-02-19 DIAGNOSIS — I509 Heart failure, unspecified: Secondary | ICD-10-CM | POA: Diagnosis not present

## 2021-02-20 DIAGNOSIS — I132 Hypertensive heart and chronic kidney disease with heart failure and with stage 5 chronic kidney disease, or end stage renal disease: Secondary | ICD-10-CM | POA: Diagnosis not present

## 2021-02-20 DIAGNOSIS — J90 Pleural effusion, not elsewhere classified: Secondary | ICD-10-CM | POA: Diagnosis not present

## 2021-02-20 DIAGNOSIS — N186 End stage renal disease: Secondary | ICD-10-CM | POA: Diagnosis not present

## 2021-02-20 DIAGNOSIS — Z992 Dependence on renal dialysis: Secondary | ICD-10-CM | POA: Diagnosis not present

## 2021-02-20 DIAGNOSIS — M1990 Unspecified osteoarthritis, unspecified site: Secondary | ICD-10-CM | POA: Diagnosis not present

## 2021-02-20 DIAGNOSIS — E43 Unspecified severe protein-calorie malnutrition: Secondary | ICD-10-CM | POA: Diagnosis not present

## 2021-02-20 DIAGNOSIS — I509 Heart failure, unspecified: Secondary | ICD-10-CM | POA: Diagnosis not present

## 2021-02-20 DIAGNOSIS — J8410 Pulmonary fibrosis, unspecified: Secondary | ICD-10-CM | POA: Diagnosis not present

## 2021-02-20 DIAGNOSIS — N2581 Secondary hyperparathyroidism of renal origin: Secondary | ICD-10-CM | POA: Diagnosis not present

## 2021-02-20 DIAGNOSIS — I083 Combined rheumatic disorders of mitral, aortic and tricuspid valves: Secondary | ICD-10-CM | POA: Diagnosis not present

## 2021-02-20 DIAGNOSIS — E039 Hypothyroidism, unspecified: Secondary | ICD-10-CM | POA: Diagnosis not present

## 2021-02-22 DIAGNOSIS — N2581 Secondary hyperparathyroidism of renal origin: Secondary | ICD-10-CM | POA: Diagnosis not present

## 2021-02-22 DIAGNOSIS — Z992 Dependence on renal dialysis: Secondary | ICD-10-CM | POA: Diagnosis not present

## 2021-02-22 DIAGNOSIS — N186 End stage renal disease: Secondary | ICD-10-CM | POA: Diagnosis not present

## 2021-02-25 DIAGNOSIS — Z992 Dependence on renal dialysis: Secondary | ICD-10-CM | POA: Diagnosis not present

## 2021-02-25 DIAGNOSIS — N186 End stage renal disease: Secondary | ICD-10-CM | POA: Diagnosis not present

## 2021-02-25 DIAGNOSIS — N2581 Secondary hyperparathyroidism of renal origin: Secondary | ICD-10-CM | POA: Diagnosis not present

## 2021-02-26 DIAGNOSIS — I132 Hypertensive heart and chronic kidney disease with heart failure and with stage 5 chronic kidney disease, or end stage renal disease: Secondary | ICD-10-CM | POA: Diagnosis not present

## 2021-02-26 DIAGNOSIS — J90 Pleural effusion, not elsewhere classified: Secondary | ICD-10-CM | POA: Diagnosis not present

## 2021-02-26 DIAGNOSIS — E43 Unspecified severe protein-calorie malnutrition: Secondary | ICD-10-CM | POA: Diagnosis not present

## 2021-02-26 DIAGNOSIS — J8410 Pulmonary fibrosis, unspecified: Secondary | ICD-10-CM | POA: Diagnosis not present

## 2021-02-26 DIAGNOSIS — M1990 Unspecified osteoarthritis, unspecified site: Secondary | ICD-10-CM | POA: Diagnosis not present

## 2021-02-26 DIAGNOSIS — E039 Hypothyroidism, unspecified: Secondary | ICD-10-CM | POA: Diagnosis not present

## 2021-02-26 DIAGNOSIS — I083 Combined rheumatic disorders of mitral, aortic and tricuspid valves: Secondary | ICD-10-CM | POA: Diagnosis not present

## 2021-02-26 DIAGNOSIS — N186 End stage renal disease: Secondary | ICD-10-CM | POA: Diagnosis not present

## 2021-02-26 DIAGNOSIS — I509 Heart failure, unspecified: Secondary | ICD-10-CM | POA: Diagnosis not present

## 2021-02-27 DIAGNOSIS — Z992 Dependence on renal dialysis: Secondary | ICD-10-CM | POA: Diagnosis not present

## 2021-02-27 DIAGNOSIS — I509 Heart failure, unspecified: Secondary | ICD-10-CM | POA: Diagnosis not present

## 2021-02-27 DIAGNOSIS — E43 Unspecified severe protein-calorie malnutrition: Secondary | ICD-10-CM | POA: Diagnosis not present

## 2021-02-27 DIAGNOSIS — I132 Hypertensive heart and chronic kidney disease with heart failure and with stage 5 chronic kidney disease, or end stage renal disease: Secondary | ICD-10-CM | POA: Diagnosis not present

## 2021-02-27 DIAGNOSIS — I083 Combined rheumatic disorders of mitral, aortic and tricuspid valves: Secondary | ICD-10-CM | POA: Diagnosis not present

## 2021-02-27 DIAGNOSIS — J90 Pleural effusion, not elsewhere classified: Secondary | ICD-10-CM | POA: Diagnosis not present

## 2021-02-27 DIAGNOSIS — N186 End stage renal disease: Secondary | ICD-10-CM | POA: Diagnosis not present

## 2021-02-27 DIAGNOSIS — J8410 Pulmonary fibrosis, unspecified: Secondary | ICD-10-CM | POA: Diagnosis not present

## 2021-02-27 DIAGNOSIS — N2581 Secondary hyperparathyroidism of renal origin: Secondary | ICD-10-CM | POA: Diagnosis not present

## 2021-02-27 DIAGNOSIS — M1990 Unspecified osteoarthritis, unspecified site: Secondary | ICD-10-CM | POA: Diagnosis not present

## 2021-02-27 DIAGNOSIS — E039 Hypothyroidism, unspecified: Secondary | ICD-10-CM | POA: Diagnosis not present

## 2021-03-01 ENCOUNTER — Ambulatory Visit: Payer: Medicare PPO | Admitting: Physician Assistant

## 2021-03-01 DIAGNOSIS — Z992 Dependence on renal dialysis: Secondary | ICD-10-CM | POA: Diagnosis not present

## 2021-03-01 DIAGNOSIS — N2581 Secondary hyperparathyroidism of renal origin: Secondary | ICD-10-CM | POA: Diagnosis not present

## 2021-03-01 DIAGNOSIS — N186 End stage renal disease: Secondary | ICD-10-CM | POA: Diagnosis not present

## 2021-03-04 DIAGNOSIS — N2581 Secondary hyperparathyroidism of renal origin: Secondary | ICD-10-CM | POA: Diagnosis not present

## 2021-03-04 DIAGNOSIS — Z992 Dependence on renal dialysis: Secondary | ICD-10-CM | POA: Diagnosis not present

## 2021-03-04 DIAGNOSIS — N186 End stage renal disease: Secondary | ICD-10-CM | POA: Diagnosis not present

## 2021-03-06 DIAGNOSIS — Z992 Dependence on renal dialysis: Secondary | ICD-10-CM | POA: Diagnosis not present

## 2021-03-06 DIAGNOSIS — N2581 Secondary hyperparathyroidism of renal origin: Secondary | ICD-10-CM | POA: Diagnosis not present

## 2021-03-06 DIAGNOSIS — N186 End stage renal disease: Secondary | ICD-10-CM | POA: Diagnosis not present

## 2021-03-06 NOTE — Progress Notes (Signed)
Cardiology Office Note    Date:  03/12/2021   ID:  Jasmine White, DOB 1945/12/29, MRN [id number]   PCP:  Leeroy Cha, [place]  Cardiologist:  Sherren Mocha, MD   Advanced Practice Provider:  No care team member to display Electrophysiologist:  None   [id number]   Chief Complaint  Patient presents with   Follow-up     History of Present Illness:  Jasmine White is a 75 y.o. female with history of ESRD on HD, positive ANCA interstitial lung disease, diffuse lower hemorrhage, moderate MR. She was healthy prior to developing covid-19 pneumonia 2020.  Patient discharged from the hospital 01/17/21 with new onset CHF elevated BNP 3681 moderate MR and LVEF 30 to 35% with global hypokinesis.  Right and left cardiac cath and showed essentially normal coronary arteries and normal filling pressures.  She did not appear to have a large V wave followed by echo she had mod- severe MR.  Optimizing medical therapy recommended.  Patient comes in accompanied by her husband. She tires easily. No chest pain, shortness of breath, edema. Able to drive herself to dialysis and working in the yard.  Trying to get on the kidney transplant list at [facility]. Was declined at [facility] because of her age.     Past Medical History:  Diagnosis Date   Arthritis    Bruises easily    Cancer ([place])    basal skin cancer   Chronic back pain    HNP/stenosis and radiculopathy   Hyperlipidemia    takes Pravastatin daily   Hypertension    takes Hyzaar and toprol daily   Hypothyroidism    takes Synthroid daily   Insomnia    takes Elevil nightly   Joint pain    Joint swelling    Low BP    past some sedation   Nocturia    PONV (postoperative nausea and vomiting)    with knee replacement b/p dropped   Sinusitis    finished zpak yesterday   Urinary frequency     Past Surgical History:  Procedure Laterality Date   A/V FISTULAGRAM N/A 05/17/2020   Procedure:  A/V FISTULAGRAM - Left Arm;  Surgeon: Marty Heck, MD;  Location: [facility];  Service: Cardiovascular;  Laterality: N/A;   ABDOMINAL HYSTERECTOMY     AV FISTULA PLACEMENT Left 04/02/2020   Procedure: ARTERIOVENOUS (AV) FISTULA CREATION;  Surgeon: Rosetta Posner, MD;  Location: [facility];  Service: Vascular;  Laterality: Left;   BACK SURGERY     basal cell skin cancer     on face and forehead   bil knee replacements     BREAST BIOPSY     left breast/benign   COLONOSCOPY     IR FLUORO GUIDE CV LINE RIGHT  03/20/2020   IR US GUIDE VASC ACCESS RIGHT  03/20/2020   LUMBAR LAMINECTOMY/DECOMPRESSION MICRODISCECTOMY  08/13/2012   Procedure: LUMBAR LAMINECTOMY/DECOMPRESSION MICRODISCECTOMY 1 LEVEL;  Surgeon: Erline Levine, MD;  Location: [facility];  Service: Neurosurgery;  Laterality: Left;  Left lumbar one-two Microdiskectomy   PERIPHERAL VASCULAR BALLOON ANGIOPLASTY Left 05/17/2020   Procedure: PERIPHERAL VASCULAR BALLOON ANGIOPLASTY;  Surgeon: Marty Heck, MD;  Location: [facility] [facility];  Service: Cardiovascular;  Laterality: Left;  Arm fistula    RIGHT/LEFT HEART CATH AND CORONARY ANGIOGRAPHY N/A 01/17/2021   Procedure: RIGHT/LEFT HEART CATH AND CORONARY ANGIOGRAPHY;  Surgeon: Lorretta Harp, MD;  Location: [place] [facility]  [facility];  Service: Cardiovascular;  Laterality: N/A;    Current Medications: Current Meds  Medication Sig   acetaminophen (TYLENOL) 500 MG tablet Take 500-1,000 mg by mouth daily as needed (for pain).   albuterol (VENTOLIN HFA) 108 (90 Base) MCG/ACT inhaler Inhale 2 puffs into the lungs every 6 (six) hours as needed for wheezing or shortness of breath.   b complex-vitamin c-folic acid (NEPHRO-VITE) 0.8 MG TABS tablet Take 1 tablet by mouth at bedtime.   carvedilol (COREG) 3.125 MG tablet Take 1 tablet (3.125 mg total) by mouth 2 (two) times daily with a meal.   escitalopram (LEXAPRO) 20 MG tablet Take 20 mg by mouth daily.   HYDROcodone-acetaminophen  (NORCO/VICODIN) 5-325 MG tablet Take 0.5 tablets by mouth at bedtime as needed (for pain).   hydrocortisone 1 % lotion Apply 1 application topically 2 (two) times daily as needed for itching.   loratadine (CLARITIN) 10 MG tablet Take 10 mg by mouth daily.   sevelamer carbonate (RENVELA) 800 MG tablet Take 800-3,600 mg by mouth See admin instructions. Take 3,600 mg by mouth with each meal and 800 mg with each snack   SYNTHROID 88 MCG tablet TAKE 1 TABLET BY MOUTH ONCE DAILY BEFORE BREAKFAST   traZODone (DESYREL) 50 MG tablet Take 1 tablet (50 mg total) by mouth at bedtime as needed for sleep.   zolpidem (AMBIEN) 5 MG tablet 1 tablet at bedtime.     Allergies:   Hctz [hydrochlorothiazide], Other, Percocet [oxycodone-acetaminophen], and Ultram [tramadol]   Social History   Socioeconomic History   Marital status: Married    Spouse name: Ray   Number of children: 2   Years of education: Not on file   Highest education level: Not on file  Occupational History   Occupation: retired  Tobacco Use   Smoking status: Never   Smokeless tobacco: Never  Vaping Use   Vaping Use: Never used  Substance and Sexual Activity   Alcohol use: No    Alcohol/week: 0.0 standard drinks   Drug use: No   Sexual activity: Not Currently  Other Topics Concern   Not on file  Social History Narrative   Not on file   Social Determinants of Health   Financial Resource Strain: Not on file  Food Insecurity: Not on file  Transportation Needs: Not on file  Physical Activity: Not on file  Stress: Not on file  Social Connections: Not on file     Family History:  The patient's  family history includes Alcohol abuse in her father; Arthritis in her brother and brother; Cancer (age of onset: 22) in her brother; Heart disease in her father and mother; Hypertension in her mother; Stroke in her father.   ROS:   Please see the history of present illness.    ROS All other systems reviewed and are  negative.   PHYSICAL EXAM:   VS:  BP 110/62   Pulse 76   Ht 5\' 6"  (1.676 m)   Wt 113 lb 9.6 oz (51.5 kg)   SpO2 96%   BMI 18.34 kg/m   Physical Exam  GEN: Thin, in no acute distress  Neck: no JVD, carotid bruits, or masses Cardiac:RRR; no murmurs, rubs, or gallops  Respiratory:  Decrease breath sounds with crackles on inspiration at the bases otherwise clear to auscultation bilaterally, normal work of breathing GI: soft, nontender, nondistended, + BS Ext: without cyanosis, clubbing, or edema, Good distal pulses bilaterally Neuro:  Alert and Oriented x 3, Psych: euthymic mood,  full affect  Wt Readings from Last 3 Encounters:  03/12/21 113 lb 9.6 oz (51.5 kg)  01/17/21 110 lb 0.2 oz (49.9 kg)  01/15/21 115 lb 3.2 oz (52.3 kg)      Studies/Labs Reviewed:   EKG:  EKG is not ordered today.    Recent Labs: 04/05/2020: B Natriuretic Peptide 2,852.3 05/02/2020: Magnesium 2.1 12/12/2020: Pro B Natriuretic peptide (BNP) 3,681.0 01/15/2021: ALT 25; Platelets 163; TSH 1.295 01/17/2021: BUN 12; Creatinine, Ser 2.55; Hemoglobin 10.9; Potassium 3.6; Sodium 138   Lipid Panel    Component Value Date/Time   CHOL 130 01/17/2021 1020   CHOL 173 03/01/2020 0927   CHOL 169 12/21/2012 1552   TRIG 84 01/17/2021 1020   TRIG 97 12/26/2014 1150   TRIG 64 12/21/2012 1552   HDL 60 01/17/2021 1020   HDL 67 03/01/2020 0927   HDL 75 12/26/2014 1150   HDL 63 12/21/2012 1552   CHOLHDL 2.2 01/17/2021 1020   VLDL 17 01/17/2021 1020   LDLCALC 53 01/17/2021 1020   LDLCALC 94 03/01/2020 0927   LDLCALC 102 (H) 07/06/2014 0912   LDLCALC 93 12/21/2012 1552    Additional studies/ records that were reviewed today include:  Right and left cardiac catheterization 01/17/2021  IMPRESSION: Ms. Berryhill has essentially normal coronary arteries and normal filling pressures.  She appears to be euvolemic.  She did not appear to have a large V wave although by echo she has severe mitral regurgitation.  Guideline  directed optimal medical therapy will be recommended.  She may be a candidate for MitraClip which Dr. Burt Knack will address.  The right common femoral vein had Mynx closure successfully.  The arterial sheath will be pulled manually given the fact that there is very little subcutaneous tissue.  The patient left lab in stable condition.   Quay Burow. MD, [facility] 01/17/2021 11:26 AM   2D echo 4/27/2022IMPRESSIONS     1. Left ventricular ejection fraction, by estimation, is 30 to 35%. The  left ventricle has moderately decreased function. The left ventricle  demonstrates global hypokinesis. The left ventricular internal cavity size  was mildly dilated. There is mild  left ventricular hypertrophy. Left ventricular diastolic parameters are  consistent with Grade II diastolic dysfunction (pseudonormalization).  Elevated left atrial pressure.   2. Right ventricular systolic function is normal. The right ventricular  size is normal. There is mildly elevated pulmonary artery systolic  pressure. The estimated right ventricular systolic pressure is 28.4 mmHg.   3. Left atrial size was severely dilated.   4. Right atrial size was mildly dilated.   5. The mitral valve is abnormal. Moderate to severe mitral valve  regurgitation.   6. Tricuspid valve regurgitation is moderate.   7. The aortic valve is tricuspid. Aortic valve regurgitation is moderate.  Mild to moderate aortic valve sclerosis/calcification is present, without  any evidence of aortic stenosis.   8. The inferior vena cava is normal in size with <50% respiratory  variability, suggesting right atrial pressure of 8 mmHg.      Risk Assessment/Calculations:         ASSESSMENT:    1. NICM (nonischemic cardiomyopathy) ([place])   2. Nonrheumatic mitral valve regurgitation   3. ESRD (end stage renal disease) ([place])   4. Essential hypertension, benign   5. Pulmonary fibrosis (H[facility])      PLAN:  In order of problems listed  above:  Cardiomyopathy ejection fraction 30 to 35% on echo 12/2020, cardiac cath normal coronary arteries and normal filling  pressures. Getting energy back. Breathing improved. Continue low dose coreg, can't increase because of BP drops on dialysis.  Moderate to severe MR on 2D echo 12/2020-recheck echo in 3 months  ESRD on HD -[facility], [facility] at [facility] because of age  Hypertension BP tends to run low.  Pulmonary fibrosis followed by pulmonary-continues with cough and has had effusions. Recommend she f/u with pulmonary.  Shared Decision Making/Informed Consent        Medication Adjustments/Labs and Tests Ordered: Current medicines are reviewed at length with the patient today.  Concerns regarding medicines are outlined above.  Medication changes, Labs and Tests ordered today are listed in the Patient Instructions below. Patient Instructions  Medication Instructions:  Your physician recommends that you continue on your current medications as directed. Please refer to the Current Medication list given to you today.  *If you need a refill on your cardiac medications before your next appointment, please call your pharmacy*   Lab Work: None If you have labs (blood work) drawn today and your tests are completely normal, you will receive your results only by: [place] (if you have MyChart) OR A paper copy in the mail If you have any lab test that is abnormal or we need to change your treatment, we will call you to review the results.   Testing/Procedures: Your physician has requested that you have an echocardiogram in 3 months. Echocardiography is a painless test that uses sound waves to create images of your heart. It provides your doctor with information about the size and shape of your heart and how well your heart's chambers and valves are working. This procedure takes approximately one hour. There are no restrictions for this  procedure.    Follow-Up: At [facility], you and your health needs are our priority.  As part of our continuing mission to provide you with exceptional heart care, we have created designated Provider Care Teams.  These Care Teams include your primary Cardiologist (physician) and Advanced Practice Providers (APPs -  Physician Assistants and Nurse Practitioners) who all work together to provide you with the care you need, when you need it.  Your next appointment:   4 month(s)  The format for your next appointment:   In Person  Provider:   Sherren Mocha, MD       Signed, Ermalinda Barrios, PA-C  03/12/2021 12:04 PM    [address], [place], [place]  [address] Phone: [phone]; Fax: [phone]

## 2021-03-08 DIAGNOSIS — N186 End stage renal disease: Secondary | ICD-10-CM | POA: Diagnosis not present

## 2021-03-08 DIAGNOSIS — Z992 Dependence on renal dialysis: Secondary | ICD-10-CM | POA: Diagnosis not present

## 2021-03-08 DIAGNOSIS — N2581 Secondary hyperparathyroidism of renal origin: Secondary | ICD-10-CM | POA: Diagnosis not present

## 2021-03-11 DIAGNOSIS — N2581 Secondary hyperparathyroidism of renal origin: Secondary | ICD-10-CM | POA: Diagnosis not present

## 2021-03-11 DIAGNOSIS — N186 End stage renal disease: Secondary | ICD-10-CM | POA: Diagnosis not present

## 2021-03-11 DIAGNOSIS — Z992 Dependence on renal dialysis: Secondary | ICD-10-CM | POA: Diagnosis not present

## 2021-03-12 ENCOUNTER — Ambulatory Visit: Payer: Medicare PPO | Admitting: Physician Assistant

## 2021-03-12 ENCOUNTER — Other Ambulatory Visit: Payer: Self-pay

## 2021-03-12 ENCOUNTER — Encounter: Payer: Self-pay | Admitting: Physician Assistant

## 2021-03-12 VITALS — BP 110/62 | HR 76 | Ht 66.0 in | Wt 113.6 lb

## 2021-03-12 DIAGNOSIS — N186 End stage renal disease: Secondary | ICD-10-CM

## 2021-03-12 DIAGNOSIS — I340 Nonrheumatic mitral (valve) insufficiency: Secondary | ICD-10-CM

## 2021-03-12 DIAGNOSIS — I428 Other cardiomyopathies: Secondary | ICD-10-CM | POA: Diagnosis not present

## 2021-03-12 DIAGNOSIS — I10 Essential (primary) hypertension: Secondary | ICD-10-CM

## 2021-03-12 DIAGNOSIS — J8410 Pulmonary fibrosis, unspecified: Secondary | ICD-10-CM | POA: Diagnosis not present

## 2021-03-12 NOTE — Patient Instructions (Signed)
Medication Instructions:  Your physician recommends that you continue on your current medications as directed. Please refer to the Current Medication list given to you today.  *If you need a refill on your cardiac medications before your next appointment, please call your pharmacy*   Lab Work: None If you have labs (blood work) drawn today and your tests are completely normal, you will receive your results only by: [place] (if you have MyChart) OR A paper copy in the mail If you have any lab test that is abnormal or we need to change your treatment, we will call you to review the results.   Testing/Procedures: Your physician has requested that you have an echocardiogram in 3 months. Echocardiography is a painless test that uses sound waves to create images of your heart. It provides your doctor with information about the size and shape of your heart and how well your heart's chambers and valves are working. This procedure takes approximately one hour. There are no restrictions for this procedure.    Follow-Up: At [facility], you and your health needs are our priority.  As part of our continuing mission to provide you with exceptional heart care, we have created designated Provider Care Teams.  These Care Teams include your primary Cardiologist (physician) and Advanced Practice Providers (APPs -  Physician Assistants and Nurse Practitioners) who all work together to provide you with the care you need, when you need it.  Your next appointment:   4 month(s)  The format for your next appointment:   In Person  Provider:   Sherren Mocha, MD

## 2021-03-13 DIAGNOSIS — N186 End stage renal disease: Secondary | ICD-10-CM | POA: Diagnosis not present

## 2021-03-13 DIAGNOSIS — Z992 Dependence on renal dialysis: Secondary | ICD-10-CM | POA: Diagnosis not present

## 2021-03-13 DIAGNOSIS — N2581 Secondary hyperparathyroidism of renal origin: Secondary | ICD-10-CM | POA: Diagnosis not present

## 2021-03-15 DIAGNOSIS — Z992 Dependence on renal dialysis: Secondary | ICD-10-CM | POA: Diagnosis not present

## 2021-03-15 DIAGNOSIS — N186 End stage renal disease: Secondary | ICD-10-CM | POA: Diagnosis not present

## 2021-03-15 DIAGNOSIS — N2581 Secondary hyperparathyroidism of renal origin: Secondary | ICD-10-CM | POA: Diagnosis not present

## 2021-03-18 DIAGNOSIS — N2581 Secondary hyperparathyroidism of renal origin: Secondary | ICD-10-CM | POA: Diagnosis not present

## 2021-03-18 DIAGNOSIS — N186 End stage renal disease: Secondary | ICD-10-CM | POA: Diagnosis not present

## 2021-03-18 DIAGNOSIS — Z992 Dependence on renal dialysis: Secondary | ICD-10-CM | POA: Diagnosis not present

## 2021-03-20 DIAGNOSIS — Z992 Dependence on renal dialysis: Secondary | ICD-10-CM | POA: Diagnosis not present

## 2021-03-20 DIAGNOSIS — N186 End stage renal disease: Secondary | ICD-10-CM | POA: Diagnosis not present

## 2021-03-20 DIAGNOSIS — N2581 Secondary hyperparathyroidism of renal origin: Secondary | ICD-10-CM | POA: Diagnosis not present

## 2021-03-21 DIAGNOSIS — N179 Acute kidney failure, unspecified: Secondary | ICD-10-CM | POA: Diagnosis not present

## 2021-03-21 DIAGNOSIS — N186 End stage renal disease: Secondary | ICD-10-CM | POA: Diagnosis not present

## 2021-03-21 DIAGNOSIS — Z992 Dependence on renal dialysis: Secondary | ICD-10-CM | POA: Diagnosis not present

## 2021-03-22 DIAGNOSIS — N2581 Secondary hyperparathyroidism of renal origin: Secondary | ICD-10-CM | POA: Diagnosis not present

## 2021-03-22 DIAGNOSIS — N186 End stage renal disease: Secondary | ICD-10-CM | POA: Diagnosis not present

## 2021-03-22 DIAGNOSIS — Z992 Dependence on renal dialysis: Secondary | ICD-10-CM | POA: Diagnosis not present

## 2021-03-25 DIAGNOSIS — Z992 Dependence on renal dialysis: Secondary | ICD-10-CM | POA: Diagnosis not present

## 2021-03-25 DIAGNOSIS — N2581 Secondary hyperparathyroidism of renal origin: Secondary | ICD-10-CM | POA: Diagnosis not present

## 2021-03-25 DIAGNOSIS — N186 End stage renal disease: Secondary | ICD-10-CM | POA: Diagnosis not present

## 2021-03-27 DIAGNOSIS — Z992 Dependence on renal dialysis: Secondary | ICD-10-CM | POA: Diagnosis not present

## 2021-03-27 DIAGNOSIS — N2581 Secondary hyperparathyroidism of renal origin: Secondary | ICD-10-CM | POA: Diagnosis not present

## 2021-03-27 DIAGNOSIS — N186 End stage renal disease: Secondary | ICD-10-CM | POA: Diagnosis not present

## 2021-03-29 DIAGNOSIS — Z992 Dependence on renal dialysis: Secondary | ICD-10-CM | POA: Diagnosis not present

## 2021-03-29 DIAGNOSIS — N186 End stage renal disease: Secondary | ICD-10-CM | POA: Diagnosis not present

## 2021-03-29 DIAGNOSIS — N2581 Secondary hyperparathyroidism of renal origin: Secondary | ICD-10-CM | POA: Diagnosis not present

## 2021-04-01 DIAGNOSIS — Z992 Dependence on renal dialysis: Secondary | ICD-10-CM | POA: Diagnosis not present

## 2021-04-01 DIAGNOSIS — N2581 Secondary hyperparathyroidism of renal origin: Secondary | ICD-10-CM | POA: Diagnosis not present

## 2021-04-01 DIAGNOSIS — N186 End stage renal disease: Secondary | ICD-10-CM | POA: Diagnosis not present

## 2021-04-03 DIAGNOSIS — Z992 Dependence on renal dialysis: Secondary | ICD-10-CM | POA: Diagnosis not present

## 2021-04-03 DIAGNOSIS — N2581 Secondary hyperparathyroidism of renal origin: Secondary | ICD-10-CM | POA: Diagnosis not present

## 2021-04-03 DIAGNOSIS — N186 End stage renal disease: Secondary | ICD-10-CM | POA: Diagnosis not present

## 2021-04-05 DIAGNOSIS — N2581 Secondary hyperparathyroidism of renal origin: Secondary | ICD-10-CM | POA: Diagnosis not present

## 2021-04-05 DIAGNOSIS — Z992 Dependence on renal dialysis: Secondary | ICD-10-CM | POA: Diagnosis not present

## 2021-04-05 DIAGNOSIS — N186 End stage renal disease: Secondary | ICD-10-CM | POA: Diagnosis not present

## 2021-04-08 DIAGNOSIS — Z992 Dependence on renal dialysis: Secondary | ICD-10-CM | POA: Diagnosis not present

## 2021-04-08 DIAGNOSIS — N2581 Secondary hyperparathyroidism of renal origin: Secondary | ICD-10-CM | POA: Diagnosis not present

## 2021-04-08 DIAGNOSIS — N186 End stage renal disease: Secondary | ICD-10-CM | POA: Diagnosis not present

## 2021-04-10 DIAGNOSIS — E039 Hypothyroidism, unspecified: Secondary | ICD-10-CM | POA: Diagnosis not present

## 2021-04-10 DIAGNOSIS — N2581 Secondary hyperparathyroidism of renal origin: Secondary | ICD-10-CM | POA: Diagnosis not present

## 2021-04-10 DIAGNOSIS — E785 Hyperlipidemia, unspecified: Secondary | ICD-10-CM | POA: Diagnosis not present

## 2021-04-10 DIAGNOSIS — I776 Arteritis, unspecified: Secondary | ICD-10-CM | POA: Diagnosis not present

## 2021-04-10 DIAGNOSIS — Z992 Dependence on renal dialysis: Secondary | ICD-10-CM | POA: Diagnosis not present

## 2021-04-10 DIAGNOSIS — Z0001 Encounter for general adult medical examination with abnormal findings: Secondary | ICD-10-CM | POA: Diagnosis not present

## 2021-04-10 DIAGNOSIS — F3341 Major depressive disorder, recurrent, in partial remission: Secondary | ICD-10-CM | POA: Diagnosis not present

## 2021-04-10 DIAGNOSIS — Z23 Encounter for immunization: Secondary | ICD-10-CM | POA: Diagnosis not present

## 2021-04-10 DIAGNOSIS — N186 End stage renal disease: Secondary | ICD-10-CM | POA: Diagnosis not present

## 2021-04-10 DIAGNOSIS — Z0000 Encounter for general adult medical examination without abnormal findings: Secondary | ICD-10-CM | POA: Diagnosis not present

## 2021-04-10 DIAGNOSIS — F5101 Primary insomnia: Secondary | ICD-10-CM | POA: Diagnosis not present

## 2021-04-11 DIAGNOSIS — L814 Other melanin hyperpigmentation: Secondary | ICD-10-CM | POA: Diagnosis not present

## 2021-04-11 DIAGNOSIS — L910 Hypertrophic scar: Secondary | ICD-10-CM | POA: Diagnosis not present

## 2021-04-11 DIAGNOSIS — D485 Neoplasm of uncertain behavior of skin: Secondary | ICD-10-CM | POA: Diagnosis not present

## 2021-04-11 DIAGNOSIS — Z85828 Personal history of other malignant neoplasm of skin: Secondary | ICD-10-CM | POA: Diagnosis not present

## 2021-04-11 DIAGNOSIS — L218 Other seborrheic dermatitis: Secondary | ICD-10-CM | POA: Diagnosis not present

## 2021-04-11 DIAGNOSIS — L570 Actinic keratosis: Secondary | ICD-10-CM | POA: Diagnosis not present

## 2021-04-11 DIAGNOSIS — D225 Melanocytic nevi of trunk: Secondary | ICD-10-CM | POA: Diagnosis not present

## 2021-04-11 DIAGNOSIS — C44519 Basal cell carcinoma of skin of other part of trunk: Secondary | ICD-10-CM | POA: Diagnosis not present

## 2021-04-12 DIAGNOSIS — N2581 Secondary hyperparathyroidism of renal origin: Secondary | ICD-10-CM | POA: Diagnosis not present

## 2021-04-12 DIAGNOSIS — Z992 Dependence on renal dialysis: Secondary | ICD-10-CM | POA: Diagnosis not present

## 2021-04-12 DIAGNOSIS — N186 End stage renal disease: Secondary | ICD-10-CM | POA: Diagnosis not present

## 2021-04-15 DIAGNOSIS — N186 End stage renal disease: Secondary | ICD-10-CM | POA: Diagnosis not present

## 2021-04-15 DIAGNOSIS — Z992 Dependence on renal dialysis: Secondary | ICD-10-CM | POA: Diagnosis not present

## 2021-04-15 DIAGNOSIS — N2581 Secondary hyperparathyroidism of renal origin: Secondary | ICD-10-CM | POA: Diagnosis not present

## 2021-04-16 ENCOUNTER — Other Ambulatory Visit: Payer: Self-pay | Admitting: Physician Assistant

## 2021-04-17 DIAGNOSIS — N186 End stage renal disease: Secondary | ICD-10-CM | POA: Diagnosis not present

## 2021-04-17 DIAGNOSIS — Z992 Dependence on renal dialysis: Secondary | ICD-10-CM | POA: Diagnosis not present

## 2021-04-17 DIAGNOSIS — N2581 Secondary hyperparathyroidism of renal origin: Secondary | ICD-10-CM | POA: Diagnosis not present

## 2021-04-19 DIAGNOSIS — Z992 Dependence on renal dialysis: Secondary | ICD-10-CM | POA: Diagnosis not present

## 2021-04-19 DIAGNOSIS — N2581 Secondary hyperparathyroidism of renal origin: Secondary | ICD-10-CM | POA: Diagnosis not present

## 2021-04-19 DIAGNOSIS — N186 End stage renal disease: Secondary | ICD-10-CM | POA: Diagnosis not present

## 2021-04-21 DIAGNOSIS — Z992 Dependence on renal dialysis: Secondary | ICD-10-CM | POA: Diagnosis not present

## 2021-04-21 DIAGNOSIS — N186 End stage renal disease: Secondary | ICD-10-CM | POA: Diagnosis not present

## 2021-04-21 DIAGNOSIS — N179 Acute kidney failure, unspecified: Secondary | ICD-10-CM | POA: Diagnosis not present

## 2021-04-22 DIAGNOSIS — N2581 Secondary hyperparathyroidism of renal origin: Secondary | ICD-10-CM | POA: Diagnosis not present

## 2021-04-22 DIAGNOSIS — Z992 Dependence on renal dialysis: Secondary | ICD-10-CM | POA: Diagnosis not present

## 2021-04-22 DIAGNOSIS — N186 End stage renal disease: Secondary | ICD-10-CM | POA: Diagnosis not present

## 2021-04-24 DIAGNOSIS — N186 End stage renal disease: Secondary | ICD-10-CM | POA: Diagnosis not present

## 2021-04-24 DIAGNOSIS — N2581 Secondary hyperparathyroidism of renal origin: Secondary | ICD-10-CM | POA: Diagnosis not present

## 2021-04-24 DIAGNOSIS — Z992 Dependence on renal dialysis: Secondary | ICD-10-CM | POA: Diagnosis not present

## 2021-04-25 DIAGNOSIS — I776 Arteritis, unspecified: Secondary | ICD-10-CM | POA: Diagnosis not present

## 2021-04-25 DIAGNOSIS — N186 End stage renal disease: Secondary | ICD-10-CM | POA: Diagnosis not present

## 2021-04-25 DIAGNOSIS — Z992 Dependence on renal dialysis: Secondary | ICD-10-CM | POA: Diagnosis not present

## 2021-04-25 DIAGNOSIS — Z01818 Encounter for other preprocedural examination: Secondary | ICD-10-CM | POA: Diagnosis not present

## 2021-04-25 DIAGNOSIS — E785 Hyperlipidemia, unspecified: Secondary | ICD-10-CM | POA: Diagnosis not present

## 2021-04-25 DIAGNOSIS — I10 Essential (primary) hypertension: Secondary | ICD-10-CM | POA: Diagnosis not present

## 2021-04-26 DIAGNOSIS — Z992 Dependence on renal dialysis: Secondary | ICD-10-CM | POA: Diagnosis not present

## 2021-04-26 DIAGNOSIS — N186 End stage renal disease: Secondary | ICD-10-CM | POA: Diagnosis not present

## 2021-04-26 DIAGNOSIS — N2581 Secondary hyperparathyroidism of renal origin: Secondary | ICD-10-CM | POA: Diagnosis not present

## 2021-04-29 DIAGNOSIS — Z992 Dependence on renal dialysis: Secondary | ICD-10-CM | POA: Diagnosis not present

## 2021-04-29 DIAGNOSIS — N186 End stage renal disease: Secondary | ICD-10-CM | POA: Diagnosis not present

## 2021-04-29 DIAGNOSIS — N2581 Secondary hyperparathyroidism of renal origin: Secondary | ICD-10-CM | POA: Diagnosis not present

## 2021-05-01 DIAGNOSIS — N186 End stage renal disease: Secondary | ICD-10-CM | POA: Diagnosis not present

## 2021-05-01 DIAGNOSIS — N2581 Secondary hyperparathyroidism of renal origin: Secondary | ICD-10-CM | POA: Diagnosis not present

## 2021-05-01 DIAGNOSIS — Z992 Dependence on renal dialysis: Secondary | ICD-10-CM | POA: Diagnosis not present

## 2021-05-03 DIAGNOSIS — N2581 Secondary hyperparathyroidism of renal origin: Secondary | ICD-10-CM | POA: Diagnosis not present

## 2021-05-03 DIAGNOSIS — Z992 Dependence on renal dialysis: Secondary | ICD-10-CM | POA: Diagnosis not present

## 2021-05-03 DIAGNOSIS — N186 End stage renal disease: Secondary | ICD-10-CM | POA: Diagnosis not present

## 2021-05-06 DIAGNOSIS — N186 End stage renal disease: Secondary | ICD-10-CM | POA: Diagnosis not present

## 2021-05-06 DIAGNOSIS — N2581 Secondary hyperparathyroidism of renal origin: Secondary | ICD-10-CM | POA: Diagnosis not present

## 2021-05-06 DIAGNOSIS — Z992 Dependence on renal dialysis: Secondary | ICD-10-CM | POA: Diagnosis not present

## 2021-05-08 DIAGNOSIS — Z992 Dependence on renal dialysis: Secondary | ICD-10-CM | POA: Diagnosis not present

## 2021-05-08 DIAGNOSIS — N2581 Secondary hyperparathyroidism of renal origin: Secondary | ICD-10-CM | POA: Diagnosis not present

## 2021-05-08 DIAGNOSIS — N186 End stage renal disease: Secondary | ICD-10-CM | POA: Diagnosis not present

## 2021-05-10 DIAGNOSIS — N2581 Secondary hyperparathyroidism of renal origin: Secondary | ICD-10-CM | POA: Diagnosis not present

## 2021-05-10 DIAGNOSIS — Z992 Dependence on renal dialysis: Secondary | ICD-10-CM | POA: Diagnosis not present

## 2021-05-10 DIAGNOSIS — N186 End stage renal disease: Secondary | ICD-10-CM | POA: Diagnosis not present

## 2021-05-13 DIAGNOSIS — Z992 Dependence on renal dialysis: Secondary | ICD-10-CM | POA: Diagnosis not present

## 2021-05-13 DIAGNOSIS — N186 End stage renal disease: Secondary | ICD-10-CM | POA: Diagnosis not present

## 2021-05-13 DIAGNOSIS — N2581 Secondary hyperparathyroidism of renal origin: Secondary | ICD-10-CM | POA: Diagnosis not present

## 2021-05-15 DIAGNOSIS — N2581 Secondary hyperparathyroidism of renal origin: Secondary | ICD-10-CM | POA: Diagnosis not present

## 2021-05-15 DIAGNOSIS — N186 End stage renal disease: Secondary | ICD-10-CM | POA: Diagnosis not present

## 2021-05-15 DIAGNOSIS — Z992 Dependence on renal dialysis: Secondary | ICD-10-CM | POA: Diagnosis not present

## 2021-05-17 DIAGNOSIS — Z992 Dependence on renal dialysis: Secondary | ICD-10-CM | POA: Diagnosis not present

## 2021-05-17 DIAGNOSIS — N2581 Secondary hyperparathyroidism of renal origin: Secondary | ICD-10-CM | POA: Diagnosis not present

## 2021-05-17 DIAGNOSIS — N186 End stage renal disease: Secondary | ICD-10-CM | POA: Diagnosis not present

## 2021-05-20 DIAGNOSIS — N2581 Secondary hyperparathyroidism of renal origin: Secondary | ICD-10-CM | POA: Diagnosis not present

## 2021-05-20 DIAGNOSIS — Z992 Dependence on renal dialysis: Secondary | ICD-10-CM | POA: Diagnosis not present

## 2021-05-20 DIAGNOSIS — N186 End stage renal disease: Secondary | ICD-10-CM | POA: Diagnosis not present

## 2021-05-22 DIAGNOSIS — N179 Acute kidney failure, unspecified: Secondary | ICD-10-CM | POA: Diagnosis not present

## 2021-05-22 DIAGNOSIS — N186 End stage renal disease: Secondary | ICD-10-CM | POA: Diagnosis not present

## 2021-05-22 DIAGNOSIS — Z992 Dependence on renal dialysis: Secondary | ICD-10-CM | POA: Diagnosis not present

## 2021-05-22 DIAGNOSIS — N2581 Secondary hyperparathyroidism of renal origin: Secondary | ICD-10-CM | POA: Diagnosis not present

## 2021-05-24 DIAGNOSIS — N186 End stage renal disease: Secondary | ICD-10-CM | POA: Diagnosis not present

## 2021-05-24 DIAGNOSIS — N2581 Secondary hyperparathyroidism of renal origin: Secondary | ICD-10-CM | POA: Diagnosis not present

## 2021-05-24 DIAGNOSIS — Z992 Dependence on renal dialysis: Secondary | ICD-10-CM | POA: Diagnosis not present

## 2021-05-27 DIAGNOSIS — N2581 Secondary hyperparathyroidism of renal origin: Secondary | ICD-10-CM | POA: Diagnosis not present

## 2021-05-27 DIAGNOSIS — N186 End stage renal disease: Secondary | ICD-10-CM | POA: Diagnosis not present

## 2021-05-27 DIAGNOSIS — Z992 Dependence on renal dialysis: Secondary | ICD-10-CM | POA: Diagnosis not present

## 2021-05-29 DIAGNOSIS — N2581 Secondary hyperparathyroidism of renal origin: Secondary | ICD-10-CM | POA: Diagnosis not present

## 2021-05-29 DIAGNOSIS — Z992 Dependence on renal dialysis: Secondary | ICD-10-CM | POA: Diagnosis not present

## 2021-05-29 DIAGNOSIS — N186 End stage renal disease: Secondary | ICD-10-CM | POA: Diagnosis not present

## 2021-05-31 DIAGNOSIS — Z992 Dependence on renal dialysis: Secondary | ICD-10-CM | POA: Diagnosis not present

## 2021-05-31 DIAGNOSIS — N2581 Secondary hyperparathyroidism of renal origin: Secondary | ICD-10-CM | POA: Diagnosis not present

## 2021-05-31 DIAGNOSIS — N186 End stage renal disease: Secondary | ICD-10-CM | POA: Diagnosis not present

## 2021-06-03 DIAGNOSIS — Z992 Dependence on renal dialysis: Secondary | ICD-10-CM | POA: Diagnosis not present

## 2021-06-03 DIAGNOSIS — N186 End stage renal disease: Secondary | ICD-10-CM | POA: Diagnosis not present

## 2021-06-03 DIAGNOSIS — N2581 Secondary hyperparathyroidism of renal origin: Secondary | ICD-10-CM | POA: Diagnosis not present

## 2021-06-04 DIAGNOSIS — Z992 Dependence on renal dialysis: Secondary | ICD-10-CM | POA: Diagnosis not present

## 2021-06-04 DIAGNOSIS — N186 End stage renal disease: Secondary | ICD-10-CM | POA: Diagnosis not present

## 2021-06-04 DIAGNOSIS — I871 Compression of vein: Secondary | ICD-10-CM | POA: Diagnosis not present

## 2021-06-05 DIAGNOSIS — Z992 Dependence on renal dialysis: Secondary | ICD-10-CM | POA: Diagnosis not present

## 2021-06-05 DIAGNOSIS — N2581 Secondary hyperparathyroidism of renal origin: Secondary | ICD-10-CM | POA: Diagnosis not present

## 2021-06-05 DIAGNOSIS — N186 End stage renal disease: Secondary | ICD-10-CM | POA: Diagnosis not present

## 2021-06-06 DIAGNOSIS — Z7682 Awaiting organ transplant status: Secondary | ICD-10-CM | POA: Diagnosis not present

## 2021-06-06 DIAGNOSIS — Z79899 Other long term (current) drug therapy: Secondary | ICD-10-CM | POA: Diagnosis not present

## 2021-06-06 DIAGNOSIS — Z992 Dependence on renal dialysis: Secondary | ICD-10-CM | POA: Diagnosis not present

## 2021-06-06 DIAGNOSIS — I428 Other cardiomyopathies: Secondary | ICD-10-CM | POA: Diagnosis not present

## 2021-06-06 DIAGNOSIS — Z01810 Encounter for preprocedural cardiovascular examination: Secondary | ICD-10-CM | POA: Diagnosis not present

## 2021-06-06 DIAGNOSIS — N186 End stage renal disease: Secondary | ICD-10-CM | POA: Diagnosis not present

## 2021-06-07 DIAGNOSIS — Z992 Dependence on renal dialysis: Secondary | ICD-10-CM | POA: Diagnosis not present

## 2021-06-07 DIAGNOSIS — N2581 Secondary hyperparathyroidism of renal origin: Secondary | ICD-10-CM | POA: Diagnosis not present

## 2021-06-07 DIAGNOSIS — N186 End stage renal disease: Secondary | ICD-10-CM | POA: Diagnosis not present

## 2021-06-10 DIAGNOSIS — N186 End stage renal disease: Secondary | ICD-10-CM | POA: Diagnosis not present

## 2021-06-10 DIAGNOSIS — Z992 Dependence on renal dialysis: Secondary | ICD-10-CM | POA: Diagnosis not present

## 2021-06-10 DIAGNOSIS — N2581 Secondary hyperparathyroidism of renal origin: Secondary | ICD-10-CM | POA: Diagnosis not present

## 2021-06-12 DIAGNOSIS — Z992 Dependence on renal dialysis: Secondary | ICD-10-CM | POA: Diagnosis not present

## 2021-06-12 DIAGNOSIS — N186 End stage renal disease: Secondary | ICD-10-CM | POA: Diagnosis not present

## 2021-06-12 DIAGNOSIS — N2581 Secondary hyperparathyroidism of renal origin: Secondary | ICD-10-CM | POA: Diagnosis not present

## 2021-06-13 ENCOUNTER — Other Ambulatory Visit: Payer: Self-pay

## 2021-06-13 ENCOUNTER — Ambulatory Visit (HOSPITAL_COMMUNITY): Payer: Medicare PPO | Attending: Physician Assistant

## 2021-06-13 DIAGNOSIS — I428 Other cardiomyopathies: Secondary | ICD-10-CM | POA: Diagnosis not present

## 2021-06-13 DIAGNOSIS — I340 Nonrheumatic mitral (valve) insufficiency: Secondary | ICD-10-CM | POA: Diagnosis not present

## 2021-06-13 LAB — ECHOCARDIOGRAM COMPLETE
Area-P 1/2: 3.59 cm2
P 1/2 time: 517 msec
S' Lateral: 3.3 cm

## 2021-06-14 DIAGNOSIS — N186 End stage renal disease: Secondary | ICD-10-CM | POA: Diagnosis not present

## 2021-06-14 DIAGNOSIS — N2581 Secondary hyperparathyroidism of renal origin: Secondary | ICD-10-CM | POA: Diagnosis not present

## 2021-06-14 DIAGNOSIS — Z992 Dependence on renal dialysis: Secondary | ICD-10-CM | POA: Diagnosis not present

## 2021-06-17 DIAGNOSIS — N186 End stage renal disease: Secondary | ICD-10-CM | POA: Diagnosis not present

## 2021-06-17 DIAGNOSIS — Z992 Dependence on renal dialysis: Secondary | ICD-10-CM | POA: Diagnosis not present

## 2021-06-17 DIAGNOSIS — N2581 Secondary hyperparathyroidism of renal origin: Secondary | ICD-10-CM | POA: Diagnosis not present

## 2021-06-19 DIAGNOSIS — N2581 Secondary hyperparathyroidism of renal origin: Secondary | ICD-10-CM | POA: Diagnosis not present

## 2021-06-19 DIAGNOSIS — Z992 Dependence on renal dialysis: Secondary | ICD-10-CM | POA: Diagnosis not present

## 2021-06-19 DIAGNOSIS — N186 End stage renal disease: Secondary | ICD-10-CM | POA: Diagnosis not present

## 2021-06-21 DIAGNOSIS — N2581 Secondary hyperparathyroidism of renal origin: Secondary | ICD-10-CM | POA: Diagnosis not present

## 2021-06-21 DIAGNOSIS — N179 Acute kidney failure, unspecified: Secondary | ICD-10-CM | POA: Diagnosis not present

## 2021-06-21 DIAGNOSIS — Z992 Dependence on renal dialysis: Secondary | ICD-10-CM | POA: Diagnosis not present

## 2021-06-21 DIAGNOSIS — N186 End stage renal disease: Secondary | ICD-10-CM | POA: Diagnosis not present

## 2021-06-24 DIAGNOSIS — N2581 Secondary hyperparathyroidism of renal origin: Secondary | ICD-10-CM | POA: Diagnosis not present

## 2021-06-24 DIAGNOSIS — N186 End stage renal disease: Secondary | ICD-10-CM | POA: Diagnosis not present

## 2021-06-24 DIAGNOSIS — Z992 Dependence on renal dialysis: Secondary | ICD-10-CM | POA: Diagnosis not present

## 2021-06-26 DIAGNOSIS — N2581 Secondary hyperparathyroidism of renal origin: Secondary | ICD-10-CM | POA: Diagnosis not present

## 2021-06-26 DIAGNOSIS — Z992 Dependence on renal dialysis: Secondary | ICD-10-CM | POA: Diagnosis not present

## 2021-06-26 DIAGNOSIS — N186 End stage renal disease: Secondary | ICD-10-CM | POA: Diagnosis not present

## 2021-06-28 DIAGNOSIS — N186 End stage renal disease: Secondary | ICD-10-CM | POA: Diagnosis not present

## 2021-06-28 DIAGNOSIS — Z992 Dependence on renal dialysis: Secondary | ICD-10-CM | POA: Diagnosis not present

## 2021-06-28 DIAGNOSIS — N2581 Secondary hyperparathyroidism of renal origin: Secondary | ICD-10-CM | POA: Diagnosis not present

## 2021-07-01 DIAGNOSIS — N186 End stage renal disease: Secondary | ICD-10-CM | POA: Diagnosis not present

## 2021-07-01 DIAGNOSIS — Z992 Dependence on renal dialysis: Secondary | ICD-10-CM | POA: Diagnosis not present

## 2021-07-01 DIAGNOSIS — N2581 Secondary hyperparathyroidism of renal origin: Secondary | ICD-10-CM | POA: Diagnosis not present

## 2021-07-03 DIAGNOSIS — N186 End stage renal disease: Secondary | ICD-10-CM | POA: Diagnosis not present

## 2021-07-03 DIAGNOSIS — N2581 Secondary hyperparathyroidism of renal origin: Secondary | ICD-10-CM | POA: Diagnosis not present

## 2021-07-03 DIAGNOSIS — Z992 Dependence on renal dialysis: Secondary | ICD-10-CM | POA: Diagnosis not present

## 2021-07-05 DIAGNOSIS — N186 End stage renal disease: Secondary | ICD-10-CM | POA: Diagnosis not present

## 2021-07-05 DIAGNOSIS — Z992 Dependence on renal dialysis: Secondary | ICD-10-CM | POA: Diagnosis not present

## 2021-07-05 DIAGNOSIS — N2581 Secondary hyperparathyroidism of renal origin: Secondary | ICD-10-CM | POA: Diagnosis not present

## 2021-07-08 DIAGNOSIS — Z992 Dependence on renal dialysis: Secondary | ICD-10-CM | POA: Diagnosis not present

## 2021-07-08 DIAGNOSIS — N186 End stage renal disease: Secondary | ICD-10-CM | POA: Diagnosis not present

## 2021-07-08 DIAGNOSIS — N2581 Secondary hyperparathyroidism of renal origin: Secondary | ICD-10-CM | POA: Diagnosis not present

## 2021-07-10 DIAGNOSIS — Z992 Dependence on renal dialysis: Secondary | ICD-10-CM | POA: Diagnosis not present

## 2021-07-10 DIAGNOSIS — N186 End stage renal disease: Secondary | ICD-10-CM | POA: Diagnosis not present

## 2021-07-10 DIAGNOSIS — N2581 Secondary hyperparathyroidism of renal origin: Secondary | ICD-10-CM | POA: Diagnosis not present

## 2021-07-12 DIAGNOSIS — N186 End stage renal disease: Secondary | ICD-10-CM | POA: Diagnosis not present

## 2021-07-12 DIAGNOSIS — Z992 Dependence on renal dialysis: Secondary | ICD-10-CM | POA: Diagnosis not present

## 2021-07-12 DIAGNOSIS — N2581 Secondary hyperparathyroidism of renal origin: Secondary | ICD-10-CM | POA: Diagnosis not present

## 2021-07-15 DIAGNOSIS — Z992 Dependence on renal dialysis: Secondary | ICD-10-CM | POA: Diagnosis not present

## 2021-07-15 DIAGNOSIS — N2581 Secondary hyperparathyroidism of renal origin: Secondary | ICD-10-CM | POA: Diagnosis not present

## 2021-07-15 DIAGNOSIS — N186 End stage renal disease: Secondary | ICD-10-CM | POA: Diagnosis not present

## 2021-07-16 DIAGNOSIS — H52203 Unspecified astigmatism, bilateral: Secondary | ICD-10-CM | POA: Diagnosis not present

## 2021-07-16 DIAGNOSIS — H353131 Nonexudative age-related macular degeneration, bilateral, early dry stage: Secondary | ICD-10-CM | POA: Diagnosis not present

## 2021-07-17 ENCOUNTER — Ambulatory Visit: Payer: Medicare PPO | Admitting: Pulmonary Disease

## 2021-07-17 DIAGNOSIS — N186 End stage renal disease: Secondary | ICD-10-CM | POA: Diagnosis not present

## 2021-07-17 DIAGNOSIS — N2581 Secondary hyperparathyroidism of renal origin: Secondary | ICD-10-CM | POA: Diagnosis not present

## 2021-07-17 DIAGNOSIS — Z992 Dependence on renal dialysis: Secondary | ICD-10-CM | POA: Diagnosis not present

## 2021-07-19 DIAGNOSIS — Z992 Dependence on renal dialysis: Secondary | ICD-10-CM | POA: Diagnosis not present

## 2021-07-19 DIAGNOSIS — N186 End stage renal disease: Secondary | ICD-10-CM | POA: Diagnosis not present

## 2021-07-19 DIAGNOSIS — N2581 Secondary hyperparathyroidism of renal origin: Secondary | ICD-10-CM | POA: Diagnosis not present

## 2021-07-22 DIAGNOSIS — N2581 Secondary hyperparathyroidism of renal origin: Secondary | ICD-10-CM | POA: Diagnosis not present

## 2021-07-22 DIAGNOSIS — N186 End stage renal disease: Secondary | ICD-10-CM | POA: Diagnosis not present

## 2021-07-22 DIAGNOSIS — Z992 Dependence on renal dialysis: Secondary | ICD-10-CM | POA: Diagnosis not present

## 2021-07-22 DIAGNOSIS — N179 Acute kidney failure, unspecified: Secondary | ICD-10-CM | POA: Diagnosis not present

## 2021-07-24 DIAGNOSIS — N186 End stage renal disease: Secondary | ICD-10-CM | POA: Diagnosis not present

## 2021-07-24 DIAGNOSIS — N2581 Secondary hyperparathyroidism of renal origin: Secondary | ICD-10-CM | POA: Diagnosis not present

## 2021-07-24 DIAGNOSIS — Z992 Dependence on renal dialysis: Secondary | ICD-10-CM | POA: Diagnosis not present

## 2021-07-26 DIAGNOSIS — N186 End stage renal disease: Secondary | ICD-10-CM | POA: Diagnosis not present

## 2021-07-26 DIAGNOSIS — N2581 Secondary hyperparathyroidism of renal origin: Secondary | ICD-10-CM | POA: Diagnosis not present

## 2021-07-26 DIAGNOSIS — Z992 Dependence on renal dialysis: Secondary | ICD-10-CM | POA: Diagnosis not present

## 2021-07-29 DIAGNOSIS — Z992 Dependence on renal dialysis: Secondary | ICD-10-CM | POA: Diagnosis not present

## 2021-07-29 DIAGNOSIS — N2581 Secondary hyperparathyroidism of renal origin: Secondary | ICD-10-CM | POA: Diagnosis not present

## 2021-07-29 DIAGNOSIS — N186 End stage renal disease: Secondary | ICD-10-CM | POA: Diagnosis not present

## 2021-07-31 DIAGNOSIS — Z992 Dependence on renal dialysis: Secondary | ICD-10-CM | POA: Diagnosis not present

## 2021-07-31 DIAGNOSIS — N2581 Secondary hyperparathyroidism of renal origin: Secondary | ICD-10-CM | POA: Diagnosis not present

## 2021-07-31 DIAGNOSIS — N186 End stage renal disease: Secondary | ICD-10-CM | POA: Diagnosis not present

## 2021-08-02 DIAGNOSIS — N2581 Secondary hyperparathyroidism of renal origin: Secondary | ICD-10-CM | POA: Diagnosis not present

## 2021-08-02 DIAGNOSIS — Z992 Dependence on renal dialysis: Secondary | ICD-10-CM | POA: Diagnosis not present

## 2021-08-02 DIAGNOSIS — N186 End stage renal disease: Secondary | ICD-10-CM | POA: Diagnosis not present

## 2021-08-05 ENCOUNTER — Encounter: Payer: Self-pay | Admitting: Pulmonary Disease

## 2021-08-05 ENCOUNTER — Other Ambulatory Visit: Payer: Self-pay

## 2021-08-05 ENCOUNTER — Ambulatory Visit: Payer: Medicare PPO | Admitting: Pulmonary Disease

## 2021-08-05 VITALS — BP 114/68 | HR 85 | Temp 98.4°F | Ht 66.0 in | Wt 114.2 lb

## 2021-08-05 DIAGNOSIS — Z992 Dependence on renal dialysis: Secondary | ICD-10-CM | POA: Diagnosis not present

## 2021-08-05 DIAGNOSIS — N189 Chronic kidney disease, unspecified: Secondary | ICD-10-CM

## 2021-08-05 DIAGNOSIS — I7782 Antineutrophilic cytoplasmic antibody (ANCA) vasculitis: Secondary | ICD-10-CM

## 2021-08-05 DIAGNOSIS — N059 Unspecified nephritic syndrome with unspecified morphologic changes: Secondary | ICD-10-CM | POA: Diagnosis not present

## 2021-08-05 DIAGNOSIS — I5022 Chronic systolic (congestive) heart failure: Secondary | ICD-10-CM

## 2021-08-05 DIAGNOSIS — I340 Nonrheumatic mitral (valve) insufficiency: Secondary | ICD-10-CM

## 2021-08-05 DIAGNOSIS — R0489 Hemorrhage from other sites in respiratory passages: Secondary | ICD-10-CM | POA: Diagnosis not present

## 2021-08-05 DIAGNOSIS — J849 Interstitial pulmonary disease, unspecified: Secondary | ICD-10-CM | POA: Diagnosis not present

## 2021-08-05 DIAGNOSIS — N186 End stage renal disease: Secondary | ICD-10-CM | POA: Diagnosis not present

## 2021-08-05 DIAGNOSIS — N2581 Secondary hyperparathyroidism of renal origin: Secondary | ICD-10-CM | POA: Diagnosis not present

## 2021-08-05 NOTE — Progress Notes (Signed)
[facility]  Chief Complaint  Patient presents with   Follow-up    Has not used oxygen since June 2022. Had fluid removed and feels that her breathing has improved significantly since her last visit. States she has been able to walk 2 miles and ride her elliptical for 60 min with no breathing issues since last OV.     Constitutional:  BP 114/68 (BP Location: Right Arm, Patient Position: Sitting)   Pulse 85   Temp 98.4 F (36.9 C)   Ht 5\' 6"  (1.676 m)   Wt 114 lb 3.2 oz (51.8 kg)   SpO2 99% Comment: ra  BMI 18.43 kg/m   Past Medical History:  Chronic back pain, HTN, Hypothyroidism, CKD, HLD, COVID 11 August 2019  Past Surgical History:  Her  has a past surgical history that includes Back surgery; bil knee replacements; basal cell skin cancer; Lumbar laminectomy/decompression microdiscectomy (08/13/2012); Abdominal hysterectomy; Colonoscopy; IR US Guide Vasc Access Right (03/20/2020); IR Fluoro Guide CV Line Right (03/20/2020); AV fistula placement (Left, 04/02/2020); A/V Fistulagram (N/A, 05/17/2020); PERIPHERAL VASCULAR BALLOON ANGIOPLASTY (Left, 05/17/2020); Breast biopsy; and RIGHT/LEFT HEART CATH AND CORONARY ANGIOGRAPHY (N/A, 01/17/2021).  Brief Summary:  Jasmine White is a 75 y.o. 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      Subjective:   She was in hospital in April 2022 with acute pulmonary edema with new onset systolic CHF and mitral regurgitation.  LHC didn't show significant coronary artery disease.  Her follow up Echo in September showed improvement in EF compared to April.  Her breathing is doing much better.  She exercises for at least an hour daily.  She is not having cough, wheeze, sputum, or hemoptysis.  She not longer is using inhalers or supplemental oxygen.  No issues breathing when asleep.  She switched transplant  teams from [facility] to [facility] in [place].  She has appointment with transplant surgeon scheduled, and will be getting donor organ from her daughter!  CXR from 04/25/21 showed apical scarring.  ANCA and Anti GBM Ab from 04/25/21 were both negative.  Quantiferon gold from 04/25/21 negative.   Physical Exam:   Appearance - well kempt   ENMT - no sinus tenderness, no oral exudate, no LAN, Mallampati 3 airway, no stridor  Respiratory - equal breath sounds bilaterally, no wheezing or rales  CV - s1s2 regular rate and rhythm, 2/6 SM  Ext - no clubbing, no edema  Skin - no rashes  Psych - normal mood and affect     Serology:  ANCA 03/18/20 >> MPO 10.3 (nl 0 - 9) GBM Ab 03/18/20 >> 137 (nl 0 - 20) ANA 6/27 >> positive, ds DNA Ab 50 (nl < 5) GMB Ab 04/03/20 >> 55  Pulmonary testing:  Rt thoracentesis 04/05/20 >> 900 cc straw colored fluid, glucose 133, protein < 3, LDH 50, WBC 63 (72%M) Bronchoscopy 04/05/20 >> WBC 125 (68%N) PFT 07/24/20 >> FEV1 1.71 (74%), FEV1% 84, TLC 3.92 (73%), DLCO 59%  Chest Imaging:  V/Q scan 03/22/20 >> negative CT chest 03/24/20 >> mosaic areas of GGO and consolidation, mod b/l effusions CT chest 04/05/20 >> large b/l effusions with compressive ATX, extensive ASD HRCT 04/16/20 >> mod Rt and small Lt effusions, extensive/dense consolidation HRCT chest 08/07/20 >> resolution of previous ASD and b/l effusions; mild irregular peripheral interstitial opacity throughout the lungs with a slight apical to basal gradient HRCT  chest 12/17/20 >> Rt greater than Lt pleural effusion, air trapping, atelectasis  Cardiac Tests:  Echo 06/13/21 >> EF 40 to 45%, mild LA dilation, mod MR, mod AR  Social History:  She  reports that she has never smoked. She has never used smokeless tobacco. She reports that she does not drink alcohol and does not use drugs.  Family History:  Her family history includes Alcohol abuse in her father; Arthritis in her brother and brother; Cancer (age of  onset: 72) in her brother; Heart disease in her father and mother; Hypertension in her mother; Stroke in her father.     Assessment/Plan:   Interstitial lung disease with Diffuse Alveolar Hemorrhage in July 2021. - in setting of ANCA, GBM positive rapidly progressive glomerulonephritis - no evidence for recurrence on most recent CT chest - monitor clinically  Chronic systolic CHF with mitral valve regurgitation. - followed by Dr. Sherren Mocha with [facility] in setting of Rapidly Progressive Glomerulonephritis. - followed by Dr. Marval Regal with [facility] - treated with rituximab in Summer of 2021 - she is followed by transplant team at [facility] in [place], and will be getting donor organ from her daughter  Time Spent Involved in Patient Care on Day of Examination:  32 minutes  Follow up:   Patient Instructions  Follow up in 6 months  Medication List:   Allergies as of 08/05/2021       Reactions   Hctz [hydrochlorothiazide] Other (See Comments)   Hx of severe Hyponatremia while taking HCTZ - now contraindicated   Other Other (See Comments)   If ever given "strong" pain medication, the patient will require something to counteract the nausea   Percocet [oxycodone-acetaminophen] Nausea Only   Ultram [tramadol] Nausea Only, Other (See Comments)   Patient remarked this made her "feel crazy" (also)        Medication List        Accurate as of August 05, 2021 11:41 AM. If you have any questions, ask your nurse or doctor.          STOP taking these medications    hydrocortisone 1 % lotion Stopped by: Chesley Mires, MD   traZODone 50 MG tablet Commonly known as: DESYREL Stopped by: Chesley Mires, MD       TAKE these medications    acetaminophen 500 MG tablet Commonly known as: TYLENOL Take 500-1,000 mg by mouth daily as needed (for pain).   albuterol 108 (90 Base) MCG/ACT inhaler Commonly known as: VENTOLIN HFA Inhale 2 puffs into the  lungs every 6 (six) hours as needed for wheezing or shortness of breath.   b complex-vitamin c-folic acid 0.8 MG Tabs tablet Take 1 tablet by mouth at bedtime.   carvedilol 3.125 MG tablet Commonly known as: COREG TAKE 1 TABLET (3.125 MG TOTAL) BY MOUTH 2 (TWO) TIMES DAILY WITH A MEAL.   escitalopram 20 MG tablet Commonly known as: LEXAPRO Take 20 mg by mouth daily.   HYDROcodone-acetaminophen 5-325 MG tablet Commonly known as: NORCO/VICODIN Take 0.5 tablets by mouth at bedtime as needed (for pain).   loratadine 10 MG tablet Commonly known as: CLARITIN Take 10 mg by mouth daily.   sevelamer carbonate 800 MG tablet Commonly known as: RENVELA Take 800-3,600 mg by mouth See admin instructions. Take 3,600 mg by mouth with each meal and 800 mg with each snack   Synthroid 88 MCG tablet Generic drug: levothyroxine TAKE 1 TABLET BY MOUTH ONCE DAILY BEFORE BREAKFAST  zolpidem 5 MG tablet Commonly known as: AMBIEN 1 tablet at bedtime.        Signature:  Chesley Mires, MD [place] Pager - [phone] 08/05/2021, 11:41 AM

## 2021-08-05 NOTE — Patient Instructions (Signed)
Follow up in 6 months 

## 2021-08-08 DIAGNOSIS — Z992 Dependence on renal dialysis: Secondary | ICD-10-CM | POA: Diagnosis not present

## 2021-08-08 DIAGNOSIS — N186 End stage renal disease: Secondary | ICD-10-CM | POA: Diagnosis not present

## 2021-08-08 DIAGNOSIS — N2581 Secondary hyperparathyroidism of renal origin: Secondary | ICD-10-CM | POA: Diagnosis not present

## 2021-08-09 DIAGNOSIS — N2581 Secondary hyperparathyroidism of renal origin: Secondary | ICD-10-CM | POA: Diagnosis not present

## 2021-08-09 DIAGNOSIS — Z992 Dependence on renal dialysis: Secondary | ICD-10-CM | POA: Diagnosis not present

## 2021-08-09 DIAGNOSIS — N186 End stage renal disease: Secondary | ICD-10-CM | POA: Diagnosis not present

## 2021-08-12 DIAGNOSIS — Z992 Dependence on renal dialysis: Secondary | ICD-10-CM | POA: Diagnosis not present

## 2021-08-12 DIAGNOSIS — N186 End stage renal disease: Secondary | ICD-10-CM | POA: Diagnosis not present

## 2021-08-12 DIAGNOSIS — N2581 Secondary hyperparathyroidism of renal origin: Secondary | ICD-10-CM | POA: Diagnosis not present

## 2021-08-14 DIAGNOSIS — Z992 Dependence on renal dialysis: Secondary | ICD-10-CM | POA: Diagnosis not present

## 2021-08-14 DIAGNOSIS — N2581 Secondary hyperparathyroidism of renal origin: Secondary | ICD-10-CM | POA: Diagnosis not present

## 2021-08-14 DIAGNOSIS — N186 End stage renal disease: Secondary | ICD-10-CM | POA: Diagnosis not present

## 2021-08-17 DIAGNOSIS — N186 End stage renal disease: Secondary | ICD-10-CM | POA: Diagnosis not present

## 2021-08-17 DIAGNOSIS — N2581 Secondary hyperparathyroidism of renal origin: Secondary | ICD-10-CM | POA: Diagnosis not present

## 2021-08-17 DIAGNOSIS — Z992 Dependence on renal dialysis: Secondary | ICD-10-CM | POA: Diagnosis not present

## 2021-08-19 DIAGNOSIS — N186 End stage renal disease: Secondary | ICD-10-CM | POA: Diagnosis not present

## 2021-08-19 DIAGNOSIS — N2581 Secondary hyperparathyroidism of renal origin: Secondary | ICD-10-CM | POA: Diagnosis not present

## 2021-08-19 DIAGNOSIS — Z992 Dependence on renal dialysis: Secondary | ICD-10-CM | POA: Diagnosis not present

## 2021-08-20 DIAGNOSIS — Z992 Dependence on renal dialysis: Secondary | ICD-10-CM | POA: Diagnosis not present

## 2021-08-20 DIAGNOSIS — N186 End stage renal disease: Secondary | ICD-10-CM | POA: Diagnosis not present

## 2021-08-20 DIAGNOSIS — I871 Compression of vein: Secondary | ICD-10-CM | POA: Diagnosis not present

## 2021-08-20 DIAGNOSIS — T82858A Stenosis of vascular prosthetic devices, implants and grafts, initial encounter: Secondary | ICD-10-CM | POA: Diagnosis not present

## 2021-08-21 DIAGNOSIS — Z992 Dependence on renal dialysis: Secondary | ICD-10-CM | POA: Diagnosis not present

## 2021-08-21 DIAGNOSIS — N2581 Secondary hyperparathyroidism of renal origin: Secondary | ICD-10-CM | POA: Diagnosis not present

## 2021-08-21 DIAGNOSIS — N186 End stage renal disease: Secondary | ICD-10-CM | POA: Diagnosis not present

## 2021-08-21 DIAGNOSIS — N179 Acute kidney failure, unspecified: Secondary | ICD-10-CM | POA: Diagnosis not present

## 2021-08-22 ENCOUNTER — Other Ambulatory Visit: Payer: Self-pay | Admitting: Internal Medicine

## 2021-08-22 DIAGNOSIS — Z1231 Encounter for screening mammogram for malignant neoplasm of breast: Secondary | ICD-10-CM

## 2021-08-23 DIAGNOSIS — N186 End stage renal disease: Secondary | ICD-10-CM | POA: Diagnosis not present

## 2021-08-23 DIAGNOSIS — Z992 Dependence on renal dialysis: Secondary | ICD-10-CM | POA: Diagnosis not present

## 2021-08-23 DIAGNOSIS — N2581 Secondary hyperparathyroidism of renal origin: Secondary | ICD-10-CM | POA: Diagnosis not present

## 2021-08-26 DIAGNOSIS — N2581 Secondary hyperparathyroidism of renal origin: Secondary | ICD-10-CM | POA: Diagnosis not present

## 2021-08-26 DIAGNOSIS — Z992 Dependence on renal dialysis: Secondary | ICD-10-CM | POA: Diagnosis not present

## 2021-08-26 DIAGNOSIS — N186 End stage renal disease: Secondary | ICD-10-CM | POA: Diagnosis not present

## 2021-08-28 DIAGNOSIS — N186 End stage renal disease: Secondary | ICD-10-CM | POA: Diagnosis not present

## 2021-08-28 DIAGNOSIS — Z992 Dependence on renal dialysis: Secondary | ICD-10-CM | POA: Diagnosis not present

## 2021-08-28 DIAGNOSIS — N2581 Secondary hyperparathyroidism of renal origin: Secondary | ICD-10-CM | POA: Diagnosis not present

## 2021-08-28 IMAGING — CT CT CHEST HIGH RESOLUTION W/O CM
2 of 3 series · 15 of 36 positions shown, 18 images · non-contrast
Comparison: 08/07/2020, 03/17/2020

CLINICAL DATA: Interstitial lung disease

EXAM:
CT CHEST WITHOUT CONTRAST
TECHNIQUE: Multidetector CT imaging of the chest was performed following the
standard protocol without intravenous contrast. High resolution
imaging of the lungs, as well as inspiratory and expiratory imaging,
was performed.

[Series 3: standard chest · axial · 0.58mm/px · z∈[-68,+190]mm · 12 of 153 slices shown, 15 images]
[im 12/153  mediastinal]
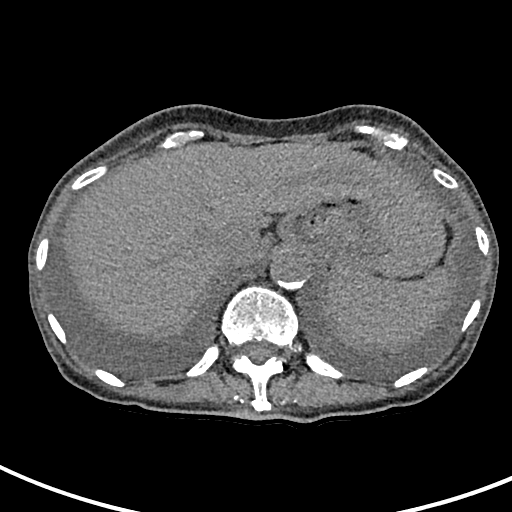
[im 12/153  lung]
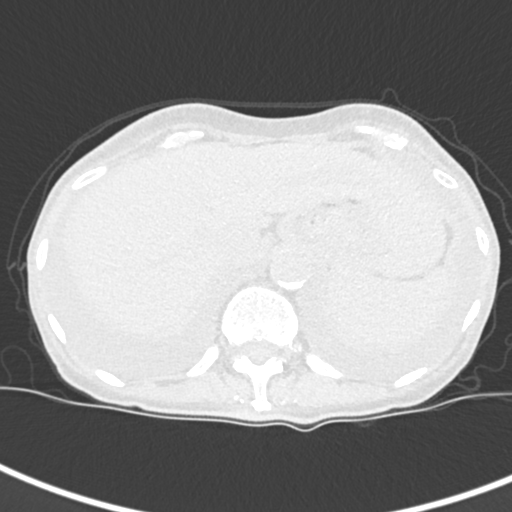
[im 23/153  lung]
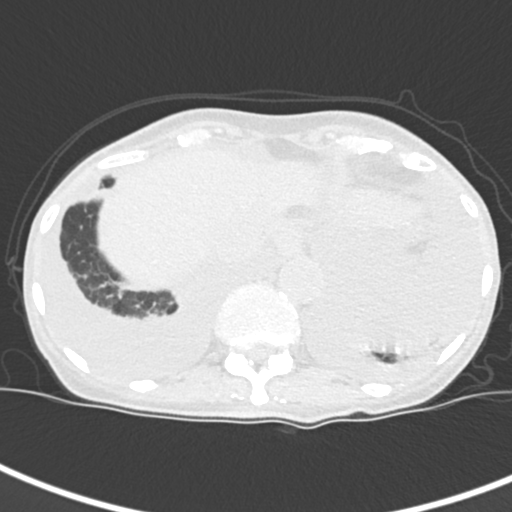
[im 34/153  lung]
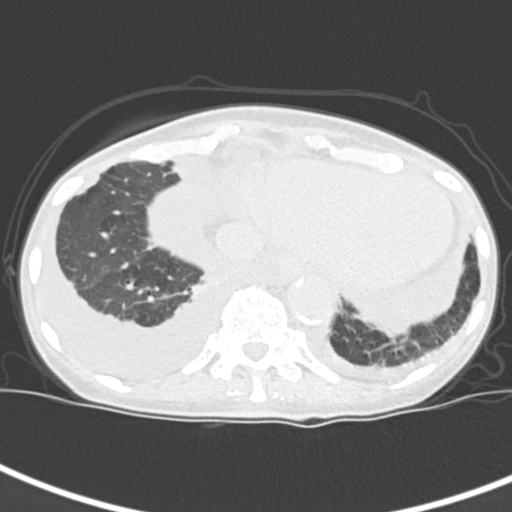
[im 46/153  lung]
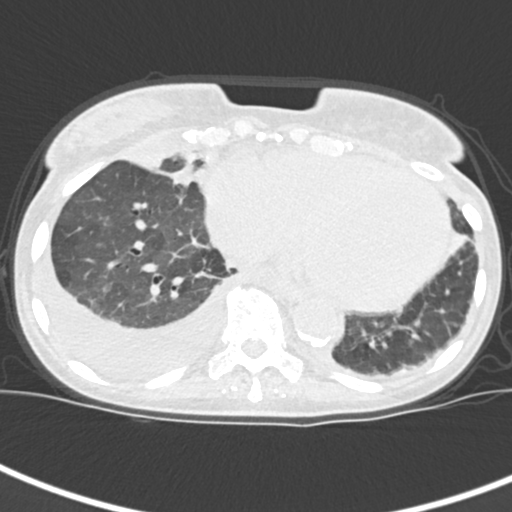
[im 57/153  mediastinal]
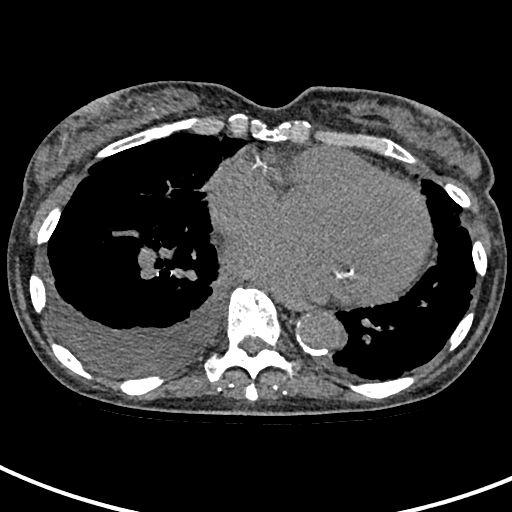
[im 57/153  lung]
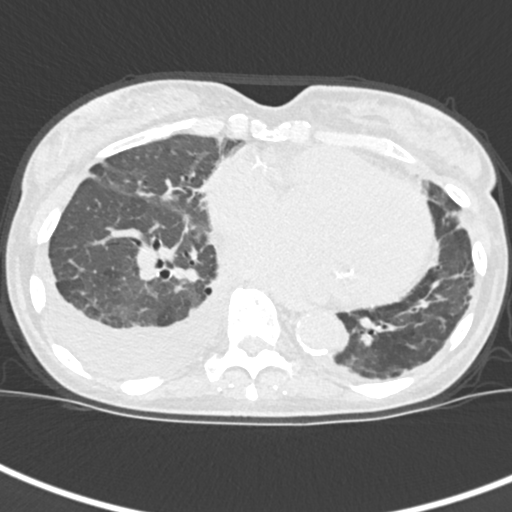
[im 68/153  lung]
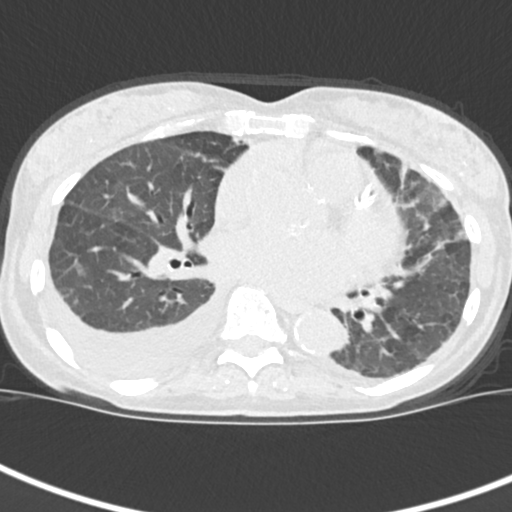
[im 85/153  lung]
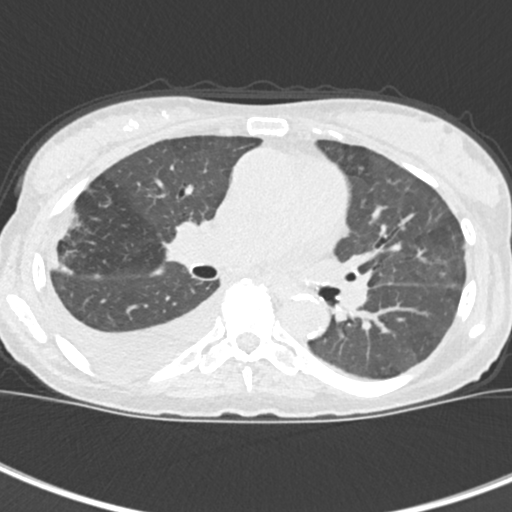
[im 96/153  lung]
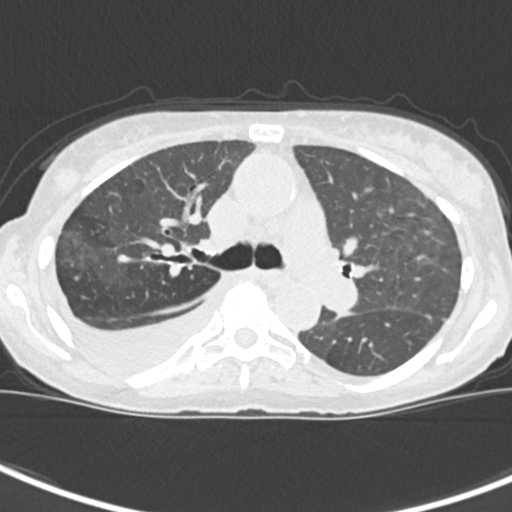
[im 107/153  mediastinal]
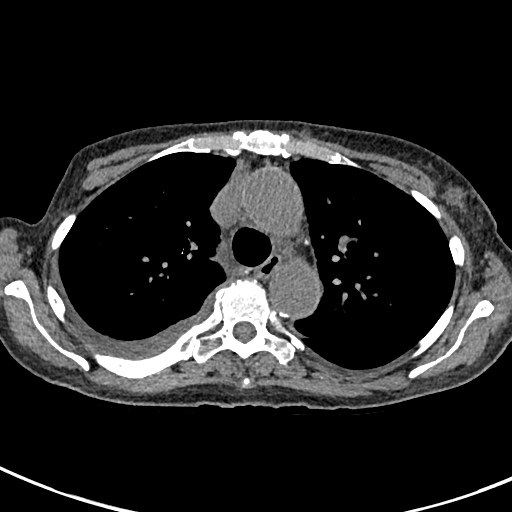
[im 107/153  lung]
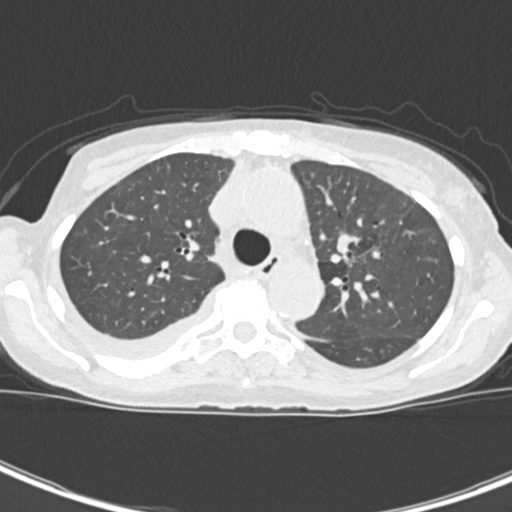
[im 119/153  lung]
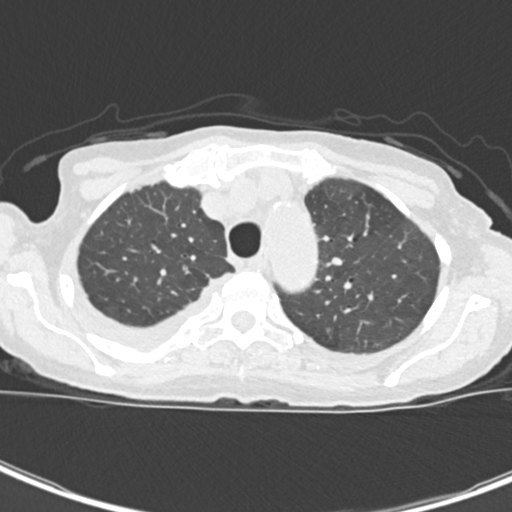
[im 130/153  lung]
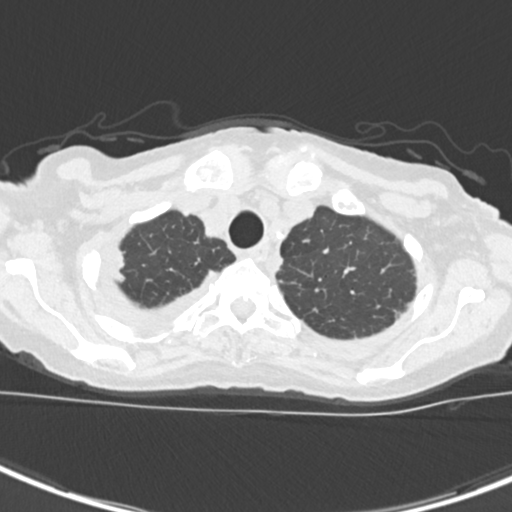
[im 141/153  lung]
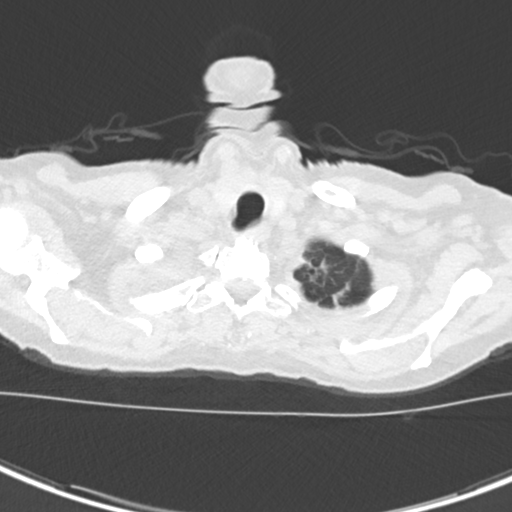

[Series 9: coronal · coronal · 0.65mm/px · 3 of 106 slices shown]
[im 22/106  lung]
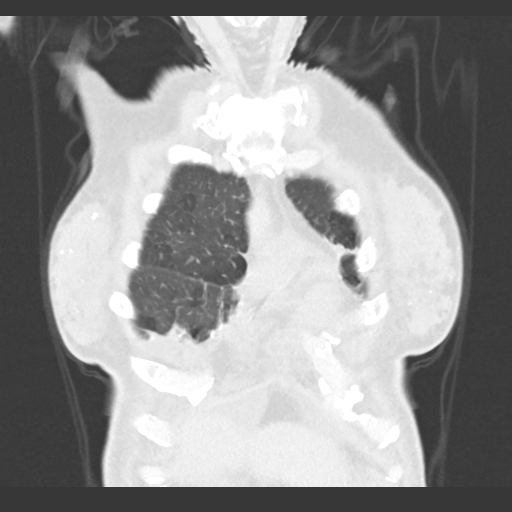
[im 43/106  lung]
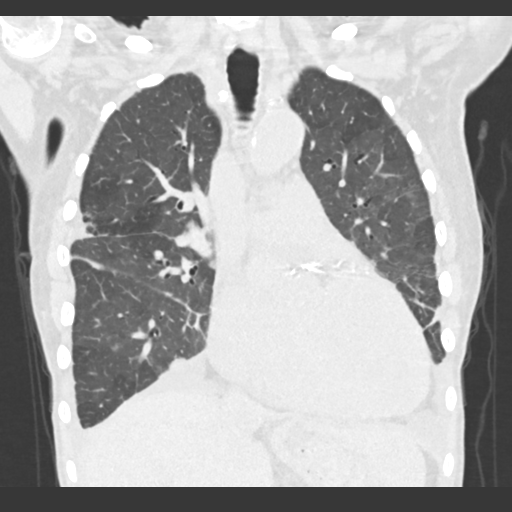
[im 64/106  lung]
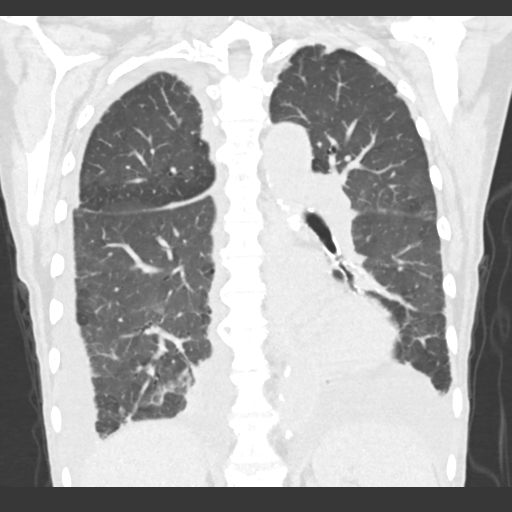

[15 of 36 positions shown; findings below may reference images not displayed]

FINDINGS: Cardiovascular: Aortic atherosclerosis. Cardiomegaly. Extensive 3
vessel coronary artery calcifications and/or stents. No pericardial
effusion.

Mediastinum/Nodes: No enlarged mediastinal, hilar, or axillary lymph
nodes. Thyroid gland, trachea, and esophagus demonstrate no
significant findings.

Lungs/Pleura: Moderate right, small left pleural effusions and
associated atelectasis or consolidation, which very substantially
limit assessment for interstitial lung disease. Within this
limitation, there is no obvious change in mild irregular peripheral
interstitial opacity seen on prior examination. Lobular air trapping
on expiratory phase imaging.

Upper Abdomen: No acute abnormality.

Musculoskeletal: No chest wall mass or suspicious bone lesions
identified.
IMPRESSION: 1. Moderate right, small left pleural effusions and associated
atelectasis or consolidation, which very substantially limit
assessment for interstitial lung disease.
2. Within this limitation, there is no obvious change in mild
irregular peripheral interstitial opacity seen on prior examination.
As on prior examination, generally favor bland sequelae of prior
infection or inflammation, although ongoing ILD CT surveillance for
developing fibrosis can be considered based on clinical concern.
3. Lobular air trapping on expiratory phase imaging, consistent with
small airways disease.
4. Cardiomegaly and coronary artery disease.

Aortic Atherosclerosis ([id number]-[id number]).

## 2021-08-30 DIAGNOSIS — N2581 Secondary hyperparathyroidism of renal origin: Secondary | ICD-10-CM | POA: Diagnosis not present

## 2021-08-30 DIAGNOSIS — N186 End stage renal disease: Secondary | ICD-10-CM | POA: Diagnosis not present

## 2021-08-30 DIAGNOSIS — Z992 Dependence on renal dialysis: Secondary | ICD-10-CM | POA: Diagnosis not present

## 2021-09-02 DIAGNOSIS — N186 End stage renal disease: Secondary | ICD-10-CM | POA: Diagnosis not present

## 2021-09-02 DIAGNOSIS — N2581 Secondary hyperparathyroidism of renal origin: Secondary | ICD-10-CM | POA: Diagnosis not present

## 2021-09-02 DIAGNOSIS — Z992 Dependence on renal dialysis: Secondary | ICD-10-CM | POA: Diagnosis not present

## 2021-09-03 DIAGNOSIS — K5730 Diverticulosis of large intestine without perforation or abscess without bleeding: Secondary | ICD-10-CM | POA: Diagnosis not present

## 2021-09-03 DIAGNOSIS — R195 Other fecal abnormalities: Secondary | ICD-10-CM | POA: Diagnosis not present

## 2021-09-03 DIAGNOSIS — K8020 Calculus of gallbladder without cholecystitis without obstruction: Secondary | ICD-10-CM | POA: Diagnosis not present

## 2021-09-03 DIAGNOSIS — D1771 Benign lipomatous neoplasm of kidney: Secondary | ICD-10-CM | POA: Diagnosis not present

## 2021-09-04 DIAGNOSIS — Z992 Dependence on renal dialysis: Secondary | ICD-10-CM | POA: Diagnosis not present

## 2021-09-04 DIAGNOSIS — N2581 Secondary hyperparathyroidism of renal origin: Secondary | ICD-10-CM | POA: Diagnosis not present

## 2021-09-04 DIAGNOSIS — N186 End stage renal disease: Secondary | ICD-10-CM | POA: Diagnosis not present

## 2021-09-06 DIAGNOSIS — N186 End stage renal disease: Secondary | ICD-10-CM | POA: Diagnosis not present

## 2021-09-06 DIAGNOSIS — Z992 Dependence on renal dialysis: Secondary | ICD-10-CM | POA: Diagnosis not present

## 2021-09-06 DIAGNOSIS — N2581 Secondary hyperparathyroidism of renal origin: Secondary | ICD-10-CM | POA: Diagnosis not present

## 2021-09-09 DIAGNOSIS — Z992 Dependence on renal dialysis: Secondary | ICD-10-CM | POA: Diagnosis not present

## 2021-09-09 DIAGNOSIS — N2581 Secondary hyperparathyroidism of renal origin: Secondary | ICD-10-CM | POA: Diagnosis not present

## 2021-09-09 DIAGNOSIS — N186 End stage renal disease: Secondary | ICD-10-CM | POA: Diagnosis not present

## 2021-09-10 ENCOUNTER — Ambulatory Visit: Payer: Medicare PPO | Admitting: Physician Assistant

## 2021-09-11 ENCOUNTER — Other Ambulatory Visit: Payer: Self-pay | Admitting: Physician Assistant

## 2021-09-11 DIAGNOSIS — N186 End stage renal disease: Secondary | ICD-10-CM | POA: Diagnosis not present

## 2021-09-11 DIAGNOSIS — N2581 Secondary hyperparathyroidism of renal origin: Secondary | ICD-10-CM | POA: Diagnosis not present

## 2021-09-11 DIAGNOSIS — Z992 Dependence on renal dialysis: Secondary | ICD-10-CM | POA: Diagnosis not present

## 2021-09-13 DIAGNOSIS — N186 End stage renal disease: Secondary | ICD-10-CM | POA: Diagnosis not present

## 2021-09-13 DIAGNOSIS — N2581 Secondary hyperparathyroidism of renal origin: Secondary | ICD-10-CM | POA: Diagnosis not present

## 2021-09-13 DIAGNOSIS — Z992 Dependence on renal dialysis: Secondary | ICD-10-CM | POA: Diagnosis not present

## 2021-09-16 DIAGNOSIS — Z992 Dependence on renal dialysis: Secondary | ICD-10-CM | POA: Diagnosis not present

## 2021-09-16 DIAGNOSIS — N2581 Secondary hyperparathyroidism of renal origin: Secondary | ICD-10-CM | POA: Diagnosis not present

## 2021-09-16 DIAGNOSIS — N186 End stage renal disease: Secondary | ICD-10-CM | POA: Diagnosis not present

## 2021-09-18 DIAGNOSIS — Z992 Dependence on renal dialysis: Secondary | ICD-10-CM | POA: Diagnosis not present

## 2021-09-18 DIAGNOSIS — N186 End stage renal disease: Secondary | ICD-10-CM | POA: Diagnosis not present

## 2021-09-18 DIAGNOSIS — N2581 Secondary hyperparathyroidism of renal origin: Secondary | ICD-10-CM | POA: Diagnosis not present

## 2021-09-20 DIAGNOSIS — N2581 Secondary hyperparathyroidism of renal origin: Secondary | ICD-10-CM | POA: Diagnosis not present

## 2021-09-20 DIAGNOSIS — N186 End stage renal disease: Secondary | ICD-10-CM | POA: Diagnosis not present

## 2021-09-20 DIAGNOSIS — Z992 Dependence on renal dialysis: Secondary | ICD-10-CM | POA: Diagnosis not present

## 2021-09-21 DIAGNOSIS — Z992 Dependence on renal dialysis: Secondary | ICD-10-CM | POA: Diagnosis not present

## 2021-09-21 DIAGNOSIS — N186 End stage renal disease: Secondary | ICD-10-CM | POA: Diagnosis not present

## 2021-09-21 DIAGNOSIS — N179 Acute kidney failure, unspecified: Secondary | ICD-10-CM | POA: Diagnosis not present

## 2021-09-23 DIAGNOSIS — N2581 Secondary hyperparathyroidism of renal origin: Secondary | ICD-10-CM | POA: Diagnosis not present

## 2021-09-23 DIAGNOSIS — Z992 Dependence on renal dialysis: Secondary | ICD-10-CM | POA: Diagnosis not present

## 2021-09-23 DIAGNOSIS — N186 End stage renal disease: Secondary | ICD-10-CM | POA: Diagnosis not present

## 2021-09-25 DIAGNOSIS — Z7682 Awaiting organ transplant status: Secondary | ICD-10-CM | POA: Diagnosis not present

## 2021-09-25 DIAGNOSIS — N186 End stage renal disease: Secondary | ICD-10-CM | POA: Diagnosis not present

## 2021-09-25 DIAGNOSIS — N2581 Secondary hyperparathyroidism of renal origin: Secondary | ICD-10-CM | POA: Diagnosis not present

## 2021-09-25 DIAGNOSIS — Z992 Dependence on renal dialysis: Secondary | ICD-10-CM | POA: Diagnosis not present

## 2021-09-27 DIAGNOSIS — Z992 Dependence on renal dialysis: Secondary | ICD-10-CM | POA: Diagnosis not present

## 2021-09-27 DIAGNOSIS — N2581 Secondary hyperparathyroidism of renal origin: Secondary | ICD-10-CM | POA: Diagnosis not present

## 2021-09-27 DIAGNOSIS — N186 End stage renal disease: Secondary | ICD-10-CM | POA: Diagnosis not present

## 2021-09-30 DIAGNOSIS — Z992 Dependence on renal dialysis: Secondary | ICD-10-CM | POA: Diagnosis not present

## 2021-09-30 DIAGNOSIS — N186 End stage renal disease: Secondary | ICD-10-CM | POA: Diagnosis not present

## 2021-09-30 DIAGNOSIS — N2581 Secondary hyperparathyroidism of renal origin: Secondary | ICD-10-CM | POA: Diagnosis not present

## 2021-09-30 NOTE — Progress Notes (Deleted)
Cardiology Office Note:    Date:  09/30/2021   ID:  Jasmine White, DOB June 16, 1946, MRN [id number]  PCP:  Leeroy Cha, MD   [facility] Providers Cardiologist:  Sherren Mocha, MD { Click to update primary MD,subspecialty MD or APP then REFRESH:1}    Referring MD: Leeroy Cha,*   Chief complaint: 3 month f/u of NICM,   History of Present Illness:    Jasmine White is a 76 y.o. female with a hx of acute on chronic ? HF, NICM,  HTN, hypothyroid, ESRD on HD, positive ANCA interstitial lung disease, moderate MR She was discharged from the hospital 01/17/21 with new onset CHF, elevated BNP 3681, moderate MR, and LVEF 30-35% with global hypokinesis. She had   She was last seen in our office by Jasmine White on 03/12/21  Past Medical History:  Diagnosis Date   Arthritis    Bruises easily    Cancer ([place])    basal skin cancer   Chronic back pain    HNP/stenosis and radiculopathy   Hyperlipidemia    takes Pravastatin daily   Hypertension    takes Hyzaar and toprol daily   Hypothyroidism    takes Synthroid daily   Insomnia    takes [place] nightly   Joint pain    Joint swelling    Low BP    past some sedation   Nocturia    PONV (postoperative nausea and vomiting)    with knee replacement b/p dropped   Sinusitis    finished zpak yesterday   Urinary frequency     Past Surgical History:  Procedure Laterality Date   A/V FISTULAGRAM N/A 05/17/2020   Procedure: A/V FISTULAGRAM - Left Arm;  Surgeon: Marty Heck, MD;  Location: [facility];  Service: Cardiovascular;  Laterality: N/A;   ABDOMINAL HYSTERECTOMY     AV FISTULA PLACEMENT Left 04/02/2020   Procedure: ARTERIOVENOUS (AV) FISTULA CREATION;  Surgeon: Rosetta Posner, MD;  Location: [facility];  Service: Vascular;  Laterality: Left;   BACK SURGERY     basal cell skin cancer     on face and forehead   bil knee replacements     BREAST BIOPSY     left breast/benign   COLONOSCOPY     IR  FLUORO GUIDE CV LINE RIGHT  03/20/2020   IR US GUIDE VASC ACCESS RIGHT  03/20/2020   LUMBAR LAMINECTOMY/DECOMPRESSION MICRODISCECTOMY  08/13/2012   Procedure: LUMBAR LAMINECTOMY/DECOMPRESSION MICRODISCECTOMY 1 LEVEL;  Surgeon: Erline Levine, MD;  Location: [facility];  Service: Neurosurgery;  Laterality: Left;  Left lumbar one-two Microdiskectomy   PERIPHERAL VASCULAR BALLOON ANGIOPLASTY Left 05/17/2020   Procedure: PERIPHERAL VASCULAR BALLOON ANGIOPLASTY;  Surgeon: Marty Heck, MD;  Location: [facility];  Service: Cardiovascular;  Laterality: Left;  Arm fistula    RIGHT/LEFT HEART CATH AND CORONARY ANGIOGRAPHY N/A 01/17/2021   Procedure: RIGHT/LEFT HEART CATH AND CORONARY ANGIOGRAPHY;  Surgeon: Lorretta Harp, MD;  Location: [facility];  Service: Cardiovascular;  Laterality: N/A;    Current Medications: No outpatient medications have been marked as taking for the 10/01/21 encounter (Appointment) with Richardson Dopp T, PA-C.     Allergies:   Hctz [hydrochlorothiazide], Other, Percocet [oxycodone-acetaminophen], and Ultram [tramadol]   Social History   Socioeconomic History   Marital status: Married    Spouse name: Jasmine White   Number of children: 2   Years of education: Not on file   Highest education level: Not on file  Occupational History   Occupation: retired  Tobacco Use   Smoking status: Never   Smokeless tobacco: Never  Vaping Use   Vaping Use: Never used  Substance and Sexual Activity   Alcohol use: No    Alcohol/week: 0.0 standard drinks   Drug use: No   Sexual activity: Not Currently  Other Topics Concern   Not on file  Social History Narrative   Not on file   Social Determinants of Health   Financial Resource Strain: Not on file  Food Insecurity: Not on file  Transportation Needs: Not on file  Physical Activity: Not on file  Stress: Not on file  Social Connections: Not on file     Family History: The patient's ***family history includes  Alcohol abuse in her father; Arthritis in her brother and brother; Cancer (age of onset: 63) in her brother; Heart disease in her father and mother; Hypertension in her mother; Stroke in her father. There is no history of Colon cancer, Colon polyps, Stomach cancer, or Rectal cancer.  ROS:   Please see the history of present illness.    *** All other systems reviewed and are negative.  Labs/Other Studies Reviewed:    The following studies were reviewed today:  Echo 06/13/21  Left Ventricle: Left ventricular ejection fraction, by estimation, is 40  to 45%. The left ventricle has mildly decreased function. The left  ventricle demonstrates global hypokinesis. The left ventricular internal  cavity size was normal in size. There is  mild concentric left ventricular hypertrophy. Left ventricular diastolic parameters are indeterminate.  Right Ventricle: The right ventricular size is normal. No increase in  right ventricular wall thickness. Right ventricular systolic function is  normal. Tricuspid regurgitation signal is inadequate for assessing PA  pressure.  Left Atrium: Left atrial size was mildly dilated.  Right Atrium: Right atrial size was normal in size.  Pericardium: There is no evidence of pericardial effusion.  Mitral Valve: The mitral valve is abnormal. There is moderate thickening  of the mitral valve leaflet(s). There is moderate calcification of the  mitral valve leaflet(s). Moderate mitral annular calcification. Moderate  mitral valve regurgitation.  Tricuspid Valve: The tricuspid valve is normal in structure. Tricuspid  valve regurgitation is trivial. No evidence of tricuspid stenosis.  Aortic Valve: The aortic valve is tricuspid. There is moderate  calcification of the aortic valve. There is mild thickening of the aortic  valve. There is mild aortic valve annular calcification. Aortic valve  regurgitation is moderate. Aortic regurgitation PHT measures 517 msec. Mild to moderate  aortic valve sclerosis/calcification is present, without any evidence of aortic stenosis.  Pulmonic Valve: The pulmonic valve was not well visualized. Pulmonic valve  regurgitation is trivial. No evidence of pulmonic stenosis.  Aorta: The aortic root, ascending aorta and aortic arch are all  structurally normal, with no evidence of dilitation or obstruction. There  is borderline dilatation of the ascending aorta, measuring 38 mm.  Venous: The inferior vena cava is normal in size with greater than 50%  respiratory variability, suggesting right atrial pressure of 3 mmHg.  IAS/Shunts: The atrial septum is grossly normal.    R/LHC 01/17/21  IMPRESSION: Ms. Bortner has essentially normal coronary arteries and normal filling pressures.  She appears to be euvolemic.  She did not appear to have a large V wave although by echo she has severe mitral regurgitation.  Guideline directed optimal medical therapy will be recommended.  She may be a candidate for MitraClip which Dr. Burt Knack will address.  The right common femoral vein had Mynx closure successfully.  The arterial sheath will be pulled manually given the fact that there is very little subcutaneous tissue.  The patient left lab in stable condition.    Recent Labs: 12/12/2020: Pro B Natriuretic peptide (BNP) 3,681.0 01/15/2021: ALT 25; Platelets 163; TSH 1.295 01/17/2021: BUN 12; Creatinine, Ser 2.55; Hemoglobin 10.9; Potassium 3.6; Sodium 138  Recent Lipid Panel    Component Value Date/Time   CHOL 130 01/17/2021 1020   CHOL 173 03/01/2020 0927   CHOL 169 12/21/2012 1552   TRIG 84 01/17/2021 1020   TRIG 97 12/26/2014 1150   TRIG 64 12/21/2012 1552   HDL 60 01/17/2021 1020   HDL 67 03/01/2020 0927   HDL 75 12/26/2014 1150   HDL 63 12/21/2012 1552   CHOLHDL 2.2 01/17/2021 1020   VLDL 17 01/17/2021 1020   LDLCALC 53 01/17/2021 1020   LDLCALC 94 03/01/2020 0927   LDLCALC 102 (H) 07/06/2014 0912   LDLCALC 93 12/21/2012 1552     Risk  Assessment/Calculations:   {Does this patient have ATRIAL FIBRILLATION?:[id number]}       Physical Exam:    VS:  There were no vitals taken for this visit.    Wt Readings from Last 3 Encounters:  08/05/21 114 lb 3.2 oz (51.8 kg)  03/12/21 113 lb 9.6 oz (51.5 kg)  01/17/21 110 lb 0.2 oz (49.9 kg)     GEN: *** Well nourished, well developed in no acute distress HEENT: Normal NECK: No JVD; No carotid bruits LYMPHATICS: No lymphadenopathy CARDIAC: ***RRR, no murmurs, rubs, gallops RESPIRATORY:  Clear to auscultation without rales, wheezing or rhonchi  ABDOMEN: Soft, non-tender, non-distended MUSCULOSKELETAL:  No edema; No deformity  SKIN: Warm and dry NEUROLOGIC:  Alert and oriented x 3 PSYCHIATRIC:  Normal affect   EKG:  EKG is *** ordered today.  The ekg ordered today demonstrates ***  Diagnoses:    No diagnosis found. Assessment and Plan:     ***      {Are you ordering a CV Procedure (e.g. stress test, cath, DCCV, TEE, etc)?   Press F2        :[id number]}    Medication Adjustments/Labs and Tests Ordered: Current medicines are reviewed at length with the patient today.  Concerns regarding medicines are outlined above.  No orders of the defined types were placed in this encounter.  No orders of the defined types were placed in this encounter.   There are no Patient Instructions on file for this visit.   Signed, Emmaline Life, NP  09/30/2021 5:16 PM    [facility]

## 2021-10-01 ENCOUNTER — Ambulatory Visit: Payer: Medicare PPO | Admitting: Nurse Practitioner

## 2021-10-01 ENCOUNTER — Other Ambulatory Visit: Payer: Self-pay

## 2021-10-01 ENCOUNTER — Encounter: Payer: Self-pay | Admitting: Nurse Practitioner

## 2021-10-01 VITALS — BP 110/60 | HR 72 | Ht 66.0 in | Wt 122.2 lb

## 2021-10-01 DIAGNOSIS — I10 Essential (primary) hypertension: Secondary | ICD-10-CM | POA: Diagnosis not present

## 2021-10-01 DIAGNOSIS — N186 End stage renal disease: Secondary | ICD-10-CM

## 2021-10-01 DIAGNOSIS — I428 Other cardiomyopathies: Secondary | ICD-10-CM | POA: Diagnosis not present

## 2021-10-01 DIAGNOSIS — I340 Nonrheumatic mitral (valve) insufficiency: Secondary | ICD-10-CM | POA: Diagnosis not present

## 2021-10-01 DIAGNOSIS — I5022 Chronic systolic (congestive) heart failure: Secondary | ICD-10-CM | POA: Diagnosis not present

## 2021-10-01 MED ORDER — CARVEDILOL 3.125 MG PO TABS: 3.1250 mg | ORAL_TABLET | Freq: Two times a day (BID) | ORAL | 3 refills | Status: DC

## 2021-10-01 NOTE — Progress Notes (Signed)
Cardiology Office Note:    Date:  10/01/2021   ID:  SHAHEEN STAR, DOB 1946/08/05, MRN [id number]  PCP:  Leeroy Cha, MD   [facility] Providers Cardiologist:  Sherren Mocha, MD     Referring MD: Leeroy Cha,*   Chief complaint: 6 mo follow-up NICM, hypertension  History of Present Illness:    Jasmine White is a 76 y.o. female with a hx of chronic HFrEF, NICM,  HTN, hypothyroid, positive ANCA rapid progressive glomerulonephritis,  ESRD on HD, interstitial lung disease, and moderate MR.    She was previously healthy, walking 5-6 miles per day and working out at the [facility] until she was diagnosed with Covid-19 infection in the fall of 2020.  She continued to have symptoms of "not feeling right." In June 2021, she was found to have significantly higher creatinine at PCP and subsequently presented  to the ED on 03/17/20 after an episode of vomiting and was found to have creatinine > 7, BUN 119, Na+ 109. 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 She was discharged on 04/10/20 but unfortunately returned to the ED on 04/16/20 with chest pain. She was readmitted and treated for acute respiratory failure with hypoxia thought to be multifactorial w/pulmonary edema and pulmonary vasculitis, multifocal pneumonia. She was discharged on 05/03/20 after a total of 40 days combined hospitalization. Echo 7/21 revealed LVEF 55-60%, G1DD, moderate MR, moderate to severe AI.   On 01/15/21, she presented to the ED with SOB and chest pain. Her BNP was elevated at 3681, and echo on 01/16/21 revealed moderate  to severe  MR, and LVEF 30-35% with global hypokinesis, moderate AI.She underwent R/LHC on 01/17/21 which revealed essentially normal coronary arteries and normal filling pressures. She did not appear to have a large V wave although by echo she  has severe MR. She was discharged later that day.   She was last seen in our office by Ermalinda Barrios on 03/12/21 at which time 4 month follow-up with echo prior was scheduled. Echo on 06/13/21 revealed improved to 40-45%, moderate mitral valve stenosis.   She sent a message on 06/13/21 that dialysis was being slowed by her low BP readings  on some occasions. Dr. Burt Knack advised her to hold Coreg on the mornings of dialysis and if this did not improve BP to the point that dialysis was not being delayed, she could d/c Coreg altogether.   Today, she is here with her husband for follow-up. She is working on testing for kidney transplant and reports she has been approved for transplant at [facility] in [place]. Her daughter is testing to be her donor. She denies chest pain, shortness of breath, lower extremity edema, palpitations, melena, hematuria, hemoptysis, diaphoresis, presyncope, syncope, orthopnea, and PND. She reports weakness and fatigue on the days she has dialysis but is otherwise feeling well. She is holding her Coreg on dialysis days. 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 She brings her bp and pulse readings from home and these are stable. Her pulse is a little higher (high 80s and low 90s) on dialysis days which is understandable due to holding Coreg. Has been limited to 40 oz of fluids by nephrology.   Past Medical History:  Diagnosis Date   Arthritis    Bruises easily    Cancer ([place])    basal skin cancer   Chronic back pain  HNP/stenosis and radiculopathy   Hyperlipidemia    takes Pravastatin daily   Hypertension    takes Hyzaar and toprol daily   Hypothyroidism    takes Synthroid daily   Insomnia    takes Elevil nightly   Joint pain    Joint swelling    Low BP    past some sedation   Nocturia    PONV (postoperative nausea and vomiting)    with knee replacement b/p dropped   Sinusitis    finished zpak yesterday   Urinary frequency     Past  Surgical History:  Procedure Laterality Date   A/V FISTULAGRAM N/A 05/17/2020   Procedure: A/V FISTULAGRAM - Left Arm;  Surgeon: Marty Heck, MD;  Location: [facility];  Service: Cardiovascular;  Laterality: N/A;   ABDOMINAL HYSTERECTOMY     AV FISTULA PLACEMENT Left 04/02/2020   Procedure: ARTERIOVENOUS (AV) FISTULA CREATION;  Surgeon: Rosetta Posner, MD;  Location: [facility];  Service: Vascular;  Laterality: Left;   BACK SURGERY     basal cell skin cancer     on face and forehead   bil knee replacements     BREAST BIOPSY     left breast/benign   COLONOSCOPY     IR FLUORO GUIDE CV LINE RIGHT  03/20/2020   IR US GUIDE VASC ACCESS RIGHT  03/20/2020   LUMBAR LAMINECTOMY/DECOMPRESSION MICRODISCECTOMY  08/13/2012   Procedure: LUMBAR LAMINECTOMY/DECOMPRESSION MICRODISCECTOMY 1 LEVEL;  Surgeon: Erline Levine, MD;  Location: [facility];  Service: Neurosurgery;  Laterality: Left;  Left lumbar one-two Microdiskectomy   PERIPHERAL VASCULAR BALLOON ANGIOPLASTY Left 05/17/2020   Procedure: PERIPHERAL VASCULAR BALLOON ANGIOPLASTY;  Surgeon: Marty Heck, MD;  Location: [facility];  Service: Cardiovascular;  Laterality: Left;  Arm fistula    RIGHT/LEFT HEART CATH AND CORONARY ANGIOGRAPHY N/A 01/17/2021   Procedure: RIGHT/LEFT HEART CATH AND CORONARY ANGIOGRAPHY;  Surgeon: Lorretta Harp, MD;  Location: [facility];  Service: Cardiovascular;  Laterality: N/A;    Current Medications: Current Meds  Medication Sig   acetaminophen (TYLENOL) 500 MG tablet Take 500-1,000 mg by mouth daily as needed (for pain).   albuterol (VENTOLIN HFA) 108 (90 Base) MCG/ACT inhaler Inhale 2 puffs into the lungs every 6 (six) hours as needed for wheezing or shortness of breath.   b complex-vitamin c-folic acid (NEPHRO-VITE) 0.8 MG TABS tablet Take 1 tablet by mouth at bedtime.   escitalopram (LEXAPRO) 20 MG tablet Take 20 mg by mouth daily.   HYDROcodone-acetaminophen (NORCO/VICODIN) 5-325  MG tablet Take 0.5 tablets by mouth at bedtime as needed (for pain).   loratadine (CLARITIN) 10 MG tablet Take 10 mg by mouth daily.   sevelamer carbonate (RENVELA) 800 MG tablet Take 800 mg by mouth 3 (three) times daily with meals. Patient take 4-800 mg by mouth at each meal   SYNTHROID 88 MCG tablet TAKE 1 TABLET BY MOUTH ONCE DAILY BEFORE BREAKFAST   zolpidem (AMBIEN) 5 MG tablet Take 1 tablet by mouth at bedtime.   [DISCONTINUED] carvedilol (COREG) 3.125 MG tablet Patient take 3.125 mg by mouth two times every other day     Allergies:   Hydrochlorothiazide, Other, Percocet [oxycodone-acetaminophen], and Tramadol   Social History   Socioeconomic History   Marital status: Married    Spouse name: Ray   Number of children: 2   Years of education: Not on file   Highest education level: Not on file  Occupational History   Occupation: retired  Tobacco Use   Smoking status: Never   Smokeless tobacco: Never  Vaping Use   Vaping Use: Never used  Substance and Sexual Activity   Alcohol use: No    Alcohol/week: 0.0 standard drinks   Drug use: No   Sexual activity: Not Currently  Other Topics Concern   Not on file  Social History Narrative   Not on file   Social Determinants of Health   Financial Resource Strain: Not on file  Food Insecurity: Not on file  Transportation Needs: Not on file  Physical Activity: Not on file  Stress: Not on file  Social Connections: Not on file     Family History: The patient's family history includes Alcohol abuse in her father; Arthritis in her brother and brother; Cancer (age of onset: 49) in her brother; Heart disease in her father and mother; Hypertension in her mother; Stroke in her father. There is no history of Colon cancer, Colon polyps, Stomach cancer, or Rectal cancer.  ROS:   Please see the history of present illness. All other systems reviewed and are negative.  Labs/Other Studies Reviewed:    The following studies were reviewed  today:  Echo 06/13/21  Left Ventricle: Left ventricular ejection fraction, by estimation, is 40  to 45%. The left ventricle has mildly decreased function. The left  ventricle demonstrates global hypokinesis. The left ventricular internal  cavity size was normal in size. There is  mild concentric left ventricular hypertrophy. Left ventricular diastolic parameters are indeterminate.  Right Ventricle: The right ventricular size is normal. No increase in  right ventricular wall thickness. Right ventricular systolic function is  normal. Tricuspid regurgitation signal is inadequate for assessing PA  pressure.  Left Atrium: Left atrial size was mildly dilated.  Right Atrium: Right atrial size was normal in size.  Pericardium: There is no evidence of pericardial effusion.  Mitral Valve: The mitral valve is abnormal. There is moderate thickening  of the mitral valve leaflet(s). There is moderate calcification of the  mitral valve leaflet(s). Moderate mitral annular calcification. Moderate  mitral valve regurgitation.  Tricuspid Valve: The tricuspid valve is normal in structure. Tricuspid  valve regurgitation is trivial. No evidence of tricuspid stenosis.  Aortic Valve: The aortic valve is tricuspid. There is moderate  calcification of the aortic valve. There is mild thickening of the aortic  valve. There is mild aortic valve annular calcification. Aortic valve  regurgitation is moderate. Aortic regurgitation PHT measures 517 msec. Mild to moderate aortic valve sclerosis/calcification is present, without any evidence of aortic stenosis.  Pulmonic Valve: The pulmonic valve was not well visualized. Pulmonic valve  regurgitation is trivial. No evidence of pulmonic stenosis.  Aorta: The aortic root, ascending aorta and aortic arch are all  structurally normal, with no evidence of dilitation or obstruction. There  is borderline dilatation of the ascending aorta, measuring 38 mm.  Venous: The inferior  vena cava is normal in size with greater than 50%  respiratory variability, suggesting right atrial pressure of 3 mmHg.  IAS/Shunts: The atrial septum is grossly normal.    R/LHC 01/17/21  IMPRESSION: Ms. Kady has essentially normal coronary arteries and normal filling pressures.  She appears to be euvolemic.  She did not appear to have a large V wave although by echo she has severe mitral regurgitation.  Guideline directed optimal medical therapy will be recommended.  She may be a candidate for MitraClip which Dr. Burt Knack will address.  The right common femoral vein had Mynx closure successfully.  The arterial sheath will be pulled manually given the fact that there is very little subcutaneous tissue.  The patient left lab in stable condition.    Recent Labs: 12/12/2020: Pro B Natriuretic peptide (BNP) 3,681.0 01/15/2021: ALT 25; Platelets 163; TSH 1.295 01/17/2021: BUN 12; Creatinine, Ser 2.55; Hemoglobin 10.9; Potassium 3.6; Sodium 138  Recent Lipid Panel    Component Value Date/Time   CHOL 130 01/17/2021 1020   CHOL 173 03/01/2020 0927   CHOL 169 12/21/2012 1552   TRIG 84 01/17/2021 1020   TRIG 97 12/26/2014 1150   TRIG 64 12/21/2012 1552   HDL 60 01/17/2021 1020   HDL 67 03/01/2020 0927   HDL 75 12/26/2014 1150   HDL 63 12/21/2012 1552   CHOLHDL 2.2 01/17/2021 1020   VLDL 17 01/17/2021 1020   LDLCALC 53 01/17/2021 1020   LDLCALC 94 03/01/2020 0927   LDLCALC 102 (H) 07/06/2014 0912   LDLCALC 93 12/21/2012 1552     Physical Exam:    VS:  BP 110/60    Pulse 72    Ht 5\' 6"  (1.676 m)    Wt 122 lb 3.2 oz (55.4 kg)    SpO2 94%    BMI 19.72 kg/m     Wt Readings from Last 3 Encounters:  10/01/21 122 lb 3.2 oz (55.4 kg)  08/05/21 114 lb 3.2 oz (51.8 kg)  03/12/21 113 lb 9.6 oz (51.5 kg)     GEN:  Well nourished, well developed in no acute distress HEENT: Normal NECK: No JVD; No carotid bruits LYMPHATICS: No lymphadenopathy CARDIAC: RRR, no murmurs, rubs,  gallops RESPIRATORY:  Clear to auscultation without rales, wheezing or rhonchi  ABDOMEN: Soft, non-tender, non-distended MUSCULOSKELETAL:  No edema; No deformity  SKIN: Warm and dry NEUROLOGIC:  Alert and oriented x 3 PSYCHIATRIC:  Normal affect   EKG:  EKG is not ordered today.    Diagnoses:    1. Chronic HFrEF (heart failure with reduced ejection fraction) ([place])   2. Essential hypertension   3. NICM (nonischemic cardiomyopathy) ([place])   4. ESRD (end stage renal disease) ([place])   5. Moderate mitral regurgitation    Assessment and Plan:     Chronic HFrEF/NICM: Echo 9/22 shows improved LVEF 40-45%, previously 30 to 35% in April 2022. Appears euvolemic on exam today. Denies dyspnea, edema, PND, orthopnea. Reports that she is frequently told that she is volume up at dialysis, but she limits her fluid intake to 42 oz as directed by nephrology. Avoids high sodium foods. Continue carvedilol.  Moderate mitral regurgitation: I do not appreciate a murmur on exam today. Most recent echo 9/22 reveals moderate MR, previously reported as severe. She is undergoing workup for kidney transplant. We will plan to have her follow-up with Dr. Burt Knack in 4-6 months based on the timing of her transplant. Would favor TEE in the future for better evaluation of the mitral valve and input from Dr. Burt Knack for consideration of additional testing/consideration of intervention in the future.  Essential hypertension: BP is soft today. She monitors BP daily at home and brings these readings in for review today; these readings are stable. Continue carvedilol with permission to hold it on dialysis days.     Disposition: 4-6 months with Dr. Burt Knack  Medication Adjustments/Labs and Tests Ordered: Current medicines are reviewed at length with the patient today.  Concerns regarding medicines are outlined above.  No orders of the defined types were placed in this encounter.  Meds ordered this encounter  Medications  carvedilol (COREG) 3.125 MG tablet    Sig: Take 1 tablet (3.125 mg total) by mouth 2 (two) times daily with a meal. Patient take 3.125 mg by mouth two times every other day    Dispense:  180 tablet    Refill:  3    Order Specific Question:   Supervising Provider    Answer:   Thayer Headings [phone]    Patient Instructions  Medication Instructions:  Your physician recommends that you continue on your current medications as directed. Please refer to the Current Medication list given to you today.  *If you need a refill on your cardiac medications before your next appointment, please call your pharmacy*   Lab Work: None Ordered If you have labs (blood work) drawn today and your tests are completely normal, you will receive your results only by: [place] (if you have MyChart) OR A paper copy in the mail If you have any lab test that is abnormal or we need to change your treatment, we will call you to review the results.   Testing/Procedures: None Ordered   Follow-Up: At [facility], you and your health needs are our priority.  As part of our continuing mission to provide you with exceptional heart care, we have created designated Provider Care Teams.  These Care Teams include your primary Cardiologist (physician) and Advanced Practice Providers (APPs -  Physician Assistants and Nurse Practitioners) who all work together to provide you with the care you need, when you need it.  We recommend signing up for the patient portal called "MyChart".  Sign up information is provided on this After Visit Summary.  MyChart is used to connect with patients for Virtual Visits (Telemedicine).  Patients are able to view lab/test results, encounter notes, upcoming appointments, etc.  Non-urgent messages can be sent to your provider as well.   To learn more about what you can do with MyChart, go to [email or web address].    Your next appointment:   4 month(s)  The format for your next  appointment:   In Person  Provider:   Sherren Mocha, MD        Signed, Ann Maki Lanice Schwab, NP  10/01/2021 4:49 PM    [place]

## 2021-10-01 NOTE — Patient Instructions (Signed)
Medication Instructions:  Your physician recommends that you continue on your current medications as directed. Please refer to the Current Medication list given to you today.  *If you need a refill on your cardiac medications before your next appointment, please call your pharmacy*   Lab Work: None Ordered If you have labs (blood work) drawn today and your tests are completely normal, you will receive your results only by: [place] (if you have MyChart) OR A paper copy in the mail If you have any lab test that is abnormal or we need to change your treatment, we will call you to review the results.   Testing/Procedures: None Ordered   Follow-Up: At [facility], you and your health needs are our priority.  As part of our continuing mission to provide you with exceptional heart care, we have created designated Provider Care Teams.  These Care Teams include your primary Cardiologist (physician) and Advanced Practice Providers (APPs -  Physician Assistants and Nurse Practitioners) who all work together to provide you with the care you need, when you need it.  We recommend signing up for the patient portal called "MyChart".  Sign up information is provided on this After Visit Summary.  MyChart is used to connect with patients for Virtual Visits (Telemedicine).  Patients are able to view lab/test results, encounter notes, upcoming appointments, etc.  Non-urgent messages can be sent to your provider as well.   To learn more about what you can do with MyChart, go to [email or web address].    Your next appointment:   4 month(s)  The format for your next appointment:   In Person  Provider:   Sherren Mocha, MD

## 2021-10-02 DIAGNOSIS — N186 End stage renal disease: Secondary | ICD-10-CM | POA: Diagnosis not present

## 2021-10-02 DIAGNOSIS — N2581 Secondary hyperparathyroidism of renal origin: Secondary | ICD-10-CM | POA: Diagnosis not present

## 2021-10-02 DIAGNOSIS — Z992 Dependence on renal dialysis: Secondary | ICD-10-CM | POA: Diagnosis not present

## 2021-10-04 DIAGNOSIS — N186 End stage renal disease: Secondary | ICD-10-CM | POA: Diagnosis not present

## 2021-10-04 DIAGNOSIS — Z992 Dependence on renal dialysis: Secondary | ICD-10-CM | POA: Diagnosis not present

## 2021-10-04 DIAGNOSIS — N2581 Secondary hyperparathyroidism of renal origin: Secondary | ICD-10-CM | POA: Diagnosis not present

## 2021-10-04 MED ORDER — CARVEDILOL 3.125 MG PO TABS: 3.1250 mg | ORAL_TABLET | Freq: Two times a day (BID) | ORAL | 3 refills | Status: DC

## 2021-10-04 NOTE — Addendum Note (Signed)
Addended by: Gaetano Net on: 10/04/2021 08:19 AM   Modules accepted: Orders

## 2021-10-07 DIAGNOSIS — N2581 Secondary hyperparathyroidism of renal origin: Secondary | ICD-10-CM | POA: Diagnosis not present

## 2021-10-07 DIAGNOSIS — Z992 Dependence on renal dialysis: Secondary | ICD-10-CM | POA: Diagnosis not present

## 2021-10-07 DIAGNOSIS — N186 End stage renal disease: Secondary | ICD-10-CM | POA: Diagnosis not present

## 2021-10-08 ENCOUNTER — Ambulatory Visit
Admission: RE | Admit: 2021-10-08 | Discharge: 2021-10-08 | Disposition: A | Discharge status: Home / Self Care | Payer: Medicare PPO | Source: Ambulatory Visit | Attending: Internal Medicine | Admitting: Internal Medicine

## 2021-10-08 DIAGNOSIS — Z1231 Encounter for screening mammogram for malignant neoplasm of breast: Secondary | ICD-10-CM

## 2021-10-09 DIAGNOSIS — Z992 Dependence on renal dialysis: Secondary | ICD-10-CM | POA: Diagnosis not present

## 2021-10-09 DIAGNOSIS — N2581 Secondary hyperparathyroidism of renal origin: Secondary | ICD-10-CM | POA: Diagnosis not present

## 2021-10-09 DIAGNOSIS — N186 End stage renal disease: Secondary | ICD-10-CM | POA: Diagnosis not present

## 2021-10-10 DIAGNOSIS — N186 End stage renal disease: Secondary | ICD-10-CM | POA: Diagnosis not present

## 2021-10-10 DIAGNOSIS — F3341 Major depressive disorder, recurrent, in partial remission: Secondary | ICD-10-CM | POA: Diagnosis not present

## 2021-10-10 DIAGNOSIS — Z23 Encounter for immunization: Secondary | ICD-10-CM | POA: Diagnosis not present

## 2021-10-10 DIAGNOSIS — G8929 Other chronic pain: Secondary | ICD-10-CM | POA: Diagnosis not present

## 2021-10-10 DIAGNOSIS — I10 Essential (primary) hypertension: Secondary | ICD-10-CM | POA: Diagnosis not present

## 2021-10-10 DIAGNOSIS — R2681 Unsteadiness on feet: Secondary | ICD-10-CM | POA: Diagnosis not present

## 2021-10-10 DIAGNOSIS — M545 Low back pain, unspecified: Secondary | ICD-10-CM | POA: Diagnosis not present

## 2021-10-10 DIAGNOSIS — E039 Hypothyroidism, unspecified: Secondary | ICD-10-CM | POA: Diagnosis not present

## 2021-10-11 DIAGNOSIS — Z992 Dependence on renal dialysis: Secondary | ICD-10-CM | POA: Diagnosis not present

## 2021-10-11 DIAGNOSIS — N186 End stage renal disease: Secondary | ICD-10-CM | POA: Diagnosis not present

## 2021-10-11 DIAGNOSIS — N2581 Secondary hyperparathyroidism of renal origin: Secondary | ICD-10-CM | POA: Diagnosis not present

## 2021-10-14 DIAGNOSIS — N186 End stage renal disease: Secondary | ICD-10-CM | POA: Diagnosis not present

## 2021-10-14 DIAGNOSIS — N2581 Secondary hyperparathyroidism of renal origin: Secondary | ICD-10-CM | POA: Diagnosis not present

## 2021-10-14 DIAGNOSIS — Z992 Dependence on renal dialysis: Secondary | ICD-10-CM | POA: Diagnosis not present

## 2021-10-15 DIAGNOSIS — F633 Trichotillomania: Secondary | ICD-10-CM | POA: Diagnosis not present

## 2021-10-15 DIAGNOSIS — Z85828 Personal history of other malignant neoplasm of skin: Secondary | ICD-10-CM | POA: Diagnosis not present

## 2021-10-15 DIAGNOSIS — L570 Actinic keratosis: Secondary | ICD-10-CM | POA: Diagnosis not present

## 2021-10-15 DIAGNOSIS — C44619 Basal cell carcinoma of skin of left upper limb, including shoulder: Secondary | ICD-10-CM | POA: Diagnosis not present

## 2021-10-15 DIAGNOSIS — D225 Melanocytic nevi of trunk: Secondary | ICD-10-CM | POA: Diagnosis not present

## 2021-10-15 DIAGNOSIS — D485 Neoplasm of uncertain behavior of skin: Secondary | ICD-10-CM | POA: Diagnosis not present

## 2021-10-15 DIAGNOSIS — D692 Other nonthrombocytopenic purpura: Secondary | ICD-10-CM | POA: Diagnosis not present

## 2021-10-15 DIAGNOSIS — L720 Epidermal cyst: Secondary | ICD-10-CM | POA: Diagnosis not present

## 2021-10-15 DIAGNOSIS — L280 Lichen simplex chronicus: Secondary | ICD-10-CM | POA: Diagnosis not present

## 2021-10-15 DIAGNOSIS — L565 Disseminated superficial actinic porokeratosis (DSAP): Secondary | ICD-10-CM | POA: Diagnosis not present

## 2021-10-16 DIAGNOSIS — N2581 Secondary hyperparathyroidism of renal origin: Secondary | ICD-10-CM | POA: Diagnosis not present

## 2021-10-16 DIAGNOSIS — N186 End stage renal disease: Secondary | ICD-10-CM | POA: Diagnosis not present

## 2021-10-16 DIAGNOSIS — Z992 Dependence on renal dialysis: Secondary | ICD-10-CM | POA: Diagnosis not present

## 2021-10-18 DIAGNOSIS — Z992 Dependence on renal dialysis: Secondary | ICD-10-CM | POA: Diagnosis not present

## 2021-10-18 DIAGNOSIS — N2581 Secondary hyperparathyroidism of renal origin: Secondary | ICD-10-CM | POA: Diagnosis not present

## 2021-10-18 DIAGNOSIS — N186 End stage renal disease: Secondary | ICD-10-CM | POA: Diagnosis not present

## 2021-10-21 DIAGNOSIS — N2581 Secondary hyperparathyroidism of renal origin: Secondary | ICD-10-CM | POA: Diagnosis not present

## 2021-10-21 DIAGNOSIS — N186 End stage renal disease: Secondary | ICD-10-CM | POA: Diagnosis not present

## 2021-10-21 DIAGNOSIS — Z992 Dependence on renal dialysis: Secondary | ICD-10-CM | POA: Diagnosis not present

## 2021-10-22 DIAGNOSIS — Z992 Dependence on renal dialysis: Secondary | ICD-10-CM | POA: Diagnosis not present

## 2021-10-22 DIAGNOSIS — N179 Acute kidney failure, unspecified: Secondary | ICD-10-CM | POA: Diagnosis not present

## 2021-10-22 DIAGNOSIS — N186 End stage renal disease: Secondary | ICD-10-CM | POA: Diagnosis not present

## 2021-10-23 DIAGNOSIS — N2581 Secondary hyperparathyroidism of renal origin: Secondary | ICD-10-CM | POA: Diagnosis not present

## 2021-10-23 DIAGNOSIS — N186 End stage renal disease: Secondary | ICD-10-CM | POA: Diagnosis not present

## 2021-10-23 DIAGNOSIS — Z992 Dependence on renal dialysis: Secondary | ICD-10-CM | POA: Diagnosis not present

## 2021-10-25 DIAGNOSIS — N186 End stage renal disease: Secondary | ICD-10-CM | POA: Diagnosis not present

## 2021-10-25 DIAGNOSIS — N2581 Secondary hyperparathyroidism of renal origin: Secondary | ICD-10-CM | POA: Diagnosis not present

## 2021-10-25 DIAGNOSIS — Z992 Dependence on renal dialysis: Secondary | ICD-10-CM | POA: Diagnosis not present

## 2021-10-28 DIAGNOSIS — Z992 Dependence on renal dialysis: Secondary | ICD-10-CM | POA: Diagnosis not present

## 2021-10-28 DIAGNOSIS — N186 End stage renal disease: Secondary | ICD-10-CM | POA: Diagnosis not present

## 2021-10-28 DIAGNOSIS — N2581 Secondary hyperparathyroidism of renal origin: Secondary | ICD-10-CM | POA: Diagnosis not present

## 2021-10-30 DIAGNOSIS — N186 End stage renal disease: Secondary | ICD-10-CM | POA: Diagnosis not present

## 2021-10-30 DIAGNOSIS — N2581 Secondary hyperparathyroidism of renal origin: Secondary | ICD-10-CM | POA: Diagnosis not present

## 2021-10-30 DIAGNOSIS — Z992 Dependence on renal dialysis: Secondary | ICD-10-CM | POA: Diagnosis not present

## 2021-10-31 DIAGNOSIS — Z949 Transplanted organ and tissue status, unspecified: Secondary | ICD-10-CM | POA: Diagnosis not present

## 2021-10-31 DIAGNOSIS — Z01818 Encounter for other preprocedural examination: Secondary | ICD-10-CM | POA: Diagnosis not present

## 2021-11-01 DIAGNOSIS — Z992 Dependence on renal dialysis: Secondary | ICD-10-CM | POA: Diagnosis not present

## 2021-11-01 DIAGNOSIS — N186 End stage renal disease: Secondary | ICD-10-CM | POA: Diagnosis not present

## 2021-11-01 DIAGNOSIS — N2581 Secondary hyperparathyroidism of renal origin: Secondary | ICD-10-CM | POA: Diagnosis not present

## 2021-11-04 DIAGNOSIS — N2581 Secondary hyperparathyroidism of renal origin: Secondary | ICD-10-CM | POA: Diagnosis not present

## 2021-11-04 DIAGNOSIS — Z992 Dependence on renal dialysis: Secondary | ICD-10-CM | POA: Diagnosis not present

## 2021-11-04 DIAGNOSIS — N186 End stage renal disease: Secondary | ICD-10-CM | POA: Diagnosis not present

## 2021-11-05 DIAGNOSIS — Z79899 Other long term (current) drug therapy: Secondary | ICD-10-CM | POA: Diagnosis not present

## 2021-11-05 DIAGNOSIS — Z992 Dependence on renal dialysis: Secondary | ICD-10-CM | POA: Diagnosis not present

## 2021-11-05 DIAGNOSIS — I080 Rheumatic disorders of both mitral and aortic valves: Secondary | ICD-10-CM | POA: Diagnosis not present

## 2021-11-05 DIAGNOSIS — N186 End stage renal disease: Secondary | ICD-10-CM | POA: Diagnosis not present

## 2021-11-05 DIAGNOSIS — Z01810 Encounter for preprocedural cardiovascular examination: Secondary | ICD-10-CM | POA: Diagnosis not present

## 2021-11-06 DIAGNOSIS — N186 End stage renal disease: Secondary | ICD-10-CM | POA: Diagnosis not present

## 2021-11-06 DIAGNOSIS — Z992 Dependence on renal dialysis: Secondary | ICD-10-CM | POA: Diagnosis not present

## 2021-11-06 DIAGNOSIS — N2581 Secondary hyperparathyroidism of renal origin: Secondary | ICD-10-CM | POA: Diagnosis not present

## 2021-11-07 DIAGNOSIS — D0462 Carcinoma in situ of skin of left upper limb, including shoulder: Secondary | ICD-10-CM | POA: Diagnosis not present

## 2021-11-07 DIAGNOSIS — C44619 Basal cell carcinoma of skin of left upper limb, including shoulder: Secondary | ICD-10-CM | POA: Diagnosis not present

## 2021-11-07 DIAGNOSIS — Z85828 Personal history of other malignant neoplasm of skin: Secondary | ICD-10-CM | POA: Diagnosis not present

## 2021-11-07 DIAGNOSIS — D485 Neoplasm of uncertain behavior of skin: Secondary | ICD-10-CM | POA: Diagnosis not present

## 2021-11-08 DIAGNOSIS — Z01818 Encounter for other preprocedural examination: Secondary | ICD-10-CM | POA: Diagnosis not present

## 2021-11-08 DIAGNOSIS — Z940 Kidney transplant status: Secondary | ICD-10-CM | POA: Diagnosis not present

## 2021-11-08 DIAGNOSIS — R918 Other nonspecific abnormal finding of lung field: Secondary | ICD-10-CM | POA: Diagnosis not present

## 2021-11-08 DIAGNOSIS — I517 Cardiomegaly: Secondary | ICD-10-CM | POA: Diagnosis not present

## 2021-11-09 DIAGNOSIS — N2581 Secondary hyperparathyroidism of renal origin: Secondary | ICD-10-CM | POA: Diagnosis not present

## 2021-11-09 DIAGNOSIS — N186 End stage renal disease: Secondary | ICD-10-CM | POA: Diagnosis not present

## 2021-11-09 DIAGNOSIS — Z992 Dependence on renal dialysis: Secondary | ICD-10-CM | POA: Diagnosis not present

## 2021-11-09 DIAGNOSIS — Z03818 Encounter for observation for suspected exposure to other biological agents ruled out: Secondary | ICD-10-CM | POA: Diagnosis not present

## 2021-11-11 DIAGNOSIS — N186 End stage renal disease: Secondary | ICD-10-CM | POA: Diagnosis not present

## 2021-11-11 DIAGNOSIS — N2581 Secondary hyperparathyroidism of renal origin: Secondary | ICD-10-CM | POA: Diagnosis not present

## 2021-11-11 DIAGNOSIS — Z992 Dependence on renal dialysis: Secondary | ICD-10-CM | POA: Diagnosis not present

## 2021-11-13 DIAGNOSIS — N2581 Secondary hyperparathyroidism of renal origin: Secondary | ICD-10-CM | POA: Diagnosis not present

## 2021-11-13 DIAGNOSIS — Z992 Dependence on renal dialysis: Secondary | ICD-10-CM | POA: Diagnosis not present

## 2021-11-13 DIAGNOSIS — N186 End stage renal disease: Secondary | ICD-10-CM | POA: Diagnosis not present

## 2021-11-14 DIAGNOSIS — Z992 Dependence on renal dialysis: Secondary | ICD-10-CM | POA: Diagnosis not present

## 2021-11-14 DIAGNOSIS — T82858A Stenosis of vascular prosthetic devices, implants and grafts, initial encounter: Secondary | ICD-10-CM | POA: Diagnosis not present

## 2021-11-14 DIAGNOSIS — N186 End stage renal disease: Secondary | ICD-10-CM | POA: Diagnosis not present

## 2021-11-14 DIAGNOSIS — I871 Compression of vein: Secondary | ICD-10-CM | POA: Diagnosis not present

## 2021-11-15 DIAGNOSIS — Z992 Dependence on renal dialysis: Secondary | ICD-10-CM | POA: Diagnosis not present

## 2021-11-15 DIAGNOSIS — N2581 Secondary hyperparathyroidism of renal origin: Secondary | ICD-10-CM | POA: Diagnosis not present

## 2021-11-15 DIAGNOSIS — N186 End stage renal disease: Secondary | ICD-10-CM | POA: Diagnosis not present

## 2021-11-18 DIAGNOSIS — N2581 Secondary hyperparathyroidism of renal origin: Secondary | ICD-10-CM | POA: Diagnosis not present

## 2021-11-18 DIAGNOSIS — Z992 Dependence on renal dialysis: Secondary | ICD-10-CM | POA: Diagnosis not present

## 2021-11-18 DIAGNOSIS — N186 End stage renal disease: Secondary | ICD-10-CM | POA: Diagnosis not present

## 2021-11-19 DIAGNOSIS — N186 End stage renal disease: Secondary | ICD-10-CM | POA: Diagnosis not present

## 2021-11-19 DIAGNOSIS — Z992 Dependence on renal dialysis: Secondary | ICD-10-CM | POA: Diagnosis not present

## 2021-11-19 DIAGNOSIS — N179 Acute kidney failure, unspecified: Secondary | ICD-10-CM | POA: Diagnosis not present

## 2021-11-20 DIAGNOSIS — Z992 Dependence on renal dialysis: Secondary | ICD-10-CM | POA: Diagnosis not present

## 2021-11-20 DIAGNOSIS — N186 End stage renal disease: Secondary | ICD-10-CM | POA: Diagnosis not present

## 2021-11-20 DIAGNOSIS — N2581 Secondary hyperparathyroidism of renal origin: Secondary | ICD-10-CM | POA: Diagnosis not present

## 2021-11-21 DIAGNOSIS — I7782 Antineutrophilic cytoplasmic antibody (ANCA) vasculitis: Secondary | ICD-10-CM | POA: Diagnosis not present

## 2021-11-21 DIAGNOSIS — R918 Other nonspecific abnormal finding of lung field: Secondary | ICD-10-CM | POA: Diagnosis not present

## 2021-11-21 DIAGNOSIS — N186 End stage renal disease: Secondary | ICD-10-CM | POA: Diagnosis not present

## 2021-11-22 DIAGNOSIS — Z992 Dependence on renal dialysis: Secondary | ICD-10-CM | POA: Diagnosis not present

## 2021-11-22 DIAGNOSIS — N2581 Secondary hyperparathyroidism of renal origin: Secondary | ICD-10-CM | POA: Diagnosis not present

## 2021-11-22 DIAGNOSIS — N186 End stage renal disease: Secondary | ICD-10-CM | POA: Diagnosis not present

## 2021-11-25 DIAGNOSIS — Z992 Dependence on renal dialysis: Secondary | ICD-10-CM | POA: Diagnosis not present

## 2021-11-25 DIAGNOSIS — N2581 Secondary hyperparathyroidism of renal origin: Secondary | ICD-10-CM | POA: Diagnosis not present

## 2021-11-25 DIAGNOSIS — N186 End stage renal disease: Secondary | ICD-10-CM | POA: Diagnosis not present

## 2021-11-26 DIAGNOSIS — Z992 Dependence on renal dialysis: Secondary | ICD-10-CM | POA: Diagnosis not present

## 2021-11-26 DIAGNOSIS — N186 End stage renal disease: Secondary | ICD-10-CM | POA: Diagnosis not present

## 2021-11-26 DIAGNOSIS — J189 Pneumonia, unspecified organism: Secondary | ICD-10-CM | POA: Diagnosis not present

## 2021-11-26 DIAGNOSIS — I7782 Antineutrophilic cytoplasmic antibody (ANCA) vasculitis: Secondary | ICD-10-CM | POA: Diagnosis not present

## 2021-11-26 DIAGNOSIS — J9809 Other diseases of bronchus, not elsewhere classified: Secondary | ICD-10-CM | POA: Diagnosis not present

## 2021-11-26 DIAGNOSIS — I509 Heart failure, unspecified: Secondary | ICD-10-CM | POA: Diagnosis not present

## 2021-11-26 DIAGNOSIS — R918 Other nonspecific abnormal finding of lung field: Secondary | ICD-10-CM | POA: Diagnosis not present

## 2021-11-26 DIAGNOSIS — E039 Hypothyroidism, unspecified: Secondary | ICD-10-CM | POA: Diagnosis not present

## 2021-11-26 DIAGNOSIS — J984 Other disorders of lung: Secondary | ICD-10-CM | POA: Diagnosis not present

## 2021-11-26 DIAGNOSIS — I132 Hypertensive heart and chronic kidney disease with heart failure and with stage 5 chronic kidney disease, or end stage renal disease: Secondary | ICD-10-CM | POA: Diagnosis not present

## 2021-11-26 DIAGNOSIS — D849 Immunodeficiency, unspecified: Secondary | ICD-10-CM | POA: Diagnosis not present

## 2021-11-27 DIAGNOSIS — N2581 Secondary hyperparathyroidism of renal origin: Secondary | ICD-10-CM | POA: Diagnosis not present

## 2021-11-27 DIAGNOSIS — Z992 Dependence on renal dialysis: Secondary | ICD-10-CM | POA: Diagnosis not present

## 2021-11-27 DIAGNOSIS — N186 End stage renal disease: Secondary | ICD-10-CM | POA: Diagnosis not present

## 2021-11-29 DIAGNOSIS — N2581 Secondary hyperparathyroidism of renal origin: Secondary | ICD-10-CM | POA: Diagnosis not present

## 2021-11-29 DIAGNOSIS — Z992 Dependence on renal dialysis: Secondary | ICD-10-CM | POA: Diagnosis not present

## 2021-11-29 DIAGNOSIS — N186 End stage renal disease: Secondary | ICD-10-CM | POA: Diagnosis not present

## 2021-12-02 DIAGNOSIS — N186 End stage renal disease: Secondary | ICD-10-CM | POA: Diagnosis not present

## 2021-12-02 DIAGNOSIS — Z992 Dependence on renal dialysis: Secondary | ICD-10-CM | POA: Diagnosis not present

## 2021-12-02 DIAGNOSIS — N2581 Secondary hyperparathyroidism of renal origin: Secondary | ICD-10-CM | POA: Diagnosis not present

## 2021-12-04 DIAGNOSIS — Z992 Dependence on renal dialysis: Secondary | ICD-10-CM | POA: Diagnosis not present

## 2021-12-04 DIAGNOSIS — N186 End stage renal disease: Secondary | ICD-10-CM | POA: Diagnosis not present

## 2021-12-04 DIAGNOSIS — N2581 Secondary hyperparathyroidism of renal origin: Secondary | ICD-10-CM | POA: Diagnosis not present

## 2021-12-06 DIAGNOSIS — N186 End stage renal disease: Secondary | ICD-10-CM | POA: Diagnosis not present

## 2021-12-06 DIAGNOSIS — Z992 Dependence on renal dialysis: Secondary | ICD-10-CM | POA: Diagnosis not present

## 2021-12-06 DIAGNOSIS — N2581 Secondary hyperparathyroidism of renal origin: Secondary | ICD-10-CM | POA: Diagnosis not present

## 2021-12-09 DIAGNOSIS — N2581 Secondary hyperparathyroidism of renal origin: Secondary | ICD-10-CM | POA: Diagnosis not present

## 2021-12-09 DIAGNOSIS — N186 End stage renal disease: Secondary | ICD-10-CM | POA: Diagnosis not present

## 2021-12-09 DIAGNOSIS — Z992 Dependence on renal dialysis: Secondary | ICD-10-CM | POA: Diagnosis not present

## 2021-12-11 DIAGNOSIS — Z992 Dependence on renal dialysis: Secondary | ICD-10-CM | POA: Diagnosis not present

## 2021-12-11 DIAGNOSIS — N2581 Secondary hyperparathyroidism of renal origin: Secondary | ICD-10-CM | POA: Diagnosis not present

## 2021-12-11 DIAGNOSIS — N186 End stage renal disease: Secondary | ICD-10-CM | POA: Diagnosis not present

## 2021-12-12 DIAGNOSIS — J189 Pneumonia, unspecified organism: Secondary | ICD-10-CM | POA: Diagnosis not present

## 2021-12-12 DIAGNOSIS — Z01811 Encounter for preprocedural respiratory examination: Secondary | ICD-10-CM | POA: Diagnosis not present

## 2021-12-12 DIAGNOSIS — I7782 Antineutrophilic cytoplasmic antibody (ANCA) vasculitis: Secondary | ICD-10-CM | POA: Diagnosis not present

## 2021-12-12 DIAGNOSIS — Z992 Dependence on renal dialysis: Secondary | ICD-10-CM | POA: Diagnosis not present

## 2021-12-12 DIAGNOSIS — Z23 Encounter for immunization: Secondary | ICD-10-CM | POA: Diagnosis not present

## 2021-12-12 DIAGNOSIS — Z01818 Encounter for other preprocedural examination: Secondary | ICD-10-CM | POA: Diagnosis not present

## 2021-12-12 DIAGNOSIS — N186 End stage renal disease: Secondary | ICD-10-CM | POA: Diagnosis not present

## 2021-12-13 DIAGNOSIS — N186 End stage renal disease: Secondary | ICD-10-CM | POA: Diagnosis not present

## 2021-12-13 DIAGNOSIS — N2581 Secondary hyperparathyroidism of renal origin: Secondary | ICD-10-CM | POA: Diagnosis not present

## 2021-12-13 DIAGNOSIS — Z992 Dependence on renal dialysis: Secondary | ICD-10-CM | POA: Diagnosis not present

## 2021-12-16 DIAGNOSIS — N186 End stage renal disease: Secondary | ICD-10-CM | POA: Diagnosis not present

## 2021-12-16 DIAGNOSIS — Z992 Dependence on renal dialysis: Secondary | ICD-10-CM | POA: Diagnosis not present

## 2021-12-16 DIAGNOSIS — N2581 Secondary hyperparathyroidism of renal origin: Secondary | ICD-10-CM | POA: Diagnosis not present

## 2021-12-18 DIAGNOSIS — Z992 Dependence on renal dialysis: Secondary | ICD-10-CM | POA: Diagnosis not present

## 2021-12-18 DIAGNOSIS — N186 End stage renal disease: Secondary | ICD-10-CM | POA: Diagnosis not present

## 2021-12-18 DIAGNOSIS — N2581 Secondary hyperparathyroidism of renal origin: Secondary | ICD-10-CM | POA: Diagnosis not present

## 2021-12-20 DIAGNOSIS — N186 End stage renal disease: Secondary | ICD-10-CM | POA: Diagnosis not present

## 2021-12-20 DIAGNOSIS — Z992 Dependence on renal dialysis: Secondary | ICD-10-CM | POA: Diagnosis not present

## 2021-12-20 DIAGNOSIS — N2581 Secondary hyperparathyroidism of renal origin: Secondary | ICD-10-CM | POA: Diagnosis not present

## 2021-12-20 DIAGNOSIS — N019 Rapidly progressive nephritic syndrome with unspecified morphologic changes: Secondary | ICD-10-CM | POA: Diagnosis not present

## 2021-12-23 DIAGNOSIS — Z992 Dependence on renal dialysis: Secondary | ICD-10-CM | POA: Diagnosis not present

## 2021-12-23 DIAGNOSIS — N186 End stage renal disease: Secondary | ICD-10-CM | POA: Diagnosis not present

## 2021-12-23 DIAGNOSIS — N2581 Secondary hyperparathyroidism of renal origin: Secondary | ICD-10-CM | POA: Diagnosis not present

## 2021-12-25 DIAGNOSIS — N2581 Secondary hyperparathyroidism of renal origin: Secondary | ICD-10-CM | POA: Diagnosis not present

## 2021-12-25 DIAGNOSIS — N186 End stage renal disease: Secondary | ICD-10-CM | POA: Diagnosis not present

## 2021-12-25 DIAGNOSIS — Z992 Dependence on renal dialysis: Secondary | ICD-10-CM | POA: Diagnosis not present

## 2021-12-27 DIAGNOSIS — N2581 Secondary hyperparathyroidism of renal origin: Secondary | ICD-10-CM | POA: Diagnosis not present

## 2021-12-27 DIAGNOSIS — Z992 Dependence on renal dialysis: Secondary | ICD-10-CM | POA: Diagnosis not present

## 2021-12-27 DIAGNOSIS — N186 End stage renal disease: Secondary | ICD-10-CM | POA: Diagnosis not present

## 2021-12-30 DIAGNOSIS — N186 End stage renal disease: Secondary | ICD-10-CM | POA: Diagnosis not present

## 2021-12-30 DIAGNOSIS — N2581 Secondary hyperparathyroidism of renal origin: Secondary | ICD-10-CM | POA: Diagnosis not present

## 2021-12-30 DIAGNOSIS — Z992 Dependence on renal dialysis: Secondary | ICD-10-CM | POA: Diagnosis not present

## 2022-01-01 DIAGNOSIS — N2581 Secondary hyperparathyroidism of renal origin: Secondary | ICD-10-CM | POA: Diagnosis not present

## 2022-01-01 DIAGNOSIS — N186 End stage renal disease: Secondary | ICD-10-CM | POA: Diagnosis not present

## 2022-01-01 DIAGNOSIS — Z992 Dependence on renal dialysis: Secondary | ICD-10-CM | POA: Diagnosis not present

## 2022-01-03 DIAGNOSIS — Z992 Dependence on renal dialysis: Secondary | ICD-10-CM | POA: Diagnosis not present

## 2022-01-03 DIAGNOSIS — N186 End stage renal disease: Secondary | ICD-10-CM | POA: Diagnosis not present

## 2022-01-03 DIAGNOSIS — N2581 Secondary hyperparathyroidism of renal origin: Secondary | ICD-10-CM | POA: Diagnosis not present

## 2022-01-06 DIAGNOSIS — N186 End stage renal disease: Secondary | ICD-10-CM | POA: Diagnosis not present

## 2022-01-06 DIAGNOSIS — N2581 Secondary hyperparathyroidism of renal origin: Secondary | ICD-10-CM | POA: Diagnosis not present

## 2022-01-06 DIAGNOSIS — Z992 Dependence on renal dialysis: Secondary | ICD-10-CM | POA: Diagnosis not present

## 2022-01-08 DIAGNOSIS — Z992 Dependence on renal dialysis: Secondary | ICD-10-CM | POA: Diagnosis not present

## 2022-01-08 DIAGNOSIS — N2581 Secondary hyperparathyroidism of renal origin: Secondary | ICD-10-CM | POA: Diagnosis not present

## 2022-01-08 DIAGNOSIS — N186 End stage renal disease: Secondary | ICD-10-CM | POA: Diagnosis not present

## 2022-01-10 DIAGNOSIS — Z992 Dependence on renal dialysis: Secondary | ICD-10-CM | POA: Diagnosis not present

## 2022-01-10 DIAGNOSIS — N186 End stage renal disease: Secondary | ICD-10-CM | POA: Diagnosis not present

## 2022-01-10 DIAGNOSIS — N2581 Secondary hyperparathyroidism of renal origin: Secondary | ICD-10-CM | POA: Diagnosis not present

## 2022-01-13 DIAGNOSIS — N186 End stage renal disease: Secondary | ICD-10-CM | POA: Diagnosis not present

## 2022-01-13 DIAGNOSIS — N2581 Secondary hyperparathyroidism of renal origin: Secondary | ICD-10-CM | POA: Diagnosis not present

## 2022-01-13 DIAGNOSIS — Z992 Dependence on renal dialysis: Secondary | ICD-10-CM | POA: Diagnosis not present

## 2022-01-15 DIAGNOSIS — Z992 Dependence on renal dialysis: Secondary | ICD-10-CM | POA: Diagnosis not present

## 2022-01-15 DIAGNOSIS — N2581 Secondary hyperparathyroidism of renal origin: Secondary | ICD-10-CM | POA: Diagnosis not present

## 2022-01-15 DIAGNOSIS — N186 End stage renal disease: Secondary | ICD-10-CM | POA: Diagnosis not present

## 2022-01-17 DIAGNOSIS — Z992 Dependence on renal dialysis: Secondary | ICD-10-CM | POA: Diagnosis not present

## 2022-01-17 DIAGNOSIS — N186 End stage renal disease: Secondary | ICD-10-CM | POA: Diagnosis not present

## 2022-01-17 DIAGNOSIS — N2581 Secondary hyperparathyroidism of renal origin: Secondary | ICD-10-CM | POA: Diagnosis not present

## 2022-01-19 DIAGNOSIS — N186 End stage renal disease: Secondary | ICD-10-CM | POA: Diagnosis not present

## 2022-01-19 DIAGNOSIS — Z992 Dependence on renal dialysis: Secondary | ICD-10-CM | POA: Diagnosis not present

## 2022-01-19 DIAGNOSIS — N019 Rapidly progressive nephritic syndrome with unspecified morphologic changes: Secondary | ICD-10-CM | POA: Diagnosis not present

## 2022-01-20 DIAGNOSIS — N2581 Secondary hyperparathyroidism of renal origin: Secondary | ICD-10-CM | POA: Diagnosis not present

## 2022-01-20 DIAGNOSIS — N186 End stage renal disease: Secondary | ICD-10-CM | POA: Diagnosis not present

## 2022-01-20 DIAGNOSIS — Z992 Dependence on renal dialysis: Secondary | ICD-10-CM | POA: Diagnosis not present

## 2022-01-22 DIAGNOSIS — Z992 Dependence on renal dialysis: Secondary | ICD-10-CM | POA: Diagnosis not present

## 2022-01-22 DIAGNOSIS — N2581 Secondary hyperparathyroidism of renal origin: Secondary | ICD-10-CM | POA: Diagnosis not present

## 2022-01-22 DIAGNOSIS — N186 End stage renal disease: Secondary | ICD-10-CM | POA: Diagnosis not present

## 2022-01-24 DIAGNOSIS — N2581 Secondary hyperparathyroidism of renal origin: Secondary | ICD-10-CM | POA: Diagnosis not present

## 2022-01-24 DIAGNOSIS — Z992 Dependence on renal dialysis: Secondary | ICD-10-CM | POA: Diagnosis not present

## 2022-01-24 DIAGNOSIS — N186 End stage renal disease: Secondary | ICD-10-CM | POA: Diagnosis not present

## 2022-01-27 DIAGNOSIS — Z992 Dependence on renal dialysis: Secondary | ICD-10-CM | POA: Diagnosis not present

## 2022-01-27 DIAGNOSIS — N186 End stage renal disease: Secondary | ICD-10-CM | POA: Diagnosis not present

## 2022-01-27 DIAGNOSIS — N2581 Secondary hyperparathyroidism of renal origin: Secondary | ICD-10-CM | POA: Diagnosis not present

## 2022-01-29 DIAGNOSIS — N186 End stage renal disease: Secondary | ICD-10-CM | POA: Diagnosis not present

## 2022-01-29 DIAGNOSIS — N2581 Secondary hyperparathyroidism of renal origin: Secondary | ICD-10-CM | POA: Diagnosis not present

## 2022-01-29 DIAGNOSIS — Z992 Dependence on renal dialysis: Secondary | ICD-10-CM | POA: Diagnosis not present

## 2022-01-31 DIAGNOSIS — Z992 Dependence on renal dialysis: Secondary | ICD-10-CM | POA: Diagnosis not present

## 2022-01-31 DIAGNOSIS — N186 End stage renal disease: Secondary | ICD-10-CM | POA: Diagnosis not present

## 2022-01-31 DIAGNOSIS — N2581 Secondary hyperparathyroidism of renal origin: Secondary | ICD-10-CM | POA: Diagnosis not present

## 2022-02-03 DIAGNOSIS — N2581 Secondary hyperparathyroidism of renal origin: Secondary | ICD-10-CM | POA: Diagnosis not present

## 2022-02-03 DIAGNOSIS — N186 End stage renal disease: Secondary | ICD-10-CM | POA: Diagnosis not present

## 2022-02-03 DIAGNOSIS — Z992 Dependence on renal dialysis: Secondary | ICD-10-CM | POA: Diagnosis not present

## 2022-02-04 DIAGNOSIS — I2510 Atherosclerotic heart disease of native coronary artery without angina pectoris: Secondary | ICD-10-CM | POA: Diagnosis not present

## 2022-02-04 DIAGNOSIS — J8489 Other specified interstitial pulmonary diseases: Secondary | ICD-10-CM | POA: Diagnosis not present

## 2022-02-04 DIAGNOSIS — N186 End stage renal disease: Secondary | ICD-10-CM | POA: Diagnosis not present

## 2022-02-04 DIAGNOSIS — R918 Other nonspecific abnormal finding of lung field: Secondary | ICD-10-CM | POA: Diagnosis not present

## 2022-02-04 DIAGNOSIS — J189 Pneumonia, unspecified organism: Secondary | ICD-10-CM | POA: Diagnosis not present

## 2022-02-04 DIAGNOSIS — Z992 Dependence on renal dialysis: Secondary | ICD-10-CM | POA: Diagnosis not present

## 2022-02-04 DIAGNOSIS — J849 Interstitial pulmonary disease, unspecified: Secondary | ICD-10-CM | POA: Diagnosis not present

## 2022-02-04 DIAGNOSIS — I7782 Antineutrophilic cytoplasmic antibody (ANCA) vasculitis: Secondary | ICD-10-CM | POA: Diagnosis not present

## 2022-02-05 DIAGNOSIS — N186 End stage renal disease: Secondary | ICD-10-CM | POA: Diagnosis not present

## 2022-02-05 DIAGNOSIS — N2581 Secondary hyperparathyroidism of renal origin: Secondary | ICD-10-CM | POA: Diagnosis not present

## 2022-02-05 DIAGNOSIS — Z992 Dependence on renal dialysis: Secondary | ICD-10-CM | POA: Diagnosis not present

## 2022-02-07 DIAGNOSIS — N186 End stage renal disease: Secondary | ICD-10-CM | POA: Diagnosis not present

## 2022-02-07 DIAGNOSIS — Z992 Dependence on renal dialysis: Secondary | ICD-10-CM | POA: Diagnosis not present

## 2022-02-07 DIAGNOSIS — N2581 Secondary hyperparathyroidism of renal origin: Secondary | ICD-10-CM | POA: Diagnosis not present

## 2022-02-10 DIAGNOSIS — Z992 Dependence on renal dialysis: Secondary | ICD-10-CM | POA: Diagnosis not present

## 2022-02-10 DIAGNOSIS — N2581 Secondary hyperparathyroidism of renal origin: Secondary | ICD-10-CM | POA: Diagnosis not present

## 2022-02-10 DIAGNOSIS — N186 End stage renal disease: Secondary | ICD-10-CM | POA: Diagnosis not present

## 2022-02-12 DIAGNOSIS — N2581 Secondary hyperparathyroidism of renal origin: Secondary | ICD-10-CM | POA: Diagnosis not present

## 2022-02-12 DIAGNOSIS — Z992 Dependence on renal dialysis: Secondary | ICD-10-CM | POA: Diagnosis not present

## 2022-02-12 DIAGNOSIS — N186 End stage renal disease: Secondary | ICD-10-CM | POA: Diagnosis not present

## 2022-02-13 ENCOUNTER — Ambulatory Visit: Payer: Medicare PPO | Admitting: Cardiovascular Disease

## 2022-02-13 ENCOUNTER — Encounter: Payer: Self-pay | Admitting: Cardiovascular Disease

## 2022-02-13 VITALS — BP 150/90 | HR 87 | Ht 66.0 in | Wt 127.8 lb

## 2022-02-13 DIAGNOSIS — N186 End stage renal disease: Secondary | ICD-10-CM | POA: Diagnosis not present

## 2022-02-13 DIAGNOSIS — J8489 Other specified interstitial pulmonary diseases: Secondary | ICD-10-CM | POA: Diagnosis not present

## 2022-02-13 DIAGNOSIS — I5022 Chronic systolic (congestive) heart failure: Secondary | ICD-10-CM | POA: Diagnosis not present

## 2022-02-13 DIAGNOSIS — I340 Nonrheumatic mitral (valve) insufficiency: Secondary | ICD-10-CM

## 2022-02-13 NOTE — Progress Notes (Signed)
Cardiology Office Note:    Date:  02/13/2022   ID:  Jasmine White, DOB [birth year], MRN [id number]  PCP:  Leeroy Cha, MD   [facility] Cardiologist:  Sherren Mocha, MD     Referring MD: Leeroy Cha,*   No chief complaint on file.   History of Present Illness:    Jasmine White is a 76 y.o. female with a hx of chronic HFrEF, NICM,  HTN, hypothyroid, positive ANCA rapid progressive glomerulonephritis,  ESRD on HD, interstitial lung disease, and moderate MR.    The patient had a prolonged hospitalization in 2021 when she presented with acute renal failure and creatinine greater than 7.  She was found to have ANCA vasculitis and required mechanical ventilation and initiation of immunosuppressive therapy.  She had a prolonged hospitalization of 40 days later that same year.  She underwent right and left heart catheterization and 2022 after being found to have severe LV dysfunction with moderate to severe mitral regurgitation.  This demonstrated normal coronary arteries and normal intracardiac filling pressures.  The patient has developed end-stage renal disease and is on the renal transplant list in [place].    The patient is here with her daughter today.  She reports that she was scheduled for a renal transplant in February but was found on preoperative imaging to have pneumonia.  She has undergone treatment but her most recent CT scan again demonstrated multifocal pneumonia and her antibiotic therapy has been tailored and continued.  She otherwise is doing well.  She denies recent fever or chills.  She denies shortness of breath, leg edema, orthopnea, PND, or chest pain.  She recently was able to walk 3 miles continuously without rest.  Since I saw her initially in the hospital in April 2022, she also underwent cardiac evaluation at [facility] clinic as part of her renal transplant evaluation.  An echocardiogram there demonstrated normal left ventricular  function, and significant mitral stenosis with severe MAC, and mild mitral regurgitation.  She was also noted to have mild to moderate aortic insufficiency and mild aortic stenosis.  She did not have any severe valvular disease identified.  Most recently, she has been having issues with labile blood pressures, predominantly low blood pressure on dialysis days.  She holds carvedilol on dialysis days.  Past Medical History:  Diagnosis Date   Arthritis    Bruises easily    Cancer ([place])    basal skin cancer   Chronic back pain    HNP/stenosis and radiculopathy   Hyperlipidemia    takes Pravastatin daily   Hypertension    takes Hyzaar and toprol daily   Hypothyroidism    takes Synthroid daily   Insomnia    takes Elevil nightly   Joint pain    Joint swelling    Low BP    past some sedation   Nocturia    PONV (postoperative nausea and vomiting)    with knee replacement b/p dropped   Sinusitis    finished zpak yesterday   Urinary frequency     Past Surgical History:  Procedure Laterality Date   A/V FISTULAGRAM N/A 05/17/2020   Procedure: A/V FISTULAGRAM - Left Arm;  Surgeon: Marty Heck, MD;  Location: [place];  Service: Cardiovascular;  Laterality: N/A;   ABDOMINAL HYSTERECTOMY     AV FISTULA PLACEMENT Left 04/02/2020   Procedure: ARTERIOVENOUS (AV) FISTULA CREATION;  Surgeon: Rosetta Posner, MD;  Location: [place] [facility];  Service: Vascular;  Laterality: Left;  BACK SURGERY     basal cell skin cancer     on face and forehead   bil knee replacements     BREAST BIOPSY     left breast/benign   COLONOSCOPY     IR FLUORO GUIDE CV LINE RIGHT  03/20/2020   IR US GUIDE VASC ACCESS RIGHT  03/20/2020   LUMBAR LAMINECTOMY/DECOMPRESSION MICRODISCECTOMY  08/13/2012   Procedure: LUMBAR LAMINECTOMY/DECOMPRESSION MICRODISCECTOMY 1 LEVEL;  Surgeon: Erline Levine, MD;  Location: [facility];  Service: Neurosurgery;  Laterality: Left;  Left lumbar one-two Microdiskectomy   PERIPHERAL  VASCULAR BALLOON ANGIOPLASTY Left 05/17/2020   Procedure: PERIPHERAL VASCULAR BALLOON ANGIOPLASTY;  Surgeon: Marty Heck, MD;  Location: [facility];  Service: Cardiovascular;  Laterality: Left;  Arm fistula    RIGHT/LEFT HEART CATH AND CORONARY ANGIOGRAPHY N/A 01/17/2021   Procedure: RIGHT/LEFT HEART CATH AND CORONARY ANGIOGRAPHY;  Surgeon: Lorretta Harp, MD;  Location: [place] [facility];  Service: Cardiovascular;  Laterality: N/A;    Current Medications: Current Meds  Medication Sig   acetaminophen (TYLENOL) 500 MG tablet Take 500-1,000 mg by mouth daily as needed (for pain).   albuterol (VENTOLIN HFA) 108 (90 Base) MCG/ACT inhaler Inhale 2 puffs into the lungs every 6 (six) hours as needed for wheezing or shortness of breath.   b complex-vitamin c-folic acid (NEPHRO-VITE) 0.8 MG TABS tablet Take 1 tablet by mouth at bedtime.   carvedilol (COREG) 3.125 MG tablet Take 1 tablet (3.125 mg total) by mouth 2 (two) times daily with a meal. Pt will hold on Dialysis days   escitalopram (LEXAPRO) 20 MG tablet Take 20 mg by mouth daily.   HYDROcodone-acetaminophen (NORCO/VICODIN) 5-325 MG tablet Take 0.5 tablets by mouth at bedtime as needed (for pain).   levofloxacin (LEVAQUIN) 500 MG tablet Take 500 mg by mouth daily. Taking for 14 days   loratadine (CLARITIN) 10 MG tablet Take 10 mg by mouth daily.   Methoxy PEG-Epoetin Beta (MIRCERA IJ) Mircera   predniSONE (DELTASONE) 20 MG tablet Take 20 mg by mouth daily with breakfast. Taking 40 mg until March 18, 2022   sevelamer carbonate (RENVELA) 800 MG tablet Take 800 mg by mouth 3 (three) times daily with meals. Patient take 4-800 mg by mouth at each meal   SYNTHROID 88 MCG tablet TAKE 1 TABLET BY MOUTH ONCE DAILY BEFORE BREAKFAST   valACYclovir (VALTREX) 500 MG tablet TAKE 1 TABLET BY MOUTH AS DIRECTED 1 TABLET BY MOUTH ON DIALYSIS DAYS ONLY   zolpidem (AMBIEN) 5 MG tablet Take 1 tablet by mouth at bedtime.     Allergies:    Hydrochlorothiazide, Other, Percocet [oxycodone-acetaminophen], and Tramadol   Social History   Socioeconomic History   Marital status: Married    Spouse name: Jasmine White   Number of children: 2   Years of education: Not on file   Highest education level: Not on file  Occupational History   Occupation: retired  Tobacco Use   Smoking status: Never   Smokeless tobacco: Never  Vaping Use   Vaping Use: Never used  Substance and Sexual Activity   Alcohol use: No    Alcohol/week: 0.0 standard drinks   Drug use: No   Sexual activity: Not Currently  Other Topics Concern   Not on file  Social History Narrative   Not on file   Social Determinants of Health   Financial Resource Strain: Not on file  Food Insecurity: Not on file  Transportation Needs: Not on file  Physical  Activity: Not on file  Stress: Not on file  Social Connections: Not on file     Family History: The patient's family history includes Alcohol abuse in her father; Arthritis in her brother and brother; Cancer (age of onset: 76) in her brother; Heart disease in her father and mother; Hypertension in her mother; Stroke in her father. There is no history of Colon cancer, Colon polyps, Stomach cancer, or Rectal cancer.  ROS:   Please see the history of present illness.    All other systems reviewed and are negative.  EKGs/Labs/Other Studies Reviewed:    The following studies were reviewed today: Echo 06/13/2021: 1. Left ventricular ejection fraction, by estimation, is 40 to 45%. The  left ventricle has mildly decreased function. The left ventricle  demonstrates global hypokinesis. There is mild concentric left ventricular  hypertrophy. Left ventricular diastolic  parameters are indeterminate.   2. Right ventricular systolic function is normal. The right ventricular  size is normal. Tricuspid regurgitation signal is inadequate for assessing  PA pressure.   3. Left atrial size was mildly dilated.   4. The mitral valve  is abnormal. Moderate mitral valve regurgitation.  Moderate mitral annular calcification.   5. The aortic valve is tricuspid. There is moderate calcification of the  aortic valve. There is mild thickening of the aortic valve. Aortic valve  regurgitation is moderate. Mild to moderate aortic valve  sclerosis/calcification is present, without any  evidence of aortic stenosis.   6. There is borderline dilatation of the ascending aorta, measuring 38  mm.   7. The inferior vena cava is normal in size with greater than 50%  respiratory variability, suggesting right atrial pressure of 3 mmHg.   Comparison(s): Prior images reviewed side by side. Changes from prior  study are noted. Compared to prior, EF improved, MR remains eccentric but  appears moderate. Moderate aortic regurgitation unchanged.   [place] 01/17/2021: 1: Right atrial pressure-4/1 2: Right ventricular pressure-28/0 3: Pulmonary artery pressure-29/7, mean 17 4: Pulmonary wedge pressure-A-wave 12, V wave 11, mean 9 5: LVEDP-16 6: Cardiac output-4.64 L/min with an index of 2.99 L/min/m by Fick, 3.9 L/min with an index of 2.5 L/min/m by thermodilution  Echo ([place]) 11/05/21: CONCLUSIONS   1. Left ventricle: The left ventricular cavity size is normal. Wall     thickness is normal. Systolic function is normal by the biplane     method of disks. The ejection fraction is 58% (+/-5%). No     segmental wall motion abnormalities.  2. Right ventricle: The right ventricular cavity size is normal.     Systolic function is normal.  3. Aortic valve: The aortic valve is tricuspid with mildly to     moderately thickened and calcified leaflets. There is mild     aortic stenosis. There is mild to moderate aortic regurgitation.  4. Mitral valve: Mildly to moderately thickened leaflets. Moderete     to severe posterior mitral annular calcification. Calcified     chordae are also identified. There is insignificant mitral     stenosis. There is  mild mitral regurgitation.   No previous study was available for comparison.   EKG:  EKG is ordered today.  The ekg ordered today demonstrates normal sinus rhythm 87 bpm, biatrial enlargement, left axis deviation  Recent Labs: No results found for requested labs within last 8760 hours.  Recent Lipid Panel    Component Value Date/Time   CHOL 130 01/17/2021 1020   CHOL 173 03/01/2020 0927   CHOL  169 12/21/2012 1552   TRIG 84 01/17/2021 1020   TRIG 97 12/26/2014 1150   TRIG 64 12/21/2012 1552   HDL 60 01/17/2021 1020   HDL 67 03/01/2020 0927   HDL 75 12/26/2014 1150   HDL 63 12/21/2012 1552   CHOLHDL 2.2 01/17/2021 1020   VLDL 17 01/17/2021 1020   LDLCALC 53 01/17/2021 1020   LDLCALC 94 03/01/2020 0927   LDLCALC 102 (H) 07/06/2014 0912   LDLCALC 93 12/21/2012 1552     Risk Assessment/Calculations:           Physical Exam:    VS:  BP (!) 150/90   Pulse 87   Ht _0  (1.676 m)   Wt 127 lb 12.8 oz (58 kg)   SpO2 97%   BMI 20.63 kg/m     Wt Readings from Last 3 Encounters:  02/13/22 127 lb 12.8 oz (58 kg)  10/01/21 122 lb 3.2 oz (55.4 kg)  08/05/21 114 lb 3.2 oz (51.8 kg)     GEN: Well nourished, well developed in no acute distress HEENT: Normal NECK: No JVD; No carotid bruits LYMPHATICS: No lymphadenopathy CARDIAC: Well RRR, 2/6 systolic murmur heard throughout RESPIRATORY:  Clear to auscultation without rales, wheezing or rhonchi  ABDOMEN: Soft, non-tender, non-distended MUSCULOSKELETAL:  No edema; No deformity  SKIN: Warm and dry NEUROLOGIC:  Alert and oriented x 3 PSYCHIATRIC:  Normal affect   ASSESSMENT:    1. Chronic HFrEF (heart failure with reduced ejection fraction) ([place])   2. Moderate mitral regurgitation   3. ESRD (end stage renal disease) ([place])    PLAN:    In order of problems listed above:  The patient's left ventricular function has recovered based on most recent echo assessment.  She will continue on low-dose carvedilol and will hold  this on dialysis days.  I reviewed her blood pressure readings and I think the current strategy of low-dose carvedilol, holding on dialysis days is the best approach. Stability noted on most recent echo from February.  Recommend clinical follow-up in 6 months and a repeat echocardiogram in 1 year. Treated with hemodialysis at present, hoping to undergo renal transplantation in the near future once her pneumonia clears radiographically.      Medication Adjustments/Labs and Tests Ordered: Current medicines are reviewed at length with the patient today.  Concerns regarding medicines are outlined above.  Orders Placed This Encounter  Procedures   EKG 12-Lead   No orders of the defined types were placed in this encounter.   Patient Instructions  Medication Instructions:  Your physician recommends that you continue on your current medications as directed. Please refer to the Current Medication list given to you today.  *If you need a refill on your cardiac medications before your next appointment, please call your pharmacy*   Lab Work: NONE If you have labs (blood work) drawn today and your tests are completely normal, you will receive your results only by: [place] (if you have MyChart) OR A paper copy in the mail If you have any lab test that is abnormal or we need to change your treatment, we will call you to review the results.   Testing/Procedures: NONE   Follow-Up: At [facility], you and your health needs are our priority.  As part of our continuing mission to provide you with exceptional heart care, we have created designated Provider Care Teams.  These Care Teams include your primary Cardiologist (physician) and Advanced Practice Providers (APPs -  Physician Assistants and Nurse  Practitioners) who all work together to provide you with the care you need, when you need it.  We recommend signing up for the patient portal called "MyChart".  Sign up information is  provided on this After Visit Summary.  MyChart is used to connect with patients for Virtual Visits (Telemedicine).  Patients are able to view lab/test results, encounter notes, upcoming appointments, etc.  Non-urgent messages can be sent to your provider as well.   To learn more about what you can do with MyChart, go to [email or web address].    Your next appointment:   6 month(s)  The format for your next appointment:   In Person  Provider:   Christen Bame, Richardson Dopp, or [employer]. Then, Sherren Mocha, MD  will plan to see you in 1 year     Important Information About Sugar         Signed, Sherren Mocha, MD  02/13/2022 8:30 AM    [place]

## 2022-02-13 NOTE — Patient Instructions (Signed)
Medication Instructions:  Your physician recommends that you continue on your current medications as directed. Please refer to the Current Medication list given to you today.  *If you need a refill on your cardiac medications before your next appointment, please call your pharmacy*   Lab Work: NONE If you have labs (blood work) drawn today and your tests are completely normal, you will receive your results only by: [place] (if you have MyChart) OR A paper copy in the mail If you have any lab test that is abnormal or we need to change your treatment, we will call you to review the results.   Testing/Procedures: NONE   Follow-Up: At [facility], you and your health needs are our priority.  As part of our continuing mission to provide you with exceptional heart care, we have created designated Provider Care Teams.  These Care Teams include your primary Cardiologist (physician) and Advanced Practice Providers (APPs -  Physician Assistants and Nurse Practitioners) who all work together to provide you with the care you need, when you need it.  We recommend signing up for the patient portal called "MyChart".  Sign up information is provided on this After Visit Summary.  MyChart is used to connect with patients for Virtual Visits (Telemedicine).  Patients are able to view lab/test results, encounter notes, upcoming appointments, etc.  Non-urgent messages can be sent to your provider as well.   To learn more about what you can do with MyChart, go to [email or web address].    Your next appointment:   6 month(s)  The format for your next appointment:   In Person  Provider:   Christen Bame, Richardson Dopp, or [employer]. Then, Sherren Mocha, MD  will plan to see you in 1 year     Important Information About Sugar

## 2022-02-14 DIAGNOSIS — N2581 Secondary hyperparathyroidism of renal origin: Secondary | ICD-10-CM | POA: Diagnosis not present

## 2022-02-14 DIAGNOSIS — N186 End stage renal disease: Secondary | ICD-10-CM | POA: Diagnosis not present

## 2022-02-14 DIAGNOSIS — Z992 Dependence on renal dialysis: Secondary | ICD-10-CM | POA: Diagnosis not present

## 2022-02-17 DIAGNOSIS — N2581 Secondary hyperparathyroidism of renal origin: Secondary | ICD-10-CM | POA: Diagnosis not present

## 2022-02-17 DIAGNOSIS — N186 End stage renal disease: Secondary | ICD-10-CM | POA: Diagnosis not present

## 2022-02-17 DIAGNOSIS — Z992 Dependence on renal dialysis: Secondary | ICD-10-CM | POA: Diagnosis not present

## 2022-02-19 DIAGNOSIS — Z992 Dependence on renal dialysis: Secondary | ICD-10-CM | POA: Diagnosis not present

## 2022-02-19 DIAGNOSIS — N2581 Secondary hyperparathyroidism of renal origin: Secondary | ICD-10-CM | POA: Diagnosis not present

## 2022-02-19 DIAGNOSIS — N019 Rapidly progressive nephritic syndrome with unspecified morphologic changes: Secondary | ICD-10-CM | POA: Diagnosis not present

## 2022-02-19 DIAGNOSIS — N186 End stage renal disease: Secondary | ICD-10-CM | POA: Diagnosis not present

## 2022-02-21 DIAGNOSIS — Z992 Dependence on renal dialysis: Secondary | ICD-10-CM | POA: Diagnosis not present

## 2022-02-21 DIAGNOSIS — N186 End stage renal disease: Secondary | ICD-10-CM | POA: Diagnosis not present

## 2022-02-21 DIAGNOSIS — N2581 Secondary hyperparathyroidism of renal origin: Secondary | ICD-10-CM | POA: Diagnosis not present

## 2022-02-24 DIAGNOSIS — N2581 Secondary hyperparathyroidism of renal origin: Secondary | ICD-10-CM | POA: Diagnosis not present

## 2022-02-24 DIAGNOSIS — N186 End stage renal disease: Secondary | ICD-10-CM | POA: Diagnosis not present

## 2022-02-24 DIAGNOSIS — Z992 Dependence on renal dialysis: Secondary | ICD-10-CM | POA: Diagnosis not present

## 2022-02-26 DIAGNOSIS — N2581 Secondary hyperparathyroidism of renal origin: Secondary | ICD-10-CM | POA: Diagnosis not present

## 2022-02-26 DIAGNOSIS — Z992 Dependence on renal dialysis: Secondary | ICD-10-CM | POA: Diagnosis not present

## 2022-02-26 DIAGNOSIS — N186 End stage renal disease: Secondary | ICD-10-CM | POA: Diagnosis not present

## 2022-02-28 ENCOUNTER — Ambulatory Visit: Payer: Medicare PPO | Admitting: Pulmonary Disease

## 2022-02-28 ENCOUNTER — Encounter: Payer: Self-pay | Admitting: Pulmonary Disease

## 2022-02-28 VITALS — BP 124/64 | HR 80 | Temp 98.3°F | Ht 66.0 in | Wt 125.2 lb

## 2022-02-28 DIAGNOSIS — Z992 Dependence on renal dialysis: Secondary | ICD-10-CM | POA: Diagnosis not present

## 2022-02-28 DIAGNOSIS — I7782 Antineutrophilic cytoplasmic antibody (ANCA) vasculitis: Secondary | ICD-10-CM | POA: Diagnosis not present

## 2022-02-28 DIAGNOSIS — N2581 Secondary hyperparathyroidism of renal origin: Secondary | ICD-10-CM | POA: Diagnosis not present

## 2022-02-28 DIAGNOSIS — N186 End stage renal disease: Secondary | ICD-10-CM | POA: Diagnosis not present

## 2022-02-28 NOTE — Patient Instructions (Signed)
Follow up in 7 to 8 months 

## 2022-02-28 NOTE — Progress Notes (Signed)
[facility]  Chief Complaint  Patient presents with   Follow-up    Had pneumonia and stopped her from getting kidney transplant,  Is currently taking 40 mg prednisone until she returns to pulm. Doctor in [place]    Constitutional:  BP 124/64 (BP Location: Right Arm, Patient Position: Sitting)   Pulse 80   Temp 98.3 F (36.8 C) (Temporal)   Ht '5\' 6"'$  (1.676 m)   Wt 125 lb 3.2 oz (56.8 kg)   SpO2 98% Comment: ra  BMI 20.21 kg/m   Past Medical History:  Chronic back pain, HTN, Hypothyroidism, CKD, HLD, COVID 11 August 2019  Past Surgical History:  Her  has a past surgical history that includes Back surgery; bil knee replacements; basal cell skin cancer; Lumbar laminectomy/decompression microdiscectomy (08/13/2012); Abdominal hysterectomy; Colonoscopy; IR US Guide Vasc Access Right (03/20/2020); IR Fluoro Guide CV Line Right (03/20/2020); AV fistula placement (Left, 04/02/2020); A/V Fistulagram (N/A, 05/17/2020); PERIPHERAL VASCULAR BALLOON ANGIOPLASTY (Left, 05/17/2020); Breast biopsy; and RIGHT/LEFT HEART CATH AND CORONARY ANGIOGRAPHY (N/A, 01/17/2021).  Brief Summary:  Jasmine White is a 76 y.o. 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      Subjective:   She was seen by Dr. Adele Barthel with pulmonary at [facility] in [place].  She started having a cough in December.  She had chest xray and then CT chest that showed multifocal consolidation.  She had bronchoscopy in March 2023.  Found to have Stenotrophomonas and MSSA.  She was treated with bactrim.  She is being assessed for renal transplant.  She had follow up scan in May 2023 which showed persistent infiltrates.  She was started on levaquin and prednisone.  Since then her cough has resolved.  She otherwise seems to be doing well.  She exercises for 30 minutes to an hour every day, and  keeps up with all her household activities and yard work without difficulty.   Physical Exam:   Appearance - well kempt   ENMT - no sinus tenderness, no oral exudate, no LAN, Mallampati 2 airway, no stridor  Respiratory - equal breath sounds bilaterally, no wheezing or rales  CV - s1s2 regular rate and rhythm, 2/6 SM  Ext - no clubbing, no edema  Skin - no rashes  Psych - normal mood and affect      Serology:  ANCA 03/18/20 >> MPO 10.3 (nl 0 - 9) GBM Ab 03/18/20 >> 137 (nl 0 - 20) ANA 6/27 >> positive, ds DNA Ab 50 (nl < 5) GMB Ab 04/03/20 >> 55  Pulmonary testing:  Rt thoracentesis 04/05/20 >> 900 cc straw colored fluid, glucose 133, protein < 3, LDH 50, WBC 63 (72%M) Bronchoscopy 04/05/20 >> WBC 125 (68%N) PFT 07/24/20 >> FEV1 1.71 (74%), FEV1% 84, TLC 3.92 (73%), DLCO 59%  Chest Imaging:  V/Q scan 03/22/20 >> negative CT chest 03/24/20 >> mosaic areas of GGO and consolidation, mod b/l effusions CT chest 04/05/20 >> large b/l effusions with compressive ATX, extensive ASD HRCT 04/16/20 >> mod Rt and small Lt effusions, extensive/dense consolidation HRCT chest 08/07/20 >> resolution of previous ASD and b/l effusions; mild irregular peripheral interstitial opacity throughout the lungs with a slight apical to basal gradient HRCT chest 12/17/20 >> Rt greater than Lt pleural effusion, air trapping, atelectasis CT chest 11/08/21 >> multifocal consolidation  Cardiac Tests:  Echo 11/05/21 >> EF 58%, mild AS, mild/mod AR, mild MR  Social History:  She  reports that she has never smoked. She has never used smokeless tobacco. She reports that she does not drink alcohol and does not use drugs.  Family History:  Her family history includes Alcohol abuse in her father; Arthritis in her brother and brother; Cancer (age of onset: 6) in her brother; Heart disease in her father and mother; Hypertension in her mother; Stroke in her father.     Assessment/Plan:   Interstitial lung disease  with Diffuse Alveolar Hemorrhage in July 2021. - in setting of ANCA, GBM positive rapidly progressive glomerulonephritis - she had recent courses of antibiotics from pneumonia, and clinically improved - she will follow up with Dr. Haskel Schroeder in [place] to have repeat imaging studies and then determine plan for prednisone taper  Chronic systolic CHF with mitral valve regurgitation. - followed by Dr. Sherren Mocha with [facility]  ESRD in setting of Rapidly Progressive Glomerulonephritis. - followed by Dr. Marval Regal with [facility] - treated with rituximab in Summer of 2021 - she is followed by transplant team at [facility] in [place], and will be getting donor organ from her daughter - she is hopeful she will be able to get the transplant done in Fall of 2023  Time Spent Involved in Patient Care on Day of Examination:  27 minutes  Follow up:   Patient Instructions  Follow up in 7 to 8 months  Medication List:   Allergies as of 02/28/2022       Reactions   Hydrochlorothiazide Other (See Comments)   Hx of severe Hyponatremia while taking HCTZ - now contraindicated Other reaction(s): low sodium, Other (See Comments) Hx of severe Hyponatremia while taking HCTZ - now contraindicated Hx of severe Hyponatremia while taking HCTZ - now contraindicated   Other Other (See Comments)   If ever given "strong" pain medication, the patient will require something to counteract the nausea   Percocet [oxycodone-acetaminophen] Nausea Only   Tramadol Nausea Only, Other (See Comments)   Patient remarked this made her "feel crazy" (also) Other reaction(s): nausea, Nausea And Vomiting, Nausea Only Patient remarked this made her "feel crazy" (also)        Medication List        Accurate as of February 28, 2022 10:54 AM. If you have any questions, ask your nurse or doctor.          STOP taking these medications    albuterol 108 (90 Base) MCG/ACT inhaler Commonly known as: VENTOLIN  HFA Stopped by: Chesley Mires, MD   MIRCERA IJ Stopped by: Chesley Mires, MD       TAKE these medications    acetaminophen 500 MG tablet Commonly known as: TYLENOL Take 500-1,000 mg by mouth daily as needed (for pain).   b complex-vitamin c-folic acid 0.8 MG Tabs tablet Take 1 tablet by mouth at bedtime.   carvedilol 3.125 MG tablet Commonly known as: COREG Take 1 tablet (3.125 mg total) by mouth 2 (two) times daily with a meal. Pt will hold on Dialysis days   escitalopram 20 MG tablet Commonly known as: LEXAPRO Take 20 mg by mouth daily.   HYDROcodone-acetaminophen 5-325 MG tablet Commonly known as: NORCO/VICODIN Take 0.5 tablets by mouth at bedtime as needed (for pain).   loratadine 10 MG tablet Commonly known as: CLARITIN Take 10 mg by mouth daily.   predniSONE 20 MG tablet Commonly known as: DELTASONE Take 20 mg by mouth daily with breakfast. Taking 40 mg until March 18, 2022  sevelamer carbonate 800 MG tablet Commonly known as: RENVELA Take 800 mg by mouth 3 (three) times daily with meals. Patient take 4-800 mg by mouth at each meal   Synthroid 88 MCG tablet Generic drug: levothyroxine TAKE 1 TABLET BY MOUTH ONCE DAILY BEFORE BREAKFAST   valACYclovir 500 MG tablet Commonly known as: VALTREX TAKE 1 TABLET BY MOUTH AS DIRECTED 1 TABLET BY MOUTH ON DIALYSIS DAYS ONLY   zolpidem 5 MG tablet Commonly known as: AMBIEN Take 1 tablet by mouth at bedtime.        Signature:  Chesley Mires, MD Trainer Pager - [phone] 02/28/2022, 10:54 AM

## 2022-03-03 DIAGNOSIS — N2581 Secondary hyperparathyroidism of renal origin: Secondary | ICD-10-CM | POA: Diagnosis not present

## 2022-03-03 DIAGNOSIS — N186 End stage renal disease: Secondary | ICD-10-CM | POA: Diagnosis not present

## 2022-03-03 DIAGNOSIS — Z992 Dependence on renal dialysis: Secondary | ICD-10-CM | POA: Diagnosis not present

## 2022-03-05 DIAGNOSIS — N2581 Secondary hyperparathyroidism of renal origin: Secondary | ICD-10-CM | POA: Diagnosis not present

## 2022-03-05 DIAGNOSIS — Z992 Dependence on renal dialysis: Secondary | ICD-10-CM | POA: Diagnosis not present

## 2022-03-05 DIAGNOSIS — N186 End stage renal disease: Secondary | ICD-10-CM | POA: Diagnosis not present

## 2022-03-07 DIAGNOSIS — N186 End stage renal disease: Secondary | ICD-10-CM | POA: Diagnosis not present

## 2022-03-07 DIAGNOSIS — Z992 Dependence on renal dialysis: Secondary | ICD-10-CM | POA: Diagnosis not present

## 2022-03-07 DIAGNOSIS — N2581 Secondary hyperparathyroidism of renal origin: Secondary | ICD-10-CM | POA: Diagnosis not present

## 2022-03-10 DIAGNOSIS — N186 End stage renal disease: Secondary | ICD-10-CM | POA: Diagnosis not present

## 2022-03-10 DIAGNOSIS — N2581 Secondary hyperparathyroidism of renal origin: Secondary | ICD-10-CM | POA: Diagnosis not present

## 2022-03-10 DIAGNOSIS — Z992 Dependence on renal dialysis: Secondary | ICD-10-CM | POA: Diagnosis not present

## 2022-03-12 DIAGNOSIS — N2581 Secondary hyperparathyroidism of renal origin: Secondary | ICD-10-CM | POA: Diagnosis not present

## 2022-03-12 DIAGNOSIS — Z992 Dependence on renal dialysis: Secondary | ICD-10-CM | POA: Diagnosis not present

## 2022-03-12 DIAGNOSIS — N186 End stage renal disease: Secondary | ICD-10-CM | POA: Diagnosis not present

## 2022-03-14 DIAGNOSIS — N2581 Secondary hyperparathyroidism of renal origin: Secondary | ICD-10-CM | POA: Diagnosis not present

## 2022-03-14 DIAGNOSIS — N186 End stage renal disease: Secondary | ICD-10-CM | POA: Diagnosis not present

## 2022-03-14 DIAGNOSIS — Z992 Dependence on renal dialysis: Secondary | ICD-10-CM | POA: Diagnosis not present

## 2022-03-17 DIAGNOSIS — Z992 Dependence on renal dialysis: Secondary | ICD-10-CM | POA: Diagnosis not present

## 2022-03-17 DIAGNOSIS — N2581 Secondary hyperparathyroidism of renal origin: Secondary | ICD-10-CM | POA: Diagnosis not present

## 2022-03-17 DIAGNOSIS — N186 End stage renal disease: Secondary | ICD-10-CM | POA: Diagnosis not present

## 2022-03-18 DIAGNOSIS — J849 Interstitial pulmonary disease, unspecified: Secondary | ICD-10-CM | POA: Diagnosis not present

## 2022-03-18 DIAGNOSIS — J8489 Other specified interstitial pulmonary diseases: Secondary | ICD-10-CM | POA: Diagnosis not present

## 2022-03-18 DIAGNOSIS — J189 Pneumonia, unspecified organism: Secondary | ICD-10-CM | POA: Diagnosis not present

## 2022-03-19 DIAGNOSIS — Z992 Dependence on renal dialysis: Secondary | ICD-10-CM | POA: Diagnosis not present

## 2022-03-19 DIAGNOSIS — N186 End stage renal disease: Secondary | ICD-10-CM | POA: Diagnosis not present

## 2022-03-19 DIAGNOSIS — N2581 Secondary hyperparathyroidism of renal origin: Secondary | ICD-10-CM | POA: Diagnosis not present

## 2022-03-21 DIAGNOSIS — N019 Rapidly progressive nephritic syndrome with unspecified morphologic changes: Secondary | ICD-10-CM | POA: Diagnosis not present

## 2022-03-21 DIAGNOSIS — Z992 Dependence on renal dialysis: Secondary | ICD-10-CM | POA: Diagnosis not present

## 2022-03-21 DIAGNOSIS — N2581 Secondary hyperparathyroidism of renal origin: Secondary | ICD-10-CM | POA: Diagnosis not present

## 2022-03-21 DIAGNOSIS — N186 End stage renal disease: Secondary | ICD-10-CM | POA: Diagnosis not present

## 2022-03-24 DIAGNOSIS — Z992 Dependence on renal dialysis: Secondary | ICD-10-CM | POA: Diagnosis not present

## 2022-03-24 DIAGNOSIS — N186 End stage renal disease: Secondary | ICD-10-CM | POA: Diagnosis not present

## 2022-03-24 DIAGNOSIS — N2581 Secondary hyperparathyroidism of renal origin: Secondary | ICD-10-CM | POA: Diagnosis not present

## 2022-03-26 DIAGNOSIS — Z992 Dependence on renal dialysis: Secondary | ICD-10-CM | POA: Diagnosis not present

## 2022-03-26 DIAGNOSIS — N2581 Secondary hyperparathyroidism of renal origin: Secondary | ICD-10-CM | POA: Diagnosis not present

## 2022-03-26 DIAGNOSIS — N186 End stage renal disease: Secondary | ICD-10-CM | POA: Diagnosis not present

## 2022-03-28 DIAGNOSIS — N186 End stage renal disease: Secondary | ICD-10-CM | POA: Diagnosis not present

## 2022-03-28 DIAGNOSIS — N2581 Secondary hyperparathyroidism of renal origin: Secondary | ICD-10-CM | POA: Diagnosis not present

## 2022-03-28 DIAGNOSIS — Z992 Dependence on renal dialysis: Secondary | ICD-10-CM | POA: Diagnosis not present

## 2022-03-31 DIAGNOSIS — Z992 Dependence on renal dialysis: Secondary | ICD-10-CM | POA: Diagnosis not present

## 2022-03-31 DIAGNOSIS — N2581 Secondary hyperparathyroidism of renal origin: Secondary | ICD-10-CM | POA: Diagnosis not present

## 2022-03-31 DIAGNOSIS — N186 End stage renal disease: Secondary | ICD-10-CM | POA: Diagnosis not present

## 2022-04-02 DIAGNOSIS — Z992 Dependence on renal dialysis: Secondary | ICD-10-CM | POA: Diagnosis not present

## 2022-04-02 DIAGNOSIS — N2581 Secondary hyperparathyroidism of renal origin: Secondary | ICD-10-CM | POA: Diagnosis not present

## 2022-04-02 DIAGNOSIS — N186 End stage renal disease: Secondary | ICD-10-CM | POA: Diagnosis not present

## 2022-04-04 DIAGNOSIS — Z992 Dependence on renal dialysis: Secondary | ICD-10-CM | POA: Diagnosis not present

## 2022-04-04 DIAGNOSIS — N186 End stage renal disease: Secondary | ICD-10-CM | POA: Diagnosis not present

## 2022-04-04 DIAGNOSIS — N2581 Secondary hyperparathyroidism of renal origin: Secondary | ICD-10-CM | POA: Diagnosis not present

## 2022-04-07 DIAGNOSIS — N2581 Secondary hyperparathyroidism of renal origin: Secondary | ICD-10-CM | POA: Diagnosis not present

## 2022-04-07 DIAGNOSIS — Z992 Dependence on renal dialysis: Secondary | ICD-10-CM | POA: Diagnosis not present

## 2022-04-07 DIAGNOSIS — N186 End stage renal disease: Secondary | ICD-10-CM | POA: Diagnosis not present

## 2022-04-09 DIAGNOSIS — Z992 Dependence on renal dialysis: Secondary | ICD-10-CM | POA: Diagnosis not present

## 2022-04-09 DIAGNOSIS — N186 End stage renal disease: Secondary | ICD-10-CM | POA: Diagnosis not present

## 2022-04-09 DIAGNOSIS — N2581 Secondary hyperparathyroidism of renal origin: Secondary | ICD-10-CM | POA: Diagnosis not present

## 2022-04-11 DIAGNOSIS — N2581 Secondary hyperparathyroidism of renal origin: Secondary | ICD-10-CM | POA: Diagnosis not present

## 2022-04-11 DIAGNOSIS — Z992 Dependence on renal dialysis: Secondary | ICD-10-CM | POA: Diagnosis not present

## 2022-04-11 DIAGNOSIS — N186 End stage renal disease: Secondary | ICD-10-CM | POA: Diagnosis not present

## 2022-04-14 DIAGNOSIS — N2581 Secondary hyperparathyroidism of renal origin: Secondary | ICD-10-CM | POA: Diagnosis not present

## 2022-04-14 DIAGNOSIS — N186 End stage renal disease: Secondary | ICD-10-CM | POA: Diagnosis not present

## 2022-04-14 DIAGNOSIS — Z992 Dependence on renal dialysis: Secondary | ICD-10-CM | POA: Diagnosis not present

## 2022-04-15 DIAGNOSIS — D485 Neoplasm of uncertain behavior of skin: Secondary | ICD-10-CM | POA: Diagnosis not present

## 2022-04-15 DIAGNOSIS — H61002 Unspecified perichondritis of left external ear: Secondary | ICD-10-CM | POA: Diagnosis not present

## 2022-04-15 DIAGNOSIS — L570 Actinic keratosis: Secondary | ICD-10-CM | POA: Diagnosis not present

## 2022-04-15 DIAGNOSIS — C44519 Basal cell carcinoma of skin of other part of trunk: Secondary | ICD-10-CM | POA: Diagnosis not present

## 2022-04-15 DIAGNOSIS — Z85828 Personal history of other malignant neoplasm of skin: Secondary | ICD-10-CM | POA: Diagnosis not present

## 2022-04-16 DIAGNOSIS — N186 End stage renal disease: Secondary | ICD-10-CM | POA: Diagnosis not present

## 2022-04-16 DIAGNOSIS — Z992 Dependence on renal dialysis: Secondary | ICD-10-CM | POA: Diagnosis not present

## 2022-04-16 DIAGNOSIS — N2581 Secondary hyperparathyroidism of renal origin: Secondary | ICD-10-CM | POA: Diagnosis not present

## 2022-04-17 DIAGNOSIS — D631 Anemia in chronic kidney disease: Secondary | ICD-10-CM | POA: Diagnosis not present

## 2022-04-17 DIAGNOSIS — Z992 Dependence on renal dialysis: Secondary | ICD-10-CM | POA: Diagnosis not present

## 2022-04-17 DIAGNOSIS — N186 End stage renal disease: Secondary | ICD-10-CM | POA: Diagnosis not present

## 2022-04-17 DIAGNOSIS — I776 Arteritis, unspecified: Secondary | ICD-10-CM | POA: Diagnosis not present

## 2022-04-17 DIAGNOSIS — F3341 Major depressive disorder, recurrent, in partial remission: Secondary | ICD-10-CM | POA: Diagnosis not present

## 2022-04-17 DIAGNOSIS — Z0000 Encounter for general adult medical examination without abnormal findings: Secondary | ICD-10-CM | POA: Diagnosis not present

## 2022-04-17 DIAGNOSIS — E039 Hypothyroidism, unspecified: Secondary | ICD-10-CM | POA: Diagnosis not present

## 2022-04-17 DIAGNOSIS — I10 Essential (primary) hypertension: Secondary | ICD-10-CM | POA: Diagnosis not present

## 2022-04-17 DIAGNOSIS — N2581 Secondary hyperparathyroidism of renal origin: Secondary | ICD-10-CM | POA: Diagnosis not present

## 2022-04-17 DIAGNOSIS — E785 Hyperlipidemia, unspecified: Secondary | ICD-10-CM | POA: Diagnosis not present

## 2022-04-18 DIAGNOSIS — N2581 Secondary hyperparathyroidism of renal origin: Secondary | ICD-10-CM | POA: Diagnosis not present

## 2022-04-18 DIAGNOSIS — N186 End stage renal disease: Secondary | ICD-10-CM | POA: Diagnosis not present

## 2022-04-18 DIAGNOSIS — Z992 Dependence on renal dialysis: Secondary | ICD-10-CM | POA: Diagnosis not present

## 2022-04-21 DIAGNOSIS — N2581 Secondary hyperparathyroidism of renal origin: Secondary | ICD-10-CM | POA: Diagnosis not present

## 2022-04-21 DIAGNOSIS — N019 Rapidly progressive nephritic syndrome with unspecified morphologic changes: Secondary | ICD-10-CM | POA: Diagnosis not present

## 2022-04-21 DIAGNOSIS — N186 End stage renal disease: Secondary | ICD-10-CM | POA: Diagnosis not present

## 2022-04-21 DIAGNOSIS — Z992 Dependence on renal dialysis: Secondary | ICD-10-CM | POA: Diagnosis not present

## 2022-04-23 DIAGNOSIS — N186 End stage renal disease: Secondary | ICD-10-CM | POA: Diagnosis not present

## 2022-04-23 DIAGNOSIS — Z992 Dependence on renal dialysis: Secondary | ICD-10-CM | POA: Diagnosis not present

## 2022-04-23 DIAGNOSIS — N2581 Secondary hyperparathyroidism of renal origin: Secondary | ICD-10-CM | POA: Diagnosis not present

## 2022-04-25 DIAGNOSIS — N2581 Secondary hyperparathyroidism of renal origin: Secondary | ICD-10-CM | POA: Diagnosis not present

## 2022-04-25 DIAGNOSIS — Z992 Dependence on renal dialysis: Secondary | ICD-10-CM | POA: Diagnosis not present

## 2022-04-25 DIAGNOSIS — N186 End stage renal disease: Secondary | ICD-10-CM | POA: Diagnosis not present

## 2022-04-28 DIAGNOSIS — N186 End stage renal disease: Secondary | ICD-10-CM | POA: Diagnosis not present

## 2022-04-28 DIAGNOSIS — Z992 Dependence on renal dialysis: Secondary | ICD-10-CM | POA: Diagnosis not present

## 2022-04-28 DIAGNOSIS — N2581 Secondary hyperparathyroidism of renal origin: Secondary | ICD-10-CM | POA: Diagnosis not present

## 2022-04-29 DIAGNOSIS — J849 Interstitial pulmonary disease, unspecified: Secondary | ICD-10-CM | POA: Diagnosis not present

## 2022-04-29 DIAGNOSIS — R918 Other nonspecific abnormal finding of lung field: Secondary | ICD-10-CM | POA: Diagnosis not present

## 2022-04-30 DIAGNOSIS — Z992 Dependence on renal dialysis: Secondary | ICD-10-CM | POA: Diagnosis not present

## 2022-04-30 DIAGNOSIS — N2581 Secondary hyperparathyroidism of renal origin: Secondary | ICD-10-CM | POA: Diagnosis not present

## 2022-04-30 DIAGNOSIS — N186 End stage renal disease: Secondary | ICD-10-CM | POA: Diagnosis not present

## 2022-05-02 DIAGNOSIS — N2581 Secondary hyperparathyroidism of renal origin: Secondary | ICD-10-CM | POA: Diagnosis not present

## 2022-05-02 DIAGNOSIS — Z992 Dependence on renal dialysis: Secondary | ICD-10-CM | POA: Diagnosis not present

## 2022-05-02 DIAGNOSIS — N186 End stage renal disease: Secondary | ICD-10-CM | POA: Diagnosis not present

## 2022-05-05 DIAGNOSIS — N186 End stage renal disease: Secondary | ICD-10-CM | POA: Diagnosis not present

## 2022-05-05 DIAGNOSIS — Z992 Dependence on renal dialysis: Secondary | ICD-10-CM | POA: Diagnosis not present

## 2022-05-05 DIAGNOSIS — N2581 Secondary hyperparathyroidism of renal origin: Secondary | ICD-10-CM | POA: Diagnosis not present

## 2022-05-07 DIAGNOSIS — N2581 Secondary hyperparathyroidism of renal origin: Secondary | ICD-10-CM | POA: Diagnosis not present

## 2022-05-07 DIAGNOSIS — Z992 Dependence on renal dialysis: Secondary | ICD-10-CM | POA: Diagnosis not present

## 2022-05-07 DIAGNOSIS — N186 End stage renal disease: Secondary | ICD-10-CM | POA: Diagnosis not present

## 2022-05-09 DIAGNOSIS — N2581 Secondary hyperparathyroidism of renal origin: Secondary | ICD-10-CM | POA: Diagnosis not present

## 2022-05-09 DIAGNOSIS — Z992 Dependence on renal dialysis: Secondary | ICD-10-CM | POA: Diagnosis not present

## 2022-05-09 DIAGNOSIS — N186 End stage renal disease: Secondary | ICD-10-CM | POA: Diagnosis not present

## 2022-05-12 DIAGNOSIS — N186 End stage renal disease: Secondary | ICD-10-CM | POA: Diagnosis not present

## 2022-05-12 DIAGNOSIS — Z992 Dependence on renal dialysis: Secondary | ICD-10-CM | POA: Diagnosis not present

## 2022-05-12 DIAGNOSIS — N2581 Secondary hyperparathyroidism of renal origin: Secondary | ICD-10-CM | POA: Diagnosis not present

## 2022-05-14 DIAGNOSIS — N186 End stage renal disease: Secondary | ICD-10-CM | POA: Diagnosis not present

## 2022-05-14 DIAGNOSIS — Z992 Dependence on renal dialysis: Secondary | ICD-10-CM | POA: Diagnosis not present

## 2022-05-14 DIAGNOSIS — N2581 Secondary hyperparathyroidism of renal origin: Secondary | ICD-10-CM | POA: Diagnosis not present

## 2022-05-16 DIAGNOSIS — N2581 Secondary hyperparathyroidism of renal origin: Secondary | ICD-10-CM | POA: Diagnosis not present

## 2022-05-16 DIAGNOSIS — N186 End stage renal disease: Secondary | ICD-10-CM | POA: Diagnosis not present

## 2022-05-16 DIAGNOSIS — Z992 Dependence on renal dialysis: Secondary | ICD-10-CM | POA: Diagnosis not present

## 2022-05-19 DIAGNOSIS — Z992 Dependence on renal dialysis: Secondary | ICD-10-CM | POA: Diagnosis not present

## 2022-05-19 DIAGNOSIS — N2581 Secondary hyperparathyroidism of renal origin: Secondary | ICD-10-CM | POA: Diagnosis not present

## 2022-05-19 DIAGNOSIS — N186 End stage renal disease: Secondary | ICD-10-CM | POA: Diagnosis not present

## 2022-05-21 DIAGNOSIS — Z992 Dependence on renal dialysis: Secondary | ICD-10-CM | POA: Diagnosis not present

## 2022-05-21 DIAGNOSIS — N2581 Secondary hyperparathyroidism of renal origin: Secondary | ICD-10-CM | POA: Diagnosis not present

## 2022-05-21 DIAGNOSIS — N186 End stage renal disease: Secondary | ICD-10-CM | POA: Diagnosis not present

## 2022-05-22 DIAGNOSIS — N019 Rapidly progressive nephritic syndrome with unspecified morphologic changes: Secondary | ICD-10-CM | POA: Diagnosis not present

## 2022-05-22 DIAGNOSIS — N186 End stage renal disease: Secondary | ICD-10-CM | POA: Diagnosis not present

## 2022-05-22 DIAGNOSIS — Z992 Dependence on renal dialysis: Secondary | ICD-10-CM | POA: Diagnosis not present

## 2022-05-23 DIAGNOSIS — N186 End stage renal disease: Secondary | ICD-10-CM | POA: Diagnosis not present

## 2022-05-23 DIAGNOSIS — Z992 Dependence on renal dialysis: Secondary | ICD-10-CM | POA: Diagnosis not present

## 2022-05-23 DIAGNOSIS — N2581 Secondary hyperparathyroidism of renal origin: Secondary | ICD-10-CM | POA: Diagnosis not present

## 2022-05-26 DIAGNOSIS — N186 End stage renal disease: Secondary | ICD-10-CM | POA: Diagnosis not present

## 2022-05-26 DIAGNOSIS — N2581 Secondary hyperparathyroidism of renal origin: Secondary | ICD-10-CM | POA: Diagnosis not present

## 2022-05-26 DIAGNOSIS — Z992 Dependence on renal dialysis: Secondary | ICD-10-CM | POA: Diagnosis not present

## 2022-05-28 DIAGNOSIS — N186 End stage renal disease: Secondary | ICD-10-CM | POA: Diagnosis not present

## 2022-05-28 DIAGNOSIS — Z992 Dependence on renal dialysis: Secondary | ICD-10-CM | POA: Diagnosis not present

## 2022-05-28 DIAGNOSIS — N2581 Secondary hyperparathyroidism of renal origin: Secondary | ICD-10-CM | POA: Diagnosis not present

## 2022-05-29 DIAGNOSIS — N186 End stage renal disease: Secondary | ICD-10-CM | POA: Diagnosis not present

## 2022-05-29 DIAGNOSIS — Z992 Dependence on renal dialysis: Secondary | ICD-10-CM | POA: Diagnosis not present

## 2022-05-29 DIAGNOSIS — I871 Compression of vein: Secondary | ICD-10-CM | POA: Diagnosis not present

## 2022-05-29 DIAGNOSIS — T82858A Stenosis of vascular prosthetic devices, implants and grafts, initial encounter: Secondary | ICD-10-CM | POA: Diagnosis not present

## 2022-05-30 DIAGNOSIS — N186 End stage renal disease: Secondary | ICD-10-CM | POA: Diagnosis not present

## 2022-05-30 DIAGNOSIS — N2581 Secondary hyperparathyroidism of renal origin: Secondary | ICD-10-CM | POA: Diagnosis not present

## 2022-05-30 DIAGNOSIS — Z992 Dependence on renal dialysis: Secondary | ICD-10-CM | POA: Diagnosis not present

## 2022-06-02 DIAGNOSIS — N186 End stage renal disease: Secondary | ICD-10-CM | POA: Diagnosis not present

## 2022-06-02 DIAGNOSIS — Z992 Dependence on renal dialysis: Secondary | ICD-10-CM | POA: Diagnosis not present

## 2022-06-02 DIAGNOSIS — N2581 Secondary hyperparathyroidism of renal origin: Secondary | ICD-10-CM | POA: Diagnosis not present

## 2022-06-04 DIAGNOSIS — N186 End stage renal disease: Secondary | ICD-10-CM | POA: Diagnosis not present

## 2022-06-04 DIAGNOSIS — Z992 Dependence on renal dialysis: Secondary | ICD-10-CM | POA: Diagnosis not present

## 2022-06-04 DIAGNOSIS — N2581 Secondary hyperparathyroidism of renal origin: Secondary | ICD-10-CM | POA: Diagnosis not present

## 2022-06-05 ENCOUNTER — Other Ambulatory Visit: Payer: Self-pay | Admitting: Cardiovascular Disease

## 2022-06-06 DIAGNOSIS — Z992 Dependence on renal dialysis: Secondary | ICD-10-CM | POA: Diagnosis not present

## 2022-06-06 DIAGNOSIS — N2581 Secondary hyperparathyroidism of renal origin: Secondary | ICD-10-CM | POA: Diagnosis not present

## 2022-06-06 DIAGNOSIS — N186 End stage renal disease: Secondary | ICD-10-CM | POA: Diagnosis not present

## 2022-06-09 DIAGNOSIS — N2581 Secondary hyperparathyroidism of renal origin: Secondary | ICD-10-CM | POA: Diagnosis not present

## 2022-06-09 DIAGNOSIS — N186 End stage renal disease: Secondary | ICD-10-CM | POA: Diagnosis not present

## 2022-06-09 DIAGNOSIS — Z992 Dependence on renal dialysis: Secondary | ICD-10-CM | POA: Diagnosis not present

## 2022-06-11 DIAGNOSIS — Z992 Dependence on renal dialysis: Secondary | ICD-10-CM | POA: Diagnosis not present

## 2022-06-11 DIAGNOSIS — N186 End stage renal disease: Secondary | ICD-10-CM | POA: Diagnosis not present

## 2022-06-11 DIAGNOSIS — N2581 Secondary hyperparathyroidism of renal origin: Secondary | ICD-10-CM | POA: Diagnosis not present

## 2022-06-13 DIAGNOSIS — N186 End stage renal disease: Secondary | ICD-10-CM | POA: Diagnosis not present

## 2022-06-13 DIAGNOSIS — N2581 Secondary hyperparathyroidism of renal origin: Secondary | ICD-10-CM | POA: Diagnosis not present

## 2022-06-13 DIAGNOSIS — Z992 Dependence on renal dialysis: Secondary | ICD-10-CM | POA: Diagnosis not present

## 2022-06-16 DIAGNOSIS — N2581 Secondary hyperparathyroidism of renal origin: Secondary | ICD-10-CM | POA: Diagnosis not present

## 2022-06-16 DIAGNOSIS — Z992 Dependence on renal dialysis: Secondary | ICD-10-CM | POA: Diagnosis not present

## 2022-06-16 DIAGNOSIS — N186 End stage renal disease: Secondary | ICD-10-CM | POA: Diagnosis not present

## 2022-06-18 DIAGNOSIS — Z992 Dependence on renal dialysis: Secondary | ICD-10-CM | POA: Diagnosis not present

## 2022-06-18 DIAGNOSIS — N186 End stage renal disease: Secondary | ICD-10-CM | POA: Diagnosis not present

## 2022-06-18 DIAGNOSIS — N2581 Secondary hyperparathyroidism of renal origin: Secondary | ICD-10-CM | POA: Diagnosis not present

## 2022-06-19 DIAGNOSIS — I7782 Antineutrophilic cytoplasmic antibody (ANCA) vasculitis: Secondary | ICD-10-CM | POA: Diagnosis not present

## 2022-06-19 DIAGNOSIS — N186 End stage renal disease: Secondary | ICD-10-CM | POA: Diagnosis not present

## 2022-06-19 DIAGNOSIS — Z01811 Encounter for preprocedural respiratory examination: Secondary | ICD-10-CM | POA: Diagnosis not present

## 2022-06-19 DIAGNOSIS — Z992 Dependence on renal dialysis: Secondary | ICD-10-CM | POA: Diagnosis not present

## 2022-06-19 DIAGNOSIS — I428 Other cardiomyopathies: Secondary | ICD-10-CM | POA: Diagnosis not present

## 2022-06-19 DIAGNOSIS — J849 Interstitial pulmonary disease, unspecified: Secondary | ICD-10-CM | POA: Diagnosis not present

## 2022-06-20 DIAGNOSIS — N2581 Secondary hyperparathyroidism of renal origin: Secondary | ICD-10-CM | POA: Diagnosis not present

## 2022-06-20 DIAGNOSIS — N186 End stage renal disease: Secondary | ICD-10-CM | POA: Diagnosis not present

## 2022-06-20 DIAGNOSIS — Z992 Dependence on renal dialysis: Secondary | ICD-10-CM | POA: Diagnosis not present

## 2022-06-21 DIAGNOSIS — Z992 Dependence on renal dialysis: Secondary | ICD-10-CM | POA: Diagnosis not present

## 2022-06-21 DIAGNOSIS — N186 End stage renal disease: Secondary | ICD-10-CM | POA: Diagnosis not present

## 2022-06-21 DIAGNOSIS — N019 Rapidly progressive nephritic syndrome with unspecified morphologic changes: Secondary | ICD-10-CM | POA: Diagnosis not present

## 2022-06-23 DIAGNOSIS — N2581 Secondary hyperparathyroidism of renal origin: Secondary | ICD-10-CM | POA: Diagnosis not present

## 2022-06-23 DIAGNOSIS — N186 End stage renal disease: Secondary | ICD-10-CM | POA: Diagnosis not present

## 2022-06-23 DIAGNOSIS — Z992 Dependence on renal dialysis: Secondary | ICD-10-CM | POA: Diagnosis not present

## 2022-06-25 DIAGNOSIS — N2581 Secondary hyperparathyroidism of renal origin: Secondary | ICD-10-CM | POA: Diagnosis not present

## 2022-06-25 DIAGNOSIS — N186 End stage renal disease: Secondary | ICD-10-CM | POA: Diagnosis not present

## 2022-06-25 DIAGNOSIS — Z992 Dependence on renal dialysis: Secondary | ICD-10-CM | POA: Diagnosis not present

## 2022-06-27 DIAGNOSIS — Z992 Dependence on renal dialysis: Secondary | ICD-10-CM | POA: Diagnosis not present

## 2022-06-27 DIAGNOSIS — N2581 Secondary hyperparathyroidism of renal origin: Secondary | ICD-10-CM | POA: Diagnosis not present

## 2022-06-27 DIAGNOSIS — N186 End stage renal disease: Secondary | ICD-10-CM | POA: Diagnosis not present

## 2022-06-30 DIAGNOSIS — N186 End stage renal disease: Secondary | ICD-10-CM | POA: Diagnosis not present

## 2022-06-30 DIAGNOSIS — Z992 Dependence on renal dialysis: Secondary | ICD-10-CM | POA: Diagnosis not present

## 2022-06-30 DIAGNOSIS — N2581 Secondary hyperparathyroidism of renal origin: Secondary | ICD-10-CM | POA: Diagnosis not present

## 2022-07-02 DIAGNOSIS — N2581 Secondary hyperparathyroidism of renal origin: Secondary | ICD-10-CM | POA: Diagnosis not present

## 2022-07-02 DIAGNOSIS — N186 End stage renal disease: Secondary | ICD-10-CM | POA: Diagnosis not present

## 2022-07-02 DIAGNOSIS — Z992 Dependence on renal dialysis: Secondary | ICD-10-CM | POA: Diagnosis not present

## 2022-07-03 ENCOUNTER — Telehealth: Payer: Self-pay

## 2022-07-03 NOTE — Patient Outreach (Signed)
  Care Coordination   Initial Visit Note   07/03/2022 Name: Jasmine White MRN: [id number] DOB: 1945/11/01  Jasmine White is a 76 y.o. year old female who sees Leeroy Cha, MD for primary care. I spoke with  Jasmine White by phone today.  What matters to the patients health and wellness today?  Spoke with both patient and spouse. They report things going well. Patient is in the  process of getting living donor kidney transplant. They are hopeful that surgery will take place in about a month. Patient continues to undergo all the neccessary steps to complete prior to surgery. She is being followed closely by transplant. Patient will get transplant CM services as well. Denies  any RN CM needs or concerns at present.     Goals Addressed             This Visit's Progress    Care Coordination-no follow up required       Care Coordination Interventions: Advised patient to provide appropriate vaccination information to provider or CM team member at next visit Provided education to patient re: [facility] services Assessed social determinant of health barriers Updated immunizations record          SDOH assessments and interventions completed:  Yes  SDOH Interventions Today    Flowsheet Row Most Recent Value  SDOH Interventions   Food Insecurity Interventions Intervention Not Indicated  Transportation Interventions Intervention Not Indicated        Care Coordination Interventions Activated:  Yes  Care Coordination Interventions:  Yes, provided   Follow up plan: No further intervention required.   Encounter Outcome:  Pt. Visit Completed   Enzo Montgomery, RN,BSN,CCM [facility] Telephonic Care Management Coordinator Direct Phone: [phone] Toll Free: [phone] Fax: [phone]

## 2022-07-04 DIAGNOSIS — Z992 Dependence on renal dialysis: Secondary | ICD-10-CM | POA: Diagnosis not present

## 2022-07-04 DIAGNOSIS — N186 End stage renal disease: Secondary | ICD-10-CM | POA: Diagnosis not present

## 2022-07-04 DIAGNOSIS — N2581 Secondary hyperparathyroidism of renal origin: Secondary | ICD-10-CM | POA: Diagnosis not present

## 2022-07-07 DIAGNOSIS — N2581 Secondary hyperparathyroidism of renal origin: Secondary | ICD-10-CM | POA: Diagnosis not present

## 2022-07-07 DIAGNOSIS — Z992 Dependence on renal dialysis: Secondary | ICD-10-CM | POA: Diagnosis not present

## 2022-07-07 DIAGNOSIS — N186 End stage renal disease: Secondary | ICD-10-CM | POA: Diagnosis not present

## 2022-07-09 DIAGNOSIS — N186 End stage renal disease: Secondary | ICD-10-CM | POA: Diagnosis not present

## 2022-07-09 DIAGNOSIS — N2581 Secondary hyperparathyroidism of renal origin: Secondary | ICD-10-CM | POA: Diagnosis not present

## 2022-07-09 DIAGNOSIS — Z992 Dependence on renal dialysis: Secondary | ICD-10-CM | POA: Diagnosis not present

## 2022-07-11 DIAGNOSIS — Z992 Dependence on renal dialysis: Secondary | ICD-10-CM | POA: Diagnosis not present

## 2022-07-11 DIAGNOSIS — N186 End stage renal disease: Secondary | ICD-10-CM | POA: Diagnosis not present

## 2022-07-11 DIAGNOSIS — N2581 Secondary hyperparathyroidism of renal origin: Secondary | ICD-10-CM | POA: Diagnosis not present

## 2022-07-14 DIAGNOSIS — Z992 Dependence on renal dialysis: Secondary | ICD-10-CM | POA: Diagnosis not present

## 2022-07-14 DIAGNOSIS — N2581 Secondary hyperparathyroidism of renal origin: Secondary | ICD-10-CM | POA: Diagnosis not present

## 2022-07-14 DIAGNOSIS — N186 End stage renal disease: Secondary | ICD-10-CM | POA: Diagnosis not present

## 2022-07-16 DIAGNOSIS — N186 End stage renal disease: Secondary | ICD-10-CM | POA: Diagnosis not present

## 2022-07-16 DIAGNOSIS — Z992 Dependence on renal dialysis: Secondary | ICD-10-CM | POA: Diagnosis not present

## 2022-07-16 DIAGNOSIS — N2581 Secondary hyperparathyroidism of renal origin: Secondary | ICD-10-CM | POA: Diagnosis not present

## 2022-07-17 DIAGNOSIS — E039 Hypothyroidism, unspecified: Secondary | ICD-10-CM | POA: Diagnosis not present

## 2022-07-17 DIAGNOSIS — G894 Chronic pain syndrome: Secondary | ICD-10-CM | POA: Diagnosis not present

## 2022-07-17 DIAGNOSIS — I776 Arteritis, unspecified: Secondary | ICD-10-CM | POA: Diagnosis not present

## 2022-07-17 DIAGNOSIS — F3341 Major depressive disorder, recurrent, in partial remission: Secondary | ICD-10-CM | POA: Diagnosis not present

## 2022-07-17 DIAGNOSIS — F5101 Primary insomnia: Secondary | ICD-10-CM | POA: Diagnosis not present

## 2022-07-17 DIAGNOSIS — N186 End stage renal disease: Secondary | ICD-10-CM | POA: Diagnosis not present

## 2022-07-18 ENCOUNTER — Other Ambulatory Visit: Payer: Self-pay | Admitting: Nurse Practitioner

## 2022-07-18 ENCOUNTER — Telehealth: Payer: Self-pay | Admitting: Nurse Practitioner

## 2022-07-18 DIAGNOSIS — I340 Nonrheumatic mitral (valve) insufficiency: Secondary | ICD-10-CM

## 2022-07-18 DIAGNOSIS — Z992 Dependence on renal dialysis: Secondary | ICD-10-CM | POA: Diagnosis not present

## 2022-07-18 DIAGNOSIS — I5022 Chronic systolic (congestive) heart failure: Secondary | ICD-10-CM

## 2022-07-18 DIAGNOSIS — N186 End stage renal disease: Secondary | ICD-10-CM | POA: Diagnosis not present

## 2022-07-18 DIAGNOSIS — N2581 Secondary hyperparathyroidism of renal origin: Secondary | ICD-10-CM | POA: Diagnosis not present

## 2022-07-18 NOTE — Telephone Encounter (Signed)
  Pt's husband said, they are in a process for pt getting kidney transplant and pt need echo. He would like to request if Sharyn Lull can order echo and get it scheduled on the same day of her with appt on Tuesday

## 2022-07-18 NOTE — Telephone Encounter (Signed)
Patient's husband has spoken with our echo scheduler.

## 2022-07-20 NOTE — Progress Notes (Unsigned)
Cardiology Office Note:    Date:  07/22/2022   ID:  KYAN GIANNONE, DOB 1946/08/07, MRN [id number]  PCP:  Leeroy Cha, MD   [facility] Providers Cardiologist:  Sherren Mocha, MD     Referring MD: Leeroy Cha,*   Chief complaint: follow-up NICM, hypertension  History of Present Illness:    CATERINE MCMEANS is a 75 y.o. female with a hx of chronic HFrEF, NICM,  HTN, hypothyroid, positive ANCA rapid progressive glomerulonephritis,  ESRD on HD, interstitial lung disease, and moderate MR.    She was previously healthy, walking 5-6 miles per day and working out at the [facility] until she was diagnosed with Covid-19 infection in the fall of 2020.  She continued to have symptoms of "not feeling right." In June 2021, she was found to have significantly higher creatinine at PCP and subsequently presented  to the ED on 03/17/20 after an episode of vomiting and was found to have creatinine > 7, BUN 119, Na+ 109. 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 She was discharged on 04/10/20 but unfortunately returned to the ED on 04/16/20 with chest pain. She was readmitted and treated for acute respiratory failure with hypoxia thought to be multifactorial w/pulmonary edema and pulmonary vasculitis, multifocal pneumonia. Discharged on 05/03/20 after a total of 40 days combined hospitalization. Echo 7/21 revealed LVEF 55-60%, G1DD, moderate MR, moderate to severe AI.   On 01/15/21, she presented to the ED with SOB and chest pain. BNP was elevated at 3681, and echo on 01/16/21 revealed moderate  to severe  MR, and LVEF 30-35% with global hypokinesis, moderate AI. R/LHC on 01/17/21 revealed essentially normal coronary arteries and normal filling pressures. She did not appear to have a large V wave although by echo she has severe MR. She was discharged later  that day.   Echo on 06/13/21 revealed improved to 40-45%, moderate mitral valve stenosis. Sent message on 06/13/21 about soft BP on dialysis days which slows the process. Dr. Burt Knack advised her to hold Coreg on the mornings of dialysis and if this did not improve BP to the point that dialysis was not being delayed, she could d/c Coreg altogether.   She was seen in clinic by me on 10/01/21 accompanied by her husband. Working on testing for kidney transplant and reports she has been approved for transplant at [facility] in [place]. He daughter is testing to be her donor. She is holding her Coreg on dialysis days. 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 Home BP and pulse readings stable. Limited to 40 oz of fluids by nephrology.   Seen by Dr. Burt Knack on 02/13/2022.  She reported she was scheduled for renal transplant in February but was found to have multifocal pneumonia on preoperative imaging.  Echocardiogram at [facility] clinic as part of renal transplant evaluation revealed normal LV function, significant mitral stenosis with severe MAC, and mild mitral regurgitation.  She was also noted to have mild to moderate aortic insufficiency and mild aortic stenosis.  Today, she is here with her husband. Is being worked up for renal transplant at [facility]. Needs updated echocardiogram which she will have today. Continues to exercise, does elliptical machine for 30 min several days per week. Having episodes of hypotension during dialysis, has passed out on 3 occasions and had incontinence of bowel and bladder one one occasion. Has fatigue after dialysis. Husband closely  monitors her BP.  He has a months worth of data that has been reviewed today. We will adjust medication for hypotension. She denies chest pain, shortness of breath, lower extremity edema, palpitations, melena, hematuria, hemoptysis, diaphoresis, weakness, presyncope, syncope, orthopnea, and PND.  Past Medical History:  Diagnosis Date    Arthritis    Bruises easily    Cancer ([place])    basal skin cancer   Chronic back pain    HNP/stenosis and radiculopathy   Hyperlipidemia    takes Pravastatin daily   Hypertension    takes Hyzaar and toprol daily   Hypothyroidism    takes Synthroid daily   Insomnia    takes Elevil nightly   Joint pain    Joint swelling    Low BP    past some sedation   Nocturia    PONV (postoperative nausea and vomiting)    with knee replacement b/p dropped   Sinusitis    finished zpak yesterday   Urinary frequency     Past Surgical History:  Procedure Laterality Date   A/V FISTULAGRAM N/A 05/17/2020   Procedure: A/V FISTULAGRAM - Left Arm;  Surgeon: Marty Heck, MD;  Location: [place] [facility];  Service: Cardiovascular;  Laterality: N/A;   ABDOMINAL HYSTERECTOMY     AV FISTULA PLACEMENT Left 04/02/2020   Procedure: ARTERIOVENOUS (AV) FISTULA CREATION;  Surgeon: Rosetta Posner, MD;  Location: [place];  Service: Vascular;  Laterality: Left;   BACK SURGERY     basal cell skin cancer     on face and forehead   bil knee replacements     BREAST BIOPSY     left breast/benign   COLONOSCOPY     IR FLUORO GUIDE CV LINE RIGHT  03/20/2020   IR US GUIDE VASC ACCESS RIGHT  03/20/2020   LUMBAR LAMINECTOMY/DECOMPRESSION MICRODISCECTOMY  08/13/2012   Procedure: LUMBAR LAMINECTOMY/DECOMPRESSION MICRODISCECTOMY 1 LEVEL;  Surgeon: Erline Levine, MD;  Location: [place] [facility];  Service: Neurosurgery;  Laterality: Left;  Left lumbar one-two Microdiskectomy   PERIPHERAL VASCULAR BALLOON ANGIOPLASTY Left 05/17/2020   Procedure: PERIPHERAL VASCULAR BALLOON ANGIOPLASTY;  Surgeon: Marty Heck, MD;  Location: [place] [facility];  Service: Cardiovascular;  Laterality: Left;  Arm fistula    RIGHT/LEFT HEART CATH AND CORONARY ANGIOGRAPHY N/A 01/17/2021   Procedure: RIGHT/LEFT HEART CATH AND CORONARY ANGIOGRAPHY;  Surgeon: Lorretta Harp, MD;  Location: [place] [facility];  Service: Cardiovascular;   Laterality: N/A;    Current Medications: Current Meds  Medication Sig   acetaminophen (TYLENOL) 500 MG tablet Take 500-1,000 mg by mouth daily as needed (for pain).   escitalopram (LEXAPRO) 20 MG tablet Take 20 mg by mouth daily.   HYDROcodone-acetaminophen (NORCO/VICODIN) 5-325 MG tablet Take 0.5 tablets by mouth at bedtime as needed (for pain).   loratadine (CLARITIN) 10 MG tablet Take 10 mg by mouth daily.   metoprolol succinate (TOPROL XL) 25 MG 24 hr tablet Take one half (0.5 mg) tablet (12.5 mg) by mouth if systolic is > 793. Take one (1) tablet (25 mg) by mouth is systolic is > 903.   predniSONE (DELTASONE) 20 MG tablet Take 20 mg by mouth daily with breakfast. Taking 40 mg until March 18, 2022   sucroferric oxyhydroxide (VELPHORO) 500 MG chewable tablet Chew by mouth.   SYNTHROID 88 MCG tablet TAKE 1 TABLET BY MOUTH ONCE DAILY BEFORE BREAKFAST   valACYclovir (VALTREX) 500 MG tablet TAKE 1 TABLET BY MOUTH AS DIRECTED 1 TABLET BY MOUTH  ON DIALYSIS DAYS ONLY   zolpidem (AMBIEN) 5 MG tablet Take 1 tablet by mouth at bedtime.   [DISCONTINUED] carvedilol (COREG) 3.125 MG tablet Take 1 tablet (3.125 mg total) by mouth 2 (two) times daily with a meal. Pt will hold on Dialysis days     Allergies:   Hydrochlorothiazide, Other, Percocet [oxycodone-acetaminophen], and Tramadol   Social History   Socioeconomic History   Marital status: Married    Spouse name: Ray   Number of children: 2   Years of education: Not on file   Highest education level: Not on file  Occupational History   Occupation: retired  Tobacco Use   Smoking status: Never   Smokeless tobacco: Never  Vaping Use   Vaping Use: Never used  Substance and Sexual Activity   Alcohol use: No    Alcohol/week: 0.0 standard drinks of alcohol   Drug use: No   Sexual activity: Not Currently  Other Topics Concern   Not on file  Social History Narrative   Not on file   Social Determinants of Health   Financial Resource  Strain: Not on file  Food Insecurity: No Food Insecurity (07/03/2022)   Hunger Vital Sign    Worried About Running Out of Food in the Last Year: Never true    Ran Out of Food in the Last Year: Never true  Transportation Needs: No Transportation Needs (07/03/2022)   PRAPARE - Hydrologist (Medical): No    Lack of Transportation (Non-Medical): No  Physical Activity: Not on file  Stress: Not on file  Social Connections: Not on file     Family History: The patient's family history includes Alcohol abuse in her father; Arthritis in her brother and brother; Cancer (age of onset: 34) in her brother; Heart disease in her father and mother; Hypertension in her mother; Stroke in her father. There is no history of Colon cancer, Colon polyps, Stomach cancer, or Rectal cancer.  ROS:   Please see the history of present illness. All other systems reviewed and are negative.  Labs/Other Studies Reviewed:    The following studies were reviewed today:  Echo ([place]) 11/05/21: CONCLUSIONS   1. Left ventricle: The left ventricular cavity size is normal. Wall     thickness is normal. Systolic function is normal by the biplane     method of disks. The ejection fraction is 58% (+/-5%). No     segmental wall motion abnormalities.  2. Right ventricle: The right ventricular cavity size is normal.     Systolic function is normal.  3. Aortic valve: The aortic valve is tricuspid with mildly to     moderately thickened and calcified leaflets. There is mild     aortic stenosis. There is mild to moderate aortic regurgitation.  4. Mitral valve: Mildly to moderately thickened leaflets. Moderete     to severe posterior mitral annular calcification. Calcified     chordae are also identified. There is insignificant mitral     stenosis. There is mild mitral regurgitation.   No previous study was available for comparison.     Echo 06/13/21  Left Ventricle: Left ventricular ejection  fraction, by estimation, is 40  to 45%. The left ventricle has mildly decreased function. The left  ventricle demonstrates global hypokinesis. The left ventricular internal  cavity size was normal in size. There is  mild concentric left ventricular hypertrophy. Left ventricular diastolic parameters are indeterminate.  Right Ventricle: The right ventricular size is normal. No  increase in  right ventricular wall thickness. Right ventricular systolic function is  normal. Tricuspid regurgitation signal is inadequate for assessing PA  pressure.  Left Atrium: Left atrial size was mildly dilated.  Right Atrium: Right atrial size was normal in size.  Pericardium: There is no evidence of pericardial effusion.  Mitral Valve: The mitral valve is abnormal. There is moderate thickening  of the mitral valve leaflet(s). There is moderate calcification of the  mitral valve leaflet(s). Moderate mitral annular calcification. Moderate  mitral valve regurgitation.  Tricuspid Valve: The tricuspid valve is normal in structure. Tricuspid  valve regurgitation is trivial. No evidence of tricuspid stenosis.  Aortic Valve: The aortic valve is tricuspid. There is moderate  calcification of the aortic valve. There is mild thickening of the aortic  valve. There is mild aortic valve annular calcification. Aortic valve  regurgitation is moderate. Aortic regurgitation PHT measures 517 msec. Mild to moderate aortic valve sclerosis/calcification is present, without any evidence of aortic stenosis.  Pulmonic Valve: The pulmonic valve was not well visualized. Pulmonic valve  regurgitation is trivial. No evidence of pulmonic stenosis.  Aorta: The aortic root, ascending aorta and aortic arch are all  structurally normal, with no evidence of dilitation or obstruction. There  is borderline dilatation of the ascending aorta, measuring 38 mm.  Venous: The inferior vena cava is normal in size with greater than 50%  respiratory  variability, suggesting right atrial pressure of 3 mmHg.  IAS/Shunts: The atrial septum is grossly normal.    R/LHC 01/17/21  IMPRESSION: Ms. Emmerich has essentially normal coronary arteries and normal filling pressures.  She appears to be euvolemic.  She did not appear to have a large V wave although by echo she has severe mitral regurgitation.  Guideline directed optimal medical therapy will be recommended.  She may be a candidate for MitraClip which Dr. Burt Knack will address.  The right common femoral vein had Mynx closure successfully.  The arterial sheath will be pulled manually given the fact that there is very little subcutaneous tissue.  The patient left lab in stable condition.    Recent Labs: No results found for requested labs within last 365 days.  Recent Lipid Panel    Component Value Date/Time   CHOL 130 01/17/2021 1020   CHOL 173 03/01/2020 0927   CHOL 169 12/21/2012 1552   TRIG 84 01/17/2021 1020   TRIG 97 12/26/2014 1150   TRIG 64 12/21/2012 1552   HDL 60 01/17/2021 1020   HDL 67 03/01/2020 0927   HDL 75 12/26/2014 1150   HDL 63 12/21/2012 1552   CHOLHDL 2.2 01/17/2021 1020   VLDL 17 01/17/2021 1020   LDLCALC 53 01/17/2021 1020   LDLCALC 94 03/01/2020 0927   LDLCALC 102 (H) 07/06/2014 0912   LDLCALC 93 12/21/2012 1552     Physical Exam:    VS:  BP 124/66   Pulse 67   Ht _0  (1.676 m)   Wt 141 lb (64 kg)   SpO2 95%   BMI 22.76 kg/m     Wt Readings from Last 3 Encounters:  07/22/22 141 lb (64 kg)  02/28/22 125 lb 3.2 oz (56.8 kg)  02/13/22 127 lb 12.8 oz (58 kg)     GEN:  Well nourished, well developed in no acute distress HEENT: Normal NECK: No JVD; No carotid bruits LYMPHATICS: No lymphadenopathy CARDIAC: RRR. 3/6 systolic murmur heard throughout, rubs, gallops RESPIRATORY:  Clear to auscultation without rales, wheezing or rhonchi  ABDOMEN: Soft, non-tender,  non-distended MUSCULOSKELETAL:  No edema; No deformity  SKIN: Warm and dry NEUROLOGIC:   Alert and oriented x 3 PSYCHIATRIC:  Normal affect   EKG:  EKG is not ordered today.    Diagnoses:    1. NICM (nonischemic cardiomyopathy) ([place])   2. Chronic HFrEF (heart failure with reduced ejection fraction) ([place])   3. Moderate mitral regurgitation   4. Essential hypertension   5. ESRD (end stage renal disease) ([place])    Assessment and Plan:     Chronic HFrEF/NICM: Echo 9/22 shows improved LVEF 40-45%, previously 30 to 35% in April 2022. Appears euvolemic on exam today. Denies dyspnea, edema, PND, orthopnea. Repeating echo today in the setting of evaluation for renal transplant.  As noted below, changing carvedilol to low-dose metoprolol succinate to increase BP.  Moderate mitral regurgitation: Moderate to severe mitral valve regurgitation on echo 05/2021. Undergoing workup for renal transplant. No evidence today of worsening valve function.   Essential hypertension: BP is soft on a consistent basis. Home BP readings reviewed. SBP is consistently < 110 mmHg. Has had episodes of presyncope, syncope at dialysis. Has been taking carvedilol 3.125 mg just once daily after dialysis on dialysis days and twice daily on other days.  We will change to metoprolol succinate 12.5 mg only if SBP > 120 mmHg and 25 mg if SBP > 135 mmHg.  Advised her and husband to notify us if there are concerns with this new regimen.  ESRD: In center hemodialysis MWF. Episodes of hypotension as noted above. Will change anti-hypertensive therapy to see if BP improves. Currently undergoing evaluation for renal transplant at [facility] in [place].     Disposition: 6 months with Dr. Burt Knack  Medication Adjustments/Labs and Tests Ordered: Current medicines are reviewed at length with the patient today.  Concerns regarding medicines are outlined above.  No orders of the defined types were placed in this encounter.  Meds ordered this encounter  Medications   metoprolol succinate (TOPROL XL) 25 MG 24 hr tablet     Sig: Take one half (0.5 mg) tablet (12.5 mg) by mouth if systolic is > 161. Take one (1) tablet (25 mg) by mouth is systolic is > 096.    Dispense:  90 tablet    Refill:  3    Patient Instructions  Medication Instructions:   DISCONTINUE Coreg.  START Toprol one half (0.5 mg) by mouth ( 04.5 mg) if systolic is greater than 409.  Take one (1) tablet by mouth (25 mg) if systolic is greater than 811.   *If you need a refill on your cardiac medications before your next appointment, please call your pharmacy*   Lab Work:  None ordered.  If you have labs (blood work) drawn today and your tests are completely normal, you will receive your results only by: [place] (if you have MyChart) OR A paper copy in the mail If you have any lab test that is abnormal or we need to change your treatment, we will call you to review the results.   Testing/Procedures:  None ordered.   Follow-Up: At [facility], you and your health needs are our priority.  As part of our continuing mission to provide you with exceptional heart care, we have created designated Provider Care Teams.  These Care Teams include your primary Cardiologist (physician) and Advanced Practice Providers (APPs -  Physician Assistants and Nurse Practitioners) who all work together to provide you with the care you need, when you need it.  We recommend signing up for the patient portal called "MyChart".  Sign up information is provided on this After Visit Summary.  MyChart is used to connect with patients for Virtual Visits (Telemedicine).  Patients are able to view lab/test results, encounter notes, upcoming appointments, etc.  Non-urgent messages can be sent to your provider as well.   To learn more about what you can do with MyChart, go to [email or web address].    Your next appointment:   6 month(s)  The format for your next appointment:   In Person  Provider:   Sherren Mocha, MD     Important Information  About Sugar         Signed, Emmaline Life, NP  07/22/2022 1:43 PM    [place]

## 2022-07-21 DIAGNOSIS — Z992 Dependence on renal dialysis: Secondary | ICD-10-CM | POA: Diagnosis not present

## 2022-07-21 DIAGNOSIS — N2581 Secondary hyperparathyroidism of renal origin: Secondary | ICD-10-CM | POA: Diagnosis not present

## 2022-07-21 DIAGNOSIS — N186 End stage renal disease: Secondary | ICD-10-CM | POA: Diagnosis not present

## 2022-07-22 ENCOUNTER — Encounter: Payer: Self-pay | Admitting: Nurse Practitioner

## 2022-07-22 ENCOUNTER — Ambulatory Visit (HOSPITAL_BASED_OUTPATIENT_CLINIC_OR_DEPARTMENT_OTHER): Payer: Medicare PPO

## 2022-07-22 ENCOUNTER — Ambulatory Visit: Payer: Medicare PPO | Attending: Nurse Practitioner | Admitting: Nurse Practitioner

## 2022-07-22 ENCOUNTER — Ambulatory Visit: Payer: Medicare PPO | Admitting: Nurse Practitioner

## 2022-07-22 VITALS — BP 124/66 | HR 67 | Ht 66.0 in | Wt 141.0 lb

## 2022-07-22 DIAGNOSIS — H52203 Unspecified astigmatism, bilateral: Secondary | ICD-10-CM | POA: Diagnosis not present

## 2022-07-22 DIAGNOSIS — I340 Nonrheumatic mitral (valve) insufficiency: Secondary | ICD-10-CM | POA: Diagnosis not present

## 2022-07-22 DIAGNOSIS — N019 Rapidly progressive nephritic syndrome with unspecified morphologic changes: Secondary | ICD-10-CM | POA: Diagnosis not present

## 2022-07-22 DIAGNOSIS — H353131 Nonexudative age-related macular degeneration, bilateral, early dry stage: Secondary | ICD-10-CM | POA: Diagnosis not present

## 2022-07-22 DIAGNOSIS — I5022 Chronic systolic (congestive) heart failure: Secondary | ICD-10-CM | POA: Diagnosis not present

## 2022-07-22 DIAGNOSIS — I428 Other cardiomyopathies: Secondary | ICD-10-CM | POA: Diagnosis not present

## 2022-07-22 DIAGNOSIS — N186 End stage renal disease: Secondary | ICD-10-CM | POA: Diagnosis not present

## 2022-07-22 DIAGNOSIS — I10 Essential (primary) hypertension: Secondary | ICD-10-CM | POA: Diagnosis not present

## 2022-07-22 DIAGNOSIS — Z992 Dependence on renal dialysis: Secondary | ICD-10-CM | POA: Diagnosis not present

## 2022-07-22 DIAGNOSIS — H1820 Unspecified corneal edema: Secondary | ICD-10-CM | POA: Diagnosis not present

## 2022-07-22 LAB — ECHOCARDIOGRAM COMPLETE
AV Vena cont: 0.4 cm
Area-P 1/2: 4.21 cm2
Calc EF: 49.6 %
Height: 66 in
MV M vel: 5.68 m/s
MV Peak grad: 129 mmHg
MV Vena cont: 0.3 cm
P 1/2 time: 341 msec
Radius: 0.8 cm
S' Lateral: 4 cm
Single Plane A2C EF: 50.4 %
Single Plane A4C EF: 49 %
Weight: 2256 oz

## 2022-07-22 MED ORDER — METOPROLOL SUCCINATE ER 25 MG PO TB24: ORAL_TABLET | ORAL | 3 refills | Status: DC

## 2022-07-22 NOTE — Patient Instructions (Signed)
Medication Instructions:   DISCONTINUE Coreg.  START Toprol one half (0.5 mg) by mouth ( 56.7 mg) if systolic is greater than 014.  Take one (1) tablet by mouth (25 mg) if systolic is greater than 103.   *If you need a refill on your cardiac medications before your next appointment, please call your pharmacy*   Lab Work:  None ordered.  If you have labs (blood work) drawn today and your tests are completely normal, you will receive your results only by: [place] (if you have MyChart) OR A paper copy in the mail If you have any lab test that is abnormal or we need to change your treatment, we will call you to review the results.   Testing/Procedures:  None ordered.   Follow-Up: At [facility], you and your health needs are our priority.  As part of our continuing mission to provide you with exceptional heart care, we have created designated Provider Care Teams.  These Care Teams include your primary Cardiologist (physician) and Advanced Practice Providers (APPs -  Physician Assistants and Nurse Practitioners) who all work together to provide you with the care you need, when you need it.  We recommend signing up for the patient portal called "MyChart".  Sign up information is provided on this After Visit Summary.  MyChart is used to connect with patients for Virtual Visits (Telemedicine).  Patients are able to view lab/test results, encounter notes, upcoming appointments, etc.  Non-urgent messages can be sent to your provider as well.   To learn more about what you can do with MyChart, go to [email or web address].    Your next appointment:   6 month(s)  The format for your next appointment:   In Person  Provider:   Sherren Mocha, MD     Important Information About Sugar

## 2022-07-23 ENCOUNTER — Other Ambulatory Visit (HOSPITAL_COMMUNITY): Payer: Medicare PPO

## 2022-07-23 DIAGNOSIS — N186 End stage renal disease: Secondary | ICD-10-CM | POA: Diagnosis not present

## 2022-07-23 DIAGNOSIS — Z992 Dependence on renal dialysis: Secondary | ICD-10-CM | POA: Diagnosis not present

## 2022-07-23 DIAGNOSIS — N019 Rapidly progressive nephritic syndrome with unspecified morphologic changes: Secondary | ICD-10-CM | POA: Diagnosis not present

## 2022-07-23 DIAGNOSIS — N2581 Secondary hyperparathyroidism of renal origin: Secondary | ICD-10-CM | POA: Diagnosis not present

## 2022-07-25 DIAGNOSIS — N186 End stage renal disease: Secondary | ICD-10-CM | POA: Diagnosis not present

## 2022-07-25 DIAGNOSIS — Z992 Dependence on renal dialysis: Secondary | ICD-10-CM | POA: Diagnosis not present

## 2022-07-25 DIAGNOSIS — N2581 Secondary hyperparathyroidism of renal origin: Secondary | ICD-10-CM | POA: Diagnosis not present

## 2022-07-28 DIAGNOSIS — Z992 Dependence on renal dialysis: Secondary | ICD-10-CM | POA: Diagnosis not present

## 2022-07-28 DIAGNOSIS — N2581 Secondary hyperparathyroidism of renal origin: Secondary | ICD-10-CM | POA: Diagnosis not present

## 2022-07-28 DIAGNOSIS — N186 End stage renal disease: Secondary | ICD-10-CM | POA: Diagnosis not present

## 2022-07-30 DIAGNOSIS — N186 End stage renal disease: Secondary | ICD-10-CM | POA: Diagnosis not present

## 2022-07-30 DIAGNOSIS — N2581 Secondary hyperparathyroidism of renal origin: Secondary | ICD-10-CM | POA: Diagnosis not present

## 2022-07-30 DIAGNOSIS — Z992 Dependence on renal dialysis: Secondary | ICD-10-CM | POA: Diagnosis not present

## 2022-07-31 DIAGNOSIS — Z01818 Encounter for other preprocedural examination: Secondary | ICD-10-CM | POA: Diagnosis not present

## 2022-07-31 DIAGNOSIS — R918 Other nonspecific abnormal finding of lung field: Secondary | ICD-10-CM | POA: Diagnosis not present

## 2022-07-31 DIAGNOSIS — Z940 Kidney transplant status: Secondary | ICD-10-CM | POA: Diagnosis not present

## 2022-07-31 DIAGNOSIS — N186 End stage renal disease: Secondary | ICD-10-CM | POA: Diagnosis not present

## 2022-08-01 DIAGNOSIS — N2581 Secondary hyperparathyroidism of renal origin: Secondary | ICD-10-CM | POA: Diagnosis not present

## 2022-08-01 DIAGNOSIS — Z992 Dependence on renal dialysis: Secondary | ICD-10-CM | POA: Diagnosis not present

## 2022-08-01 DIAGNOSIS — N186 End stage renal disease: Secondary | ICD-10-CM | POA: Diagnosis not present

## 2022-08-04 DIAGNOSIS — N2581 Secondary hyperparathyroidism of renal origin: Secondary | ICD-10-CM | POA: Diagnosis not present

## 2022-08-04 DIAGNOSIS — N186 End stage renal disease: Secondary | ICD-10-CM | POA: Diagnosis not present

## 2022-08-04 DIAGNOSIS — Z992 Dependence on renal dialysis: Secondary | ICD-10-CM | POA: Diagnosis not present

## 2022-08-05 DIAGNOSIS — N2581 Secondary hyperparathyroidism of renal origin: Secondary | ICD-10-CM | POA: Diagnosis not present

## 2022-08-05 DIAGNOSIS — N186 End stage renal disease: Secondary | ICD-10-CM | POA: Diagnosis not present

## 2022-08-05 DIAGNOSIS — Z992 Dependence on renal dialysis: Secondary | ICD-10-CM | POA: Diagnosis not present

## 2022-08-06 DIAGNOSIS — D631 Anemia in chronic kidney disease: Secondary | ICD-10-CM | POA: Diagnosis not present

## 2022-08-06 DIAGNOSIS — R918 Other nonspecific abnormal finding of lung field: Secondary | ICD-10-CM | POA: Diagnosis not present

## 2022-08-06 DIAGNOSIS — Z992 Dependence on renal dialysis: Secondary | ICD-10-CM | POA: Diagnosis not present

## 2022-08-06 DIAGNOSIS — N2581 Secondary hyperparathyroidism of renal origin: Secondary | ICD-10-CM | POA: Diagnosis not present

## 2022-08-06 DIAGNOSIS — Z524 Kidney donor: Secondary | ICD-10-CM | POA: Diagnosis not present

## 2022-08-06 DIAGNOSIS — I132 Hypertensive heart and chronic kidney disease with heart failure and with stage 5 chronic kidney disease, or end stage renal disease: Secondary | ICD-10-CM | POA: Diagnosis not present

## 2022-08-06 DIAGNOSIS — Z452 Encounter for adjustment and management of vascular access device: Secondary | ICD-10-CM | POA: Diagnosis not present

## 2022-08-06 DIAGNOSIS — I953 Hypotension of hemodialysis: Secondary | ICD-10-CM | POA: Diagnosis not present

## 2022-08-06 DIAGNOSIS — I1311 Hypertensive heart and chronic kidney disease without heart failure, with stage 5 chronic kidney disease, or end stage renal disease: Secondary | ICD-10-CM | POA: Diagnosis not present

## 2022-08-06 DIAGNOSIS — N250 Renal osteodystrophy: Secondary | ICD-10-CM | POA: Diagnosis not present

## 2022-08-06 DIAGNOSIS — E039 Hypothyroidism, unspecified: Secondary | ICD-10-CM | POA: Diagnosis not present

## 2022-08-06 DIAGNOSIS — I509 Heart failure, unspecified: Secondary | ICD-10-CM | POA: Diagnosis not present

## 2022-08-06 DIAGNOSIS — Z940 Kidney transplant status: Secondary | ICD-10-CM | POA: Diagnosis not present

## 2022-08-06 DIAGNOSIS — I428 Other cardiomyopathies: Secondary | ICD-10-CM | POA: Diagnosis not present

## 2022-08-06 DIAGNOSIS — N186 End stage renal disease: Secondary | ICD-10-CM | POA: Diagnosis not present

## 2022-08-06 DIAGNOSIS — I7782 Antineutrophilic cytoplasmic antibody (ANCA) vasculitis: Secondary | ICD-10-CM | POA: Diagnosis not present

## 2022-08-11 DIAGNOSIS — Z940 Kidney transplant status: Secondary | ICD-10-CM | POA: Diagnosis not present

## 2022-08-11 DIAGNOSIS — E039 Hypothyroidism, unspecified: Secondary | ICD-10-CM | POA: Diagnosis not present

## 2022-08-11 DIAGNOSIS — Z79899 Other long term (current) drug therapy: Secondary | ICD-10-CM | POA: Diagnosis not present

## 2022-08-11 DIAGNOSIS — Z792 Long term (current) use of antibiotics: Secondary | ICD-10-CM | POA: Diagnosis not present

## 2022-08-11 DIAGNOSIS — Z130 Encounter for screening for diseases of the blood and blood-forming organs and certain disorders involving the immune mechanism: Secondary | ICD-10-CM | POA: Diagnosis not present

## 2022-08-11 DIAGNOSIS — Z1321 Encounter for screening for nutritional disorder: Secondary | ICD-10-CM | POA: Diagnosis not present

## 2022-08-13 DIAGNOSIS — E039 Hypothyroidism, unspecified: Secondary | ICD-10-CM | POA: Diagnosis not present

## 2022-08-13 DIAGNOSIS — Z792 Long term (current) use of antibiotics: Secondary | ICD-10-CM | POA: Diagnosis not present

## 2022-08-13 DIAGNOSIS — Z79899 Other long term (current) drug therapy: Secondary | ICD-10-CM | POA: Diagnosis not present

## 2022-08-13 DIAGNOSIS — Z940 Kidney transplant status: Secondary | ICD-10-CM | POA: Diagnosis not present

## 2022-08-18 DIAGNOSIS — Z940 Kidney transplant status: Secondary | ICD-10-CM | POA: Diagnosis not present

## 2022-08-18 DIAGNOSIS — Z79899 Other long term (current) drug therapy: Secondary | ICD-10-CM | POA: Diagnosis not present

## 2022-08-18 DIAGNOSIS — Z792 Long term (current) use of antibiotics: Secondary | ICD-10-CM | POA: Diagnosis not present

## 2022-08-18 DIAGNOSIS — E039 Hypothyroidism, unspecified: Secondary | ICD-10-CM | POA: Diagnosis not present

## 2022-08-20 DIAGNOSIS — Z4822 Encounter for aftercare following kidney transplant: Secondary | ICD-10-CM | POA: Diagnosis not present

## 2022-08-20 DIAGNOSIS — Z940 Kidney transplant status: Secondary | ICD-10-CM | POA: Diagnosis not present

## 2022-08-20 DIAGNOSIS — N186 End stage renal disease: Secondary | ICD-10-CM | POA: Diagnosis not present

## 2022-08-20 DIAGNOSIS — Z01818 Encounter for other preprocedural examination: Secondary | ICD-10-CM | POA: Diagnosis not present

## 2022-08-25 DIAGNOSIS — Z79899 Other long term (current) drug therapy: Secondary | ICD-10-CM | POA: Diagnosis not present

## 2022-08-25 DIAGNOSIS — E039 Hypothyroidism, unspecified: Secondary | ICD-10-CM | POA: Diagnosis not present

## 2022-08-25 DIAGNOSIS — Z940 Kidney transplant status: Secondary | ICD-10-CM | POA: Diagnosis not present

## 2022-08-25 DIAGNOSIS — Z792 Long term (current) use of antibiotics: Secondary | ICD-10-CM | POA: Diagnosis not present

## 2022-08-28 DIAGNOSIS — Z940 Kidney transplant status: Secondary | ICD-10-CM | POA: Diagnosis not present

## 2022-08-28 DIAGNOSIS — Z466 Encounter for fitting and adjustment of urinary device: Secondary | ICD-10-CM | POA: Diagnosis not present

## 2022-08-28 DIAGNOSIS — Z792 Long term (current) use of antibiotics: Secondary | ICD-10-CM | POA: Diagnosis not present

## 2022-08-28 DIAGNOSIS — Z79899 Other long term (current) drug therapy: Secondary | ICD-10-CM | POA: Diagnosis not present

## 2022-09-04 DIAGNOSIS — Z940 Kidney transplant status: Secondary | ICD-10-CM | POA: Diagnosis not present

## 2022-09-04 DIAGNOSIS — Z79899 Other long term (current) drug therapy: Secondary | ICD-10-CM | POA: Diagnosis not present

## 2022-09-04 DIAGNOSIS — T8610 Unspecified complication of kidney transplant: Secondary | ICD-10-CM | POA: Diagnosis not present

## 2022-09-04 DIAGNOSIS — T8619 Other complication of kidney transplant: Secondary | ICD-10-CM | POA: Diagnosis not present

## 2022-09-04 DIAGNOSIS — B349 Viral infection, unspecified: Secondary | ICD-10-CM | POA: Diagnosis not present

## 2022-09-04 DIAGNOSIS — B259 Cytomegaloviral disease, unspecified: Secondary | ICD-10-CM | POA: Diagnosis not present

## 2022-09-08 ENCOUNTER — Other Ambulatory Visit: Payer: Self-pay | Admitting: Internal Medicine

## 2022-09-08 DIAGNOSIS — Z1231 Encounter for screening mammogram for malignant neoplasm of breast: Secondary | ICD-10-CM

## 2022-09-11 DIAGNOSIS — Z79899 Other long term (current) drug therapy: Secondary | ICD-10-CM | POA: Diagnosis not present

## 2022-09-11 DIAGNOSIS — Z792 Long term (current) use of antibiotics: Secondary | ICD-10-CM | POA: Diagnosis not present

## 2022-09-11 DIAGNOSIS — E039 Hypothyroidism, unspecified: Secondary | ICD-10-CM | POA: Diagnosis not present

## 2022-09-11 DIAGNOSIS — Z940 Kidney transplant status: Secondary | ICD-10-CM | POA: Diagnosis not present

## 2022-09-18 DIAGNOSIS — B259 Cytomegaloviral disease, unspecified: Secondary | ICD-10-CM | POA: Diagnosis not present

## 2022-09-18 DIAGNOSIS — T8619 Other complication of kidney transplant: Secondary | ICD-10-CM | POA: Diagnosis not present

## 2022-09-18 DIAGNOSIS — Z940 Kidney transplant status: Secondary | ICD-10-CM | POA: Diagnosis not present

## 2022-09-18 DIAGNOSIS — Z79899 Other long term (current) drug therapy: Secondary | ICD-10-CM | POA: Diagnosis not present

## 2022-09-18 DIAGNOSIS — T8610 Unspecified complication of kidney transplant: Secondary | ICD-10-CM | POA: Diagnosis not present

## 2022-09-18 DIAGNOSIS — B349 Viral infection, unspecified: Secondary | ICD-10-CM | POA: Diagnosis not present

## 2022-09-25 DIAGNOSIS — Z792 Long term (current) use of antibiotics: Secondary | ICD-10-CM | POA: Diagnosis not present

## 2022-09-25 DIAGNOSIS — Z940 Kidney transplant status: Secondary | ICD-10-CM | POA: Diagnosis not present

## 2022-09-25 DIAGNOSIS — E039 Hypothyroidism, unspecified: Secondary | ICD-10-CM | POA: Diagnosis not present

## 2022-09-25 DIAGNOSIS — Z79899 Other long term (current) drug therapy: Secondary | ICD-10-CM | POA: Diagnosis not present

## 2022-09-25 DIAGNOSIS — Z949 Transplanted organ and tissue status, unspecified: Secondary | ICD-10-CM | POA: Diagnosis not present

## 2022-09-30 ENCOUNTER — Encounter: Payer: Self-pay | Admitting: Pulmonary Disease

## 2022-09-30 ENCOUNTER — Ambulatory Visit: Payer: Medicare PPO | Admitting: Pulmonary Disease

## 2022-09-30 VITALS — BP 130/78 | HR 92 | Temp 97.6°F | Ht 66.0 in | Wt 141.0 lb

## 2022-09-30 DIAGNOSIS — I7782 Antineutrophilic cytoplasmic antibody (ANCA) vasculitis: Secondary | ICD-10-CM | POA: Diagnosis not present

## 2022-09-30 NOTE — Patient Instructions (Signed)
Follow up in 1 year.

## 2022-09-30 NOTE — Progress Notes (Signed)
[facility]  Chief Complaint  Patient presents with   Follow-up    Pt had kidney transplant 07/2022 Feeling well no new concerns     Constitutional:  BP 130/78   Pulse 92   Temp 97.6 F (36.4 C)   Ht '5\' 6"'$  (1.676 m)   Wt 141 lb (64 kg)   SpO2 96% Comment: ra  BMI 22.76 kg/m   Past Medical History:  Chronic back pain, HTN, Hypothyroidism, CKD, HLD, COVID 11 August 2019  Past Surgical History:  Her  has a past surgical history that includes Back surgery; bil knee replacements; basal cell skin cancer; Lumbar laminectomy/decompression microdiscectomy (08/13/2012); Abdominal hysterectomy; Colonoscopy; IR US Guide Vasc Access Right (03/20/2020); IR Fluoro Guide CV Line Right (03/20/2020); AV fistula placement (Left, 04/02/2020); A/V Fistulagram (N/A, 05/17/2020); PERIPHERAL VASCULAR BALLOON ANGIOPLASTY (Left, 05/17/2020); Breast biopsy; and RIGHT/LEFT HEART CATH AND CORONARY ANGIOGRAPHY (N/A, 01/17/2021).  Brief Summary:  Jasmine White is a 77 y.o. 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      Subjective:   She is here with her husband.  She had renal transplant on 08/06/22.  Doing well since then.  Has noticed a lump in her AV graft on her Lt arm, but still has thrill in graft.  Not having cough, wheeze, sputum, or chest congestion.  Hasn't noticed any trouble with her breathing.  Sleeping okay.  Physical Exam:   Appearance - well kempt   ENMT - no sinus tenderness, no oral exudate, no LAN, Mallampati 2 airway, no stridor  Respiratory - equal breath sounds bilaterally, no wheezing or rales  CV - s1s2 regular rate and rhythm,2/6 SM  Ext - no clubbing, no edema  Skin - no rashes  Psych - normal mood and affect       Serology:  ANCA 03/18/20 >> MPO 10.3 (nl 0 - 9) GBM Ab 03/18/20 >> 137 (nl 0 - 20) ANA 6/27 >> positive,  ds DNA Ab 50 (nl < 5) GMB Ab 04/03/20 >> 55  Pulmonary testing:  Rt thoracentesis 04/05/20 >> 900 cc straw colored fluid, glucose 133, protein < 3, LDH 50, WBC 63 (72%M) Bronchoscopy 04/05/20 >> WBC 125 (68%N) PFT 07/24/20 >> FEV1 1.71 (74%), FEV1% 84, TLC 3.92 (73%), DLCO 59%  Chest Imaging:  V/Q scan 03/22/20 >> negative CT chest 03/24/20 >> mosaic areas of GGO and consolidation, mod b/l effusions CT chest 04/05/20 >> large b/l effusions with compressive ATX, extensive ASD HRCT 04/16/20 >> mod Rt and small Lt effusions, extensive/dense consolidation HRCT chest 08/07/20 >> resolution of previous ASD and b/l effusions; mild irregular peripheral interstitial opacity throughout the lungs with a slight apical to basal gradient HRCT chest 12/17/20 >> Rt greater than Lt pleural effusion, air trapping, atelectasis CT chest 11/08/21 >> multifocal consolidation CT chest 04/29/22 >> BTX RML, scattered micronodular clusters RUL  Cardiac Tests:  Echo 07/22/22 >> EF 45 to 50%, grade 1 DD, mod MR, mod AR, ascending aorta 40 mm  Social History:  She  reports that she has never smoked. She has never used smokeless tobacco. She reports that she does not drink alcohol and does not use drugs.  Family History:  Her family history includes Alcohol abuse in her father; Arthritis in her brother and brother; Cancer (age of onset: 29) in her brother; Heart disease in her father and mother; Hypertension in her mother; Stroke in her  father.     Assessment/Plan:   Interstitial lung disease with Diffuse Alveolar Hemorrhage in July 2021. - in setting of ANCA, GBM positive rapidly progressive glomerulonephritis - clinical status stable - also followed by Dr. Adele Barthel with [facility] in [place]  Chronic systolic CHF with mitral valve regurgitation. - followed by Dr. Sherren Mocha with [facility] in setting of Rapidly Progressive Glomerulonephritis s/p renal transplant in November 2023. -  followed by Dr. Marval Regal with [facility] - treated with rituximab in Summer of 2021 - followed by transplant team in [place] - likely has thrombosis of AV graft site >> no signs of infection; advised her to show nephrology at her next visit later this month  Time Spent Involved in Patient Care on Day of Examination:  26 minutes  Follow up:   Patient Instructions  Follow up in 1 year  Medication List:   Allergies as of 09/30/2022       Reactions   Hydrochlorothiazide Other (See Comments)   Hx of severe Hyponatremia while taking HCTZ - now contraindicated Other reaction(s): low sodium, Other (See Comments) Hx of severe Hyponatremia while taking HCTZ - now contraindicated Hx of severe Hyponatremia while taking HCTZ - now contraindicated   Other Other (See Comments)   If ever given "strong" pain medication, the patient will require something to counteract the nausea   Percocet [oxycodone-acetaminophen] Nausea Only   Tramadol Nausea Only, Other (See Comments)   Patient remarked this made her "feel crazy" (also) Other reaction(s): nausea, Nausea And Vomiting, Nausea Only Patient remarked this made her "feel crazy" (also)        Medication List        Accurate as of September 30, 2022  8:50 AM. If you have any questions, ask your nurse or doctor.          STOP taking these medications    acetaminophen 500 MG tablet Commonly known as: TYLENOL Stopped by: Chesley Mires, MD   HYDROcodone-acetaminophen 5-325 MG tablet Commonly known as: NORCO/VICODIN Stopped by: Chesley Mires, MD   metoprolol succinate 25 MG 24 hr tablet Commonly known as: Toprol XL Stopped by: Chesley Mires, MD   valACYclovir 500 MG tablet Commonly known as: VALTREX Stopped by: Chesley Mires, MD   Velphoro 500 MG chewable tablet Generic drug: sucroferric oxyhydroxide Stopped by: Chesley Mires, MD       TAKE these medications    aspirin EC 81 MG tablet Take by mouth.   escitalopram 20 MG  tablet Commonly known as: LEXAPRO Take 20 mg by mouth daily.   famotidine 20 MG tablet Commonly known as: PEPCID Take 20 mg by mouth 2 (two) times daily.   loratadine 10 MG tablet Commonly known as: CLARITIN Take 10 mg by mouth daily.   mycophenolate 500 MG tablet Commonly known as: CELLCEPT Take 1,000 mg by mouth 2 (two) times daily.   predniSONE 20 MG tablet Commonly known as: DELTASONE Take 20 mg by mouth daily with breakfast. Taking 40 mg until March 18, 2022   sulfamethoxazole-trimethoprim 400-80 MG tablet Commonly known as: BACTRIM Take 1 tablet by mouth daily.   Synthroid 88 MCG tablet Generic drug: levothyroxine TAKE 1 TABLET BY MOUTH ONCE DAILY BEFORE BREAKFAST   zolpidem 5 MG tablet Commonly known as: AMBIEN Take 1 tablet by mouth at bedtime.        Signature:  Chesley Mires, MD [place] Pager - [phone] 09/30/2022, 8:50 AM

## 2022-10-02 DIAGNOSIS — R799 Abnormal finding of blood chemistry, unspecified: Secondary | ICD-10-CM | POA: Diagnosis not present

## 2022-10-02 DIAGNOSIS — Z940 Kidney transplant status: Secondary | ICD-10-CM | POA: Diagnosis not present

## 2022-10-02 DIAGNOSIS — Z79899 Other long term (current) drug therapy: Secondary | ICD-10-CM | POA: Diagnosis not present

## 2022-10-09 DIAGNOSIS — Z940 Kidney transplant status: Secondary | ICD-10-CM | POA: Diagnosis not present

## 2022-10-09 DIAGNOSIS — Z792 Long term (current) use of antibiotics: Secondary | ICD-10-CM | POA: Diagnosis not present

## 2022-10-09 DIAGNOSIS — Z79899 Other long term (current) drug therapy: Secondary | ICD-10-CM | POA: Diagnosis not present

## 2022-10-16 DIAGNOSIS — Z79899 Other long term (current) drug therapy: Secondary | ICD-10-CM | POA: Diagnosis not present

## 2022-10-16 DIAGNOSIS — R799 Abnormal finding of blood chemistry, unspecified: Secondary | ICD-10-CM | POA: Diagnosis not present

## 2022-10-16 DIAGNOSIS — C44729 Squamous cell carcinoma of skin of left lower limb, including hip: Secondary | ICD-10-CM | POA: Diagnosis not present

## 2022-10-16 DIAGNOSIS — Z85828 Personal history of other malignant neoplasm of skin: Secondary | ICD-10-CM | POA: Diagnosis not present

## 2022-10-16 DIAGNOSIS — D485 Neoplasm of uncertain behavior of skin: Secondary | ICD-10-CM | POA: Diagnosis not present

## 2022-10-16 DIAGNOSIS — Z940 Kidney transplant status: Secondary | ICD-10-CM | POA: Diagnosis not present

## 2022-10-16 DIAGNOSIS — D225 Melanocytic nevi of trunk: Secondary | ICD-10-CM | POA: Diagnosis not present

## 2022-10-16 DIAGNOSIS — L905 Scar conditions and fibrosis of skin: Secondary | ICD-10-CM | POA: Diagnosis not present

## 2022-10-16 DIAGNOSIS — L280 Lichen simplex chronicus: Secondary | ICD-10-CM | POA: Diagnosis not present

## 2022-10-16 DIAGNOSIS — L570 Actinic keratosis: Secondary | ICD-10-CM | POA: Diagnosis not present

## 2022-10-23 DIAGNOSIS — N189 Chronic kidney disease, unspecified: Secondary | ICD-10-CM | POA: Diagnosis not present

## 2022-10-23 DIAGNOSIS — D631 Anemia in chronic kidney disease: Secondary | ICD-10-CM | POA: Diagnosis not present

## 2022-10-23 DIAGNOSIS — Z949 Transplanted organ and tissue status, unspecified: Secondary | ICD-10-CM | POA: Diagnosis not present

## 2022-10-23 DIAGNOSIS — Z940 Kidney transplant status: Secondary | ICD-10-CM | POA: Diagnosis not present

## 2022-10-23 DIAGNOSIS — Z79899 Other long term (current) drug therapy: Secondary | ICD-10-CM | POA: Diagnosis not present

## 2022-10-23 DIAGNOSIS — Z792 Long term (current) use of antibiotics: Secondary | ICD-10-CM | POA: Diagnosis not present

## 2022-10-23 DIAGNOSIS — I959 Hypotension, unspecified: Secondary | ICD-10-CM | POA: Diagnosis not present

## 2022-10-23 DIAGNOSIS — N2581 Secondary hyperparathyroidism of renal origin: Secondary | ICD-10-CM | POA: Diagnosis not present

## 2022-10-27 DIAGNOSIS — E039 Hypothyroidism, unspecified: Secondary | ICD-10-CM | POA: Diagnosis not present

## 2022-10-27 DIAGNOSIS — Z9181 History of falling: Secondary | ICD-10-CM | POA: Diagnosis not present

## 2022-10-27 DIAGNOSIS — F3341 Major depressive disorder, recurrent, in partial remission: Secondary | ICD-10-CM | POA: Diagnosis not present

## 2022-10-27 DIAGNOSIS — G63 Polyneuropathy in diseases classified elsewhere: Secondary | ICD-10-CM | POA: Diagnosis not present

## 2022-10-27 DIAGNOSIS — Z940 Kidney transplant status: Secondary | ICD-10-CM | POA: Diagnosis not present

## 2022-10-27 DIAGNOSIS — N2581 Secondary hyperparathyroidism of renal origin: Secondary | ICD-10-CM | POA: Diagnosis not present

## 2022-10-27 DIAGNOSIS — I776 Arteritis, unspecified: Secondary | ICD-10-CM | POA: Diagnosis not present

## 2022-10-30 ENCOUNTER — Other Ambulatory Visit: Payer: Self-pay | Admitting: Internal Medicine

## 2022-10-30 DIAGNOSIS — Z940 Kidney transplant status: Secondary | ICD-10-CM | POA: Diagnosis not present

## 2022-10-30 DIAGNOSIS — Z79899 Other long term (current) drug therapy: Secondary | ICD-10-CM | POA: Diagnosis not present

## 2022-10-30 DIAGNOSIS — R799 Abnormal finding of blood chemistry, unspecified: Secondary | ICD-10-CM | POA: Diagnosis not present

## 2022-10-30 DIAGNOSIS — Z1231 Encounter for screening mammogram for malignant neoplasm of breast: Secondary | ICD-10-CM

## 2022-11-03 ENCOUNTER — Ambulatory Visit: Payer: Medicare PPO

## 2022-11-06 DIAGNOSIS — Z79899 Other long term (current) drug therapy: Secondary | ICD-10-CM | POA: Diagnosis not present

## 2022-11-06 DIAGNOSIS — Z940 Kidney transplant status: Secondary | ICD-10-CM | POA: Diagnosis not present

## 2022-11-06 DIAGNOSIS — R799 Abnormal finding of blood chemistry, unspecified: Secondary | ICD-10-CM | POA: Diagnosis not present

## 2022-11-13 DIAGNOSIS — Z79899 Other long term (current) drug therapy: Secondary | ICD-10-CM | POA: Diagnosis not present

## 2022-11-13 DIAGNOSIS — R799 Abnormal finding of blood chemistry, unspecified: Secondary | ICD-10-CM | POA: Diagnosis not present

## 2022-11-13 DIAGNOSIS — Z940 Kidney transplant status: Secondary | ICD-10-CM | POA: Diagnosis not present

## 2022-11-20 DIAGNOSIS — R799 Abnormal finding of blood chemistry, unspecified: Secondary | ICD-10-CM | POA: Diagnosis not present

## 2022-11-20 DIAGNOSIS — Z940 Kidney transplant status: Secondary | ICD-10-CM | POA: Diagnosis not present

## 2022-11-20 DIAGNOSIS — Z79899 Other long term (current) drug therapy: Secondary | ICD-10-CM | POA: Diagnosis not present

## 2022-11-27 DIAGNOSIS — Z79899 Other long term (current) drug therapy: Secondary | ICD-10-CM | POA: Diagnosis not present

## 2022-11-27 DIAGNOSIS — Z940 Kidney transplant status: Secondary | ICD-10-CM | POA: Diagnosis not present

## 2022-11-27 DIAGNOSIS — R799 Abnormal finding of blood chemistry, unspecified: Secondary | ICD-10-CM | POA: Diagnosis not present

## 2022-12-04 DIAGNOSIS — Z792 Long term (current) use of antibiotics: Secondary | ICD-10-CM | POA: Diagnosis not present

## 2022-12-04 DIAGNOSIS — I959 Hypotension, unspecified: Secondary | ICD-10-CM | POA: Diagnosis not present

## 2022-12-04 DIAGNOSIS — Z949 Transplanted organ and tissue status, unspecified: Secondary | ICD-10-CM | POA: Diagnosis not present

## 2022-12-04 DIAGNOSIS — Z940 Kidney transplant status: Secondary | ICD-10-CM | POA: Diagnosis not present

## 2022-12-11 DIAGNOSIS — Z79899 Other long term (current) drug therapy: Secondary | ICD-10-CM | POA: Diagnosis not present

## 2022-12-11 DIAGNOSIS — R799 Abnormal finding of blood chemistry, unspecified: Secondary | ICD-10-CM | POA: Diagnosis not present

## 2022-12-11 DIAGNOSIS — Z940 Kidney transplant status: Secondary | ICD-10-CM | POA: Diagnosis not present

## 2022-12-11 DIAGNOSIS — Z949 Transplanted organ and tissue status, unspecified: Secondary | ICD-10-CM | POA: Diagnosis not present

## 2022-12-15 ENCOUNTER — Ambulatory Visit
Admission: RE | Admit: 2022-12-15 | Discharge: 2022-12-15 | Disposition: A | Discharge status: Home / Self Care | Payer: Medicare PPO | Source: Ambulatory Visit

## 2022-12-15 DIAGNOSIS — Z1231 Encounter for screening mammogram for malignant neoplasm of breast: Secondary | ICD-10-CM

## 2022-12-18 DIAGNOSIS — Z940 Kidney transplant status: Secondary | ICD-10-CM | POA: Diagnosis not present

## 2022-12-18 DIAGNOSIS — Z7952 Long term (current) use of systemic steroids: Secondary | ICD-10-CM | POA: Diagnosis not present

## 2022-12-18 DIAGNOSIS — Z79624 Long term (current) use of inhibitors of nucleotide synthesis: Secondary | ICD-10-CM | POA: Diagnosis not present

## 2022-12-18 DIAGNOSIS — J849 Interstitial pulmonary disease, unspecified: Secondary | ICD-10-CM | POA: Diagnosis not present

## 2022-12-18 DIAGNOSIS — Z79621 Long term (current) use of calcineurin inhibitor: Secondary | ICD-10-CM | POA: Diagnosis not present

## 2022-12-25 DIAGNOSIS — Z940 Kidney transplant status: Secondary | ICD-10-CM | POA: Diagnosis not present

## 2022-12-25 DIAGNOSIS — R799 Abnormal finding of blood chemistry, unspecified: Secondary | ICD-10-CM | POA: Diagnosis not present

## 2022-12-25 DIAGNOSIS — Z79899 Other long term (current) drug therapy: Secondary | ICD-10-CM | POA: Diagnosis not present

## 2023-01-01 DIAGNOSIS — Z79899 Other long term (current) drug therapy: Secondary | ICD-10-CM | POA: Diagnosis not present

## 2023-01-01 DIAGNOSIS — Z940 Kidney transplant status: Secondary | ICD-10-CM | POA: Diagnosis not present

## 2023-01-01 DIAGNOSIS — R799 Abnormal finding of blood chemistry, unspecified: Secondary | ICD-10-CM | POA: Diagnosis not present

## 2023-01-05 ENCOUNTER — Encounter: Payer: Self-pay | Admitting: Cardiovascular Disease

## 2023-01-05 ENCOUNTER — Ambulatory Visit: Payer: Medicare PPO | Attending: Cardiovascular Disease | Admitting: Cardiovascular Disease

## 2023-01-05 VITALS — BP 110/58 | HR 78 | Ht 66.0 in | Wt 147.4 lb

## 2023-01-05 DIAGNOSIS — N186 End stage renal disease: Secondary | ICD-10-CM | POA: Diagnosis not present

## 2023-01-05 DIAGNOSIS — I340 Nonrheumatic mitral (valve) insufficiency: Secondary | ICD-10-CM

## 2023-01-05 DIAGNOSIS — I5022 Chronic systolic (congestive) heart failure: Secondary | ICD-10-CM

## 2023-01-05 NOTE — Progress Notes (Signed)
Cardiology Office Note:    Date:  01/05/2023   ID:  FRANCISCA LANGENDERFER, DOB 05/11/1946, MRN [id number]  PCP:  Lorenda Ishihara, MD   [facility] Providers Cardiologist:  Tonny Bollman, MD     Referring MD: Lorenda Ishihara,*   Chief Complaint  Patient presents with   Mitral Regurgitation    History of Present Illness:    DELBERT VU is a 77 y.o. female with a hx of nonischemic cardiomyopathy with chronic heart failure with reduced ejection fraction.  She has had moderate mitral regurgitation as well.  The patient developed end-stage renal disease secondary to ANCA vasculitis.  She had a really difficult time on dialysis and ultimately underwent kidney transplantation in [place] about 5 months ago.  She has done very well with this.  Her creatinine is normal.  She complains of mild exertional dyspnea, but has no other cardiac-related symptoms.  She denies orthopnea, PND, or leg edema.  She has had no chest pain or pressure.  She is physically active with minimal limitation.  Prior to kidney transplantation, her LVEF had improved and her mitral regurgitation was only mild to moderate.  Past Medical History:  Diagnosis Date   Arthritis    Bruises easily    Cancer    basal skin cancer   Chronic back pain    HNP/stenosis and radiculopathy   Hyperlipidemia    takes Pravastatin daily   Hypertension    takes Hyzaar and toprol daily   Hypothyroidism    takes Synthroid daily   Insomnia    takes Elevil nightly   Joint pain    Joint swelling    Low BP    past some sedation   Nocturia    PONV (postoperative nausea and vomiting)    with knee replacement b/p dropped   Sinusitis    finished zpak yesterday   Urinary frequency     Past Surgical History:  Procedure Laterality Date   A/V FISTULAGRAM N/A 05/17/2020   Procedure: A/V FISTULAGRAM - Left Arm;  Surgeon: Cephus Shelling, MD;  Location: [facility];  Service: Cardiovascular;  Laterality:  N/A;   ABDOMINAL HYSTERECTOMY     AV FISTULA PLACEMENT Left 04/02/2020   Procedure: ARTERIOVENOUS (AV) FISTULA CREATION;  Surgeon: Larina Earthly, MD;  Location: [facility] OR;  Service: Vascular;  Laterality: Left;   BACK SURGERY     basal cell skin cancer     on face and forehead   bil knee replacements     BREAST BIOPSY     left breast/benign   COLONOSCOPY     IR FLUORO GUIDE CV LINE RIGHT  03/20/2020   IR US GUIDE VASC ACCESS RIGHT  03/20/2020   LUMBAR LAMINECTOMY/DECOMPRESSION MICRODISCECTOMY  08/13/2012   Procedure: LUMBAR LAMINECTOMY/DECOMPRESSION MICRODISCECTOMY 1 LEVEL;  Surgeon: Maeola Harman, MD;  Location: [facility]UR[facility];  Service: Neurosurgery;  Laterality: Left;  Left lumbar one-two Microdiskectomy   PERIPHERAL VASCULAR BALLOON ANGIOPLASTY Left 05/17/2020   Procedure: PERIPHERAL VASCULAR BALLOON ANGIOPLASTY;  Surgeon: Cephus Shelling, MD;  Location: [facility];  Service: Cardiovascular;  Laterality: Left;  Arm fistula    RIGHT/LEFT HEART CATH AND CORONARY ANGIOGRAPHY N/A 01/17/2021   Procedure: RIGHT/LEFT HEART CATH AND CORONARY ANGIOGRAPHY;  Surgeon: Runell Gess, MD;  Location: [facility];  Service: Cardiovascular;  Laterality: N/A;    Current Medications: Current Meds  Medication Sig   aspirin EC 81 MG tablet Take by mouth.   b complex-vitamin  c-folic acid (NEPHRO-VITE) 0.8 MG TABS tablet Take by mouth.   escitalopram (LEXAPRO) 20 MG tablet Take 20 mg by mouth daily.   loratadine (CLARITIN) 10 MG tablet Take 10 mg by mouth daily.   mycophenolate (CELLCEPT) 500 MG tablet Take 1,000 mg by mouth 2 (two) times daily.   predniSONE (DELTASONE) 20 MG tablet Take 20 mg by mouth daily with breakfast. Per patient taking 5 mg daily   sulfamethoxazole-trimethoprim (BACTRIM) 400-80 MG tablet Take 1 tablet by mouth daily.   SYNTHROID 88 MCG tablet TAKE 1 TABLET BY MOUTH ONCE DAILY BEFORE BREAKFAST   tacrolimus (PROGRAF) 1 MG capsule Take 2 mg by mouth 2 (two) times daily.      Allergies:   Hydrochlorothiazide, Other, Percocet [oxycodone-acetaminophen], and Tramadol   Social History   Socioeconomic History   Marital status: Married    Spouse name: Ray   Number of children: 2   Years of education: Not on file   Highest education level: Not on file  Occupational History   Occupation: retired  Tobacco Use   Smoking status: Never   Smokeless tobacco: Never  Vaping Use   Vaping Use: Never used  Substance and Sexual Activity   Alcohol use: No    Alcohol/week: 0.0 standard drinks of alcohol   Drug use: No   Sexual activity: Not Currently  Other Topics Concern   Not on file  Social History Narrative   Not on file   Social Determinants of Health   Financial Resource Strain: Not on file  Food Insecurity: No Food Insecurity (07/03/2022)   Hunger Vital Sign    Worried About Running Out of Food in the Last Year: Never true    Ran Out of Food in the Last Year: Never true  Transportation Needs: No Transportation Needs (07/03/2022)   PRAPARE - Administrator, Civil Service (Medical): No    Lack of Transportation (Non-Medical): No  Physical Activity: Not on file  Stress: Not on file  Social Connections: Not on file     Family History: The patient's family history includes Alcohol abuse in her father; Arthritis in her brother and brother; Cancer (age of onset: 59) in her brother; Heart disease in her father and mother; Hypertension in her mother; Stroke in her father. There is no history of Colon cancer, Colon polyps, Stomach cancer, or Rectal cancer.  ROS:   Please see the history of present illness.    All other systems reviewed and are negative.  EKGs/Labs/Other Studies Reviewed:    The following studies were reviewed today: Cardiac Studies & Procedures   CARDIAC CATHETERIZATION  CARDIAC CATHETERIZATION 01/17/2021  Narrative Images from the original result were not included. VIVECA BECKSTROM is a 77 y.o.  female   [id number] LOCATION:  FACILITY: [facility] PHYSICIAN: Nanetta Batty, M.D. 1946/06/11   DATE OF PROCEDURE:  01/17/2021  DATE OF DISCHARGE:     CARDIAC CATHETERIZATION    History obtained from chart review.Ms. Comes is a 77 year old woman admitted with new onset congestive heart failure.  She has an interesting history.  She reports that she was very healthy until 2020 when she developed COVID-19 pneumonia.  She used to walk 5 to 6 miles every day and work out at [employer].  She developed multiple problems including ANCA positive interstitial lung disease and end-stage renal disease.  She reports that she has been on hemodialysis now for approximately 6 months.  The patient describes progressive fatigue.  She has developed shortness of  breath at rest, orthopnea, and PND over the last 4 to 6 weeks.  She frequently has to sit up on the bedside at night and lean forward in order to breathe well.  She feels a little bit better since undergoing back-to-back dialysis the past few days.  She had developed ankle swelling and this is now resolved.  She continues to experience orthopnea and PND with slight improvement.  She has felt a pressure in her chest at times, but no exertional angina.  The patient is a lifelong non-smoker.  During her hospitalization, she is found to have markedly elevated BNP.  She has small bilateral pleural effusions with mild interstitial edema.  An echocardiogram was performed today and demonstrates new LV dysfunction with an LVEF of 30 to 35%, moderately severe mitral regurgitation, and moderate aortic insufficiency.  She was referred for right left heart cath to define her anatomy and physiology.  Impression Ms. Coiro has essentially normal coronary arteries and normal filling pressures.  She appears to be euvolemic.  She did not appear to have a large V wave although by echo she has severe mitral regurgitation.  Guideline directed optimal medical therapy will be  recommended.  She may be a candidate for MitraClip which Dr. Excell Seltzer will address.  The right common femoral vein had Mynx closure successfully.  The arterial sheath will be pulled manually given the fact that there is very little subcutaneous tissue.  The patient left lab in stable condition.  Nanetta Batty. MD, [facility] 01/17/2021 11:26 AM  Findings Coronary Findings Diagnostic  Dominance: Right  No diagnostic findings have been documented. Intervention  No interventions have been documented.     ECHOCARDIOGRAM  ECHOCARDIOGRAM COMPLETE 07/22/2022  Narrative ECHOCARDIOGRAM REPORT    Patient Name:   ZUHA GRIECO Date of Exam: 07/22/2022 Medical Rec #:  [id number]       Height:       66.0 in Accession #:    [id number]      Weight:       141.0 lb Date of Birth:  March 13, 1946       BSA:          1.724 m Patient Age:    76 years        BP:           124/66 mmHg Patient Gender: F               HR:           76 bpm. Exam Location:  [address]  Procedure: 2D Echo, Cardiac Doppler, Color Doppler and 3D Echo  Indications:    MV Disorder I05.9  History:        Patient has prior history of Echocardiogram examinations, most recent 06/13/2021. CHF; Risk Factors:Hypertension and Dyslipidemia.  Sonographer:    Thurman Coyer RDCS Referring Phys: [id number] SWINYER  IMPRESSIONS   1. Left ventricular ejection fraction, by estimation, is 45 to 50%. Left ventricular ejection fraction by 2D MOD biplane is 49.6 %. The left ventricle has mildly decreased function. The left ventricle has no regional wall motion abnormalities. There is mild left ventricular hypertrophy. Left ventricular diastolic parameters are consistent with Grade I diastolic dysfunction (impaired relaxation). 2. Right ventricular systolic function is normal. The right ventricular size is normal. Tricuspid regurgitation signal is inadequate for assessing PA pressure. 3. The mitral valve is abnormal. Moderate mitral  valve regurgitation. 4. The aortic valve is tricuspid. Aortic valve regurgitation is moderate. Aortic valve sclerosis  is present, with no evidence of aortic valve stenosis. Aortic regurgitation PHT measures 341 msec. 5. Aortic dilatation noted. There is mild dilatation of the ascending aorta, measuring 40 mm. 6. The inferior vena cava is normal in size with greater than 50% respiratory variability, suggesting right atrial pressure of 3 mmHg.  Comparison(s): Changes from prior study are noted. 06/13/2021: LVEF 40-45%, mild LAD, moderate MR, moderate AI.  FINDINGS Left Ventricle: Left ventricular ejection fraction, by estimation, is 45 to 50%. Left ventricular ejection fraction by 2D MOD biplane is 49.6 %. The left ventricle has mildly decreased function. The left ventricle has no regional wall motion abnormalities. The left ventricular internal cavity size was normal in size. There is mild left ventricular hypertrophy. Left ventricular diastolic parameters are consistent with Grade I diastolic dysfunction (impaired relaxation). Indeterminate filling pressures.  Right Ventricle: The right ventricular size is normal. No increase in right ventricular wall thickness. Right ventricular systolic function is normal. Tricuspid regurgitation signal is inadequate for assessing PA pressure.  Left Atrium: Left atrial size was normal in size.  Right Atrium: Right atrial size was normal in size.  Pericardium: There is no evidence of pericardial effusion.  Mitral Valve: The mitral valve is abnormal. Mild to moderate mitral annular calcification. Moderate mitral valve regurgitation, with centrally-directed jet.  Tricuspid Valve: The tricuspid valve is grossly normal. Tricuspid valve regurgitation is trivial.  Aortic Valve: The aortic valve is tricuspid. Aortic valve regurgitation is moderate. Aortic regurgitation PHT measures 341 msec. Aortic valve sclerosis is present, with no evidence of aortic valve  stenosis.  Pulmonic Valve: The pulmonic valve was grossly normal. Pulmonic valve regurgitation is trivial.  Aorta: Aortic dilatation noted. There is mild dilatation of the ascending aorta, measuring 40 mm.  Venous: The inferior vena cava is normal in size with greater than 50% respiratory variability, suggesting right atrial pressure of 3 mmHg.  IAS/Shunts: No atrial level shunt detected by color flow Doppler.   LEFT VENTRICLE PLAX 2D                        Biplane EF (MOD) LVIDd:         5.00 cm         LV Biplane EF:   Left LVIDs:         4.00 cm                          ventricular LV PW:         1.00 cm                          ejection LV IVS:        1.10 cm                          fraction by LVOT diam:     2.00 cm                          2D MOD LV SV:         84                               biplane is LV SV Index:   48  49.6 %. LVOT Area:     3.14 cm  LV Volumes (MOD) LV vol d, MOD    89.7 ml A2C: LV vol d, MOD    64.9 ml A4C: LV vol s, MOD    44.5 ml A2C: LV vol s, MOD    33.1 ml A4C: LV SV MOD A2C:   45.2 ml LV SV MOD A4C:   64.9 ml LV SV MOD BP:    38.8 ml  RIGHT VENTRICLE RV Basal diam:  3.20 cm RV Mid diam:    2.50 cm RV S prime:     11.40 cm/s TAPSE (M-mode): 2.2 cm  LEFT ATRIUM             Index        RIGHT ATRIUM          Index LA diam:        3.50 cm 2.03 cm/m   RA Area:     9.73 cm LA Vol (A2C):   54.5 ml 31.62 ml/m  RA Volume:   20.60 ml 11.95 ml/m LA Vol (A4C):   40.0 ml 23.18 ml/m LA Biplane Vol: 44.6 ml 25.87 ml/m AORTIC VALVE LVOT Vmax:         136.00 cm/s LVOT Vmean:        89.100 cm/s LVOT VTI:          0.266 m AI PHT:            341 msec AR Vena Contracta: 0.40 cm  AORTA Ao Root diam: 3.20 cm Ao Asc diam:  4.00 cm  MITRAL VALVE MV Area (PHT): 4.21 cm         SHUNTS MV Decel Time: 180 msec         Systemic VTI:  0.27 m MR Peak grad:      129.0 mmHg   Systemic Diam: 2.00 cm MR Mean grad:       82.0 mmHg MR Vmax:           568.00 cm/s MR Vmean:          425.0 cm/s MR Vena Contracta: 0.30 cm MR PISA:           4.02 cm MR PISA Eff ROA:   26 mm MR PISA Radius:    0.80 cm MV E velocity: 79.00 cm/s MV A velocity: 133.00 cm/s MV E/A ratio:  0.59  Zoila Shutter MD Electronically signed by Zoila Shutter MD Signature Date/Time: 07/22/2022/2:03:37 PM    Final              EKG:  EKG is ordered today.  The ekg ordered today demonstrates normal sinus rhythm 78 bpm, left atrial enlargement, left axis deviation, criteria for LVH.  Recent Labs: No results found for requested labs within last 365 days.  Recent Lipid Panel    Component Value Date/Time   CHOL 130 01/17/2021 1020   CHOL 173 03/01/2020 0927   CHOL 169 12/21/2012 1552   TRIG 84 01/17/2021 1020   TRIG 97 12/26/2014 1150   TRIG 64 12/21/2012 1552   HDL 60 01/17/2021 1020   HDL 67 03/01/2020 0927   HDL 75 12/26/2014 1150   HDL 63 12/21/2012 1552   CHOLHDL 2.2 01/17/2021 1020   VLDL 17 01/17/2021 1020   LDLCALC 53 01/17/2021 1020   LDLCALC 94 03/01/2020 0927   LDLCALC 102 (H) 07/06/2014 0912   LDLCALC 93 12/21/2012 1552     Risk Assessment/Calculations:  Physical Exam:    VS:  BP (!) 110/58   Pulse 78   Ht 5\' 6"  (1.676 m)   Wt 147 lb 6.4 oz (66.9 kg)   SpO2 95%   BMI 23.79 kg/m     Wt Readings from Last 3 Encounters:  01/05/23 147 lb 6.4 oz (66.9 kg)  09/30/22 141 lb (64 kg)  07/22/22 141 lb (64 kg)     GEN:  Well nourished, well developed in no acute distress HEENT: Normal NECK: No JVD; No carotid bruits LYMPHATICS: No lymphadenopathy CARDIAC: RRR, no murmurs, rubs, gallops RESPIRATORY:  Clear to auscultation without rales, wheezing or rhonchi  ABDOMEN: Soft, non-tender, non-distended MUSCULOSKELETAL:  No edema; No deformity  SKIN: Warm and dry NEUROLOGIC:  Alert and oriented x 3 PSYCHIATRIC:  Normal affect   ASSESSMENT:    1. Nonrheumatic mitral valve  regurgitation   2. Chronic HFrEF (heart failure with reduced ejection fraction)   3. ESRD (end stage renal disease)    PLAN:    In order of problems listed above:  Mitral regurgitation improved with improvement in her LV function.  The patient has NYHA functional class II limitation from exertional dyspnea.  At most recent echo assessment in her mitral regurgitation was moderate.  Recommend follow-up echocardiogram in 1 year. Because of low blood pressure, she has been unable to tolerate therapy for cardiomyopathy.  Will follow-up an echocardiogram next year.  No other changes made. Now status post renal transplantation.  Followed closely by the transplant team in [place].  Also follows locally with Dr. Arrie Aran.      Medication Adjustments/Labs and Tests Ordered: Current medicines are reviewed at length with the patient today.  Concerns regarding medicines are outlined above.  Orders Placed This Encounter  Procedures   EKG 12-Lead   ECHOCARDIOGRAM COMPLETE   No orders of the defined types were placed in this encounter.   Patient Instructions  Medication Instructions:  Your physician recommends that you continue on your current medications as directed. Please refer to the Current Medication list given to you today.  *If you need a refill on your cardiac medications before your next appointment, please call your pharmacy*   Lab Work: NONE If you have labs (blood work) drawn today and your tests are completely normal, you will receive your results only by: MyChart Message (if you have MyChart) OR A paper copy in the mail If you have any lab test that is abnormal or we need to change your treatment, we will call you to review the results.   Testing/Procedures: ECHO (prior to next visit) Your physician has requested that you have an echocardiogram. Echocardiography is a painless test that uses sound waves to create images of your heart. It provides your doctor with  information about the size and shape of your heart and how well your heart's chambers and valves are working. This procedure takes approximately one hour. There are no restrictions for this procedure. Please do NOT wear cologne, perfume, aftershave, or lotions (deodorant is allowed). Please arrive 15 minutes prior to your appointment time.  Follow-Up: At [facility], you and your health needs are our priority.  As part of our continuing mission to provide you with exceptional heart care, we have created designated Provider Care Teams.  These Care Teams include your primary Cardiologist (physician) and Advanced Practice Providers (APPs -  Physician Assistants and Nurse Practitioners) who all work together to provide you with the care you need, when you need it.  Your next appointment:   1 year(s)  Provider:   Tonny Bollman, MD        Signed, Tonny Bollman, MD  01/05/2023 10:37 AM    [facility]

## 2023-01-05 NOTE — Patient Instructions (Signed)
Medication Instructions:  Your physician recommends that you continue on your current medications as directed. Please refer to the Current Medication list given to you today.  *If you need a refill on your cardiac medications before your next appointment, please call your pharmacy*   Lab Work: NONE If you have labs (blood work) drawn today and your tests are completely normal, you will receive your results only by: MyChart Message (if you have MyChart) OR A paper copy in the mail If you have any lab test that is abnormal or we need to change your treatment, we will call you to review the results.   Testing/Procedures: ECHO (prior to next visit) Your physician has requested that you have an echocardiogram. Echocardiography is a painless test that uses sound waves to create images of your heart. It provides your doctor with information about the size and shape of your heart and how well your heart's chambers and valves are working. This procedure takes approximately one hour. There are no restrictions for this procedure. Please do NOT wear cologne, perfume, aftershave, or lotions (deodorant is allowed). Please arrive 15 minutes prior to your appointment time.  Follow-Up: At [facility], you and your health needs are our priority.  As part of our continuing mission to provide you with exceptional heart care, we have created designated Provider Care Teams.  These Care Teams include your primary Cardiologist (physician) and Advanced Practice Providers (APPs -  Physician Assistants and Nurse Practitioners) who all work together to provide you with the care you need, when you need it.  Your next appointment:   1 year(s)  Provider:   Tonny Bollman, MD

## 2023-01-08 DIAGNOSIS — Z940 Kidney transplant status: Secondary | ICD-10-CM | POA: Diagnosis not present

## 2023-01-08 DIAGNOSIS — Z79899 Other long term (current) drug therapy: Secondary | ICD-10-CM | POA: Diagnosis not present

## 2023-01-08 DIAGNOSIS — R799 Abnormal finding of blood chemistry, unspecified: Secondary | ICD-10-CM | POA: Diagnosis not present

## 2023-01-15 DIAGNOSIS — Z792 Long term (current) use of antibiotics: Secondary | ICD-10-CM | POA: Diagnosis not present

## 2023-01-15 DIAGNOSIS — Z949 Transplanted organ and tissue status, unspecified: Secondary | ICD-10-CM | POA: Diagnosis not present

## 2023-01-15 DIAGNOSIS — Z940 Kidney transplant status: Secondary | ICD-10-CM | POA: Diagnosis not present

## 2023-01-15 DIAGNOSIS — N2581 Secondary hyperparathyroidism of renal origin: Secondary | ICD-10-CM | POA: Diagnosis not present

## 2023-01-29 DIAGNOSIS — Z79899 Other long term (current) drug therapy: Secondary | ICD-10-CM | POA: Diagnosis not present

## 2023-01-29 DIAGNOSIS — Z940 Kidney transplant status: Secondary | ICD-10-CM | POA: Diagnosis not present

## 2023-01-29 DIAGNOSIS — R799 Abnormal finding of blood chemistry, unspecified: Secondary | ICD-10-CM | POA: Diagnosis not present

## 2023-02-12 DIAGNOSIS — Z79899 Other long term (current) drug therapy: Secondary | ICD-10-CM | POA: Diagnosis not present

## 2023-02-12 DIAGNOSIS — Z940 Kidney transplant status: Secondary | ICD-10-CM | POA: Diagnosis not present

## 2023-02-24 ENCOUNTER — Telehealth: Payer: Self-pay

## 2023-02-24 NOTE — Telephone Encounter (Signed)
Lexi with [facility] called stating that the pt had a kidney transplant and needs an evaluation for HD access reversal.  Lexi called again, two identifiers used. Gave her the fax number to send over the referral and then pt can be scheduled. Confirmed understanding.

## 2023-02-26 DIAGNOSIS — Z79899 Other long term (current) drug therapy: Secondary | ICD-10-CM | POA: Diagnosis not present

## 2023-02-26 DIAGNOSIS — Z792 Long term (current) use of antibiotics: Secondary | ICD-10-CM | POA: Diagnosis not present

## 2023-02-26 DIAGNOSIS — Z940 Kidney transplant status: Secondary | ICD-10-CM | POA: Diagnosis not present

## 2023-02-26 DIAGNOSIS — I959 Hypotension, unspecified: Secondary | ICD-10-CM | POA: Diagnosis not present

## 2023-02-26 DIAGNOSIS — Z949 Transplanted organ and tissue status, unspecified: Secondary | ICD-10-CM | POA: Diagnosis not present

## 2023-02-28 DIAGNOSIS — Y9301 Activity, walking, marching and hiking: Secondary | ICD-10-CM | POA: Diagnosis not present

## 2023-02-28 DIAGNOSIS — Y92511 Restaurant or cafe as the place of occurrence of the external cause: Secondary | ICD-10-CM | POA: Diagnosis not present

## 2023-02-28 DIAGNOSIS — E039 Hypothyroidism, unspecified: Secondary | ICD-10-CM | POA: Diagnosis not present

## 2023-02-28 DIAGNOSIS — S42351A Displaced comminuted fracture of shaft of humerus, right arm, initial encounter for closed fracture: Secondary | ICD-10-CM | POA: Diagnosis not present

## 2023-02-28 DIAGNOSIS — W108XXA Fall (on) (from) other stairs and steps, initial encounter: Secondary | ICD-10-CM | POA: Diagnosis not present

## 2023-02-28 DIAGNOSIS — S022XXA Fracture of nasal bones, initial encounter for closed fracture: Secondary | ICD-10-CM | POA: Diagnosis not present

## 2023-02-28 DIAGNOSIS — Z79890 Hormone replacement therapy (postmenopausal): Secondary | ICD-10-CM | POA: Diagnosis not present

## 2023-02-28 DIAGNOSIS — Y998 Other external cause status: Secondary | ICD-10-CM | POA: Diagnosis not present

## 2023-03-02 ENCOUNTER — Other Ambulatory Visit: Payer: Self-pay

## 2023-03-02 ENCOUNTER — Encounter (HOSPITAL_COMMUNITY): Payer: Self-pay | Admitting: Orthopedic Surgery

## 2023-03-02 DIAGNOSIS — S42301A Unspecified fracture of shaft of humerus, right arm, initial encounter for closed fracture: Secondary | ICD-10-CM | POA: Diagnosis not present

## 2023-03-02 DIAGNOSIS — Z940 Kidney transplant status: Secondary | ICD-10-CM | POA: Diagnosis not present

## 2023-03-02 NOTE — Pre-Procedure Instructions (Signed)
PCP - Lorenda Ishihara, MD Cardiologist - Tonny Bollman, MD  EKG - 01/05/23 ECHO - 07/22/22 Cardiac Cath - 01/17/21  Aspirin Instructions: Last dose 03/02/23  Anesthesia review: Y  Patient verbally denies any shortness of breath, fever, cough and chest pain during phone call   -------------  SDW INSTRUCTIONS given:  Your procedure is scheduled on Tuesday, June 11th.  Report to [facility]tr[facility] "A" at [facility] , and check in at the Admitting office.  Call this number if you have problems the morning of surgery:  [phone]   Remember:  Do not eat after midnight the night before your surgery  You may drink clear liquids until 1130 the morning of your surgery.   Clear liquids allowed are: Water, Non-Citrus Juices (without pulp), Carbonated Beverages, Clear Tea, Black Coffee Only, and Gatorade    Take these medicines the morning of surgery with A SIP OF WATER  cephALEXin (KEFLEX)  loratadine (CLARITIN)  mycophenolate (CELLCEPT)  predniSONE (DELTASONE) SYNTHROID  tacrolimus (PROGRAF)  (BACTRIM)  HYDROcodone-acetaminophen (NORCO/VICODIN)-if needed methocarbamol (ROBAXIN)-if needed albuterol (VENTOLIN HFA)-if needed ZOFRAN-if needed  As of today, STOP taking any Aspirin (unless otherwise instructed by your surgeon) Aleve, Naproxen, Ibuprofen, Motrin, Advil, Goody's, BC's, all herbal medications, fish oil, and all vitamins.                      Do not wear jewelry, make up, or nail polish            Do not wear lotions, powders, perfumes/colognes, or deodorant.            Do not shave 48 hours prior to surgery.  Men may shave face and neck.            Do not bring valuables to the hospital.            [facility] is not responsible for any belongings or valuables.  Do NOT Smoke (Tobacco/Vaping) 24 hours prior to your procedure If you use a CPAP at night, you may bring all equipment for your overnight stay.   Contacts, glasses, dentures or bridgework may not  be worn into surgery.      For patients admitted to the hospital, discharge time will be determined by your treatment team.   Patients discharged the day of surgery will not be allowed to drive home, and someone needs to stay with them for 24 hours.    Special instructions:   [facility]- Preparing For Surgery  Before surgery, you can play an important role. Because skin is not sterile, your skin needs to be as free of germs as possible. You can reduce the number of germs on your skin by washing with CHG (chlorahexidine gluconate) Soap before surgery.  CHG is an antiseptic cleaner which kills germs and bonds with the skin to continue killing germs even after washing.    Oral Hygiene is also important to reduce your risk of infection.  Remember - BRUSH YOUR TEETH THE MORNING OF SURGERY WITH YOUR REGULAR TOOTHPASTE  Please do not use if you have an allergy to CHG or antibacterial soaps. If your skin becomes reddened/irritated stop using the CHG.  Do not shave (including legs and underarms) for at least 48 hours prior to first CHG shower. It is OK to shave your face.  Please follow these instructions carefully.   Shower the NIGHT BEFORE SURGERY and the MORNING OF SURGERY with DIAL Soap.   Pat yourself dry with a CLEAN  TOWEL.  Wear CLEAN PAJAMAS to bed the night before surgery  Place CLEAN SHEETS on your bed the night of your first shower and DO NOT SLEEP WITH PETS.   Day of Surgery: Please shower morning of surgery  Wear Clean/Comfortable clothing the morning of surgery Do not apply any deodorants/lotions.   Remember to brush your teeth WITH YOUR REGULAR TOOTHPASTE.   Questions were answered. Patient verbalized understanding of instructions.

## 2023-03-02 NOTE — Progress Notes (Signed)
Anesthesia Chart Review: Same day workup  Follows with pulmonology at [facility] for history of ANCA associated vasculitis and interstitial lung disease. Spiro/DLCO 04/2022: mild restriction, moderately reduced diffusion capacity. 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  She was last seen by Dr. Carita Pian on 12/18/2022 and doing well at that time from pulmonary standpoint.  It was noted that she underwent living donor kidney transplant November 2023 and has done well without infections or respiratory complications.  No changes made to management.  47-month follow-up recommended.  History of ESRD now s/p LD KT 07/2022 done in [place], [place].  She has done well.  Renal function has been normal.  Her last transplant follow-up 11/06/2022, "Most recent creatinine is 0.73 and never noted to be above 1.0 since discharge." Current immunosuppression regimen includes Prograf 2mg  BID, Cellcept 1000mg  BID, and prednisone 5mg  bid.  She also follows locally with nephrologist Dr. Arrie Aran.  Follows cardiology for history of nonischemic cardiomyopathy chronic heart failure with reduced ejection fraction.  She also has moderate aortic and mitral regurgitation.  Last seen by Dr. Excell Seltzer 01/05/2023 and noted to be stable at that time.  Per note, "Mitral regurgitation improved with improvement in her LV function.  The patient has NYHA functional class II limitation from exertional dyspnea.  At most recent echo assessment in her mitral regurgitation was moderate.  Recommend follow-up echocardiogram in 1 year."  Patient will need day of surgery labs and evaluation.  EKG 01/05/2023: Sinus rhythm. Rate 78.  Possible LAE.  LAD.  Criteria for LVH.  TTE 07/22/2022:  1. Left ventricular ejection fraction, by estimation, is 45 to 50%. Left  ventricular ejection fraction by 2D MOD biplane is 49.6 %. The left   ventricle has mildly decreased function. The left ventricle has no  regional wall motion abnormalities. There is  mild left ventricular hypertrophy. Left ventricular diastolic parameters  are consistent with Grade I diastolic dysfunction (impaired relaxation).   2. Right ventricular systolic function is normal. The right ventricular  size is normal. Tricuspid regurgitation signal is inadequate for assessing  PA pressure.   3. The mitral valve is abnormal. Moderate mitral valve regurgitation.   4. The aortic valve is tricuspid. Aortic valve regurgitation is moderate.  Aortic valve sclerosis is present, with no evidence of aortic valve  stenosis. Aortic regurgitation PHT measures 341 msec.   5. Aortic dilatation noted. There is mild dilatation of the ascending  aorta, measuring 40 mm.   6. The inferior vena cava is normal in size with greater than 50%  respiratory variability, suggesting right atrial pressure of 3 mmHg.   Comparison(s): Changes from prior study are noted. 06/13/2021: LVEF 40-45%,  mild LAD, moderate MR, moderate AI.     Zannie Cove [facility] Short Stay Center/Anesthesiology Phone [phone] 03/02/2023 2:39 PM

## 2023-03-02 NOTE — Anesthesia Preprocedure Evaluation (Signed)
Anesthesia Evaluation  Patient identified by MRN, date of birth, ID band Patient awake    Reviewed: Allergy & Precautions, NPO status , Patient's Chart, lab work & pertinent test results  History of Anesthesia Complications (+) PONV and history of anesthetic complications  Airway Mallampati: II  TM Distance: >3 FB Neck ROM: Full    Dental  (+) Teeth Intact, Dental Advisory Given   Pulmonary    breath sounds clear to auscultation       Cardiovascular hypertension, +CHF   Rhythm:Regular Rate:Normal     Neuro/Psych  PSYCHIATRIC DISORDERS  Depression       GI/Hepatic negative GI ROS, Neg liver ROS,,,  Endo/Other  Hypothyroidism    Renal/GU Renal disease     Musculoskeletal  (+) Arthritis ,    Abdominal   Peds  Hematology   Anesthesia Other Findings   Reproductive/Obstetrics                             Anesthesia Physical Anesthesia Plan  ASA: 2  Anesthesia Plan: General   Post-op Pain Management: Tylenol PO (pre-op)*   Induction: Intravenous  PONV Risk Score and Plan: 4 or greater and Ondansetron, Dexamethasone, Scopolamine patch - Pre-op and Midazolam  Airway Management Planned: Oral ETT  Additional Equipment: None  Intra-op Plan:   Post-operative Plan: Extubation in OR  Informed Consent: I have reviewed the patients History and Physical, chart, labs and discussed the procedure including the risks, benefits and alternatives for the proposed anesthesia with the patient or authorized representative who has indicated his/her understanding and acceptance.     Dental advisory given  Plan Discussed with: CRNA  Anesthesia Plan Comments: (PAT note by Antionette Poles, PA-C: Follows with pulmonology at [facility] for history of ANCA associated vasculitis and interstitial lung disease. Spiro/DLCO 04/2022: mild restriction, moderately reduced diffusion capacity. 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  She was last seen by Dr. Carita Pian on 12/18/2022 and doing well at that time from pulmonary standpoint.  It was noted that she underwent living donor kidney transplant November 2023 and has done well without infections or respiratory complications.  No changes made to management.  56-month follow-up recommended.  History of ESRD now s/p LD KT 07/2022 done in [place], [place].  She has done well.  Renal function has been normal.  Her last transplant follow-up 11/06/2022, "Most recent creatinine is 0.73 and never noted to be above 1.0 since discharge." Current immunosuppression regimen includes Prograf 2mg  BID, Cellcept 1000mg  BID, and prednisone 5mg  bid.  She also follows locally with nephrologist Dr. Arrie Aran.  Follows cardiology for history of nonischemic cardiomyopathy chronic heart failure with reduced ejection fraction.  She also has moderate aortic and mitral regurgitation.  Last seen by Dr. Excell Seltzer 01/05/2023 and noted to be stable at that time.  Per note, "Mitral regurgitation improved with improvement in her LV function.  The patient has NYHA functional class II limitation from exertional dyspnea.  At most recent echo assessment in her mitral regurgitation was moderate.  Recommend follow-up echocardiogram in 1 year."  Patient will need day of surgery labs and evaluation.  EKG 01/05/2023: Sinus rhythm. Rate 78.  Possible LAE.  LAD.  Criteria for LVH.  TTE 07/22/2022: 1. Left ventricular ejection fraction, by estimation, is 45 to 50%. Left  ventricular ejection fraction by 2D MOD biplane is 49.6 %. The left  ventricle has mildly decreased function. The left ventricle has no  regional wall motion abnormalities. There is  mild left ventricular hypertrophy. Left ventricular diastolic parameters  are consistent with Grade I diastolic dysfunction (impaired relaxation).   2. Right ventricular systolic function is normal. The right ventricular  size is normal. Tricuspid regurgitation signal is inadequate for assessing  PA pressure.  3. The mitral valve is abnormal. Moderate mitral valve regurgitation.  4. The aortic valve is tricuspid. Aortic valve regurgitation is moderate.  Aortic valve sclerosis is present, with no evidence of aortic valve  stenosis. Aortic regurgitation PHT measures 341 msec.  5. Aortic dilatation noted. There is mild dilatation of the ascending  aorta, measuring 40 mm.  6. The inferior vena cava is normal in size with greater than 50%  respiratory variability, suggesting right atrial pressure of 3 mmHg.   Comparison(s): Changes from prior study are noted. 06/13/2021: LVEF 40-45%,  mild LAD, moderate MR, moderate AI.    )        Anesthesia Quick Evaluation

## 2023-03-03 ENCOUNTER — Other Ambulatory Visit: Payer: Self-pay

## 2023-03-03 ENCOUNTER — Ambulatory Visit (HOSPITAL_COMMUNITY): Payer: Medicare PPO | Admitting: Physician Assistant

## 2023-03-03 ENCOUNTER — Encounter (HOSPITAL_COMMUNITY)
Admission: RE | Disposition: A | Discharge status: Home / Self Care | Payer: Self-pay | Source: Home / Self Care | Attending: Orthopedic Surgery

## 2023-03-03 ENCOUNTER — Ambulatory Visit (HOSPITAL_COMMUNITY)
Admission: RE | Admit: 2023-03-03 | Discharge: 2023-03-03 | Disposition: A | Discharge status: Home / Self Care | Payer: Medicare PPO | Attending: Orthopedic Surgery | Admitting: Orthopedic Surgery

## 2023-03-03 ENCOUNTER — Ambulatory Visit (HOSPITAL_COMMUNITY): Payer: Medicare PPO

## 2023-03-03 ENCOUNTER — Encounter (HOSPITAL_COMMUNITY): Payer: Self-pay | Admitting: Orthopedic Surgery

## 2023-03-03 ENCOUNTER — Ambulatory Visit (HOSPITAL_BASED_OUTPATIENT_CLINIC_OR_DEPARTMENT_OTHER): Payer: Medicare PPO | Admitting: Physician Assistant

## 2023-03-03 DIAGNOSIS — F32A Depression, unspecified: Secondary | ICD-10-CM | POA: Insufficient documentation

## 2023-03-03 DIAGNOSIS — Y939 Activity, unspecified: Secondary | ICD-10-CM | POA: Insufficient documentation

## 2023-03-03 DIAGNOSIS — Z7982 Long term (current) use of aspirin: Secondary | ICD-10-CM | POA: Diagnosis not present

## 2023-03-03 DIAGNOSIS — M1990 Unspecified osteoarthritis, unspecified site: Secondary | ICD-10-CM | POA: Insufficient documentation

## 2023-03-03 DIAGNOSIS — I509 Heart failure, unspecified: Secondary | ICD-10-CM | POA: Diagnosis not present

## 2023-03-03 DIAGNOSIS — S42351A Displaced comminuted fracture of shaft of humerus, right arm, initial encounter for closed fracture: Secondary | ICD-10-CM | POA: Insufficient documentation

## 2023-03-03 DIAGNOSIS — Z940 Kidney transplant status: Secondary | ICD-10-CM | POA: Diagnosis not present

## 2023-03-03 DIAGNOSIS — S42301A Unspecified fracture of shaft of humerus, right arm, initial encounter for closed fracture: Secondary | ICD-10-CM | POA: Diagnosis not present

## 2023-03-03 DIAGNOSIS — Z7952 Long term (current) use of systemic steroids: Secondary | ICD-10-CM | POA: Insufficient documentation

## 2023-03-03 DIAGNOSIS — E039 Hypothyroidism, unspecified: Secondary | ICD-10-CM | POA: Insufficient documentation

## 2023-03-03 DIAGNOSIS — I110 Hypertensive heart disease with heart failure: Secondary | ICD-10-CM

## 2023-03-03 DIAGNOSIS — Z79899 Other long term (current) drug therapy: Secondary | ICD-10-CM | POA: Insufficient documentation

## 2023-03-03 DIAGNOSIS — W19XXXA Unspecified fall, initial encounter: Secondary | ICD-10-CM | POA: Insufficient documentation

## 2023-03-03 HISTORY — DX: Personal history of diseases of the blood and blood-forming organs and certain disorders involving the immune mechanism: Z86.2

## 2023-03-03 HISTORY — PX: ORIF HUMERUS FRACTURE: SHX2126

## 2023-03-03 HISTORY — DX: Pneumonia, unspecified organism: J18.9

## 2023-03-03 LAB — CBC
HCT: 38.5 % (ref 36.0–46.0)
Hemoglobin: 12.1 g/dL (ref 12.0–15.0)
MCH: 30 pg (ref 26.0–34.0)
MCHC: 31.4 g/dL (ref 30.0–36.0)
MCV: 95.3 fL (ref 80.0–100.0)
Platelets: 155 10*3/uL (ref 150–400)
RBC: 4.04 MIL/uL (ref 3.87–5.11)
RDW: 13.2 % (ref 11.5–15.5)
WBC: 7 10*3/uL (ref 4.0–10.5)
nRBC: 0 % (ref 0.0–0.2)

## 2023-03-03 LAB — BASIC METABOLIC PANEL
Anion gap: 12 (ref 5–15)
BUN: 18 mg/dL (ref 8–23)
CO2: 25 mmol/L (ref 22–32)
Calcium: 9.8 mg/dL (ref 8.9–10.3)
Chloride: 98 mmol/L (ref 98–111)
Creatinine, Ser: 0.72 mg/dL (ref 0.44–1.00)
GFR, Estimated: >60 mL/min (ref >60)
Glucose, Bld: 141 mg/dL — ABNORMAL HIGH (ref 70–99)
Potassium: 4.2 mmol/L (ref 3.5–5.1)
Sodium: 135 mmol/L (ref 135–145)

## 2023-03-03 SURGERY — OPEN REDUCTION INTERNAL FIXATION (ORIF) DISTAL HUMERUS FRACTURE
Anesthesia: General | Site: Arm Upper | Laterality: Right

## 2023-03-03 MED ORDER — ONDANSETRON HCL 4 MG/2ML IJ SOLN
INTRAMUSCULAR | Status: AC
  Filled 2023-03-03: qty 2

## 2023-03-03 MED ORDER — LIDOCAINE 2% (20 MG/ML) 5 ML SYRINGE
INTRAMUSCULAR | Status: AC
  Filled 2023-03-03: qty 5

## 2023-03-03 MED ORDER — ACETAMINOPHEN 10 MG/ML IV SOLN
INTRAVENOUS | Status: DC | PRN
  Administered 2023-03-03: 1000 mg via INTRAVENOUS

## 2023-03-03 MED ORDER — ACETAMINOPHEN 10 MG/ML IV SOLN
INTRAVENOUS | Status: AC
  Filled 2023-03-03: qty 100

## 2023-03-03 MED ORDER — PROPOFOL 1000 MG/100ML IV EMUL
INTRAVENOUS | Status: AC
  Filled 2023-03-03: qty 100

## 2023-03-03 MED ORDER — CEFAZOLIN SODIUM-DEXTROSE 2-4 GM/100ML-% IV SOLN
2.0000 g | INTRAVENOUS | Status: AC
  Administered 2023-03-03: 2 g via INTRAVENOUS
  Filled 2023-03-03: qty 100

## 2023-03-03 MED ORDER — CHLORHEXIDINE GLUCONATE 0.12 % MT SOLN
15.0000 mL | Freq: Once | OROMUCOSAL | Status: AC
  Administered 2023-03-03: 15 mL via OROMUCOSAL
  Filled 2023-03-03: qty 15

## 2023-03-03 MED ORDER — ACETAMINOPHEN 500 MG PO TABS: 1000.0000 mg | ORAL_TABLET | Freq: Once | ORAL | Status: DC

## 2023-03-03 MED ORDER — PROPOFOL 10 MG/ML IV BOLUS
INTRAVENOUS | Status: AC
  Filled 2023-03-03: qty 20

## 2023-03-03 MED ORDER — ONDANSETRON HCL 4 MG/2ML IJ SOLN
INTRAMUSCULAR | Status: DC | PRN
  Administered 2023-03-03 (×2): 4 mg via INTRAVENOUS

## 2023-03-03 MED ORDER — PROMETHAZINE HCL 25 MG/ML IJ SOLN: 6.2500 mg | INTRAMUSCULAR | Status: DC | PRN

## 2023-03-03 MED ORDER — ACETAMINOPHEN 10 MG/ML IV SOLN: 1000.0000 mg | Freq: Once | INTRAVENOUS | Status: DC | PRN

## 2023-03-03 MED ORDER — PROPOFOL 10 MG/ML IV BOLUS
INTRAVENOUS | Status: DC | PRN
  Administered 2023-03-03: 150 mg via INTRAVENOUS

## 2023-03-03 MED ORDER — KETAMINE HCL 50 MG/5ML IJ SOSY
PREFILLED_SYRINGE | INTRAMUSCULAR | Status: AC
  Filled 2023-03-03: qty 5

## 2023-03-03 MED ORDER — HYDROMORPHONE HCL 1 MG/ML IJ SOLN
0.2500 mg | INTRAMUSCULAR | Status: DC | PRN
  Administered 2023-03-03 (×4): 0.5 mg via INTRAVENOUS

## 2023-03-03 MED ORDER — ACETAMINOPHEN 325 MG PO TABS: 325.0000 mg | ORAL_TABLET | ORAL | Status: DC | PRN

## 2023-03-03 MED ORDER — FENTANYL CITRATE (PF) 250 MCG/5ML IJ SOLN
INTRAMUSCULAR | Status: AC
  Filled 2023-03-03: qty 5

## 2023-03-03 MED ORDER — ORAL CARE MOUTH RINSE: 15.0000 mL | Freq: Once | OROMUCOSAL | Status: AC

## 2023-03-03 MED ORDER — PHENYLEPHRINE HCL-NACL 20-0.9 MG/250ML-% IV SOLN
INTRAVENOUS | Status: DC | PRN
  Administered 2023-03-03: 30 ug/min via INTRAVENOUS

## 2023-03-03 MED ORDER — ESMOLOL HCL 100 MG/10ML IV SOLN
INTRAVENOUS | Status: DC | PRN
  Administered 2023-03-03: 10 mg via INTRAVENOUS

## 2023-03-03 MED ORDER — FENTANYL CITRATE (PF) 100 MCG/2ML IJ SOLN
INTRAMUSCULAR | Status: DC | PRN
  Administered 2023-03-03 (×2): 25 ug via INTRAVENOUS
  Administered 2023-03-03: 50 ug via INTRAVENOUS
  Administered 2023-03-03: 25 ug via INTRAVENOUS
  Administered 2023-03-03: 50 ug via INTRAVENOUS
  Administered 2023-03-03 (×2): 25 ug via INTRAVENOUS
  Administered 2023-03-03: 75 ug via INTRAVENOUS

## 2023-03-03 MED ORDER — PROPOFOL 500 MG/50ML IV EMUL
INTRAVENOUS | Status: DC | PRN
  Administered 2023-03-03: 100 ug/kg/min via INTRAVENOUS

## 2023-03-03 MED ORDER — DEXMEDETOMIDINE HCL IN NACL 80 MCG/20ML IV SOLN
INTRAVENOUS | Status: AC
  Filled 2023-03-03: qty 40

## 2023-03-03 MED ORDER — SUGAMMADEX SODIUM 200 MG/2ML IV SOLN
INTRAVENOUS | Status: DC | PRN
  Administered 2023-03-03: 150 mg via INTRAVENOUS

## 2023-03-03 MED ORDER — ROCURONIUM BROMIDE 100 MG/10ML IV SOLN
INTRAVENOUS | Status: DC | PRN
  Administered 2023-03-03: 60 mg via INTRAVENOUS
  Administered 2023-03-03: 20 mg via INTRAVENOUS
  Administered 2023-03-03 (×4): 5 mg via INTRAVENOUS

## 2023-03-03 MED ORDER — HYDROMORPHONE HCL 1 MG/ML IJ SOLN
INTRAMUSCULAR | Status: AC
  Filled 2023-03-03: qty 1

## 2023-03-03 MED ORDER — SODIUM CHLORIDE 0.9 % IV SOLN: INTRAVENOUS | Status: DC

## 2023-03-03 MED ORDER — 0.9 % SODIUM CHLORIDE (POUR BTL) OPTIME
TOPICAL | Status: DC | PRN
  Administered 2023-03-03: 1000 mL

## 2023-03-03 MED ORDER — ACETAMINOPHEN 160 MG/5ML PO SOLN: 325.0000 mg | ORAL | Status: DC | PRN

## 2023-03-03 MED ORDER — AMISULPRIDE (ANTIEMETIC) 5 MG/2ML IV SOLN: 10.0000 mg | Freq: Once | INTRAVENOUS | Status: DC | PRN

## 2023-03-03 MED ORDER — ESMOLOL HCL 100 MG/10ML IV SOLN
INTRAVENOUS | Status: AC
  Filled 2023-03-03: qty 10

## 2023-03-03 MED ORDER — LIDOCAINE 2% (20 MG/ML) 5 ML SYRINGE
INTRAMUSCULAR | Status: DC | PRN
  Administered 2023-03-03: 40 mg via INTRAVENOUS

## 2023-03-03 SURGICAL SUPPLY — 77 items
BAG COUNTER SPONGE SURGICOUNT (BAG) ×1 IMPLANT
BAG SPNG CNTER NS LX DISP (BAG) ×1
BIT DRILL 2.5X2.75 QC CALB (BIT) IMPLANT
BIT DRILL CALIBRATED 2.7 (BIT) IMPLANT
BIT DRILL SHORT 2.0 ZI (BIT) IMPLANT
BIT OVERDRILL 2.7 ZI (BIT) IMPLANT
BNDG CMPR 5X3 KNIT ELC UNQ LF (GAUZE/BANDAGES/DRESSINGS)
BNDG CMPR 9X4 STRL LF SNTH (GAUZE/BANDAGES/DRESSINGS) ×1
BNDG CMPR STD VLCR NS LF 5.8X4 (GAUZE/BANDAGES/DRESSINGS) ×4
BNDG ELASTIC 3INX 5YD STR LF (GAUZE/BANDAGES/DRESSINGS) IMPLANT
BNDG ELASTIC 4X5.8 VLCR NS LF (GAUZE/BANDAGES/DRESSINGS) IMPLANT
BNDG ELASTIC 4X5.8 VLCR STR LF (GAUZE/BANDAGES/DRESSINGS) ×1 IMPLANT
BNDG ESMARK 4X9 LF (GAUZE/BANDAGES/DRESSINGS) ×1 IMPLANT
BNDG GAUZE DERMACEA FLUFF 4 (GAUZE/BANDAGES/DRESSINGS) ×2 IMPLANT
BNDG GZE DERMACEA 4 6PLY (GAUZE/BANDAGES/DRESSINGS) ×1
CORD BIPOLAR FORCEPS 12FT (ELECTRODE) ×1 IMPLANT
COVER MAYO STAND STRL (DRAPES) ×1 IMPLANT
COVER SURGICAL LIGHT HANDLE (MISCELLANEOUS) ×1 IMPLANT
CUFF TOURN SGL QUICK 18X4 (TOURNIQUET CUFF) ×1 IMPLANT
CUFF TOURN SGL QUICK 24 (TOURNIQUET CUFF)
CUFF TRNQT CYL 24X4X16.5-23 (TOURNIQUET CUFF) IMPLANT
DRAPE IMP U-DRAPE 54X76 (DRAPES) ×1 IMPLANT
DRAPE INCISE IOBAN 66X45 STRL (DRAPES) ×1 IMPLANT
DRAPE OEC MINIVIEW 54X84 (DRAPES) IMPLANT
DRSG ADAPTIC 3X8 NADH LF (GAUZE/BANDAGES/DRESSINGS) IMPLANT
GAUZE SPONGE 4X4 12PLY STRL (GAUZE/BANDAGES/DRESSINGS) IMPLANT
GAUZE XEROFORM 1X8 LF (GAUZE/BANDAGES/DRESSINGS) IMPLANT
GAUZE XEROFORM 5X9 LF (GAUZE/BANDAGES/DRESSINGS) IMPLANT
GLOVE BIOGEL M 8.0 STRL (GLOVE) ×1 IMPLANT
GLOVE SS BIOGEL STRL SZ 8 (GLOVE) ×1 IMPLANT
GOWN STRL REUS W/ TWL LRG LVL3 (GOWN DISPOSABLE) ×2 IMPLANT
GOWN STRL REUS W/ TWL XL LVL3 (GOWN DISPOSABLE) ×3 IMPLANT
GOWN STRL REUS W/TWL LRG LVL3 (GOWN DISPOSABLE) ×1
GOWN STRL REUS W/TWL XL LVL3 (GOWN DISPOSABLE) ×1
K-WIRE FIXATION 2.0X6 (WIRE) ×2
KIT BASIN OR (CUSTOM PROCEDURE TRAY) ×1 IMPLANT
KIT TURNOVER KIT B (KITS) ×1 IMPLANT
KWIRE FIXATION 2.0X6 (WIRE) IMPLANT
MANIFOLD NEPTUNE II (INSTRUMENTS) ×1 IMPLANT
NDL HYPO 25GX1X1/2 BEV (NEEDLE) IMPLANT
NEEDLE HYPO 25GX1X1/2 BEV (NEEDLE) ×1 IMPLANT
NS IRRIG 1000ML POUR BTL (IV SOLUTION) ×1 IMPLANT
PACK ORTHO EXTREMITY (CUSTOM PROCEDURE TRAY) ×1 IMPLANT
PACK UNIVERSAL I (CUSTOM PROCEDURE TRAY) ×1 IMPLANT
PAD ARMBOARD 7.5X6 YLW CONV (MISCELLANEOUS) ×2 IMPLANT
PAD CAST 3X4 CTTN HI CHSV (CAST SUPPLIES) IMPLANT
PAD CAST 4YDX4 CTTN HI CHSV (CAST SUPPLIES) IMPLANT
PADDING CAST COTTON 3X4 STRL (CAST SUPPLIES) ×2
PADDING CAST COTTON 4X4 STRL (CAST SUPPLIES) ×1
PLATE HUMERUS 21H (Plate) IMPLANT
SCREW CORTICAL 3.5MM 20MM (Screw) IMPLANT
SCREW CORTICAL 3.5MM 22MM (Screw) IMPLANT
SCREW CORTICAL 3.5MM 24MM (Screw) IMPLANT
SCREW LOCK CORT STAR 3.5X14 (Screw) IMPLANT
SCREW LOCK CORT STAR 3.5X18 (Screw) IMPLANT
SCREW LOCK CORT STAR 3.5X20 (Screw) IMPLANT
SCREW LOCK CORT STAR 3.5X22 (Screw) IMPLANT
SCREW LOCK MDS 2.7X19 (Screw) IMPLANT
SCREW LOCK MDS 2.7X20 (Screw) IMPLANT
SLING ARM FOAM STRAP LRG (SOFTGOODS) IMPLANT
SOL PREP POV-IOD 4OZ 10% (MISCELLANEOUS) ×3 IMPLANT
SPLINT FIBERGLASS 4X30 (CAST SUPPLIES) IMPLANT
SPONGE T-LAP 18X18 ~~LOC~~+RFID (SPONGE) IMPLANT
SUCTION TUBE FRAZIER 10FR DISP (SUCTIONS) IMPLANT
SUT FIBERWIRE 2-0 18 17.9 3/8 (SUTURE) ×1
SUT MERSILENE 4 0 P 3 (SUTURE) IMPLANT
SUT PROLENE 3 0 PS 2 (SUTURE) IMPLANT
SUT PROLENE 4 0 PS 2 18 (SUTURE) IMPLANT
SUT VIC AB 2-0 CT1 27 (SUTURE)
SUT VIC AB 2-0 CT1 TAPERPNT 27 (SUTURE) IMPLANT
SUTURE FIBERWR 2-0 18 17.9 3/8 (SUTURE) IMPLANT
SYR CONTROL 10ML LL (SYRINGE) IMPLANT
TOWEL GREEN STERILE (TOWEL DISPOSABLE) ×2 IMPLANT
TOWEL GREEN STERILE FF (TOWEL DISPOSABLE) ×1 IMPLANT
TUBE CONNECTING 12X1/4 (SUCTIONS) IMPLANT
UNDERPAD 30X36 HEAVY ABSORB (UNDERPADS AND DIAPERS) ×1 IMPLANT
WATER STERILE IRR 1000ML POUR (IV SOLUTION) ×1 IMPLANT

## 2023-03-03 NOTE — Discharge Instructions (Signed)

## 2023-03-03 NOTE — Transfer of Care (Signed)
Immediate Anesthesia Transfer of Care Note  Patient: Jasmine White  Procedure(s) Performed: Open reduction internal fixation right humerus with radial nerve neuroplasty (Right: Arm Upper)  Patient Location: PACU  Anesthesia Type:General  Level of Consciousness: drowsy  Airway & Oxygen Therapy: Patient Spontanous Breathing and Patient connected to face mask oxygen  Post-op Assessment: Report given to RN and Post -op Vital signs reviewed and stable  Post vital signs: Reviewed and stable  Last Vitals:  Vitals Value Taken Time  BP 159/63 03/03/23 1845  Temp    Pulse 73 03/03/23 1846  Resp 13 03/03/23 1846  SpO2 100 % 03/03/23 1846  Vitals shown include unvalidated device data.  Last Pain:  Vitals:   03/03/23 1221  TempSrc:   PainSc: 8          Complications: No notable events documented.

## 2023-03-03 NOTE — Op Note (Signed)
Operative note March 03, 2023.  Jasmine Severin, MD.  Date of dictation and operation March 03, 2023.  Preoperative diagnosis: Segmental right humerus fracture with butterfly fragments and comminution in a 77 year old female with history of renal transplant on chronic prednisone.  Postop diagnosis the same.  Operative procedure #1 right humerus open reduction internal fixation with a 1 hole plate and screw construct from Bi[employer] ALPS system.  #2 stress radiography right upper extremity #3 extensive radial nerve dissection and neuroplasty  Surgeon Jasmine Severin, MD  Assistant none  Complications none  Estimated blood loss less than 500 cc  Tourniquet time less than 1 hour  Indications for the procedure: Patient is 77 years of age she has a comminuted complex fracture.  I have counseled she and her family in regards to risk and benefits of surgery including neurovascular injury poor recovery and other issues as it relates to the upper extremity predicament.  With this in mind the patient will proceed accordingly.  Operation in detail: Patient was seen by myself and anesthesia taken to the operative theater.  She had an IV placed in her right foot.  She has a fistula on the left arm and the right arm is fractured.  Thus the lower extremity was used for IV access and for blood pressure cuff measurement during the operation.  After general anesthesia was induced by anesthesia department the patient was placed carefully in well-padded and a beanbag in a lateral position.  Axillary roll was placed.  The arm was carefully situated for its surgical intervention.  I then personally performed a Hibiclens scrub followed by 10-minute surgical Betadine scrub and paint.  Sterile field was cleared timeout was observed and preoperative antibiotics were infused.  The operation commenced with elevation of sterile tourniquet.  The incision was made posterior midline.  I very carefully and cautiously identified  the interval between the long and the lateral head of the triceps.  Once this interval was created I dissected distally and identified the butterfly fragment and the distal portion of the humerus.  I irrigated and performed curettage of the bony ends.  At this time the patient has a very comminuted fracture with bone chips and a butterfly fragment.  These were carefully prepared and the butterfly fragment was placed into the distal portion of the main fragment and 3 interfrag screws 2.7 mm in variety were used for purposes of securing this.  At this juncture I remove the tourniquet and dissected more proximal.  The incision was extended.  I carefully split the deep triceps between the long and lateral head and identified the radial nerve.  Under 4.5 loupe magnification I placed a vessel loop around the radial nerve and carefully protected it.  This nerve was watched at all points during the operation.  This allowed for excellent exposure.  I then identified the main shaft prepared and reduced it to the distal portion.  Following this provisional fixation was accomplished followed by placement of a 21 hole Biomet Alps plate.  This was provisionally placed checked under x-ray and following this I then placed the 3.5 screws in the oblong holes.  Following this I then very carefully and cautiously checked x-rays.  The plate sat very nicely.  The radial nerve was above the plate of course and carefully watched at all times.  Following 3 5 screw placement we then performed placement of the locking screws.  This allowed for excellent fixation.  I had greater than 5 cortices proximal and distal  for excellent fixation.  I did remove the distal because on the plate for better contouring and perfect fit.  At this time I performed a live fluoroscopy and x-ray and all quite well.  I was pleased with the AP and lateral x-ray.  The patient had excellent fixation and no complications.  The patient was placed to  full range of motion there is no impingement posteriorly or anteriorly with flexion and extension.  I reviewed this very carefully and closely.  Thus the patient underwent open reduction internal fixation comminuted complex right humerus fracture with butterfly fragment utilizing a Biomet plate and screw construct with combination interfrag and locking screws.  The patient underwent an extensive radial nerve neuroplasty which was a distinct and separate portion of the procedure.  Patient underwent stress radiography of course.  The interval between the long and lateral triceps was repaired with 2-0 FiberWire.  Hemostasis was excellent.  There were no complications.  The skin edge was repaired with 3-0 Prolene.  Adaptic Xeroform 4 x 4's gauze and a long-arm splint were placed without difficulty.  The patient tolerated this well.  It was a pleasure to see the patient today.  I discussed all issues with the family.  She will be observed in the recovery room and if stable going home tonight as they prefer this and she has remained stable throughout her operative intervention today.  Will watch her very closely.  Will see her in 14 days.  The family has my cell phone for any problems.  Jasmine Severin MD

## 2023-03-03 NOTE — Anesthesia Postprocedure Evaluation (Signed)
Anesthesia Post Note  Patient: Jasmine White  Procedure(s) Performed: Open reduction internal fixation right humerus with radial nerve neuroplasty (Right: Arm Upper)     Patient location during evaluation: PACU Anesthesia Type: General Level of consciousness: awake and alert Pain management: pain level controlled Vital Signs Assessment: post-procedure vital signs reviewed and stable Respiratory status: spontaneous breathing, nonlabored ventilation and respiratory function stable Cardiovascular status: blood pressure returned to baseline and stable Postop Assessment: no apparent nausea or vomiting Anesthetic complications: no  No notable events documented.  Last Vitals:  Vitals:   03/03/23 1930 03/03/23 1945  BP: (!) 151/67 (!) 150/57  Pulse: 75 75  Resp: 19 12  Temp:  36.6 C  SpO2: 96% 96%    Last Pain:  Vitals:   03/03/23 1930  TempSrc:   PainSc: Asleep                 Gyan Cambre,W. EDMOND

## 2023-03-03 NOTE — H&P (Signed)
Jasmine White is an 77 y.o. female.   Chief Complaint: The patient presents for reconstructive efforts right humerus fracture comminuted complex HPI: Patient presents with comminuted complex humerus fracture segmental in nature.  Will plan to proceed with radial nerve neuroplasty repair reconstruction and ORIF as outlined.  I had an extensive discussion with family yesterday.  This was a fall essentially same level going up 1-2 stairs at a restaurant.  Patient presents for evaluation and treatment of the of their upper extremity predicament. The patient denies neck, back, chest or  abdominal pain. The patient notes that they have no lower extremity problems. The patients primary complaint is noted. We are planning surgical care pathway for the upper extremity.  Past Medical History:  Diagnosis Date   Arthritis    Bruises easily    Cancer (HCC)    basal skin cancer   Chronic back pain    HNP/stenosis and radiculopathy   History of iron deficiency anemia    Hyperlipidemia    takes Pravastatin daily   Hypertension    history of   Hypothyroidism    takes Synthroid daily   Insomnia    takes Elevil nightly   Joint pain    Joint swelling    Low BP    past some sedation   Nocturia    Pneumonia    09/2021   PONV (postoperative nausea and vomiting)    with knee replacement b/p dropped   Sinusitis    finished zpak yesterday   Urinary frequency     Past Surgical History:  Procedure Laterality Date   A/V FISTULAGRAM N/A 05/17/2020   Procedure: A/V FISTULAGRAM - Left Arm;  Surgeon: Cephus Shelling, MD;  [facility];  Service: Cardiovascular;  Laterality: N/A;   ABDOMINAL HYSTERECTOMY     AV FISTULA PLACEMENT Left 04/02/2020   Procedure: ARTERIOVENOUS (AV) FISTULA CREATION;  Surgeon: Larina Earthly, MD;  [facility]: [facility];  Service: Vascular;  Laterality: Left;   BACK SURGERY     basal cell skin cancer     on face and forehead   bil knee replacements      BREAST BIOPSY     left breast/benign   COLONOSCOPY     IR FLUORO GUIDE CV LINE RIGHT  03/20/2020   IR US GUIDE VASC ACCESS RIGHT  03/20/2020   KIDNEY TRANSPLANT     08/2022   LUMBAR LAMINECTOMY/DECOMPRESSION MICRODISCECTOMY  08/13/2012   Procedure: LUMBAR LAMINECTOMY/DECOMPRESSION MICRODISCECTOMY 1 LEVEL;  Surgeon: Maeola Harman, MD;  [facility];  Service: Neurosurgery;  Laterality: Left;  Left lumbar one-two Microdiskectomy   PERIPHERAL VASCULAR BALLOON ANGIOPLASTY Left 05/17/2020   Procedure: PERIPHERAL VASCULAR BALLOON ANGIOPLASTY;  Surgeon: Cephus Shelling, MD;  [facility];  Service: Cardiovascular;  Laterality: Left;  Arm fistula    RIGHT/LEFT HEART CATH AND CORONARY ANGIOGRAPHY N/A 01/17/2021   Procedure: RIGHT/LEFT HEART CATH AND CORONARY ANGIOGRAPHY;  Surgeon: Runell Gess, MD;  [facility];  Service: Cardiovascular;  Laterality: N/A;    Family History  Problem Relation Age of Onset   Hypertension Mother    Heart disease Mother    Stroke Father    Heart disease Father    Alcohol abuse Father    Arthritis Brother    Cancer Brother 40       prostate   Arthritis Brother    Colon cancer Neg Hx    Colon polyps Neg Hx  Stomach cancer Neg Hx    Rectal cancer Neg Hx    Social History:  reports that she has never smoked. She has never used smokeless tobacco. She reports that she does not drink alcohol and does not use drugs.  Allergies:  Allergies  Allergen Reactions   Hydrochlorothiazide Other (See Comments)    Hx of severe Hyponatremia while taking HCTZ - now contraindicated Other reaction(s): low sodium, Other (See Comments) Hx of severe Hyponatremia while taking HCTZ - now contraindicated Hx of severe Hyponatremia while taking HCTZ - now contraindicated    Nsaids Other (See Comments)    Renal patient   Other Other (See Comments)    If ever given "strong" pain medication, the patient will require something to  counteract the nausea   Oxycodone Other (See Comments)   Percocet [Oxycodone-Acetaminophen] Nausea Only   Tramadol Nausea Only and Other (See Comments)    Patient remarked this made her "feel crazy" (also) Other reaction(s): nausea, Nausea And Vomiting, Nausea Only Patient remarked this made her "feel crazy" (also)     Medications Prior to Admission  Medication Sig Dispense Refill   Ascorbic Acid (VITAMIN C) 1000 MG tablet Take 1,000 mg by mouth daily.     aspirin EC 81 MG tablet Take 81 mg by mouth daily.     cephALEXin (KEFLEX) 500 MG capsule Take 500 mg by mouth 4 (four) times daily.     escitalopram (LEXAPRO) 20 MG tablet Take 20 mg by mouth at bedtime.     HYDROcodone-acetaminophen (NORCO/VICODIN) 5-325 MG tablet Take 1 tablet by mouth every 4 (four) hours as needed for moderate pain or severe pain.     loratadine (CLARITIN) 10 MG tablet Take 10 mg by mouth daily.     methocarbamol (ROBAXIN) 500 MG tablet Take 500 mg by mouth every 6 (six) hours as needed for muscle spasms.     mycophenolate (CELLCEPT) 500 MG tablet Take 1,000 mg by mouth 2 (two) times daily.     predniSONE (DELTASONE) 5 MG tablet Take 5 mg by mouth daily with breakfast.     sulfamethoxazole-trimethoprim (BACTRIM) 400-80 MG tablet Take 1 tablet by mouth daily.     SYNTHROID 88 MCG tablet TAKE 1 TABLET BY MOUTH ONCE DAILY BEFORE BREAKFAST (Patient taking differently: Take 88 mcg by mouth daily before breakfast.) 90 tablet 1   tacrolimus (PROGRAF) 1 MG capsule Take 2 mg by mouth 2 (two) times daily.     ZOFRAN 4 MG tablet Take 4 mg by mouth every 8 (eight) hours as needed for nausea or vomiting.     albuterol (VENTOLIN HFA) 108 (90 Base) MCG/ACT inhaler Inhale 2 puffs into the lungs every 6 (six) hours as needed for wheezing or shortness of breath.      Results for orders placed or performed during the hospital encounter of 03/03/23 (from the past 48 hour(s))  Basic metabolic panel per protocol     Status: Abnormal    Collection Time: 03/03/23 12:00 PM  Result Value Ref Range   Sodium 135 135 - 145 mmol/L   Potassium 4.2 3.5 - 5.1 mmol/L   Chloride 98 98 - 111 mmol/L   CO2 25 22 - 32 mmol/L   Glucose, Bld 141 (H) 70 - 99 mg/dL    Comment: Glucose reference range applies only to samples taken after fasting for at least 8 hours.   BUN 18 8 - 23 mg/dL   Creatinine, Ser 1.61 0.44 - 1.00 mg/dL   Calcium 9.8  8.9 - 10.3 mg/dL   GFR, Estimated >81 >19 mL/min    Comment: (NOTE) Calculated using the CKD-EPI Creatinine Equation (2021)    Anion gap 12 5 - 15    Comment: Performed at [facility], [address] [place], [place] [address]  CBC per protocol     Status: None   Collection Time: 03/03/23 12:00 PM  Result Value Ref Range   WBC 7.0 4.0 - 10.5 K/uL   RBC 4.04 3.87 - 5.11 MIL/uL   Hemoglobin 12.1 12.0 - 15.0 g/dL   HCT 95.6 21.3 - 08.6 %   MCV 95.3 80.0 - 100.0 fL   MCH 30.0 26.0 - 34.0 pg   MCHC 31.4 30.0 - 36.0 g/dL   RDW 57.8 46.9 - 62.9 %   Platelets 155 150 - 400 K/uL   nRBC 0.0 0.0 - 0.2 %    Comment: Performed at [facility], [address] [place], [place] [address]   No results found.  Review of Systems  Blood pressure (!) 156/62, pulse 75, temperature 98.6 F (37 C), temperature source Oral, resp. rate 18, height 5\' 6"  (1.676 m), weight 65.8 kg, SpO2 95 %. Physical Exam  Comminuted complex distal/mid third humerus fracture with extension and butterfly fragment.  This appears to be a closed injury of course.  Patient has pain and no evidence of obvious compartment syndrome.  I reviewed this with patient at length and the findings.  She has a history of a renal transplant this is fairly stable at present time.  I reviewed this with her at length.  She is cleared for surgery.  She is on prednisone chronically.  We discussed all issues plans and concerns.  The patient is alert and oriented in no acute distress. The patient complains of pain in the affected upper  extremity.  The patient is noted to have a normal HEENT exam. Lung fields show equal chest expansion and no shortness of breath. Abdomen exam is nontender without distention. Lower extremity examination does not show any fracture dislocation or blood clot symptoms. Pelvis is stable and the neck and back are stable and nontender.  Assessment/Plan We will plan to proceed with open reduction internal fixation with neuroplasty and repair is necessary.  I discussed with patient all issues plans and concerns as well as treatment algorithms.  Will do everything in our power to give her the best upper extremity possible.  She understands risk of nerve injury and other issues as extensively discussed.  We are planning surgery for your upper extremity. The risk and benefits of surgery to include risk of bleeding, infection, anesthesia,  damage to normal structures and failure of the surgery to accomplish its intended goals of relieving symptoms and restoring function have been discussed in detail. With this in mind we plan to proceed. I have specifically discussed with the patient the pre-and postoperative regime and the dos and don'ts and risk and benefits in great detail. Risk and benefits of surgery also include risk of dystrophy(CRPS), chronic nerve pain, failure of the healing process to go onto completion and other inherent risks of surgery The relavent the pathophysiology of the disease/injury process, as well as the alternatives for treatment and postoperative course of action has been discussed in great detail with the patient who desires to proceed.  We will do everything in our power to help you (the patient) restore function to the upper extremity. It is a pleasure to see this patient today.  Oletta Cohn III, MD 03/03/2023, 2:27 PM

## 2023-03-03 NOTE — Anesthesia Procedure Notes (Signed)
Procedure Name: Intubation Date/Time: 03/03/2023 3:19 PM  Performed by: Marny Lowenstein, CRNAPre-anesthesia Checklist: Patient identified, Emergency Drugs available, Suction available and Patient being monitored Patient Re-evaluated:Patient Re-evaluated prior to induction Oxygen Delivery Method: Circle system utilized Preoxygenation: Pre-oxygenation with 100% oxygen Induction Type: IV induction Ventilation: Mask ventilation without difficulty Laryngoscope Size: Mac and 3 Grade View: Grade II Tube type: Oral Tube size: 7.0 mm Number of attempts: 2 Airway Equipment and Method: Stylet Placement Confirmation: ETT inserted through vocal cords under direct vision, positive ETCO2 and breath sounds checked- equal and bilateral Secured at: 21 cm Tube secured with: Tape Dental Injury: Teeth and Oropharynx as per pre-operative assessment

## 2023-03-05 ENCOUNTER — Encounter (HOSPITAL_COMMUNITY): Payer: Self-pay | Admitting: Orthopedic Surgery

## 2023-03-10 ENCOUNTER — Ambulatory Visit: Payer: Medicare PPO | Admitting: Vascular Surgery

## 2023-03-10 ENCOUNTER — Other Ambulatory Visit: Payer: Self-pay

## 2023-03-10 ENCOUNTER — Encounter: Payer: Self-pay | Admitting: Vascular Surgery

## 2023-03-10 VITALS — BP 136/70 | HR 87 | Temp 98.1°F | Wt 147.0 lb

## 2023-03-10 DIAGNOSIS — Z940 Kidney transplant status: Secondary | ICD-10-CM | POA: Diagnosis not present

## 2023-03-10 NOTE — H&P (View-Only) (Signed)
[facility] [place]  ASSESSMENT / PLAN: 76 y.o. female with end-stage renal disease status post successful kidney transplantation.  She desires excision of her left upper extremity brachiocephalic arteriovenous fistula.  I think this is reasonable and safe to do.  We will plan to do this in the near future as her schedule allows.  CHIEF COMPLAINT: Requesting removal of arteriovenous fistula  HISTORY OF PRESENT ILLNESS: Jasmine White is a 76 y.o. female referred to clinic for discussion of possible removal of a left arm arteriovenous fistula.  Patient has a history of end-stage renal disease.  She had a kidney transplant in October 2023 and has had normalization of her renal function since then.  She is no longer needed dialysis.  She recently suffered a comminuted humeral fracture treated by Dr. Graham mg.  She remains in a splint for this.  She desires excision of her left arm will if at all possible.  I reviewed the 2 options available, including simple ligation, or complete excision.  She desires complete excision.  Past Medical History:  Diagnosis Date   Arthritis    Bruises easily    Cancer (HCC)    basal skin cancer   Chronic back pain    HNP/stenosis and radiculopathy   History of iron deficiency anemia    Hyperlipidemia    takes Pravastatin daily   Hypertension    history of   Hypothyroidism    takes Synthroid daily   Insomnia    takes Elevil nightly   Joint pain    Joint swelling    Low BP    past some sedation   Nocturia    Pneumonia    09/2021   PONV (postoperative nausea and vomiting)    with knee replacement b/p dropped   Sinusitis    finished zpak yesterday   Urinary frequency     Past Surgical History:  Procedure Laterality Date   A/V FISTULAGRAM N/A 05/17/2020   Procedure: A/V FISTULAGRAM - Left Arm;  Surgeon: Clark, Christopher J, MD;  [facility];  Service: Cardiovascular;  Laterality: N/A;   ABDOMINAL  HYSTERECTOMY     AV FISTULA PLACEMENT Left 04/02/2020   Procedure: ARTERIOVENOUS (AV) FISTULA CREATION;  Surgeon: Early, Todd F, MD;  [facility];  Service: Vascular;  Laterality: Left;   BACK SURGERY     basal cell skin cancer     on face and forehead   bil knee replacements     BREAST BIOPSY     left breast/benign   COLONOSCOPY     IR FLUORO GUIDE CV LINE RIGHT  03/20/2020   IR US GUIDE VASC ACCESS RIGHT  03/20/2020   KIDNEY TRANSPLANT     08/2022   LUMBAR LAMINECTOMY/DECOMPRESSION MICRODISCECTOMY  08/13/2012   Procedure: LUMBAR LAMINECTOMY/DECOMPRESSION MICRODISCECTOMY 1 LEVEL;  Surgeon: Joseph Stern, MD;  [facility];  Service: Neurosurgery;  Laterality: Left;  Left lumbar one-two Microdiskectomy   ORIF HUMERUS FRACTURE Right 03/03/2023   Procedure: Open reduction internal fixation right humerus with radial nerve neuroplasty;  Surgeon: Gramig, William, MD;  [facility];  Service: Orthopedics;  Laterality: Right;  2 hrs   PERIPHERAL VASCULAR BALLOON ANGIOPLASTY Left 05/17/2020   Procedure: PERIPHERAL VASCULAR BALLOON ANGIOPLASTY;  Surgeon: Clark, Christopher J, MD;  [facility];  Service: Cardiovascular;  Laterality: Left;  Arm fistula    RIGHT/LEFT HEART CATH AND CORONARY ANGIOGRAPHY N/A 01/17/2021   Procedure: RIGHT/LEFT HEART CATH AND CORONARY   ANGIOGRAPHY;  Surgeon: Berry, Jonathan J, MD;  [facility];  Service: Cardiovascular;  Laterality: N/A;    Family History  Problem Relation Age of Onset   Hypertension Mother    Heart disease Mother    Stroke Father    Heart disease Father    Alcohol abuse Father    Arthritis Brother    Cancer Brother 68       prostate   Arthritis Brother    Colon cancer Neg Hx    Colon polyps Neg Hx    Stomach cancer Neg Hx    Rectal cancer Neg Hx     Social History   Socioeconomic History   Marital status: Married    Spouse name: Ray   Number of children: 2   Years of education: Not on  file   Highest education level: Not on file  Occupational History   Occupation: retired  Tobacco Use   Smoking status: Never   Smokeless tobacco: Never  Vaping Use   Vaping Use: Never used  Substance and Sexual Activity   Alcohol use: No    Alcohol/week: 0.0 standard drinks of alcohol   Drug use: No   Sexual activity: Not Currently  Other Topics Concern   Not on file  Social History Narrative   Not on file   Social Determinants of Health   Financial Resource Strain: Not on file  Food Insecurity: No Food Insecurity (07/03/2022)   Hunger Vital Sign    Worried About Running Out of Food in the Last Year: Never true    Ran Out of Food in the Last Year: Never true  Transportation Needs: No Transportation Needs (07/03/2022)   PRAPARE - Transportation    Lack of Transportation (Medical): No    Lack of Transportation (Non-Medical): No  Physical Activity: Not on file  Stress: Not on file  Social Connections: Not on file  Intimate Partner Violence: Not on file    Allergies  Allergen Reactions   Hydrochlorothiazide Other (See Comments)    Hx of severe Hyponatremia while taking HCTZ - now contraindicated Other reaction(s): low sodium, Other (See Comments) Hx of severe Hyponatremia while taking HCTZ - now contraindicated Hx of severe Hyponatremia while taking HCTZ - now contraindicated    Nsaids Other (See Comments)    Renal patient   Other Other (See Comments)    If ever given "strong" pain medication, the patient will require something to counteract the nausea   Oxycodone Other (See Comments)   Percocet [Oxycodone-Acetaminophen] Nausea Only   Tramadol Nausea Only and Other (See Comments)    Patient remarked this made her "feel crazy" (also) Other reaction(s): nausea, Nausea And Vomiting, Nausea Only Patient remarked this made her "feel crazy" (also)     Current Outpatient Medications  Medication Sig Dispense Refill   albuterol (VENTOLIN HFA) 108 (90 Base) MCG/ACT  inhaler Inhale 2 puffs into the lungs every 6 (six) hours as needed for wheezing or shortness of breath.     Ascorbic Acid (VITAMIN C) 1000 MG tablet Take 1,000 mg by mouth daily.     aspirin EC 81 MG tablet Take 81 mg by mouth daily.     cephALEXin (KEFLEX) 500 MG capsule Take 500 mg by mouth 4 (four) times daily.     escitalopram (LEXAPRO) 20 MG tablet Take 20 mg by mouth at bedtime.     HYDROcodone-acetaminophen (NORCO/VICODIN) 5-325 MG tablet Take 1 tablet by mouth every 4 (four) hours as needed for moderate   pain or severe pain.     loratadine (CLARITIN) 10 MG tablet Take 10 mg by mouth daily.     methocarbamol (ROBAXIN) 500 MG tablet Take 500 mg by mouth every 6 (six) hours as needed for muscle spasms.     mycophenolate (CELLCEPT) 500 MG tablet Take 1,000 mg by mouth 2 (two) times daily.     predniSONE (DELTASONE) 5 MG tablet Take 5 mg by mouth daily with breakfast.     sulfamethoxazole-trimethoprim (BACTRIM) 400-80 MG tablet Take 1 tablet by mouth daily.     SYNTHROID 88 MCG tablet TAKE 1 TABLET BY MOUTH ONCE DAILY BEFORE BREAKFAST (Patient taking differently: Take 88 mcg by mouth daily before breakfast.) 90 tablet 1   tacrolimus (PROGRAF) 1 MG capsule Take 2 mg by mouth 2 (two) times daily.     ZOFRAN 4 MG tablet Take 4 mg by mouth every 8 (eight) hours as needed for nausea or vomiting.     No current facility-administered medications for this visit.    PHYSICAL EXAM There were no vitals filed for this visit.  Elderly woman in no acute distress Regular rate and rhythm Unlabored breathing Left arm AV fistula with smooth thrill.  Some hypopigmentation of the skin overlying the fistula but no evidence of ulceration or atrophy over the skin threatening bleeding.   PERTINENT LABORATORY AND RADIOLOGIC DATA  Most recent CBC    Latest Ref Rng & Units 03/03/2023   12:00 PM 01/17/2021   11:02 AM 01/17/2021   10:59 AM  CBC  WBC 4.0 - 10.5 K/uL 7.0     Hemoglobin 12.0 - 15.0 g/dL 12.1   10.9  10.9    10.9   Hematocrit 36.0 - 46.0 % 38.5  32.0  32.0    32.0   Platelets 150 - 400 K/uL 155        Most recent CMP    Latest Ref Rng & Units 03/03/2023   12:00 PM 01/17/2021   11:02 AM 01/17/2021   10:59 AM  CMP  Glucose 70 - 99 mg/dL 141     BUN 8 - 23 mg/dL 18     Creatinine 0.44 - 1.00 mg/dL 0.72     Sodium 135 - 145 mmol/L 135  138  138    138   Potassium 3.5 - 5.1 mmol/L 4.2  3.6  3.7    3.6   Chloride 98 - 111 mmol/L 98     CO2 22 - 32 mmol/L 25     Calcium 8.9 - 10.3 mg/dL 9.8       Renal function Estimated Creatinine Clearance: 55.1 mL/min (by C-G formula based on SCr of 0.72 mg/dL).  Hgb A1c MFr Bld (%)  Date Value  03/21/2020 5.9 (H)    LDL (calc)  Date Value Ref Range Status  12/21/2012 93 <100 mg/dL Final    Comment:    LDL-C is inaccurate if patient is nonfasting.   Reference Range: ---------------- Optimal:            <100 Near/Above Optimal: 100-129 Borderline High:    130-159 High:               160-189 Very High:          >=190       LDLC SERPL CALC-[facility]  Date Value Ref Range Status  07/06/2014 102 (H) 0 - 99 mg/dL Final    Comment:                                Optimal               <  100                           Above optimal     100 -  129                           Borderline        130 -  159                           High              160 -  189                           Very high             >  189 **Effective July 17, 2014 the reference interval**   for LDL-C will be changing to:                          0 - 19 years         0 - 109                             >19 years         0 -  99 LDL-C is inaccurate if patient is non-fasting.   LDL Chol Calc (NIH)  Date Value Ref Range Status  03/01/2020 94 0 - 99 mg/dL Final   LDL Cholesterol  Date Value Ref Range Status  01/17/2021 53 0 - 99 mg/dL Final    Comment:           Total Cholesterol/HDL:CHD Risk Coronary Heart Disease Risk Table                     Men    Women  1/2 Average Risk   3.4   3.3  Average Risk       5.0   4.4  2 X Average Risk   9.6   7.1  3 X Average Risk  23.4   11.0        Use the calculated Patient Ratio above and the CHD Risk Table to determine the patient's CHD Risk.        ATP III CLASSIFICATION (LDL):  <100     mg/dL   Optimal  100-129  mg/dL   Near or Above                    Optimal  130-159  mg/dL   Borderline  160-189  mg/dL   High  >190     mg/dL   Very High Performed at [facility], [address] , [place] [address]    LDL-C  Date Value Ref Range Status  12/26/2014 96 0 - 99 mg/dL Final    Comment:                              Optimal               <  100                             Above optimal     100 -  129                           Borderline        130 -  159                           High              160 -  189                           Very high             >  189 LDL-C is inaccurate if patient is non-fasting.     Manoj Enriquez N. Holliday Sheaffer, MD FACS [facility] [place] [facility] Office Phone Number: [phone] 03/10/2023 7:41 AM   Total time spent on preparing this encounter including chart review, data review, collecting history, examining the patient, coordinating care for this Patient, 40 minutes.  Portions of this report may have been transcribed using voice recognition software.  Every effort has been made to ensure accuracy; however, inadvertent computerized transcription errors may still be present.    

## 2023-03-10 NOTE — Progress Notes (Signed)
[facility] [place]  ASSESSMENT / PLAN: 77 y.o. female with end-stage renal disease status post successful kidney transplantation.  She desires excision of her left upper extremity brachiocephalic arteriovenous fistula.  I think this is reasonable and safe to do.  We will plan to do this in the near future as her schedule allows.  CHIEF COMPLAINT: Requesting removal of arteriovenous fistula  HISTORY OF PRESENT ILLNESS: Jasmine White is a 77 y.o. female referred to clinic for discussion of possible removal of a left arm arteriovenous fistula.  Patient has a history of end-stage renal disease.  She had a kidney transplant in October 2023 and has had normalization of her renal function since then.  She is no longer needed dialysis.  She recently suffered a comminuted humeral fracture treated by Dr. Cheree Ditto mg.  She remains in a splint for this.  She desires excision of her left arm will if at all possible.  I reviewed the 2 options available, including simple ligation, or complete excision.  She desires complete excision.  Past Medical History:  Diagnosis Date   Arthritis    Bruises easily    Cancer (HCC)    basal skin cancer   Chronic back pain    HNP/stenosis and radiculopathy   History of iron deficiency anemia    Hyperlipidemia    takes Pravastatin daily   Hypertension    history of   Hypothyroidism    takes Synthroid daily   Insomnia    takes Elevil nightly   Joint pain    Joint swelling    Low BP    past some sedation   Nocturia    Pneumonia    09/2021   PONV (postoperative nausea and vomiting)    with knee replacement b/p dropped   Sinusitis    finished zpak yesterday   Urinary frequency     Past Surgical History:  Procedure Laterality Date   A/V FISTULAGRAM N/A 05/17/2020   Procedure: A/V FISTULAGRAM - Left Arm;  Surgeon: Cephus Shelling, MD;  Location: [facility];  Service: Cardiovascular;  Laterality: N/A;   ABDOMINAL  HYSTERECTOMY     AV FISTULA PLACEMENT Left 04/02/2020   Procedure: ARTERIOVENOUS (AV) FISTULA CREATION;  Surgeon: Larina Earthly, MD;  Location: [facility] OR;  Service: Vascular;  Laterality: Left;   BACK SURGERY     basal cell skin cancer     on face and forehead   bil knee replacements     BREAST BIOPSY     left breast/benign   COLONOSCOPY     IR FLUORO GUIDE CV LINE RIGHT  03/20/2020   IR US GUIDE VASC ACCESS RIGHT  03/20/2020   KIDNEY TRANSPLANT     08/2022   LUMBAR LAMINECTOMY/DECOMPRESSION MICRODISCECTOMY  08/13/2012   Procedure: LUMBAR LAMINECTOMY/DECOMPRESSION MICRODISCECTOMY 1 LEVEL;  Surgeon: Maeola Harman, MD;  Location: [facility]URO [facility];  Service: Neurosurgery;  Laterality: Left;  Left lumbar one-two Microdiskectomy   ORIF HUMERUS FRACTURE Right 03/03/2023   Procedure: Open reduction internal fixation right humerus with radial nerve neuroplasty;  Surgeon: Dominica Severin, MD;  Location: [facility] OR;  Service: Orthopedics;  Laterality: Right;  2 hrs   PERIPHERAL VASCULAR BALLOON ANGIOPLASTY Left 05/17/2020   Procedure: PERIPHERAL VASCULAR BALLOON ANGIOPLASTY;  Surgeon: Cephus Shelling, MD;  Location: [facility];  Service: Cardiovascular;  Laterality: Left;  Arm fistula    RIGHT/LEFT HEART CATH AND CORONARY ANGIOGRAPHY N/A 01/17/2021   Procedure: RIGHT/LEFT HEART CATH AND CORONARY  ANGIOGRAPHY;  Surgeon: Runell Gess, MD;  Location: [facility] INVASIVE CV LAB;  Service: Cardiovascular;  Laterality: N/A;    Family History  Problem Relation Age of Onset   Hypertension Mother    Heart disease Mother    Stroke Father    Heart disease Father    Alcohol abuse Father    Arthritis Brother    Cancer Brother 40       prostate   Arthritis Brother    Colon cancer Neg Hx    Colon polyps Neg Hx    Stomach cancer Neg Hx    Rectal cancer Neg Hx     Social History   Socioeconomic History   Marital status: Married    Spouse name: Ray   Number of children: 2   Years of education: Not on  file   Highest education level: Not on file  Occupational History   Occupation: retired  Tobacco Use   Smoking status: Never   Smokeless tobacco: Never  Vaping Use   Vaping Use: Never used  Substance and Sexual Activity   Alcohol use: No    Alcohol/week: 0.0 standard drinks of alcohol   Drug use: No   Sexual activity: Not Currently  Other Topics Concern   Not on file  Social History Narrative   Not on file   Social Determinants of Health   Financial Resource Strain: Not on file  Food Insecurity: No Food Insecurity (07/03/2022)   Hunger Vital Sign    Worried About Running Out of Food in the Last Year: Never true    Ran Out of Food in the Last Year: Never true  Transportation Needs: No Transportation Needs (07/03/2022)   PRAPARE - Administrator, Civil Service (Medical): No    Lack of Transportation (Non-Medical): No  Physical Activity: Not on file  Stress: Not on file  Social Connections: Not on file  Intimate Partner Violence: Not on file    Allergies  Allergen Reactions   Hydrochlorothiazide Other (See Comments)    Hx of severe Hyponatremia while taking HCTZ - now contraindicated Other reaction(s): low sodium, Other (See Comments) Hx of severe Hyponatremia while taking HCTZ - now contraindicated Hx of severe Hyponatremia while taking HCTZ - now contraindicated    Nsaids Other (See Comments)    Renal patient   Other Other (See Comments)    If ever given "strong" pain medication, the patient will require something to counteract the nausea   Oxycodone Other (See Comments)   Percocet [Oxycodone-Acetaminophen] Nausea Only   Tramadol Nausea Only and Other (See Comments)    Patient remarked this made her "feel crazy" (also) Other reaction(s): nausea, Nausea And Vomiting, Nausea Only Patient remarked this made her "feel crazy" (also)     Current Outpatient Medications  Medication Sig Dispense Refill   albuterol (VENTOLIN HFA) 108 (90 Base) MCG/ACT  inhaler Inhale 2 puffs into the lungs every 6 (six) hours as needed for wheezing or shortness of breath.     Ascorbic Acid (VITAMIN C) 1000 MG tablet Take 1,000 mg by mouth daily.     aspirin EC 81 MG tablet Take 81 mg by mouth daily.     cephALEXin (KEFLEX) 500 MG capsule Take 500 mg by mouth 4 (four) times daily.     escitalopram (LEXAPRO) 20 MG tablet Take 20 mg by mouth at bedtime.     HYDROcodone-acetaminophen (NORCO/VICODIN) 5-325 MG tablet Take 1 tablet by mouth every 4 (four) hours as needed for moderate  pain or severe pain.     loratadine (CLARITIN) 10 MG tablet Take 10 mg by mouth daily.     methocarbamol (ROBAXIN) 500 MG tablet Take 500 mg by mouth every 6 (six) hours as needed for muscle spasms.     mycophenolate (CELLCEPT) 500 MG tablet Take 1,000 mg by mouth 2 (two) times daily.     predniSONE (DELTASONE) 5 MG tablet Take 5 mg by mouth daily with breakfast.     sulfamethoxazole-trimethoprim (BACTRIM) 400-80 MG tablet Take 1 tablet by mouth daily.     SYNTHROID 88 MCG tablet TAKE 1 TABLET BY MOUTH ONCE DAILY BEFORE BREAKFAST (Patient taking differently: Take 88 mcg by mouth daily before breakfast.) 90 tablet 1   tacrolimus (PROGRAF) 1 MG capsule Take 2 mg by mouth 2 (two) times daily.     ZOFRAN 4 MG tablet Take 4 mg by mouth every 8 (eight) hours as needed for nausea or vomiting.     No current facility-administered medications for this visit.    PHYSICAL EXAM There were no vitals filed for this visit.  Elderly woman in no acute distress Regular rate and rhythm Unlabored breathing Left arm AV fistula with smooth thrill.  Some hypopigmentation of the skin overlying the fistula but no evidence of ulceration or atrophy over the skin threatening bleeding.   PERTINENT LABORATORY AND RADIOLOGIC DATA  Most recent CBC    Latest Ref Rng & Units 03/03/2023   12:00 PM 01/17/2021   11:02 AM 01/17/2021   10:59 AM  CBC  WBC 4.0 - 10.5 K/uL 7.0     Hemoglobin 12.0 - 15.0 g/dL 16.1   09.6  04.5    40.9   Hematocrit 36.0 - 46.0 % 38.5  32.0  32.0    32.0   Platelets 150 - 400 K/uL 155        Most recent CMP    Latest Ref Rng & Units 03/03/2023   12:00 PM 01/17/2021   11:02 AM 01/17/2021   10:59 AM  CMP  Glucose 70 - 99 mg/dL 811     BUN 8 - 23 mg/dL 18     Creatinine 9.14 - 1.00 mg/dL 7.82     Sodium 956 - 213 mmol/L 135  138  138    138   Potassium 3.5 - 5.1 mmol/L 4.2  3.6  3.7    3.6   Chloride 98 - 111 mmol/L 98     CO2 22 - 32 mmol/L 25     Calcium 8.9 - 10.3 mg/dL 9.8       Renal function Estimated Creatinine Clearance: 55.1 mL/min (by C-G formula based on SCr of 0.72 mg/dL).  Hgb A1c MFr Bld (%)  Date Value  03/21/2020 5.9 (H)    LDL (calc)  Date Value Ref Range Status  12/21/2012 93 <100 mg/dL Final    Comment:    LDL-C is inaccurate if patient is nonfasting.   Reference Range: ---------------- Optimal:            <100 Near/Above Optimal: 100-129 Borderline High:    130-159 High:               160-189 Very High:          >=190       LDLC SERPL CALC-[facility]  Date Value Ref Range Status  07/06/2014 102 (H) 0 - 99 mg/dL Final    Comment:  Optimal               <  100                           Above optimal     100 -  129                           Borderline        130 -  159                           High              160 -  189                           Very high             >  189 **Effective July 17, 2014 the reference interval**   for LDL-C will be changing to:                          0 - 19 years         0 - 109                             >19 years         0 -  99 LDL-C is inaccurate if patient is non-fasting.   LDL Chol Calc (NIH)  Date Value Ref Range Status  03/01/2020 94 0 - 99 mg/dL Final   LDL Cholesterol  Date Value Ref Range Status  01/17/2021 53 0 - 99 mg/dL Final    Comment:           Total Cholesterol/HDL:CHD Risk Coronary Heart Disease Risk Table                     Men    Women  1/2 Average Risk   3.4   3.3  Average Risk       5.0   4.4  2 X Average Risk   9.6   7.1  3 X Average Risk  23.4   11.0        Use the calculated Patient Ratio above and the CHD Risk Table to determine the patient's CHD Risk.        ATP III CLASSIFICATION (LDL):  <100     mg/dL   Optimal  161-096  mg/dL   Near or Above                    Optimal  130-159  mg/dL   Borderline  045-409  mg/dL   High  >811     mg/dL   Very High Performed at [facility], [address], [place], [place] [address]    LDL-C  Date Value Ref Range Status  12/26/2014 96 0 - 99 mg/dL Final    Comment:                              Optimal               <  100  Above optimal     100 -  129                           Borderline        130 -  159                           High              160 -  189                           Very high             >  189 LDL-C is inaccurate if patient is non-fasting.     Rande Brunt. Lenell Antu, MD FACS [facility] Phone Number: [phone] 03/10/2023 7:41 AM   Total time spent on preparing this encounter including chart review, data review, collecting history, examining the patient, coordinating care for this Patient, 40 minutes.  Portions of this report may have been transcribed using voice recognition software.  Every effort has been made to ensure accuracy; however, inadvertent computerized transcription errors may still be present.

## 2023-03-12 ENCOUNTER — Other Ambulatory Visit: Payer: Self-pay

## 2023-03-12 ENCOUNTER — Encounter (HOSPITAL_COMMUNITY): Payer: Self-pay | Admitting: Vascular Surgery

## 2023-03-12 DIAGNOSIS — Z79899 Other long term (current) drug therapy: Secondary | ICD-10-CM | POA: Diagnosis not present

## 2023-03-12 DIAGNOSIS — Z940 Kidney transplant status: Secondary | ICD-10-CM | POA: Diagnosis not present

## 2023-03-12 NOTE — Progress Notes (Signed)
I spoke with Jasmine White on speaker phone with her husband listening.  Jasmine White denies chest pain or shortness of breath. Patient denies having any s/s of Covid in her household, also denies any known exposure to Covid.  Jasmine White denies  any s/s of upper or lower respiratory in the past 8 weeks.   Jasmine White' PCP is Dr. Soyla Murphy, cardiologist is Dr. Excell Seltzer.

## 2023-03-16 ENCOUNTER — Ambulatory Visit (HOSPITAL_COMMUNITY)
Admission: RE | Admit: 2023-03-16 | Discharge: 2023-03-16 | Disposition: A | Discharge status: Home / Self Care | Payer: Medicare PPO | Attending: Vascular Surgery | Admitting: Vascular Surgery

## 2023-03-16 ENCOUNTER — Other Ambulatory Visit: Payer: Self-pay

## 2023-03-16 ENCOUNTER — Ambulatory Visit (HOSPITAL_BASED_OUTPATIENT_CLINIC_OR_DEPARTMENT_OTHER): Payer: Medicare PPO | Admitting: Anesthesiology

## 2023-03-16 ENCOUNTER — Encounter (HOSPITAL_COMMUNITY)
Admission: RE | Disposition: A | Discharge status: Home / Self Care | Payer: Self-pay | Source: Home / Self Care | Attending: Vascular Surgery

## 2023-03-16 ENCOUNTER — Ambulatory Visit (HOSPITAL_COMMUNITY): Payer: Medicare PPO | Admitting: Anesthesiology

## 2023-03-16 ENCOUNTER — Other Ambulatory Visit (HOSPITAL_COMMUNITY): Payer: Self-pay

## 2023-03-16 ENCOUNTER — Encounter (HOSPITAL_COMMUNITY): Payer: Self-pay | Admitting: Vascular Surgery

## 2023-03-16 DIAGNOSIS — Z452 Encounter for adjustment and management of vascular access device: Secondary | ICD-10-CM | POA: Diagnosis not present

## 2023-03-16 DIAGNOSIS — Z4822 Encounter for aftercare following kidney transplant: Secondary | ICD-10-CM | POA: Insufficient documentation

## 2023-03-16 DIAGNOSIS — Z992 Dependence on renal dialysis: Secondary | ICD-10-CM | POA: Diagnosis not present

## 2023-03-16 DIAGNOSIS — I5030 Unspecified diastolic (congestive) heart failure: Secondary | ICD-10-CM | POA: Insufficient documentation

## 2023-03-16 DIAGNOSIS — N185 Chronic kidney disease, stage 5: Secondary | ICD-10-CM | POA: Diagnosis not present

## 2023-03-16 DIAGNOSIS — E039 Hypothyroidism, unspecified: Secondary | ICD-10-CM | POA: Insufficient documentation

## 2023-03-16 DIAGNOSIS — I110 Hypertensive heart disease with heart failure: Secondary | ICD-10-CM | POA: Diagnosis not present

## 2023-03-16 DIAGNOSIS — I509 Heart failure, unspecified: Secondary | ICD-10-CM

## 2023-03-16 DIAGNOSIS — X58XXXD Exposure to other specified factors, subsequent encounter: Secondary | ICD-10-CM | POA: Insufficient documentation

## 2023-03-16 DIAGNOSIS — Z940 Kidney transplant status: Secondary | ICD-10-CM | POA: Insufficient documentation

## 2023-03-16 DIAGNOSIS — I080 Rheumatic disorders of both mitral and aortic valves: Secondary | ICD-10-CM

## 2023-03-16 DIAGNOSIS — S42351D Displaced comminuted fracture of shaft of humerus, right arm, subsequent encounter for fracture with routine healing: Secondary | ICD-10-CM | POA: Diagnosis not present

## 2023-03-16 HISTORY — DX: Cardiac murmur, unspecified: R01.1

## 2023-03-16 HISTORY — DX: Depression, unspecified: F32.A

## 2023-03-16 HISTORY — PX: AVGG REMOVAL: SHX5153

## 2023-03-16 HISTORY — DX: Chronic kidney disease, unspecified: N18.9

## 2023-03-16 LAB — POCT I-STAT, CHEM 8
BUN: 25 mg/dL — ABNORMAL HIGH (ref 8–23)
Calcium, Ion: 1.27 mmol/L (ref 1.15–1.40)
Chloride: 103 mmol/L (ref 98–111)
Creatinine, Ser: 0.7 mg/dL (ref 0.44–1.00)
Glucose, Bld: 110 mg/dL — ABNORMAL HIGH (ref 70–99)
HCT: 36 % (ref 36.0–46.0)
Hemoglobin: 12.2 g/dL (ref 12.0–15.0)
Potassium: 3.7 mmol/L (ref 3.5–5.1)
Sodium: 137 mmol/L (ref 135–145)
TCO2: 27 mmol/L (ref 22–32)

## 2023-03-16 SURGERY — REMOVAL OF ARTERIOVENOUS GORETEX GRAFT (AVGG)
Anesthesia: Monitor Anesthesia Care | Site: Arm Upper | Laterality: Left

## 2023-03-16 MED ORDER — CEFAZOLIN SODIUM-DEXTROSE 2-3 GM-%(50ML) IV SOLR
INTRAVENOUS | Status: DC | PRN
  Administered 2023-03-16: 2 g via INTRAVENOUS

## 2023-03-16 MED ORDER — NOREPINEPHRINE 4 MG/250ML-% IV SOLN
INTRAVENOUS | Status: AC
  Filled 2023-03-16: qty 250

## 2023-03-16 MED ORDER — PROPOFOL 500 MG/50ML IV EMUL
INTRAVENOUS | Status: DC | PRN
  Administered 2023-03-16: 75 ug/kg/min via INTRAVENOUS

## 2023-03-16 MED ORDER — EPHEDRINE 5 MG/ML INJ
INTRAVENOUS | Status: AC
  Filled 2023-03-16: qty 5

## 2023-03-16 MED ORDER — NOREPINEPHRINE 4 MG/250ML-% IV SOLN
INTRAVENOUS | Status: DC | PRN
  Administered 2023-03-16: 2 ug/min via INTRAVENOUS

## 2023-03-16 MED ORDER — PHENYLEPHRINE 80 MCG/ML (10ML) SYRINGE FOR IV PUSH (FOR BLOOD PRESSURE SUPPORT)
PREFILLED_SYRINGE | INTRAVENOUS | Status: AC
  Filled 2023-03-16: qty 10

## 2023-03-16 MED ORDER — ORAL CARE MOUTH RINSE: 15.0000 mL | Freq: Once | OROMUCOSAL | Status: AC

## 2023-03-16 MED ORDER — ONDANSETRON HCL 4 MG/2ML IJ SOLN
INTRAMUSCULAR | Status: DC | PRN
  Administered 2023-03-16: 4 mg via INTRAVENOUS

## 2023-03-16 MED ORDER — ACETAMINOPHEN 500 MG PO TABS
1000.0000 mg | ORAL_TABLET | Freq: Once | ORAL | Status: AC
  Administered 2023-03-16: 1000 mg via ORAL
  Filled 2023-03-16: qty 2

## 2023-03-16 MED ORDER — LIDOCAINE-EPINEPHRINE (PF) 1 %-1:200000 IJ SOLN
INTRAMUSCULAR | Status: AC
  Filled 2023-03-16: qty 30

## 2023-03-16 MED ORDER — LACTATED RINGERS IV SOLN: INTRAVENOUS | Status: DC

## 2023-03-16 MED ORDER — CHLORHEXIDINE GLUCONATE 4 % EX SOLN: 60.0000 mL | Freq: Once | CUTANEOUS | Status: DC

## 2023-03-16 MED ORDER — STERILE WATER FOR IRRIGATION IR SOLN
Status: DC | PRN
  Administered 2023-03-16: 1000 mL

## 2023-03-16 MED ORDER — HEPARIN 6000 UNIT IRRIGATION SOLUTION
Status: AC
  Filled 2023-03-16: qty 500

## 2023-03-16 MED ORDER — CHLORHEXIDINE GLUCONATE 0.12 % MT SOLN
OROMUCOSAL | Status: AC
  Administered 2023-03-16: 15 mL via OROMUCOSAL
  Filled 2023-03-16: qty 15

## 2023-03-16 MED ORDER — FENTANYL CITRATE (PF) 250 MCG/5ML IJ SOLN
INTRAMUSCULAR | Status: AC
  Filled 2023-03-16: qty 5

## 2023-03-16 MED ORDER — HEPARIN 6000 UNIT IRRIGATION SOLUTION
Status: DC | PRN
  Administered 2023-03-16: 1

## 2023-03-16 MED ORDER — HYDROCODONE-ACETAMINOPHEN 5-325 MG PO TABS
1.0000 | ORAL_TABLET | ORAL | 0 refills | Status: AC | PRN
  Filled 2023-03-16: qty 12, 2d supply, fill #0

## 2023-03-16 MED ORDER — FENTANYL CITRATE (PF) 250 MCG/5ML IJ SOLN
INTRAMUSCULAR | Status: DC | PRN
  Administered 2023-03-16: 50 ug via INTRAVENOUS

## 2023-03-16 MED ORDER — SODIUM CHLORIDE 0.9 % IV SOLN: INTRAVENOUS | Status: DC

## 2023-03-16 MED ORDER — 0.9 % SODIUM CHLORIDE (POUR BTL) OPTIME
TOPICAL | Status: DC | PRN
  Administered 2023-03-16: 1000 mL

## 2023-03-16 MED ORDER — CEFAZOLIN SODIUM-DEXTROSE 2-4 GM/100ML-% IV SOLN
2.0000 g | INTRAVENOUS | Status: DC
  Filled 2023-03-16: qty 100

## 2023-03-16 MED ORDER — LIDOCAINE-EPINEPHRINE 2 %-1:100000 IJ SOLN
INTRAMUSCULAR | Status: DC | PRN
  Administered 2023-03-16: 20 mL via PERINEURAL

## 2023-03-16 MED ORDER — CHLORHEXIDINE GLUCONATE 0.12 % MT SOLN: 15.0000 mL | Freq: Once | OROMUCOSAL | Status: AC

## 2023-03-16 SURGICAL SUPPLY — 38 items
APL PRP STRL LF DISP 70% ISPRP (MISCELLANEOUS) ×1
APL SKNCLS STERI-STRIP NONHPOA (GAUZE/BANDAGES/DRESSINGS) ×1
ARMBAND PINK RESTRICT EXTREMIT (MISCELLANEOUS) ×2 IMPLANT
BAG COUNTER SPONGE SURGICOUNT (BAG) ×1 IMPLANT
BAG SPNG CNTER NS LX DISP (BAG) ×1
BENZOIN TINCTURE PRP APPL 2/3 (GAUZE/BANDAGES/DRESSINGS) ×1 IMPLANT
BNDG CMPR 5X4 KNIT ELC UNQ LF (GAUZE/BANDAGES/DRESSINGS) ×1
BNDG CMPR 5X62 HK CLSR LF (GAUZE/BANDAGES/DRESSINGS) ×1
BNDG ELASTIC 4INX 5YD STR LF (GAUZE/BANDAGES/DRESSINGS) IMPLANT
BNDG ELASTIC 6INX 5YD STR LF (GAUZE/BANDAGES/DRESSINGS) IMPLANT
BNDG GAUZE DERMACEA FLUFF 4 (GAUZE/BANDAGES/DRESSINGS) IMPLANT
BNDG GZE DERMACEA 4 6PLY (GAUZE/BANDAGES/DRESSINGS) ×1
CANISTER SUCT 3000ML PPV (MISCELLANEOUS) ×1 IMPLANT
CHLORAPREP W/TINT 26 (MISCELLANEOUS) ×1 IMPLANT
CLSR STERI-STRIP ANTIMIC 1/2X4 (GAUZE/BANDAGES/DRESSINGS) IMPLANT
DRSG COVADERM 4X6 (GAUZE/BANDAGES/DRESSINGS) IMPLANT
ELECT REM PT RETURN 9FT ADLT (ELECTROSURGICAL) ×1
ELECTRODE REM PT RTRN 9FT ADLT (ELECTROSURGICAL) ×1 IMPLANT
GAUZE SPONGE 4X4 12PLY STRL (GAUZE/BANDAGES/DRESSINGS) IMPLANT
GLOVE BIO SURGEON STRL SZ8 (GLOVE) ×1 IMPLANT
GLOVE BIOGEL PI IND STRL 6.5 (GLOVE) IMPLANT
GOWN STRL REUS W/ TWL LRG LVL3 (GOWN DISPOSABLE) ×2 IMPLANT
GOWN STRL REUS W/ TWL XL LVL3 (GOWN DISPOSABLE) ×1 IMPLANT
GOWN STRL REUS W/TWL LRG LVL3 (GOWN DISPOSABLE) ×2
GOWN STRL REUS W/TWL XL LVL3 (GOWN DISPOSABLE) ×1
KIT BASIN OR (CUSTOM PROCEDURE TRAY) ×1 IMPLANT
KIT TURNOVER KIT B (KITS) ×1 IMPLANT
NS IRRIG 1000ML POUR BTL (IV SOLUTION) ×1 IMPLANT
PACK CV ACCESS (CUSTOM PROCEDURE TRAY) ×1 IMPLANT
PAD ARMBOARD 7.5X6 YLW CONV (MISCELLANEOUS) ×2 IMPLANT
STRIP CLOSURE SKIN 1/2X4 (GAUZE/BANDAGES/DRESSINGS) ×1 IMPLANT
SUT MNCRL AB 4-0 PS2 18 (SUTURE) IMPLANT
SUT PROLENE 6 0 BV (SUTURE) ×2 IMPLANT
SUT VIC AB 3-0 SH 27 (SUTURE) ×1
SUT VIC AB 3-0 SH 27X BRD (SUTURE) ×2 IMPLANT
TOWEL GREEN STERILE (TOWEL DISPOSABLE) ×1 IMPLANT
UNDERPAD 30X36 HEAVY ABSORB (UNDERPADS AND DIAPERS) ×1 IMPLANT
WATER STERILE IRR 1000ML POUR (IV SOLUTION) ×1 IMPLANT

## 2023-03-16 NOTE — Transfer of Care (Signed)
Immediate Anesthesia Transfer of Care Note  Patient: Jasmine White  Procedure(s) Performed: EXCISION OF LEFT ARM ARTERIOVENOUS FISTULA (Left: Arm Upper)  Patient Location: PACU  Anesthesia Type:MAC combined with regional for post-op pain  Level of Consciousness: awake, alert , and oriented  Airway & Oxygen Therapy: Patient Spontanous Breathing  Post-op Assessment: Report given to RN and Post -op Vital signs reviewed and stable  Post vital signs: Reviewed and stable  Last Vitals:  Vitals Value Taken Time  BP 135/72 03/16/23 0851  Temp    Pulse 82 03/16/23 0855  Resp 24 03/16/23 0855  SpO2 100 % 03/16/23 0855  Vitals shown include unvalidated device data.  Last Pain:  Vitals:   03/16/23 0615  TempSrc:   PainSc: 3          Complications: No notable events documented.

## 2023-03-16 NOTE — Anesthesia Postprocedure Evaluation (Signed)
Anesthesia Post Note  Patient: Jasmine White  Procedure(s) Performed: EXCISION OF LEFT ARM ARTERIOVENOUS FISTULA (Left: Arm Upper)     Patient location during evaluation: PACU Anesthesia Type: Regional and MAC Level of consciousness: awake and alert Pain management: pain level controlled Vital Signs Assessment: post-procedure vital signs reviewed and stable Respiratory status: spontaneous breathing, nonlabored ventilation and respiratory function stable Cardiovascular status: blood pressure returned to baseline and stable Postop Assessment: no apparent nausea or vomiting Anesthetic complications: no   No notable events documented.  Last Vitals:  Vitals:   03/16/23 0850 03/16/23 0900  BP: 135/72 (!) 151/57  Pulse: 79 80  Resp: (!) 29 (!) 24  Temp: 36.6 C   SpO2: 94% 100%    Last Pain:  Vitals:   03/16/23 0850  TempSrc:   PainSc: 0-No pain                 Lannie Fields

## 2023-03-16 NOTE — Op Note (Signed)
DATE OF SERVICE: 03/16/2023  PATIENT:  Jasmine White  77 y.o. female  PRE-OPERATIVE DIAGNOSIS:  successful kidney transplant, patient desires excision of fistula  POST-OPERATIVE DIAGNOSIS:  Same  PROCEDURE:   Excision of left arm arteriovenous fistula  SURGEON:  Surgeon(s) and Role:    * Leonie Douglas, MD - Primary  ASSISTANT: Doreatha Massed, PA-C  An experienced assistant was required given the complexity of this procedure and the standard of surgical care. My assistant helped with exposure through counter tension, suctioning, ligation and retraction to better visualize the surgical field.  My assistant expedited sewing during the case by following my sutures. Wherever I use the term "we" in the report, my assistant actively helped me with that portion of the procedure.  ANESTHESIA:   regional and MAC  EBL: minimal  BLOOD ADMINISTERED:none  DRAINS: none   LOCAL MEDICATIONS USED:  NONE  SPECIMEN:  none  COUNTS: confirmed correct.  TOURNIQUET:  none  PATIENT DISPOSITION:  PACU - hemodynamically stable.   Delay start of Pharmacological VTE agent (>24hrs) due to surgical blood loss or risk of bleeding: no  INDICATION FOR PROCEDURE: Jasmine White is a 77 y.o. female with ESRD status post successful kidney transplant. She was previously dialyzing via left arm brachiocephalic arteriovenous fistula. The patient desired excision. After careful discussion of risks, benefits, and alternatives the patient was offered excision of the fistula. The patient understood and wished to proceed.  OPERATIVE FINDINGS: Successful excision of left arm arteriovenous fistula.,  DESCRIPTION OF PROCEDURE: After identification of the patient in the pre-operative holding area, the patient was transferred to the operating room. The patient was positioned supine on the operating room table. Anesthesia was induced. The left arm was prepped and draped in standard fashion. A surgical pause was performed  confirming correct patient, procedure, and operative location.  Two "skip" incisions were made over the course of the left brachiocephalic fistula in the upper arm.  The fistula was skeletonized from about a centimeter distal to its anastomosis to the deltoid.  The fistula was densely adherent to the skin in the midportion.  Clamps were applied to the inflow and outflow to excise this portion of fistula.  Ultimately the fistula was completely skeletonized.  The fistula was transected and removed from the field.  The inflow was oversewn with a 2 layer closure of 6-0 Prolene.  The outflow was oversewn with a 2 layer closure of 6-0 Prolene.  Good hemostasis was achieved.  The wound was irrigated.  The wound was closed in layers using 3-0 Vicryl and 4-0 Monocryl.  Upon completion of the case instrument and sharps counts were confirmed correct. The patient was transferred to the PACU in good condition. I was present for all portions of the procedure.  FOLLOW UP PLAN: Assuming a normal postoperative course, I or one of the [facility] PAs will see the patient in 2-3 weeks with no studies for a wound check.   Jasmine Brunt. Lenell Antu, MD [facility] Phone Number: [phone] 03/16/2023 8:41 AM

## 2023-03-16 NOTE — Anesthesia Preprocedure Evaluation (Addendum)
Anesthesia Evaluation  Patient identified by MRN, date of birth, ID band Patient awake    Reviewed: Allergy & Precautions, NPO status , Patient's Chart, lab work & pertinent test results  History of Anesthesia Complications (+) PONV and history of anesthetic complications (remote hx)  Airway Mallampati: III  TM Distance: >3 FB Neck ROM: Full    Dental no notable dental hx. (+) Dental Advisory Given, Teeth Intact   Pulmonary neg pulmonary ROS   Pulmonary exam normal breath sounds clear to auscultation       Cardiovascular hypertension (160/69 preop, no home meds- normally 130s SBP, prior to R arm surgery was lower than this), +CHF (LVEF 45-50%, grade 1 diastolic dysfunction)  Normal cardiovascular exam+ Valvular Problems/Murmurs (mod MR, mod AI) MR and AI  Rhythm:Regular Rate:Normal  Echo 2023  1. Left ventricular ejection fraction, by estimation, is 45 to 50%. Left  ventricular ejection fraction by 2D MOD biplane is 49.6 %. The left  ventricle has mildly decreased function. The left ventricle has no  regional wall motion abnormalities. There is  mild left ventricular hypertrophy. Left ventricular diastolic parameters  are consistent with Grade I diastolic dysfunction (impaired relaxation).   2. Right ventricular systolic function is normal. The right ventricular  size is normal. Tricuspid regurgitation signal is inadequate for assessing  PA pressure.   3. The mitral valve is abnormal. Moderate mitral valve regurgitation.   4. The aortic valve is tricuspid. Aortic valve regurgitation is moderate.  Aortic valve sclerosis is present, with no evidence of aortic valve  stenosis. Aortic regurgitation PHT measures 341 msec.   5. Aortic dilatation noted. There is mild dilatation of the ascending  aorta, measuring 40 mm.   6. The inferior vena cava is normal in size with greater than 50%  respiratory variability, suggesting right atrial  pressure of 3 mmHg.     Neuro/Psych  PSYCHIATRIC DISORDERS  Depression    negative neurological ROS     GI/Hepatic negative GI ROS, Neg liver ROS,,,  Endo/Other  Hypothyroidism    Renal/GU Renal diseaseKidney transplant in November, now removing fistula  negative genitourinary   Musculoskeletal  (+) Arthritis , Osteoarthritis,    Abdominal   Peds  Hematology negative hematology ROS (+) Hb 12.2, plt 155   Anesthesia Other Findings   Reproductive/Obstetrics negative OB ROS                             Anesthesia Physical Anesthesia Plan  ASA: 3  Anesthesia Plan: MAC and Regional   Post-op Pain Management: Tylenol PO (pre-op)*   Induction:   PONV Risk Score and Plan: 2 and Propofol infusion and TIVA  Airway Management Planned: Natural Airway and Simple Face Mask  Additional Equipment: None  Intra-op Plan:   Post-operative Plan:   Informed Consent: I have reviewed the patients History and Physical, chart, labs and discussed the procedure including the risks, benefits and alternatives for the proposed anesthesia with the patient or authorized representative who has indicated his/her understanding and acceptance.       Plan Discussed with: CRNA  Anesthesia Plan Comments: (Operative arm L arm and R am recent fx now casted. Will need foot PIV, nondiabetic. )        Anesthesia Quick Evaluation

## 2023-03-16 NOTE — Anesthesia Procedure Notes (Signed)
Procedure Name: MAC Date/Time: 03/16/2023 7:36 AM  Performed by: Marena Chancy, CRNAPre-anesthesia Checklist: Patient identified, Emergency Drugs available, Suction available, Patient being monitored and Timeout performed Patient Re-evaluated:Patient Re-evaluated prior to induction Oxygen Delivery Method: Simple face mask

## 2023-03-16 NOTE — Progress Notes (Signed)
Orthopedic Tech Progress Note Patient Details:  NOLIE BIGNELL 02-25-1946 [id number] PACU RN called for arm sling. Left at bedside because patient needs to be dressed.   Ortho Devices Type of Ortho Device: Arm sling Ortho Device/Splint Location: RUE Ortho Device/Splint Interventions: Ordered   Post Interventions Patient Tolerated: Well Instructions Provided: Care of device  Blase Mess 03/16/2023, 9:13 AM

## 2023-03-16 NOTE — Interval H&P Note (Signed)
History and Physical Interval Note:  03/16/2023 7:23 AM  Jasmine White  has presented today for surgery, with the diagnosis of History of kidney transplant.  The various methods of treatment have been discussed with the patient and family. After consideration of risks, benefits and other options for treatment, the patient has consented to  Procedure(s): EXCISION OF LEFT ARM ARTERIOVENOUS FISTULA (Left) as a surgical intervention.  The patient's history has been reviewed, patient examined, no change in status, stable for surgery.  I have reviewed the patient's chart and labs.  Questions were answered to the patient's satisfaction.     Leonie Douglas

## 2023-03-16 NOTE — Anesthesia Procedure Notes (Signed)
Anesthesia Regional Block: Supraclavicular block   Pre-Anesthetic Checklist: , timeout performed,  Correct Patient, Correct Site, Correct Laterality,  Correct Procedure, Correct Position, site marked,  Risks and benefits discussed,  Surgical consent,  Pre-op evaluation,  At surgeon's request and post-op pain management  Laterality: Left  Prep: Maximum Sterile Barrier Precautions used, chloraprep       Needles:  Injection technique: Single-shot  Needle Type: Echogenic Stimulator Needle     Needle Length: 9cm  Needle Gauge: 22     Additional Needles:   Procedures:,,,, ultrasound used (permanent image in chart),,    Narrative:  Start time: 03/16/2023 7:25 AM End time: 03/16/2023 7:30 AM Injection made incrementally with aspirations every 5 mL.  Performed by: Personally  Anesthesiologist: Lannie Fields, DO  Additional Notes: Monitors applied. No increased pain on injection. No increased resistance to injection. Injection made in 5cc increments. Good needle visualization. Patient tolerated procedure well.

## 2023-03-17 ENCOUNTER — Encounter (HOSPITAL_COMMUNITY): Payer: Self-pay | Admitting: Vascular Surgery

## 2023-03-18 DIAGNOSIS — S42301D Unspecified fracture of shaft of humerus, right arm, subsequent encounter for fracture with routine healing: Secondary | ICD-10-CM | POA: Diagnosis not present

## 2023-03-18 DIAGNOSIS — Z4789 Encounter for other orthopedic aftercare: Secondary | ICD-10-CM | POA: Diagnosis not present

## 2023-03-18 DIAGNOSIS — M25521 Pain in right elbow: Secondary | ICD-10-CM | POA: Diagnosis not present

## 2023-03-25 DIAGNOSIS — Z79899 Other long term (current) drug therapy: Secondary | ICD-10-CM | POA: Diagnosis not present

## 2023-03-25 DIAGNOSIS — Z940 Kidney transplant status: Secondary | ICD-10-CM | POA: Diagnosis not present

## 2023-03-31 ENCOUNTER — Ambulatory Visit (INDEPENDENT_AMBULATORY_CARE_PROVIDER_SITE_OTHER): Payer: Medicare PPO | Admitting: Physician Assistant

## 2023-03-31 VITALS — BP 127/69 | HR 93 | Temp 97.9°F | Resp 18 | Ht 66.0 in | Wt 151.3 lb

## 2023-03-31 DIAGNOSIS — Z9889 Other specified postprocedural states: Secondary | ICD-10-CM

## 2023-03-31 NOTE — Progress Notes (Signed)
POST OPERATIVE OFFICE NOTE    CC:  F/u for surgery  HPI:  Jasmine White is a 77 y.o. female who is s/p excision of left upper arm fistula on 03/16/2023 by Dr. Lenell Antu.  The patient no longer needs her fistula.  She got a kidney transplant in November 2023.  Pt returns today for follow up.  Pt states she has been doing well since surgery.  She denies any pain or numbness in her left upper extremity.  She has been keeping her incisions clean and dry.  She denies any drainage or erythema of her incisions.  She states her left hand has felt much warmer since fistula excision.   Allergies  Allergen Reactions   Hydrochlorothiazide Other (See Comments)    Hx of severe Hyponatremia while taking HCTZ - now contraindicated Other reaction(s): low sodium, Other (See Comments) Hx of severe Hyponatremia while taking HCTZ - now contraindicated Hx of severe Hyponatremia while taking HCTZ - now contraindicated    Nsaids Other (See Comments)    Renal patient   Other Other (See Comments)    If ever given "strong" pain medication, the patient will require something to counteract the nausea   Oxycodone Other (See Comments)   Percocet [Oxycodone-Acetaminophen] Nausea Only   Tramadol Nausea Only and Other (See Comments)    Patient remarked this made her "feel crazy" (also) Other reaction(s): nausea, Nausea And Vomiting, Nausea Only Patient remarked this made her "feel crazy" (also)     Current Outpatient Medications  Medication Sig Dispense Refill   acetaminophen (TYLENOL) 325 MG tablet Take 650 mg by mouth every 4 (four) hours as needed.     albuterol (VENTOLIN HFA) 108 (90 Base) MCG/ACT inhaler Inhale 2 puffs into the lungs every 6 (six) hours as needed for wheezing or shortness of breath.     Ascorbic Acid (VITAMIN C) 1000 MG tablet Take 1,000 mg by mouth daily.     aspirin EC 81 MG tablet Take 81 mg by mouth daily.     cephALEXin (KEFLEX) 500 MG capsule Take 500 mg by mouth 4 (four) times daily.      escitalopram (LEXAPRO) 20 MG tablet Take 20 mg by mouth at bedtime.     HYDROcodone-acetaminophen (NORCO/VICODIN) 5-325 MG tablet Take 1 tablet by mouth every 4 (four) hours as needed for moderate pain or severe pain. 12 tablet 0   loratadine (CLARITIN) 10 MG tablet Take 10 mg by mouth daily.     mycophenolate (CELLCEPT) 500 MG tablet Take 1,000 mg by mouth 2 (two) times daily.     predniSONE (DELTASONE) 5 MG tablet Take 5 mg by mouth daily with breakfast.     sulfamethoxazole-trimethoprim (BACTRIM) 400-80 MG tablet Take 1 tablet by mouth daily.     SYNTHROID 88 MCG tablet TAKE 1 TABLET BY MOUTH ONCE DAILY BEFORE BREAKFAST (Patient taking differently: Take 88 mcg by mouth daily before breakfast.) 90 tablet 1   tacrolimus (PROGRAF) 1 MG capsule Take 2 mg by mouth 2 (two) times daily.     ZOFRAN 4 MG tablet Take 4 mg by mouth every 8 (eight) hours as needed for nausea or vomiting.     No current facility-administered medications for this visit.     ROS:  See HPI  Physical Exam:   Incision: Left upper arm incisions intact without dehiscence, infection, or drainage Extremities: Palpable left radial pulse Neuro: Intact motor and sensation of left upper extremity    Assessment/Plan:  This is a 77 y.o. female  who is s/p: Excision of left upper arm fistula  -Her left upper arm incisions are intact and dry.  There are no signs of infection, dehiscence, or drainage -She has intact motor and sensation of the left upper extremity.  She has a palpable left radial pulse. -She no longer needs dialysis access since she got a kidney transplant last year. -She can follow-up with our office as needed   Loel Dubonnet, PA-C [facility] [phone]   Clinic MD:  Steve Rattler

## 2023-04-02 DIAGNOSIS — M25521 Pain in right elbow: Secondary | ICD-10-CM | POA: Diagnosis not present

## 2023-04-07 DIAGNOSIS — M25521 Pain in right elbow: Secondary | ICD-10-CM | POA: Diagnosis not present

## 2023-04-09 DIAGNOSIS — D849 Immunodeficiency, unspecified: Secondary | ICD-10-CM | POA: Diagnosis not present

## 2023-04-09 DIAGNOSIS — Z940 Kidney transplant status: Secondary | ICD-10-CM | POA: Diagnosis not present

## 2023-04-09 DIAGNOSIS — Z4789 Encounter for other orthopedic aftercare: Secondary | ICD-10-CM | POA: Diagnosis not present

## 2023-04-09 DIAGNOSIS — M25521 Pain in right elbow: Secondary | ICD-10-CM | POA: Diagnosis not present

## 2023-04-09 DIAGNOSIS — E039 Hypothyroidism, unspecified: Secondary | ICD-10-CM | POA: Diagnosis not present

## 2023-04-09 DIAGNOSIS — I959 Hypotension, unspecified: Secondary | ICD-10-CM | POA: Diagnosis not present

## 2023-04-09 DIAGNOSIS — N2581 Secondary hyperparathyroidism of renal origin: Secondary | ICD-10-CM | POA: Diagnosis not present

## 2023-04-09 DIAGNOSIS — Z792 Long term (current) use of antibiotics: Secondary | ICD-10-CM | POA: Diagnosis not present

## 2023-04-14 DIAGNOSIS — M25521 Pain in right elbow: Secondary | ICD-10-CM | POA: Diagnosis not present

## 2023-04-16 DIAGNOSIS — M25521 Pain in right elbow: Secondary | ICD-10-CM | POA: Diagnosis not present

## 2023-04-20 DIAGNOSIS — D485 Neoplasm of uncertain behavior of skin: Secondary | ICD-10-CM | POA: Diagnosis not present

## 2023-04-20 DIAGNOSIS — D692 Other nonthrombocytopenic purpura: Secondary | ICD-10-CM | POA: Diagnosis not present

## 2023-04-20 DIAGNOSIS — L570 Actinic keratosis: Secondary | ICD-10-CM | POA: Diagnosis not present

## 2023-04-20 DIAGNOSIS — Z85828 Personal history of other malignant neoplasm of skin: Secondary | ICD-10-CM | POA: Diagnosis not present

## 2023-04-20 DIAGNOSIS — C44321 Squamous cell carcinoma of skin of nose: Secondary | ICD-10-CM | POA: Diagnosis not present

## 2023-04-20 DIAGNOSIS — D225 Melanocytic nevi of trunk: Secondary | ICD-10-CM | POA: Diagnosis not present

## 2023-04-20 DIAGNOSIS — C44719 Basal cell carcinoma of skin of left lower limb, including hip: Secondary | ICD-10-CM | POA: Diagnosis not present

## 2023-04-20 DIAGNOSIS — H61002 Unspecified perichondritis of left external ear: Secondary | ICD-10-CM | POA: Diagnosis not present

## 2023-04-21 DIAGNOSIS — M25521 Pain in right elbow: Secondary | ICD-10-CM | POA: Diagnosis not present

## 2023-04-23 DIAGNOSIS — Z940 Kidney transplant status: Secondary | ICD-10-CM | POA: Diagnosis not present

## 2023-04-23 DIAGNOSIS — M25521 Pain in right elbow: Secondary | ICD-10-CM | POA: Diagnosis not present

## 2023-04-23 DIAGNOSIS — Z79899 Other long term (current) drug therapy: Secondary | ICD-10-CM | POA: Diagnosis not present

## 2023-04-28 DIAGNOSIS — M25521 Pain in right elbow: Secondary | ICD-10-CM | POA: Diagnosis not present

## 2023-04-30 DIAGNOSIS — M25521 Pain in right elbow: Secondary | ICD-10-CM | POA: Diagnosis not present

## 2023-05-04 DIAGNOSIS — Z4789 Encounter for other orthopedic aftercare: Secondary | ICD-10-CM | POA: Diagnosis not present

## 2023-05-04 DIAGNOSIS — M25521 Pain in right elbow: Secondary | ICD-10-CM | POA: Diagnosis not present

## 2023-05-07 DIAGNOSIS — Z79899 Other long term (current) drug therapy: Secondary | ICD-10-CM | POA: Diagnosis not present

## 2023-05-07 DIAGNOSIS — Z940 Kidney transplant status: Secondary | ICD-10-CM | POA: Diagnosis not present

## 2023-05-07 DIAGNOSIS — M25521 Pain in right elbow: Secondary | ICD-10-CM | POA: Diagnosis not present

## 2023-05-12 DIAGNOSIS — M25521 Pain in right elbow: Secondary | ICD-10-CM | POA: Diagnosis not present

## 2023-05-14 DIAGNOSIS — M25521 Pain in right elbow: Secondary | ICD-10-CM | POA: Diagnosis not present

## 2023-05-20 DIAGNOSIS — Z792 Long term (current) use of antibiotics: Secondary | ICD-10-CM | POA: Diagnosis not present

## 2023-05-20 DIAGNOSIS — Z940 Kidney transplant status: Secondary | ICD-10-CM | POA: Diagnosis not present

## 2023-05-20 DIAGNOSIS — N2581 Secondary hyperparathyroidism of renal origin: Secondary | ICD-10-CM | POA: Diagnosis not present

## 2023-05-21 DIAGNOSIS — F3341 Major depressive disorder, recurrent, in partial remission: Secondary | ICD-10-CM | POA: Diagnosis not present

## 2023-05-21 DIAGNOSIS — G894 Chronic pain syndrome: Secondary | ICD-10-CM | POA: Diagnosis not present

## 2023-05-21 DIAGNOSIS — E039 Hypothyroidism, unspecified: Secondary | ICD-10-CM | POA: Diagnosis not present

## 2023-05-21 DIAGNOSIS — Z940 Kidney transplant status: Secondary | ICD-10-CM | POA: Diagnosis not present

## 2023-05-21 DIAGNOSIS — N2581 Secondary hyperparathyroidism of renal origin: Secondary | ICD-10-CM | POA: Diagnosis not present

## 2023-05-21 DIAGNOSIS — Z1331 Encounter for screening for depression: Secondary | ICD-10-CM | POA: Diagnosis not present

## 2023-05-21 DIAGNOSIS — Z0000 Encounter for general adult medical examination without abnormal findings: Secondary | ICD-10-CM | POA: Diagnosis not present

## 2023-05-21 DIAGNOSIS — Z79899 Other long term (current) drug therapy: Secondary | ICD-10-CM | POA: Diagnosis not present

## 2023-05-21 DIAGNOSIS — Z23 Encounter for immunization: Secondary | ICD-10-CM | POA: Diagnosis not present

## 2023-05-21 DIAGNOSIS — I10 Essential (primary) hypertension: Secondary | ICD-10-CM | POA: Diagnosis not present

## 2023-05-21 DIAGNOSIS — E785 Hyperlipidemia, unspecified: Secondary | ICD-10-CM | POA: Diagnosis not present

## 2023-05-21 DIAGNOSIS — D631 Anemia in chronic kidney disease: Secondary | ICD-10-CM | POA: Diagnosis not present

## 2023-05-21 DIAGNOSIS — Z9181 History of falling: Secondary | ICD-10-CM | POA: Diagnosis not present

## 2023-06-04 DIAGNOSIS — Z940 Kidney transplant status: Secondary | ICD-10-CM | POA: Diagnosis not present

## 2023-06-04 DIAGNOSIS — Z79899 Other long term (current) drug therapy: Secondary | ICD-10-CM | POA: Diagnosis not present

## 2023-06-11 DIAGNOSIS — Z79899 Other long term (current) drug therapy: Secondary | ICD-10-CM | POA: Diagnosis not present

## 2023-06-11 DIAGNOSIS — Z940 Kidney transplant status: Secondary | ICD-10-CM | POA: Diagnosis not present

## 2023-06-11 DIAGNOSIS — I7782 Antineutrophilic cytoplasmic antibody (ANCA) vasculitis: Secondary | ICD-10-CM | POA: Diagnosis not present

## 2023-06-11 DIAGNOSIS — J849 Interstitial pulmonary disease, unspecified: Secondary | ICD-10-CM | POA: Diagnosis not present

## 2023-06-11 DIAGNOSIS — I428 Other cardiomyopathies: Secondary | ICD-10-CM | POA: Diagnosis not present

## 2023-06-18 DIAGNOSIS — Z940 Kidney transplant status: Secondary | ICD-10-CM | POA: Diagnosis not present

## 2023-06-18 DIAGNOSIS — Z79899 Other long term (current) drug therapy: Secondary | ICD-10-CM | POA: Diagnosis not present

## 2023-06-24 ENCOUNTER — Other Ambulatory Visit: Payer: Self-pay | Admitting: Internal Medicine

## 2023-06-24 DIAGNOSIS — M810 Age-related osteoporosis without current pathological fracture: Secondary | ICD-10-CM

## 2023-06-25 DIAGNOSIS — C44329 Squamous cell carcinoma of skin of other parts of face: Secondary | ICD-10-CM | POA: Diagnosis not present

## 2023-06-25 DIAGNOSIS — C44321 Squamous cell carcinoma of skin of nose: Secondary | ICD-10-CM | POA: Diagnosis not present

## 2023-06-25 DIAGNOSIS — D485 Neoplasm of uncertain behavior of skin: Secondary | ICD-10-CM | POA: Diagnosis not present

## 2023-06-25 DIAGNOSIS — Z85828 Personal history of other malignant neoplasm of skin: Secondary | ICD-10-CM | POA: Diagnosis not present

## 2023-07-02 DIAGNOSIS — Z79899 Other long term (current) drug therapy: Secondary | ICD-10-CM | POA: Diagnosis not present

## 2023-07-02 DIAGNOSIS — Z940 Kidney transplant status: Secondary | ICD-10-CM | POA: Diagnosis not present

## 2023-07-02 DIAGNOSIS — E039 Hypothyroidism, unspecified: Secondary | ICD-10-CM | POA: Diagnosis not present

## 2023-07-06 DIAGNOSIS — Z4789 Encounter for other orthopedic aftercare: Secondary | ICD-10-CM | POA: Diagnosis not present

## 2023-07-16 DIAGNOSIS — Z940 Kidney transplant status: Secondary | ICD-10-CM | POA: Diagnosis not present

## 2023-07-16 DIAGNOSIS — Z79899 Other long term (current) drug therapy: Secondary | ICD-10-CM | POA: Diagnosis not present

## 2023-07-20 DIAGNOSIS — C44321 Squamous cell carcinoma of skin of nose: Secondary | ICD-10-CM | POA: Diagnosis not present

## 2023-07-20 DIAGNOSIS — Z85828 Personal history of other malignant neoplasm of skin: Secondary | ICD-10-CM | POA: Diagnosis not present

## 2023-07-28 DIAGNOSIS — H02834 Dermatochalasis of left upper eyelid: Secondary | ICD-10-CM | POA: Diagnosis not present

## 2023-07-28 DIAGNOSIS — H02831 Dermatochalasis of right upper eyelid: Secondary | ICD-10-CM | POA: Diagnosis not present

## 2023-07-28 DIAGNOSIS — H353131 Nonexudative age-related macular degeneration, bilateral, early dry stage: Secondary | ICD-10-CM | POA: Diagnosis not present

## 2023-07-28 DIAGNOSIS — H31003 Unspecified chorioretinal scars, bilateral: Secondary | ICD-10-CM | POA: Diagnosis not present

## 2023-07-28 DIAGNOSIS — H524 Presbyopia: Secondary | ICD-10-CM | POA: Diagnosis not present

## 2023-07-28 DIAGNOSIS — H5203 Hypermetropia, bilateral: Secondary | ICD-10-CM | POA: Diagnosis not present

## 2023-07-29 DIAGNOSIS — Z79899 Other long term (current) drug therapy: Secondary | ICD-10-CM | POA: Diagnosis not present

## 2023-07-29 DIAGNOSIS — Z940 Kidney transplant status: Secondary | ICD-10-CM | POA: Diagnosis not present

## 2023-08-05 DIAGNOSIS — Z79899 Other long term (current) drug therapy: Secondary | ICD-10-CM | POA: Diagnosis not present

## 2023-08-05 DIAGNOSIS — Z940 Kidney transplant status: Secondary | ICD-10-CM | POA: Diagnosis not present

## 2023-08-05 DIAGNOSIS — Z792 Long term (current) use of antibiotics: Secondary | ICD-10-CM | POA: Diagnosis not present

## 2023-08-19 DIAGNOSIS — Z79899 Other long term (current) drug therapy: Secondary | ICD-10-CM | POA: Diagnosis not present

## 2023-08-19 DIAGNOSIS — Z940 Kidney transplant status: Secondary | ICD-10-CM | POA: Diagnosis not present

## 2023-09-02 DIAGNOSIS — Z79899 Other long term (current) drug therapy: Secondary | ICD-10-CM | POA: Diagnosis not present

## 2023-09-02 DIAGNOSIS — Z940 Kidney transplant status: Secondary | ICD-10-CM | POA: Diagnosis not present

## 2023-09-02 DIAGNOSIS — Z4789 Encounter for other orthopedic aftercare: Secondary | ICD-10-CM | POA: Diagnosis not present

## 2023-09-17 DIAGNOSIS — Z79899 Other long term (current) drug therapy: Secondary | ICD-10-CM | POA: Diagnosis not present

## 2023-09-17 DIAGNOSIS — Z940 Kidney transplant status: Secondary | ICD-10-CM | POA: Diagnosis not present

## 2023-10-01 DIAGNOSIS — Z940 Kidney transplant status: Secondary | ICD-10-CM | POA: Diagnosis not present

## 2023-10-01 DIAGNOSIS — Z79899 Other long term (current) drug therapy: Secondary | ICD-10-CM | POA: Diagnosis not present

## 2023-10-16 DIAGNOSIS — D84821 Immunodeficiency due to drugs: Secondary | ICD-10-CM | POA: Diagnosis not present

## 2023-10-16 DIAGNOSIS — Z940 Kidney transplant status: Secondary | ICD-10-CM | POA: Diagnosis not present

## 2023-10-21 DIAGNOSIS — Z85828 Personal history of other malignant neoplasm of skin: Secondary | ICD-10-CM | POA: Diagnosis not present

## 2023-10-21 DIAGNOSIS — D485 Neoplasm of uncertain behavior of skin: Secondary | ICD-10-CM | POA: Diagnosis not present

## 2023-10-21 DIAGNOSIS — D692 Other nonthrombocytopenic purpura: Secondary | ICD-10-CM | POA: Diagnosis not present

## 2023-10-21 DIAGNOSIS — D0471 Carcinoma in situ of skin of right lower limb, including hip: Secondary | ICD-10-CM | POA: Diagnosis not present

## 2023-10-21 DIAGNOSIS — C44719 Basal cell carcinoma of skin of left lower limb, including hip: Secondary | ICD-10-CM | POA: Diagnosis not present

## 2023-10-21 DIAGNOSIS — L905 Scar conditions and fibrosis of skin: Secondary | ICD-10-CM | POA: Diagnosis not present

## 2023-10-21 DIAGNOSIS — L821 Other seborrheic keratosis: Secondary | ICD-10-CM | POA: Diagnosis not present

## 2023-10-21 DIAGNOSIS — D225 Melanocytic nevi of trunk: Secondary | ICD-10-CM | POA: Diagnosis not present

## 2023-10-21 DIAGNOSIS — L570 Actinic keratosis: Secondary | ICD-10-CM | POA: Diagnosis not present

## 2023-10-30 DIAGNOSIS — Z940 Kidney transplant status: Secondary | ICD-10-CM | POA: Diagnosis not present

## 2023-10-30 DIAGNOSIS — Z79899 Other long term (current) drug therapy: Secondary | ICD-10-CM | POA: Diagnosis not present

## 2023-11-03 ENCOUNTER — Other Ambulatory Visit (HOSPITAL_COMMUNITY): Payer: Self-pay | Admitting: Nephrology

## 2023-11-03 DIAGNOSIS — K432 Incisional hernia without obstruction or gangrene: Secondary | ICD-10-CM

## 2023-11-06 ENCOUNTER — Ambulatory Visit (HOSPITAL_COMMUNITY)
Admission: RE | Admit: 2023-11-06 | Discharge: 2023-11-06 | Disposition: A | Discharge status: Home / Self Care | Payer: Medicare PPO | Source: Ambulatory Visit | Attending: Nephrology | Admitting: Nephrology

## 2023-11-06 DIAGNOSIS — K8020 Calculus of gallbladder without cholecystitis without obstruction: Secondary | ICD-10-CM | POA: Diagnosis not present

## 2023-11-06 DIAGNOSIS — K432 Incisional hernia without obstruction or gangrene: Secondary | ICD-10-CM | POA: Insufficient documentation

## 2023-11-06 DIAGNOSIS — K5730 Diverticulosis of large intestine without perforation or abscess without bleeding: Secondary | ICD-10-CM | POA: Diagnosis not present

## 2023-11-06 MED ORDER — IOHEXOL 300 MG/ML  SOLN
30.0000 mL | Freq: Once | INTRAMUSCULAR | Status: AC | PRN
  Administered 2023-11-06: 30 mL via ORAL

## 2023-11-09 DIAGNOSIS — Z85828 Personal history of other malignant neoplasm of skin: Secondary | ICD-10-CM | POA: Diagnosis not present

## 2023-11-09 DIAGNOSIS — L570 Actinic keratosis: Secondary | ICD-10-CM | POA: Diagnosis not present

## 2023-11-09 DIAGNOSIS — C44719 Basal cell carcinoma of skin of left lower limb, including hip: Secondary | ICD-10-CM | POA: Diagnosis not present

## 2023-11-09 DIAGNOSIS — D0471 Carcinoma in situ of skin of right lower limb, including hip: Secondary | ICD-10-CM | POA: Diagnosis not present

## 2023-11-09 DIAGNOSIS — D485 Neoplasm of uncertain behavior of skin: Secondary | ICD-10-CM | POA: Diagnosis not present

## 2023-11-20 DIAGNOSIS — J209 Acute bronchitis, unspecified: Secondary | ICD-10-CM | POA: Diagnosis not present

## 2023-11-20 DIAGNOSIS — J189 Pneumonia, unspecified organism: Secondary | ICD-10-CM | POA: Diagnosis not present

## 2023-11-20 DIAGNOSIS — R0602 Shortness of breath: Secondary | ICD-10-CM | POA: Diagnosis not present

## 2023-11-20 DIAGNOSIS — R051 Acute cough: Secondary | ICD-10-CM | POA: Diagnosis not present

## 2023-11-26 DIAGNOSIS — I5022 Chronic systolic (congestive) heart failure: Secondary | ICD-10-CM | POA: Diagnosis not present

## 2023-11-26 DIAGNOSIS — C4491 Basal cell carcinoma of skin, unspecified: Secondary | ICD-10-CM | POA: Diagnosis not present

## 2023-11-26 DIAGNOSIS — Z940 Kidney transplant status: Secondary | ICD-10-CM | POA: Diagnosis not present

## 2023-11-26 DIAGNOSIS — F3341 Major depressive disorder, recurrent, in partial remission: Secondary | ICD-10-CM | POA: Diagnosis not present

## 2023-11-26 DIAGNOSIS — I776 Arteritis, unspecified: Secondary | ICD-10-CM | POA: Diagnosis not present

## 2023-12-04 DIAGNOSIS — Z79899 Other long term (current) drug therapy: Secondary | ICD-10-CM | POA: Diagnosis not present

## 2023-12-04 DIAGNOSIS — Z940 Kidney transplant status: Secondary | ICD-10-CM | POA: Diagnosis not present

## 2023-12-07 ENCOUNTER — Ambulatory Visit (HOSPITAL_COMMUNITY): Payer: Medicare PPO | Attending: Cardiology

## 2023-12-07 DIAGNOSIS — I351 Nonrheumatic aortic (valve) insufficiency: Secondary | ICD-10-CM | POA: Diagnosis not present

## 2023-12-07 DIAGNOSIS — I340 Nonrheumatic mitral (valve) insufficiency: Secondary | ICD-10-CM | POA: Diagnosis not present

## 2023-12-07 LAB — ECHOCARDIOGRAM COMPLETE
Area-P 1/2: 3.34 cm2
P 1/2 time: 393 ms
S' Lateral: 2.1 cm

## 2023-12-31 ENCOUNTER — Ambulatory Visit: Attending: Cardiovascular Disease | Admitting: Cardiovascular Disease

## 2023-12-31 ENCOUNTER — Encounter: Payer: Self-pay | Admitting: Cardiovascular Disease

## 2023-12-31 VITALS — BP 128/70 | HR 71 | Resp 16 | Ht 66.0 in | Wt 159.6 lb

## 2023-12-31 DIAGNOSIS — I351 Nonrheumatic aortic (valve) insufficiency: Secondary | ICD-10-CM

## 2023-12-31 DIAGNOSIS — I5022 Chronic systolic (congestive) heart failure: Secondary | ICD-10-CM

## 2023-12-31 DIAGNOSIS — I340 Nonrheumatic mitral (valve) insufficiency: Secondary | ICD-10-CM | POA: Diagnosis not present

## 2023-12-31 DIAGNOSIS — I10 Essential (primary) hypertension: Secondary | ICD-10-CM

## 2023-12-31 NOTE — Progress Notes (Signed)
 Cardiology Office Note:    Date:  12/31/2023   ID:  Jasmine White, DOB December 25, 1945, MRN [id number]  PCP:  Lorenda Ishihara, MD   [facility] Providers Cardiologist:  Tonny Bollman, MD     Referring MD: Lorenda Ishihara,*   Chief Complaint  Patient presents with   Nonrheumatic mitral valve regurgitation   Follow-up    1 year    History of Present Illness:    Jasmine White is a 78 y.o. female with a hx of nonischemic cardiomyopathy with chronic HFrEF and moderate mitral regurgitation.  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  The patient is here with her husband today.  She has been doing well.  She does have exertional dyspnea and states this has been an issue ever since she had COVID several years ago and was diagnosed with interstitial lung disease.  She has no orthopnea, PND, or leg swelling.  She denies any exertional chest pain or pressure.  She had an echocardiogram prior to her visit that demonstrated normal LVEF, mild mitral regurgitation, and moderate aortic insufficiency.   Current Medications: Current Meds  Medication Sig   acetaminophen (TYLENOL) 325 MG tablet Take 650 mg by mouth every 4 (four) hours as needed.   albuterol (VENTOLIN HFA) 108 (90 Base) MCG/ACT inhaler Inhale 2 puffs into the lungs every 6 (six) hours as needed for wheezing or shortness of breath.   Ascorbic Acid (VITAMIN C) 1000 MG tablet Take 1,000 mg by mouth daily.   escitalopram (LEXAPRO) 20 MG tablet Take 20 mg by mouth at bedtime.   HYDROcodone-acetaminophen (NORCO/VICODIN) 5-325 MG tablet Take 1 tablet by mouth every 4 (four) hours as needed for moderate pain or severe pain.   loratadine (CLARITIN) 10 MG tablet Take 10 mg by mouth daily.   mycophenolate (CELLCEPT) 500 MG tablet Take 1,000 mg by mouth 2 (two) times daily.   predniSONE (DELTASONE) 5 MG tablet Take 5 mg by mouth  daily with breakfast.   sulfamethoxazole-trimethoprim (BACTRIM) 400-80 MG tablet Take 1 tablet by mouth daily.   SYNTHROID 88 MCG tablet TAKE 1 TABLET BY MOUTH ONCE DAILY BEFORE BREAKFAST (Patient taking differently: Take 88 mcg by mouth daily before breakfast.)   tacrolimus (PROGRAF) 1 MG capsule Take 2 mg by mouth 2 (two) times daily.   ZOFRAN 4 MG tablet Take 4 mg by mouth every 8 (eight) hours as needed for nausea or vomiting.     Allergies:   Hydrochlorothiazide, Nsaids, Other, Oxycodone, Percocet [oxycodone-acetaminophen], and Tramadol   ROS:   Please see the history of present illness.    All other systems reviewed and are negative.  EKGs/Labs/Other Studies Reviewed:    The following studies were reviewed today: Cardiac Studies & Procedures   ______________________________________________________________________________________________ CARDIAC CATHETERIZATION  CARDIAC CATHETERIZATION 01/17/2021  Narrative Images from the original result were not included. Jasmine White is a 78 y.o. female   [id number] LOCATION:  FACILITY: [facility] PHYSICIAN: Nanetta Batty, M.D. [birth year]   DATE OF PROCEDURE:  01/17/2021  DATE OF DISCHARGE:     CARDIAC CATHETERIZATION    History obtained from chart review.Ms. Oyer is a 78 year old woman admitted with new onset congestive heart failure.  She has an interesting history.  She reports that she was very healthy until 2020 when she developed COVID-19 pneumonia.  She used to walk 5 to 6 miles every day and work out at [employer].  She developed multiple  problems including ANCA positive interstitial lung disease and end-stage renal disease.  She reports that she has been on hemodialysis now for approximately 6 months.  The patient describes progressive fatigue.  She has developed shortness of breath at rest, orthopnea, and PND over the last 4 to 6 weeks.  She frequently has to sit up on the bedside at night and lean forward in order to  breathe well.  She feels a little bit better since undergoing back-to-back dialysis the past few days.  She had developed ankle swelling and this is now resolved.  She continues to experience orthopnea and PND with slight improvement.  She has felt a pressure in her chest at times, but no exertional angina.  The patient is a lifelong non-smoker.  During her hospitalization, she is found to have markedly elevated BNP.  She has small bilateral pleural effusions with mild interstitial edema.  An echocardiogram was performed today and demonstrates new LV dysfunction with an LVEF of 30 to 35%, moderately severe mitral regurgitation, and moderate aortic insufficiency.  She was referred for right left heart cath to define her anatomy and physiology.  Impression Ms. Dhanani has essentially normal coronary arteries and normal filling pressures.  She appears to be euvolemic.  She did not appear to have a large V wave although by echo she has severe mitral regurgitation.  Guideline directed optimal medical therapy will be recommended.  She may be a candidate for MitraClip which Dr. Excell Seltzer will address.  The right common femoral vein had Mynx closure successfully.  The arterial sheath will be pulled manually given the fact that there is very little subcutaneous tissue.  The patient left lab in stable condition.  Nanetta Batty. MD, [facility] 01/17/2021 11:26 AM  Findings Coronary Findings Diagnostic  Dominance: Right  No diagnostic findings have been documented. Intervention  No interventions have been documented.     ECHOCARDIOGRAM  ECHOCARDIOGRAM COMPLETE 12/07/2023  Narrative ECHOCARDIOGRAM REPORT    Patient Name:   Jasmine White Date of Exam: 12/07/2023 Medical Rec #:  [id number]       Height:       66.0 in Accession #:    [id number]      Weight:       151.3 lb Date of Birth:  15-Jan-1946       BSA:          1.776 m Patient Age:    65 years        BP:           139/81 mmHg Patient Gender: F                HR:           80 bpm. Exam Location:  [address]  Procedure: 2D Echo, 3D Echo, Cardiac Doppler and Color Doppler (Both Spectral and Color Flow Doppler were utilized during procedure).  Indications:    I34.0 mitral regurgitation  History:        Patient has prior history of Echocardiogram examinations, most recent 07/22/2022. Nonischemic cardiomypathy and CHF, Signs/Symptoms:Dyspnea; Risk Factors:Hypertension and HLD.  Sonographer:    Clearence Ped RCS Referring Phys: [id number] Jarika Robben  IMPRESSIONS   1. Left ventricular ejection fraction, by estimation, is 60 to 65%. Left ventricular ejection fraction by 3D volume is 62 %. The left ventricle has normal function. The left ventricle has no regional wall motion abnormalities. There is mild left ventricular hypertrophy of the septal segment. Left ventricular diastolic parameters are consistent with  Grade I diastolic dysfunction (impaired relaxation). 2. Right ventricular systolic function is normal. The right ventricular size is normal. There is normal pulmonary artery systolic pressure. 3. The mitral valve is normal in structure. Mild mitral valve regurgitation. No evidence of mitral stenosis. Moderate mitral annular calcification. 4. The aortic valve is normal in structure. There is moderate calcification of the aortic valve. There is mild thickening of the aortic valve. Aortic valve regurgitation is moderate. No aortic stenosis is present. 5. The inferior vena cava is normal in size with greater than 50% respiratory variability, suggesting right atrial pressure of 3 mmHg.  FINDINGS Left Ventricle: Left ventricular ejection fraction, by estimation, is 60 to 65%. Left ventricular ejection fraction by 3D volume is 62 %. The left ventricle has normal function. The left ventricle has no regional wall motion abnormalities. The left ventricular internal cavity size was normal in size. There is mild left ventricular hypertrophy of the  septal segment. Left ventricular diastolic parameters are consistent with Grade I diastolic dysfunction (impaired relaxation).  Right Ventricle: The right ventricular size is normal. No increase in right ventricular wall thickness. Right ventricular systolic function is normal. There is normal pulmonary artery systolic pressure. The tricuspid regurgitant velocity is 2.13 m/s, and with an assumed right atrial pressure of 3 mmHg, the estimated right ventricular systolic pressure is 21.1 mmHg.  Left Atrium: Left atrial size was normal in size.  Right Atrium: Right atrial size was normal in size.  Pericardium: There is no evidence of pericardial effusion.  Mitral Valve: The mitral valve is normal in structure. Moderate mitral annular calcification. Mild mitral valve regurgitation. No evidence of mitral valve stenosis.  Tricuspid Valve: The tricuspid valve is normal in structure. Tricuspid valve regurgitation is not demonstrated. No evidence of tricuspid stenosis.  Aortic Valve: The aortic valve is normal in structure. There is moderate calcification of the aortic valve. There is mild thickening of the aortic valve. Aortic valve regurgitation is moderate. Aortic regurgitation PHT measures 393 msec. No aortic stenosis is present.  Pulmonic Valve: The pulmonic valve was normal in structure. Pulmonic valve regurgitation is not visualized. No evidence of pulmonic stenosis.  Aorta: The aortic root is normal in size and structure.  Venous: The inferior vena cava is normal in size with greater than 50% respiratory variability, suggesting right atrial pressure of 3 mmHg.  IAS/Shunts: No atrial level shunt detected by color flow Doppler.  Additional Comments: 3D was performed not requiring image post processing on an independent workstation and was normal.   LEFT VENTRICLE PLAX 2D LVIDd:         3.40 cm         Diastology LVIDs:         2.10 cm         LV e' medial:    5.22 cm/s LV PW:          1.10 cm         LV E/e' medial:  19.2 LV IVS:        1.20 cm         LV e' lateral:   6.20 cm/s LVOT diam:     1.60 cm         LV E/e' lateral: 16.1 LV SV:         48 LV SV Index:   27 LVOT Area:     2.01 cm        3D Volume EF LV 3D EF:    Left ventricul ar  ejection fraction by 3D volume is 62 %.  3D Volume EF: 3D EF:        62 % LV EDV:       89 ml LV ESV:       34 ml LV SV:        55 ml  RIGHT VENTRICLE RV S prime:     12.10 cm/s TAPSE (M-mode): 2.0 cm RVSP:           21.1 mmHg  LEFT ATRIUM           Index        RIGHT ATRIUM           Index LA diam:      2.70 cm 1.52 cm/m   RA Pressure: 3.00 mmHg LA Vol (A4C): 23.6 ml 13.29 ml/m  RA Area:     8.08 cm RA Volume:   14.60 ml  8.22 ml/m AORTIC VALVE LVOT Vmax:   129.00 cm/s LVOT Vmean:  91.900 cm/s LVOT VTI:    0.241 m AI PHT:      393 msec  AORTA Ao Root diam: 2.80 cm Ao Asc diam:  3.70 cm  MITRAL VALVE                TRICUSPID VALVE MV Area (PHT):              TR Peak grad:   18.1 mmHg MV Decel Time:              TR Vmax:        213.00 cm/s MV E velocity: 100.00 cm/s  Estimated RAP:  3.00 mmHg MV A velocity: 167.00 cm/s  RVSP:           21.1 mmHg MV E/A ratio:  0.60 SHUNTS Systemic VTI:  0.24 m Systemic Diam: 1.60 cm  Donato Schultz MD Electronically signed by Donato Schultz MD Signature Date/Time: 12/07/2023/1:44:30 PM    Final          ______________________________________________________________________________________________      EKG:   EKG Interpretation Date/Time:  Thursday December 31 2023 15:30:42 EDT Ventricular Rate:  71 PR Interval:  180 QRS Duration:  72 QT Interval:  380 QTC Calculation: 412 R Axis:   -44  Text Interpretation: Normal sinus rhythm Possible Left atrial enlargement Left axis deviation Left ventricular hypertrophy ( R in aVL , Romhilt-Estes ) When compared with ECG of 15-Jan-2021 10:19, PREVIOUS ECG IS PRESENTthe QRS axis has shifted leftward Confirmed by Tonny Bollman [phone]) on 12/31/2023 3:49:48 PM    Recent Labs: 03/03/2023: Platelets 155 03/16/2023: BUN 25; Creatinine, Ser 0.70; Hemoglobin 12.2; Potassium 3.7; Sodium 137  Recent Lipid Panel    Component Value Date/Time   CHOL 130 01/17/2021 1020   CHOL 173 03/01/2020 0927   CHOL 169 12/21/2012 1552   TRIG 84 01/17/2021 1020   TRIG 97 12/26/2014 1150   TRIG 64 12/21/2012 1552   HDL 60 01/17/2021 1020   HDL 67 03/01/2020 0927   HDL 75 12/26/2014 1150   HDL 63 12/21/2012 1552   CHOLHDL 2.2 01/17/2021 1020   VLDL 17 01/17/2021 1020   LDLCALC 53 01/17/2021 1020   LDLCALC 94 03/01/2020 0927   LDLCALC 102 (H) 07/06/2014 0912   LDLCALC 93 12/21/2012 1552     Risk Assessment/Calculations:                Physical Exam:    VS:  BP 128/70 (BP Location: Left Arm, Patient Position: Sitting, Cuff  Size: Normal)   Pulse 71   Resp 16   Ht 5\' 6"  (1.676 m)   Wt 159 lb 9.6 oz (72.4 kg)   SpO2 96%   BMI 25.76 kg/m     Wt Readings from Last 3 Encounters:  12/31/23 159 lb 9.6 oz (72.4 kg)  03/31/23 151 lb 4.8 oz (68.6 kg)  03/16/23 146 lb 13.2 oz (66.6 kg)     GEN:  Well nourished, well developed in no acute distress HEENT: Normal NECK: No JVD; No carotid bruits LYMPHATICS: No lymphadenopathy CARDIAC: RRR, 2/6 systolic ejection murmur at the right upper sternal border RESPIRATORY:  Clear to auscultation without rales, wheezing or rhonchi  ABDOMEN: Soft, non-tender, non-distended MUSCULOSKELETAL:  No edema; No deformity  SKIN: Warm and dry NEUROLOGIC:  Alert and oriented x 3 PSYCHIATRIC:  Normal affect   Assessment & Plan Essential hypertension, benign Blood pressure is under optimal control.  Since her kidney transplantation she is not requiring any antihypertensive therapy. Chronic HFrEF (heart failure with reduced ejection fraction) (HCC) Chronic exertional dyspnea noted with NYHA functional class II symptoms.  Suspect this is multifactorial in she does not have any evidence  of volume excess on exam. Nonrheumatic mitral valve regurgitation Only mild mitral regurgitation on her most recent echo.  Continue annual echo follow-up and clinical evaluation. Nonrheumatic aortic valve insufficiency Moderate AI noted on recent echo.  Not audible on her exam.  Repeat echo 1 year.      Medication Adjustments/Labs and Tests Ordered: Current medicines are reviewed at length with the patient today.  Concerns regarding medicines are outlined above.  Orders Placed This Encounter  Procedures   EKG 12-Lead   No orders of the defined types were placed in this encounter.   There are no Patient Instructions on file for this visit.   Signed, Tonny Bollman, MD  12/31/2023 3:51 PM    [facility]

## 2023-12-31 NOTE — Assessment & Plan Note (Signed)
 Blood pressure is under optimal control.  Since her kidney transplantation she is not requiring any antihypertensive therapy.

## 2023-12-31 NOTE — Patient Instructions (Signed)
 Testing/Procedures: ECHO (next year, prior to appointment) Your physician has requested that you have an echocardiogram. Echocardiography is a painless test that uses sound waves to create images of your heart. It provides your doctor with information about the size and shape of your heart and how well your heart's chambers and valves are working. This procedure takes approximately one hour. There are no restrictions for this procedure. Please do NOT wear cologne, perfume, aftershave, or lotions (deodorant is allowed). Please arrive 15 minutes prior to your appointment time.  Please note: We ask at that you not bring children with you during ultrasound (echo/ vascular) testing. Due to room size and safety concerns, children are not allowed in the ultrasound rooms during exams. Our front office staff cannot provide observation of children in our lobby area while testing is being conducted. An adult accompanying a patient to their appointment will only be allowed in the ultrasound room at the discretion of the ultrasound technician under special circumstances. We apologize for any inconvenience.  Follow-Up: At [facility], you and your health needs are our priority.  As part of our continuing mission to provide you with exceptional heart care, our providers are all part of one team.  This team includes your primary Cardiologist (physician) and Advanced Practice Providers or APPs (Physician Assistants and Nurse Practitioners) who all work together to provide you with the care you need, when you need it.  Your next appointment:   1 year(s)  Provider:   Tonny Bollman, MD       1st Floor: - Lobby - Registration  - Pharmacy  - Lab - Cafe  2nd Floor: - PV Lab - Diagnostic Testing (echo, CT, nuclear med)  3rd Floor: - Vacant  4th Floor: - TCTS (cardiothoracic surgery) - AFib Clinic - Structural Heart Clinic - Vascular Surgery  - Vascular Ultrasound  5th Floor: - HeartCare  Cardiology (general and EP) - Clinical Pharmacy for coumadin, hypertension, lipid, weight-loss medications, and med management appointments    Valet parking services will be available as well.

## 2024-01-08 DIAGNOSIS — Z79899 Other long term (current) drug therapy: Secondary | ICD-10-CM | POA: Diagnosis not present

## 2024-01-08 DIAGNOSIS — Z940 Kidney transplant status: Secondary | ICD-10-CM | POA: Diagnosis not present

## 2024-01-12 DIAGNOSIS — D045 Carcinoma in situ of skin of trunk: Secondary | ICD-10-CM | POA: Diagnosis not present

## 2024-01-12 DIAGNOSIS — L821 Other seborrheic keratosis: Secondary | ICD-10-CM | POA: Diagnosis not present

## 2024-01-12 DIAGNOSIS — D485 Neoplasm of uncertain behavior of skin: Secondary | ICD-10-CM | POA: Diagnosis not present

## 2024-01-12 DIAGNOSIS — D692 Other nonthrombocytopenic purpura: Secondary | ICD-10-CM | POA: Diagnosis not present

## 2024-01-12 DIAGNOSIS — L570 Actinic keratosis: Secondary | ICD-10-CM | POA: Diagnosis not present

## 2024-01-12 DIAGNOSIS — D225 Melanocytic nevi of trunk: Secondary | ICD-10-CM | POA: Diagnosis not present

## 2024-01-12 DIAGNOSIS — L905 Scar conditions and fibrosis of skin: Secondary | ICD-10-CM | POA: Diagnosis not present

## 2024-01-12 DIAGNOSIS — Z85828 Personal history of other malignant neoplasm of skin: Secondary | ICD-10-CM | POA: Diagnosis not present

## 2024-01-15 DIAGNOSIS — Z940 Kidney transplant status: Secondary | ICD-10-CM | POA: Diagnosis not present

## 2024-02-05 DIAGNOSIS — E559 Vitamin D deficiency, unspecified: Secondary | ICD-10-CM | POA: Diagnosis not present

## 2024-02-05 DIAGNOSIS — R799 Abnormal finding of blood chemistry, unspecified: Secondary | ICD-10-CM | POA: Diagnosis not present

## 2024-02-05 DIAGNOSIS — Z940 Kidney transplant status: Secondary | ICD-10-CM | POA: Diagnosis not present

## 2024-02-05 DIAGNOSIS — Z79899 Other long term (current) drug therapy: Secondary | ICD-10-CM | POA: Diagnosis not present

## 2024-02-09 ENCOUNTER — Ambulatory Visit
Admission: RE | Admit: 2024-02-09 | Discharge: 2024-02-09 | Disposition: A | Discharge status: Home / Self Care | Payer: Medicare PPO | Source: Ambulatory Visit | Attending: Internal Medicine | Admitting: Internal Medicine

## 2024-02-09 ENCOUNTER — Other Ambulatory Visit: Payer: Self-pay | Admitting: Internal Medicine

## 2024-02-09 DIAGNOSIS — M810 Age-related osteoporosis without current pathological fracture: Secondary | ICD-10-CM

## 2024-02-09 DIAGNOSIS — N958 Other specified menopausal and perimenopausal disorders: Secondary | ICD-10-CM | POA: Diagnosis not present

## 2024-02-09 DIAGNOSIS — Z1231 Encounter for screening mammogram for malignant neoplasm of breast: Secondary | ICD-10-CM

## 2024-02-09 DIAGNOSIS — M8588 Other specified disorders of bone density and structure, other site: Secondary | ICD-10-CM | POA: Diagnosis not present

## 2024-02-25 ENCOUNTER — Ambulatory Visit
Admission: RE | Admit: 2024-02-25 | Discharge: 2024-02-25 | Disposition: A | Discharge status: Home / Self Care | Source: Ambulatory Visit | Attending: Internal Medicine | Admitting: Internal Medicine

## 2024-02-25 DIAGNOSIS — Z1231 Encounter for screening mammogram for malignant neoplasm of breast: Secondary | ICD-10-CM

## 2024-03-11 DIAGNOSIS — Z79899 Other long term (current) drug therapy: Secondary | ICD-10-CM | POA: Diagnosis not present

## 2024-03-11 DIAGNOSIS — E559 Vitamin D deficiency, unspecified: Secondary | ICD-10-CM | POA: Diagnosis not present

## 2024-03-11 DIAGNOSIS — Z940 Kidney transplant status: Secondary | ICD-10-CM | POA: Diagnosis not present

## 2024-03-11 DIAGNOSIS — R799 Abnormal finding of blood chemistry, unspecified: Secondary | ICD-10-CM | POA: Diagnosis not present

## 2024-04-08 DIAGNOSIS — E559 Vitamin D deficiency, unspecified: Secondary | ICD-10-CM | POA: Diagnosis not present

## 2024-04-08 DIAGNOSIS — Z79899 Other long term (current) drug therapy: Secondary | ICD-10-CM | POA: Diagnosis not present

## 2024-04-08 DIAGNOSIS — Z940 Kidney transplant status: Secondary | ICD-10-CM | POA: Diagnosis not present

## 2024-04-18 DIAGNOSIS — D849 Immunodeficiency, unspecified: Secondary | ICD-10-CM | POA: Diagnosis not present

## 2024-04-18 DIAGNOSIS — Z940 Kidney transplant status: Secondary | ICD-10-CM | POA: Diagnosis not present

## 2024-04-18 DIAGNOSIS — Z79899 Other long term (current) drug therapy: Secondary | ICD-10-CM | POA: Diagnosis not present

## 2024-04-21 DIAGNOSIS — D2261 Melanocytic nevi of right upper limb, including shoulder: Secondary | ICD-10-CM | POA: Diagnosis not present

## 2024-04-21 DIAGNOSIS — C44629 Squamous cell carcinoma of skin of left upper limb, including shoulder: Secondary | ICD-10-CM | POA: Diagnosis not present

## 2024-04-21 DIAGNOSIS — D485 Neoplasm of uncertain behavior of skin: Secondary | ICD-10-CM | POA: Diagnosis not present

## 2024-04-21 DIAGNOSIS — C44722 Squamous cell carcinoma of skin of right lower limb, including hip: Secondary | ICD-10-CM | POA: Diagnosis not present

## 2024-04-21 DIAGNOSIS — L570 Actinic keratosis: Secondary | ICD-10-CM | POA: Diagnosis not present

## 2024-04-21 DIAGNOSIS — Z85828 Personal history of other malignant neoplasm of skin: Secondary | ICD-10-CM | POA: Diagnosis not present

## 2024-04-21 DIAGNOSIS — D2262 Melanocytic nevi of left upper limb, including shoulder: Secondary | ICD-10-CM | POA: Diagnosis not present

## 2024-04-21 DIAGNOSIS — D692 Other nonthrombocytopenic purpura: Secondary | ICD-10-CM | POA: Diagnosis not present

## 2024-04-21 DIAGNOSIS — L821 Other seborrheic keratosis: Secondary | ICD-10-CM | POA: Diagnosis not present

## 2024-04-21 DIAGNOSIS — D225 Melanocytic nevi of trunk: Secondary | ICD-10-CM | POA: Diagnosis not present

## 2024-04-21 DIAGNOSIS — L905 Scar conditions and fibrosis of skin: Secondary | ICD-10-CM | POA: Diagnosis not present

## 2024-05-13 DIAGNOSIS — Z940 Kidney transplant status: Secondary | ICD-10-CM | POA: Diagnosis not present

## 2024-05-13 DIAGNOSIS — Z79899 Other long term (current) drug therapy: Secondary | ICD-10-CM | POA: Diagnosis not present

## 2024-05-13 DIAGNOSIS — E559 Vitamin D deficiency, unspecified: Secondary | ICD-10-CM | POA: Diagnosis not present

## 2024-05-26 DIAGNOSIS — C44629 Squamous cell carcinoma of skin of left upper limb, including shoulder: Secondary | ICD-10-CM | POA: Diagnosis not present

## 2024-05-26 DIAGNOSIS — L988 Other specified disorders of the skin and subcutaneous tissue: Secondary | ICD-10-CM | POA: Diagnosis not present

## 2024-05-26 DIAGNOSIS — Z85828 Personal history of other malignant neoplasm of skin: Secondary | ICD-10-CM | POA: Diagnosis not present

## 2024-05-30 DIAGNOSIS — Z136 Encounter for screening for cardiovascular disorders: Secondary | ICD-10-CM | POA: Diagnosis not present

## 2024-05-30 DIAGNOSIS — Z1331 Encounter for screening for depression: Secondary | ICD-10-CM | POA: Diagnosis not present

## 2024-05-30 DIAGNOSIS — C4491 Basal cell carcinoma of skin, unspecified: Secondary | ICD-10-CM | POA: Diagnosis not present

## 2024-05-30 DIAGNOSIS — I5022 Chronic systolic (congestive) heart failure: Secondary | ICD-10-CM | POA: Diagnosis not present

## 2024-05-30 DIAGNOSIS — Z0000 Encounter for general adult medical examination without abnormal findings: Secondary | ICD-10-CM | POA: Diagnosis not present

## 2024-05-30 DIAGNOSIS — E039 Hypothyroidism, unspecified: Secondary | ICD-10-CM | POA: Diagnosis not present

## 2024-05-30 DIAGNOSIS — F3341 Major depressive disorder, recurrent, in partial remission: Secondary | ICD-10-CM | POA: Diagnosis not present

## 2024-05-30 DIAGNOSIS — I776 Arteritis, unspecified: Secondary | ICD-10-CM | POA: Diagnosis not present

## 2024-05-30 DIAGNOSIS — Z940 Kidney transplant status: Secondary | ICD-10-CM | POA: Diagnosis not present

## 2024-05-30 DIAGNOSIS — M85859 Other specified disorders of bone density and structure, unspecified thigh: Secondary | ICD-10-CM | POA: Diagnosis not present

## 2024-05-30 DIAGNOSIS — G894 Chronic pain syndrome: Secondary | ICD-10-CM | POA: Diagnosis not present

## 2024-06-10 DIAGNOSIS — Z940 Kidney transplant status: Secondary | ICD-10-CM | POA: Diagnosis not present

## 2024-06-10 DIAGNOSIS — E559 Vitamin D deficiency, unspecified: Secondary | ICD-10-CM | POA: Diagnosis not present

## 2024-06-10 DIAGNOSIS — Z79899 Other long term (current) drug therapy: Secondary | ICD-10-CM | POA: Diagnosis not present

## 2024-07-06 DIAGNOSIS — Z940 Kidney transplant status: Secondary | ICD-10-CM | POA: Diagnosis not present

## 2024-07-06 DIAGNOSIS — I7782 Antineutrophilic cytoplasmic antibody (ANCA) vasculitis: Secondary | ICD-10-CM | POA: Diagnosis not present

## 2024-07-06 DIAGNOSIS — J849 Interstitial pulmonary disease, unspecified: Secondary | ICD-10-CM | POA: Diagnosis not present

## 2024-07-08 DIAGNOSIS — E559 Vitamin D deficiency, unspecified: Secondary | ICD-10-CM | POA: Diagnosis not present

## 2024-07-08 DIAGNOSIS — Z79899 Other long term (current) drug therapy: Secondary | ICD-10-CM | POA: Diagnosis not present

## 2024-07-08 DIAGNOSIS — Z940 Kidney transplant status: Secondary | ICD-10-CM | POA: Diagnosis not present

## 2024-07-14 DIAGNOSIS — D849 Immunodeficiency, unspecified: Secondary | ICD-10-CM | POA: Diagnosis not present

## 2024-07-14 DIAGNOSIS — N2581 Secondary hyperparathyroidism of renal origin: Secondary | ICD-10-CM | POA: Diagnosis not present

## 2024-07-14 DIAGNOSIS — Z940 Kidney transplant status: Secondary | ICD-10-CM | POA: Diagnosis not present

## 2024-08-01 DIAGNOSIS — H353132 Nonexudative age-related macular degeneration, bilateral, intermediate dry stage: Secondary | ICD-10-CM | POA: Diagnosis not present

## 2024-08-01 DIAGNOSIS — H52203 Unspecified astigmatism, bilateral: Secondary | ICD-10-CM | POA: Diagnosis not present

## 2024-08-01 DIAGNOSIS — H02831 Dermatochalasis of right upper eyelid: Secondary | ICD-10-CM | POA: Diagnosis not present

## 2024-08-01 DIAGNOSIS — H25813 Combined forms of age-related cataract, bilateral: Secondary | ICD-10-CM | POA: Diagnosis not present

## 2024-08-01 DIAGNOSIS — H02834 Dermatochalasis of left upper eyelid: Secondary | ICD-10-CM | POA: Diagnosis not present

## 2024-08-12 DIAGNOSIS — R799 Abnormal finding of blood chemistry, unspecified: Secondary | ICD-10-CM | POA: Diagnosis not present

## 2024-08-12 DIAGNOSIS — Z940 Kidney transplant status: Secondary | ICD-10-CM | POA: Diagnosis not present

## 2024-09-06 ENCOUNTER — Other Ambulatory Visit

## 2024-09-06 ENCOUNTER — Ambulatory Visit: Admitting: "Endocrinology

## 2024-09-06 ENCOUNTER — Encounter: Payer: Self-pay | Admitting: "Endocrinology

## 2024-09-06 NOTE — Progress Notes (Signed)
 Outpatient Endocrinology Note Jasmine Birmingham, MD    Jasmine White Feb 12, 1946 [id number]  Referring Provider: Margaretha Hargis Norris, FNP Primary Care Provider: Elliot Charm, MD Reason for consultation: Subjective   Assessment & Plan  Diagnoses and all orders for this visit:  Hypercalcemia -     PTH, intact and calcium  -     Renal function panel -     Vitamin D  1,25 dihydroxy -     Magnesium    08/12/24 corrected calcium  at 10 (10.3 on 07/08/24, 10.4 on 06/10/24)) Patient's calcium  hovers around upper limit of normal but has been higher than normal in the past 06/2024 25 Vit D WNL 05/20/23 PTH was normal at 85, upper limit being 110 02/09/24: DXA showered T-score of-1.5 at L femoral neck Ordered baseline work up, will follow up 24 hour urine calcium  if indicated  Return in about 6 months (around 03/07/2025) for visit + labs before next visit, labs today.   I have reviewed current medications, nurse's notes, allergies, vital signs, past medical and surgical history, family medical history, and social history for this encounter. Counseled patient on symptoms, examination findings, lab findings, imaging results, treatment decisions and monitoring and prognosis. The patient understood the recommendations and agrees with the treatment plan. All questions regarding treatment plan were fully answered.  Jasmine Birmingham, MD  09/06/2024   History of Present Illness HPI  Jasmine White is a 78 y.o. female referred by Dr. Margaretha for evaluation and management of hypercalcemia.    S/p renal transplant in 07/2022 in [place]  Patient denies a history of kidney stones; son also has kidney stones She  current hematuria No polyuria No nocturia Yes, 1/night  thirst No renal failure No; S/p renal transplant in 07/2022 in [place] anorexia No abdominal pain No heartburn No constipation No nausea or vomiting No history of peptic ulcer disease  No depression Yes, on Lexapro  and Wellbutrin  confusion No excessive fatigue Yes, + breathing issues  fracture Yes, broke R arm-fell from a height osteoporosis No (02/09/24: DXA showered T-score of -1.5 at L femoral neck) headaches Yes numbness No tingling No  She takes Calcium  No She takes Vitamin D  supplements No, stopped it around 06/2024 She a history of taking chronic lithium No She a recent history of thiazide diuretic intake No  She family history of hypercalcemia No a personal history of MEN syndromes/medullary thyroid  cancer/ pheochromocytoma No  Physical Exam  BP 102/80   Pulse 75   Ht 5' 8 (1.727 m)   Wt 158 lb (71.7 kg)   SpO2 98%   BMI 24.02 kg/m    Constitutional: well developed, well nourished Head: normocephalic, atraumatic Eyes: sclera anicteric, no redness Neck: supple Lungs: normal respiratory effort Neurology: alert and oriented Skin: dry, no appreciable rashes Musculoskeletal: no appreciable defects Psychiatric: normal mood and affect   Current Medications Patient's Medications  New Prescriptions   No medications on file  Previous Medications   ACETAMINOPHEN  (TYLENOL ) 325 MG TABLET    Take 650 mg by mouth every 4 (four) hours as needed.   ALBUTEROL  (VENTOLIN  HFA) 108 (90 BASE) MCG/ACT INHALER    Inhale 2 puffs into the lungs every 6 (six) hours as needed for wheezing or shortness of breath.   ASCORBIC ACID (VITAMIN C) 1000 MG TABLET    Take 1,000 mg by mouth daily.   BUPROPION (WELLBUTRIN XL) 150 MG 24 HR TABLET    Take 150 mg by mouth.   ESCITALOPRAM  (LEXAPRO ) 20 MG TABLET  Take 20 mg by mouth at bedtime.   HYDROCODONE -ACETAMINOPHEN  (NORCO/VICODIN) 5-325 MG TABLET    Take 1 tablet by mouth every 4 (four) hours as needed for moderate pain or severe pain.   LORATADINE (CLARITIN) 10 MG TABLET    Take 10 mg by mouth daily.   MYCOPHENOLATE  (CELLCEPT) 500 MG TABLET    Take 1,000 mg by mouth 2 (two) times daily.   PREDNISONE  (DELTASONE ) 5 MG TABLET     Take 5 mg by mouth daily with breakfast.   SULFAMETHOXAZOLE -TRIMETHOPRIM  (BACTRIM ) 400-80 MG TABLET    Take 1 tablet by mouth daily.   SYNTHROID  88 MCG TABLET    TAKE 1 TABLET BY MOUTH ONCE DAILY BEFORE BREAKFAST   TACROLIMUS  (PROGRAF ) 1 MG CAPSULE    Take 2 mg by mouth 2 (two) times daily.   ZOFRAN  4 MG TABLET    Take 4 mg by mouth every 8 (eight) hours as needed for nausea or vomiting.  Modified Medications   No medications on file  Discontinued Medications   No medications on file    Allergies Allergies[1]  Past Medical History Past Medical History:  Diagnosis Date   Arthritis    Bruises easily    Cancer (HCC)    basal skin cancer   Chronic back pain    HNP/stenosis and radiculopathy   Chronic kidney disease    from Covid, was on dialysis, now has a transplant.   Depression    Heart murmur    History of iron deficiency anemia    Hyperlipidemia    takes Pravastatin  daily   Hypertension    history of   Hypothyroidism    takes Synthroid  daily   Insomnia    takes Elevil nightly   Joint pain    Joint swelling    Low BP    past some sedation   Nocturia    Pneumonia    09/2021   PONV (postoperative nausea and vomiting)    with knee replacement b/p dropped   Sinusitis    finished zpak yesterday   Urinary frequency     Past Surgical History Past Surgical History:  Procedure Laterality Date   A/V FISTULAGRAM N/A 05/17/2020   Procedure: A/V FISTULAGRAM - Left Arm;  Surgeon: Gretta Lonni PARAS, MD;  Location: [facility];  Service: Cardiovascular;  Laterality: N/A;   ABDOMINAL HYSTERECTOMY     AV FISTULA PLACEMENT Left 04/02/2020   Procedure: ARTERIOVENOUS (AV) FISTULA CREATION;  Surgeon: Oris Krystal FALCON, MD;  Location: [facility] OR;  Service: Vascular;  Laterality: Left;   AVGG REMOVAL Left 03/16/2023   Procedure: EXCISION OF LEFT ARM ARTERIOVENOUS FISTULA;  Surgeon: Magda Debby SAILOR, MD;  Location: [facility] OR;  Service: Vascular;  Laterality: Left;   BACK SURGERY      basal cell skin cancer     on face and forehead   bil knee replacements     BREAST BIOPSY     left breast/benign   COLONOSCOPY     IR FLUORO GUIDE CV LINE RIGHT  03/20/2020   IR US  GUIDE VASC ACCESS RIGHT  03/20/2020   KIDNEY TRANSPLANT     08/2022   LUMBAR LAMINECTOMY/DECOMPRESSION MICRODISCECTOMY  08/13/2012   Procedure: LUMBAR LAMINECTOMY/DECOMPRESSION MICRODISCECTOMY 1 LEVEL;  Surgeon: Fairy Levels, MD;  Location: [facility];  Service: Neurosurgery;  Laterality: Left;  Left lumbar one-two Microdiskectomy   ORIF HUMERUS FRACTURE Right 03/03/2023   Procedure: Open reduction internal fixation right humerus with radial nerve neuroplasty;  Surgeon: Camella,  Elsie, MD;  Location: [facility];  Service: Orthopedics;  Laterality: Right;  2 hrs   PERIPHERAL VASCULAR BALLOON ANGIOPLASTY Left 05/17/2020   Procedure: PERIPHERAL VASCULAR BALLOON ANGIOPLASTY;  Surgeon: Gretta Lonni PARAS, MD;  Location: [facility];  Service: Cardiovascular;  Laterality: Left;  Arm fistula    RIGHT/LEFT HEART CATH AND CORONARY ANGIOGRAPHY N/A 01/17/2021   Procedure: RIGHT/LEFT HEART CATH AND CORONARY ANGIOGRAPHY;  Surgeon: Court Dorn PARAS, MD;  Location: [facility];  Service: Cardiovascular;  Laterality: N/A;    Family History family history includes Alcohol abuse in her father; Arthritis in her brother and brother; Cancer (age of onset: 11) in her brother; Heart disease in her father and mother; Hypertension in her mother; Stroke in her father.  Social History Social History   Socioeconomic History   Marital status: Married    Spouse name: Ray   Number of children: 2   Years of education: Not on file   Highest education level: Not on file  Occupational History   Occupation: retired  Tobacco Use   Smoking status: Never   Smokeless tobacco: Never  Vaping Use   Vaping status: Never Used  Substance and Sexual Activity   Alcohol use: No    Alcohol/week: 0.0 standard drinks of alcohol   Drug  use: No   Sexual activity: Not Currently  Other Topics Concern   Not on file  Social History Narrative   Not on file   Social Drivers of Health   Tobacco Use: Low Risk (09/06/2024)   Patient History    Smoking Tobacco Use: Never    Smokeless Tobacco Use: Never    Passive Exposure: Not on file  Financial Resource Strain: Not on file  Food Insecurity: No Food Insecurity (07/03/2022)   Hunger Vital Sign    Worried About Running Out of Food in the Last Year: Never true    Ran Out of Food in the Last Year: Never true  Transportation Needs: No Transportation Needs (07/03/2022)   PRAPARE - Administrator, Civil Service (Medical): No    Lack of Transportation (Non-Medical): No  Physical Activity: Not on file  Stress: Not on file  Social Connections: Not on file  Intimate Partner Violence: Not on file  Depression (EYV7-0): Not on file  Alcohol Screen: Not on file  Housing: Low Risk (08/07/2022)   Received from [facility]   Epic    What is your living situation today?: I have a steady place to live    Think about the place you live. Do you have problems with any of the following? Choose all that apply:: Not on file  Utilities: Not on file  Health Literacy: Not on file    Lab Results  Component Value Date   CHOL 130 01/17/2021   Lab Results  Component Value Date   HDL 60 01/17/2021   Lab Results  Component Value Date   LDLCALC 53 01/17/2021   Lab Results  Component Value Date   TRIG 84 01/17/2021   Lab Results  Component Value Date   CHOLHDL 2.2 01/17/2021   Lab Results  Component Value Date   CREATININE 0.70 03/16/2023   Lab Results  Component Value Date   GFR 14.24 (LL) 12/12/2020      Component Value Date/Time   NA 137 03/16/2023 0614   NA 128 (L) 03/01/2020 0927   K 3.7 03/16/2023 0614   CL 103 03/16/2023 0614   CO2 25 03/03/2023 1200  GLUCOSE 110 (H) 03/16/2023 0614   BUN 25 (H) 03/16/2023 0614   BUN 67 (H) 03/01/2020 0927    CREATININE 0.70 03/16/2023 0614   CREATININE 0.84 12/21/2012 1552   CALCIUM  9.8 03/03/2023 1200   PROT 6.7 01/15/2021 1054   PROT 7.4 03/01/2020 0927   ALBUMIN  3.8 01/15/2021 1054   ALBUMIN  3.9 03/01/2020 0927   AST 51 (H) 01/15/2021 1054   ALT 25 01/15/2021 1054   ALKPHOS 138 (H) 01/15/2021 1054   BILITOT 1.1 01/15/2021 1054   BILITOT 0.3 03/01/2020 0927   GFRNONAA >60 03/03/2023 1200   GFRNONAA 72 12/21/2012 1552   GFRAA 13 (L) 05/03/2020 0032   GFRAA 83 12/21/2012 1552      Latest Ref Rng & Units 03/16/2023    6:14 AM 03/03/2023   12:00 PM 01/17/2021   11:02 AM  BMP  Glucose 70 - 99 mg/dL 889  858    BUN 8 - 23 mg/dL 25  18    Creatinine 9.55 - 1.00 mg/dL 9.29  9.27    Sodium 864 - 145 mmol/L 137  135  138   Potassium 3.5 - 5.1 mmol/L 3.7  4.2  3.6   Chloride 98 - 111 mmol/L 103  98    CO2 22 - 32 mmol/L  25    Calcium  8.9 - 10.3 mg/dL  9.8         Component Value Date/Time   WBC 7.0 03/03/2023 1200   RBC 4.04 03/03/2023 1200   HGB 12.2 03/16/2023 0614   HGB 9.7 (L) 03/01/2020 0927   HCT 36.0 03/16/2023 0614   HCT 28.8 (L) 03/01/2020 0927   PLT 155 03/03/2023 1200   PLT 397 03/01/2020 0927   MCV 95.3 03/03/2023 1200   MCV 79 03/01/2020 0927   MCH 30.0 03/03/2023 1200   MCHC 31.4 03/03/2023 1200   RDW 13.2 03/03/2023 1200   RDW 14.7 03/01/2020 0927   LYMPHSABS 1.2 01/15/2021 1054   LYMPHSABS 1.3 03/01/2020 0927   MONOABS 0.5 01/15/2021 1054   EOSABS 0.1 01/15/2021 1054   EOSABS 0.2 03/01/2020 0927   BASOSABS 0.1 01/15/2021 1054   BASOSABS 0.1 03/01/2020 9072   Lab Results  Component Value Date   TSH 1.295 01/15/2021   TSH 1.093 03/18/2020   TSH 1.209 03/17/2020         Parts of this note may have been dictated using voice recognition software. There may be variances in spelling and vocabulary which are unintentional. Not all errors are proofread. Please notify the dino if any discrepancies are noted or if the meaning of any statement is not clear.       [1]  Allergies Allergen Reactions   Hydrochlorothiazide  Other (See Comments)    Hx of severe Hyponatremia while taking HCTZ - now contraindicated Other reaction(s): low sodium, Other (See Comments) Hx of severe Hyponatremia while taking HCTZ - now contraindicated Hx of severe Hyponatremia while taking HCTZ - now contraindicated    Nsaids Other (See Comments)    Renal patient   Other Other (See Comments)    If ever given strong pain medication, the patient will require something to counteract the nausea   Oxycodone  Other (See Comments)   Percocet [Oxycodone -Acetaminophen ] Nausea Only   Tramadol Nausea Only and Other (See Comments)    Patient remarked this made her feel crazy (also) Other reaction(s): nausea, Nausea And Vomiting, Nausea Only Patient remarked this made her feel crazy (also)

## 2024-09-09 LAB — RENAL FUNCTION PANEL
Albumin: 4.3 g/dL (ref 3.6–5.1)
BUN: 24 mg/dL (ref 7–25)
CO2: 29 mmol/L (ref 20–32)
Calcium: 10.3 mg/dL (ref 8.6–10.4)
Chloride: 101 mmol/L (ref 98–110)
Creat: 0.71 mg/dL (ref 0.60–1.00)
Glucose, Bld: 208 mg/dL — ABNORMAL HIGH (ref 65–99)
Phosphorus: 3.2 mg/dL (ref 2.1–4.3)
Potassium: 4.3 mmol/L (ref 3.5–5.3)
Sodium: 138 mmol/L (ref 135–146)

## 2024-09-09 LAB — VITAMIN D 1,25 DIHYDROXY
Vitamin D 1, 25 (OH)2 Total: 93 pg/mL — ABNORMAL HIGH (ref 18–72)
Vitamin D2 1, 25 (OH)2: 13 pg/mL
Vitamin D3 1, 25 (OH)2: 80 pg/mL

## 2024-09-09 LAB — PTH, INTACT AND CALCIUM
Calcium: 10.3 mg/dL (ref 8.6–10.4)
PTH: 76 pg/mL (ref 16–77)

## 2024-09-09 LAB — MAGNESIUM: Magnesium: 1.9 mg/dL (ref 1.5–2.5)

## 2024-10-05 ENCOUNTER — Encounter (HOSPITAL_COMMUNITY): Payer: Self-pay | Admitting: Nephrology

## 2024-10-06 ENCOUNTER — Other Ambulatory Visit (HOSPITAL_COMMUNITY): Payer: Self-pay | Admitting: Nephrology

## 2024-10-06 DIAGNOSIS — Z940 Kidney transplant status: Secondary | ICD-10-CM

## 2024-10-17 ENCOUNTER — Encounter (HOSPITAL_COMMUNITY)
Admission: RE | Admit: 2024-10-17 | Discharge: 2024-10-17 | Disposition: A | Discharge status: Home / Self Care | Source: Ambulatory Visit | Attending: Nephrology | Admitting: Nephrology

## 2024-10-17 ENCOUNTER — Encounter (HOSPITAL_COMMUNITY): Admission: RE | Admit: 2024-10-17 | Source: Ambulatory Visit

## 2024-10-17 ENCOUNTER — Encounter (HOSPITAL_COMMUNITY)

## 2024-10-17 DIAGNOSIS — Z940 Kidney transplant status: Secondary | ICD-10-CM | POA: Diagnosis present

## 2024-10-17 MED ORDER — TECHNETIUM TC 99M SESTAMIBI - CARDIOLITE
24.8000 | Freq: Once | INTRAVENOUS | Status: AC
  Administered 2024-10-17: 24.8 via INTRAVENOUS

## 2025-02-28 ENCOUNTER — Other Ambulatory Visit

## 2025-03-07 ENCOUNTER — Ambulatory Visit: Admitting: "Endocrinology
